# Patient Record
Sex: Female | Born: 1975
Health system: Southern US, Community
[De-identification: ages and names within clinical notes are randomized; demographics above are authoritative.]

## PROBLEM LIST (undated history)

## (undated) DIAGNOSIS — R011 Cardiac murmur, unspecified: Secondary | ICD-10-CM

## (undated) DIAGNOSIS — E785 Hyperlipidemia, unspecified: Secondary | ICD-10-CM

## (undated) DIAGNOSIS — E119 Type 2 diabetes mellitus without complications: Secondary | ICD-10-CM

## (undated) DIAGNOSIS — I509 Heart failure, unspecified: Secondary | ICD-10-CM

## (undated) DIAGNOSIS — E669 Obesity, unspecified: Secondary | ICD-10-CM

## (undated) DIAGNOSIS — D509 Iron deficiency anemia, unspecified: Secondary | ICD-10-CM

## (undated) DIAGNOSIS — I2510 Atherosclerotic heart disease of native coronary artery without angina pectoris: Secondary | ICD-10-CM

## (undated) DIAGNOSIS — I10 Essential (primary) hypertension: Secondary | ICD-10-CM

## (undated) HISTORY — DX: Hyperlipidemia, unspecified: E78.5

## (undated) HISTORY — DX: Type 2 diabetes mellitus without complications: E11.9

## (undated) HISTORY — DX: Essential (primary) hypertension: I10

## (undated) HISTORY — DX: Iron deficiency anemia, unspecified: D50.9

## (undated) HISTORY — DX: Obesity, unspecified: E66.9

## (undated) HISTORY — PX: ORIF FEMUR FRACTURE: SHX2119

---

## 2006-03-04 ENCOUNTER — Ambulatory Visit: Payer: Self-pay | Admitting: Hospitalist

## 2006-03-06 ENCOUNTER — Ambulatory Visit: Payer: Self-pay | Admitting: Internal Medicine

## 2012-08-20 ENCOUNTER — Ambulatory Visit (INDEPENDENT_AMBULATORY_CARE_PROVIDER_SITE_OTHER): Payer: BC Managed Care – PPO | Admitting: Medical

## 2012-08-20 ENCOUNTER — Encounter: Payer: Self-pay | Admitting: Medical

## 2012-08-20 VITALS — BP 118/80 | HR 88 | Temp 98.4°F | Resp 16 | Wt 270.0 lb

## 2012-08-20 DIAGNOSIS — R509 Fever, unspecified: Secondary | ICD-10-CM

## 2012-08-20 DIAGNOSIS — R52 Pain, unspecified: Secondary | ICD-10-CM

## 2012-08-20 DIAGNOSIS — R05 Cough: Secondary | ICD-10-CM

## 2012-08-20 DIAGNOSIS — R062 Wheezing: Secondary | ICD-10-CM

## 2012-08-20 MED ORDER — ALBUTEROL SULFATE HFA 108 (90 BASE) MCG/ACT IN AERS: 2.0000 | INHALATION_SPRAY | Freq: Four times a day (QID) | RESPIRATORY_TRACT | Status: DC | PRN

## 2012-08-20 MED ORDER — LEVOFLOXACIN 500 MG PO TABS: 500.0000 mg | ORAL_TABLET | Freq: Every day | ORAL | Status: DC

## 2012-08-20 MED ORDER — HYDROCODONE-HOMATROPINE 5-1.5 MG/5ML PO SYRP: 5.0000 mL | ORAL_SOLUTION | Freq: Four times a day (QID) | ORAL | Status: DC | PRN

## 2012-08-20 NOTE — Progress Notes (Signed)
Subjective: Here as a new patient for acute illness.  Been sick for 4 days.  She reports feeling hot then cold, head and throat hurts, coughing, lost voice, ears and throat hurt some, body aches, head has stopped hurting, can barely breath out nose, but cough is horrible, hurts back of her throat.  No appetite.  Started out with fever, but not now.   Some vomiting from coughing so hard.  Denies sinus pressure.  Some abdominal pain from coughing.  Denies back pain.  Daughter is sick but she caught it from her.  Used 1 dose of Dayquil, but nothing else.  No other aggravating or relieving factors.  She did not get a flu shot this year.   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  Normally sees Dr. Ocie Bob ,but they couldn't get her in today.  Denies hx/o asthma or lung disease, nonsmoker.  No other c/o.   Objective: Gen: wd, wn, nad, AA obese female, somewhat ill appearing,vitals stable, afebrile Skin: warm, dry Heent: no sinus tenderness, TMs pearly, nares with purulent discharge, pharynx normal Lungs: upper inspiratory wheezes bilat, otherwise decreased breath sounds, no rhonchi or rales Heart: RRR, normal S1, S2, no murmur  Assessment: Encounter Diagnoses  Name Primary?  . Fever Yes  . Wheezing   . Cough   . Body aches     Plan: Symptoms suggest flu like illness, but at this point she may be working towards secondary pulmonary and sinus infection.   Patient Instructions  Begin Mucinex DM or similar generic OTC for mucous and cough.  This shouldn't raise the blood pressure.  Another alternative is Coricidin HBP.  I also prescribed Hycodan cough syrup today for worse cough that you can use at bedtime. Just don't use at the same time as the OTC cough medications.    I prescribed Albuterol inhaler for wheezing, tightness and shortness of breath.  You can use this, 2 puffs every 4- 6 hours if needed.    Rest, drink plenty of fluids such as water, stay out of the cold,  stay away from tobacco smoke.    If you are worse over the weekend, with worse chest congestion, fever, or not improving, then begin antibiotic Levaquin.  If you begin the antibiotic, its one tablet daily for 1 week.    Recheck next week if not improving.

## 2012-08-20 NOTE — Patient Instructions (Signed)
Begin Mucinex DM or similar generic OTC for mucous and cough.  This shouldn't raise the blood pressure.  Another alternative is Coricidin HBP.  I also prescribed Hycodan cough syrup today for worse cough that you can use at bedtime. Just don't use at the same time as the OTC cough medications.    I prescribed Albuterol inhaler for wheezing, tightness and shortness of breath.  You can use this, 2 puffs every 4- 6 hours if needed.    Rest, drink plenty of fluids such as water, stay out of the cold, stay away from tobacco smoke.    If you are worse over the weekend, with worse chest congestion, fever, or not improving, then begin antibiotic Levaquin.  If you begin the antibiotic, its one tablet daily for 1 week.    Recheck next week if not improving.

## 2012-10-08 ENCOUNTER — Other Ambulatory Visit: Payer: Self-pay | Admitting: Medical

## 2012-10-08 NOTE — Telephone Encounter (Signed)
PATIENT NEEDS A OFFICE VISIT TO FOLLOW UP ON BLOOD PRESSURE.

## 2012-10-13 ENCOUNTER — Encounter: Payer: BC Managed Care – PPO | Admitting: Medical

## 2012-10-20 ENCOUNTER — Encounter: Payer: BC Managed Care – PPO | Admitting: Medical

## 2012-11-11 ENCOUNTER — Telehealth: Payer: Self-pay | Admitting: Internal Medicine

## 2012-11-11 NOTE — Telephone Encounter (Signed)
Called pt and left voicemail for pt to call me back also re faxed medical records form

## 2012-11-11 NOTE — Telephone Encounter (Signed)
Noted thanks °

## 2012-11-11 NOTE — Telephone Encounter (Signed)
Message copied by Joslyn Hy on Thu Nov 11, 2012  3:19 PM ------      Message from: KNAPP, EVE      Created: Wed Nov 10, 2012 10:13 PM      Regarding: records       Navarro Nine--I see this pt on my schedule for next week.  She has seen Vincenza Hews once for an acute visit only.  Per his note, she was under are of Dr. Parke Simmers, and only came here because she couldn't get in with her (for a sick visit).  It appears that she called here for refill on BP med in Feb, and was asked to schedule appt.  Looks like she no showed an appt with Vincenza Hews, and now has an appt with me for next week.  Please see if we have received records from Dr. Parke Simmers (as this is essentially an establish care visit with me, not sure why she is on my schedule), and verify that she plans on coming, since she has already no showed a visit at our office.  She is on a cholesterol-lowering medication.  If she hasn't had labs in over 6 months, she might want to come fasting (nothing to eat for 8 hours) vs coming in earlier that morning to have blood drawn while fasting and held until orders placed at her.            Thanks ------

## 2012-11-11 NOTE — Telephone Encounter (Signed)
Dr. Lynelle Doctor medical records were signed back in January and still have not gotten them, i have refaxed them again today. Pt did call me back and she is coming to her appt. She will try to fast until her appt if not she will come in that morning before her appt but she has not had any labs done within the last 6 months anywhere

## 2012-11-17 ENCOUNTER — Ambulatory Visit (INDEPENDENT_AMBULATORY_CARE_PROVIDER_SITE_OTHER): Payer: BC Managed Care – PPO | Admitting: Family Medicine

## 2012-11-17 ENCOUNTER — Encounter: Payer: Self-pay | Admitting: Family Medicine

## 2012-11-17 VITALS — BP 112/72 | HR 76 | Ht 65.0 in | Wt 277.0 lb

## 2012-11-17 DIAGNOSIS — D509 Iron deficiency anemia, unspecified: Secondary | ICD-10-CM

## 2012-11-17 DIAGNOSIS — E7800 Pure hypercholesterolemia, unspecified: Secondary | ICD-10-CM | POA: Insufficient documentation

## 2012-11-17 DIAGNOSIS — Z79899 Other long term (current) drug therapy: Secondary | ICD-10-CM

## 2012-11-17 DIAGNOSIS — I10 Essential (primary) hypertension: Secondary | ICD-10-CM | POA: Insufficient documentation

## 2012-11-17 DIAGNOSIS — Z6841 Body Mass Index (BMI) 40.0 and over, adult: Secondary | ICD-10-CM | POA: Insufficient documentation

## 2012-11-17 LAB — CBC WITH DIFFERENTIAL/PLATELET
Basophils Absolute: 0 10*3/uL (ref 0.0–0.1)
Basophils Relative: 0 % (ref 0–1)
Eosinophils Absolute: 0.1 10*3/uL (ref 0.0–0.7)
Eosinophils Relative: 1 % (ref 0–5)
MCH: 25.8 pg — ABNORMAL LOW (ref 26.0–34.0)
MCHC: 32.4 g/dL (ref 30.0–36.0)
MCV: 79.6 fL (ref 78.0–100.0)
Monocytes Absolute: 0.8 10*3/uL (ref 0.1–1.0)
Monocytes Relative: 17 % — ABNORMAL HIGH (ref 3–12)
Neutro Abs: 1.7 10*3/uL (ref 1.7–7.7)
RBC: 4.57 MIL/uL (ref 3.87–5.11)
RDW: 15.5 % (ref 11.5–15.5)
WBC: 4.7 10*3/uL (ref 4.0–10.5)

## 2012-11-17 MED ORDER — HYDROCHLOROTHIAZIDE 12.5 MG PO CAPS: 12.5000 mg | ORAL_CAPSULE | Freq: Every day | ORAL | Status: DC

## 2012-11-17 MED ORDER — AMLODIPINE BESYLATE 10 MG PO TABS: 10.0000 mg | ORAL_TABLET | Freq: Every day | ORAL | Status: DC

## 2012-11-17 NOTE — Progress Notes (Signed)
Chief Complaint  Patient presents with  . Hypertension    med check, patient is on an 8 hr fast.    Patient presents by med check.  She was here only for an acute visit once in January, and no records were received from her previous physician.  She knows that she hasn't had any labs in over 6 months, and hasn't eaten in the last 8 hrs today.  Hypertension:  She reports being compliant with her Tribenzor, denies side effects.  She recalls having a h/o swelling prior to having diuretic in BP regimen. Doesn't check BP elsewhere.  Can tell if BP is high, like if she forgets to take her meds. Denies headaches or dizziness (some stress-related headaches).  Denies chest pain (had some HA and chest pain when very stressed at work).  Denies any exertional chest pain.  Currently denies headache, chest pain, palpitations, edema.  Going to the [employer] 1-3x/week, walks on treadmill x 40 minutes and sometimes Zumba.  Hyperlipidemia: Admits to forgetting to take the pravastatin about 3x/week (due to forgetting to take evening pills).  She cut back on her french fries to just once/week.  Eating 2 hard boiled eggs 2-3x/week.  Has recently made some improvements to her diet.  Was told she had iron deficiency anemia.  Taking gummy MVI with iron.  She is able to tolerate this, but not a separate iron OTC tablet (due to constipation, and trouble swallowing the tablets).  She is not currently using any contraception. Her husband is in [place] with his family until July.  He has never had children, and they would like to get pregnant.  Past Medical History  Diagnosis Date  . Hypertension age 40  . Obesity   . Hyperlipidemia age 12  . Iron deficiency anemia     Past Surgical History  Procedure Laterality Date  . Orif femur fracture    . Cesarean section  3/96    History   Social History  . Marital Status: Single    Spouse Name: N/A    Number of Children: N/A  . Years of Education: N/A   Occupational  History  . Not on file.   Social History Main Topics  . Smoking status: Never Smoker   . Smokeless tobacco: Never Used  . Alcohol Use: No  . Drug Use: No  . Sexually Active: Not Currently -- Female partner(s)     Comment: husband currently in [place], returning 02/2013   Other Topics Concern  . 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  Lives with 76 year old daughter, 1 cat, husband    Family History  Problem Relation Age of Onset  . Heart disease Mother     CABG <50  . Hypertension Mother   . Diabetes Mother   . Hyperlipidemia Mother   . Heart disease Father   . Hyperlipidemia Father   . Hypertension Father   . Early death Father   . Healthy Daughter   . Diabetes Maternal Aunt   . Cancer Maternal Grandmother     stomach cancer  . Diabetes Maternal Aunt   . Cancer Cousin     colon cancer (dx'd 40)   Current Outpatient Prescriptions on File Prior to Visit  Medication Sig Dispense Refill  . pravastatin (PRAVACHOL) 20 MG tablet Take 20 mg by mouth daily.      Marland Kitchen albuterol (PROVENTIL HFA) 108 (  90 BASE) MCG/ACT inhaler Inhale 2 puffs into the lungs every 6 (six) hours as needed for wheezing.  1 Inhaler  0   No current facility-administered medications on file prior to visit.   TriBenzor 20/5/12.5 mg qd  Allergies  Allergen Reactions  . Penicillins Other (See Comments)    historical   ROS:  Denies headaches, fevers, URI symptoms, cough, shortness of breath, nausea, vomiting, bowel changes, urinary complaints, edema, bleeding/bruising, skin rashes, depression, or other complaints.  No myalgias, joint pains  PHYSICAL EXAM: BP 112/72  Pulse 76  Ht 5\' 5"  (1.651 m)  Wt 277 lb (125.646 kg)  BMI 46.1 kg/m2 Pleasant, obese female in no distress HEENT:  Conjunctiva clear. PERRL, EOMI Neck: no lymphadenopathy, thyromegaly or mass Heart: regular rate and rhythm without murmur Lungs: clear  bilaterally Abdomen: obese, soft, nontender, no mass Extremities: no edema, 2+ pulse Skin: no rash Psych: normal mood, affect, hygiene and grooming Neuro: alert and oriented, normal gait, cranial nerves intact  ASSESSMENT/PLAN: Pure hypercholesterolemia - Plan: Comprehensive metabolic panel, Lipid panel  Encounter for long-term (current) use of other medications  Essential hypertension, benign - Plan: Comprehensive metabolic panel, amLODipine (NORVASC) 10 MG tablet, hydrochlorothiazide (MICROZIDE) 12.5 MG capsule  Anemia, iron deficiency - Plan: CBC with Differential, Ferritin  Morbid obesity with BMI of 45.0-49.9, adult   Due to desired pregnancy when husband returns in July, we discussed need to change her medications.Stop TriBenzor. Change to amlodipine 10mg  once daily and HCTZ 12.5 once daily (due to issues with edema in past).  Check BP's, write down.  Call if having dizziness. Ensure getting adequate folic acid from MVI Discussed contraindication for pregnancy while on ARB and statins  Continue the pravastatin (if labs at goal) for now, until husband returns.  Once he returns, needs to either stop, or switch to [employer].   Need to review prior records to see how high lipids were, and see current results (recognizing that she is missing med 1/2 time).  Consider giving trial OFF medications, on low cholesterol diet, rechecking lipids, and then determining if Welchol needs to be started.  If lipids okay, and missing 3x/week, then can try going off meds once husband returns. Okay to take statin in morning if tolerates  She had questions regarding weight loss meds at the end of her visit.  Declined starting these today.  Briefly reviewed risks of elevated BP's with some appetite suppressants, and contraindications for use in pregnancy (so if used, could only be very short term).  Discussed diet, exercise. Consider referral to nutritionist at her f/u visit.  Will see how she does based on  brief counseling done today. I recommended nutritionist to learn about healthy eating habits to stay with her for her lifetime, rather than a medication, where once stopped, weight may be regained.  She is in agreement about this. Try and increase exercise to at least 30-45 mins daily (she thinks she can exercise more while at her night job).  25-30 minute visit, more than 1/2 spent counseling

## 2012-11-17 NOTE — Patient Instructions (Addendum)
Stop TriBenzor (because you do not want to be on this medication when you get pregnant). Change to amlodipine 10mg  once daily and HCTZ 12.5 once daily--you can take both of these medications together in the morning.  Periodically check BP elsewhere, write down, and bring list to your next visit.    We will let you know if you should continue your pravastatin (and take it in the morning so you don't forget) until your husband returns, or if we need to change it.  You should NOT be on this medication when trying for pregnancy.  Depending on the results, and your old records, we will need to decide if we can just stop the medication, vs change to another medication that is safe during pregnancy.  Continue low cholesterol diet.  Need to have folic acid 0.8 mg ( ) once daily in your vitamin once trying for pregnancy   Fat and Cholesterol Control Diet Cholesterol levels in your body are determined significantly by your diet. Cholesterol levels may also be related to heart disease. The following material helps to explain this relationship and discusses what you can do to help keep your heart healthy. Not all cholesterol is bad. Low-density lipoprotein (LDL) cholesterol is the "bad" cholesterol. It may cause fatty deposits to build up inside your arteries. High-density lipoprotein (HDL) cholesterol is "good." It helps to remove the "bad" LDL cholesterol from your blood. Cholesterol is a very important risk factor for heart disease. Other risk factors are high blood pressure, smoking, stress, heredity, and weight. The heart muscle gets its supply of blood through the coronary arteries. If your LDL cholesterol is high and your HDL cholesterol is low, you are at risk for having fatty deposits build up in your coronary arteries. This leaves less room through which blood can flow. Without sufficient blood and oxygen, the heart muscle cannot function properly and you may feel chest pains (angina pectoris). When a  coronary artery closes up entirely, a part of the heart muscle may die causing a heart attack (myocardial infarction). CHECKING CHOLESTEROL When your caregiver sends your blood to a lab to be examined for cholesterol, a complete lipid (fat) profile may be done. With this test, the total amount of cholesterol and levels of LDL and HDL are determined. Triglycerides are a type of fat that circulates in the blood. They can also be used to determine heart disease risk. The list below describes what the numbers should be: Test: Total Cholesterol.  Less than 200 mg/dl. Test: LDL "bad cholesterol."  Less than 100 mg/dl.  Less than 70 mg/dl if you are at very high risk of a heart attack or sudden cardiac death. Test: HDL "good cholesterol."  Greater than 50 mg/dl for women.  Greater than 40 mg/dl for men. Test: Triglycerides.  Less than 150 mg/dl. CONTROLLING CHOLESTEROL WITH DIET Although exercise and lifestyle factors are important, your diet is key. That is because certain foods are known to raise cholesterol and others to lower it. The goal is to balance foods for their effect on cholesterol and more importantly, to replace saturated and trans fat with other types of fat, such as monounsaturated fat, polyunsaturated fat, and omega-3 fatty acids. On average, a person should consume no more than 15 to 17 g of saturated fat daily. Saturated and trans fats are considered "bad" fats, and they will raise LDL cholesterol. Saturated fats are primarily found in animal products such as meats, butter, and cream. However, that does not mean you need to  give up all your favorite foods. Today, there are good tasting, low-fat, low-cholesterol substitutes for most of the things you like to eat. Choose low-fat or nonfat alternatives. Choose round or loin cuts of red meat. These types of cuts are lowest in fat and cholesterol. Chicken (without the skin), fish, veal, and ground [place] breast are great choices.  Eliminate fatty meats, such as hot dogs and salami. Even shellfish have little or no saturated fat. Have a 3 oz (85 g) portion when you eat lean meat, poultry, or fish. Trans fats are also called "partially hydrogenated oils." They are oils that have been scientifically manipulated so that they are solid at room temperature resulting in a longer shelf life and improved taste and texture of foods in which they are added. Trans fats are found in stick margarine, some tub margarines, cookies, crackers, and baked goods.  When baking and cooking, oils are a great substitute for butter. The monounsaturated oils are especially beneficial since it is believed they lower LDL and raise HDL. The oils you should avoid entirely are saturated tropical oils, such as coconut and palm.  Remember to eat a lot from food groups that are naturally free of saturated and trans fat, including fish, fruit, vegetables, beans, grains (barley, rice, couscous, bulgur wheat), and pasta (without cream sauces).  IDENTIFYING FOODS THAT LOWER CHOLESTEROL  Soluble fiber may lower your cholesterol. This type of fiber is found in fruits such as apples, vegetables such as broccoli, potatoes, and carrots, legumes such as beans, peas, and lentils, and grains such as barley. Foods fortified with plant sterols (phytosterol) may also lower cholesterol. You should eat at least 2 g per day of these foods for a cholesterol lowering effect.  Read package labels to identify low-saturated fats, trans fat free, and low-fat foods at the supermarket. Select cheeses that have only 2 to 3 g saturated fat per ounce. Use a heart-healthy tub margarine that is free of trans fats or partially hydrogenated oil. When buying baked goods (cookies, crackers), avoid partially hydrogenated oils. Breads and muffins should be made from whole grains (whole-wheat or whole oat flour, instead of "flour" or "enriched flour"). Buy non-creamy canned soups with reduced salt and no  added fats.  FOOD PREPARATION TECHNIQUES  Never deep-fry. If you must fry, either stir-fry, which uses very little fat, or use non-stick cooking sprays. When possible, broil, bake, or roast meats, and steam vegetables. Instead of putting butter or margarine on vegetables, use lemon and herbs, applesauce, and cinnamon (for squash and sweet potatoes), nonfat yogurt, salsa, and low-fat dressings for salads.  LOW-SATURATED FAT / LOW-FAT FOOD SUBSTITUTES Meats / Saturated Fat (g)  Avoid: Steak, marbled (3 oz/85 g) / 11 g  Choose: Steak, lean (3 oz/85 g) / 4 g  Avoid: Hamburger (3 oz/85 g) / 7 g  Choose: Hamburger, lean (3 oz/85 g) / 5 g  Avoid: Ham (3 oz/85 g) / 6 g  Choose: Ham, lean cut (3 oz/85 g) / 2.4 g  Avoid: Chicken, with skin, dark meat (3 oz/85 g) / 4 g  Choose: Chicken, skin removed, dark meat (3 oz/85 g) / 2 g  Avoid: Chicken, with skin, light meat (3 oz/85 g) / 2.5 g  Choose: Chicken, skin removed, light meat (3 oz/85 g) / 1 g Dairy / Saturated Fat (g)  Avoid: Whole milk (1 cup) / 5 g  Choose: Low-fat milk, 2% (1 cup) / 3 g  Choose: Low-fat milk, 1% (1 cup) / 1.5  g  Choose: Skim milk (1 cup) / 0.3 g  Avoid: Hard cheese (1 oz/28 g) / 6 g  Choose: Skim milk cheese (1 oz/28 g) / 2 to 3 g  Avoid: Cottage cheese, 4% fat (1 cup) / 6.5 g  Choose: Low-fat cottage cheese, 1% fat (1 cup) / 1.5 g  Avoid: Ice cream (1 cup) / 9 g  Choose: Sherbet (1 cup) / 2.5 g  Choose: Nonfat frozen yogurt (1 cup) / 0.3 g  Choose: Frozen fruit bar / trace  Avoid: Whipped cream (1 tbs) / 3.5 g  Choose: Nondairy whipped topping (1 tbs) / 1 g Condiments / Saturated Fat (g)  Avoid: Mayonnaise (1 tbs) / 2 g  Choose: Low-fat mayonnaise (1 tbs) / 1 g  Avoid: Butter (1 tbs) / 7 g  Choose: Extra light margarine (1 tbs) / 1 g  Avoid: Coconut oil (1 tbs) / 11.8 g  Choose: Olive oil (1 tbs) / 1.8 g  Choose: Corn oil (1 tbs) / 1.7 g  Choose: Safflower oil (1 tbs) / 1.2  g  Choose: Sunflower oil (1 tbs) / 1.4 g  Choose: Soybean oil (1 tbs) / 2.4 g  Choose: Canola oil (1 tbs) / 1 g Document Released: 07/28/2005 Document Revised: 10/20/2011 Document Reviewed: 01/16/2011 [facility] Patient Information 2013 [place], [place].

## 2012-11-18 LAB — LIPID PANEL
LDL Cholesterol: 140 mg/dL — ABNORMAL HIGH (ref 0–99)
Total CHOL/HDL Ratio: 4.8 Ratio
VLDL: 10 mg/dL (ref 0–40)

## 2012-11-18 LAB — COMPREHENSIVE METABOLIC PANEL
ALT: 45 U/L — ABNORMAL HIGH (ref 0–35)
AST: 33 U/L (ref 0–37)
Chloride: 101 mEq/L (ref 96–112)
Creat: 0.65 mg/dL (ref 0.50–1.10)
Total Bilirubin: 0.4 mg/dL (ref 0.3–1.2)

## 2012-11-22 ENCOUNTER — Other Ambulatory Visit: Payer: Self-pay | Admitting: *Deleted

## 2012-11-22 DIAGNOSIS — E7800 Pure hypercholesterolemia, unspecified: Secondary | ICD-10-CM

## 2012-11-22 MED ORDER — PRAVASTATIN SODIUM 20 MG PO TABS: 20.0000 mg | ORAL_TABLET | Freq: Every day | ORAL | Status: DC

## 2012-11-24 ENCOUNTER — Telehealth: Payer: Self-pay | Admitting: *Deleted

## 2012-11-24 NOTE — Telephone Encounter (Signed)
Typed note for patient, she will come and pick up. She will increase her HCTZ to 25mg  and she is now scheduled next Wed 12/01/12 @ 3:45pm for bp follow up.

## 2012-11-24 NOTE — Telephone Encounter (Signed)
Patient called and stated that after our telephone call on Monday she did have her bp checked and it was 160/120. Her job would not let her work Monday or Tuesday night. She stayed home and put her feet up. Her job is requesting a note for these days. She also mentioned to me that she is bringing FMLA paperwork with her to her next visit with you, wasn't sure if you were aware of this.

## 2012-11-24 NOTE — Telephone Encounter (Signed)
Okay for note due to days job wouldn't let her work.  Have her increase her HCTZ to 25 mg daily, and continue amlodipine 10mg  daily.  Recommend she return next week for f/u on BP

## 2012-12-01 ENCOUNTER — Ambulatory Visit: Payer: Self-pay | Admitting: Family Medicine

## 2012-12-13 ENCOUNTER — Other Ambulatory Visit: Payer: Self-pay | Admitting: Family Medicine

## 2012-12-15 ENCOUNTER — Ambulatory Visit: Payer: BC Managed Care – PPO | Admitting: Family Medicine

## 2012-12-16 ENCOUNTER — Ambulatory Visit: Payer: Self-pay | Admitting: Family Medicine

## 2012-12-20 ENCOUNTER — Ambulatory Visit (INDEPENDENT_AMBULATORY_CARE_PROVIDER_SITE_OTHER): Payer: BC Managed Care – PPO | Admitting: Family Medicine

## 2012-12-20 ENCOUNTER — Encounter: Payer: Self-pay | Admitting: Family Medicine

## 2012-12-20 VITALS — BP 120/88 | HR 80 | Ht 65.0 in | Wt 277.0 lb

## 2012-12-20 DIAGNOSIS — I10 Essential (primary) hypertension: Secondary | ICD-10-CM

## 2012-12-20 DIAGNOSIS — E7800 Pure hypercholesterolemia, unspecified: Secondary | ICD-10-CM

## 2012-12-20 DIAGNOSIS — Z6841 Body Mass Index (BMI) 40.0 and over, adult: Secondary | ICD-10-CM

## 2012-12-20 DIAGNOSIS — Z79899 Other long term (current) drug therapy: Secondary | ICD-10-CM

## 2012-12-20 LAB — BASIC METABOLIC PANEL
BUN: 16 mg/dL (ref 6–23)
Creat: 0.66 mg/dL (ref 0.50–1.10)
Glucose, Bld: 123 mg/dL — ABNORMAL HIGH (ref 70–99)
Potassium: 3.7 mEq/L (ref 3.5–5.3)

## 2012-12-20 MED ORDER — HYDROCHLOROTHIAZIDE 25 MG PO TABS: 25.0000 mg | ORAL_TABLET | Freq: Every day | ORAL | Status: DC

## 2012-12-20 NOTE — Patient Instructions (Addendum)
  Your blood pressure is elevated here in the office today.  It was lower when the nurse first checked it, higher when doctor checked it.  This makes me suspect there is a component of anxiety or "white coat" phenomenon.  I am going to continue your same medications for now, but want you to limit the sodium in your diet (look at labels, less then 2500 mg daily of sodium); check your blood pressure at least once a week at the pharmacy, write down, and in 2-3 weeks (before you need a refill on your medication) call or fax [place] the list of your blood pressures.  If the blood pressures are all <140/90, then we will refill the medication.  If they are frequently higher, then a new medication will need to be added.  Try and exercise at least 30 minutes daily, and lose weight.

## 2012-12-20 NOTE — Progress Notes (Signed)
Chief Complaint  Patient presents with  . Hypertension    bp follow up. Has FMLA paperwork with her today. Complains fo muscle cramping, wonders if she should take potassium supplement.   F/u HTN--Pt's meds were changed in anticipation of attempting pregnancy, to get her off [employer] (once husband returns this summer).  After changing meds, she called 1 week later stating that BP's were very high, and that her job didn't let her come to work for 2 days.  She was written a note for those 2 days, her HCTZ dose was increased, and she was due to follow up the following week (4/23).  She has no-showed multiple visits, sometimes calling 45 minutes after visit time because she overslept visit.  She finally returns today for follow-up, arriving 15 minutes late for her appt.  The dose of HCTZ was increased to 25mg   2-3 weeks ago.  She hasn't been checking her BP's elsewhere, hasn't checked BP since dose was increased.  Had a headache last week, and having some cramps in her legs and stomach.  Got a cramp in her stomach after coughing.  Denies chest pain or shortness of breath.  Had some chest pain about a week ago, left from work, and symptoms resolved. She brings in FMLA forms today (to cover for days that she leaves when not feeling well).  She is also worried about the frequency of urination affecting her productivity, as more frequent bathroom breaks are needed.  She is taking her meds in the afternoon, after waking up.  Taking it daily, not forgetting to take the pravastatin as she was before (at time of last labs, she admitted some noncompliance with statin). She denies muscle aches/side effects from the statin, but is complaining about some cramps.  Doesn't eat bananas daily, causes gas.  Past Medical History  Diagnosis Date  . Hypertension age 37  . Obesity   . Hyperlipidemia age 37  . Iron deficiency anemia    Past Surgical History  Procedure Laterality Date  . Orif femur fracture    . Cesarean section   3/96   History   Social History  . Marital Status: Single    Spouse Name: N/A    Number of Children: N/A  . Years of Education: N/A   Occupational History  . Not on file.   Social History Main Topics  . Smoking status: Never Smoker   . Smokeless tobacco: Never Used  . Alcohol Use: No  . Drug Use: No  . Sexually Active: Not Currently -- Female partner(s)     Comment: husband currently in [place], returning 02/2013   Other Topics Concern  . 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  Lives with 72 year old daughter, 1 cat, husband   Current outpatient prescriptions:amLODipine (NORVASC) 10 MG tablet, Take 1 tablet (10 mg total) by mouth daily., Disp: 30 tablet, Rfl: 1;  Multiple Vitamins-Minerals (MULTIVITAMIN GUMMIES ADULT PO), Take 1 each by mouth daily., Disp: , Rfl: ;  pravastatin (PRAVACHOL) 20 MG tablet, Take 1 tablet (20 mg total) by mouth daily., Disp: 30 tablet, Rfl: 2 albuterol (PROVENTIL HFA) 108 (90 BASE) MCG/ACT inhaler, Inhale 2 puffs into the lungs every 6 (six) hours as needed for wheezing., Disp: 1 Inhaler, Rfl: 0;  hydrochlorothiazide (HYDRODIURIL) 25 MG tablet, Take 1 tablet (25 mg total) by mouth daily., Disp: 30 tablet, Rfl: 1  Allergies  Allergen  Reactions  . Penicillins Other (See Comments)    historical    ROS:  Denies fevers, URI symptoms, shortness of breath, cough.  Occasional headache, chest pain as per HPI.  Denies edema.  +muscle cramps.  No nausea, vomiting, diarrhea, bleeding, skin rashes, depression.  PHYSICAL EXAM: BP 120/88  Pulse 80  Ht 5\' 5"  (1.651 m)  Wt 277 lb (125.646 kg)  BMI 46.1 kg/m2  LMP 11/17/2012 150/94 on repeat by MD, RA; LA:154/96 Well developed, obese female in no distress HEENT:  Conjunctiva clear.  OP clear Neck: no lymphadenopathy, thyromegaly or bruit Heart: regular rate and rhythm without murmur Lungs: clear bilaterally Extremities: no edema Psych:  normal mood, affect, hygiene and grooming    Chemistry      Component Value Date/Time   NA 137 11/17/2012 1722   K 3.8 11/17/2012 1722   CL 101 11/17/2012 1722   CO2 28 11/17/2012 1722   BUN 12 11/17/2012 1722   CREATININE 0.65 11/17/2012 1722      Component Value Date/Time   CALCIUM 9.7 11/17/2012 1722   ALKPHOS 74 11/17/2012 1722   AST 33 11/17/2012 1722   ALT 45* 11/17/2012 1722   BILITOT 0.4 11/17/2012 1722     Lab Results  Component Value Date   CHOL 189 11/17/2012   HDL 39* 11/17/2012   LDLCALC 140* 11/17/2012   TRIG 49 11/17/2012   CHOLHDL 4.8 11/17/2012    ASSESSMENT/PLAN:  Essential hypertension, benign - ?control--needs to check regularly at pharmacy/home.  Low sodium diet, daily exercise, weight loss - Plan: Basic metabolic panel, hydrochlorothiazide (HYDRODIURIL) 25 MG tablet  Encounter for long-term (current) use of other medications - having some muscle cramps since HCTZ dose was increased.  check b-met today  - Plan: Basic metabolic panel  Morbid obesity with BMI of 45.0-49.9, adult  HTN--BP improved per nurse check, elevated upon MD recheck.  Hasn't been checking BP elsewhere, so ? Control overall.   Reviewed low sodium diet (she admits to frozen foods), portion control, daily exercise and weight loss.  Monitor BP elsewhere at least 1-2x weekly, and record.  Needs to call/fax BP results prior to refilling meds next month.  Discussed FMLA paperwork--that HTN isn't really a diagnosis requiring FMLA; she shouldn't have any missed work other than for OV's, and she hasn't missed work for these.  I wrote her out for the days that her job wouldn't allow her to come, after the fact, when she had already missed work.  She should NOT need to miss work in future for asymptomatic elevations in BP's, or need it to cover her to go home when she "isn't feeling well" last week, without OV to eval her symptoms. b-met  Info to pt: Your blood pressure is elevated here in the office today.  It was lower  when the nurse first checked it, higher when doctor checked it.  This makes me suspect there is a component of anxiety or "white coat" phenomenon.  I am going to continue your same medications for now, but want you to limit the sodium in your diet (look at labels, less then 2500 mg daily of sodium); check your blood pressure at least once a week at the pharmacy, write down, and in 2-3 weeks (before you need a refill on your medication) call or fax [place] the list of your blood pressures.  If the blood pressures are all <140/90, then we will refill the medication.  If they are frequently higher, then a new medication  will need to be added.  25 minute visit, more than 1/2 spent counseling

## 2012-12-21 ENCOUNTER — Encounter: Payer: Self-pay | Admitting: Family Medicine

## 2013-01-22 ENCOUNTER — Other Ambulatory Visit: Payer: Self-pay | Admitting: Family Medicine

## 2013-02-16 ENCOUNTER — Institutional Professional Consult (permissible substitution): Payer: BC Managed Care – PPO | Admitting: Family Medicine

## 2013-03-02 ENCOUNTER — Institutional Professional Consult (permissible substitution): Payer: BC Managed Care – PPO | Admitting: Family Medicine

## 2013-03-19 ENCOUNTER — Other Ambulatory Visit: Payer: Self-pay | Admitting: Family Medicine

## 2013-04-07 ENCOUNTER — Ambulatory Visit: Payer: BC Managed Care – PPO | Admitting: Family Medicine

## 2013-04-14 ENCOUNTER — Telehealth: Payer: Self-pay | Admitting: *Deleted

## 2013-04-14 ENCOUNTER — Other Ambulatory Visit: Payer: Self-pay | Admitting: Family Medicine

## 2013-04-14 NOTE — Telephone Encounter (Signed)
Deny.  She is long past due for OV.  She canceled one for last week, and has multiple no shows (has she ever been sent warning letter re: no shows? If not, we need to).  She can have quantity refilled ONLY until scheduled appt (ie if appt in 1 week, only give 1 week supply), and if she no shows, she will be discharged.

## 2013-04-14 NOTE — Telephone Encounter (Signed)
Called patient and scheduled her for follow up on 04/21/13, called in #7 of her HCTZ and let her know that if she no showed this appointment Dr.Knapp would no longer be able to provide her medical services. Patient verbalized understanding.

## 2013-04-14 NOTE — Telephone Encounter (Signed)
Is this okay?

## 2013-04-15 ENCOUNTER — Ambulatory Visit (INDEPENDENT_AMBULATORY_CARE_PROVIDER_SITE_OTHER): Payer: BC Managed Care – PPO | Admitting: Family Medicine

## 2013-04-15 VITALS — BP 118/80 | HR 82 | Wt 268.0 lb

## 2013-04-15 DIAGNOSIS — R51 Headache: Secondary | ICD-10-CM

## 2013-04-15 DIAGNOSIS — R079 Chest pain, unspecified: Secondary | ICD-10-CM

## 2013-04-15 NOTE — Progress Notes (Signed)
  Subjective:    Patient ID: Tiffany Le, female    DOB: 1976-02-06, 37 y.o.   MRN: [id number]  HPI Is a two-day history of headache that is sharp in nature and in the mid head area. She also describes blurred vision. She has not tried any medication. This morning she noted the onset of intermittent chest pain in the midepigastric area. She states that she has pain every 30 minutes and lasts 2 minutes. No SOB , diaphoresis or fatigue. He cannot relate this to food or movement. She continues on medications listed in the chart. She has been under a lot of stress in the last month and especially in the last several days with issues surrounding her husband. She also admits to not sleeping well Review of Systems     Objective:   Physical Exam alert and in no distress. Tympanic membranes and canals are normal. Throat is clear. Tonsils are normal. Neck is supple without adenopathy or thyromegaly. Cardiac exam shows a regular sinus rhythm without murmurs or gallops. Lungs are clear to auscultation. No palpable tenderness to her scalp. Abdominal exam shows no masses or tenderness to      Assessment & Plan:  Headache(784.0)  Chest pain  I explained that her headache and chest pain are more likely than not related to stress as she is under. Strongly encouraged her to use the serenity prayer to deal with the issues. She seems to be stressing of her things she has no control over. Recommended NSAID of choice. She did mention early on that she has had some breast tenderness with bleeding but plans to see Dr. Lynelle Doctor for this next week.

## 2013-04-15 NOTE — Patient Instructions (Signed)
Take Advil or Aleve for the headache . Youcan take 4 Advil 3 times per day or 3 Aleve twice a day. Can use Tylenol PM to help with sleep for a couple of days Use the serenity prayer

## 2013-04-21 ENCOUNTER — Encounter: Payer: Self-pay | Admitting: Family Medicine

## 2013-04-21 ENCOUNTER — Ambulatory Visit (INDEPENDENT_AMBULATORY_CARE_PROVIDER_SITE_OTHER): Payer: BC Managed Care – PPO | Admitting: Family Medicine

## 2013-04-21 VITALS — BP 160/94 | HR 80 | Ht 65.0 in | Wt 271.0 lb

## 2013-04-21 DIAGNOSIS — M545 Low back pain: Secondary | ICD-10-CM

## 2013-04-21 DIAGNOSIS — I10 Essential (primary) hypertension: Secondary | ICD-10-CM

## 2013-04-21 DIAGNOSIS — E7800 Pure hypercholesterolemia, unspecified: Secondary | ICD-10-CM

## 2013-04-21 MED ORDER — COLESEVELAM HCL 3.75 G PO PACK: 1.0000 | PACK | Freq: Every day | ORAL | Status: DC

## 2013-04-21 MED ORDER — HYDROCHLOROTHIAZIDE 25 MG PO TABS: ORAL_TABLET | ORAL | Status: DC

## 2013-04-21 NOTE — Patient Instructions (Addendum)
I recommend starting prenatal vitamins (to have on board in case you become pregnant). Start the Welchol--put the powder in 4-6 ounces of orange juice, and take once daily.  This is for your cholesterol, in place of the pravastatin (which you don't want to take if there is the chance of becoming pregnant).   Sit in a supportive chair.  Add a lumbar support (pillow), if needed.  Try regular stretching, strengthening exercises (consider yoga, pilates)  Below are some exercises that will help (I do not think you truly strained your back).  For all exercises--hold 10 seconds, repeat 5-10 times, do twice daily.  Low Back Strain with Rehab A strain is an injury in which a tendon or muscle is torn. The muscles and tendons of the lower back are vulnerable to strains. However, these muscles and tendons are very strong and require a great force to be injured. Strains are classified into three categories. Grade 1 strains cause pain, but the tendon is not lengthened. Grade 2 strains include a lengthened ligament, due to the ligament being stretched or partially ruptured. With grade 2 strains there is still function, although the function may be decreased. Grade 3 strains involve a complete tear of the tendon or muscle, and function is usually impaired. SYMPTOMS   Pain in the lower back.  Pain that affects one side more than the other.  Pain that gets worse with movement and may be felt in the hip, buttocks, or back of the thigh.  Muscle spasms of the muscles in the back.  Swelling along the muscles of the back.  Loss of strength of the back muscles.  Crackling sound (crepitation) when the muscles are touched. CAUSES  Lower back strains occur when a force is placed on the muscles or tendons that is greater than they can handle. Common causes of injury include:  Prolonged overuse of the muscle-tendon units in the lower back, usually from incorrect posture.  A single violent injury or force applied to  the back. RISK INCREASES WITH:  Sports that involve twisting forces on the spine or a lot of bending at the waist (football, rugby, weightlifting, bowling, golf, tennis, speed skating, racquetball, swimming, running, gymnastics, diving).  Poor strength and flexibility.  Failure to warm up properly before activity.  Family history of lower back pain or disk disorders.  Previous back injury or surgery (especially fusion).  Poor posture with lifting, especially heavy objects.  Prolonged sitting, especially with poor posture. PREVENTION   Learn and use proper posture when sitting or lifting (maintain proper posture when sitting, lift using the knees and legs, not at the waist).  Warm up and stretch properly before activity.  Allow for adequate recovery between workouts.  Maintain physical fitness:  Strength, flexibility, and endurance.  Cardiovascular fitness. PROGNOSIS  If treated properly, lower back strains usually heal within 6 weeks. RELATED COMPLICATIONS   Recurring symptoms, resulting in a chronic problem.  Chronic inflammation, scarring, and partial muscle-tendon tear.  Delayed healing or resolution of symptoms.  Prolonged disability. TREATMENT  Treatment first involves the use of ice and medicine, to reduce pain and inflammation. The use of strengthening and stretching exercises may help reduce pain with activity. These exercises may be performed at home or with a therapist. Severe injuries may require referral to a therapist for further evaluation and treatment, such as ultrasound. Your caregiver may advise that you wear a back brace or corset, to help reduce pain and discomfort. Often, prolonged bed rest results in  greater harm then benefit. Corticosteroid injections may be recommended. However, these should be reserved for the most serious cases. It is important to avoid using your back when lifting objects. At night, sleep on your back on a firm mattress with a  pillow placed under your knees. If non-surgical treatment is unsuccessful, surgery may be needed.  MEDICATION   If pain medicine is needed, nonsteroidal anti-inflammatory medicines (aspirin and ibuprofen), or other minor pain relievers (acetaminophen), are often advised.  Do not take pain medicine for 7 days before surgery.  Prescription pain relievers may be given, if your caregiver thinks they are needed. Use only as directed and only as much as you need.  Ointments applied to the skin may be helpful.  Corticosteroid injections may be given by your caregiver. These injections should be reserved for the most serious cases, because they may only be given a certain number of times. HEAT AND COLD  Cold treatment (icing) should be applied for 10 to 15 minutes every 2 to 3 hours for inflammation and pain, and immediately after activity that aggravates your symptoms. Use ice packs or an ice massage.  Heat treatment may be used before performing stretching and strengthening activities prescribed by your caregiver, physical therapist, or athletic trainer. Use a heat pack or a warm water soak. SEEK MEDICAL CARE IF:   Symptoms get worse or do not improve in 2 to 4 weeks, despite treatment.  You develop numbness, weakness, or loss of bowel or bladder function.  New, unexplained symptoms develop. (Drugs used in treatment may produce side effects.) EXERCISES  RANGE OF MOTION (ROM) AND STRETCHING EXERCISES - Low Back Strain Most people with lower back pain will find that their symptoms get worse with excessive bending forward (flexion) or arching at the lower back (extension). The exercises which will help resolve your symptoms will focus on the opposite motion.  Your physician, physical therapist or athletic trainer will help you determine which exercises will be most helpful to resolve your lower back pain. Do not complete any exercises without first consulting with your caregiver. Discontinue any  exercises which make your symptoms worse until you speak to your caregiver.  If you have pain, numbness or tingling which travels down into your buttocks, leg or foot, the goal of the therapy is for these symptoms to move closer to your back and eventually resolve. Sometimes, these leg symptoms will get better, but your lower back pain may worsen. This is typically an indication of progress in your rehabilitation. Be very alert to any changes in your symptoms and the activities in which you participated in the 24 hours prior to the change. Sharing this information with your caregiver will allow him/her to most efficiently treat your condition.  These exercises may help you when beginning to rehabilitate your injury. Your symptoms may resolve with or without further involvement from your physician, physical therapist or athletic trainer. While completing these exercises, remember:  Restoring tissue flexibility helps normal motion to return to the joints. This allows healthier, less painful movement and activity.  An effective stretch should be held for at least 30 seconds.  A stretch should never be painful. You should only feel a gentle lengthening or release in the stretched tissue. FLEXION RANGE OF MOTION AND STRETCHING EXERCISES: STRETCH  Flexion, Single Knee to Chest   Lie on a firm bed or floor with both legs extended in front of you.  Keeping one leg in contact with the floor, bring your opposite knee  to your chest. Hold your leg in place by either grabbing behind your thigh or at your knee.  Pull until you feel a gentle stretch in your lower back. Hold __________ seconds.  Slowly release your grasp and repeat the exercise with the opposite side. Repeat __________ times. Complete this exercise __________ times per day.  STRETCH  Flexion, Double Knee to Chest   Lie on a firm bed or floor with both legs extended in front of you.  Keeping one leg in contact with the floor, bring your  opposite knee to your chest.  Tense your stomach muscles to support your back and then lift your other knee to your chest. Hold your legs in place by either grabbing behind your thighs or at your knees.  Pull both knees toward your chest until you feel a gentle stretch in your lower back. Hold __________ seconds.  Tense your stomach muscles and slowly return one leg at a time to the floor. Repeat __________ times. Complete this exercise __________ times per day.  STRETCH  Low Trunk Rotation  Lie on a firm bed or floor. Keeping your legs in front of you, bend your knees so they are both pointed toward the ceiling and your feet are flat on the floor.  Extend your arms out to the side. This will stabilize your upper body by keeping your shoulders in contact with the floor.  Gently and slowly drop both knees together to one side until you feel a gentle stretch in your lower back. Hold for __________ seconds.  Tense your stomach muscles to support your lower back as you bring your knees back to the starting position. Repeat the exercise to the other side. Repeat __________ times. Complete this exercise __________ times per day  EXTENSION RANGE OF MOTION AND FLEXIBILITY EXERCISES: STRETCH  Extension, Prone on Elbows   Lie on your stomach on the floor, a bed will be too soft. Place your palms about shoulder width apart and at the height of your head.  Place your elbows under your shoulders. If this is too painful, stack pillows under your chest.  Allow your body to relax so that your hips drop lower and make contact more completely with the floor.  Hold this position for __________ seconds.  Slowly return to lying flat on the floor. Repeat __________ times. Complete this exercise __________ times per day.  RANGE OF MOTION  Extension, Prone Press Ups  Lie on your stomach on the floor, a bed will be too soft. Place your palms about shoulder width apart and at the height of your  head.  Keeping your back as relaxed as possible, slowly straighten your elbows while keeping your hips on the floor. You may adjust the placement of your hands to maximize your comfort. As you gain motion, your hands will come more underneath your shoulders.  Hold this position __________ seconds.  Slowly return to lying flat on the floor. Repeat __________ times. Complete this exercise __________ times per day.  RANGE OF MOTION- Quadruped, Neutral Spine   Assume a hands and knees position on a firm surface. Keep your hands under your shoulders and your knees under your hips. You may place padding under your knees for comfort.  Drop your head and point your tail bone toward the ground below you. This will round out your lower back like an angry cat. Hold this position for __________ seconds.  Slowly lift your head and release your tail bone so that your back sags  into a large arch, like an old horse.  Hold this position for __________ seconds.  Repeat this until you feel limber in your lower back.  Now, find your "sweet spot." This will be the most comfortable position somewhere between the two previous positions. This is your neutral spine. Once you have found this position, tense your stomach muscles to support your lower back.  Hold this position for __________ seconds. Repeat __________ times. Complete this exercise __________ times per day.  STRENGTHENING EXERCISES - Low Back Strain These exercises may help you when beginning to rehabilitate your injury. These exercises should be done near your "sweet spot." This is the neutral, low-back arch, somewhere between fully rounded and fully arched, that is your least painful position. When performed in this safe range of motion, these exercises can be used for people who have either a flexion or extension based injury. These exercises may resolve your symptoms with or without further involvement from your physician, physical therapist or  athletic trainer. While completing these exercises, remember:   Muscles can gain both the endurance and the strength needed for everyday activities through controlled exercises.  Complete these exercises as instructed by your physician, physical therapist or athletic trainer. Increase the resistance and repetitions only as guided.  You may experience muscle soreness or fatigue, but the pain or discomfort you are trying to eliminate should never worsen during these exercises. If this pain does worsen, stop and make certain you are following the directions exactly. If the pain is still present after adjustments, discontinue the exercise until you can discuss the trouble with your caregiver. STRENGTHENING Deep Abdominals, Pelvic Tilt  Lie on a firm bed or floor. Keeping your legs in front of you, bend your knees so they are both pointed toward the ceiling and your feet are flat on the floor.  Tense your lower abdominal muscles to press your lower back into the floor. This motion will rotate your pelvis so that your tail bone is scooping upwards rather than pointing at your feet or into the floor.  With a gentle tension and even breathing, hold this position for __________ seconds. Repeat __________ times. Complete this exercise __________ times per day.  STRENGTHENING  Abdominals, Crunches   Lie on a firm bed or floor. Keeping your legs in front of you, bend your knees so they are both pointed toward the ceiling and your feet are flat on the floor. Cross your arms over your chest.  Slightly tip your chin down without bending your neck.  Tense your abdominals and slowly lift your trunk high enough to just clear your shoulder blades. Lifting higher can put excessive stress on the lower back and does not further strengthen your abdominal muscles.  Control your return to the starting position. Repeat __________ times. Complete this exercise __________ times per day.  STRENGTHENING  Quadruped,  Opposite UE/LE Lift   Assume a hands and knees position on a firm surface. Keep your hands under your shoulders and your knees under your hips. You may place padding under your knees for comfort.  Find your neutral spine and gently tense your abdominal muscles so that you can maintain this position. Your shoulders and hips should form a rectangle that is parallel with the floor and is not twisted.  Keeping your trunk steady, lift your right hand no higher than your shoulder and then your left leg no higher than your hip. Make sure you are not holding your breath. Hold this position __________ seconds.  Continuing to keep your abdominal muscles tense and your back steady, slowly return to your starting position. Repeat with the opposite arm and leg. Repeat __________ times. Complete this exercise __________ times per day.  STRENGTHENING  Lower Abdominals, Double Knee Lift  Lie on a firm bed or floor. Keeping your legs in front of you, bend your knees so they are both pointed toward the ceiling and your feet are flat on the floor.  Tense your abdominal muscles to brace your lower back and slowly lift both of your knees until they come over your hips. Be certain not to hold your breath.  Hold __________ seconds. Using your abdominal muscles, return to the starting position in a slow and controlled manner. Repeat __________ times. Complete this exercise __________ times per day.  POSTURE AND BODY MECHANICS CONSIDERATIONS - Low Back Strain Keeping correct posture when sitting, standing or completing your activities will reduce the stress put on different body tissues, allowing injured tissues a chance to heal and limiting painful experiences. The following are general guidelines for improved posture. Your physician or physical therapist will provide you with any instructions specific to your needs. While reading these guidelines, remember:  The exercises prescribed by your provider will help you have  the flexibility and strength to maintain correct postures.  The correct posture provides the best environment for your joints to work. All of your joints have less wear and tear when properly supported by a spine with good posture. This means you will experience a healthier, less painful body.  Correct posture must be practiced with all of your activities, especially prolonged sitting and standing. Correct posture is as important when doing repetitive low-stress activities (typing) as it is when doing a single heavy-load activity (lifting). RESTING POSITIONS Consider which positions are most painful for you when choosing a resting position. If you have pain with flexion-based activities (sitting, bending, stooping, squatting), choose a position that allows you to rest in a less flexed posture. You would want to avoid curling into a fetal position on your side. If your pain worsens with extension-based activities (prolonged standing, working overhead), avoid resting in an extended position such as sleeping on your stomach. Most people will find more comfort when they rest with their spine in a more neutral position, neither too rounded nor too arched. Lying on a non-sagging bed on your side with a pillow between your knees, or on your back with a pillow under your knees will often provide some relief. Keep in mind, being in any one position for a prolonged period of time, no matter how correct your posture, can still lead to stiffness. PROPER SITTING POSTURE In order to minimize stress and discomfort on your spine, you must sit with correct posture. Sitting with good posture should be effortless for a healthy body. Returning to good posture is a gradual process. Many people can work toward this most comfortably by using various supports until they have the flexibility and strength to maintain this posture on their own. When sitting with proper posture, your ears will fall over your shoulders and your  shoulders will fall over your hips. You should use the back of the chair to support your upper back. Your lower back will be in a neutral position, just slightly arched. You may place a small pillow or folded towel at the base of your lower back for support.  When working at a desk, create an environment that supports good, upright posture. Without extra support, muscles tire,  which leads to excessive strain on joints and other tissues. Keep these recommendations in mind: CHAIR:  A chair should be able to slide under your desk when your back makes contact with the back of the chair. This allows you to work closely.  The chair's height should allow your eyes to be level with the upper part of your monitor and your hands to be slightly lower than your elbows. BODY POSITION  Your feet should make contact with the floor. If this is not possible, use a foot rest.  Keep your ears over your shoulders. This will reduce stress on your neck and lower back. INCORRECT SITTING POSTURES  If you are feeling tired and unable to assume a healthy sitting posture, do not slouch or slump. This puts excessive strain on your back tissues, causing more damage and pain. Healthier options include:  Using more support, like a lumbar pillow.  Switching tasks to something that requires you to be upright or walking.  Talking a brief walk.  Lying down to rest in a neutral-spine position. PROLONGED STANDING WHILE SLIGHTLY LEANING FORWARD  When completing a task that requires you to lean forward while standing in one place for a long time, place either foot up on a stationary 2-4 inch high object to help maintain the best posture. When both feet are on the ground, the lower back tends to lose its slight inward curve. If this curve flattens (or becomes too large), then the back and your other joints will experience too much stress, tire more quickly, and can cause pain. CORRECT STANDING POSTURES Proper standing posture  should be assumed with all daily activities, even if they only take a few moments, like when brushing your teeth. As in sitting, your ears should fall over your shoulders and your shoulders should fall over your hips. You should keep a slight tension in your abdominal muscles to brace your spine. Your tailbone should point down to the ground, not behind your body, resulting in an over-extended swayback posture.  INCORRECT STANDING POSTURES  Common incorrect standing postures include a forward head, locked knees and/or an excessive swayback. WALKING Walk with an upright posture. Your ears, shoulders and hips should all line-up. PROLONGED ACTIVITY IN A FLEXED POSITION When completing a task that requires you to bend forward at your waist or lean over a low surface, try to find a way to stabilize 3 out of 4 of your limbs. You can place a hand or elbow on your thigh or rest a knee on the surface you are reaching across. This will provide you more stability so that your muscles do not fatigue as quickly. By keeping your knees relaxed, or slightly bent, you will also reduce stress across your lower back. CORRECT LIFTING TECHNIQUES DO :   Assume a wide stance. This will provide you more stability and the opportunity to get as close as possible to the object which you are lifting.  Tense your abdominals to brace your spine. Bend at the knees and hips. Keeping your back locked in a neutral-spine position, lift using your leg muscles. Lift with your legs, keeping your back straight.  Test the weight of unknown objects before attempting to lift them.  Try to keep your elbows locked down at your sides in order get the best strength from your shoulders when carrying an object.  Always ask for help when lifting heavy or awkward objects. INCORRECT LIFTING TECHNIQUES DO NOT:   Lock your knees when lifting, even if  it is a small object.  Bend and twist. Pivot at your feet or move your feet when needing to  change directions.  Assume that you can safely pick up even a paper clip without proper posture. Document Released: 07/28/2005 Document Revised: 10/20/2011 Document Reviewed: 11/09/2008 [facility] Patient Information 2014 [place], [place].  BLOOD PRESSURE: Check BP regularly, write down on a list that you bring to your visits.  Follow low sodium diet.  If BP's remain elevated, meds will need to be changed.  Given normal BP here last week, will hold off on changing meds, and work harder on low sodium diet, weight loss and exercise.  Try and increase exercise to at least 30-45 minutes daily.  Sodium-Controlled Diet Sodium is a mineral. It is found in many foods. Sodium may be found naturally or added during the making of a food. The most common form of sodium is salt, which is made up of sodium and chloride. Reducing your sodium intake involves changing your eating habits. The following guidelines will help you reduce the sodium in your diet:  Stop using the salt shaker.  Use salt sparingly in cooking and baking.  Substitute with sodium-free seasonings and spices.  Do not use a salt substitute (potassium chloride) without your caregiver's permission.  Include a variety of fresh, unprocessed foods in your diet.  Limit the use of processed and convenience foods that are high in sodium. USE THE FOLLOWING FOODS SPARINGLY: Breads/Starches  Commercial bread stuffing, commercial pancake or waffle mixes, coating mixes. Waffles. Croutons. Prepared (boxed or frozen) potato, rice, or noodle mixes that contain salt or sodium. Salted [place] fries or hash browns. Salted popcorn, breads, crackers, chips, or snack foods. Vegetables  Vegetables canned with salt or prepared in cream, butter, or cheese sauces. Sauerkraut. Tomato or vegetable juices canned with salt.  Fresh vegetables are allowed if rinsed thoroughly. Fruit  Fruit is okay to eat. Meat and Meat Substitutes  Salted or smoked meats, such  as bacon or Canadian bacon, chipped or corned beef, hot dogs, salt pork, luncheon meats, pastrami, ham, or sausage. Canned or smoked fish, poultry, or meat. Processed cheese or cheese spreads, blue or [place] cheese. Battered or frozen fish products. Prepared spaghetti sauce. Baked beans. Reuben sandwiches. Salted nuts. Caviar. Milk  Limit buttermilk to 1 cup per week. Soups and Combination Foods  Bouillon cubes, canned or dried soups, broth, consomm. Convenience (frozen or packaged) dinners with more than 600 mg sodium. Pot pies, pizza, Asian food, fast food cheeseburgers, and specialty sandwiches. Desserts and Sweets  Regular (salted) desserts, pie, commercial fruit snack pies, commercial snack cakes, canned puddings.  Eat desserts and sweets in moderation. Fats and Oils  Gravy mixes or canned gravy. No more than 1 to 2 tbs of salad dressing. Chip dips.  Eat fats and oils in moderation. Beverages  See those listed under the vegetables and milk groups. Condiments  Ketchup, mustard, meat sauces, salsa, regular (salted) and lite soy sauce or mustard. Dill pickles, olives, meat tenderizer. Prepared horseradish or pickle relish. Dutch-processed cocoa. Baking powder or baking soda used medicinally. Worcestershire sauce. "Light" salt. Salt substitute, unless approved by your caregiver. Document Released: 01/17/2002 Document Revised: 10/20/2011 Document Reviewed: 08/20/2009 [facility] Patient Information 2014 [place], [place].

## 2013-04-21 NOTE — Progress Notes (Signed)
Chief Complaint  Patient presents with  . Hypertension    follow up. Also has form to be filled out for extra bathroom time due to her diuretic.    Hypertension follow-up:  Blood pressures haven't been checked recently, but were running 120's/80's.  Denies dizziness, chest pain, edema.  Denies side effects of medications. Reports being compliant with her meds. She admits to having sausage today, hot dogs yesterday.  Higher sodium in diet recently.  She has been getting 15 minutes of exercise each night (when she gets to the hotel).   She was seen last week with headache--she was concerned that her BP might be high, but it was actually normal.  Headache resolved, none since. She had gotten into an argument with her husband the night before, she hadn't slept well.   Hyperlipidemia follow-up:  Patient is reportedly following a low-fat, low cholesterol diet.  She previously took pravastatin, but stopped when she went over to [place] to be with him.  She has been back since July, and she is waiting to find out when he will be able to come here.  She returns to visit in October.  Not using contraception.  Her right breast had been very painful about 2 weeks ago.  She made appointment, but had to cancel due to change in work schedule. It was sore for about 3 days.  One day when she wore a white bra, she noticed some blood in the bra, and pain resolved after that.  She isn't sure if there could have been an abscess or other lesion.  Pain and bleeding have resolved, no current complaints.  She is complaining of back pain.  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  Hurts with sitting, feels better if she sits straight up, or puts something hard at her back (a pillow, or even her first).   Past Medical History  Diagnosis Date  . Hypertension age 70  . Obesity   . Hyperlipidemia age 89  . Iron deficiency anemia    Past Surgical History  Procedure  Laterality Date  . Orif femur fracture    . Cesarean section  3/96   History   Social History  . Marital Status: Single    Spouse Name: N/A    Number of Children: N/A  . Years of Education: N/A   Occupational History  . Not on file.   Social History Main Topics  . Smoking status: Never Smoker   . Smokeless tobacco: Never Used  . Alcohol Use: No  . Drug Use: No  . Sexual Activity: Not Currently    Partners: Male     Comment: husband currently in [place], returning 02/2013   Other Topics Concern  . 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  Lives with 57 year old daughter, 1 cat Husband is living in [place] (citizen there), trying to come to [place] (has been delayed)    Current outpatient prescriptions:amLODipine (NORVASC) 10 MG tablet, TAKE 1 TABLET (10 MG TOTAL) BY MOUTH DAILY., Disp: 30 tablet, Rfl: 1;  hydrochlorothiazide (HYDRODIURIL) 25 MG tablet, TAKE 1 TABLET (25 MG TOTAL) BY MOUTH DAILY., Disp: 90 tablet, Rfl: 0;  Multiple Vitamins-Minerals (MULTIVITAMIN GUMMIES ADULT PO), Take 1 each by mouth daily., Disp: , Rfl:  albuterol (PROVENTIL HFA) 108 (90 BASE) MCG/ACT inhaler, Inhale 2 puffs into the  lungs every 6 (six) hours as needed for wheezing., Disp: 1 Inhaler, Rfl: 0;  Colesevelam HCl 3.75 G PACK, Take 1 packet by mouth daily., Disp: 30 each, Rfl: 2  Allergies  Allergen Reactions  . Penicillins Other (See Comments)    historical   ROS:  Denies fevers, chills, nausea, vomiting, GI complaints, URI symptoms, cough, shortness of breath. +back pain. Breast pain resolved.  Denies numbness, tingling, weakness, bleeding, bruising, rash, depression or other concerns.  See HPI  PHYSICAL EXAM: BP 160/94  Pulse 80  Ht 5\' 5"  (1.651 m)  Wt 271 lb (122.925 kg)  BMI 45.1 kg/m2  LMP 04/11/2013 160/94 on repeat by MD, RA with large cuff Pleasant, obese female in no distress Neck: no lymphadenopathy, thyromegaly,  carotid bruit or mass Heart: regular rate and rhythm without murmur Lungs: clear bilaterally Back: no spinal or CVA tenderness, no muscle spasm Abdomen: soft, nontender, no mass Extremities: no edema, 2+ pulse Neuro: alert and oriented, DTR's  2+ and symmetric in LE's. Negative SLR.  Normal strength, sensation, gait.  Cranial nerves intact. Psych: normal mood,affect, hygiene and grooming  ASSESSMENT/PLAN:  Essential hypertension, benign - elevated today, normal last week.  reveiwed low sodium diet, need for weight loss, increase exercise.  monitor elsewhere - Plan: hydrochlorothiazide (HYDRODIURIL) 25 MG tablet  Pure hypercholesterolemia - avoid statins due to lack of contraception (and planned visits with husband).  start Welchol. reviewed side effects, how to take.  low cholesterol diet - Plan: Colesevelam HCl 3.75 G PACK  Lumbago - reviewed posture, stretches and strengthening exercises.  return if worsening for re-eval   HTN--BP elevated today, was normal last week (when she had a headache), likely related to high sodium intake.  Check BP regularly, write down.  Follow low sodium diet.  If BP's remain elevated, meds will need to be changed.  Given normal BP here last week, will hold off on changing meds, and work harder on low sodium diet, weight loss and exercise.  Try and increase exercise to at least 30-45 minutes daily.  Low back pain--discussed proper posture, lumbar support.  Encouraged regular stretching, strengthening exercises (consider yoga, pilates)  Start prenatal vitamin. Remain off Pravastatin due to potential risk for pregnancy (visiting spouse in [place]; okay with getting pregnant). Start Welchol   F/u 3 months for fasting med check, sooner prn

## 2013-04-22 ENCOUNTER — Encounter: Payer: Self-pay | Admitting: Family Medicine

## 2013-04-28 ENCOUNTER — Other Ambulatory Visit: Payer: Self-pay | Admitting: *Deleted

## 2013-04-28 MED ORDER — AMLODIPINE BESYLATE 10 MG PO TABS: 10.0000 mg | ORAL_TABLET | Freq: Every day | ORAL | Status: DC

## 2013-07-22 ENCOUNTER — Other Ambulatory Visit: Payer: Self-pay | Admitting: Family Medicine

## 2013-09-11 ENCOUNTER — Other Ambulatory Visit: Payer: Self-pay | Admitting: Family Medicine

## 2013-09-22 ENCOUNTER — Encounter: Payer: Self-pay | Admitting: Family Medicine

## 2013-09-22 ENCOUNTER — Ambulatory Visit (INDEPENDENT_AMBULATORY_CARE_PROVIDER_SITE_OTHER): Payer: BC Managed Care – PPO | Admitting: Family Medicine

## 2013-09-22 VITALS — BP 130/84 | HR 80 | Ht 65.0 in | Wt 276.0 lb

## 2013-09-22 DIAGNOSIS — E7800 Pure hypercholesterolemia, unspecified: Secondary | ICD-10-CM

## 2013-09-22 DIAGNOSIS — I10 Essential (primary) hypertension: Secondary | ICD-10-CM

## 2013-09-22 NOTE — Progress Notes (Signed)
Chief Complaint  Patient presents with  . Hypertension    blood pressure follow up. Patient forgot that Welchol was sent to pharmacy, she did not pick up and is not taking anything for her cholesterol.      BP 130/84  Pulse 80  Ht 5\' 5"  (1.651 m)  Wt 276 lb (125.193 kg)  BMI 45.93 kg/m2  LMP 09/18/2013   Patient was NOT seen by physician today. She was in the bathroom when I went to see her, and after returning from bathroom, her taxi was waiting.  She rescheduled appointment

## 2013-09-28 ENCOUNTER — Encounter: Payer: BC Managed Care – PPO | Admitting: Family Medicine

## 2013-10-05 ENCOUNTER — Encounter: Payer: BC Managed Care – PPO | Admitting: Family Medicine

## 2013-10-24 ENCOUNTER — Other Ambulatory Visit: Payer: Self-pay | Admitting: Family Medicine

## 2013-10-25 ENCOUNTER — Encounter: Payer: Self-pay | Admitting: Family Medicine

## 2013-10-28 ENCOUNTER — Other Ambulatory Visit: Payer: Self-pay | Admitting: Family Medicine

## 2013-11-03 ENCOUNTER — Ambulatory Visit (INDEPENDENT_AMBULATORY_CARE_PROVIDER_SITE_OTHER): Payer: BC Managed Care – PPO | Admitting: Family Medicine

## 2013-11-03 ENCOUNTER — Encounter: Payer: Self-pay | Admitting: Family Medicine

## 2013-11-03 ENCOUNTER — Encounter: Payer: BC Managed Care – PPO | Admitting: Family Medicine

## 2013-11-03 VITALS — BP 142/80 | HR 88 | Ht 65.0 in | Wt 278.0 lb

## 2013-11-03 DIAGNOSIS — E1165 Type 2 diabetes mellitus with hyperglycemia: Principal | ICD-10-CM

## 2013-11-03 DIAGNOSIS — R5381 Other malaise: Secondary | ICD-10-CM

## 2013-11-03 DIAGNOSIS — IMO0001 Reserved for inherently not codable concepts without codable children: Secondary | ICD-10-CM

## 2013-11-03 DIAGNOSIS — R635 Abnormal weight gain: Secondary | ICD-10-CM

## 2013-11-03 DIAGNOSIS — R5383 Other fatigue: Secondary | ICD-10-CM

## 2013-11-03 DIAGNOSIS — R7301 Impaired fasting glucose: Secondary | ICD-10-CM

## 2013-11-03 DIAGNOSIS — E7800 Pure hypercholesterolemia, unspecified: Secondary | ICD-10-CM

## 2013-11-03 DIAGNOSIS — I10 Essential (primary) hypertension: Secondary | ICD-10-CM

## 2013-11-03 DIAGNOSIS — D509 Iron deficiency anemia, unspecified: Secondary | ICD-10-CM

## 2013-11-03 LAB — GLUCOSE, POCT (MANUAL RESULT ENTRY): POC Glucose: 382 mg/dl — AB (ref 70–99)

## 2013-11-03 LAB — POCT GLYCOSYLATED HEMOGLOBIN (HGB A1C): HEMOGLOBIN A1C: 8.6

## 2013-11-03 MED ORDER — METFORMIN HCL 500 MG PO TABS: 500.0000 mg | ORAL_TABLET | Freq: Two times a day (BID) | ORAL | Status: DC

## 2013-11-03 MED ORDER — AMLODIPINE BESYLATE 10 MG PO TABS: ORAL_TABLET | ORAL | Status: DC

## 2013-11-03 NOTE — Patient Instructions (Addendum)
Take Metformin 1 daily for 3 days, and if tolerating it, increase to taking it twice daily, before breakfast and dinner.  If you develop loose stools, try taking it with fiber.  Stop drinking lemonade entirely.  Look at sugars in your foods/drinks.  Use artificial sweeteners rather than sugar (ie splenda; drink diet sodas, not regular).  Exercise at least 30 minutes every day.  Schedule a diabetic eye exam.  Do not get new glasses made until your sugars are lower.   Check your blood pressure elsewhere.  Diabetes and Standards of Medical Care  Diabetes is complicated. You may find that your diabetes team includes a dietitian, nurse, diabetes educator, eye doctor, and more. To help everyone know what is going on and to help you get the care you deserve, the following schedule of care was developed to help keep you on track. Below are the tests, exams, vaccines, medicines, education, and plans you will need. HbA1c test This test shows how well you have controlled your glucose over the past 2 3 months. It is used to see if your diabetes management plan needs to be adjusted.   It is performed at least 2 times a year if you are meeting treatment goals.  It is performed 4 times a year if therapy has changed or if you are not meeting treatment goals. Blood pressure test  This test is performed at every routine medical visit. The goal is less than 140/90 mmHg for most people, but 130/80 mmHg in some cases. Ask your health care provider about your goal. Dental exam  Follow up with the dentist regularly. Eye exam  If you are diagnosed with type 1 diabetes as a child, get an exam upon reaching the age of 47 years or older and have had diabetes for 3 5 years. Yearly eye exams are recommended after that initial eye exam.  If you are diagnosed with type 1 diabetes as an adult, get an exam within 5 years of diagnosis and then yearly.  If you are diagnosed with type 2 diabetes, get an exam as soon as  possible after the diagnosis and then yearly. Foot care exam  Visual foot exams are performed at every routine medical visit. The exams check for cuts, injuries, or other problems with the feet.  A comprehensive foot exam should be done yearly. This includes visual inspection as well as assessing foot pulses and testing for loss of sensation.  Check your feet nightly for cuts, injuries, or other problems with your feet. Tell your health care provider if anything is not healing. Kidney function test (urine microalbumin)  This test is performed once a year.  Type 1 diabetes: The first test is performed 5 years after diagnosis.  Type 2 diabetes: The first test is performed at the time of diagnosis.  A serum creatinine and estimated glomerular filtration rate (eGFR) test is done once a year to assess the level of chronic kidney disease (CKD), if present. Lipid profile (cholesterol, HDL, LDL, triglycerides)  Performed every 5 years for most people.  The goal for LDL is less than 100 mg/dL. If you are at high risk, the goal is less than 70 mg/dL.  The goal for HDL is 40 mg/dL 50 mg/dL for men and 50 mg/dL 60 mg/dL for women. An HDL cholesterol of 60 mg/dL or higher gives some protection against heart disease.  The goal for triglycerides is less than 150 mg/dL. Influenza vaccine, pneumococcal vaccine, and hepatitis B vaccine  The influenza  vaccine is recommended yearly.  The pneumococcal vaccine is generally given once in a lifetime. However, there are some instances when another vaccination is recommended. Check with your health care provider.  The hepatitis B vaccine is also recommended for adults with diabetes. Diabetes self-management education  Education is recommended at diagnosis and ongoing as needed. Treatment plan  Your treatment plan is reviewed at every medical visit. Document Released: 05/25/2009 Document Revised: 03/30/2013 Document Reviewed: 12/28/2012 [facility]  Patient Information 2014 [place].  Blood Glucose Monitoring, Adult Monitoring your blood glucose (also know as blood sugar) helps you to manage your diabetes. It also helps you and your health care provider monitor your diabetes and determine how well your treatment plan is working. WHY SHOULD YOU MONITOR YOUR BLOOD GLUCOSE?  It can help you understand how food, exercise, and medicine affect your blood glucose.  It allows you to know what your blood glucose is at any given moment. You can quickly tell if you are having low blood glucose (hypoglycemia) or high blood glucose (hyperglycemia).  It can help you and your health care provider know how to adjust your medicines.  It can help you understand how to manage an illness or adjust medicine for exercise. WHEN SHOULD YOU TEST? Your health care provider will help you decide how often you should check your blood glucose. This may depend on the type of diabetes you have, your diabetes control, or the types of medicines you are taking. Be sure to write down all of your blood glucose readings so that this information can be reviewed with your health care provider. See below for examples of testing times that your health care provider may suggest. Type 1 Diabetes  Test 4 times a day if you are in good control, using an insulin pump, or perform multiple daily injections.  If your diabetes is not well-controlled or if you are sick, you may need to monitor more often.  It is a good idea to also monitor:  Before and after exercise.  Between meals and 2 hours after a meal.  Occasionally between 2:00 to 3:00 am. Type 2 Diabetes  It can vary with each person, but generally, if you are on insulin, test 4 times a day.  If you take medicines by mouth (orally), test 2 times a day.  If you are on a controlled diet, test once a day.  If your diabetes is not well controlled or if you are sick, you may need to monitor more often. HOW TO MONITOR  YOUR BLOOD GLUCOSE Supplies Needed  Blood glucose meter.  Test strips for your meter. Each meter has its own strips. You must use the strips that go with your own meter.  A pricking needle (lancet).  A device that holds the lancet (lancing device).  A journal or log book to write down your results. Procedure  Wash your hands with soap and water. Alcohol is not preferred.  Prick the side of your finger (not the tip) with the lancet.  Gently milk the finger until a small drop of blood appears.  Follow the instructions that come with your meter for inserting the test strip, applying blood to the strip, and using your blood glucose meter. Other Areas to Get Blood for Testing Some meters allow you to use other areas of your body (other than your finger) to test your blood. These areas are called alternative sites. The most common alternative sites are:  The forearm.  The thigh.  The back  area of the lower leg.  The palm of the hand. The blood flow in these areas is slower. Therefore, the blood glucose values you get may be delayed, and the numbers are different from what you would get from your fingers. Do not use alternative sites if you think you are having hypoglycemia. Your reading will not be accurate. Always use a finger if you are having hypoglycemia. Also, if you cannot feel your lows (hypoglycemia unawareness), always use your fingers for your blood glucose checks. ADDITIONAL TIPS FOR GLUCOSE MONITORING  Do not reuse lancets.  Always carry your supplies with you.  All blood glucose meters have a 24-hour "hotline" number to call if you have questions or need help.  Adjust (calibrate) your blood glucose meter with a control solution after finishing a few boxes of strips. BLOOD GLUCOSE RECORD KEEPING It is a good idea to keep a daily record or log of your blood glucose readings. Most glucose meters, if not all, keep your glucose records stored in the meter. Some meters come  with the ability to download your records to your home computer. Keeping a record of your blood glucose readings is especially helpful if you are wanting to look for patterns. Make notes to go along with the blood glucose readings because you might forget what happened at that exact time. Keeping good records helps you and your health care provider to work together to achieve good diabetes management.  Document Released: 07/31/2003 Document Revised: 03/30/2013 Document Reviewed: 12/20/2012 [facility] Patient Information 2014 [place].

## 2013-11-03 NOTE — Progress Notes (Signed)
Chief Complaint  Patient presents with  . Hypertension    nonfasting med check. Has FMLA form to be filled out-slightly different than last form that was filled out.    Hypertension follow-up:  Blood pressures are not checked elsewhere. She has cut back on the salt in her diet.  She is eating more salad instead of fast food for lunch. She is eating a boiled egg (1/day) during the week (5 eggs/week).  She snacks on a honey/nut/almond cereal as snacks at her desk instead of chips. Snacking on unsalted almonds (but large portions).  She is eating more fruits and vegetables. She has sedentary jobs, and exercise is limited.  Denies dizziness, headaches, chest pain.  Denies side effects of medications.  11am-4pm first job, and then 5-11 pm on her 2nd job, both 5 days/week.  Sometimes has to work extra hours on her days off.  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  She brings in FMLA forms (previously needed note re: extra time for frequent bathroom breaks, but HR now states she needs FMLA).  Review of chart shows impaired fasting glucose--sugar was 105 when fasting for labs in 11/2012. nonfasting glucose was 123.  Hasn't had labs since 12/2012.  She was supposed to return in 3 months after starting Welchol in September.  Never started the [facility]. Last lipid panel was 11/2012--she had missed some of her pravastatin doses.  LDL was 140.  She is trying for pregnancy.  Previously took pravastatin, but that was stopped when contraception stopped.    H/o iron deficiency anemia.  Periods are regular, not as heavy as in the past.  Having some more cramping, but not heavy.  Some cramping in feet, and in stomach if she leans forwrard  Some tingling in hands and feet. +excessive thirst, frequent urination (no significant increase in frequency since originally started on diuretic last year).  She drinks lemonade 40 oz every day  Past Medical History  Diagnosis  Date  . Hypertension age 68  . Obesity   . Hyperlipidemia age 2  . Iron deficiency anemia    Past Surgical History  Procedure Laterality Date  . Orif femur fracture    . Cesarean section  3/96   History   Social History  . Marital Status: Single    Spouse Name: N/A    Number of Children: N/A  . Years of Education: N/A   Occupational History  . Not on file.   Social History Main Topics  . Smoking status: Never Smoker   . Smokeless tobacco: Never Used  . Alcohol Use: No  . Drug Use: No  . Sexual Activity: Not Currently    Partners: Male     Comment: husband currently in [place], returning 02/2013   Other Topics Concern  . 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  Lives with 1 year old daughter, 1 cat Husband is living in [place] (citizen there), trying to come to [place] (has been delayed)    Outpatient Encounter Prescriptions as of 11/03/2013  Medication Sig Note  . amLODipine (NORVASC) 10 MG tablet TAKE 1 TABLET (10 MG TOTAL) BY MOUTH DAILY.   . hydrochlorothiazide (HYDRODIURIL) 25 MG tablet TAKE 1 TABLET BY MOUTH EVERY DAY   . Multiple Vitamins-Minerals (MULTIVITAMIN GUMMIES ADULT PO) Take 1 each by mouth daily.   Marland Kitchen albuterol (  PROVENTIL HFA) 108 (90 BASE) MCG/ACT inhaler Inhale 2 puffs into the lungs every 6 (six) hours as needed for wheezing. 11/03/2013: Used it last week with weather change; only uses it very rarely, once every 3 weeks  . Colesevelam HCl 3.75 G PACK Take 1 packet by mouth daily. 11/03/2013: She never picked up the prescription for this in September; never started taking this medication   Allergies  Allergen Reactions  . Penicillins Other (See Comments)    historical   ROS:  Denies fevers ,chills, nausea, vomiting, diarrhea, cough, shortness of breath, chest pain, palpitations, headaches, dizziness, URI symptoms, edema, bleeding, bruising, rashes.  +vision changes--recently went to eye  doctor and is about to order glasses. Low back pain resolved when drinking more water. No other joint pains.  See HPI  PHYSICAL EXAM: BP 142/80  Pulse 88  Ht 5\' 5"  (1.651 m)  Wt 278 lb (126.1 kg)  BMI 46.26 kg/m2  LMP 10/19/2013 Pleasant, morbidly obese female, in good spirits, no distress HEENT:  PERRL, EOMI, conjunctiva clear Neck: no lymphadenopathy, thyromegaly or carotid bruit Heart: regular rate and rhythm without murmur Lungs: clear bilaterally Back: no spine or CVA tenderness Abdomen: obese, nontender, no organomegaly or mass Extremities: no edema, 2+ pulse, normal sensation Skin: no lesions Psych: normal mood, affect, hygiene and grooming  Glucose 382-- 2 hours after eating Lab Results  Component Value Date   HGBA1C 8.6 11/03/2013     ASSESSMENT/PLAN: Type II or unspecified type diabetes mellitus without mention of complication, uncontrolled - new diagnosis; counseled extensively re: risks of DM, need for diet, exercise, weight loss, monitoring - Plan: Amb ref to Medical Nutrition Therapy-MNT, metFORMIN (GLUCOPHAGE) 500 MG tablet, Microalbumin / creatinine urine ratio  Essential hypertension, benign - borderline control.  trying for pregnancy so no ACEI.  low sodium diet, weight loss and exercise reviewed.  cont current meds, check BP elsewhere - Plan: Comprehensive metabolic panel, Amb ref to Medical Nutrition Therapy-MNT, amLODipine (NORVASC) 10 MG tablet  Pure hypercholesterolemia - pt trying for pregnancy, so no statin.  Not sure that Welchol will get her to goal, but will help with chol and sugar.  check baseline labs, then re-rx  - Plan: Lipid panel, Amb ref to Medical Nutrition Therapy-MNT  Anemia, iron deficiency - Plan: CBC with Differential, Ferritin  Weight gain - Plan: TSH  Other malaise and fatigue - Plan: CBC with Differential, Vit D  25 hydroxy (rtn osteoporosis monitoring), TSH, Comprehensive metabolic panel  Impaired fasting glucose - Plan: Glucose  (CBG), HgB A1c  Refer to dietician (send labs when available)  Counseled re: diet (for HTN, hypercholesterolemia, and then for diabetes, once A1c results were seen), exercise (how to incorporate into her busy schedule, need for at least 30 mins daily), weight loss programs (weight watchers, etc) medications for weight loss (she is not interested in medications), and briefly mentioned surgical options.  Return fasting for labs within the week.  Expect LDL to be very high, and to start Welchol (due to trying for pregnancy; will help lower A1c in addition to LDL, but likely won't get her to goal).  DM--Unable to swallow large pills, so giving regular metformin rather than ER.  Counseled re: risks and side effects of meds.  Start at qd x 3 days, then increase to BID if tolerating.  Pt was advised to schedule a diabetic eye exam, and to not get new glasses made until sugars are lower.  Do diabetic foot exam at next visit  62  minute visit, more than 1/2 spent counseling  F/u 6 weeks on diabetes; see nutritionist in interim  Given glucometer and shown how to use meter.  FMLA form filled out

## 2013-11-04 LAB — MICROALBUMIN / CREATININE URINE RATIO
Creatinine, Urine: 58.9 mg/dL
MICROALB UR: 1.71 mg/dL (ref 0.00–1.89)
Microalb Creat Ratio: 29 mg/g (ref 0.0–30.0)

## 2013-11-08 ENCOUNTER — Telehealth: Payer: Self-pay | Admitting: Family Medicine

## 2013-11-09 NOTE — Telephone Encounter (Signed)
Already faxed to this number on 11/07/13.

## 2013-11-24 ENCOUNTER — Other Ambulatory Visit: Payer: Self-pay | Admitting: Family Medicine

## 2013-11-28 ENCOUNTER — Ambulatory Visit: Payer: BC Managed Care – PPO | Admitting: Family Medicine

## 2013-11-28 ENCOUNTER — Telehealth: Payer: Self-pay | Admitting: Family Medicine

## 2013-11-28 NOTE — Telephone Encounter (Signed)
lm

## 2013-12-14 ENCOUNTER — Ambulatory Visit: Payer: BC Managed Care – PPO | Admitting: *Deleted

## 2013-12-15 ENCOUNTER — Ambulatory Visit: Payer: BC Managed Care – PPO | Admitting: Family Medicine

## 2014-01-04 ENCOUNTER — Other Ambulatory Visit: Payer: Self-pay | Admitting: Family Medicine

## 2014-01-12 ENCOUNTER — Ambulatory Visit (INDEPENDENT_AMBULATORY_CARE_PROVIDER_SITE_OTHER): Payer: BC Managed Care – PPO | Admitting: Family Medicine

## 2014-01-12 ENCOUNTER — Encounter: Payer: Self-pay | Admitting: Family Medicine

## 2014-01-12 VITALS — BP 142/86 | HR 72 | Ht 65.0 in | Wt 274.0 lb

## 2014-01-12 DIAGNOSIS — IMO0001 Reserved for inherently not codable concepts without codable children: Secondary | ICD-10-CM

## 2014-01-12 DIAGNOSIS — R5381 Other malaise: Secondary | ICD-10-CM

## 2014-01-12 DIAGNOSIS — Z6841 Body Mass Index (BMI) 40.0 and over, adult: Secondary | ICD-10-CM

## 2014-01-12 DIAGNOSIS — E1165 Type 2 diabetes mellitus with hyperglycemia: Principal | ICD-10-CM

## 2014-01-12 DIAGNOSIS — I10 Essential (primary) hypertension: Secondary | ICD-10-CM

## 2014-01-12 DIAGNOSIS — R635 Abnormal weight gain: Secondary | ICD-10-CM

## 2014-01-12 DIAGNOSIS — R5383 Other fatigue: Secondary | ICD-10-CM

## 2014-01-12 DIAGNOSIS — E7800 Pure hypercholesterolemia, unspecified: Secondary | ICD-10-CM

## 2014-01-12 DIAGNOSIS — D509 Iron deficiency anemia, unspecified: Secondary | ICD-10-CM

## 2014-01-12 LAB — CBC WITH DIFFERENTIAL/PLATELET
Basophils Absolute: 0.1 K/uL (ref 0.0–0.1)
Basophils Relative: 1 % (ref 0–1)
Eosinophils Absolute: 0.1 K/uL (ref 0.0–0.7)
Eosinophils Relative: 2 % (ref 0–5)
HCT: 34.4 % — ABNORMAL LOW (ref 36.0–46.0)
Hemoglobin: 11 g/dL — ABNORMAL LOW (ref 12.0–15.0)
Lymphocytes Relative: 44 % (ref 12–46)
Lymphs Abs: 2.5 K/uL (ref 0.7–4.0)
MCH: 24.7 pg — ABNORMAL LOW (ref 26.0–34.0)
MCHC: 32 g/dL (ref 30.0–36.0)
MCV: 77.1 fL — ABNORMAL LOW (ref 78.0–100.0)
Monocytes Absolute: 0.6 K/uL (ref 0.1–1.0)
Monocytes Relative: 11 % (ref 3–12)
Neutro Abs: 2.4 K/uL (ref 1.7–7.7)
Neutrophils Relative %: 42 % — ABNORMAL LOW (ref 43–77)
Platelets: 327 K/uL (ref 150–400)
RBC: 4.46 MIL/uL (ref 3.87–5.11)
RDW: 17.3 % — ABNORMAL HIGH (ref 11.5–15.5)
WBC: 5.6 K/uL (ref 4.0–10.5)

## 2014-01-12 LAB — LIPID PANEL
CHOL/HDL RATIO: 6.2 ratio
Cholesterol: 199 mg/dL (ref 0–200)
HDL: 32 mg/dL — AB (ref >39)
LDL Cholesterol: 157 mg/dL — ABNORMAL HIGH (ref 0–99)
TRIGLYCERIDES: 52 mg/dL (ref <150)
VLDL: 10 mg/dL (ref 0–40)

## 2014-01-12 LAB — COMPREHENSIVE METABOLIC PANEL
ALK PHOS: 72 U/L (ref 39–117)
ALT: 103 U/L — AB (ref 0–35)
AST: 65 U/L — AB (ref 0–37)
Albumin: 3.8 g/dL (ref 3.5–5.2)
BILIRUBIN TOTAL: 0.3 mg/dL (ref 0.2–1.2)
BUN: 10 mg/dL (ref 6–23)
CO2: 25 meq/L (ref 19–32)
Calcium: 8.9 mg/dL (ref 8.4–10.5)
Chloride: 106 mEq/L (ref 96–112)
Creat: 0.57 mg/dL (ref 0.50–1.10)
Glucose, Bld: 106 mg/dL — ABNORMAL HIGH (ref 70–99)
Potassium: 4 mEq/L (ref 3.5–5.3)
SODIUM: 141 meq/L (ref 135–145)
Total Protein: 7.2 g/dL (ref 6.0–8.3)

## 2014-01-12 MED ORDER — COLESEVELAM HCL 625 MG PO TABS: ORAL_TABLET | ORAL | Status: DC

## 2014-01-12 MED ORDER — METFORMIN HCL 500 MG PO TABS
ORAL_TABLET | ORAL | Status: DC
Start: 2014-01-12 — End: 2018-10-19

## 2014-01-12 NOTE — Patient Instructions (Addendum)
Increase the metformin to 3 pills/day (2 prior to morning meal, and 1 prior to dinner, or vice versa)  Start the welchol tablets, 3 tablets twice daily (vs 6 all at once)  Continue other medications. Try and check BP elsewhere.  Hopefully, with weight loss, low sodium diet, your blood pressure will improve without having to add medications.  Ask your husband about snoring and apnea (stopping breathing while sleeping).

## 2014-01-12 NOTE — Progress Notes (Signed)
Chief Complaint  Patient presents with  . Diabetes    6 week follow up, did not come in for labs that were entered as future orders-would like to do today if possible.    Patient presents for follow-up on diabetes.  Sugars in the mornings have been running 120-146.  1 hour after eating she is seeing 200-230 She continues to have some tingling in hands/feet. Excessive thirst and urination has improved. She cut out sodas, is only having 1 lemonade/week.  She cut out burgers and fries, eating less fast food.  Eating more vegetables, cooking (jamaican food--chicken), trying to eat less bread.  She hasn't seen the dietician yet, but has appt for 7/1.  She had a lot of diarrhea initially after starting the metformin, but that has improved.   Eye appt is scheduled for next week.  HTN--still hasn't been able to check her blood pressure elsewhere.  Denies headaches, dizziness, edema  Hyperlipidemia:  She could not tolerate taking the Welchol powder--she tried it in green tea, OJ--wouldn't dissolve and she couldn't tolerate it.  Willing to try pills.  Today's labs are going to be the baseline lipids (hasn't been taking anything). She is not on contraception (trying for pregnancy), so cannot take statins.  Complaining of fatigue, daytime somnolence.  Gets adequate sleep at night.  Previously was recommended to have sleep study (but nobody opened door when she got there for the study).  She hasn't asked her husband about snoring, apnea.  Hasn't been able to get much exercise, but is trying to walk around more on her lunch break.  Past Medical History  Diagnosis Date  . Hypertension age 71  . Obesity   . Hyperlipidemia age 9  . Iron deficiency anemia   . Diabetes mellitus without complication    Past Surgical History  Procedure Laterality Date  . Orif femur fracture    . Cesarean section  3/96   History   Social History  . Marital Status: Single    Spouse Name: Tiffany Le    Number of Children: Tiffany Le   . Years of Education: Tiffany Le   Occupational History  . Not on file.   Social History Main Topics  . Smoking status: Never Smoker   . Smokeless tobacco: Never Used  . Alcohol Use: No  . Drug Use: No  . Sexual Activity: Not Currently    Partners: Male     Comment: husband currently in [place], returning 02/2013   Other Topics Concern  . 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  Lives with 63 year old daughter, 1 cat Husband is living in [place] (citizen there), trying to come to [place] (has been delayed)    Outpatient Encounter Prescriptions as of 01/12/2014  Medication Sig Note  . amLODipine (NORVASC) 10 MG tablet TAKE 1 TABLET (10 MG TOTAL) BY MOUTH DAILY.   . hydrochlorothiazide (HYDRODIURIL) 25 MG tablet TAKE 1 TABLET BY MOUTH EVERY DAY   . metFORMIN (GLUCOPHAGE) 500 MG tablet Take 1 tablet (500 mg total) by mouth 2 (two) times daily with a meal.   . Multiple Vitamins-Minerals (MULTIVITAMIN GUMMIES ADULT PO) Take 1 each by mouth daily.   Marland Kitchen albuterol (PROVENTIL HFA) 108 (90 BASE) MCG/ACT inhaler Inhale 2 puffs into the lungs every 6 (six) hours as needed for wheezing. 01/12/2014: Not needing now  . Colesevelam HCl 3.75 G PACK Take 1 packet by mouth daily.  01/12/2014: Couldn't tolerate the powder, didn't dissolve   Allergies  Allergen Reactions  . Penicillins Other (See Comments)    historical    ROS:  Denies fevers, chills, URI symptoms, headaches, dizziness, chest pain, shortness of breath. Diarrhea after starting metformin has resolved. +tingling in hands/feet.  Denies urinary complaints. +some intentional weight loss. No depression, anxiety  PHYSICAL EXAM: BP 142/86  Pulse 72  Ht 5\' 5"  (1.651 m)  Wt 274 lb (124.286 kg)  BMI 45.60 kg/m2  LMP 01/07/2014 Well developed, pleasant, obese female in no distress Neck: no lymphadenopathy or thyromegaly Heart: regular rate and rhythm, no murmur Lungs: clear  bilaterally Abdomen: soft, nontender, no mass Extremities: no edema, normal pulses Normal diabetic foot exam Psych: normal mood, affect, hygiene and grooming Neuro: cranial nerves intact. Normal strength, gait, sensation  ASSESSMENT/PLAN:  Type II or unspecified type diabetes mellitus without mention of complication, uncontrolled - Sugars improving some.  increase metformin to 3/day.  f/u with nutritionist as scheduled.  continue healthier diet, low carb/sugar, exercise, weight loss - Plan: metFORMIN (GLUCOPHAGE) 500 MG tablet, HM Diabetes Foot Exam  Other malaise and fatigue - if labs normal (no thyroid dz, anemia, Vit D deficiency, or other cause for fatigue found) then refer for sleep study. She will ask husband re: snoring/apnea - Plan: CBC with Differential, Vit D  25 hydroxy (rtn osteoporosis monitoring), TSH, Comprehensive metabolic panel  Pure hypercholesterolemia - pt trying for pregnancy, so no statin. didn't tolerate welchol powder, change to pills. today's lab is baseline - Plan: Lipid panel, colesevelam (WELCHOL) 625 MG tablet  Anemia, iron deficiency - Plan: CBC with Differential, Ferritin  Weight gain - Plan: TSH  Essential hypertension, benign - borderline/elevated; trying for pregnancy so no ACEI.  Cont low sodium diet, weight loss and exercise. cont current meds, check BP elsewhere - Plan: Comprehensive metabolic panel  Morbid obesity with BMI of 45.0-49.9, adult   Increase metformin to 3 pills/day  No ACEI due to trying for pregnancy.  Hope that BP's will continue to improve if she loses weight--can discuss low sodium diet and weight loss with the dietician  Needs sleep study if no other cause for fatigue found.  Ask husband about apnea   F/u 3 mos, fasting

## 2014-01-13 ENCOUNTER — Encounter: Payer: Self-pay | Admitting: Family Medicine

## 2014-01-13 DIAGNOSIS — E559 Vitamin D deficiency, unspecified: Secondary | ICD-10-CM | POA: Insufficient documentation

## 2014-01-13 DIAGNOSIS — R945 Abnormal results of liver function studies: Secondary | ICD-10-CM

## 2014-01-13 DIAGNOSIS — R7989 Other specified abnormal findings of blood chemistry: Secondary | ICD-10-CM | POA: Insufficient documentation

## 2014-01-13 LAB — TSH: TSH: 3.466 u[IU]/mL (ref 0.350–4.500)

## 2014-01-13 LAB — VITAMIN D 25 HYDROXY (VIT D DEFICIENCY, FRACTURES): VIT D 25 HYDROXY: 25 ng/mL — AB (ref 30–89)

## 2014-01-13 LAB — FERRITIN: FERRITIN: 13 ng/mL (ref 10–291)

## 2014-01-16 ENCOUNTER — Other Ambulatory Visit: Payer: Self-pay | Admitting: *Deleted

## 2014-01-16 MED ORDER — ERGOCALCIFEROL 1.25 MG (50000 UT) PO CAPS: 50000.0000 [IU] | ORAL_CAPSULE | ORAL | Status: DC

## 2014-02-09 ENCOUNTER — Ambulatory Visit: Payer: BC Managed Care – PPO | Admitting: *Deleted

## 2014-05-04 ENCOUNTER — Encounter: Payer: BC Managed Care – PPO | Admitting: Family Medicine

## 2014-05-10 ENCOUNTER — Encounter: Payer: BC Managed Care – PPO | Admitting: Family Medicine

## 2014-05-22 ENCOUNTER — Encounter: Payer: Self-pay | Admitting: Family Medicine

## 2014-05-31 ENCOUNTER — Other Ambulatory Visit: Payer: Self-pay | Admitting: Family Medicine

## 2014-06-12 ENCOUNTER — Encounter: Payer: Self-pay | Admitting: Family Medicine

## 2014-11-14 ENCOUNTER — Telehealth: Payer: Self-pay | Admitting: Internal Medicine

## 2014-11-14 NOTE — Telephone Encounter (Signed)
Faxed over medical records to [facility] @ [phone] on March 4,2016

## 2016-01-13 ENCOUNTER — Emergency Department (HOSPITAL_COMMUNITY)
Admission: EM | Admit: 2016-01-13 | Discharge: 2016-01-13 | Disposition: A | Discharge status: Home / Self Care | Payer: Self-pay | Attending: Emergency Medicine | Admitting: Emergency Medicine

## 2016-01-13 ENCOUNTER — Encounter (HOSPITAL_COMMUNITY): Payer: Self-pay | Admitting: Nurse Practitioner

## 2016-01-13 DIAGNOSIS — I10 Essential (primary) hypertension: Secondary | ICD-10-CM

## 2016-01-13 DIAGNOSIS — Z7984 Long term (current) use of oral hypoglycemic drugs: Secondary | ICD-10-CM | POA: Insufficient documentation

## 2016-01-13 DIAGNOSIS — E119 Type 2 diabetes mellitus without complications: Secondary | ICD-10-CM | POA: Insufficient documentation

## 2016-01-13 DIAGNOSIS — M546 Pain in thoracic spine: Secondary | ICD-10-CM

## 2016-01-13 DIAGNOSIS — A599 Trichomoniasis, unspecified: Secondary | ICD-10-CM

## 2016-01-13 LAB — URINALYSIS, ROUTINE W REFLEX MICROSCOPIC
BILIRUBIN URINE: NEGATIVE
Glucose, UA: >1000 mg/dL — AB
HGB URINE DIPSTICK: NEGATIVE
KETONES UR: NEGATIVE mg/dL
Leukocytes, UA: NEGATIVE
Nitrite: NEGATIVE
PH: 7.5 (ref 5.0–8.0)
Protein, ur: NEGATIVE mg/dL
SPECIFIC GRAVITY, URINE: 1.031 — AB (ref 1.005–1.030)

## 2016-01-13 LAB — URINE MICROSCOPIC-ADD ON

## 2016-01-13 MED ORDER — AMLODIPINE BESYLATE 10 MG PO TABS: ORAL_TABLET | ORAL | Status: DC

## 2016-01-13 MED ORDER — NAPROXEN 250 MG PO TABS: 500.0000 mg | ORAL_TABLET | Freq: Once | ORAL | Status: DC

## 2016-01-13 MED ORDER — NAPROXEN 500 MG PO TABS: 500.0000 mg | ORAL_TABLET | Freq: Two times a day (BID) | ORAL | Status: DC

## 2016-01-13 MED ORDER — KETOROLAC TROMETHAMINE 60 MG/2ML IM SOLN
60.0000 mg | Freq: Once | INTRAMUSCULAR | Status: DC
  Filled 2016-01-13: qty 2

## 2016-01-13 MED ORDER — HYDROCHLOROTHIAZIDE 25 MG PO TABS: 25.0000 mg | ORAL_TABLET | Freq: Every day | ORAL | Status: DC

## 2016-01-13 MED ORDER — DIAZEPAM 5 MG PO TABS: 5.0000 mg | ORAL_TABLET | Freq: Once | ORAL | Status: DC

## 2016-01-13 MED ORDER — TRAMADOL HCL 50 MG PO TABS
100.0000 mg | ORAL_TABLET | Freq: Once | ORAL | Status: AC
  Administered 2016-01-13: 100 mg via ORAL
  Filled 2016-01-13: qty 2

## 2016-01-13 MED ORDER — METRONIDAZOLE 500 MG PO TABS
2000.0000 mg | ORAL_TABLET | Freq: Once | ORAL | Status: AC
  Administered 2016-01-13: 2000 mg via ORAL
  Filled 2016-01-13: qty 4

## 2016-01-13 MED ORDER — CYCLOBENZAPRINE HCL 10 MG PO TABS: 10.0000 mg | ORAL_TABLET | Freq: Two times a day (BID) | ORAL | Status: DC | PRN

## 2016-01-13 NOTE — ED Provider Notes (Signed)
CSN: [id number]     Arrival date & time 01/13/16  1159 History   First MD Initiated Contact with Patient 01/13/16 1340     Chief Complaint  Patient presents with  . Back Pain   HPI   Tiffany Le is a 40 y.o. female PMH significant for HTN, HLD, DM presenting with a 1 week history of back pain. She describes the pain as intermittent, contraction-like, 10/10 pain scale, left-sided middle back in location, nonradiating. She denies fevers, chills, nausea/vomiting, abdominal pain, changes in bowel/bladder habits, vaginal complaints.   Past Medical History  Diagnosis Date  . Hypertension age 54  . Obesity   . Hyperlipidemia age 26  . Iron deficiency anemia   . Diabetes mellitus without complication [facility])    Past Surgical History  Procedure Laterality Date  . Orif femur fracture    . Cesarean section  3/96   Family History  Problem Relation Age of Onset  . Heart disease Mother     CABG <50  . Hypertension Mother   . Diabetes Mother   . Hyperlipidemia Mother   . Heart disease Father   . Hyperlipidemia Father   . Hypertension Father   . Early death Father   . Healthy Daughter   . Diabetes Maternal Aunt   . Cancer Maternal Grandmother     stomach cancer  . Diabetes Maternal Aunt   . Cancer Cousin     colon cancer (dx'd 13)   Social History  Substance Use Topics  . Smoking status: Never Smoker   . Smokeless tobacco: Never Used  . Alcohol Use: No   OB History    Gravida Para Term Preterm AB TAB SAB Ectopic Multiple Living   1 1        1      Review of Systems  Ten systems are reviewed and are negative for acute change except as noted in the HPI  Allergies  Penicillins  Home Medications   Prior to Admission medications   Medication Sig Start Date End Date Taking? Authorizing Provider  albuterol (PROVENTIL HFA) 108 (90 BASE) MCG/ACT inhaler Inhale 2 puffs into the lungs every 6 (six) hours as needed for wheezing. 08/20/12   Camelia Eng Tysinger, PA-C  amLODipine  (NORVASC) 10 MG tablet TAKE 1 TABLET (10 MG TOTAL) BY MOUTH DAILY. 01/13/16   [place] Lions, PA-C  colesevelam [facility]) 625 MG tablet Take 3 tablets twice daily 01/12/14   Rita Ohara, MD  cyclobenzaprine (FLEXERIL) 10 MG tablet Take 1 tablet (10 mg total) by mouth 2 (two) times daily as needed for muscle spasms. 01/13/16   Ava Lions, PA-C  ergocalciferol (VITAMIN D2) 50000 UNITS capsule Take 1 capsule (50,000 Units total) by mouth once a week. 01/16/14   Rita Ohara, MD  hydrochlorothiazide (HYDRODIURIL) 25 MG tablet Take 1 tablet (25 mg total) by mouth daily. 01/13/16   [place] Lions, PA-C  metFORMIN (GLUCOPHAGE) 500 MG tablet 1000mg  prior to breakfast, 500mg  prior to dinner 01/12/14   Rita Ohara, MD  Multiple Vitamins-Minerals (MULTIVITAMIN GUMMIES ADULT PO) Take 1 each by mouth daily.    Historical Provider, MD  naproxen (NAPROSYN) 500 MG tablet Take 1 tablet (500 mg total) by mouth 2 (two) times daily. 01/13/16   [place] Lions, PA-C   BP 183/89 mmHg  Pulse 88  Temp(Src) 98.1 F (36.7 C) (Oral)  Resp 14  Ht 5\' 4"  (1.626 m)  Wt 118.661 kg  BMI 44.88 kg/m2  SpO2 99% Physical Exam  Constitutional: She appears well-developed and well-nourished. No distress.  HENT:  Head: Normocephalic and atraumatic.  Mouth/Throat: Oropharynx is clear and moist. No oropharyngeal exudate.  Eyes: Conjunctivae are normal. Pupils are equal, round, and reactive to light. Right eye exhibits no discharge. Left eye exhibits no discharge. No scleral icterus.  Neck: No tracheal deviation present.  Cardiovascular: Normal rate, regular rhythm, normal heart sounds and intact distal pulses.  Exam reveals no gallop and no friction rub.   No murmur heard. Pulmonary/Chest: Effort normal and breath sounds normal. No respiratory distress. She has no wheezes. She has no rales. She exhibits no tenderness.  Abdominal: Soft. Bowel sounds are normal. She exhibits no distension and no mass. There is no  tenderness. There is no rebound and no guarding.  Musculoskeletal: She exhibits no edema.       Arms: Left paraspinal tenderness. No midline cervical, thoracic, lumbar tenderness. NVI BL.   Lymphadenopathy:    She has no cervical adenopathy.  Neurological: She is alert. Coordination normal.  Skin: Skin is warm and dry. No rash noted. She is not diaphoretic. No erythema.  Psychiatric: She has a normal mood and affect. Her behavior is normal.  Nursing note and vitals reviewed.   ED Course  Procedures  Labs Review Labs Reviewed  URINALYSIS, ROUTINE W REFLEX MICROSCOPIC (NOT AT [facility]) - Abnormal; Notable for the following:    Specific Gravity, Urine 1.031 (*)    Glucose, UA >1000 (*)    All other components within normal limits  URINE MICROSCOPIC-ADD ON - Abnormal; Notable for the following:    Squamous Epithelial / LPF 0-5 (*)    Bacteria, UA RARE (*)    All other components within normal limits  GC/CHLAMYDIA PROBE AMP ([place]) NOT AT [facility]     MDM   Final diagnoses:  Left-sided thoracic back pain  Trichomonas infection   Patient with back pain.  No neurological deficits and normal neuro exam.  Patient can walk but states is painful.  No loss of bowel or bladder control.  No concern for cauda equina.  No fever, night sweats, weight loss, h/o cancer, IVDU.  RICE protocol and symptomatic treatment discussed with patient.  UA with glucosuria and trich. Encouraged PCP followup for DM management. Patient treated with flagyl. Will hold off on further STI treatment today as there were no urinary/vaginal complaints and no WBCs in urine. Patient requesting home med refills. Patient may be safely discharged home. Discussed reasons for return. Patient to follow-up with primary care provider within one week. Patient in understanding and agreement with the plan.    [place] Lions, PA-C 01/23/16 [place], DO 01/24/16 1600

## 2016-01-13 NOTE — ED Notes (Signed)
Declined W/C at D/C and was escorted to lobby by RN. 

## 2016-01-13 NOTE — Discharge Instructions (Signed)
Ms. Tiffany Le,  Nice meeting you! Please follow-up with your primary care provider. Please contact them regarding your diabetes and blood pressure management. Return to the emergency department if you develop headaches, chest pain, shortness of breath, abdominal pain, inability to urinate, new/worsening symptoms. Feel better soon!  S. Wendie Simmer, PA-C

## 2016-01-13 NOTE — ED Notes (Signed)
She c/o 1 week history of lower back pain. She denies any noted activity at onset. She has not tried anything at home for the pain. She denies urinary changes, n/v, fevers. Her BP is elevated, she does not currently have insurance and has been taking her BP medication every 3 days to try to make it last longer. She is alert and breathing easily

## 2016-01-14 LAB — GC/CHLAMYDIA PROBE AMP (~~LOC~~) NOT AT ARMC
CHLAMYDIA, DNA PROBE: NEGATIVE
NEISSERIA GONORRHEA: NEGATIVE

## 2016-04-22 ENCOUNTER — Emergency Department (HOSPITAL_COMMUNITY)
Admission: EM | Admit: 2016-04-22 | Discharge: 2016-04-22 | Disposition: A | Discharge status: Home / Self Care | Payer: Self-pay | Attending: Emergency Medicine | Admitting: Emergency Medicine

## 2016-04-22 ENCOUNTER — Encounter (HOSPITAL_COMMUNITY): Payer: Self-pay | Admitting: Emergency Medicine

## 2016-04-22 DIAGNOSIS — I10 Essential (primary) hypertension: Secondary | ICD-10-CM | POA: Insufficient documentation

## 2016-04-22 DIAGNOSIS — M545 Low back pain, unspecified: Secondary | ICD-10-CM

## 2016-04-22 DIAGNOSIS — Y999 Unspecified external cause status: Secondary | ICD-10-CM | POA: Insufficient documentation

## 2016-04-22 DIAGNOSIS — Y939 Activity, unspecified: Secondary | ICD-10-CM | POA: Insufficient documentation

## 2016-04-22 DIAGNOSIS — E119 Type 2 diabetes mellitus without complications: Secondary | ICD-10-CM | POA: Insufficient documentation

## 2016-04-22 DIAGNOSIS — M62830 Muscle spasm of back: Secondary | ICD-10-CM

## 2016-04-22 DIAGNOSIS — X509XXA Other and unspecified overexertion or strenuous movements or postures, initial encounter: Secondary | ICD-10-CM | POA: Insufficient documentation

## 2016-04-22 DIAGNOSIS — Z7984 Long term (current) use of oral hypoglycemic drugs: Secondary | ICD-10-CM | POA: Insufficient documentation

## 2016-04-22 DIAGNOSIS — Y92810 Car as the place of occurrence of the external cause: Secondary | ICD-10-CM | POA: Insufficient documentation

## 2016-04-22 DIAGNOSIS — Z79899 Other long term (current) drug therapy: Secondary | ICD-10-CM | POA: Insufficient documentation

## 2016-04-22 DIAGNOSIS — S39012A Strain of muscle, fascia and tendon of lower back, initial encounter: Secondary | ICD-10-CM

## 2016-04-22 NOTE — ED Notes (Signed)
C/O central LBP x 3 days. Onset while getting out of car. No radiation or paresthesias.

## 2016-04-22 NOTE — ED Provider Notes (Signed)
[facility] DEPT Provider Note   CSN: [id number] Arrival date & time: 04/22/16  1216  By signing my name below, I, Rayna Sexton, attest that this documentation has been prepared under the direction and in the presence of Kristien Salatino Camprubi-Soms, PA-C. Electronically Signed: Rayna Sexton, ED Scribe. 04/22/16. 1:48 PM.   History   Chief Complaint Chief Complaint  Patient presents with  . Back Pain    HPI HPI Comments: Tiffany Le is a 40 y.o. female with a PMHx of DM2, HTN, HLD, anemia, and obesity, who presents to the Emergency Department complaining of R low back pain x 3 days. Describes it as 9/10 constant aching/stiff, non-radiating, right lower back pain. She states she was exiting her vehicle and while making a twisting motion began experiencing her pain. Her pain worsens with ambulation and long periods of standing. She has taken flexeril w/o significant relief. She also has an rx for naproxen but denies having taken any. Hasn't tried anything else for her symptoms.  Pt denies a h/o IVDA or CA. Pt does not have a current PCP. She denies fevers, chills, CP, SOB, abd pain, n/v/d/c, dysuria, hematuria, bowel/bladder incontinence, saddle anesthesia or cauda equina symptoms, numbness, tingling, weakness, or any other complaints.   The history is provided by the patient and medical records. No language interpreter was used.  Back Pain   This is a new problem. The current episode started more than 2 days ago. The problem occurs constantly. The problem has not changed since onset.The pain is associated with twisting. The pain is present in the lumbar spine. The quality of the pain is described as aching. The pain does not radiate. The pain is at a severity of 9/10. The pain is moderate. The symptoms are aggravated by bending, twisting and certain positions. The pain is the same all the time. Stiffness is present all day. Pertinent negatives include no chest pain, no fever, no numbness, no  abdominal pain, no bowel incontinence, no perianal numbness, no bladder incontinence, no dysuria, no paresthesias, no tingling and no weakness. She has tried muscle relaxants for the symptoms. The treatment provided no relief. Risk factors include obesity.    Past Medical History:  Diagnosis Date  . Diabetes mellitus without complication ([place])   . Hyperlipidemia age 15  . Hypertension age 57  . Iron deficiency anemia   . Obesity     Patient Active Problem List   Diagnosis Date Noted  . Elevated LFTs 01/13/2014  . Unspecified vitamin D deficiency 01/13/2014  . Type II or unspecified type diabetes mellitus without mention of complication, uncontrolled 11/03/2013  . Anemia, iron deficiency 11/17/2012  . Essential hypertension, benign 11/17/2012  . Pure hypercholesterolemia 11/17/2012  . Morbid obesity with BMI of 45.0-49.9, adult ([place]) 11/17/2012    Past Surgical History:  Procedure Laterality Date  . CESAREAN SECTION  3/96  . ORIF FEMUR FRACTURE      OB History    Gravida Para Term Preterm AB Living   1 1       1    SAB TAB Ectopic Multiple Live Births                   Home Medications    Prior to Admission medications   Medication Sig Start Date End Date Taking? Authorizing Provider  albuterol (PROVENTIL HFA) 108 (90 BASE) MCG/ACT inhaler Inhale 2 puffs into the lungs every 6 (six) hours as needed for wheezing. 08/20/12   Camelia Eng Tysinger, PA-C  amLODipine ([place])  10 MG tablet TAKE 1 TABLET (10 MG TOTAL) BY MOUTH DAILY. 01/13/16   [place] Lions, PA-C  colesevelam [facility]) 625 MG tablet Take 3 tablets twice daily 01/12/14   Rita Ohara, MD  cyclobenzaprine (FLEXERIL) 10 MG tablet Take 1 tablet (10 mg total) by mouth 2 (two) times daily as needed for muscle spasms. 01/13/16   [place] Lions, PA-C  ergocalciferol (VITAMIN D2) 50000 UNITS capsule Take 1 capsule (50,000 Units total) by mouth once a week. 01/16/14   Rita Ohara, MD  hydrochlorothiazide (HYDRODIURIL) 25 MG  tablet Take 1 tablet (25 mg total) by mouth daily. 01/13/16   [place] Lions, PA-C  metFORMIN (GLUCOPHAGE) 500 MG tablet 1000mg  prior to breakfast, 500mg  prior to dinner 01/12/14   Rita Ohara, MD  Multiple Vitamins-Minerals (MULTIVITAMIN GUMMIES ADULT PO) Take 1 each by mouth daily.    Historical Provider, MD  naproxen (NAPROSYN) 500 MG tablet Take 1 tablet (500 mg total) by mouth 2 (two) times daily. 01/13/16   [place] Lions, PA-C    Family History Family History  Problem Relation Age of Onset  . Heart disease Mother     CABG <50  . Hypertension Mother   . Diabetes Mother   . Hyperlipidemia Mother   . Heart disease Father   . Hyperlipidemia Father   . Hypertension Father   . Early death Father   . Healthy Daughter   . Diabetes Maternal Aunt   . Cancer Maternal Grandmother     stomach cancer  . Diabetes Maternal Aunt   . Cancer Cousin     colon cancer (dx'd 44)    Social History Social History  Substance Use Topics  . Smoking status: Never Smoker  . Smokeless tobacco: Never Used  . Alcohol use No     Allergies   Penicillins   Review of Systems Review of Systems  Constitutional: Negative for chills and fever.  Respiratory: Negative for shortness of breath.   Cardiovascular: Negative for chest pain.  Gastrointestinal: Negative for abdominal pain, bowel incontinence, constipation, diarrhea, nausea and vomiting.  Genitourinary: Negative for bladder incontinence, difficulty urinating (no incontinence), dysuria and hematuria.  Musculoskeletal: Positive for back pain. Negative for arthralgias and myalgias.  Skin: Negative for color change.  Allergic/Immunologic: Positive for immunocompromised state (diabetic).  Neurological: Negative for tingling, weakness, numbness and paresthesias.  Psychiatric/Behavioral: Negative for confusion.  A complete 10 system review of systems was obtained and all systems are negative except as noted in the HPI and PMH.    Physical  Exam Updated Vital Signs BP 164/99   Pulse 100   Temp 98.6 F (37 C) (Oral)   Resp 18   LMP 04/12/2016   SpO2 100%   Physical Exam  Constitutional: She is oriented to person, place, and time. Vital signs are normal. She appears well-developed and well-nourished.  Non-toxic appearance. No distress.  Afebrile, nontoxic, NAD  HENT:  Head: Normocephalic and atraumatic.  Mouth/Throat: Mucous membranes are normal.  Eyes: Conjunctivae and EOM are normal. Right eye exhibits no discharge. Left eye exhibits no discharge.  Neck: Normal range of motion. Neck supple.  Cardiovascular: Normal rate and intact distal pulses.   Pulmonary/Chest: Effort normal. No respiratory distress.  Abdominal: Normal appearance. She exhibits no distension.  Musculoskeletal: Normal range of motion.       Lumbar back: She exhibits tenderness and spasm. She exhibits normal range of motion, no bony tenderness and no deformity.  Lumbar spine with FROM intact without spinous process TTP,  no bony stepoffs or deformities, with mild right sided paraspinous muscle TTP and muscle spasms. Strength and sensation grossly intact in all extremities, negative SLR bilaterally, gait steady and nonantalgic. No overlying skin changes. Distal pulses intact.   Neurological: She is alert and oriented to person, place, and time. She has normal strength. No sensory deficit.  Skin: Skin is warm, dry and intact. No rash noted.  Psychiatric: She has a normal mood and affect. Her behavior is normal.  Nursing note and vitals reviewed.   ED Treatments / Results  Labs (all labs ordered are listed, but only abnormal results are displayed) Labs Reviewed - No data to display  EKG  EKG Interpretation None       Radiology No results found.  Procedures Procedures  DIAGNOSTIC STUDIES: Oxygen Saturation is 100% on RA, normal by my interpretation.    COORDINATION OF CARE: 1:36 PM Discussed next steps with pt. Pt verbalized understanding  and is agreeable with the plan.    Medications Ordered in ED Medications - No data to display   Initial Impression / Assessment and Plan / ED Course  I have reviewed the triage vital signs and the nursing notes.  Pertinent labs & imaging results that were available during my care of the patient were reviewed by me and considered in my medical decision making (see chart for details).  Clinical Course    40 y.o. female here with low back pain since twisting it to get out of her car 3 days ago. Mild tenderness to R paraspinous muscle region, no midline tenderness. No red flag s/s of low back pain. No s/s of central cord compression or cauda equina. Lower extremities are neurovascularly intact and patient is ambulating without difficulty. No urinary complaints. Doubt need for imaging or labs.   Patient was counseled on back pain precautions and told to do activity as tolerated but do not lift, push, or pull heavy objects more than 10 pounds for the next week. Patient counseled to use ice or heat on back for no longer than 15 minutes every hour.   Pt has rx for muscle relaxer and counseled on proper use of muscle relaxant medication. Urged patient not to drink alcohol, drive, or perform any other activities that requires focus while taking these medications. Has Rx for naprosyn, discussed use of this. Tylenol PRN additional pain relief.   Patient urged to follow-up with [facility] in 1wk to establish care and recheck symptoms, and if pain does not improve with treatment and rest or if pain becomes recurrent. Urged to return with worsening severe pain, loss of bowel or bladder control, trouble walking. The patient verbalizes understanding and agrees with the plan.   I personally performed the services described in this documentation, which was scribed in my presence. The recorded information has been reviewed and is accurate.  Final Clinical Impressions(s) / ED Diagnoses   Final diagnoses:    Right-sided low back pain without sciatica  Low back strain, initial encounter  Muscle spasm of back    New Prescriptions New Prescriptions   No medications on file     Zacarias Pontes, PA-C 04/22/16 1354    Nat Christen, MD 04/23/16 1703

## 2016-04-22 NOTE — Discharge Instructions (Signed)
Back Pain: Your back pain should be treated with medicines such as ibuprofen or aleve and this back pain should get better over the next 2 weeks.  However if you develop severe or worsening pain, low back pain with fever, numbness, weakness or inability to walk or urinate, you should return to the ER immediately.  Please follow up with your doctor this week for a recheck if still having symptoms.  Avoid heavy lifting over 10 pounds over the next two weeks.  Low back pain is discomfort in the lower back that may be due to injuries to muscles and ligaments around the spine.  Occasionally, it may be caused by a a problem to a part of the spine called a disc.  The pain may last several days or a week;  However, most patients get completely well in 4 weeks.  Self - care:  The application of heat can help soothe the pain.  Maintaining your daily activities, including walking, is encourged, as it will help you get better faster than just staying in bed. Perform gentle stretching as discussed. Drink plenty of fluids.  Medications are also useful to help with pain control.  A commonly prescribed medication includes tylenol. Take as directed on the over the counter bottle.  Non steroidal anti inflammatory medications including Ibuprofen and naproxen;  These medications help both pain and swelling and are very useful in treating back pain.  They should be taken with food, as they can cause stomach upset, and more seriously, stomach bleeding.  Consider taking your home naprosyn twice daily with meals for the next week(instead of just as needed, take it on a schedule)  Muscle relaxants (home flexeril):  These medications can help with muscle tightness that is a cause of lower back pain.  Most of these medications can cause drowsiness, and it is not safe to drive or use dangerous machinery while taking them.  SEEK IMMEDIATE MEDICAL ATTENTION IF: New numbness, tingling, weakness, or problem with the use of your arms  or legs.  Severe back pain not relieved with medications.  Difficulty with or loss of control of your bowel or bladder control.  Increasing pain in any areas of the body (such as chest or abdominal pain).  Shortness of breath, dizziness or fainting.  Nausea (feeling sick to your stomach), vomiting, fever, or sweats.  You will need to follow up with  [facility] in 1-2 weeks for reassessment and to establish medical care

## 2016-04-22 NOTE — ED Triage Notes (Signed)
Pt from home with c/o lower back pain starting this past Sat as pt was getting out of her car.  Pain is worse with any kind of movement.  Ambulatory to room.  NAD, A&O.

## 2016-04-22 NOTE — ED Notes (Signed)
Pt given 2 large ice packs.

## 2017-04-16 ENCOUNTER — Emergency Department (HOSPITAL_COMMUNITY): Payer: Self-pay

## 2017-04-16 ENCOUNTER — Encounter (HOSPITAL_COMMUNITY): Payer: Self-pay | Admitting: *Deleted

## 2017-04-16 ENCOUNTER — Emergency Department (HOSPITAL_COMMUNITY)
Admission: EM | Admit: 2017-04-16 | Discharge: 2017-04-16 | Disposition: A | Discharge status: Home / Self Care | Payer: Self-pay | Attending: Emergency Medicine | Admitting: Emergency Medicine

## 2017-04-16 DIAGNOSIS — Z79899 Other long term (current) drug therapy: Secondary | ICD-10-CM | POA: Insufficient documentation

## 2017-04-16 DIAGNOSIS — N201 Calculus of ureter: Secondary | ICD-10-CM | POA: Insufficient documentation

## 2017-04-16 DIAGNOSIS — Z7982 Long term (current) use of aspirin: Secondary | ICD-10-CM | POA: Insufficient documentation

## 2017-04-16 DIAGNOSIS — R1110 Vomiting, unspecified: Secondary | ICD-10-CM | POA: Insufficient documentation

## 2017-04-16 DIAGNOSIS — I10 Essential (primary) hypertension: Secondary | ICD-10-CM | POA: Insufficient documentation

## 2017-04-16 DIAGNOSIS — E119 Type 2 diabetes mellitus without complications: Secondary | ICD-10-CM | POA: Insufficient documentation

## 2017-04-16 DIAGNOSIS — R42 Dizziness and giddiness: Secondary | ICD-10-CM | POA: Insufficient documentation

## 2017-04-16 DIAGNOSIS — N12 Tubulo-interstitial nephritis, not specified as acute or chronic: Secondary | ICD-10-CM | POA: Insufficient documentation

## 2017-04-16 DIAGNOSIS — Z7984 Long term (current) use of oral hypoglycemic drugs: Secondary | ICD-10-CM | POA: Insufficient documentation

## 2017-04-16 LAB — URINALYSIS, ROUTINE W REFLEX MICROSCOPIC
BILIRUBIN URINE: NEGATIVE
Glucose, UA: >=500 mg/dL — AB
Ketones, ur: 20 mg/dL — AB
NITRITE: POSITIVE — AB
PH: 5 (ref 5.0–8.0)
Protein, ur: 100 mg/dL — AB
SPECIFIC GRAVITY, URINE: 1.027 (ref 1.005–1.030)

## 2017-04-16 LAB — CBC
HCT: 41 % (ref 36.0–46.0)
Hemoglobin: 12.9 g/dL (ref 12.0–15.0)
MCH: 26.7 pg (ref 26.0–34.0)
MCHC: 31.5 g/dL (ref 30.0–36.0)
MCV: 84.7 fL (ref 78.0–100.0)
PLATELETS: 294 10*3/uL (ref 150–400)
RBC: 4.84 MIL/uL (ref 3.87–5.11)
RDW: 12.6 % (ref 11.5–15.5)
WBC: 11.8 10*3/uL — AB (ref 4.0–10.5)

## 2017-04-16 LAB — COMPREHENSIVE METABOLIC PANEL
ALT: 107 U/L — AB (ref 14–54)
AST: 52 U/L — AB (ref 15–41)
Albumin: 3.9 g/dL (ref 3.5–5.0)
Alkaline Phosphatase: 91 U/L (ref 38–126)
Anion gap: 13 (ref 5–15)
BILIRUBIN TOTAL: 0.5 mg/dL (ref 0.3–1.2)
BUN: 11 mg/dL (ref 6–20)
CALCIUM: 9.1 mg/dL (ref 8.9–10.3)
CO2: 21 mmol/L — ABNORMAL LOW (ref 22–32)
CREATININE: 0.7 mg/dL (ref 0.44–1.00)
Chloride: 99 mmol/L — ABNORMAL LOW (ref 101–111)
GFR calc Af Amer: >60 mL/min (ref >60)
Glucose, Bld: 314 mg/dL — ABNORMAL HIGH (ref 65–99)
Potassium: 4.7 mmol/L (ref 3.5–5.1)
Sodium: 133 mmol/L — ABNORMAL LOW (ref 135–145)
TOTAL PROTEIN: 8.5 g/dL — AB (ref 6.5–8.1)

## 2017-04-16 LAB — LIPASE, BLOOD: Lipase: 26 U/L (ref 11–51)

## 2017-04-16 LAB — I-STAT BETA HCG BLOOD, ED (MC, WL, AP ONLY): I-stat hCG, quantitative: <5 m[IU]/mL (ref <5)

## 2017-04-16 LAB — CBG MONITORING, ED: Glucose-Capillary: 300 mg/dL — ABNORMAL HIGH (ref 65–99)

## 2017-04-16 MED ORDER — ONDANSETRON HCL 4 MG PO TABS: 4.0000 mg | ORAL_TABLET | Freq: Three times a day (TID) | ORAL | 0 refills | Status: DC | PRN

## 2017-04-16 MED ORDER — IBUPROFEN 600 MG PO TABS: 600.0000 mg | ORAL_TABLET | Freq: Four times a day (QID) | ORAL | 0 refills | Status: DC | PRN

## 2017-04-16 MED ORDER — HYDROCODONE-ACETAMINOPHEN 5-325 MG PO TABS: 1.0000 | ORAL_TABLET | Freq: Four times a day (QID) | ORAL | 0 refills | Status: DC | PRN

## 2017-04-16 MED ORDER — KETOROLAC TROMETHAMINE 30 MG/ML IJ SOLN
30.0000 mg | Freq: Once | INTRAMUSCULAR | Status: AC
  Administered 2017-04-16: 30 mg via INTRAVENOUS
  Filled 2017-04-16: qty 1

## 2017-04-16 MED ORDER — SODIUM CHLORIDE 0.9 % IV BOLUS (SEPSIS)
1000.0000 mL | Freq: Once | INTRAVENOUS | Status: AC
  Administered 2017-04-16: 1000 mL via INTRAVENOUS

## 2017-04-16 MED ORDER — DEXTROSE 5 % IV SOLN
1.0000 g | Freq: Once | INTRAVENOUS | Status: AC
  Administered 2017-04-16: 1 g via INTRAVENOUS
  Filled 2017-04-16: qty 10

## 2017-04-16 MED ORDER — AMLODIPINE BESYLATE 5 MG PO TABS: 10.0000 mg | ORAL_TABLET | Freq: Every day | ORAL | 1 refills | Status: DC

## 2017-04-16 MED ORDER — CIPROFLOXACIN HCL 500 MG PO TABS: 500.0000 mg | ORAL_TABLET | Freq: Two times a day (BID) | ORAL | 0 refills | Status: AC

## 2017-04-16 MED ORDER — MORPHINE SULFATE (PF) 4 MG/ML IV SOLN
4.0000 mg | Freq: Once | INTRAVENOUS | Status: AC
  Administered 2017-04-16: 4 mg via INTRAVENOUS
  Filled 2017-04-16: qty 1

## 2017-04-16 MED ORDER — METFORMIN HCL 1000 MG PO TABS: 1000.0000 mg | ORAL_TABLET | Freq: Two times a day (BID) | ORAL | 1 refills | Status: DC

## 2017-04-16 MED ORDER — HYDROCHLOROTHIAZIDE 25 MG PO TABS: 25.0000 mg | ORAL_TABLET | Freq: Every day | ORAL | 1 refills | Status: DC

## 2017-04-16 MED ORDER — ONDANSETRON HCL 4 MG/2ML IJ SOLN
4.0000 mg | Freq: Once | INTRAMUSCULAR | Status: AC
  Administered 2017-04-16: 4 mg via INTRAVENOUS
  Filled 2017-04-16: qty 2

## 2017-04-16 NOTE — ED Triage Notes (Signed)
Pt states that she has had rt sided abdominal pain N/V starting this morning. Pt states that she has also had dizziness that is worse with standing. Pt ambulatory in triage.

## 2017-04-16 NOTE — ED Notes (Signed)
Patient given discharge instructions and verbalized understanding.  Patient stable to discharge at this time.  Patient is alert and oriented to baseline.  No distressed noted at this time.  All belongings taken with the patient at discharge.   

## 2017-04-16 NOTE — Discharge Instructions (Signed)
Please call Dr. Wandra Mannan office tomorrow for close urology follow-up You have a right kidney infection, and there is possible small 2-3 mm stone on the right side.  Please return without fail for worsening symptoms, including fever, worsening pain, intractable vomiting or any other symptoms concerning to you.

## 2017-04-16 NOTE — ED Notes (Signed)
Pt transported to CT ?

## 2017-04-16 NOTE — ED Provider Notes (Addendum)
[facility] DEPT Provider Note   CSN: [id number] Arrival date & time: 04/16/17  0900     History   Chief Complaint Chief Complaint  Patient presents with  . Emesis  . Dizziness    HPI Tiffany Le is a 41 y.o. female.  The history is provided by the patient.  Emesis   This is a new problem. The current episode started 3 to 5 hours ago. The problem occurs 2 to 4 times per day. The problem has not changed since onset.The emesis has an appearance of stomach contents. There has been no fever. Associated symptoms include abdominal pain and chills. Pertinent negatives include no cough, no fever and no URI. Risk factors include ill contacts.   41 year old female who presents with right-sided flank and abdominal pain with nausea and vomiting starting at 5 AM in the morning. She has a history of diabetes, hypertension and hyperlipidemia with prior cesarean section. States that she has had urinary urgency, frequency, and hesitancy during this time. Also endorses dysuria. No fever but having some chills. Has had nonbloody non-bilious emesis. Denies diarrhea, abnormal vaginal bleeding or discharge. No chest pain or difficulty breathing. His not taking any medications for symptoms. Pain is worse with palpation, no alleviating factors.   Past Medical History:  Diagnosis Date  . Diabetes mellitus without complication ([facility])   . Hyperlipidemia age 59  . Hypertension age 17  . Iron deficiency anemia   . Obesity     Patient Active Problem List   Diagnosis Date Noted  . Elevated LFTs 01/13/2014  . Unspecified vitamin D deficiency 01/13/2014  . Type II or unspecified type diabetes mellitus without mention of complication, uncontrolled 11/03/2013  . Anemia, iron deficiency 11/17/2012  . Essential hypertension, benign 11/17/2012  . Pure hypercholesterolemia 11/17/2012  . Morbid obesity with BMI of 45.0-49.9, adult ([place]) 11/17/2012    Past Surgical History:  Procedure Laterality Date  .  CESAREAN SECTION  3/96  . ORIF FEMUR FRACTURE      OB History    Gravida Para Term Preterm AB Living   1 1       1    SAB TAB Ectopic Multiple Live Births                   Home Medications    Prior to Admission medications   Medication Sig Start Date End Date Taking? Authorizing Provider  albuterol (PROVENTIL HFA) 108 (90 BASE) MCG/ACT inhaler Inhale 2 puffs into the lungs every 6 (six) hours as needed for wheezing. 08/20/12  Yes Tysinger, Camelia Eng, PA-C  amLODipine (NORVASC) 10 MG tablet TAKE 1 TABLET (10 MG TOTAL) BY MOUTH DAILY. 01/13/16  Yes [place] Lions, PA-C  aspirin EC 81 MG tablet Take 81 mg by mouth daily.   Yes [provider]  cyclobenzaprine (FLEXERIL) 10 MG tablet Take 1 tablet (10 mg total) by mouth 2 (two) times daily as needed for muscle spasms. 01/13/16  Yes [place] Lions, PA-C  hydrochlorothiazide (HYDRODIURIL) 25 MG tablet Take 1 tablet (25 mg total) by mouth daily. 01/13/16  Yes [place] Lions, PA-C  ibuprofen (ADVIL,MOTRIN) 200 MG tablet Take 400 mg by mouth every 6 (six) hours as needed for mild pain.   Yes [provider]  metFORMIN (GLUCOPHAGE) 500 MG tablet 1000mg  prior to breakfast, 500mg  prior to dinner Patient taking differently: Take 500 mg by mouth daily. 1000mg  prior to breakfast, 500mg  prior to dinner 01/12/14  Yes Rita Ohara, MD  amLODipine [facility])  5 MG tablet Take 2 tablets (10 mg total) by mouth daily. 04/16/17   Forde Dandy, MD  ciprofloxacin (CIPRO) 500 MG tablet Take 1 tablet (500 mg total) by mouth every 12 (twelve) hours. 04/16/17 04/30/17  Forde Dandy, MD  colesevelam [facility]) 625 MG tablet Take 3 tablets twice daily Patient not taking: Reported on 04/16/2017 01/12/14   Rita Ohara, MD  ergocalciferol (VITAMIN D2) 50000 UNITS capsule Take 1 capsule (50,000 Units total) by mouth once a week. Patient not taking: Reported on 04/16/2017 01/16/14   Rita Ohara, MD  hydrochlorothiazide (HYDRODIURIL) 25 MG tablet Take 1  tablet (25 mg total) by mouth daily. 04/16/17   Forde Dandy, MD  HYDROcodone-acetaminophen (NORCO/VICODIN) 5-325 MG tablet Take 1 tablet by mouth every 6 (six) hours as needed. 04/16/17   Forde Dandy, MD  ibuprofen (ADVIL,MOTRIN) 600 MG tablet Take 1 tablet (600 mg total) by mouth every 6 (six) hours as needed. 04/16/17   Forde Dandy, MD  metFORMIN (GLUCOPHAGE) 1000 MG tablet Take 1 tablet (1,000 mg total) by mouth 2 (two) times daily. 04/16/17   Forde Dandy, MD  naproxen (NAPROSYN) 500 MG tablet Take 1 tablet (500 mg total) by mouth 2 (two) times daily. Patient not taking: Reported on 04/16/2017 01/13/16   [place] Lions, PA-C  ondansetron (ZOFRAN) 4 MG tablet Take 1 tablet (4 mg total) by mouth every 8 (eight) hours as needed for nausea or vomiting. 04/16/17   Forde Dandy, MD    Family History Family History  Problem Relation Age of Onset  . Heart disease Mother        CABG <50  . Hypertension Mother   . Diabetes Mother   . Hyperlipidemia Mother   . Heart disease Father   . Hyperlipidemia Father   . Hypertension Father   . Early death Father   . Healthy Daughter   . Diabetes Maternal Aunt   . Cancer Maternal Grandmother        stomach cancer  . Diabetes Maternal Aunt   . Cancer Cousin        colon cancer (dx'd 75)    Social History Social History  Substance Use Topics  . Smoking status: Never Smoker  . Smokeless tobacco: Never Used  . Alcohol use No     Allergies   Penicillins   Review of Systems Review of Systems  Constitutional: Positive for chills. Negative for fever.  Respiratory: Negative for cough.   Gastrointestinal: Positive for abdominal pain.  All other systems reviewed and are negative.    Physical Exam Updated Vital Signs BP (!) 173/94   Pulse (!) 107   Temp 98.2 F (36.8 C) (Oral)   Resp 16   SpO2 100%   Physical Exam Physical Exam  Nursing note and vitals reviewed. Constitutional: Well developed, well nourished, non-toxic, and in no  acute distress Head: Normocephalic and atraumatic.  Mouth/Throat: Oropharynx is clear and moist.  Neck: Normal range of motion. Neck supple.  Cardiovascular: Normal rate and regular rhythm.   Pulmonary/Chest: Effort normal and breath sounds normal.  Abdominal: Soft. There is right sided tenderness. There is no rebound and no guarding. Mild right CVA tenderness Musculoskeletal: Normal range of motion.  Neurological: Alert, no facial droop, fluent speech, moves all extremities symmetrically Skin: Skin is warm and dry.  Psychiatric: Cooperative   ED Treatments / Results  Labs (all labs ordered are listed, but only abnormal results are displayed) Labs Reviewed  COMPREHENSIVE METABOLIC  PANEL - Abnormal; Notable for the following:       Result Value   Sodium 133 (*)    Chloride 99 (*)    CO2 21 (*)    Glucose, Bld 314 (*)    Total Protein 8.5 (*)    AST 52 (*)    ALT 107 (*)    All other components within normal limits  CBC - Abnormal; Notable for the following:    WBC 11.8 (*)    All other components within normal limits  URINALYSIS, ROUTINE W REFLEX MICROSCOPIC - Abnormal; Notable for the following:    APPearance CLOUDY (*)    Glucose, UA >=500 (*)    Hgb urine dipstick LARGE (*)    Ketones, ur 20 (*)    Protein, ur 100 (*)    Nitrite POSITIVE (*)    Leukocytes, UA LARGE (*)    Bacteria, UA FEW (*)    Squamous Epithelial / LPF 0-5 (*)    All other components within normal limits  CBG MONITORING, ED - Abnormal; Notable for the following:    Glucose-Capillary 300 (*)    All other components within normal limits  URINE CULTURE  LIPASE, BLOOD  I-STAT BETA HCG BLOOD, ED ([facility], [facility], [facility] ONLY)    EKG  EKG Interpretation  Date/Time:  Thursday April 16 2017 09:08:12 EDT Ventricular Rate:  115 PR Interval:  146 QRS Duration: 76 QT Interval:  336 QTC Calculation: 464 R Axis:   -34 Text Interpretation:  Sinus tachycardia Possible Left atrial enlargement Left axis deviation  Left ventricular hypertrophy with repolarization abnormality Abnormal ECG no prior EKG  Confirmed by Brantley Stage [phone]) on 04/16/2017 11:23:31 AM       Radiology Ct Renal Stone Study  Result Date: 04/16/2017 CLINICAL DATA:  Right flank pain and fever for 2 days. EXAM: CT ABDOMEN AND PELVIS WITHOUT CONTRAST TECHNIQUE: Multidetector CT imaging of the abdomen and pelvis was performed following the standard protocol without IV contrast. COMPARISON:  None. FINDINGS: Lower chest: Lung bases are clear.  No pleural effusion. Hepatobiliary: No focal liver abnormality is seen. No gallstones, gallbladder wall thickening, or biliary dilatation. Pancreas: Unremarkable. No pancreatic ductal dilatation or surrounding inflammatory changes. Spleen: Normal in size without focal abnormality. Adrenals/Urinary Tract: The adrenal glands are normal. The patient has mild to moderate right hydronephrosis. The distal right ureter is difficult to trace to the bladder. The patient has multiple calcifications in the pelvis and one of these is likely a distal right ureteral stone. Although it is difficult to determine which calcification is a ureteral stone, it is thought to be a 0.3 cm calcification on image 70 of series 4. No other urinary tract stones are identified. The left kidney appears normal. Stomach/Bowel: The stomach and small and large bowel appear normal. No evidence of appendicitis. Vascular/Lymphatic: No significant vascular findings are present. No enlarged abdominal or pelvic lymph nodes. Reproductive: Uterus and bilateral adnexa are unremarkable. Other:  No fluid collection or hernia. Musculoskeletal: No acute bony abnormality. There is partial visualization of a nail in the right femur. IMPRESSION: Mild to moderate right hydronephrosis. The distal right ureter is difficult to trace to the bladder but there is a 0.3 cm calcification in the pelvis thought to be a distal right ureteral stone. The patient has multiple pelvic  phleboliths. Electronically Signed   By: Inge Rise M.D.   On: 04/16/2017 13:59    Procedures Procedures (including critical care time)  Medications Ordered in ED Medications  sodium chloride 0.9 % bolus 1,000 mL (0 mLs Intravenous Stopped 04/16/17 1333)  cefTRIAXone (ROCEPHIN) 1 g in dextrose 5 % 50 mL IVPB (0 g Intravenous Stopped 04/16/17 1225)  ondansetron (ZOFRAN) injection 4 mg (4 mg Intravenous Given 04/16/17 1155)  morphine 4 MG/ML injection 4 mg (4 mg Intravenous Given 04/16/17 1155)  sodium chloride 0.9 % bolus 1,000 mL (1,000 mLs Intravenous New Bag/Given 04/16/17 1521)  ketorolac (TORADOL) 30 MG/ML injection 30 mg (30 mg Intravenous Given 04/16/17 1539)     Initial Impression / Assessment and Plan / ED Course  I have reviewed the triage vital signs and the nursing notes.  Pertinent labs & imaging results that were available during my care of the patient were reviewed by me and considered in my medical decision making (see chart for details).     41 year old female with history of diabetes who presents with right flank pain. She is afebrile, mildly tachycardic, and hypertensive. States that she has been without her blood pressure and diabetes medications recently and has been taking a friend's.  Her urinalysis is concerning for pyelonephritis with too numerous to count WBCs, RBCs, leukocytes and nitrites. Also concern for possible kidney stone given the amount of blood in the urine. A CT renal stone protocol study was performed and visualized. There is evidence of mild hydronephrosis. Per radiology, there is possible 3 mm ureteral stone although there are multiple pelvic phleboliths. Blood work with mild leukocytosis, and normal renal function. Hyperglycemia noted but without evidence of DKA. Given IVF and dose of ceftriaxone. Tachycardia resolved after IVFs.  Discussed with Dr. Consuella Lose from urology. Clear if this is truly ureteral stone versus pelvic phleboliths. He recommended a  single doses of IV antibiotics with outpatient course of ciprofloxacin and close outpatient urology follow-up. Discussed with patient strict return instructions. She expressed understanding of all discharge instructions and felt comfortable with the plan of care.   Final Clinical Impressions(s) / ED Diagnoses   Final diagnoses:  Pyelonephritis  Ureteral stone    New Prescriptions New Prescriptions   AMLODIPINE (NORVASC) 5 MG TABLET    Take 2 tablets (10 mg total) by mouth daily.   CIPROFLOXACIN (CIPRO) 500 MG TABLET    Take 1 tablet (500 mg total) by mouth every 12 (twelve) hours.   HYDROCHLOROTHIAZIDE (HYDRODIURIL) 25 MG TABLET    Take 1 tablet (25 mg total) by mouth daily.   HYDROCODONE-ACETAMINOPHEN (NORCO/VICODIN) 5-325 MG TABLET    Take 1 tablet by mouth every 6 (six) hours as needed.   IBUPROFEN (ADVIL,MOTRIN) 600 MG TABLET    Take 1 tablet (600 mg total) by mouth every 6 (six) hours as needed.   METFORMIN (GLUCOPHAGE) 1000 MG TABLET    Take 1 tablet (1,000 mg total) by mouth 2 (two) times daily.   ONDANSETRON (ZOFRAN) 4 MG TABLET    Take 1 tablet (4 mg total) by mouth every 8 (eight) hours as needed for nausea or vomiting.     Forde Dandy, MD 04/16/17 1625    Forde Dandy, MD 04/16/17 1625    Forde Dandy, MD 04/16/17 [phone]

## 2017-04-18 LAB — URINE CULTURE: Culture: >=100000 — AB

## 2017-04-19 ENCOUNTER — Telehealth: Payer: Self-pay | Admitting: *Deleted

## 2017-04-19 NOTE — Telephone Encounter (Signed)
Post ED Visit - Positive Culture Follow-up  Culture report reviewed by antimicrobial stewardship pharmacist:  []  Elenor Quinones, Pharm.D. []  Heide Guile, Pharm.D., BCPS AQ-ID []  Parks Neptune, Pharm.D., BCPS []  Alycia Rossetti, Pharm.D., BCPS []  [place], Pharm.D., BCPS, AAHIVP []  Legrand Como, Pharm.D., BCPS, AAHIVP [x]  Salome Arnt, PharmD, BCPS []  Dimitri Ped, PharmD, BCPS []  Vincenza Hews, PharmD, BCPS  Positive urine culture Treated with Ciprofloxacin HCL, organism sensitive to the same and no further patient follow-up is required at this time.  Harlon Flor [facility] 04/19/2017, 11:16 AM

## 2017-05-26 ENCOUNTER — Ambulatory Visit: Payer: Self-pay | Admitting: Family Medicine

## 2017-07-15 ENCOUNTER — Ambulatory Visit: Payer: Self-pay | Admitting: Family Medicine

## 2017-12-26 ENCOUNTER — Emergency Department (HOSPITAL_COMMUNITY): Payer: 59

## 2017-12-26 ENCOUNTER — Emergency Department (HOSPITAL_COMMUNITY)
Admission: EM | Admit: 2017-12-26 | Discharge: 2017-12-26 | Disposition: A | Discharge status: Home / Self Care | Payer: 59 | Attending: Emergency Medicine | Admitting: Emergency Medicine

## 2017-12-26 DIAGNOSIS — Z7982 Long term (current) use of aspirin: Secondary | ICD-10-CM | POA: Insufficient documentation

## 2017-12-26 DIAGNOSIS — E119 Type 2 diabetes mellitus without complications: Secondary | ICD-10-CM | POA: Insufficient documentation

## 2017-12-26 DIAGNOSIS — Z7984 Long term (current) use of oral hypoglycemic drugs: Secondary | ICD-10-CM | POA: Diagnosis not present

## 2017-12-26 DIAGNOSIS — D250 Submucous leiomyoma of uterus: Secondary | ICD-10-CM

## 2017-12-26 DIAGNOSIS — N10 Acute tubulo-interstitial nephritis: Secondary | ICD-10-CM | POA: Insufficient documentation

## 2017-12-26 DIAGNOSIS — I10 Essential (primary) hypertension: Secondary | ICD-10-CM | POA: Diagnosis not present

## 2017-12-26 DIAGNOSIS — Z79899 Other long term (current) drug therapy: Secondary | ICD-10-CM | POA: Insufficient documentation

## 2017-12-26 DIAGNOSIS — R739 Hyperglycemia, unspecified: Secondary | ICD-10-CM

## 2017-12-26 DIAGNOSIS — R109 Unspecified abdominal pain: Secondary | ICD-10-CM | POA: Diagnosis present

## 2017-12-26 LAB — CBC
HCT: 43.2 % (ref 36.0–46.0)
HEMOGLOBIN: 14.5 g/dL (ref 12.0–15.0)
MCH: 28.3 pg (ref 26.0–34.0)
MCHC: 33.6 g/dL (ref 30.0–36.0)
MCV: 84.4 fL (ref 78.0–100.0)
Platelets: 288 10*3/uL (ref 150–400)
RBC: 5.12 MIL/uL — AB (ref 3.87–5.11)
RDW: 12.3 % (ref 11.5–15.5)
WBC: 11.8 10*3/uL — AB (ref 4.0–10.5)

## 2017-12-26 LAB — CBG MONITORING, ED: GLUCOSE-CAPILLARY: 257 mg/dL — AB (ref 65–99)

## 2017-12-26 LAB — URINALYSIS, ROUTINE W REFLEX MICROSCOPIC
BILIRUBIN URINE: NEGATIVE
Glucose, UA: >=500 mg/dL — AB
KETONES UR: 20 mg/dL — AB
NITRITE: NEGATIVE
SPECIFIC GRAVITY, URINE: 1.024 (ref 1.005–1.030)
pH: 5 (ref 5.0–8.0)

## 2017-12-26 LAB — COMPREHENSIVE METABOLIC PANEL
ALT: 97 U/L — ABNORMAL HIGH (ref 14–54)
ANION GAP: 14 (ref 5–15)
AST: 44 U/L — ABNORMAL HIGH (ref 15–41)
Albumin: 3.8 g/dL (ref 3.5–5.0)
Alkaline Phosphatase: 94 U/L (ref 38–126)
BILIRUBIN TOTAL: 0.8 mg/dL (ref 0.3–1.2)
BUN: 10 mg/dL (ref 6–20)
CHLORIDE: 97 mmol/L — AB (ref 101–111)
CO2: 21 mmol/L — ABNORMAL LOW (ref 22–32)
Calcium: 9.1 mg/dL (ref 8.9–10.3)
Creatinine, Ser: 0.74 mg/dL (ref 0.44–1.00)
GFR calc Af Amer: >60 mL/min (ref >60)
Glucose, Bld: 321 mg/dL — ABNORMAL HIGH (ref 65–99)
POTASSIUM: 3.6 mmol/L (ref 3.5–5.1)
Sodium: 132 mmol/L — ABNORMAL LOW (ref 135–145)
TOTAL PROTEIN: 8.9 g/dL — AB (ref 6.5–8.1)

## 2017-12-26 LAB — LIPASE, BLOOD: LIPASE: 22 U/L (ref 11–51)

## 2017-12-26 LAB — I-STAT BETA HCG BLOOD, ED (MC, WL, AP ONLY): I-stat hCG, quantitative: <5 m[IU]/mL (ref <5)

## 2017-12-26 LAB — WET PREP, GENITAL
Sperm: NONE SEEN
Trich, Wet Prep: NONE SEEN

## 2017-12-26 MED ORDER — CEPHALEXIN 500 MG PO CAPS: 500.0000 mg | ORAL_CAPSULE | Freq: Four times a day (QID) | ORAL | 0 refills | Status: DC

## 2017-12-26 MED ORDER — SODIUM CHLORIDE 0.9 % IV SOLN
1.0000 g | Freq: Once | INTRAVENOUS | Status: AC
  Administered 2017-12-26: 1 g via INTRAVENOUS
  Filled 2017-12-26: qty 10

## 2017-12-26 MED ORDER — SODIUM CHLORIDE 0.9 % IV BOLUS
1000.0000 mL | Freq: Once | INTRAVENOUS | Status: AC
  Administered 2017-12-26: 1000 mL via INTRAVENOUS

## 2017-12-26 MED ORDER — MORPHINE SULFATE (PF) 4 MG/ML IV SOLN
4.0000 mg | Freq: Once | INTRAVENOUS | Status: AC
  Administered 2017-12-26: 4 mg via INTRAVENOUS
  Filled 2017-12-26: qty 1

## 2017-12-26 MED ORDER — INSULIN ASPART 100 UNIT/ML ~~LOC~~ SOLN
6.0000 [IU] | Freq: Once | SUBCUTANEOUS | Status: AC
  Administered 2017-12-26: 6 [IU] via SUBCUTANEOUS
  Filled 2017-12-26: qty 1

## 2017-12-26 MED ORDER — SODIUM CHLORIDE 0.9 % IV SOLN
500.0000 mg | Freq: Once | INTRAVENOUS | Status: AC
  Administered 2017-12-26: 500 mg via INTRAVENOUS
  Filled 2017-12-26: qty 500

## 2017-12-26 MED ORDER — METRONIDAZOLE IN NACL 5-0.79 MG/ML-% IV SOLN
500.0000 mg | Freq: Once | INTRAVENOUS | Status: AC
  Administered 2017-12-26: 500 mg via INTRAVENOUS
  Filled 2017-12-26: qty 100

## 2017-12-26 MED ORDER — ONDANSETRON HCL 4 MG/2ML IJ SOLN
4.0000 mg | Freq: Once | INTRAMUSCULAR | Status: AC
  Administered 2017-12-26: 4 mg via INTRAVENOUS
  Filled 2017-12-26: qty 2

## 2017-12-26 NOTE — ED Notes (Signed)
Bed: GO11 Expected date:  Expected time:  Means of arrival:  Comments: 42 yo flank pain

## 2017-12-26 NOTE — ED Provider Notes (Signed)
[facility] DEPT Provider Note   CSN: [id number] Arrival date & time: 12/26/17  1134     History   Chief Complaint Chief Complaint  Patient presents with  . Flank Pain    HPI Tiffany Le is a 42 y.o. female.  HPI Patient presents with acute onset left flank pain that started yesterday evening.  She then developed right flank pain.  She is had several days of urinary frequency and urgency but denies dysuria.  No gross hematuria.  She is had subjective fevers and chills.  Endorses nausea but no vomiting.  Also states she is been constipated.  Denies vaginal symptoms. Past Medical History:  Diagnosis Date  . Diabetes mellitus without complication ([place])   . Hyperlipidemia age 61  . Hypertension age 10  . Iron deficiency anemia   . Obesity     Patient Active Problem List   Diagnosis Date Noted  . Elevated LFTs 01/13/2014  . Unspecified vitamin D deficiency 01/13/2014  . Type II or unspecified type diabetes mellitus without mention of complication, uncontrolled 11/03/2013  . Anemia, iron deficiency 11/17/2012  . Essential hypertension, benign 11/17/2012  . Pure hypercholesterolemia 11/17/2012  . Morbid obesity with BMI of 45.0-49.9, adult ([place]) 11/17/2012    Past Surgical History:  Procedure Laterality Date  . CESAREAN SECTION  3/96  . ORIF FEMUR FRACTURE       OB History    Gravida  1   Para  1   Term      Preterm      AB      Living  1     SAB      TAB      Ectopic      Multiple      Live Births               Home Medications    Prior to Admission medications   Medication Sig Start Date End Date Taking? Authorizing Provider  amLODipine (NORVASC) 5 MG tablet Take 2 tablets (10 mg total) by mouth daily. Patient taking differently: Take 5 mg by mouth daily.  04/16/17  Yes Forde Dandy, MD  aspirin EC 81 MG tablet Take 81 mg by mouth daily.   Yes [provider]  hydrochlorothiazide (HYDRODIURIL) 25  MG tablet Take 1 tablet (25 mg total) by mouth daily. 04/16/17  Yes Forde Dandy, MD  albuterol (PROVENTIL HFA) 108 (90 BASE) MCG/ACT inhaler Inhale 2 puffs into the lungs every 6 (six) hours as needed for wheezing. Patient not taking: Reported on 12/26/2017 08/20/12   Tysinger, Camelia Eng, PA-C  amLODipine (NORVASC) 10 MG tablet TAKE 1 TABLET (10 MG TOTAL) BY MOUTH DAILY. Patient not taking: Reported on 12/26/2017 01/13/16   [place] Lions, PA-C  cephALEXin (KEFLEX) 500 MG capsule Take 1 capsule (500 mg total) by mouth 4 (four) times daily. 12/26/17   Lajean Saver, MD  colesevelam [facility]) 625 MG tablet Take 3 tablets twice daily Patient not taking: Reported on 04/16/2017 01/12/14   Rita Ohara, MD  cyclobenzaprine (FLEXERIL) 10 MG tablet Take 1 tablet (10 mg total) by mouth 2 (two) times daily as needed for muscle spasms. Patient not taking: Reported on 12/26/2017 01/13/16   [place] Lions, PA-C  ergocalciferol (VITAMIN D2) 50000 UNITS capsule Take 1 capsule (50,000 Units total) by mouth once a week. Patient not taking: Reported on 04/16/2017 01/16/14   Rita Ohara, MD  hydrochlorothiazide (HYDRODIURIL) 25 MG tablet Take 1 tablet (25  mg total) by mouth daily. Patient not taking: Reported on 12/26/2017 01/13/16   [place] Lions, PA-C  HYDROcodone-acetaminophen (NORCO/VICODIN) 5-325 MG tablet Take 1 tablet by mouth every 6 (six) hours as needed. Patient not taking: Reported on 12/26/2017 04/16/17   Forde Dandy, MD  ibuprofen (ADVIL,MOTRIN) 600 MG tablet Take 1 tablet (600 mg total) by mouth every 6 (six) hours as needed. Patient not taking: Reported on 12/26/2017 04/16/17   Forde Dandy, MD  metFORMIN (GLUCOPHAGE) 1000 MG tablet Take 1 tablet (1,000 mg total) by mouth 2 (two) times daily. Patient not taking: Reported on 12/26/2017 04/16/17   Forde Dandy, MD  metFORMIN (GLUCOPHAGE) 500 MG tablet 1000mg  prior to breakfast, 500mg  prior to dinner Patient not taking: Reported on 12/26/2017 01/12/14   Rita Ohara, MD  naproxen (NAPROSYN) 500 MG tablet Take 1 tablet (500 mg total) by mouth 2 (two) times daily. Patient not taking: Reported on 04/16/2017 01/13/16   [place] Lions, PA-C  ondansetron (ZOFRAN) 4 MG tablet Take 1 tablet (4 mg total) by mouth every 8 (eight) hours as needed for nausea or vomiting. Patient not taking: Reported on 12/26/2017 04/16/17   Forde Dandy, MD    Family History Family History  Problem Relation Age of Onset  . Heart disease Mother        CABG <50  . Hypertension Mother   . Diabetes Mother   . Hyperlipidemia Mother   . Heart disease Father   . Hyperlipidemia Father   . Hypertension Father   . Early death Father   . Healthy Daughter   . Diabetes Maternal Aunt   . Cancer Maternal Grandmother        stomach cancer  . Diabetes Maternal Aunt   . Cancer Cousin        colon cancer (dx'd 37)    Social History Social History   Tobacco Use  . Smoking status: Never Smoker  . Smokeless tobacco: Never Used  Substance Use Topics  . Alcohol use: No  . Drug use: No     Allergies   Penicillins   Review of Systems Review of Systems  Constitutional: Positive for chills and fever.  Respiratory: Negative for cough and shortness of breath.   Cardiovascular: Negative for chest pain.  Gastrointestinal: Positive for constipation and nausea. Negative for abdominal pain, diarrhea and vomiting.  Genitourinary: Positive for flank pain and frequency. Negative for dysuria, hematuria, pelvic pain, vaginal bleeding and vaginal discharge.  Musculoskeletal: Positive for back pain and myalgias. Negative for neck pain and neck stiffness.  Skin: Negative for rash and wound.  Neurological: Negative for dizziness, weakness, light-headedness, numbness and headaches.  All other systems reviewed and are negative.    Physical Exam Updated Vital Signs BP (!) 165/85   Pulse (!) 114   Temp 98.6 F (37 C)   Resp (!) 22   SpO2 100%   Physical Exam  Constitutional: She  is oriented to person, place, and time. She appears well-developed and well-nourished. No distress.  HENT:  Head: Normocephalic and atraumatic.  Mouth/Throat: Oropharynx is clear and moist. No oropharyngeal exudate.  Eyes: Pupils are equal, round, and reactive to light. EOM are normal.  Neck: Normal range of motion. Neck supple.  Cardiovascular: Normal rate and regular rhythm. Exam reveals no gallop and no friction rub.  No murmur heard. Pulmonary/Chest: Effort normal and breath sounds normal. No stridor. No respiratory distress. She has no wheezes. She has no rales. She exhibits no  tenderness.  Abdominal: Soft. Bowel sounds are normal. There is no tenderness. There is no rebound and no guarding.  Genitourinary:  Genitourinary Comments: Copious amount of purulent vaginal discharge.  Patient very uncomfortable and unable to tolerate bimanual exam.  Musculoskeletal: Normal range of motion. She exhibits tenderness. She exhibits no edema.  Patient with diffuse midline lumbar tenderness to palpation.  Right CVA tenderness.  Lymphadenopathy:    She has no cervical adenopathy.  Neurological: She is alert and oriented to person, place, and time.  Moves all extremities without deficit.  Sensation fully intact.  Skin: Skin is warm and dry. Capillary refill takes less than 2 seconds. No rash noted. She is not diaphoretic. No erythema.  Psychiatric: She has a normal mood and affect. Her behavior is normal.  Nursing note and vitals reviewed.    ED Treatments / Results  Labs (all labs ordered are listed, but only abnormal results are displayed) Labs Reviewed  WET PREP, GENITAL - Abnormal; Notable for the following components:      Result Value   Yeast Wet Prep HPF POC PRESENT (*)    Clue Cells Wet Prep HPF POC PRESENT (*)    WBC, Wet Prep HPF POC MANY (*)    All other components within normal limits  COMPREHENSIVE METABOLIC PANEL - Abnormal; Notable for the following components:   Sodium 132 (*)     Chloride 97 (*)    CO2 21 (*)    Glucose, Bld 321 (*)    Total Protein 8.9 (*)    AST 44 (*)    ALT 97 (*)    All other components within normal limits  CBC - Abnormal; Notable for the following components:   WBC 11.8 (*)    RBC 5.12 (*)    All other components within normal limits  URINALYSIS, ROUTINE W REFLEX MICROSCOPIC - Abnormal; Notable for the following components:   APPearance CLOUDY (*)    Glucose, UA >=500 (*)    Hgb urine dipstick LARGE (*)    Ketones, ur 20 (*)    Protein, ur >=300 (*)    Leukocytes, UA LARGE (*)    RBC / HPF >50 (*)    WBC, UA >50 (*)    Bacteria, UA MANY (*)    All other components within normal limits  CBG MONITORING, ED - Abnormal; Notable for the following components:   Glucose-Capillary 257 (*)    All other components within normal limits  URINE CULTURE  LIPASE, BLOOD  I-STAT BETA HCG BLOOD, ED ([facility], [facility], [facility] ONLY)  GC/CHLAMYDIA PROBE AMP ([place]) NOT AT [facility]    EKG None  Radiology US Pelvis Complete  Result Date: 12/26/2017 CLINICAL DATA:  Right-sided pelvic and flank pain for 2-3 days. EXAM: TRANSABDOMINAL ULTRASOUND OF PELVIS TECHNIQUE: Transabdominal ultrasound examination of the pelvis was performed including evaluation of the uterus, ovaries, adnexal regions, and pelvic cul-de-sac. COMPARISON:  None. FINDINGS: Uterus Measurements: 9.7 x 5.1 x 5.3 cm. Subserosal fibroid arising off the posterior right side of the uterine body measuring 4.2 x 3.4 x 3.3 cm. Endometrium Thickness: 1 cm.  No focal abnormality visualized. Right ovary Unable to visualize.  No adnexal mass noted. Left ovary Measurements: 3.7 x 2.2 x 2.7 cm. Normal appearance/no adnexal mass. Other findings:  Trace fluid in the pelvis. IMPRESSION: 1. Subserosal posterior and right-sided uterine fibroid measuring 4.2 x 3.4 x 3.3 cm. 2. Nonvisualized right ovary however no right-sided adnexal mass is identified. Electronically Signed   By: Shanon Brow  Randel Pigg M.D.   On: 12/26/2017  17:54   Ct Renal Stone Study  Result Date: 12/26/2017 CLINICAL DATA:  42 year old female with a history of right flank pain EXAM: CT ABDOMEN AND PELVIS WITHOUT CONTRAST TECHNIQUE: Multidetector CT imaging of the abdomen and pelvis was performed following the standard protocol without IV contrast. COMPARISON:  04/16/2017 FINDINGS: Lower chest: No acute abnormality. Hepatobiliary: Unremarkable appearance of hepatic parenchyma. Unremarkable gallbladder Pancreas: Unremarkable pancreas Spleen: Unremarkable spleen Adrenals/Urinary Tract: Unremarkable adrenal glands. Unchanged configuration of the right kidney, with mild dilation of the collecting system. No stones along the length of the proximal and mid right ureter. As was discussed on the prior, the distal right ureter is not well visualized. There is a similar configuration of pelvic phleboliths on the right compared to the prior, including unchanged position of the small calcification near the ureterovesical junction (image 73 of series 2). Unchanged appearance of the left kidney with no hydronephrosis or nephrolithiasis. Unremarkable course of the left ureter. Stomach/Bowel: Unremarkable stomach, small bowel, colon. Normal appendix. No evidence of bowel obstruction. 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 Vascular/Lymphatic: No significant atherosclerotic changes. No adenopathy Reproductive: Fluid within the endometrial canal. Soft tissue density mass/nodule on the right aspect of the uterus measures 4.9 cm, unchanged from the comparison CT study. Other: No abdominal hernia. Musculoskeletal: Surgical changes of proximal right femur. No acute displaced fracture. Degenerative changes of the spine. No bony canal narrowing. IMPRESSION: The configuration of the right kidney collecting system and the right ureter are unchanged compared to the prior CT of 04/16/2017. Mild dilation persists, as well as a questionable calcification near the  right ureterovesical junction. Again, this may represent a pelvic phlebolith or a retained distal ureteral stone. The most sensitive test for detecting ureteral stone would be retrograde pyelogram, if needed. Correlation with urinalysis may be useful if there is concern for infection. No evidence of left-sided hydronephrosis or nephrolithiasis. Unchanged soft tissue associated with the right uterus, potentially exophytic fibroid or a right adnexal mass. Follow-up with Ob GYN evaluation is recommended. Electronically Signed   By: Corrie Mckusick D.O.   On: 12/26/2017 13:45    Procedures Procedures (including critical care time)  Medications Ordered in ED Medications  morphine 4 MG/ML injection 4 mg (4 mg Intravenous Given 12/26/17 1221)  ondansetron (ZOFRAN) injection 4 mg (4 mg Intravenous Given 12/26/17 1221)  sodium chloride 0.9 % bolus 1,000 mL (0 mLs Intravenous Stopped 12/26/17 1602)  cefTRIAXone (ROCEPHIN) 1 g in sodium chloride 0.9 % 100 mL IVPB (0 g Intravenous Stopped 12/26/17 1456)  morphine 4 MG/ML injection 4 mg (4 mg Intravenous Given 12/26/17 1447)  sodium chloride 0.9 % bolus 1,000 mL (0 mLs Intravenous Stopped 12/26/17 1602)  metroNIDAZOLE (FLAGYL) IVPB 500 mg (0 mg Intravenous Stopped 12/26/17 1700)  azithromycin (ZITHROMAX) 500 mg in sodium chloride 0.9 % 250 mL IVPB (0 mg Intravenous Stopped 12/26/17 1820)  sodium chloride 0.9 % bolus 1,000 mL (0 mLs Intravenous Stopped 12/26/17 1941)  insulin aspart (novoLOG) injection 6 Units (6 Units Subcutaneous Given 12/26/17 1916)     Initial Impression / Assessment and Plan / ED Course  I have reviewed the triage vital signs and the nursing notes.  Pertinent labs & imaging results that were available during my care of the patient were reviewed by me and considered in my medical decision making (see chart for details).    Discussed CT findings with urology resident on-call who reviewed CT and compared to previous CT and  2018.  Low suspicion  that calcifications in the pelvis represent obstructive renal stone given there unchanged from prior imaging.  Will get ultrasound of the pelvis to better evaluate right uterine/adnexal mass.  Question TOA.  Signed out to oncoming emergency provider pending pelvic ultrasound.  Final Clinical Impressions(s) / ED Diagnoses   Final diagnoses:  Acute pyelonephritis  Submucous leiomyoma of uterus  Hyperglycemia    ED Discharge Orders        Ordered    cephALEXin (KEFLEX) 500 MG capsule  4 times daily     12/26/17 2040       Julianne Rice, MD 12/27/17 [phone]

## 2017-12-26 NOTE — ED Notes (Signed)
No respiratory or acute distress noted alert and oriented x 3 call light in reach no reaction to medication noted. 

## 2017-12-26 NOTE — ED Provider Notes (Addendum)
Signed out to d/c to home if U/S neg for abscess.   U/s shows uterine fibroid.   On recheck pain notes pain resolved, and is asking for po fluids.   abd is soft nt. Po fluids provided. No nv.   Iv abx given.  Patient currently appears stable.   Given recurrent uti/pyelo, ?abn on ct - will have f/u urology.   Glucose high, 321. Iv ns bolus. novolog 6 units sq.   Pt states had been out of metformin/not taking, but just got it refilled, but hasnt taken today.  Pt states recently got insurance, so has made appt with Dr Clayburn Pert, and will f/u with urology.       Lajean Saver, MD 12/26/17 1900

## 2017-12-26 NOTE — Discharge Instructions (Addendum)
It was our pleasure to provide your ER care today - we hope that you feel better.  Take antibiotic as prescribed.    Take motrin or aleve as need for pain.   Your imaging studies were read as showing:  The configuration of the right kidney collecting system and the right ureter are unchanged compared to the prior CT of 04/16/2017. Mild dilation persists, as well as a questionable calcification near the right ureterovesical junction.  Also noted was a right sided uterine fibroid.   For recurrent urine/kidney infection, and above CT results - follow up with urologist - call office Monday to arrange follow up appointment.  For uterine fibroid, follow up with ob/gyn doctor in the next few weeks.   From the lab tests, a couple of your liver tests are mildly elevated (AST 44, ALT 97) - hold/stop your cholesterol medication for now, and discuss with primary care doctor.  Your blood sugar is also high (300s) - continue metformin, drink adequate water/fluids, follow diabetic meal plan, check sugars 4x/day and record values, and follow up with primary care doctor in the next couple days for recheck.   Return to ER right away if worse, new symptoms, high fevers, persistent vomiting, worsening pain, feeling weak/faint, other concern.

## 2017-12-26 NOTE — ED Triage Notes (Signed)
Transported by [facility] from home--right flank pain that started a few days ago (initially started in the left flank) Hx of kidney infection. CBG read 291 mg/dl with EMS, hx of DM. +urinary frequency.

## 2017-12-28 LAB — GC/CHLAMYDIA PROBE AMP (~~LOC~~) NOT AT ARMC
Chlamydia: NEGATIVE
Neisseria Gonorrhea: NEGATIVE

## 2017-12-28 LAB — URINE CULTURE: Culture: >=100000 — AB

## 2017-12-29 ENCOUNTER — Telehealth: Payer: Self-pay | Admitting: Emergency Medicine

## 2017-12-29 NOTE — Telephone Encounter (Signed)
Post ED Visit - Positive Culture Follow-up  Culture report reviewed by antimicrobial stewardship pharmacist:  []  Elenor Quinones, Pharm.D. []  Heide Guile, Pharm.D., BCPS AQ-ID [x]  Parks Neptune, Pharm.D., BCPS []  Alycia Rossetti, Pharm.D., BCPS []  [place], Pharm.D., BCPS, AAHIVP []  Legrand Como, Pharm.D., BCPS, AAHIVP []  Salome Arnt, PharmD, BCPS []  Wynell Balloon, PharmD []  Vincenza Hews, PharmD, BCPS  Positive urine culture Treated with cephalexin, organism sensitive to the same and no further patient follow-up is required at this time.  Hazle Nordmann 12/29/2017, 9:46 AM

## 2018-04-06 ENCOUNTER — Other Ambulatory Visit: Payer: Self-pay | Admitting: Physician Assistant

## 2018-04-06 DIAGNOSIS — Z1231 Encounter for screening mammogram for malignant neoplasm of breast: Secondary | ICD-10-CM

## 2018-04-27 ENCOUNTER — Other Ambulatory Visit: Payer: Self-pay | Admitting: Physician Assistant

## 2018-04-27 DIAGNOSIS — N6452 Nipple discharge: Secondary | ICD-10-CM

## 2018-04-30 ENCOUNTER — Ambulatory Visit
Admission: RE | Admit: 2018-04-30 | Discharge: 2018-04-30 | Disposition: A | Discharge status: Home / Self Care | Payer: 59 | Source: Ambulatory Visit | Attending: Physician Assistant | Admitting: Physician Assistant

## 2018-04-30 ENCOUNTER — Other Ambulatory Visit: Payer: Self-pay | Admitting: Physician Assistant

## 2018-04-30 DIAGNOSIS — N6452 Nipple discharge: Secondary | ICD-10-CM

## 2018-04-30 DIAGNOSIS — N6320 Unspecified lump in the left breast, unspecified quadrant: Secondary | ICD-10-CM

## 2018-04-30 DIAGNOSIS — N6310 Unspecified lump in the right breast, unspecified quadrant: Secondary | ICD-10-CM

## 2018-04-30 DIAGNOSIS — R599 Enlarged lymph nodes, unspecified: Secondary | ICD-10-CM

## 2018-05-05 ENCOUNTER — Other Ambulatory Visit: Payer: 59

## 2018-05-12 ENCOUNTER — Other Ambulatory Visit: Payer: 59

## 2018-05-12 ENCOUNTER — Inpatient Hospital Stay: Admission: RE | Admit: 2018-05-12 | Payer: 59 | Source: Ambulatory Visit

## 2018-10-19 ENCOUNTER — Encounter: Payer: Self-pay | Admitting: Family Medicine

## 2018-10-19 ENCOUNTER — Ambulatory Visit (INDEPENDENT_AMBULATORY_CARE_PROVIDER_SITE_OTHER): Payer: Self-pay | Admitting: Family Medicine

## 2018-10-19 VITALS — BP 176/92 | HR 96 | Temp 98.2°F | Ht 64.0 in | Wt 236.0 lb

## 2018-10-19 DIAGNOSIS — Z131 Encounter for screening for diabetes mellitus: Secondary | ICD-10-CM

## 2018-10-19 DIAGNOSIS — M549 Dorsalgia, unspecified: Secondary | ICD-10-CM

## 2018-10-19 DIAGNOSIS — Z09 Encounter for follow-up examination after completed treatment for conditions other than malignant neoplasm: Secondary | ICD-10-CM

## 2018-10-19 DIAGNOSIS — Z0000 Encounter for general adult medical examination without abnormal findings: Secondary | ICD-10-CM

## 2018-10-19 DIAGNOSIS — R319 Hematuria, unspecified: Secondary | ICD-10-CM

## 2018-10-19 DIAGNOSIS — Z6841 Body Mass Index (BMI) 40.0 and over, adult: Secondary | ICD-10-CM

## 2018-10-19 DIAGNOSIS — N390 Urinary tract infection, site not specified: Secondary | ICD-10-CM

## 2018-10-19 DIAGNOSIS — I10 Essential (primary) hypertension: Secondary | ICD-10-CM

## 2018-10-19 DIAGNOSIS — R0602 Shortness of breath: Secondary | ICD-10-CM

## 2018-10-19 DIAGNOSIS — R8290 Unspecified abnormal findings in urine: Secondary | ICD-10-CM

## 2018-10-19 DIAGNOSIS — E559 Vitamin D deficiency, unspecified: Secondary | ICD-10-CM

## 2018-10-19 DIAGNOSIS — E66813 Obesity, class 3: Secondary | ICD-10-CM

## 2018-10-19 DIAGNOSIS — Z7689 Persons encountering health services in other specified circumstances: Secondary | ICD-10-CM

## 2018-10-19 LAB — POCT URINALYSIS DIP (MANUAL ENTRY)
Bilirubin, UA: NEGATIVE
Glucose, UA: 250 mg/dL — AB
Ketones, POC UA: NEGATIVE mg/dL
Nitrite, UA: NEGATIVE
Protein Ur, POC: 30 mg/dL — AB
Spec Grav, UA: 1.01 (ref 1.010–1.025)
Urobilinogen, UA: 0.2 E.U./dL
pH, UA: 5 (ref 5.0–8.0)

## 2018-10-19 LAB — POCT GLYCOSYLATED HEMOGLOBIN (HGB A1C): Hemoglobin A1C: 11.7 % — AB (ref 4.0–5.6)

## 2018-10-19 MED ORDER — ERGOCALCIFEROL 1.25 MG (50000 UT) PO CAPS: 50000.0000 [IU] | ORAL_CAPSULE | ORAL | 3 refills | Status: DC

## 2018-10-19 MED ORDER — HYDROCHLOROTHIAZIDE 25 MG PO TABS: 25.0000 mg | ORAL_TABLET | Freq: Every day | ORAL | 3 refills | Status: DC

## 2018-10-19 MED ORDER — ALBUTEROL SULFATE HFA 108 (90 BASE) MCG/ACT IN AERS: 2.0000 | INHALATION_SPRAY | Freq: Four times a day (QID) | RESPIRATORY_TRACT | 6 refills | Status: DC | PRN

## 2018-10-19 MED ORDER — AMLODIPINE BESYLATE 10 MG PO TABS: 10.0000 mg | ORAL_TABLET | Freq: Every day | ORAL | 1 refills | Status: DC

## 2018-10-19 MED ORDER — GLIPIZIDE 10 MG PO TABS: 10.0000 mg | ORAL_TABLET | Freq: Two times a day (BID) | ORAL | 3 refills | Status: DC

## 2018-10-19 MED ORDER — SULFAMETHOXAZOLE-TRIMETHOPRIM 800-160 MG PO TABS: 1.0000 | ORAL_TABLET | Freq: Two times a day (BID) | ORAL | 0 refills | Status: DC

## 2018-10-19 MED ORDER — METFORMIN HCL 1000 MG PO TABS: 1000.0000 mg | ORAL_TABLET | Freq: Two times a day (BID) | ORAL | 3 refills | Status: DC

## 2018-10-19 MED ORDER — CYCLOBENZAPRINE HCL 10 MG PO TABS: 10.0000 mg | ORAL_TABLET | Freq: Two times a day (BID) | ORAL | 3 refills | Status: DC | PRN

## 2018-10-19 NOTE — Patient Instructions (Addendum)
Urinary Tract Infection, Adult A urinary tract infection (UTI) is an infection of any part of the urinary tract. The urinary tract includes:  The kidneys.  The ureters.  The bladder.  The urethra. These organs make, store, and get rid of pee (urine) in the body. What are the causes? This is caused by germs (bacteria) in your genital area. These germs grow and cause swelling (inflammation) of your urinary tract. What increases the risk? You are more likely to develop this condition if:  You have a small, thin tube (catheter) to drain pee.  You cannot control when you pee or poop (incontinence).  You are female, and: ? You use these methods to prevent pregnancy: ? A medicine that kills sperm (spermicide). ? A device that blocks sperm (diaphragm). ? You have low levels of a female hormone (estrogen). ? You are pregnant.  You have genes that add to your risk.  You are sexually active.  You take antibiotic medicines.  You have trouble peeing because of: ? A prostate that is bigger than normal, if you are female. ? A blockage in the part of your body that drains pee from the bladder (urethra). ? A kidney stone. ? A nerve condition that affects your bladder (neurogenic bladder). ? Not getting enough to drink. ? Not peeing often enough.  You have other conditions, such as: ? Diabetes. ? A weak disease-fighting system (immune system). ? Sickle cell disease. ? Gout. ? Injury of the spine. What are the signs or symptoms? Symptoms of this condition include:  Needing to pee right away (urgently).  Peeing often.  Peeing small amounts often.  Pain or burning when peeing.  Blood in the pee.  Pee that smells bad or not like normal.  Trouble peeing.  Pee that is cloudy.  Fluid coming from the vagina, if you are female.  Pain in the belly or lower back. Other symptoms include:  Throwing up (vomiting).  No urge to eat.  Feeling mixed up (confused).  Being tired  and grouchy (irritable).  A fever.  Watery poop (diarrhea). How is this treated? This condition may be treated with:  Antibiotic medicine.  Other medicines.  Drinking enough water. Follow these instructions at home:  Medicines  Take over-the-counter and prescription medicines only as told by your doctor.  If you were prescribed an antibiotic medicine, take it as told by your doctor. Do not stop taking it even if you start to feel better. General instructions  Make sure you: ? Pee until your bladder is empty. ? Do not hold pee for a long time. ? Empty your bladder after sex. ? Wipe from front to back after pooping if you are a female. Use each tissue one time when you wipe.  Drink enough fluid to keep your pee pale yellow.  Keep all follow-up visits as told by your doctor. This is important. Contact a doctor if:  You do not get better after 1-2 days.  Your symptoms go away and then come back. Get help right away if:  You have very bad back pain.  You have very bad pain in your lower belly.  You have a fever.  You are sick to your stomach (nauseous).  You are throwing up. Summary  A urinary tract infection (UTI) is an infection of any part of the urinary tract.  This condition is caused by germs in your genital area.  There are many risk factors for a UTI. These include having a small, thin   tube to drain pee and not being able to control when you pee or poop.  Treatment includes antibiotic medicines for germs.  Drink enough fluid to keep your pee pale yellow. This information is not intended to replace advice given to you by your health care provider. Make sure you discuss any questions you have with your health care provider. Document Released: 01/14/2008 Document Revised: 02/04/2018 Document Reviewed: 02/04/2018 Elsevier Interactive Patient Education  2019 [place].    Sulfamethoxazole; Trimethoprim, SMX-TMP oral suspension What is this  medicine? SULFAMETHOXAZOLE; TRIMETHOPRIM or SMX-TMP (suhl fuh meth OK suh zohl; trye METH oh prim) is a combination of a sulfonamide antibiotic and a second antibiotic, trimethoprim. It is used to treat or prevent certain kinds of bacterial infections.It will not work for colds, flu, or other viral infections. This medicine may be used for other purposes; ask your health care provider or pharmacist if you have questions. COMMON BRAND NAME(S): Septra, Sulfatrim, Sulfatrim Pediatric, Sultrex Pediatric What should I tell my health care provider before I take this medicine? They need to know if you have any of these conditions: -anemia -asthma -being treated with anticonvulsants -if you frequently drink alcohol containing drinks -kidney disease -liver disease -low level of folic acid or [id number] dehydrogenase -poor nutrition or malabsorption -porphyria -severe allergies -thyroid disorder -an unusual or allergic reaction to sulfamethoxazole, trimethoprim, sulfa drugs, other medicines, foods, dyes, or preservatives -pregnant or trying to get pregnant -breast-feeding How should I use this medicine? Take this suspension by mouth. Follow the directions on the prescription label. Shake the bottle well before taking. Use a specially marked spoon or container to measure your medicine. Ask your pharmacist if you do not have one. Household spoons are not accurate. Take your doses at regular intervals. Do not take more medicine than directed. Talk to your pediatrician regarding the use of this medicine in children. Special care may be needed. While this drug may be prescribed for children as young as 53 months of age for selected conditions, precautions do apply. Overdosage: If you think you have taken too much of this medicine contact a poison control center or emergency room at once. NOTE: This medicine is only for you. Do not share this medicine with others. What if I miss a dose? If you miss  a dose, take it as soon as you can. If it is almost time for your next dose, take only that dose. Do not take double or extra doses. What may interact with this medicine? Do not take this medicine with any of the following medications -aminobenzoate potassium -dofetilide -metronidazole This medicine may also interact with the following medications -ACE inhibitors like benazepril, enalapril, lisinopril, and ramipril -birth control pills -cyclosporine -digoxin -diuretics -indomethacin -medicines for diabetes -methenamine -methotrexate -phenytoin -potassium supplements -pyrimethamine -sulfinpyrazone -tricyclic antidepressants -warfarin This list may not describe all possible interactions. Give your health care provider a list of all the medicines, herbs, non-prescription drugs, or dietary supplements you use. Also tell them if you smoke, drink alcohol, or use illegal drugs. Some items may interact with your medicine. What should I watch for while using this medicine? Tell your doctor or health care professional if your symptoms do not improve. Drink several glasses of water a day to reduce the risk of kidney problems. Do not treat diarrhea with over the counter products. Contact your doctor if you have diarrhea that lasts more than 2 days or if it is severe and watery. This medicine can make you more sensitive to  the sun. Keep out of the sun. If you cannot avoid being in the sun, wear protective clothing and use a sunscreen. Do not use sun lamps or tanning beds/booths. What side effects may I notice from receiving this medicine? Side effects that you should report to your doctor or health care professional as soon as possible: -allergic reactions like skin rash or hives, swelling of the face, lips, or tongue -breathing problems -fever or chills, sore throat -irregular heartbeat, chest pain -joint or muscle pain -pain or difficulty passing urine -red pinpoint spots on skin -redness,  blistering, peeling or loosening of the skin, including inside the mouth -unusual bleeding or bruising -unusual weakness or tiredness -yellowing of the eyes or skin Side effects that usually do not require medical attention (report to your doctor or health care professional if they continue or are bothersome): -diarrhea -dizziness -headache -loss of appetite -nausea, vomiting -nervousness This list may not describe all possible side effects. Call your doctor for medical advice about side effects. You may report side effects to FDA at 1-800-FDA-1088. Where should I keep my medicine? Keep out of the reach of children. Store at room temperature between 15 and 25 degrees C (59 and 77 degrees F). Protect from light and moisture. Throw away any unused medicine after the expiration date. NOTE: This sheet is a summary. It may not cover all possible information. If you have questions about this medicine, talk to your doctor, pharmacist, or health care provider.  2019 Elsevier/[employer] Standard (2013-03-04 14:37:40)

## 2018-10-19 NOTE — Progress Notes (Signed)
[facility]  New Patient--Hospital Follow Up--Establish Care  Subjective:  Patient ID: Tiffany Le, female    DOB: 03-Jun-1976  Age: 43 y.o. MRN: [id number]  CC:  Chief Complaint  Patient presents with  . Establish Care    HPI Tiffany Le is a 43 year old female who presents for Hospital Follow Up and to Establish Care today.   Past Medical History:  Diagnosis Date  . Diabetes mellitus without complication ([place])   . Hyperlipidemia age 57  . Hypertension age 27  . Iron deficiency anemia   . Obesity    Current Status: Since her last office visit, she is doing well with no complaints. She states that she has not taken medications X over 1 year ago. She has noticed yeast infections lately. She denies fatigue, frequent urination, excessive hunger, excessive thirst, weight gain, weight loss, and poor wound healing.Her energy level is low. She has not taken preprandial blood glucose levels lately. She has been having urinary frequency and increased thirst. She states that her appetite has decreased.  She has noticed visual changes lately. She denies chest pain, cough, shortness of breath, heart palpitations, and falls. She has occasional headaches and dizziness with position changes. Denies severe headaches, confusion, seizures, nausea and vomiting.  She denies fevers, chills, recent infections, weight loss, and night sweats. No reports of GI problems such as diarrhea, and constipation. She has no reports of blood in stools, dysuria and hematuria. No depression or anxiety reported. She denies pain today.   Past Surgical History:  Procedure Laterality Date  . CESAREAN SECTION  3/96  . ORIF FEMUR FRACTURE      Family History  Problem Relation Age of Onset  . Heart disease Mother        CABG <50  . Hypertension Mother   . Diabetes Mother   . Hyperlipidemia Mother   . Heart disease Father   . Hyperlipidemia Father   .  Hypertension Father   . Early death Father   . Healthy Daughter   . Diabetes Maternal Aunt   . Cancer Maternal Grandmother        stomach cancer  . Diabetes Maternal Aunt   . Cancer Cousin        colon cancer (dx'd 46)    Social History   Socioeconomic History  . Marital status: Single    Spouse name: Not on file  . Number of children: Not on file  . Years of education: Not on file  . Highest education level: Not on file  Occupational History  . Not on file  Social Needs  . Financial resource strain: Not on file  . Food insecurity:    Worry: Not on file    Inability: Not on file  . Transportation needs:    Medical: Not on file    Non-medical: Not on file  Tobacco Use  . Smoking status: Never Smoker  . Smokeless tobacco: Never Used  Substance and Sexual Activity  . Alcohol use: No  . Drug use: No  . Sexual activity: Not Currently    Partners: Male    Comment: husband currently in [place], returning 02/2013  Lifestyle  . Physical activity:    Days per week: Not on file    Minutes per session: Not on file  . Stress: Not on file  Relationships  . Social connections:    Talks on phone: Not on file    Gets together: Not on  file    Attends religious service: Not on file    Active member of club or organization: Not on file    Attends meetings of clubs or organizations: Not on file    Relationship status: Not on file  . Intimate partner violence:    Fear of current or ex partner: Not on file    Emotionally abused: Not on file    Physically abused: Not on file    Forced sexual activity: Not on file  Other Topics Concern  . 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  Lives with 84 year old daughter, 1 cat Husband is living in [place] (citizen there), trying to come to [place] (has been delayed)    Outpatient Medications Prior to Visit  Medication Sig Dispense Refill  . aspirin EC 81 MG tablet Take 81 mg  by mouth daily.    Marland Kitchen albuterol (PROVENTIL HFA) 108 (90 BASE) MCG/ACT inhaler Inhale 2 puffs into the lungs every 6 (six) hours as needed for wheezing. 1 Inhaler 0  . amLODipine (NORVASC) 5 MG tablet Take 2 tablets (10 mg total) by mouth daily. (Patient taking differently: Take 5 mg by mouth daily. ) 60 tablet 1  . colesevelam (WELCHOL) 625 MG tablet Take 3 tablets twice daily 180 tablet 5  . cyclobenzaprine (FLEXERIL) 10 MG tablet Take 1 tablet (10 mg total) by mouth 2 (two) times daily as needed for muscle spasms. 20 tablet 0  . ergocalciferol (VITAMIN D2) 50000 UNITS capsule Take 1 capsule (50,000 Units total) by mouth once a week. 12 capsule 0  . hydrochlorothiazide (HYDRODIURIL) 25 MG tablet Take 1 tablet (25 mg total) by mouth daily. 30 tablet 0  . hydrochlorothiazide (HYDRODIURIL) 25 MG tablet Take 1 tablet (25 mg total) by mouth daily. 30 tablet 1  . ibuprofen (ADVIL,MOTRIN) 600 MG tablet Take 1 tablet (600 mg total) by mouth every 6 (six) hours as needed. 30 tablet 0  . metFORMIN (GLUCOPHAGE) 1000 MG tablet Take 1 tablet (1,000 mg total) by mouth 2 (two) times daily. 60 tablet 1  . metFORMIN (GLUCOPHAGE) 500 MG tablet 1000mg  prior to breakfast, 500mg  prior to dinner 90 tablet 2  . amLODipine (NORVASC) 10 MG tablet TAKE 1 TABLET (10 MG TOTAL) BY MOUTH DAILY. (Patient not taking: Reported on 12/26/2017) 30 tablet 0  . cephALEXin (KEFLEX) 500 MG capsule Take 1 capsule (500 mg total) by mouth 4 (four) times daily. 28 capsule 0  . HYDROcodone-acetaminophen (NORCO/VICODIN) 5-325 MG tablet Take 1 tablet by mouth every 6 (six) hours as needed. (Patient not taking: Reported on 12/26/2017) 6 tablet 0  . naproxen (NAPROSYN) 500 MG tablet Take 1 tablet (500 mg total) by mouth 2 (two) times daily. (Patient not taking: Reported on 04/16/2017) 30 tablet 0  . ondansetron (ZOFRAN) 4 MG tablet Take 1 tablet (4 mg total) by mouth every 8 (eight) hours as needed for nausea or vomiting. (Patient not taking: Reported  on 12/26/2017) 12 tablet 0   No facility-administered medications prior to visit.     Allergies  Allergen Reactions  . Penicillins Other (See Comments)    Historical Has patient had a PCN reaction causing immediate rash, facial/tongue/throat swelling, SOB or lightheadedness with hypotension: No Has patient had a PCN reaction causing severe rash involving mucus membranes or skin necrosis: No Has patient had a PCN reaction that required hospitalization: No Has patient had a PCN reaction  occurring within the last 10 years: No If all of the above answers are "NO", then may proceed with Cephalosporin use.    ROS Review of Systems  Constitutional: Negative.   HENT: Negative.   Eyes: Negative.   Respiratory: Negative.   Cardiovascular: Negative.   Gastrointestinal: Positive for abdominal distention (obese).  Endocrine: Negative.   Genitourinary: Negative.   Musculoskeletal: Negative.   Skin: Negative.   Allergic/Immunologic: Negative.   Neurological: Positive for dizziness and headaches.  Hematological: Negative.   Psychiatric/Behavioral: Negative.       Objective:    Physical Exam  Constitutional: She is oriented to person, place, and time. She appears well-developed and well-nourished.  HENT:  Head: Normocephalic and atraumatic.  Eyes: Conjunctivae are normal.  Neck: Normal range of motion. Neck supple.  Cardiovascular: Normal rate, regular rhythm, normal heart sounds and intact distal pulses.  Pulmonary/Chest: Effort normal and breath sounds normal.  Abdominal: Soft. Bowel sounds are normal.  Musculoskeletal: Normal range of motion.  Neurological: She is alert and oriented to person, place, and time. She has normal reflexes.  Skin: Skin is warm and dry.  Psychiatric: She has a normal mood and affect. Her behavior is normal. Judgment and thought content normal.  Nursing note and vitals reviewed.   BP (!) 176/92   Pulse 96   Temp 98.2 F (36.8 C) (Oral)   Ht 5\' 4"   (1.626 m)   Wt 236 lb (107 kg)   LMP 10/14/2018   SpO2 100%   BMI 40.51 kg/m  Wt Readings from Last 3 Encounters:  10/19/18 236 lb (107 kg)  01/13/16 261 lb 9.6 oz (118.7 kg)  01/12/14 274 lb (124.3 kg)     Health Maintenance Due  Topic Date Due  . PNEUMOCOCCAL POLYSACCHARIDE VACCINE AGE 4-64 HIGH RISK  04/25/1978  . OPHTHALMOLOGY EXAM  04/25/1986  . HIV Screening  04/26/1991  . PAP SMEAR-Modifier  04/25/1997  . URINE MICROALBUMIN  11/04/2014  . FOOT EXAM  01/13/2015    There are no preventive care reminders to display for this patient.  Lab Results  Component Value Date   TSH 2.030 10/19/2018   Lab Results  Component Value Date   WBC 9.4 10/19/2018   HGB 12.9 10/19/2018   HCT 39.6 10/19/2018   MCV 83 10/19/2018   PLT 318 10/19/2018   Lab Results  Component Value Date   NA 136 10/19/2018   K 3.7 10/19/2018   CO2 21 10/19/2018   GLUCOSE 321 (H) 10/19/2018   BUN 11 10/19/2018   CREATININE 0.77 10/19/2018   BILITOT 0.2 10/19/2018   ALKPHOS 103 10/19/2018   AST 39 10/19/2018   ALT 63 (H) 10/19/2018   PROT 7.8 10/19/2018   ALBUMIN 4.4 10/19/2018   CALCIUM 9.3 10/19/2018   ANIONGAP 14 12/26/2017   Lab Results  Component Value Date   CHOL 248 (H) 10/19/2018   Lab Results  Component Value Date   HDL 39 (L) 10/19/2018   Lab Results  Component Value Date   LDLCALC 190 (H) 10/19/2018   Lab Results  Component Value Date   TRIG 96 10/19/2018   Lab Results  Component Value Date   CHOLHDL 6.4 (H) 10/19/2018   Lab Results  Component Value Date   HGBA1C 11.7 (A) 10/19/2018   Assessment & Plan:   1. Hospital discharge follow-up  2. Encounter to establish care  3. Essential hypertension, benign We will refill Amlodipine today, and she will take as prescribed. We will re-evaluate at  next office visit.  - amLODipine (NORVASC) 10 MG tablet; Take 1 tablet (10 mg total) by mouth daily.  Dispense: 90 tablet; Refill: 1 - hydrochlorothiazide (HYDRODIURIL)  25 MG tablet; Take 1 tablet (25 mg total) by mouth daily.  Dispense: 30 tablet; Refill: 3  4. Back pain, unspecified back location, unspecified back pain laterality, unspecified chronicity We will initiate Flexeril today. . - cyclobenzaprine (FLEXERIL) 10 MG tablet; Take 1 tablet (10 mg total) by mouth 2 (two) times daily as needed for muscle spasms.  Dispense: 30 tablet; Refill: 3  5. Vitamin D deficiency - ergocalciferol (VITAMIN D2) 1.25 MG (50000 UT) capsule; Take 1 capsule (50,000 Units total) by mouth once a week.  Dispense: 5 capsule; Refill: 3  6. Shortness of breath - albuterol (PROVENTIL HFA) 108 (90 Base) MCG/ACT inhaler; Inhale 2 puffs into the lungs every 6 (six) hours as needed for wheezing.  Dispense: 1 Inhaler; Refill: 6  7. Class 3 severe obesity due to excess calories with serious comorbidity and body mass index (BMI) of 40.0 to 44.9 in adult [facility]) Body mass index is 40.51 kg/m. Goal BMI  is <30. Encouraged efforts to reduce weight include engaging in physical activity as tolerated with goal of 150 minutes per week. Improve dietary choices and eat a meal regimen consistent with a Mediterranean or DASH diet. Reduce simple carbohydrates. Do not skip meals and eat healthy snacks throughout the day to avoid over-eating at dinner. Set a goal weight loss that is achievable for you.  8. Screening for diabetes mellitus Labs are pending. - POCT glycosylated hemoglobin (Hb A1C) - POCT urinalysis dipstick - metFORMIN (GLUCOPHAGE) 1000 MG tablet; Take 1 tablet (1,000 mg total) by mouth 2 (two) times daily.  Dispense: 60 tablet; Refill: 3 - glipiZIDE (GLUCOTROL) 10 MG tablet; Take 1 tablet (10 mg total) by mouth 2 (two) times daily before a meal.  Dispense: 60 tablet; Refill: 3  9. Abnormal urinalysis Results are pending.  - Urine Culture  10. Urinary tract infection with hematuria, site unspecified We will initiate Septra today.  - sulfamethoxazole-trimethoprim (BACTRIM DS,SEPTRA  DS) 800-160 MG tablet; Take 1 tablet by mouth 2 (two) times daily.  Dispense: 14 tablet; Refill: 0  11. Healthcare maintenance Labs results are pending.  - CBC with Differential - Comprehensive metabolic panel - TSH - Lipid Panel - Vitamin D, 25-hydroxy - Vitamin B12  12. Follow up She will follow up in 1 month.   Meds ordered this encounter  Medications  . sulfamethoxazole-trimethoprim (BACTRIM DS,SEPTRA DS) 800-160 MG tablet    Sig: Take 1 tablet by mouth 2 (two) times daily.    Dispense:  14 tablet    Refill:  0  . albuterol (PROVENTIL HFA) 108 (90 Base) MCG/ACT inhaler    Sig: Inhale 2 puffs into the lungs every 6 (six) hours as needed for wheezing.    Dispense:  1 Inhaler    Refill:  6  . amLODipine (NORVASC) 10 MG tablet    Sig: Take 1 tablet (10 mg total) by mouth daily.    Dispense:  90 tablet    Refill:  1  . metFORMIN (GLUCOPHAGE) 1000 MG tablet    Sig: Take 1 tablet (1,000 mg total) by mouth 2 (two) times daily.    Dispense:  60 tablet    Refill:  3  . glipiZIDE (GLUCOTROL) 10 MG tablet    Sig: Take 1 tablet (10 mg total) by mouth 2 (two) times daily before a meal.  Dispense:  60 tablet    Refill:  3  . hydrochlorothiazide (HYDRODIURIL) 25 MG tablet    Sig: Take 1 tablet (25 mg total) by mouth daily.    Dispense:  30 tablet    Refill:  3  . cyclobenzaprine (FLEXERIL) 10 MG tablet    Sig: Take 1 tablet (10 mg total) by mouth 2 (two) times daily as needed for muscle spasms.    Dispense:  30 tablet    Refill:  3  . ergocalciferol (VITAMIN D2) 1.25 MG (50000 UT) capsule    Sig: Take 1 capsule (50,000 Units total) by mouth once a week.    Dispense:  5 capsule    Refill:  3    Orders Placed This Encounter  Procedures  . Urine Culture  . CBC with Differential  . Comprehensive metabolic panel  . TSH  . Lipid Panel  . Vitamin D, 25-hydroxy  . Vitamin B12  . POCT glycosylated hemoglobin (Hb A1C)  . POCT urinalysis dipstick   Referral Orders  No  referral(s) requested today    Kathe Becton,  MSN, FNP-C Patient Tiffany Le, [place] [address] [phone]     Problem List Items Addressed This Visit      Cardiovascular and Mediastinum   Essential hypertension, benign   Relevant Medications   amLODipine (NORVASC) 10 MG tablet   hydrochlorothiazide (HYDRODIURIL) 25 MG tablet    Other Visit Diagnoses    Hospital discharge follow-up    -  Primary   Encounter to establish care       Back pain, unspecified back location, unspecified back pain laterality, unspecified chronicity       Relevant Medications   cyclobenzaprine (FLEXERIL) 10 MG tablet   Vitamin D deficiency       Relevant Medications   ergocalciferol (VITAMIN D2) 1.25 MG (50000 UT) capsule   Shortness of breath       Relevant Medications   albuterol (PROVENTIL HFA) 108 (90 Base) MCG/ACT inhaler   Class 3 severe obesity due to excess calories with serious comorbidity and body mass index (BMI) of 40.0 to 44.9 in adult [facility])       Relevant Medications   metFORMIN (GLUCOPHAGE) 1000 MG tablet   glipiZIDE (GLUCOTROL) 10 MG tablet   Screening for diabetes mellitus       Relevant Medications   metFORMIN (GLUCOPHAGE) 1000 MG tablet   glipiZIDE (GLUCOTROL) 10 MG tablet   Other Relevant Orders   POCT glycosylated hemoglobin (Hb A1C) (Completed)   POCT urinalysis dipstick (Completed)   Abnormal urinalysis       Relevant Orders   Urine Culture (Completed)   Urinary tract infection with hematuria, site unspecified       Relevant Medications   sulfamethoxazole-trimethoprim (BACTRIM DS,SEPTRA DS) 800-160 MG tablet   Healthcare maintenance       Relevant Orders   CBC with Differential (Completed)   Comprehensive metabolic panel (Completed)   TSH (Completed)   Lipid Panel (Completed)   Vitamin D, 25-hydroxy (Completed)   Vitamin B12 (Completed)   Follow up          Meds ordered this encounter  Medications  .  sulfamethoxazole-trimethoprim (BACTRIM DS,SEPTRA DS) 800-160 MG tablet    Sig: Take 1 tablet by mouth 2 (two) times daily.    Dispense:  14 tablet    Refill:  0  . albuterol (PROVENTIL HFA) 108 (90 Base) MCG/ACT inhaler    Sig:  Inhale 2 puffs into the lungs every 6 (six) hours as needed for wheezing.    Dispense:  1 Inhaler    Refill:  6  . amLODipine (NORVASC) 10 MG tablet    Sig: Take 1 tablet (10 mg total) by mouth daily.    Dispense:  90 tablet    Refill:  1  . metFORMIN (GLUCOPHAGE) 1000 MG tablet    Sig: Take 1 tablet (1,000 mg total) by mouth 2 (two) times daily.    Dispense:  60 tablet    Refill:  3  . glipiZIDE (GLUCOTROL) 10 MG tablet    Sig: Take 1 tablet (10 mg total) by mouth 2 (two) times daily before a meal.    Dispense:  60 tablet    Refill:  3  . hydrochlorothiazide (HYDRODIURIL) 25 MG tablet    Sig: Take 1 tablet (25 mg total) by mouth daily.    Dispense:  30 tablet    Refill:  3  . cyclobenzaprine (FLEXERIL) 10 MG tablet    Sig: Take 1 tablet (10 mg total) by mouth 2 (two) times daily as needed for muscle spasms.    Dispense:  30 tablet    Refill:  3  . ergocalciferol (VITAMIN D2) 1.25 MG (50000 UT) capsule    Sig: Take 1 capsule (50,000 Units total) by mouth once a week.    Dispense:  5 capsule    Refill:  3    Follow-up: Return in about 1 month (around 11/19/2018).    Azzie Glatter, FNP

## 2018-10-20 LAB — CBC WITH DIFFERENTIAL/PLATELET
Basophils Absolute: 0 10*3/uL (ref 0.0–0.2)
Basos: 0 %
EOS (ABSOLUTE): 0.1 10*3/uL (ref 0.0–0.4)
Eos: 1 %
Hematocrit: 39.6 % (ref 34.0–46.6)
Hemoglobin: 12.9 g/dL (ref 11.1–15.9)
Immature Grans (Abs): 0 10*3/uL (ref 0.0–0.1)
Immature Granulocytes: 0 %
Lymphocytes Absolute: 3 10*3/uL (ref 0.7–3.1)
Lymphs: 32 %
MCH: 26.9 pg (ref 26.6–33.0)
MCHC: 32.6 g/dL (ref 31.5–35.7)
MCV: 83 fL (ref 79–97)
Monocytes Absolute: 0.7 10*3/uL (ref 0.1–0.9)
Monocytes: 7 %
Neutrophils Absolute: 5.6 10*3/uL (ref 1.4–7.0)
Neutrophils: 60 %
Platelets: 318 10*3/uL (ref 150–450)
RBC: 4.79 x10E6/uL (ref 3.77–5.28)
RDW: 11.8 % (ref 11.7–15.4)
WBC: 9.4 10*3/uL (ref 3.4–10.8)

## 2018-10-20 LAB — COMPREHENSIVE METABOLIC PANEL
ALT: 63 IU/L — ABNORMAL HIGH (ref 0–32)
AST: 39 IU/L (ref 0–40)
Albumin/Globulin Ratio: 1.3 (ref 1.2–2.2)
Albumin: 4.4 g/dL (ref 3.8–4.8)
Alkaline Phosphatase: 103 IU/L (ref 39–117)
BUN/Creatinine Ratio: 14 (ref 9–23)
BUN: 11 mg/dL (ref 6–24)
Bilirubin Total: 0.2 mg/dL (ref 0.0–1.2)
CO2: 21 mmol/L (ref 20–29)
Calcium: 9.3 mg/dL (ref 8.7–10.2)
Chloride: 99 mmol/L (ref 96–106)
Creatinine, Ser: 0.77 mg/dL (ref 0.57–1.00)
GFR calc Af Amer: 110 mL/min/{1.73_m2} (ref >59)
GFR calc non Af Amer: 96 mL/min/{1.73_m2} (ref >59)
Globulin, Total: 3.4 g/dL (ref 1.5–4.5)
Glucose: 321 mg/dL — ABNORMAL HIGH (ref 65–99)
Potassium: 3.7 mmol/L (ref 3.5–5.2)
Sodium: 136 mmol/L (ref 134–144)
Total Protein: 7.8 g/dL (ref 6.0–8.5)

## 2018-10-20 LAB — TSH: TSH: 2.03 u[IU]/mL (ref 0.450–4.500)

## 2018-10-20 LAB — VITAMIN D 25 HYDROXY (VIT D DEFICIENCY, FRACTURES): Vit D, 25-Hydroxy: 5.1 ng/mL — ABNORMAL LOW (ref 30.0–100.0)

## 2018-10-20 LAB — VITAMIN B12: Vitamin B-12: 440 pg/mL (ref 232–1245)

## 2018-10-20 LAB — LIPID PANEL
Chol/HDL Ratio: 6.4 ratio — ABNORMAL HIGH (ref 0.0–4.4)
Cholesterol, Total: 248 mg/dL — ABNORMAL HIGH (ref 100–199)
HDL: 39 mg/dL — ABNORMAL LOW (ref >39)
LDL Calculated: 190 mg/dL — ABNORMAL HIGH (ref 0–99)
Triglycerides: 96 mg/dL (ref 0–149)
VLDL Cholesterol Cal: 19 mg/dL (ref 5–40)

## 2018-10-21 LAB — URINE CULTURE

## 2018-10-22 ENCOUNTER — Telehealth: Payer: Self-pay

## 2018-10-22 NOTE — Telephone Encounter (Signed)
Advised patient septra was sent to pharmacy.

## 2018-10-24 DIAGNOSIS — Z6841 Body Mass Index (BMI) 40.0 and over, adult: Secondary | ICD-10-CM

## 2018-10-24 DIAGNOSIS — M549 Dorsalgia, unspecified: Secondary | ICD-10-CM | POA: Insufficient documentation

## 2018-10-24 DIAGNOSIS — E66813 Obesity, class 3: Secondary | ICD-10-CM | POA: Insufficient documentation

## 2018-10-26 ENCOUNTER — Other Ambulatory Visit: Payer: Self-pay | Admitting: Family Medicine

## 2018-10-26 DIAGNOSIS — E7800 Pure hypercholesterolemia, unspecified: Secondary | ICD-10-CM

## 2018-10-26 MED ORDER — ATORVASTATIN CALCIUM 10 MG PO TABS: 10.0000 mg | ORAL_TABLET | Freq: Every day | ORAL | 3 refills | Status: DC

## 2018-11-01 ENCOUNTER — Telehealth: Payer: Self-pay

## 2018-11-01 NOTE — Telephone Encounter (Signed)
Patient would like to know if you were gonna change the antibiotic to a liquid form for the Septra.

## 2018-11-02 ENCOUNTER — Other Ambulatory Visit: Payer: Self-pay | Admitting: Family Medicine

## 2018-11-02 DIAGNOSIS — N390 Urinary tract infection, site not specified: Secondary | ICD-10-CM

## 2018-11-02 DIAGNOSIS — R319 Hematuria, unspecified: Secondary | ICD-10-CM

## 2018-11-02 MED ORDER — SULFAMETHOXAZOLE-TRIMETHOPRIM 400-80 MG/5ML IV SOLN: 400.0000 mg | Freq: Two times a day (BID) | INTRAVENOUS | 0 refills | Status: AC

## 2018-11-03 NOTE — Telephone Encounter (Signed)
Patient notified

## 2018-11-22 ENCOUNTER — Ambulatory Visit: Payer: Self-pay | Admitting: Family Medicine

## 2018-11-23 ENCOUNTER — Ambulatory Visit (INDEPENDENT_AMBULATORY_CARE_PROVIDER_SITE_OTHER): Payer: Self-pay | Admitting: Family Medicine

## 2018-11-23 ENCOUNTER — Other Ambulatory Visit: Payer: Self-pay

## 2018-11-23 DIAGNOSIS — Z6841 Body Mass Index (BMI) 40.0 and over, adult: Secondary | ICD-10-CM

## 2018-11-23 DIAGNOSIS — I10 Essential (primary) hypertension: Secondary | ICD-10-CM

## 2018-11-23 DIAGNOSIS — E7800 Pure hypercholesterolemia, unspecified: Secondary | ICD-10-CM

## 2018-11-23 DIAGNOSIS — N390 Urinary tract infection, site not specified: Secondary | ICD-10-CM

## 2018-11-23 DIAGNOSIS — E1169 Type 2 diabetes mellitus with other specified complication: Secondary | ICD-10-CM

## 2018-11-23 DIAGNOSIS — E559 Vitamin D deficiency, unspecified: Secondary | ICD-10-CM

## 2018-11-23 DIAGNOSIS — R319 Hematuria, unspecified: Secondary | ICD-10-CM

## 2018-11-23 DIAGNOSIS — E66813 Obesity, class 3: Secondary | ICD-10-CM

## 2018-11-23 MED ORDER — METFORMIN HCL 500 MG PO TABS: 500.0000 mg | ORAL_TABLET | Freq: Two times a day (BID) | ORAL | 3 refills | Status: DC

## 2018-11-23 MED ORDER — SULFAMETHOXAZOLE-TRIMETHOPRIM 200-40 MG/5ML PO SUSP: 10.0000 mL | Freq: Two times a day (BID) | ORAL | 0 refills | Status: AC

## 2018-11-23 NOTE — Progress Notes (Signed)
Virtual Visit via Telephone Note  I connected with Tiffany Le on 11/25/18 at  1:20 PM EDT by telephone and verified that I am speaking with the correct person using two identifiers.   I discussed the limitations, risks, security and privacy concerns of performing an evaluation and management service by telephone and the availability of in person appointments. I also discussed with the patient that there may be a patient responsible charge related to this service. The patient expressed understanding and agreed to proceed.   History of Present Illness:  Past Medical History:  Diagnosis Date  . Diabetes mellitus without complication ([place])   . Hyperlipidemia age 30  . Hypertension age 65  . Iron deficiency anemia   . Obesity     Current Outpatient Medications on File Prior to Visit  Medication Sig Dispense Refill  . albuterol (PROVENTIL HFA) 108 (90 Base) MCG/ACT inhaler Inhale 2 puffs into the lungs every 6 (six) hours as needed for wheezing. 1 Inhaler 6  . amLODipine (NORVASC) 10 MG tablet Take 1 tablet (10 mg total) by mouth daily. 90 tablet 1  . aspirin EC 81 MG tablet Take 81 mg by mouth daily.    Marland Kitchen atorvastatin (LIPITOR) 10 MG tablet Take 1 tablet (10 mg total) by mouth daily. 30 tablet 3  . cyclobenzaprine (FLEXERIL) 10 MG tablet Take 1 tablet (10 mg total) by mouth 2 (two) times daily as needed for muscle spasms. 30 tablet 3  . ergocalciferol (VITAMIN D2) 1.25 MG (50000 UT) capsule Take 1 capsule (50,000 Units total) by mouth once a week. 5 capsule 3  . glipiZIDE (GLUCOTROL) 10 MG tablet Take 1 tablet (10 mg total) by mouth 2 (two) times daily before a meal. 60 tablet 3  . hydrochlorothiazide (HYDRODIURIL) 25 MG tablet Take 1 tablet (25 mg total) by mouth daily. 30 tablet 3   No current facility-administered medications on file prior to visit.     Current Status: Since her last office visit, she is doing well with no complaints. She states that she has been feeling more  fatigue lately. She reports dizziness, shortness of breath,heart palpitations, and nausea. She denies visual changes, chest pain, cough, and falls.  She has occasional headaches and dizziness with position changes. Denies severe headaches, confusion, seizures, double vision, and blurred vision, and vomiting. She has not been checking her preprandial blood glucose levels. She denies frequent urination, blurred vision, excessive hunger, excessive thirst, weight gain, weight loss, and poor wound healing.   She denies fevers, chills, recent infections, weight loss, and night sweats. No reports of GI problems such as diarrhea, and constipation. She has no reports of blood in stools, dysuria and hematuria. No depression or anxiety reported today. She denies pain today.    Observations/Objective:  Telephone Virtual Visit   Assessment and Plan:  1. Essential hypertension, benign She will purchase a home blood pressure monitor soon to record blood pressures. Continue Amlodipine and HCTZ as prescribed. She will continue to decrease high sodium intake, excessive alcohol intake, increase potassium intake, smoking cessation, and increase physical activity of at least 30 minutes of cardio activity daily. She will continue to follow Heart Healthy or DASH diet.  2. Type 2 diabetes mellitus with other specified complication, without long-term current use of insulin (HCC) Hgb A1c elevated at 11.7 on 10/19/2018. Continue Glucotrol and Metformin as prescribed. She will continue to decrease foods/beverages high in sugars and carbs and follow Heart Healthy or DASH diet. Increase physical activity to at least  30 minutes cardio exercise daily.   3. Pure hypercholesterolemia Continue Atorvastatin as prescribed.   4. Class 3 severe obesity due to excess calories with serious comorbidity and body mass index (BMI) of 40.0 to 44.9 in adult (HCC) Goal BMI  is <30. Encouraged efforts to reduce weight include engaging in  physical activity as tolerated with goal of 150 minutes per week. Improve dietary choices and eat a meal regimen consistent with a Mediterranean or DASH diet. Reduce simple carbohydrates. Do not skip meals and eat healthy snacks throughout the day to avoid over-eating at dinner. Set a goal weight loss that is achievable for you.  5. Urinary tract infection with hematuria, site unspecified We will initiate antibiotic for UTI today.  - sulfamethoxazole-trimethoprim (BACTRIM,SEPTRA) 200-40 MG/5ML suspension; Take 10 mLs by mouth 2 (two) times daily for 7 days.  Dispense: 140 mL; Refill: 0  6. Vitamin D deficiency Continue Vitamin D Supplement as prescribed.   Follow Up Instructions:  She will follow up in 3 months for assessment and re-assess of Hgb A1c.     I discussed the assessment and treatment plan with the patient. The patient was provided an opportunity to ask questions and all were answered. The patient agreed with the plan and demonstrated an understanding of the instructions.   The patient was advised to call back or seek an in-person evaluation if the symptoms worsen or if the condition fails to improve as anticipated.  I provided 15-20 minutes of non-face-to-face time during this encounter.   Azzie Glatter, FNP

## 2019-02-22 ENCOUNTER — Ambulatory Visit: Payer: Self-pay | Admitting: Family Medicine

## 2019-03-11 ENCOUNTER — Other Ambulatory Visit: Payer: Self-pay | Admitting: Family Medicine

## 2019-03-11 DIAGNOSIS — E7800 Pure hypercholesterolemia, unspecified: Secondary | ICD-10-CM

## 2019-06-09 ENCOUNTER — Other Ambulatory Visit: Payer: Self-pay | Admitting: Family Medicine

## 2019-06-09 DIAGNOSIS — E7800 Pure hypercholesterolemia, unspecified: Secondary | ICD-10-CM

## 2019-07-15 ENCOUNTER — Other Ambulatory Visit: Payer: Self-pay | Admitting: Family Medicine

## 2019-07-15 DIAGNOSIS — I10 Essential (primary) hypertension: Secondary | ICD-10-CM

## 2019-07-20 ENCOUNTER — Encounter: Payer: Self-pay | Admitting: Family Medicine

## 2019-07-20 ENCOUNTER — Ambulatory Visit (INDEPENDENT_AMBULATORY_CARE_PROVIDER_SITE_OTHER): Payer: Self-pay | Admitting: Family Medicine

## 2019-07-20 ENCOUNTER — Other Ambulatory Visit: Payer: Self-pay

## 2019-07-20 VITALS — BP 151/80 | HR 97 | Temp 97.9°F

## 2019-07-20 DIAGNOSIS — E66813 Obesity, class 3: Secondary | ICD-10-CM

## 2019-07-20 DIAGNOSIS — R05 Cough: Secondary | ICD-10-CM

## 2019-07-20 DIAGNOSIS — R059 Cough, unspecified: Secondary | ICD-10-CM

## 2019-07-20 DIAGNOSIS — R8290 Unspecified abnormal findings in urine: Secondary | ICD-10-CM

## 2019-07-20 DIAGNOSIS — R0989 Other specified symptoms and signs involving the circulatory and respiratory systems: Secondary | ICD-10-CM

## 2019-07-20 DIAGNOSIS — Z0000 Encounter for general adult medical examination without abnormal findings: Secondary | ICD-10-CM

## 2019-07-20 DIAGNOSIS — E1169 Type 2 diabetes mellitus with other specified complication: Secondary | ICD-10-CM

## 2019-07-20 DIAGNOSIS — M549 Dorsalgia, unspecified: Secondary | ICD-10-CM

## 2019-07-20 DIAGNOSIS — I10 Essential (primary) hypertension: Secondary | ICD-10-CM

## 2019-07-20 DIAGNOSIS — Z6841 Body Mass Index (BMI) 40.0 and over, adult: Secondary | ICD-10-CM

## 2019-07-20 DIAGNOSIS — E559 Vitamin D deficiency, unspecified: Secondary | ICD-10-CM

## 2019-07-20 DIAGNOSIS — R7309 Other abnormal glucose: Secondary | ICD-10-CM

## 2019-07-20 DIAGNOSIS — Z09 Encounter for follow-up examination after completed treatment for conditions other than malignant neoplasm: Secondary | ICD-10-CM

## 2019-07-20 LAB — POCT URINALYSIS DIPSTICK
Bilirubin, UA: NEGATIVE
Glucose, UA: POSITIVE — AB
Ketones, UA: NEGATIVE
Nitrite, UA: POSITIVE
Protein, UA: POSITIVE — AB
Spec Grav, UA: >=1.03 — AB (ref 1.010–1.025)
Urobilinogen, UA: 1 E.U./dL
pH, UA: 5.5 (ref 5.0–8.0)

## 2019-07-20 LAB — POCT GLYCOSYLATED HEMOGLOBIN (HGB A1C): Hemoglobin A1C: 10.4 % — AB (ref 4.0–5.6)

## 2019-07-20 LAB — GLUCOSE, POCT (MANUAL RESULT ENTRY): POC Glucose: 242 mg/dl — AB (ref 70–99)

## 2019-07-20 MED ORDER — HYDROCHLOROTHIAZIDE 25 MG PO TABS: 25.0000 mg | ORAL_TABLET | Freq: Every day | ORAL | 0 refills | Status: DC

## 2019-07-20 MED ORDER — AMLODIPINE BESYLATE 5 MG PO TABS: ORAL_TABLET | ORAL | 3 refills | Status: DC

## 2019-07-20 MED ORDER — HYDROCODONE-HOMATROPINE 5-1.5 MG/5ML PO SYRP: 5.0000 mL | ORAL_SOLUTION | Freq: Three times a day (TID) | ORAL | 0 refills | Status: DC | PRN

## 2019-07-20 MED ORDER — DOXYCYCLINE HYCLATE 100 MG PO TABS: 100.0000 mg | ORAL_TABLET | Freq: Two times a day (BID) | ORAL | 0 refills | Status: DC

## 2019-07-20 MED ORDER — ERGOCALCIFEROL 1.25 MG (50000 UT) PO CAPS: 50000.0000 [IU] | ORAL_CAPSULE | ORAL | 3 refills | Status: DC

## 2019-07-20 MED ORDER — AMLODIPINE BESYLATE 5 MG PO TABS: 5.0000 mg | ORAL_TABLET | Freq: Every day | ORAL | 1 refills | Status: DC

## 2019-07-20 MED ORDER — CYCLOBENZAPRINE HCL 10 MG PO TABS: 10.0000 mg | ORAL_TABLET | Freq: Two times a day (BID) | ORAL | 3 refills | Status: DC | PRN

## 2019-07-20 MED ORDER — METFORMIN HCL 500 MG PO TABS: 500.0000 mg | ORAL_TABLET | Freq: Two times a day (BID) | ORAL | 3 refills | Status: DC

## 2019-07-20 NOTE — Patient Instructions (Signed)
Cough, Adult A cough helps to clear your throat and lungs. A cough may be a sign of an illness or another medical condition. An acute cough may only last 2-3 weeks, while a chronic cough may last 8 or more weeks. Many things can cause a cough. They include:  Germs (viruses or bacteria) that attack the airway.  Breathing in things that bother (irritate) your lungs.  Allergies.  Asthma.  Mucus that runs down the back of your throat (postnasal drip).  Smoking.  Acid backing up from the stomach into the tube that moves food from the mouth to the stomach (gastroesophageal reflux).  Some medicines.  Lung problems.  Other medical conditions, such as heart failure or a blood clot in the lung (pulmonary embolism). Follow these instructions at home: Medicines  Take over-the-counter and prescription medicines only as told by your doctor.  Talk with your doctor before you take medicines that stop a cough (coughsuppressants). Lifestyle   Do not smoke, and try not to be around smoke. Do not use any products that contain nicotine or tobacco, such as cigarettes, e-cigarettes, and chewing tobacco. If you need help quitting, ask your doctor.  Drink enough fluid to keep your pee (urine) pale yellow.  Avoid caffeine.  Do not drink alcohol if your doctor tells you not to drink. General instructions   Watch for any changes in your cough. Tell your doctor about them.  Always cover your mouth when you cough.  Stay away from things that make you cough, such as perfume, candles, campfire smoke, or cleaning products.  If the air is dry, use a cool mist vaporizer or humidifier in your home.  If your cough is worse at night, try using extra pillows to raise your head up higher while you sleep.  Rest as needed.  Keep all follow-up visits as told by your doctor. This is important. Contact a doctor if:  You have new symptoms.  You cough up pus.  Your cough does not get better after 2-3  weeks, or your cough gets worse.  Cough medicine does not help your cough and you are not sleeping well.  You have pain that gets worse or pain that is not helped with medicine.  You have a fever.  You are losing weight and you do not know why.  You have night sweats. Get help right away if:  You cough up blood.  You have trouble breathing.  Your heartbeat is very fast. These symptoms may be an emergency. Do not wait to see if the symptoms will go away. Get medical help right away. Call your local emergency services (911 in the [place] Do not drive yourself to the hospital. Summary  A cough helps to clear your throat and lungs. Many things can cause a cough.  Take over-the-counter and prescription medicines only as told by your doctor.  Always cover your mouth when you cough.  Contact a doctor if you have new symptoms or you have a cough that does not get better or gets worse. This information is not intended to replace advice given to you by your health care provider. Make sure you discuss any questions you have with your health care provider. Document Released: 04/10/2011 Document Revised: 08/16/2018 Document Reviewed: 08/16/2018 Elsevier Patient Education  [place]. Doxycycline oral tablets (Periodontitis) What is this medicine? DOXYCYCLINE (dox i SYE kleen) is a tetracycline antibiotic. It kills certain bacteria or stops their growth. This medicine is used to treat a dental infection  called periodontitis. This medicine may be used for other purposes; ask your health care provider or pharmacist if you have questions. COMMON BRAND NAME(S): Oraxyl, Periostat What should I tell my health care provider before I take this medicine? They need to know if you have any of these conditions:  liver disease  long exposure to sunlight like working outdoors  stomach problems like colitis  an unusual or allergic reaction to doxycycline, tetracycline antibiotics, other  medicines, foods, dyes, or preservatives  pregnant or trying to get pregnant  breast-feeding How should I use this medicine? Take this medicine by mouth with a full glass of water. Follow the directions on the prescription label. Take this medicine at least 1 hour before or 2 hours after food. Take your medicine at regular intervals. Do not take your medicine more often than directed. Take all of your medicine as directed even if you think you are better. Do not skip doses or stop your medicine early. Talk to your pediatrician regarding the use of this medicine in children. Special care may be needed. Overdosage: If you think you have taken too much of this medicine contact a poison control center or emergency room at once. NOTE: This medicine is only for you. Do not share this medicine with others. What if I miss a dose? If you miss a dose, take it as soon as you can. If it is almost time for your next dose, take only that dose. Do not take double or extra doses. What may interact with this medicine?  antacids  barbiturates  birth control pills  bismuth subsalicylate  carbamazepine  methoxyflurane  other antibiotics  phenytoin  vitamins that contain iron  warfarin This list may not describe all possible interactions. Give your health care provider a list of all the medicines, herbs, non-prescription drugs, or dietary supplements you use. Also tell them if you smoke, drink alcohol, or use illegal drugs. Some items may interact with your medicine. What should I watch for while using this medicine? Tell your doctor or health care professional if your symptoms do not improve. Do not treat diarrhea with over the counter products. Contact your doctor if you have diarrhea that lasts more than 2 days or if it is severe and watery. Do not take this medicine just before going to bed. It may not dissolve properly when you lay down and can cause pain in your throat. Drink plenty of fluids  while taking this medicine to also help reduce irritation in your throat. This medicine can make you more sensitive to the sun. Keep out of the sun. If you cannot avoid being in the sun, wear protective clothing and use sunscreen. Do not use sun lamps or tanning beds/booths. Birth control pills may not work properly while you are taking this medicine. Talk to your doctor about using an extra method of birth control. If you are being treated for a sexually transmitted infection, avoid sexual contact until you have finished your treatment. Your sexual partner may also need treatment. Avoid antacids, aluminum, calcium, magnesium, and iron products for 4 hours before and 2 hours after taking a dose of this medicine. What side effects may I notice from receiving this medicine? Side effects that you should report to your doctor or health care professional as soon as possible:  allergic reactions like skin rash, itching or hives, swelling of the face, lips, or tongue  difficulty breathing  fever  itching in the rectal or genital area  pain on  swallowing  rash, fever, and swollen lymph nodes  redness, blistering, peeling or loosening of the skin, including inside the mouth  severe stomach pain or cramps  unusual bleeding or bruising  unusually weak or tired  yellowing of the eyes or skin Side effects that usually do not require medical attention (report to your doctor or health care professional if they continue or are bothersome):  diarrhea  loss of appetite  nausea, vomiting This list may not describe all possible side effects. Call your doctor for medical advice about side effects. You may report side effects to FDA at 1-800-FDA-1088. Where should I keep my medicine? Keep out of the reach of children. Store at room temperature between 15 and 30 degrees C (59 and 86 degrees F). Protect from light. Keep container tightly closed. Throw away any unused medicine after the expiration  date. Taking this medicine after the expiration date can make you seriously ill. NOTE: This sheet is a summary. It may not cover all possible information. If you have questions about this medicine, talk to your doctor, pharmacist, or health care provider.  2020 Elsevier/Gold Standard (2018-10-28 13:38:11) Homatropine; Hydrocodone oral syrup What is this medicine? HYDROCODONE (hye droe KOE done) is used to help relieve cough. This medicine may be used for other purposes; ask your health care provider or pharmacist if you have questions. COMMON BRAND NAME(S): Hycodan, Hydromet, Hydropane, Mycodone What should I tell my health care provider before I take this medicine? They need to know if you have any of these conditions:  Addison's disease  brain tumor  gallbladder disease  glaucoma  head injury  heart disease  history of a drug or alcohol abuse problem  history of irregular heartbeat  if you often drink alcohol  kidney disease  liver disease  low blood pressure  lung or breathing disease, like asthma  mental illness  pancreatic disease  seizures  stomach or intestine problems  thyroid disease  trouble passing urine  an unusual or allergic reaction to hydrocodone, other medicines, foods, dyes, or preservatives  pregnant or trying to get pregnant  breast-feeding How should I use this medicine? Take this medicine by mouth. Follow the directions on the prescription label. You can take it with or without food. If it upsets your stomach, take it with food. Use a specially marked spoon or container to measure each dose. Ask your pharmacist if you do not have one. Household spoons are not accurate. Do not to overfill. Rinse the measuring device with water after each use. Take your medicine at regular intervals. Do not take it more often than directed. A special MedGuide will be given to you by the pharmacist with each prescription and refill. Be sure to read this  information carefully each time. Talk to your pediatrician regarding the use of this medicine in children. This medicine is not approved for use in children. Overdosage: If you think you have taken too much of this medicine contact a poison control center or emergency room at once. NOTE: This medicine is only for you. Do not share this medicine with others. What if I miss a dose? If you miss a dose, take it as soon as you can. If it is almost time for your next dose, take only that dose. Do not take double or extra doses. What may interact with this medicine? Do not take this medicine with any of the following medications:  alcohol  antihistamines for allergy, cough and cold  certain medicines for anxiety  or sleep  certain medicines for depression like amitriptyline, fluoxetine, sertraline  certain medicines for seizures like carbamazepine, phenobarbital, phenytoin, primidone  general anesthetics like halothane, isoflurane, methoxyflurane, propofol  local anesthetics like lidocaine, pramoxine, tetracaine  MAOIs like Carbex, Eldepryl, Marplan, Nardil, and Parnate  other narcotic medicines (opiates) for pain or cough  phenothiazines like chlorpromazine, mesoridazine, prochlorperazine, thioridazine This medicine may also interact with the following medications:  antiviral medicines for HIV and AIDS  atropine  certain antibiotics like clarithromycin, erythromycin  certain medicines for bladder problems like oxybutynin, tolterodine  certain medicines for fungal infections like ketoconazole and itraconazole  certain medicines for Parkinson's disease like benztropine, trihexyphenidyl  certain medicines for stomach problems like dicyclomine, hyoscyamine  certain medicines for travel sickness like scopolamine  ipratropium  rifampin This list may not describe all possible interactions. Give your health care provider a list of all the medicines, herbs, non-prescription drugs, or  dietary supplements you use. Also tell them if you smoke, drink alcohol, or use illegal drugs. Some items may interact with your medicine. What should I watch for while using this medicine? Use exactly as directed by your doctor or health care professional. Do not take more than the recommended dose. You may develop tolerance to this medicine if you take it for a long time. Tolerance means that you will get less cough relief with time. Tell your doctor or health care professional if your symptoms do not improve or if they get worse. If you have been taking this medicine for a long time, do not suddenly stop taking it because you may develop a severe reaction. Your body becomes used to the medicine. This does NOT mean you are addicted. Addiction is a behavior related to getting and using a drug for a nonmedical reason. If your doctor wants you to stop the medicine, the dose will be slowly lowered over time to avoid any side effects. There are different types of narcotic medicines (opiates). If you take more than one type at the same time or if you are taking another medicine that also causes drowsiness, you may have more side effects. Give your health care provider a list of all medicines you use. Your doctor will tell you how much medicine to take. Do not take more medicine than directed. Call emergency for help if you have problems breathing or unusual sleepiness. You may get drowsy or dizzy. Do not drive, use machinery, or do anything that needs mental alertness until you know how this medicine affects you. Do not stand or sit up quickly, especially if you are an older patient. This reduces the risk of dizzy or fainting spells. Alcohol may interfere with the effect of this medicine. Avoid alcoholic drinks. This medicine will cause constipation. Try to have a bowel movement at least every 2 to 3 days. If you do not have a bowel movement for 3 days, call your doctor or health care professional. Your mouth may  get dry. Chewing sugarless gum or sucking hard candy, and drinking plenty of water may help. Contact your doctor if the problem does not go away or is severe. What side effects may I notice from receiving this medicine? Side effects that you should report to your doctor or health care professional as soon as possible:  allergic reactions like skin rash, itching or hives, swelling of the face, lips, or tongue  breathing problems  confusion  signs and symptoms of low blood pressure like dizziness; feeling faint or lightheaded, falls; unusually weak  or tired  trouble passing urine or change in the amount of urine Side effects that usually do not require medical attention (report to your doctor or health care professional if they continue or are bothersome):  constipation  dry mouth  nausea, vomiting  tiredness This list may not describe all possible side effects. Call your doctor for medical advice about side effects. You may report side effects to FDA at 1-800-FDA-1088. Where should I keep my medicine? Keep out of the reach of children. This medicine can be abused. Keep your medicine in a safe place to protect it from theft. Do not share this medicine with anyone. Selling or giving away this medicine is dangerous and against the law. This medicine may cause accidental overdose and death if taken by other adults, children, or pets. Mix any unused medicine with a substance like cat littler or coffee grounds. Then throw the medicine away in a sealed container like a sealed bag or a coffee can with a lid. Do not use the medicine after the expiration date. Store at room temperature between 15 and 30 degrees C (59 and 86 degrees F). Protect from light. NOTE: This sheet is a summary. It may not cover all possible information. If you have questions about this medicine, talk to your doctor, pharmacist, or health care provider.  2020 Elsevier/Gold Standard (2017-02-19 16:00:40)

## 2019-07-20 NOTE — Progress Notes (Signed)
Patient [facility]   Established Patient Office Visit  Subjective:  Patient ID: Tiffany Le, female    DOB: 1975/10/01  Age: 43 y.o. MRN: [id number]  CC:  Chief Complaint  Patient presents with  . Follow-up    HTN  . Cough    runny nose, chest congestion (COVID test negative)    HPI Tiffany Le is a 44 year old female who presents for Follow Up today.   Past Medical History:  Diagnosis Date  . Diabetes mellitus without complication ([place])   . Hyperlipidemia age 2  . Hypertension age 16  . Iron deficiency anemia   . Obesity    Current Status: Since her last office visit, she is doing well with no complaints. She has c/o cold symptoms, like wheezing, cough, chest congestion, and breathing difficulties X 7 days now. She states that she has been tested for COVID19 weekly. She currently works in [employer]. She has not taken any medication form relief of symptoms. She has not been monitoring her blood glucose levels regularly. She denies fatigue, frequent urination, blurred vision, excessive hunger, excessive thirst, weight gain, weight loss, and poor wound healing. She continues to check her feet regularly. She denies visual changes, chest pain, cough, shortness of breath, heart palpitations, and falls. She has occasional headaches and dizziness with position changes. Denies severe headaches, confusion, seizures, double vision, and blurred vision, nausea and vomiting. She  denies fevers, chills, recent infections, weight loss, and night sweats. No reports of GI problems such as nausea, vomiting, diarrhea, and constipation. She has no reports of blood in stools, dysuria and hematuria. No depression or anxiety reported today. She denies suicidal ideations, homicidal ideations, or auditory hallucinations. She reports mild back pain today.   Past Surgical History:  Procedure Laterality Date  . CESAREAN SECTION  3/96  . ORIF FEMUR  FRACTURE      Family History  Problem Relation Age of Onset  . Heart disease Mother        CABG <50  . Hypertension Mother   . Diabetes Mother   . Hyperlipidemia Mother   . Heart disease Father   . Hyperlipidemia Father   . Hypertension Father   . Early death Father   . Healthy Daughter   . Diabetes Maternal Aunt   . Cancer Maternal Grandmother        stomach cancer  . Diabetes Maternal Aunt   . Cancer Cousin        colon cancer (dx'd 70)    Social History   Socioeconomic History  . Marital status: Single    Spouse name: Not on file  . Number of children: Not on file  . Years of education: Not on file  . Highest education level: Not on file  Occupational History  . Not on file  Social Needs  . Financial resource strain: Not on file  . Food insecurity    Worry: Not on file    Inability: Not on file  . Transportation needs    Medical: Not on file    Non-medical: Not on file  Tobacco Use  . Smoking status: Never Smoker  . Smokeless tobacco: Never Used  Substance and Sexual Activity  . Alcohol use: No  . Drug use: No  . Sexual activity: Not Currently    Partners: Male    Comment: husband currently in [place], returning 02/2013  Lifestyle  . Physical activity    Days per  week: Not on file    Minutes per session: Not on file  . Stress: Not on file  Relationships  . Social Herbalist on phone: Not on file    Gets together: Not on file    Attends religious service: Not on file    Active member of club or organization: Not on file    Attends meetings of clubs or organizations: Not on file    Relationship status: Not on file  . Intimate partner violence    Fear of current or ex partner: Not on file    Emotionally abused: Not on file    Physically abused: Not on file    Forced sexual activity: Not on file  Other Topics Concern  . 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  Lives with  80 year old daughter, 1 cat Husband is living in [place] (citizen there), trying to come to [place] (has been delayed)    Outpatient Medications Prior to Visit  Medication Sig Dispense Refill  . albuterol (PROVENTIL HFA) 108 (90 Base) MCG/ACT inhaler Inhale 2 puffs into the lungs every 6 (six) hours as needed for wheezing. 1 Inhaler 6  . aspirin EC 81 MG tablet Take 81 mg by mouth daily.    Marland Kitchen atorvastatin (LIPITOR) 10 MG tablet TAKE 1 TABLET BY MOUTH EVERY DAY 90 tablet 1  . amLODipine (NORVASC) 10 MG tablet Take 1 tablet (10 mg total) by mouth daily. 90 tablet 1  . glipiZIDE (GLUCOTROL) 10 MG tablet Take 1 tablet (10 mg total) by mouth 2 (two) times daily before a meal. 60 tablet 3  . cyclobenzaprine (FLEXERIL) 10 MG tablet Take 1 tablet (10 mg total) by mouth 2 (two) times daily as needed for muscle spasms. 30 tablet 3  . ergocalciferol (VITAMIN D2) 1.25 MG (50000 UT) capsule Take 1 capsule (50,000 Units total) by mouth once a week. 5 capsule 3  . hydrochlorothiazide (HYDRODIURIL) 25 MG tablet Take 1 tablet by mouth once daily 30 tablet 0  . metFORMIN (GLUCOPHAGE) 500 MG tablet Take 1 tablet (500 mg total) by mouth 2 (two) times daily with a meal. 180 tablet 3   No facility-administered medications prior to visit.     Allergies  Allergen Reactions  . Penicillins Other (See Comments)    Historical Has patient had a PCN reaction causing immediate rash, facial/tongue/throat swelling, SOB or lightheadedness with hypotension: No Has patient had a PCN reaction causing severe rash involving mucus membranes or skin necrosis: No Has patient had a PCN reaction that required hospitalization: No Has patient had a PCN reaction occurring within the last 10 years: No If all of the above answers are "NO", then may proceed with Cephalosporin use.    ROS Review of Systems  Constitutional: Negative.   HENT: Negative.   Eyes: Negative.   Respiratory: Positive for cough.        Chest congestion   Cardiovascular: Negative.   Gastrointestinal: Positive for abdominal distention.  Endocrine: Negative.   Genitourinary: Negative.   Musculoskeletal: Positive for arthralgias (generalized).  Skin: Negative.   Allergic/Immunologic: Negative.   Neurological: Positive for dizziness (occasional ) and headaches (occasional ).  Hematological: Negative.   Psychiatric/Behavioral: Negative.       Objective:    Physical Exam  Constitutional: She is oriented to person, place, and time. She appears well-developed and well-nourished.  HENT:  Head: Normocephalic  and atraumatic.  Eyes: Conjunctivae are normal.  Neck: Normal range of motion. Neck supple.  Cardiovascular: Normal rate, regular rhythm, normal heart sounds and intact distal pulses.  Pulmonary/Chest: Effort normal and breath sounds normal.  Abdominal: Soft. Bowel sounds are normal.  Musculoskeletal: Normal range of motion.  Neurological: She is alert and oriented to person, place, and time.  Skin: Skin is warm and dry.  Psychiatric: She has a normal mood and affect. Her behavior is normal. Judgment and thought content normal.  Nursing note and vitals reviewed.   BP (!) 151/80   Pulse 97   Temp 97.9 F (36.6 C)  Wt Readings from Last 3 Encounters:  10/19/18 236 lb (107 kg)  01/13/16 261 lb 9.6 oz (118.7 kg)  01/12/14 274 lb (124.3 kg)     Health Maintenance Due  Topic Date Due  . PNEUMOCOCCAL POLYSACCHARIDE VACCINE AGE 78-64 HIGH RISK  04/25/1978  . OPHTHALMOLOGY EXAM  04/25/1986  . HIV Screening  04/26/1991  . PAP SMEAR-Modifier  04/25/1997  . URINE MICROALBUMIN  11/04/2014  . FOOT EXAM  01/13/2015  . INFLUENZA VACCINE  03/12/2019  . HEMOGLOBIN A1C  04/21/2019    There are no preventive care reminders to display for this patient.  Lab Results  Component Value Date   TSH 2.030 10/19/2018   Lab Results  Component Value Date   WBC 9.4 10/19/2018   HGB 12.9 10/19/2018   HCT 39.6 10/19/2018   MCV 83 10/19/2018    PLT 318 10/19/2018   Lab Results  Component Value Date   NA 136 10/19/2018   K 3.7 10/19/2018   CO2 21 10/19/2018   GLUCOSE 321 (H) 10/19/2018   BUN 11 10/19/2018   CREATININE 0.77 10/19/2018   BILITOT 0.2 10/19/2018   ALKPHOS 103 10/19/2018   AST 39 10/19/2018   ALT 63 (H) 10/19/2018   PROT 7.8 10/19/2018   ALBUMIN 4.4 10/19/2018   CALCIUM 9.3 10/19/2018   ANIONGAP 14 12/26/2017   Lab Results  Component Value Date   CHOL 248 (H) 10/19/2018   Lab Results  Component Value Date   HDL 39 (L) 10/19/2018   Lab Results  Component Value Date   LDLCALC 190 (H) 10/19/2018   Lab Results  Component Value Date   TRIG 96 10/19/2018   Lab Results  Component Value Date   CHOLHDL 6.4 (H) 10/19/2018   Lab Results  Component Value Date   HGBA1C 10.4 (A) 07/20/2019      Assessment & Plan:   1. Type 2 diabetes mellitus with other specified complication, without long-term current use of insulin (HCC) Blood glucose level is at 242 today. She will continue medication as prescribed, to decrease foods/beverages high in sugars and carbs and follow Heart Healthy or DASH diet. Increase physical activity to at least 30 minutes cardio exercise daily.  - POCT HgB A1C - Urinalysis Dipstick - Glucose (CBG) - metFORMIN (GLUCOPHAGE) 500 MG tablet; Take 1 tablet (500 mg total) by mouth 2 (two) times daily with a meal.  Dispense: 180 tablet; Refill: 3  2. Chest congestion - HYDROcodone-homatropine (HYCODAN) 5-1.5 MG/5ML syrup; Take 5 mLs by mouth every 8 (eight) hours as needed for cough.  Dispense: 120 mL; Refill: 0 - doxycycline (VIBRA-TABS) 100 MG tablet; Take 1 tablet (100 mg total) by mouth 2 (two) times daily for 10 days.  Dispense: 20 tablet; Refill: 0  3. Hemoglobin A1C greater than 9%, indicating poor diabetic control Moderately improved. Hgb A1c at 10.4 today from  11.7 on 10/19/2018.  - metFORMIN (GLUCOPHAGE) 500 MG tablet; Take 1 tablet (500 mg total) by mouth 2 (two) times daily  with a meal.  Dispense: 180 tablet; Refill: 3  4. Essential hypertension, benign She will continue to take medications as prescribed, to decrease high sodium intake, excessive alcohol intake, increase potassium intake, smoking cessation, and increase physical activity of at least 30 minutes of cardio activity daily. She will continue to follow Heart Healthy or DASH diet. - hydrochlorothiazide (HYDRODIURIL) 25 MG tablet; Take 1 tablet (25 mg total) by mouth daily.  Dispense: 30 tablet; Refill: 0  5. Class 3 severe obesity due to excess calories with serious comorbidity and body mass index (BMI) of 40.0 to 44.9 in adult [facility]) There is no height or weight on file to calculate BMI. Goal BMI  is <30. Encouraged efforts to reduce weight include engaging in physical activity as tolerated with goal of 150 minutes per week. Improve dietary choices and eat a meal regimen consistent with a Mediterranean or DASH diet. Reduce simple carbohydrates. Do not skip meals and eat healthy snacks throughout the day to avoid over-eating at dinner. Set a goal weight loss that is achievable for you.  6. Back pain, unspecified back location, unspecified back pain laterality, unspecified chronicity - cyclobenzaprine (FLEXERIL) 10 MG tablet; Take 1 tablet (10 mg total) by mouth 2 (two) times daily as needed for muscle spasms.  Dispense: 30 tablet; Refill: 3  7. Vitamin D deficiency - ergocalciferol (VITAMIN D2) 1.25 MG (50000 UT) capsule; Take 1 capsule (50,000 Units total) by mouth once a week.  Dispense: 5 capsule; Refill: 3  8. Abnormal urinalysis Results are pending.  - Urine Culture  9. Cough - HYDROcodone-homatropine (HYCODAN) 5-1.5 MG/5ML syrup; Take 5 mLs by mouth every 8 (eight) hours as needed for cough.  Dispense: 120 mL; Refill: 0  10. Healthcare maintenance  11. Follow up She will follow up in 1 month.   Meds ordered this encounter  Medications  . HYDROcodone-homatropine (HYCODAN) 5-1.5 MG/5ML syrup     Sig: Take 5 mLs by mouth every 8 (eight) hours as needed for cough.    Dispense:  120 mL    Refill:  0  . doxycycline (VIBRA-TABS) 100 MG tablet    Sig: Take 1 tablet (100 mg total) by mouth 2 (two) times daily for 10 days.    Dispense:  20 tablet    Refill:  0  . cyclobenzaprine (FLEXERIL) 10 MG tablet    Sig: Take 1 tablet (10 mg total) by mouth 2 (two) times daily as needed for muscle spasms.    Dispense:  30 tablet    Refill:  3  . ergocalciferol (VITAMIN D2) 1.25 MG (50000 UT) capsule    Sig: Take 1 capsule (50,000 Units total) by mouth once a week.    Dispense:  5 capsule    Refill:  3  . hydrochlorothiazide (HYDRODIURIL) 25 MG tablet    Sig: Take 1 tablet (25 mg total) by mouth daily.    Dispense:  30 tablet    Refill:  0  . metFORMIN (GLUCOPHAGE) 500 MG tablet    Sig: Take 1 tablet (500 mg total) by mouth 2 (two) times daily with a meal.    Dispense:  180 tablet    Refill:  3  . DISCONTD: amLODipine (NORVASC) 5 MG tablet    Sig: Take 1 tablet (5 mg total) by mouth daily.    Dispense:  90 tablet  Refill:  1  . amLODipine (NORVASC) 5 MG tablet    Sig: Take 2 tablets (Total= 10 mg), by mouth daily.    Dispense:  60 tablet    Refill:  3    Orders Placed This Encounter  Procedures  . Urine Culture  . POCT HgB A1C  . Urinalysis Dipstick  . Glucose (CBG)   Referral Orders  No referral(s) requested today    Kathe Becton,  MSN, FNP-BC [facility] [address] [place], [place] [address] [phone] fax    Problem List Items Addressed This Visit      Cardiovascular and Mediastinum   Essential hypertension, benign   Relevant Medications   hydrochlorothiazide (HYDRODIURIL) 25 MG tablet   amLODipine (NORVASC) 5 MG tablet     Other   Back pain   Relevant Medications   cyclobenzaprine (FLEXERIL) 10 MG tablet   Class 3 severe obesity due to excess calories with serious  comorbidity and body mass index (BMI) of 40.0 to 44.9 in adult [facility])   Relevant Medications   metFORMIN (GLUCOPHAGE) 500 MG tablet    Other Visit Diagnoses    Type 2 diabetes mellitus with other specified complication, without long-term current use of insulin (HCC)    -  Primary   Relevant Medications   metFORMIN (GLUCOPHAGE) 500 MG tablet   Other Relevant Orders   POCT HgB A1C (Completed)   Urinalysis Dipstick (Completed)   Glucose (CBG) (Completed)   Chest congestion       Relevant Medications   HYDROcodone-homatropine (HYCODAN) 5-1.5 MG/5ML syrup   doxycycline (VIBRA-TABS) 100 MG tablet   Hemoglobin A1C greater than 9%, indicating poor diabetic control       Relevant Medications   metFORMIN (GLUCOPHAGE) 500 MG tablet   Vitamin D deficiency       Relevant Medications   ergocalciferol (VITAMIN D2) 1.25 MG (50000 UT) capsule   Abnormal urinalysis       Relevant Orders   Urine Culture   Cough       Healthcare maintenance       Follow up          Meds ordered this encounter  Medications  . HYDROcodone-homatropine (HYCODAN) 5-1.5 MG/5ML syrup    Sig: Take 5 mLs by mouth every 8 (eight) hours as needed for cough.    Dispense:  120 mL    Refill:  0  . doxycycline (VIBRA-TABS) 100 MG tablet    Sig: Take 1 tablet (100 mg total) by mouth 2 (two) times daily for 10 days.    Dispense:  20 tablet    Refill:  0  . cyclobenzaprine (FLEXERIL) 10 MG tablet    Sig: Take 1 tablet (10 mg total) by mouth 2 (two) times daily as needed for muscle spasms.    Dispense:  30 tablet    Refill:  3  . ergocalciferol (VITAMIN D2) 1.25 MG (50000 UT) capsule    Sig: Take 1 capsule (50,000 Units total) by mouth once a week.    Dispense:  5 capsule    Refill:  3  . hydrochlorothiazide (HYDRODIURIL) 25 MG tablet    Sig: Take 1 tablet (25 mg total) by mouth daily.    Dispense:  30 tablet    Refill:  0  . metFORMIN (GLUCOPHAGE) 500 MG tablet    Sig: Take 1 tablet (500 mg total) by mouth 2 (two)  times daily with a meal.  Dispense:  180 tablet    Refill:  3  . DISCONTD: amLODipine (NORVASC) 5 MG tablet    Sig: Take 1 tablet (5 mg total) by mouth daily.    Dispense:  90 tablet    Refill:  1  . amLODipine (NORVASC) 5 MG tablet    Sig: Take 2 tablets (Total= 10 mg), by mouth daily.    Dispense:  60 tablet    Refill:  3    Follow-up: Return in about 1 month (around 08/20/2019).    Azzie Glatter, FNP

## 2019-07-22 LAB — URINE CULTURE

## 2019-08-24 ENCOUNTER — Ambulatory Visit: Payer: Self-pay | Admitting: Family Medicine

## 2019-08-28 ENCOUNTER — Other Ambulatory Visit: Payer: Self-pay | Admitting: Family Medicine

## 2019-08-28 DIAGNOSIS — Z131 Encounter for screening for diabetes mellitus: Secondary | ICD-10-CM

## 2019-08-29 ENCOUNTER — Other Ambulatory Visit: Payer: Self-pay | Admitting: Family Medicine

## 2019-08-29 DIAGNOSIS — Z131 Encounter for screening for diabetes mellitus: Secondary | ICD-10-CM

## 2019-09-03 ENCOUNTER — Other Ambulatory Visit: Payer: Self-pay | Admitting: Family Medicine

## 2019-09-03 DIAGNOSIS — I10 Essential (primary) hypertension: Secondary | ICD-10-CM

## 2019-09-07 ENCOUNTER — Encounter: Payer: Self-pay | Admitting: Family Medicine

## 2019-09-07 ENCOUNTER — Ambulatory Visit (INDEPENDENT_AMBULATORY_CARE_PROVIDER_SITE_OTHER): Payer: Self-pay | Admitting: Family Medicine

## 2019-09-07 ENCOUNTER — Other Ambulatory Visit: Payer: Self-pay

## 2019-09-07 VITALS — BP 139/68 | HR 90 | Temp 98.3°F | Ht 64.0 in | Wt 247.2 lb

## 2019-09-07 DIAGNOSIS — I10 Essential (primary) hypertension: Secondary | ICD-10-CM

## 2019-09-07 DIAGNOSIS — E559 Vitamin D deficiency, unspecified: Secondary | ICD-10-CM

## 2019-09-07 DIAGNOSIS — E66813 Obesity, class 3: Secondary | ICD-10-CM

## 2019-09-07 DIAGNOSIS — R7309 Other abnormal glucose: Secondary | ICD-10-CM

## 2019-09-07 DIAGNOSIS — Z131 Encounter for screening for diabetes mellitus: Secondary | ICD-10-CM

## 2019-09-07 DIAGNOSIS — Z6841 Body Mass Index (BMI) 40.0 and over, adult: Secondary | ICD-10-CM

## 2019-09-07 DIAGNOSIS — Z09 Encounter for follow-up examination after completed treatment for conditions other than malignant neoplasm: Secondary | ICD-10-CM

## 2019-09-07 DIAGNOSIS — E1169 Type 2 diabetes mellitus with other specified complication: Secondary | ICD-10-CM

## 2019-09-07 LAB — POCT URINALYSIS DIPSTICK
Bilirubin, UA: NEGATIVE
Glucose, UA: POSITIVE — AB
Ketones, UA: NEGATIVE
Nitrite, UA: NEGATIVE
Protein, UA: POSITIVE — AB
Spec Grav, UA: >=1.03 — AB (ref 1.010–1.025)
Urobilinogen, UA: 1 E.U./dL
pH, UA: 6 (ref 5.0–8.0)

## 2019-09-07 LAB — GLUCOSE, POCT (MANUAL RESULT ENTRY): POC Glucose: 201 mg/dl — AB (ref 70–99)

## 2019-09-07 MED ORDER — HYDROCHLOROTHIAZIDE 25 MG PO TABS: 25.0000 mg | ORAL_TABLET | Freq: Every day | ORAL | 1 refills | Status: DC

## 2019-09-07 MED ORDER — GLIPIZIDE 10 MG PO TABS: ORAL_TABLET | ORAL | 1 refills | Status: DC

## 2019-09-07 NOTE — Progress Notes (Signed)
Patient [facility]    Established Patient Office Visit  Subjective:  Patient ID: Tiffany Le, female    DOB: March 21, 1976  Age: 44 y.o. MRN: [id number]  CC:  Chief Complaint  Patient presents with  . Follow-up    2 MTH Follow up DM    HPI Tiffany Le is a 44 year old female who presents for Follow Up today.   Past Medical History:  Diagnosis Date  . Diabetes mellitus without complication ([place])   . Hyperlipidemia age 52  . Hypertension age 64  . Iron deficiency anemia   . Obesity    Current Status: Since her last office visit, she is doing well with no complaints. She denies visual changes, chest pain, cough, shortness of breath, heart palpitations, and falls. She has occasional headaches and dizziness with position changes. Denies severe headaches, confusion, seizures, double vision, and blurred vision, nausea and vomiting. she has not been monitoring her blood glucose levels regularly. She denies fatigue, frequent urination, blurred vision, excessive hunger, excessive thirst, weight gain, weight loss, and poor wound healing. She continues to check her feet regularly. She denies fevers, chills, recent infections, weight loss, and night sweats. No reports of GI problems such as diarrhea, and constipation. She has no reports of blood in stools, dysuria and hematuria. She recently had an unexpected death in the family. No depression or anxiety reported today. She denies suicidal ideations, homicidal ideations, or auditory hallucinations. She denies pain today.   Past Surgical History:  Procedure Laterality Date  . CESAREAN SECTION  3/96  . ORIF FEMUR FRACTURE      Family History  Problem Relation Age of Onset  . Heart disease Mother        CABG <50  . Hypertension Mother   . Diabetes Mother   . Hyperlipidemia Mother   . Heart disease Father   . Hyperlipidemia Father   . Hypertension Father   . Early death Father   . Healthy  Daughter   . Diabetes Maternal Aunt   . Cancer Maternal Grandmother        stomach cancer  . Diabetes Maternal Aunt   . Cancer Cousin        colon cancer (dx'd 84)    Social History   Socioeconomic History  . Marital status: Single    Spouse name: Not on file  . Number of children: Not on file  . Years of education: Not on file  . Highest education level: Not on file  Occupational History  . Not on file  Tobacco Use  . Smoking status: Never Smoker  . Smokeless tobacco: Never Used  Substance and Sexual Activity  . Alcohol use: No  . Drug use: No  . Sexual activity: Not Currently    Partners: Male    Comment: husband currently in [place], returning 02/2013  Other Topics Concern  . 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  Lives with 56 year old daughter, 1 cat Husband is living in [place] (citizen there), trying to come to [place] (has been delayed)   Social Determinants of Radio broadcast assistant Strain:   . Difficulty of Paying Living Expenses: Not on file  Food Insecurity:   . Worried About Charity fundraiser in the Last Year: Not on file  . Ran Out of Food in the Last Year: Not on file  Transportation Needs:   . Film/video editor (Medical): Not on file  . Lack of Transportation (Non-Medical): Not on file  Physical Activity:   . Days of Exercise per Week: Not on file  . Minutes of Exercise per Session: Not on file  Stress:   . Feeling of Stress : Not on file  Social Connections:   . Frequency of Communication with Friends and Family: Not on file  . Frequency of Social Gatherings with Friends and Family: Not on file  . Attends Religious Services: Not on file  . Active Member of Clubs or Organizations: Not on file  . Attends Archivist Meetings: Not on file  . Marital Status: Not on file  Intimate Partner Violence:   . Fear of Current or Ex-Partner: Not on file  . Emotionally  Abused: Not on file  . Physically Abused: Not on file  . Sexually Abused: Not on file    Outpatient Medications Prior to Visit  Medication Sig Dispense Refill  . albuterol (PROVENTIL HFA) 108 (90 Base) MCG/ACT inhaler Inhale 2 puffs into the lungs every 6 (six) hours as needed for wheezing. 1 Inhaler 6  . amLODipine (NORVASC) 5 MG tablet Take 2 tablets (Total= 10 mg), by mouth daily. 60 tablet 3  . aspirin EC 81 MG tablet Take 81 mg by mouth daily.    Marland Kitchen atorvastatin (LIPITOR) 10 MG tablet TAKE 1 TABLET BY MOUTH EVERY DAY 90 tablet 1  . cyclobenzaprine (FLEXERIL) 10 MG tablet Take 1 tablet (10 mg total) by mouth 2 (two) times daily as needed for muscle spasms. 30 tablet 3  . ergocalciferol (VITAMIN D2) 1.25 MG (50000 UT) capsule Take 1 capsule (50,000 Units total) by mouth once a week. 5 capsule 3  . metFORMIN (GLUCOPHAGE) 500 MG tablet Take 1 tablet (500 mg total) by mouth 2 (two) times daily with a meal. 180 tablet 3  . glipiZIDE (GLUCOTROL) 10 MG tablet TAKE 1 TABLET BY MOUTH TWICE DAILY BEFORE A MEAL 60 tablet 0  . hydrochlorothiazide (HYDRODIURIL) 25 MG tablet Take 1 tablet by mouth once daily 30 tablet 0  . HYDROcodone-homatropine (HYCODAN) 5-1.5 MG/5ML syrup Take 5 mLs by mouth every 8 (eight) hours as needed for cough. (Patient not taking: Reported on 09/07/2019) 120 mL 0   No facility-administered medications prior to visit.    Allergies  Allergen Reactions  . Penicillins Other (See Comments)    Historical Has patient had a PCN reaction causing immediate rash, facial/tongue/throat swelling, SOB or lightheadedness with hypotension: No Has patient had a PCN reaction causing severe rash involving mucus membranes or skin necrosis: No Has patient had a PCN reaction that required hospitalization: No Has patient had a PCN reaction occurring within the last 10 years: No If all of the above answers are "NO", then may proceed with Cephalosporin use.    ROS Review of Systems   Constitutional: Negative.   HENT: Negative.   Eyes: Negative.   Respiratory: Negative.   Cardiovascular: Negative.   Gastrointestinal: Positive for abdominal distention.  Endocrine: Negative.   Genitourinary: Negative.   Musculoskeletal: Negative.   Skin: Negative.   Allergic/Immunologic: Negative.   Neurological: Positive for dizziness (occasional ) and headaches (occasional ).  Hematological: Negative.   Psychiatric/Behavioral: Negative.       Objective:    Physical Exam  Constitutional: She is oriented to person, place, and time. She appears well-developed and well-nourished.  HENT:  Head: Normocephalic and atraumatic.  Eyes: Conjunctivae are normal.  Cardiovascular:  Normal rate, regular rhythm, normal heart sounds and intact distal pulses.  Pulmonary/Chest: Effort normal and breath sounds normal.  Abdominal: Soft. Bowel sounds are normal. She exhibits distension (obese).  Musculoskeletal:        General: Normal range of motion.     Cervical back: Normal range of motion and neck supple.  Neurological: She is alert and oriented to person, place, and time. She has normal reflexes.  Skin: Skin is warm and dry.  Psychiatric: She has a normal mood and affect. Her behavior is normal. Judgment and thought content normal.  Nursing note and vitals reviewed.   BP 139/68   Pulse 90   Temp 98.3 F (36.8 C) (Oral)   Ht 5\' 4"  (1.626 m)   Wt 247 lb 3.2 oz (112.1 kg)   LMP 08/28/2019   SpO2 100%   BMI 42.43 kg/m  Wt Readings from Last 3 Encounters:  09/07/19 247 lb 3.2 oz (112.1 kg)  10/19/18 236 lb (107 kg)  01/13/16 261 lb 9.6 oz (118.7 kg)     Health Maintenance Due  Topic Date Due  . PNEUMOCOCCAL POLYSACCHARIDE VACCINE AGE 28-64 HIGH RISK  04/25/1978  . OPHTHALMOLOGY EXAM  04/25/1986  . HIV Screening  04/26/1991  . PAP SMEAR-Modifier  04/25/1997  . URINE MICROALBUMIN  11/04/2014  . FOOT EXAM  01/13/2015  . INFLUENZA VACCINE  03/12/2019    There are no  preventive care reminders to display for this patient.  Lab Results  Component Value Date   TSH 2.030 10/19/2018   Lab Results  Component Value Date   WBC 9.4 10/19/2018   HGB 12.9 10/19/2018   HCT 39.6 10/19/2018   MCV 83 10/19/2018   PLT 318 10/19/2018   Lab Results  Component Value Date   NA 136 10/19/2018   K 3.7 10/19/2018   CO2 21 10/19/2018   GLUCOSE 321 (H) 10/19/2018   BUN 11 10/19/2018   CREATININE 0.77 10/19/2018   BILITOT 0.2 10/19/2018   ALKPHOS 103 10/19/2018   AST 39 10/19/2018   ALT 63 (H) 10/19/2018   PROT 7.8 10/19/2018   ALBUMIN 4.4 10/19/2018   CALCIUM 9.3 10/19/2018   ANIONGAP 14 12/26/2017   Lab Results  Component Value Date   CHOL 248 (H) 10/19/2018   Lab Results  Component Value Date   HDL 39 (L) 10/19/2018   Lab Results  Component Value Date   LDLCALC 190 (H) 10/19/2018   Lab Results  Component Value Date   TRIG 96 10/19/2018   Lab Results  Component Value Date   CHOLHDL 6.4 (H) 10/19/2018   Lab Results  Component Value Date   HGBA1C 10.4 (A) 07/20/2019      Assessment & Plan:   1. Type 2 diabetes mellitus with other specified complication, without long-term current use of insulin (HCC) Blood glucose increased at 242 today. He will continue medication as prescribed, to decrease foods/beverages high in sugars and carbs and follow Heart Healthy or DASH diet. Increase physical activity to at least 30 minutes cardio exercise daily.  - POCT urinalysis dipstick - POCT glucose (manual entry)  2. Hemoglobin A1C greater than 9%, indicating poor diabetic control Improved. Hgb A1c decreased at 10.4 today, from 11.7 on 10/19/2018. Monitor.   3. Essential hypertension, benign The current medical regimen is effective; blood pressure is stable at 139/68 today; continue present plan and medications as prescribed. She will continue to take medications as prescribed, to decrease high sodium intake, excessive alcohol intake, increase potassium  intake, smoking cessation, and increase physical activity of at least 30 minutes of cardio activity daily. She will continue to follow Heart Healthy or DASH diet. - hydrochlorothiazide (HYDRODIURIL) 25 MG tablet; Take 1 tablet (25 mg total) by mouth daily.  Dispense: 90 tablet; Refill: 1  4. Class 3 severe obesity due to excess calories with serious comorbidity and body mass index (BMI) of 40.0 to 44.9 in adult [facility]) Body mass index is 42.43 kg/m. Goal BMI  is <30. Encouraged efforts to reduce weight include engaging in physical activity as tolerated with goal of 150 minutes per week. Improve dietary choices and eat a meal regimen consistent with a Mediterranean or DASH diet. Reduce simple carbohydrates. Do not skip meals and eat healthy snacks throughout the day to avoid over-eating at dinner. Set a goal weight loss that is achievable for you.  5. Vitamin D deficiency  6. Screening for diabetes mellitus - glipiZIDE (GLUCOTROL) 10 MG tablet; TAKE 1 TABLET BY MOUTH TWICE DAILY BEFORE A MEAL  Dispense: 180 tablet; Refill: 1  7. Follow up She will follow up in 2 months.    Meds ordered this encounter  Medications  . hydrochlorothiazide (HYDRODIURIL) 25 MG tablet    Sig: Take 1 tablet (25 mg total) by mouth daily.    Dispense:  90 tablet    Refill:  1  . glipiZIDE (GLUCOTROL) 10 MG tablet    Sig: TAKE 1 TABLET BY MOUTH TWICE DAILY BEFORE A MEAL    Dispense:  180 tablet    Refill:  1    Orders Placed This Encounter  Procedures  . POCT urinalysis dipstick  . POCT glucose (manual entry)    Referral Orders  No referral(s) requested today    Kathe Becton,  MSN, FNP-BC [address], [place] [address] [phone] fax   Problem List Items Addressed This Visit      Cardiovascular and Mediastinum   Essential hypertension, benign   Relevant Medications   hydrochlorothiazide  (HYDRODIURIL) 25 MG tablet     Other   Class 3 severe obesity due to excess calories with serious comorbidity and body mass index (BMI) of 40.0 to 44.9 in adult [facility])   Relevant Medications   glipiZIDE (GLUCOTROL) 10 MG tablet    Other Visit Diagnoses    Type 2 diabetes mellitus with other specified complication, without long-term current use of insulin (HCC)    -  Primary   Relevant Medications   glipiZIDE (GLUCOTROL) 10 MG tablet   Other Relevant Orders   POCT urinalysis dipstick (Completed)   POCT glucose (manual entry) (Completed)   Hemoglobin A1C greater than 9%, indicating poor diabetic control       Relevant Medications   glipiZIDE (GLUCOTROL) 10 MG tablet   Vitamin D deficiency       Screening for diabetes mellitus       Relevant Medications   glipiZIDE (GLUCOTROL) 10 MG tablet   Follow up          Meds ordered this encounter  Medications  . hydrochlorothiazide (HYDRODIURIL) 25 MG tablet    Sig: Take 1 tablet (25 mg total) by mouth daily.    Dispense:  90 tablet    Refill:  1  . glipiZIDE (GLUCOTROL) 10 MG tablet    Sig: TAKE 1 TABLET BY MOUTH TWICE DAILY BEFORE A MEAL    Dispense:  180 tablet    Refill:  1    Follow-up: Return in about 2 months (around 11/05/2019).    Azzie Glatter, FNP

## 2019-11-04 ENCOUNTER — Ambulatory Visit: Payer: Self-pay | Admitting: Family Medicine

## 2019-11-10 DIAGNOSIS — E876 Hypokalemia: Secondary | ICD-10-CM

## 2019-11-10 HISTORY — DX: Hypokalemia: E87.6

## 2019-11-10 HISTORY — DX: Hypocalcemia: E83.51

## 2019-12-01 ENCOUNTER — Encounter (HOSPITAL_COMMUNITY): Payer: Self-pay | Admitting: *Deleted

## 2019-12-01 ENCOUNTER — Other Ambulatory Visit: Payer: Self-pay

## 2019-12-01 ENCOUNTER — Inpatient Hospital Stay (HOSPITAL_COMMUNITY)
Admission: EM | Admit: 2019-12-01 | Discharge: 2019-12-04 | DRG: 872 | Disposition: A | Discharge status: Home / Self Care | Payer: Self-pay | Attending: Internal Medicine | Admitting: Internal Medicine

## 2019-12-01 DIAGNOSIS — Z79899 Other long term (current) drug therapy: Secondary | ICD-10-CM

## 2019-12-01 DIAGNOSIS — R7989 Other specified abnormal findings of blood chemistry: Secondary | ICD-10-CM | POA: Diagnosis present

## 2019-12-01 DIAGNOSIS — N390 Urinary tract infection, site not specified: Secondary | ICD-10-CM | POA: Diagnosis present

## 2019-12-01 DIAGNOSIS — Z8249 Family history of ischemic heart disease and other diseases of the circulatory system: Secondary | ICD-10-CM

## 2019-12-01 DIAGNOSIS — A419 Sepsis, unspecified organism: Principal | ICD-10-CM | POA: Diagnosis present

## 2019-12-01 DIAGNOSIS — E785 Hyperlipidemia, unspecified: Secondary | ICD-10-CM | POA: Diagnosis present

## 2019-12-01 DIAGNOSIS — N12 Tubulo-interstitial nephritis, not specified as acute or chronic: Secondary | ICD-10-CM

## 2019-12-01 DIAGNOSIS — Z20822 Contact with and (suspected) exposure to covid-19: Secondary | ICD-10-CM | POA: Diagnosis present

## 2019-12-01 DIAGNOSIS — I10 Essential (primary) hypertension: Secondary | ICD-10-CM | POA: Diagnosis present

## 2019-12-01 DIAGNOSIS — Z880 Allergy status to penicillin: Secondary | ICD-10-CM

## 2019-12-01 DIAGNOSIS — N136 Pyonephrosis: Secondary | ICD-10-CM | POA: Diagnosis present

## 2019-12-01 DIAGNOSIS — E875 Hyperkalemia: Secondary | ICD-10-CM | POA: Diagnosis present

## 2019-12-01 DIAGNOSIS — E7800 Pure hypercholesterolemia, unspecified: Secondary | ICD-10-CM | POA: Diagnosis present

## 2019-12-01 DIAGNOSIS — Z83438 Family history of other disorder of lipoprotein metabolism and other lipidemia: Secondary | ICD-10-CM

## 2019-12-01 DIAGNOSIS — Z7982 Long term (current) use of aspirin: Secondary | ICD-10-CM

## 2019-12-01 DIAGNOSIS — Z6841 Body Mass Index (BMI) 40.0 and over, adult: Secondary | ICD-10-CM

## 2019-12-01 DIAGNOSIS — E1165 Type 2 diabetes mellitus with hyperglycemia: Secondary | ICD-10-CM | POA: Diagnosis present

## 2019-12-01 DIAGNOSIS — Z131 Encounter for screening for diabetes mellitus: Secondary | ICD-10-CM

## 2019-12-01 DIAGNOSIS — Z833 Family history of diabetes mellitus: Secondary | ICD-10-CM

## 2019-12-01 DIAGNOSIS — Z7984 Long term (current) use of oral hypoglycemic drugs: Secondary | ICD-10-CM

## 2019-12-01 LAB — URINALYSIS, ROUTINE W REFLEX MICROSCOPIC
Bacteria, UA: NONE SEEN
Bilirubin Urine: NEGATIVE
Glucose, UA: >=500 mg/dL — AB
Ketones, ur: 5 mg/dL — AB
Nitrite: NEGATIVE
Protein, ur: 100 mg/dL — AB
RBC / HPF: >50 RBC/hpf — ABNORMAL HIGH (ref 0–5)
Specific Gravity, Urine: 1.018 (ref 1.005–1.030)
pH: 5 (ref 5.0–8.0)

## 2019-12-01 LAB — CBG MONITORING, ED: Glucose-Capillary: 234 mg/dL — ABNORMAL HIGH (ref 70–99)

## 2019-12-01 NOTE — ED Triage Notes (Signed)
Right sided flank/back pain with urinary urgency and frequency. Denies fevers

## 2019-12-02 ENCOUNTER — Encounter (HOSPITAL_COMMUNITY): Payer: Self-pay | Admitting: Internal Medicine

## 2019-12-02 ENCOUNTER — Emergency Department (HOSPITAL_COMMUNITY): Payer: Self-pay

## 2019-12-02 ENCOUNTER — Observation Stay (HOSPITAL_COMMUNITY): Payer: Self-pay

## 2019-12-02 DIAGNOSIS — A419 Sepsis, unspecified organism: Secondary | ICD-10-CM | POA: Diagnosis present

## 2019-12-02 LAB — LACTIC ACID, PLASMA
Lactic Acid, Venous: 2.1 mmol/L (ref 0.5–1.9)
Lactic Acid, Venous: 1.5 mmol/L (ref 0.5–1.9)

## 2019-12-02 LAB — POC SARS CORONAVIRUS 2 AG -  ED: SARS Coronavirus 2 Ag: NEGATIVE

## 2019-12-02 LAB — CBC
HCT: 40.6 % (ref 36.0–46.0)
Hemoglobin: 13 g/dL (ref 12.0–15.0)
MCH: 27.1 pg (ref 26.0–34.0)
MCHC: 32 g/dL (ref 30.0–36.0)
MCV: 84.8 fL (ref 80.0–100.0)
Platelets: 326 10*3/uL (ref 150–400)
RBC: 4.79 MIL/uL (ref 3.87–5.11)
RDW: 12.6 % (ref 11.5–15.5)
WBC: 14 10*3/uL — ABNORMAL HIGH (ref 4.0–10.5)
nRBC: 0 % (ref 0.0–0.2)

## 2019-12-02 LAB — CBG MONITORING, ED
Glucose-Capillary: 248 mg/dL — ABNORMAL HIGH (ref 70–99)
Glucose-Capillary: 259 mg/dL — ABNORMAL HIGH (ref 70–99)

## 2019-12-02 LAB — COMPREHENSIVE METABOLIC PANEL WITH GFR
ALT: 50 U/L — ABNORMAL HIGH (ref 0–44)
AST: 28 U/L (ref 15–41)
Albumin: 3.5 g/dL (ref 3.5–5.0)
Alkaline Phosphatase: 79 U/L (ref 38–126)
Anion gap: 15 (ref 5–15)
BUN: 17 mg/dL (ref 6–20)
CO2: 22 mmol/L (ref 22–32)
Calcium: 9.1 mg/dL (ref 8.9–10.3)
Chloride: 95 mmol/L — ABNORMAL LOW (ref 98–111)
Creatinine, Ser: 0.81 mg/dL (ref 0.44–1.00)
GFR calc Af Amer: >60 mL/min
GFR calc non Af Amer: >60 mL/min
Glucose, Bld: 299 mg/dL — ABNORMAL HIGH (ref 70–99)
Potassium: 3.8 mmol/L (ref 3.5–5.1)
Sodium: 132 mmol/L — ABNORMAL LOW (ref 135–145)
Total Bilirubin: 0.6 mg/dL (ref 0.3–1.2)
Total Protein: 8.4 g/dL — ABNORMAL HIGH (ref 6.5–8.1)

## 2019-12-02 LAB — PROCALCITONIN: Procalcitonin: <0.1 ng/mL

## 2019-12-02 LAB — HEMOGLOBIN A1C
Hgb A1c MFr Bld: 11.8 % — ABNORMAL HIGH (ref 4.8–5.6)
Mean Plasma Glucose: 291.96 mg/dL

## 2019-12-02 LAB — PROTIME-INR
INR: 1 (ref 0.8–1.2)
Prothrombin Time: 13.3 s (ref 11.4–15.2)

## 2019-12-02 LAB — I-STAT BETA HCG BLOOD, ED (MC, WL, AP ONLY): I-stat hCG, quantitative: <5 m[IU]/mL (ref <5)

## 2019-12-02 LAB — APTT: aPTT: 28 s (ref 24–36)

## 2019-12-02 LAB — GLUCOSE, CAPILLARY: Glucose-Capillary: 234 mg/dL — ABNORMAL HIGH (ref 70–99)

## 2019-12-02 LAB — SARS CORONAVIRUS 2 (TAT 6-24 HRS): SARS Coronavirus 2: NEGATIVE

## 2019-12-02 LAB — HIV ANTIBODY (ROUTINE TESTING W REFLEX): HIV Screen 4th Generation wRfx: NONREACTIVE

## 2019-12-02 MED ORDER — ASPIRIN EC 81 MG PO TBEC
81.0000 mg | DELAYED_RELEASE_TABLET | Freq: Every day | ORAL | Status: DC
  Administered 2019-12-02 – 2019-12-04 (×3): 81 mg via ORAL
  Filled 2019-12-02 (×3): qty 1

## 2019-12-02 MED ORDER — ACETAMINOPHEN 650 MG RE SUPP: 650.0000 mg | Freq: Four times a day (QID) | RECTAL | Status: DC | PRN

## 2019-12-02 MED ORDER — ZOLPIDEM TARTRATE 5 MG PO TABS: 5.0000 mg | ORAL_TABLET | Freq: Every evening | ORAL | Status: DC | PRN

## 2019-12-02 MED ORDER — MORPHINE SULFATE (PF) 2 MG/ML IV SOLN: 2.0000 mg | INTRAVENOUS | Status: DC | PRN

## 2019-12-02 MED ORDER — CYCLOBENZAPRINE HCL 10 MG PO TABS: 10.0000 mg | ORAL_TABLET | Freq: Two times a day (BID) | ORAL | Status: DC | PRN

## 2019-12-02 MED ORDER — ATORVASTATIN CALCIUM 10 MG PO TABS
10.0000 mg | ORAL_TABLET | Freq: Every day | ORAL | Status: DC
  Administered 2019-12-02 – 2019-12-04 (×3): 10 mg via ORAL
  Filled 2019-12-02 (×3): qty 1

## 2019-12-02 MED ORDER — BISACODYL 5 MG PO TBEC: 5.0000 mg | DELAYED_RELEASE_TABLET | Freq: Every day | ORAL | Status: DC | PRN

## 2019-12-02 MED ORDER — HYDROCODONE-ACETAMINOPHEN 5-325 MG PO TABS: 1.0000 | ORAL_TABLET | ORAL | Status: DC | PRN

## 2019-12-02 MED ORDER — SODIUM CHLORIDE 0.9 % IV SOLN
1000.0000 mL | INTRAVENOUS | Status: DC
  Administered 2019-12-02: 1000 mL via INTRAVENOUS

## 2019-12-02 MED ORDER — IOHEXOL 300 MG/ML  SOLN
100.0000 mL | Freq: Once | INTRAMUSCULAR | Status: AC | PRN
  Administered 2019-12-02: 100 mL via INTRAVENOUS

## 2019-12-02 MED ORDER — ACETAMINOPHEN 325 MG PO TABS
650.0000 mg | ORAL_TABLET | Freq: Four times a day (QID) | ORAL | Status: DC | PRN
  Administered 2019-12-02: 650 mg via ORAL
  Filled 2019-12-02: qty 2

## 2019-12-02 MED ORDER — ALBUTEROL SULFATE (2.5 MG/3ML) 0.083% IN NEBU: 3.0000 mL | INHALATION_SOLUTION | Freq: Four times a day (QID) | RESPIRATORY_TRACT | Status: DC | PRN

## 2019-12-02 MED ORDER — HYDROMORPHONE HCL 1 MG/ML IJ SOLN
1.0000 mg | Freq: Once | INTRAMUSCULAR | Status: AC
  Administered 2019-12-02: 1 mg via INTRAVENOUS
  Filled 2019-12-02: qty 1

## 2019-12-02 MED ORDER — SODIUM CHLORIDE 0.9% FLUSH
3.0000 mL | Freq: Two times a day (BID) | INTRAVENOUS | Status: DC
  Administered 2019-12-02 – 2019-12-04 (×5): 3 mL via INTRAVENOUS

## 2019-12-02 MED ORDER — DOCUSATE SODIUM 100 MG PO CAPS
100.0000 mg | ORAL_CAPSULE | Freq: Two times a day (BID) | ORAL | Status: DC
  Administered 2019-12-02 – 2019-12-04 (×4): 100 mg via ORAL
  Filled 2019-12-02 (×4): qty 1

## 2019-12-02 MED ORDER — INSULIN ASPART 100 UNIT/ML ~~LOC~~ SOLN
0.0000 [IU] | Freq: Three times a day (TID) | SUBCUTANEOUS | Status: DC
  Administered 2019-12-02: 8 [IU] via SUBCUTANEOUS
  Administered 2019-12-02 – 2019-12-03 (×2): 5 [IU] via SUBCUTANEOUS
  Administered 2019-12-03: 8 [IU] via SUBCUTANEOUS
  Administered 2019-12-03: 5 [IU] via SUBCUTANEOUS
  Administered 2019-12-04: 3 [IU] via SUBCUTANEOUS
  Administered 2019-12-04: 8 [IU] via SUBCUTANEOUS

## 2019-12-02 MED ORDER — HYDRALAZINE HCL 20 MG/ML IJ SOLN: 5.0000 mg | INTRAMUSCULAR | Status: DC | PRN

## 2019-12-02 MED ORDER — ONDANSETRON HCL 4 MG/2ML IJ SOLN: 4.0000 mg | Freq: Four times a day (QID) | INTRAMUSCULAR | Status: DC | PRN

## 2019-12-02 MED ORDER — SODIUM CHLORIDE 0.9 % IV SOLN
1.0000 g | INTRAVENOUS | Status: DC
  Administered 2019-12-02 – 2019-12-04 (×3): 1 g via INTRAVENOUS
  Filled 2019-12-02: qty 1
  Filled 2019-12-02: qty 10
  Filled 2019-12-02: qty 1
  Filled 2019-12-02: qty 10

## 2019-12-02 MED ORDER — LACTATED RINGERS IV SOLN: INTRAVENOUS | Status: DC

## 2019-12-02 MED ORDER — POLYETHYLENE GLYCOL 3350 17 G PO PACK: 17.0000 g | PACK | Freq: Every day | ORAL | Status: DC | PRN

## 2019-12-02 MED ORDER — AMLODIPINE BESYLATE 10 MG PO TABS
10.0000 mg | ORAL_TABLET | Freq: Every day | ORAL | Status: DC
  Administered 2019-12-02 – 2019-12-04 (×3): 10 mg via ORAL
  Filled 2019-12-02: qty 1
  Filled 2019-12-02 (×2): qty 2

## 2019-12-02 MED ORDER — HYDROCHLOROTHIAZIDE 25 MG PO TABS
25.0000 mg | ORAL_TABLET | Freq: Every day | ORAL | Status: DC
  Administered 2019-12-02 – 2019-12-04 (×3): 25 mg via ORAL
  Filled 2019-12-02 (×3): qty 1

## 2019-12-02 MED ORDER — ACETAMINOPHEN 325 MG PO TABS: 650.0000 mg | ORAL_TABLET | Freq: Four times a day (QID) | ORAL | Status: DC | PRN

## 2019-12-02 MED ORDER — METOCLOPRAMIDE HCL 5 MG/ML IJ SOLN
10.0000 mg | Freq: Once | INTRAMUSCULAR | Status: AC
  Administered 2019-12-02: 10 mg via INTRAVENOUS
  Filled 2019-12-02: qty 2

## 2019-12-02 MED ORDER — INSULIN ASPART 100 UNIT/ML ~~LOC~~ SOLN
0.0000 [IU] | Freq: Every day | SUBCUTANEOUS | Status: DC
  Administered 2019-12-02: 2 [IU] via SUBCUTANEOUS
  Administered 2019-12-03: 3 [IU] via SUBCUTANEOUS

## 2019-12-02 MED ORDER — ENOXAPARIN SODIUM 40 MG/0.4ML ~~LOC~~ SOLN
40.0000 mg | SUBCUTANEOUS | Status: DC
  Filled 2019-12-02 (×2): qty 0.4

## 2019-12-02 MED ORDER — ONDANSETRON HCL 4 MG PO TABS: 4.0000 mg | ORAL_TABLET | Freq: Four times a day (QID) | ORAL | Status: DC | PRN

## 2019-12-02 MED ORDER — LIVING WELL WITH DIABETES BOOK
Freq: Once | Status: AC
  Filled 2019-12-02: qty 1

## 2019-12-02 NOTE — Progress Notes (Signed)
To CT for scan.

## 2019-12-02 NOTE — ED Provider Notes (Signed)
[facility] EMERGENCY DEPARTMENT Provider Note   CSN: [id number] Arrival date & time: 12/01/19  2057     History Chief Complaint  Patient presents with  . Flank Pain    Tiffany Le is a 44 y.o. female who presents emergency department with a chief complaint of flank pain.  She has a past medical history of diabetes, hyperlipidemia, hypertension, obesity and iron deficiency anemia.  Patient states that 3 days ago she slipped while she was in her kitchen.  She was able to catch herself before she fell but began having right-sided flank pain and spasms.  She also noticed that she was having increased urinary frequency.  Notably, she has been off of her Metformin for the past month because she had a normal hemoglobin A1c at her last visit.  The patient states that yesterday around midday she began to have a persistent achiness in her right flank.  Combined with the urinary symptoms she thought she might have a urinary tract infection.  She did notice a darkening color but denies any foul odor, dysuria.  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  She has not been vaccinated for Covid.  She denies cough.  She has 10 out of 10 right flank pain, fatigue, body aches.  She had multiple episodes of retching without vomit.  She has not been able to eat in the last 2 days.  She denies known Covid exposure.  HPI     Past Medical History:  Diagnosis Date  . Diabetes mellitus without complication ([place])   . Hyperlipidemia age 4  . Hypertension age 7  . Iron deficiency anemia   . Obesity     Patient Active Problem List   Diagnosis Date Noted  . Sepsis secondary to UTI ([place]) 12/02/2019  . Class 3 severe obesity due to excess calories with serious comorbidity and body mass index (BMI) of 40.0 to 44.9 in adult ([place]) 10/24/2018  . Back pain 10/24/2018  . Elevated LFTs 01/13/2014  . Unspecified vitamin D deficiency 01/13/2014  . Uncontrolled  type 2 diabetes mellitus with hyperglycemia, without long-term current use of insulin ([place]) 11/03/2013  . Anemia, iron deficiency 11/17/2012  . Essential hypertension, benign 11/17/2012  . Pure hypercholesterolemia 11/17/2012    Past Surgical History:  Procedure Laterality Date  . CESAREAN SECTION  3/96  . ORIF FEMUR FRACTURE       OB History    Gravida  1   Para  1   Term      Preterm      AB      Living  1     SAB      TAB      Ectopic      Multiple      Live Births              Family History  Problem Relation Age of Onset  . Heart disease Mother        CABG <50  . Hypertension Mother   . Diabetes Mother   . Hyperlipidemia Mother   . Heart disease Father   . Hyperlipidemia Father   . Hypertension Father   . Early death Father   . Healthy Daughter   . Diabetes Maternal Aunt   . Cancer Maternal Grandmother        stomach cancer  . Diabetes Maternal Aunt   . Cancer Cousin  colon cancer (dx'd 72)    Social History   Tobacco Use  . Smoking status: Never Smoker  . Smokeless tobacco: Never Used  Substance Use Topics  . Alcohol use: No  . Drug use: No    Home Medications Prior to Admission medications   Medication Sig Start Date End Date Taking? Authorizing Provider  amLODipine (NORVASC) 5 MG tablet Take 2 tablets (Total= 10 mg), by mouth daily. 07/20/19  Yes Azzie Glatter, FNP  aspirin EC 81 MG tablet Take 81 mg by mouth daily.   Yes [provider]  atorvastatin (LIPITOR) 10 MG tablet TAKE 1 TABLET BY MOUTH EVERY DAY 06/09/19  Yes Azzie Glatter, FNP  cyclobenzaprine (FLEXERIL) 10 MG tablet Take 1 tablet (10 mg total) by mouth 2 (two) times daily as needed for muscle spasms. 07/20/19  Yes Azzie Glatter, FNP  glipiZIDE (GLUCOTROL) 10 MG tablet TAKE 1 TABLET BY MOUTH TWICE DAILY BEFORE A MEAL 09/07/19  Yes Azzie Glatter, FNP  hydrochlorothiazide (HYDRODIURIL) 25 MG tablet Take 1 tablet (25 mg total) by mouth daily.  09/07/19  Yes Azzie Glatter, FNP  albuterol (PROVENTIL HFA) 108 (90 Base) MCG/ACT inhaler Inhale 2 puffs into the lungs every 6 (six) hours as needed for wheezing. 10/19/18   Azzie Glatter, FNP    Allergies    Penicillins  Review of Systems   Review of Systems Ten systems reviewed and are negative for acute change, except as noted in the HPI.   Physical Exam Updated Vital Signs BP (!) 156/83 (BP Location: Left Arm)   Pulse (!) 120   Temp 99.7 F (37.6 C)   Resp 20   Ht 5\' 4"  (1.626 m)   Wt 111.4 kg   LMP 11/10/2019   SpO2 100%   BMI 42.16 kg/m   Physical Exam Physical Exam  Nursing note and vitals reviewed. Constitutional: She is oriented to person, place, and time. She appears well-developed and well-nourished. No distress.  HENT:  Head: Normocephalic and atraumatic.  Eyes: Conjunctivae normal and EOM are normal. Pupils are equal, round, and reactive to light. No scleral icterus.  Neck: Normal range of motion.  Cardiovascular: Tachycardic regular rhythm and normal heart sounds.  Exam reveals no gallop and no friction rub.   No murmur heard. Pulmonary/Chest: Effort normal and breath sounds normal. No respiratory distress.  Abdominal: Soft. Bowel sounds are normal. She exhibits no distension and no mass.  Mild generalized abdominal pain to palpation..  There is no guarding.  No CVA tenderness bilaterally. No CVA tenderness. Neurological: She is alert and oriented to person, place, and time.  Skin: Hot to touch.   ED Results / Procedures / Treatments   Labs (all labs ordered are listed, but only abnormal results are displayed) Labs Reviewed  URINALYSIS, ROUTINE W REFLEX MICROSCOPIC - Abnormal; Notable for the following components:      Result Value   Color, Urine AMBER (*)    APPearance TURBID (*)    Glucose, UA >=500 (*)    Hgb urine dipstick LARGE (*)    Ketones, ur 5 (*)    Protein, ur 100 (*)    Leukocytes,Ua MODERATE (*)    RBC / HPF >50 (*)    All  other components within normal limits  CBC - Abnormal; Notable for the following components:   WBC 14.0 (*)    All other components within normal limits  COMPREHENSIVE METABOLIC PANEL - Abnormal; Notable for the following components:  Sodium 132 (*)    Chloride 95 (*)    Glucose, Bld 299 (*)    Total Protein 8.4 (*)    ALT 50 (*)    All other components within normal limits  LACTIC ACID, PLASMA - Abnormal; Notable for the following components:   Lactic Acid, Venous 2.1 (*)    All other components within normal limits  HEMOGLOBIN A1C - Abnormal; Notable for the following components:   Hgb A1c MFr Bld 11.8 (*)    All other components within normal limits  CBG MONITORING, ED - Abnormal; Notable for the following components:   Glucose-Capillary 234 (*)    All other components within normal limits  CBG MONITORING, ED - Abnormal; Notable for the following components:   Glucose-Capillary 248 (*)    All other components within normal limits  CBG MONITORING, ED - Abnormal; Notable for the following components:   Glucose-Capillary 259 (*)    All other components within normal limits  CULTURE, BLOOD (ROUTINE X 2)  CULTURE, BLOOD (ROUTINE X 2)  SARS CORONAVIRUS 2 (TAT 6-24 HRS)  URINE CULTURE  APTT  PROTIME-INR  LACTIC ACID, PLASMA  HIV ANTIBODY (ROUTINE TESTING W REFLEX)  PROCALCITONIN  BASIC METABOLIC PANEL  CBC  POC URINE PREG, ED  I-STAT BETA HCG BLOOD, ED ([facility], [facility], [facility] ONLY)  POC SARS CORONAVIRUS 2 AG -  ED    EKG EKG Interpretation  Date/Time:  Friday December 02 2019 06:54:54 EDT Ventricular Rate:  117 PR Interval:    QRS Duration: 78 QT Interval:  342 QTC Calculation: 478 R Axis:   30 Text Interpretation: Sinus tachycardia RSR' in V1 or V2, probably normal variant Borderline repolarization abnormality Confirmed by Addison Lank [id number]) on 12/02/2019 6:59:47 AM   Radiology DG Chest Port 1 View  Result Date: 12/02/2019 CLINICAL DATA:  44 year old female with fever,  flank and back pain. EXAM: PORTABLE CHEST 1 VIEW COMPARISON:  CT Abdomen and Pelvis 12/26/2017. FINDINGS: Portable AP semi upright view at 0728 hours. Lung volumes and mediastinal contours are within normal limits. Allowing for portable technique the lungs are clear. Visualized tracheal air column is within normal limits. No pneumothorax or pleural effusion. Paucity of bowel gas in the upper abdomen. No acute osseous abnormality identified. IMPRESSION: Negative portable chest. Electronically Signed   By: Genevie Ann M.D.   On: 12/02/2019 07:41   CT Renal Stone Study  Result Date: 12/02/2019 CLINICAL DATA:  44 year old female with right flank and lower quadrant pain for 3 days. EXAM: CT ABDOMEN AND PELVIS WITHOUT CONTRAST TECHNIQUE: Multidetector CT imaging of the abdomen and pelvis was performed following the standard protocol without IV contrast. COMPARISON:  CT Abdomen and Pelvis 12/26/2017. FINDINGS: Lower chest: Negative. Hepatobiliary: Negative noncontrast liver and gallbladder. Pancreas: Negative. Spleen: Negative. Adrenals/Urinary Tract: Normal adrenal glands. Negative noncontrast left kidney and ureter. On the right side there is mild hydronephrosis, and evidence of right periureteral stranding. And there are numerous chronic pelvic phleboliths bilaterally. The distal ureter is sometimes difficult to delineate, with no ureteral calculus identified. No calculus identified within the bladder. Unremarkable urinary bladder. No superimposed right nephrolithiasis. Stomach/Bowel: Negative rectum. Redundant but otherwise negative sigmoid colon. Mild retained stool upstream of the sigmoid. Redundant hepatic flexure. Normal appendix visible on coronal image 47. No large bowel inflammation. Negative terminal ileum. No dilated small bowel. Decompressed stomach and duodenum. No free air, free fluid. Vascular/Lymphatic: Vascular patency is not evaluated in the absence of IV contrast. No atherosclerosis identified.  Reproductive: Chronic subserosal  fibroid at the uterine fundus, not significantly changed from 2019, 47 mm diameter. Otherwise negative noncontrast appearance. Other: No pelvic free fluid. Musculoskeletal: No acute osseous abnormality identified. Partially visible chronic right femur ORIF. IMPRESSION: 1. Mild right hydronephrosis and periureteral stranding, but no ureteral calculus or obstructing etiology can be identified. A similar CT appearance was noted in 2019 and 2018, and there are numerous pelvic phleboliths. Main differential considerations are recently passed stone, occult obstructing stone, ureteral stricture, and ascending UTI. If pyelonephritis is suspected CT Abdomen and Pelvis with IV contrast would be valuable. Otherwise a retrograde pyelogram may be valuable. 2. Normal appendix. No other acute or inflammatory process identified in noncontrast abdomen and pelvis. 3. Chronic subserosal uterine fundal fibroid. Electronically Signed   By: Genevie Ann M.D.   On: 12/02/2019 09:55    Procedures .Critical Care Performed by: Margarita Mail, PA-C Authorized by: Margarita Mail, PA-C   Critical care provider statement:    Critical care time (minutes):  65   Critical care time was exclusive of:  Separately billable procedures and treating other patients   Critical care was necessary to treat or prevent imminent or life-threatening deterioration of the following conditions:  Sepsis   Critical care was time spent personally by me on the following activities:  Discussions with consultants, evaluation of patient's response to treatment, examination of patient, ordering and performing treatments and interventions, ordering and review of laboratory studies, ordering and review of radiographic studies, pulse oximetry, re-evaluation of patient's condition, obtaining history from patient or surrogate and review of old charts   (including critical care time)  Medications Ordered in ED Medications   cefTRIAXone (ROCEPHIN) 1 g in sodium chloride 0.9 % 100 mL IVPB (0 g Intravenous Stopped 12/02/19 0830)  0.9 %  sodium chloride infusion (1,000 mLs Intravenous New Bag/Given 12/02/19 1245)  aspirin EC tablet 81 mg (81 mg Oral Given 12/02/19 1245)  amLODipine (NORVASC) tablet 10 mg (10 mg Oral Given 12/02/19 1245)  atorvastatin (LIPITOR) tablet 10 mg (10 mg Oral Given 12/02/19 1246)  hydrochlorothiazide (HYDRODIURIL) tablet 25 mg (25 mg Oral Given 12/02/19 1245)  cyclobenzaprine (FLEXERIL) tablet 10 mg (has no administration in time range)  albuterol (PROVENTIL) (2.5 MG/3ML) 0.083% nebulizer solution 3 mL (has no administration in time range)  acetaminophen (TYLENOL) tablet 650 mg (has no administration in time range)    Or  acetaminophen (TYLENOL) suppository 650 mg (has no administration in time range)  HYDROcodone-acetaminophen (NORCO/VICODIN) 5-325 MG per tablet 1-2 tablet (has no administration in time range)  morphine 2 MG/ML injection 2 mg (has no administration in time range)  zolpidem (AMBIEN) tablet 5 mg (has no administration in time range)  docusate sodium (COLACE) capsule 100 mg (100 mg Oral Given 12/02/19 1245)  polyethylene glycol (MIRALAX / GLYCOLAX) packet 17 g (has no administration in time range)  bisacodyl (DULCOLAX) EC tablet 5 mg (has no administration in time range)  ondansetron (ZOFRAN) tablet 4 mg (has no administration in time range)    Or  ondansetron (ZOFRAN) injection 4 mg (has no administration in time range)  hydrALAZINE (APRESOLINE) injection 5 mg (has no administration in time range)  insulin aspart (novoLOG) injection 0-15 Units (8 Units Subcutaneous Given 12/02/19 1737)  enoxaparin (LOVENOX) injection 40 mg (40 mg Subcutaneous Refused 12/02/19 1246)  sodium chloride flush (NS) 0.9 % injection 3 mL (3 mLs Intravenous Given 12/02/19 1244)  insulin aspart (novoLOG) injection 0-5 Units (has no administration in time range)  metoCLOPramide (REGLAN) injection 10 mg (10  mg  Intravenous Given 12/02/19 0749)  HYDROmorphone (DILAUDID) injection 1 mg (1 mg Intravenous Given 12/02/19 [id number])  living well with diabetes book MISC ( Does not apply Given 12/02/19 1730)    ED Course  I have reviewed the triage vital signs and the nursing notes.  Pertinent labs & imaging results that were available during my care of the patient were reviewed by me and considered in my medical decision making (see chart for details).  Clinical Course as of Dec 01 1852  Fri Dec 02, 2019  0703 Appearance(!): TURBID [AH]  0719 Patient notable tachycardic, febrile.  No CVA tenderness, UA show HGB and Pyuria, without bacteria. She also has no CVA tenderness.  CODE SEPSIS initiated. ? Infected stone, although patient's clinical appearance does not suggest this. DDx includes Covid     [AH]  0830 WBC(!): 14.0 [AH]    Clinical Course User Index [AH] Margarita Mail, PA-C   MDM Rules/Calculators/A&P                      [id number] pain VS: BP (!) 156/83 (BP Location: Left Arm)   Pulse (!) 120   Temp 99.7 F (37.6 C)   Resp 20   Ht 5\' 4"  (1.626 m)   Wt 111.4 kg   LMP 11/10/2019   SpO2 100%   BMI 42.16 kg/m   [id number] is gathered by patient  and mother at bedside. Previous records obtained and reviewed. DDX:The patient's complaint of Flank pain  involves an extensive number of diagnostic and treatment options, and is a complaint that carries with it a high risk of complications, morbidity, and potential mortality. Given the large differential diagnosis, medical decision making is of high complexity. The differential diagnosis of emergent flank pain includes, but is not limited to :Abdominal aortic aneurysm,, Renal artery embolism,Renal vein thrombosis, Aortic dissection, Mesenteric ischemia, Pyelonephritis, Renal infarction, Renal hemorrhage, Nephrolithiasis/ Renal Colic, Bladder tumor,Cystitis, Biliary colic, Pancreatitis Perforated peptic ulcer Appendicitis ,Inguinal Hernia,  Diverticulitis, Bowel obstruction Ectopic Pregnancy,PID/TOA,Ovarian cyst, Ovarian torsion, Shingles Lower lobe pneumonia, Retroperitoneal hematoma/abscess/tumor, Epidural abscess, Epidural hematoma Labs: I ordered reviewed and interpreted labs which include lactic acid which is initially elevated but decreased with fluid resuscitation, CMP with elevated glucose and associated hyponatremia, mildly elevated ALT of insignificant value at this time.  Point-of-care Covid test is negative.  CBC showed does show an elevated white blood cell count.  PT/INR within normal limits, negative pregnancy test, urine culture and blood cultures are pending.  UA shows high amount of red and white blood cells with elevated blood glucose, no bacteria seen, there is no commentary on any skin cells.   Imaging: I ordered and reviewed images which included . I independently visualized and interpreted all imaging.  CT renal stone study.  Significant findings include right-sided hydrohydronephrosis without evidence of obstructive pathology similar to previous CT scan.  Also shows a large fibroid..  EKG: Sinus tachycardia at a rate of 117 Consults: Karmen Bongo MDM: Patient here with flank pain, she had hematuria and pyuria without evidence of bacteriuria.  Have question for recently passed stone which is why I obtain CT renal stone study.  There is no evidence of an obstruction.  Patient clinically did not appear to be having renal colic.  In a given the combination of this flank pain, abnormal urine, fever and feeding sepsis criteria upon presentation she very likely has a pyelonephritis.  Noncontrast CT study was not helpful in making this diagnosis.  Patient has  received IV Rocephin.  She has a Covid test pending.  She will be admitted to the hospital for further treatment. Patient disposition: Admit The patient appears reasonably stabilized for admission considering the current resources, flow, and capabilities available in the  ED at this time, and I doubt any other [facility] requiring further screening and/or treatment in the ED prior to admission.         Tiffany Le was evaluated in Emergency Department on 12/02/2019 for the symptoms described in the history of present illness. She was evaluated in the context of the global COVID-19 pandemic, which necessitated consideration that the patient might be at risk for infection with the SARS-CoV-2 virus that causes COVID-19. Institutional protocols and algorithms that pertain to the evaluation of patients at risk for COVID-19 are in a state of rapid change based on information released by regulatory bodies including the CDC and federal and state organizations. These policies and algorithms were followed during the patient's care in the ED.    Final Clinical Impression(s) / ED Diagnoses Final diagnoses:  Pyelonephritis     .ahmdmd  Rx / DC Orders ED Discharge Orders    None       Margarita Mail, PA-C 12/02/19 1856    Tegeler, Gwenyth Allegra, MD 12/05/19 [phone]

## 2019-12-02 NOTE — H&P (Signed)
History and Physical    Tiffany Le [id number] DOB: [birth year] DOA: 12/01/2019  PCP: Azzie Glatter, FNP Consultants:  None Patient coming from:  Home - lives alone; [name]: Mother, [phone]  Chief Complaint: urinary symptoms  HPI: Tiffany Le is a 44 y.o. female with medical history significant of HTN; HLD; DM; and morbid obesity (BMI 42) presenting with urinary symptoms.  She reports side pain.  She has had this pain before with a kidney infection.  The pain was on her right side and started 2 days ago.  +polyuria, color was off and cloudy.  No fevers at home.  She had some nausea en route, last ate 36 hours ago with dry heaves.  No dysuria.  With her last UTI, she had dysuria - but not this time.     She was taking previously Metformin and Glipizide for her diabetes but stopped taking the Metformin about 4-5 months ago.  She was told her numbers "were really good" a month ago and to keep doing what she is doing.  She thought the pill was too big and it made her miserable.   ED Course:  Diabetic with flank pain.  R hydronephrosis.  Urine culture pending, has sepsis criteria.     Review of Systems: As per HPI; otherwise review of systems reviewed and negative.   Ambulatory Status:  Ambulates without assistance  COVID Vaccine Status:  None  Past Medical History:  Diagnosis Date  . Diabetes mellitus without complication ([place])   . Hyperlipidemia age 15  . Hypertension age 51  . Iron deficiency anemia   . Obesity     Past Surgical History:  Procedure Laterality Date  . CESAREAN SECTION  3/96  . ORIF FEMUR FRACTURE      Social History   Socioeconomic History  . Marital status: Single    Spouse name: Not on file  . Number of children: Not on file  . Years of education: Not on file  . Highest education level: Not on file  Occupational History  . Occupation: Event organiser  Tobacco Use  . Smoking status: Never Smoker  . Smokeless tobacco: Never Used   Substance and Sexual Activity  . Alcohol use: No  . Drug use: No  . Sexual activity: Not Currently    Partners: Male    Comment: husband currently in [place], returning 02/2013  Other Topics Concern  . 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  Lives with 80 year old daughter, 1 cat Husband is living in [place] (citizen there), trying to come to [place] (has been delayed)   Social Determinants of Radio broadcast assistant Strain:   . Difficulty of Paying Living Expenses:   Food Insecurity:   . Worried About Charity fundraiser in the Last Year:   . Arboriculturist in the Last Year:   Transportation Needs:   . Film/video editor (Medical):   Marland Kitchen Lack of Transportation (Non-Medical):   Physical Activity:   . Days of Exercise per Week:   . Minutes of Exercise per Session:   Stress:   . Feeling of Stress :   Social Connections:   . Frequency of Communication with Friends and Family:   . Frequency of Social Gatherings with Friends and Family:   . Attends Religious Services:   . Active Member of Clubs or Organizations:   . Attends Club or  Organization Meetings:   Marland Kitchen Marital Status:   Intimate Partner Violence:   . Fear of Current or Ex-Partner:   . Emotionally Abused:   Marland Kitchen Physically Abused:   . Sexually Abused:     Allergies  Allergen Reactions  . Penicillins Other (See Comments)    Historical Has patient had a PCN reaction causing immediate rash, facial/tongue/throat swelling, SOB or lightheadedness with hypotension: No Has patient had a PCN reaction causing severe rash involving mucus membranes or skin necrosis: No Has patient had a PCN reaction that required hospitalization: No Has patient had a PCN reaction occurring within the last 10 years: No If all of the above answers are "NO", then may proceed with Cephalosporin use.    Family History  Problem Relation Age of Onset  . Heart disease Mother         CABG <50  . Hypertension Mother   . Diabetes Mother   . Hyperlipidemia Mother   . Heart disease Father   . Hyperlipidemia Father   . Hypertension Father   . Early death Father   . Healthy Daughter   . Diabetes Maternal Aunt   . Cancer Maternal Grandmother        stomach cancer  . Diabetes Maternal Aunt   . Cancer Cousin        colon cancer (dx'd 79)    Prior to Admission medications   Medication Sig Start Date End Date Taking? Authorizing Provider  amLODipine (NORVASC) 5 MG tablet Take 2 tablets (Total= 10 mg), by mouth daily. 07/20/19  Yes Azzie Glatter, FNP  aspirin EC 81 MG tablet Take 81 mg by mouth daily.   Yes [provider]  atorvastatin (LIPITOR) 10 MG tablet TAKE 1 TABLET BY MOUTH EVERY DAY 06/09/19  Yes Azzie Glatter, FNP  cyclobenzaprine (FLEXERIL) 10 MG tablet Take 1 tablet (10 mg total) by mouth 2 (two) times daily as needed for muscle spasms. 07/20/19  Yes Azzie Glatter, FNP  glipiZIDE (GLUCOTROL) 10 MG tablet TAKE 1 TABLET BY MOUTH TWICE DAILY BEFORE A MEAL 09/07/19  Yes Azzie Glatter, FNP  hydrochlorothiazide (HYDRODIURIL) 25 MG tablet Take 1 tablet (25 mg total) by mouth daily. 09/07/19  Yes Azzie Glatter, FNP  albuterol (PROVENTIL HFA) 108 (90 Base) MCG/ACT inhaler Inhale 2 puffs into the lungs every 6 (six) hours as needed for wheezing. 10/19/18   Azzie Glatter, FNP  ergocalciferol (VITAMIN D2) 1.25 MG (50000 UT) capsule Take 1 capsule (50,000 Units total) by mouth once a week. Patient not taking: Reported on 12/02/2019 07/20/19   Azzie Glatter, FNP  metFORMIN (GLUCOPHAGE) 500 MG tablet Take 1 tablet (500 mg total) by mouth 2 (two) times daily with a meal. Patient not taking: Reported on 12/02/2019 07/20/19   Azzie Glatter, FNP    Physical Exam: Vitals:   12/02/19 1015 12/02/19 1100 12/02/19 1248 12/02/19 1730  BP: (!) 164/83 (!) 145/79 (!) 162/92 (!) 152/84  Pulse: (!) 104 (!) 105  (!) 106  Resp: 20 20 16 16   Temp: 98.9 F  (37.2 C)     TempSrc: Oral     SpO2: 97% 97%  99%  Weight:      Height:         . General:  Appears calm and comfortable and is NAD . Eyes:  PERRL, EOMI, normal lids, iris . ENT:  grossly normal hearing, lips & tongue, mmm . Neck:  no LAD, masses or thyromegaly . Cardiovascular:  RRR, no m/r/g. No LE edema.  Marland Kitchen Respiratory:   CTA bilaterally with no wheezes/rales/rhonchi.  Normal respiratory effort. . Abdomen:  soft, NT, ND, NABS . Back:   normal alignment, no CVAT but mild right-sided apparently MSK discomfort . Skin:  no rash or induration seen on limited exam . Musculoskeletal:  grossly normal tone BUE/BLE, good ROM, no bony abnormality . Psychiatric:  grossly normal mood and affect, speech fluent and appropriate, AOx3 . Neurologic:  CN 2-12 grossly intact, moves all extremities in coordinated fashion    Radiological Exams on Admission: DG Chest Port 1 View  Result Date: 12/02/2019 CLINICAL DATA:  44 year old female with fever, flank and back pain. EXAM: PORTABLE CHEST 1 VIEW COMPARISON:  CT Abdomen and Pelvis 12/26/2017. FINDINGS: Portable AP semi upright view at 0728 hours. Lung volumes and mediastinal contours are within normal limits. Allowing for portable technique the lungs are clear. Visualized tracheal air column is within normal limits. No pneumothorax or pleural effusion. Paucity of bowel gas in the upper abdomen. No acute osseous abnormality identified. IMPRESSION: Negative portable chest. Electronically Signed   By: Genevie Ann M.D.   On: 12/02/2019 07:41   CT Renal Stone Study  Result Date: 12/02/2019 CLINICAL DATA:  44 year old female with right flank and lower quadrant pain for 3 days. EXAM: CT ABDOMEN AND PELVIS WITHOUT CONTRAST TECHNIQUE: Multidetector CT imaging of the abdomen and pelvis was performed following the standard protocol without IV contrast. COMPARISON:  CT Abdomen and Pelvis 12/26/2017. FINDINGS: Lower chest: Negative. Hepatobiliary: Negative noncontrast  liver and gallbladder. Pancreas: Negative. Spleen: Negative. Adrenals/Urinary Tract: Normal adrenal glands. Negative noncontrast left kidney and ureter. On the right side there is mild hydronephrosis, and evidence of right periureteral stranding. And there are numerous chronic pelvic phleboliths bilaterally. The distal ureter is sometimes difficult to delineate, with no ureteral calculus identified. No calculus identified within the bladder. Unremarkable urinary bladder. No superimposed right nephrolithiasis. Stomach/Bowel: Negative rectum. Redundant but otherwise negative sigmoid colon. Mild retained stool upstream of the sigmoid. Redundant hepatic flexure. Normal appendix visible on coronal image 47. No large bowel inflammation. Negative terminal ileum. No dilated small bowel. Decompressed stomach and duodenum. No free air, free fluid. Vascular/Lymphatic: Vascular patency is not evaluated in the absence of IV contrast. No atherosclerosis identified. Reproductive: Chronic subserosal fibroid at the uterine fundus, not significantly changed from 2019, 47 mm diameter. Otherwise negative noncontrast appearance. Other: No pelvic free fluid. Musculoskeletal: No acute osseous abnormality identified. Partially visible chronic right femur ORIF. IMPRESSION: 1. Mild right hydronephrosis and periureteral stranding, but no ureteral calculus or obstructing etiology can be identified. A similar CT appearance was noted in 2019 and 2018, and there are numerous pelvic phleboliths. Main differential considerations are recently passed stone, occult obstructing stone, ureteral stricture, and ascending UTI. If pyelonephritis is suspected CT Abdomen and Pelvis with IV contrast would be valuable. Otherwise a retrograde pyelogram may be valuable. 2. Normal appendix. No other acute or inflammatory process identified in noncontrast abdomen and pelvis. 3. Chronic subserosal uterine fundal fibroid. Electronically Signed   By: Genevie Ann M.D.    On: 12/02/2019 09:55    EKG: Independently reviewed.  Sinus tachycardia with rate 117; nonspecific ST changes with no evidence of acute ischemia   Labs on Admission: I have personally reviewed the available labs and imaging studies at the time of the admission.  Pertinent labs:   Na++ 132 Glucose 299 AST 28/ALT 50 Lactate 2.1, 1.5 INR 1.0 HCG negative A1c 11.8 Procalcitonin <0.10 COVID  negative  Assessment/Plan Principal Problem:   Sepsis secondary to UTI [facility]) Active Problems:   Essential hypertension, benign   Pure hypercholesterolemia   Uncontrolled type 2 diabetes mellitus with hyperglycemia, without long-term current use of insulin (HCC)   Elevated LFTs   Class 3 severe obesity due to excess calories with serious comorbidity and body mass index (BMI) of 40.0 to 44.9 in adult (HCC)   Sepsis -SIRS criteria in this patient includes: Leukocytosis, fever, tachycardia, tachypnea  -Patient has evidence of acute organ failure with elevated lactate >2 that is not easily explained by another condition. -While awaiting blood cultures, this appears to be a preseptic condition. -Sepsis protocol initiated -Suspected source is UTI - she has urinary symptoms and mildly abnormal CT findings on CT renal study -Will order CT A/P with contrast -Blood and urine cultures pending -Will place in observation status with telemetry and continue to monitor -Treat with IV Rocephin for presumed urinary source-Will add HIV -Will trend lactate to ensure improvement -Will order lower respiratory tract procalcitonin level.  Antibiotics would not be indicated for PCT <0.1 and probably should not be used for < 0.25.  >0.5 indicates infection and >>0.5 indicates more serious disease.  As the procalcitonin level normalizes, it will be reasonable to consider de-escalation of antibiotic coverage.  Uncontrolled DM -All prior A1cs indicate poor control, as does today's -hold Glucotrol - but this is clearly  insufficient therapy -Cover with moderate-scale SSI -Diabetes coordinator consult requested  HTN -Continue Norvasc, HCTZ  HLD -Continue Lipitor  Elevated LFTs -Present since at least 2015 -It is actually improved from prior -Suspect fatty liver component  Morbid obesity Body mass index is 42.16 kg/m.  Note: This patient has been tested and is negative for the novel coronavirus COVID-19.   DVT prophylaxis:  Lovenox Code Status:  Full - confirmed with patient/family Family Communication: None present; she did not request that I contact her family at the time of admission  Disposition Plan:  The patient is from: home  Anticipated d/c is to: home without [facility] services   Anticipated d/c date will depend on clinical response to treatment, but possibly as early as tomorrow if she has excellent response to treatment  Patient is currently: acutely ill Consults called: None  Admission status: It is my clinical opinion that referral for OBSERVATION is reasonable and necessary in this patient based on the above information provided. The aforementioned taken together are felt to place the patient at high risk for further clinical deterioration. However it is anticipated that the patient may be medically stable for discharge from the hospital within 24 to 48 hours.     Karmen Bongo MD [facility]   How to contact the [facility] Attending or Consulting provider [place] or covering provider during after hours [place], for this patient?  1. Check the care team in [facility] and look for a) attending/consulting TRH provider listed and b) the [facility] team listed 2. Log into [email or web address] and use [place] universal password to access. If you do not have the password, please contact the hospital operator. 3. Locate the [facility] provider you are looking for under [facility] and page to a number that you can be directly reached. 4. If you still have difficulty reaching the provider, please page the  [facility] (Director on Call) for the Hospitalists listed on amion for assistance.   12/02/2019, 5:49 PM

## 2019-12-02 NOTE — ED Notes (Signed)
Tele Dinner ordered 

## 2019-12-02 NOTE — Progress Notes (Signed)
Inpatient Diabetes Program Recommendations  AACE/ADA: New Consensus Statement on Inpatient Glycemic Control   Target Ranges:  Prepandial:   less than 140 mg/dL      Peak postprandial:   less than 180 mg/dL (1-2 hours)      Critically ill patients:  140 - 180 mg/dL   Results for Tiffany Le, Tiffany Le (MRN [id number]) as of 12/02/2019 15:15  Ref. Range 12/01/2019 21:28 12/02/2019 12:15  Glucose-Capillary Latest Ref Range: 70 - 99 mg/dL 234 (H) 248 (H)  Results for Tiffany, Le (MRN [id number]) as of 12/02/2019 15:15  Ref. Range 07/20/2019 10:14 12/02/2019 12:30  Hemoglobin A1C Latest Ref Range: 4.8 - 5.6 % 10.4 (A) 11.8 (H)   Review of Glycemic Control  Diabetes history: DM2 Outpatient Diabetes medications: Glipizide 10 mg BID  Current orders for Inpatient glycemic control: Novolog 0-15 units TID with meals, Novolog 0-5 units QHS  Inpatient Diabetes Program Recommendations:    A1C: A1C 11.8% on 12/02/19 indicating an average glucose of 292 mg/dl over the past 2-3 months.  NOTE: Noted consult for Diabetes Coordinator. Diabetes Coordinator working remotely. Patient is current in Emergency Room. Noted in H&P, patient reports that she was only taking Glipizide for DM control and that she stopped Metformin 4-5 months ago because she was told that her numbers were really good a month ago and she also noted it made her miserable.  Spoke with patient over the phone about diabetes and home regimen for diabetes control. Patient reports being followed by PCP for diabetes management. Patient reports that she seen PCP in January and she states that she was only taking Glipizide 10 mg BID for DM control. Patient notes that she stopped Metforin 4-5 months ago and she could not tolerate GI effects. She reports that she actually asked PCP about potentially using a once a week injectable DM medication because she had a friend on it and it helped improve her DM control. Patient states that her PCP said she was not going to  start her on anything at that time because her numbers were good.  Patient reports that she has not been checking glucose lately because she lost her glucometer. Patient is currently uninsured and paying out of pocket for medications and supplies. Discussed Reli-On Prime glucometer at [facility] and testing supplies which are affordable ($9 for glucometer and $9 for box of 50 test strips).  Will ask onsite diabetes coordinator to take patient a new Reli-On glucometer and testing supplies.  Discussed A1C results (11.8% on 12/02/19 ) and explained that current A1C indicates an average glucose of 292 mg/dl over the past 2-3 months. Discussed glucose and A1C goals. Discussed importance of checking CBGs and maintaining good CBG control to prevent long-term and short-term complications. Explained how hyperglycemia leads to damage within blood vessels which lead to the common complications seen with uncontrolled diabetes. Stressed to the patient the importance of improving glycemic control to prevent further complications from uncontrolled diabetes. Informed patient that [facility] consult would be ordered to assist with medications if needed. Ordered Living Well with DM book. Encouraged patient to start checking glucose at home and explained that she will likely need additional DM medications as an outpatient to get DM under control. Patient states she has never taken insulin in the past as an outpatient. Will need affordable DM medication since patient is paying out of pocket for medications.  Patient states she has a follow up appointment with PCP on this coming Wednesday and she plans to talk with her about  her DM management.  Patient verbalized understanding of information discussed and reports no further questions at this time related to diabetes. If MD plans to discharge patient new to insulin, she will need an affordable insulin (such as NOVOLIN NPH, NOVOLIN Regular, and/or NOVOLIN 70/30) which is $25 per vial or $43 per box  of 5 insulin pens at [facility]. Patient will also need to be educated on insulin by bedside RNs.    Thanks, Barnie Alderman, RN, MSN, CDE Diabetes Coordinator Inpatient Diabetes Program [phone] (Team Pager from 8am to 5pm)

## 2019-12-03 DIAGNOSIS — A419 Sepsis, unspecified organism: Principal | ICD-10-CM

## 2019-12-03 DIAGNOSIS — N390 Urinary tract infection, site not specified: Secondary | ICD-10-CM | POA: Diagnosis present

## 2019-12-03 DIAGNOSIS — N12 Tubulo-interstitial nephritis, not specified as acute or chronic: Secondary | ICD-10-CM

## 2019-12-03 DIAGNOSIS — I10 Essential (primary) hypertension: Secondary | ICD-10-CM

## 2019-12-03 LAB — GLUCOSE, CAPILLARY
Glucose-Capillary: 254 mg/dL — ABNORMAL HIGH (ref 70–99)
Glucose-Capillary: 210 mg/dL — ABNORMAL HIGH (ref 70–99)
Glucose-Capillary: 208 mg/dL — ABNORMAL HIGH (ref 70–99)
Glucose-Capillary: 291 mg/dL — ABNORMAL HIGH (ref 70–99)

## 2019-12-03 LAB — CBC
HCT: 35.2 % — ABNORMAL LOW (ref 36.0–46.0)
Hemoglobin: 11.1 g/dL — ABNORMAL LOW (ref 12.0–15.0)
MCH: 26.7 pg (ref 26.0–34.0)
MCHC: 31.5 g/dL (ref 30.0–36.0)
MCV: 84.6 fL (ref 80.0–100.0)
Platelets: 261 10*3/uL (ref 150–400)
RBC: 4.16 MIL/uL (ref 3.87–5.11)
RDW: 12.8 % (ref 11.5–15.5)
WBC: 10.8 10*3/uL — ABNORMAL HIGH (ref 4.0–10.5)
nRBC: 0 % (ref 0.0–0.2)

## 2019-12-03 LAB — BASIC METABOLIC PANEL
Anion gap: 9 (ref 5–15)
BUN: 13 mg/dL (ref 6–20)
CO2: 25 mmol/L (ref 22–32)
Calcium: 8.3 mg/dL — ABNORMAL LOW (ref 8.9–10.3)
Chloride: 102 mmol/L (ref 98–111)
Creatinine, Ser: 0.64 mg/dL (ref 0.44–1.00)
GFR calc Af Amer: >60 mL/min (ref >60)
GFR calc non Af Amer: >60 mL/min (ref >60)
Glucose, Bld: 196 mg/dL — ABNORMAL HIGH (ref 70–99)
Potassium: 3.1 mmol/L — ABNORMAL LOW (ref 3.5–5.1)
Sodium: 136 mmol/L (ref 135–145)

## 2019-12-03 MED ORDER — INSULIN STARTER KIT- SYRINGES (ENGLISH)
1.0000 | Freq: Once | Status: DC
  Filled 2019-12-03: qty 1

## 2019-12-03 MED ORDER — SODIUM CHLORIDE 0.9 % IV SOLN
1000.0000 mL | INTRAVENOUS | Status: DC
  Administered 2019-12-03 (×2): 1000 mL via INTRAVENOUS

## 2019-12-03 MED ORDER — MORPHINE SULFATE (PF) 2 MG/ML IV SOLN: 1.0000 mg | INTRAVENOUS | Status: DC | PRN

## 2019-12-03 MED ORDER — INSULIN DETEMIR 100 UNIT/ML ~~LOC~~ SOLN
6.0000 [IU] | Freq: Every day | SUBCUTANEOUS | Status: DC
  Administered 2019-12-03 – 2019-12-04 (×2): 6 [IU] via SUBCUTANEOUS
  Filled 2019-12-03 (×2): qty 0.06

## 2019-12-03 MED ORDER — GLIPIZIDE 5 MG PO TABS
10.0000 mg | ORAL_TABLET | Freq: Two times a day (BID) | ORAL | Status: DC
  Administered 2019-12-03 – 2019-12-04 (×3): 10 mg via ORAL
  Filled 2019-12-03 (×3): qty 2

## 2019-12-03 NOTE — Progress Notes (Signed)
PROGRESS NOTE                                                                                                                                                                                                             Patient Demographics:    Tiffany Le, is a 44 y.o. female, DOB - [birth year], [id number]  Admit date - 12/01/2019   Admitting Physician Karmen Bongo, MD  Outpatient Primary MD for the patient is Azzie Glatter, FNP  LOS - 0   Chief Complaint  Patient presents with  . Flank Pain       Brief Narrative     Tiffany Le is a 44 y.o. female with medical history significant of HTN; HLD; DM; and morbid obesity (BMI 42) presenting with urinary symptoms.  right flank pain, as well she does report history of pyelonephritis 2-1/2 years ago , her work-up in ED significant for right pyelonephritis . -As well her work-up significant for hyperglycemia, she has stopped her Metformin recently, she remains on glipizide    Subjective:    [name] today reports right flank pain has improved, T-max 101 over last 24 hours.    Assessment  & Plan :    Principal Problem:   Sepsis secondary to UTI [facility]) Active Problems:   Essential hypertension, benign   Pure hypercholesterolemia   Uncontrolled type 2 diabetes mellitus with hyperglycemia, without long-term current use of insulin (HCC)   Elevated LFTs   Class 3 severe obesity due to excess calories with serious comorbidity and body mass index (BMI) of 40.0 to 44.9 in adult [facility])  Sepsis due  -SIRS criteria in this patient includes: Leukocytosis, fever, tachycardia, tachypnea  -Patient has evidence of acute organ failure with elevated lactate >2 that is not easily explained by another condition. -Follow on blood cultures, and urine cultures. -CT abdomen pelvis significant for right side pyelonephritis. -Remains with IV antibiotics, will continue to follow cultures and adjust  antibiotic as needed, for now we will continue with IV regimen given his acute pyonephritis, and hyperglycemia. - continue with fluids, but will lower it to 75 cc.  Uncontrolled DM -Poorly controlled, A1c is 11.8 -CBG remains elevated, will continue with sliding scale, will resume back on home dose glipizide as well will add low-dose Levemir and adjust dose as needed. -For now we will hold  on resuming Metformin given she received IV contrast. -Diabetes coordinator consult requested  HTN -Continue Norvasc, HCTZ  HLD -Continue Lipitor  Elevated LFTs -Present since at least 2015 -It is actually improved from prior -Suspect fatty liver component  Morbid obesity Body mass index is 42.16 kg/m.  Hyperkalemia -Potassium at 3.1, repleted, recheck in a.m.    COVID-19 Labs  No results for input(s): DDIMER, FERRITIN, LDH, CRP in the last 72 hours.  Lab Results  Component Value Date   SARSCOV2NAA NEGATIVE 12/02/2019   Recent Labs  Lab 12/02/19 0647 12/03/19 0706  WBC 14.0* 10.8*  HGB 13.0 11.1*  HCT 40.6 35.2*  PLT 326 261  MCV 84.8 84.6  MCH 27.1 26.7  MCHC 32.0 31.5  RDW 12.6 12.8    Recent Labs  Lab 12/02/19 0702 12/02/19 1230 12/03/19 0706  NA 132*  --  136  K 3.8  --  3.1*  CL 95*  --  102  CO2 22  --  25  GLUCOSE 299*  --  196*  BUN 17  --  13  CREATININE 0.81  --  0.64  CALCIUM 9.1  --  8.3*  AST 28  --   --   ALT 50*  --   --   ALKPHOS 79  --   --   BILITOT 0.6  --   --   ALBUMIN 3.5  --   --   PROCALCITON  --  <0.10  --   INR 1.0  --   --   HGBA1C  --  11.8*  --     Recent Labs  Lab 12/02/19 1230 12/02/19 1332  PROCALCITON <0.10  --   SARSCOV2NAA  --  NEGATIVE        Code Status : Full  Family Communication  : none at bedside.  Disposition Plan  :  Status is: inpatient  The patient will require care spanning > 2 midnights and should be moved to inpatient because: IV treatments appropriate due to intensity of illness or  inability to take PO  Dispo: The patient is from: Home              Anticipated d/c is to: Home              Anticipated d/c date is: 1 day              Patient currently is not medically stable to d/c.        Barriers For Discharge : Acute pyelonephritis, risk for worsening infection in the setting of poorly controlled diabetes mellitus with hyperglycemia with A1c of 11.8, will need further specification of urine pathogen and further IV antibiotic treatment.  Consults  : None  Procedures  : None  DVT Prophylaxis  :  Frannie lovenox  Lab Results  Component Value Date   PLT 261 12/03/2019    Antibiotics  :    Anti-infectives (From admission, onward)   Start     Dose/Rate Route Frequency Ordered Stop   12/02/19 0800  cefTRIAXone (ROCEPHIN) 1 g in sodium chloride 0.9 % 100 mL IVPB     1 g 200 mL/hr over 30 Minutes Intravenous Every 24 hours 12/02/19 0702          Objective:   Vitals:   12/02/19 1730 12/02/19 1803 12/02/19 1806 12/03/19 0928  BP: (!) 152/84  (!) 156/83 138/78  Pulse: (!) 106  (!) 120 99  Resp: 16 20 20 18   Temp:  99.7 F (  37.6 C) 99.7 F (37.6 C) 98.9 F (37.2 C)  TempSrc:    Oral  SpO2: 99%  100% 100%  Weight:      Height:        Wt Readings from Last 3 Encounters:  12/02/19 111.4 kg  09/07/19 112.1 kg  10/19/18 107 kg     Intake/Output Summary (Last 24 hours) at 12/03/2019 1138 Last data filed at 12/03/2019 0400 Gross per 24 hour  Intake 1845.5 ml  Output --  Net 1845.5 ml     Physical Exam  Awake Alert, Oriented X 3, No new F.N deficits, Normal affect  Symmetrical Chest wall movement, Good air movement bilaterally, CTAB RRR,No Gallops,Rubs or new Murmurs, No Parasternal Heave +ve B.Sounds, Abd Soft, No tenderness,  No rebound - guarding or rigidity. No Cyanosis, Clubbing or edema, No new Rash or bruise      Data Review:    CBC Recent Labs  Lab 12/02/19 0647 12/03/19 0706  WBC 14.0* 10.8*  HGB 13.0 11.1*  HCT 40.6 35.2*   PLT 326 261  MCV 84.8 84.6  MCH 27.1 26.7  MCHC 32.0 31.5  RDW 12.6 12.8    Chemistries  Recent Labs  Lab 12/02/19 0702 12/03/19 0706  NA 132* 136  K 3.8 3.1*  CL 95* 102  CO2 22 25  GLUCOSE 299* 196*  BUN 17 13  CREATININE 0.81 0.64  CALCIUM 9.1 8.3*  AST 28  --   ALT 50*  --   ALKPHOS 79  --   BILITOT 0.6  --    ------------------------------------------------------------------------------------------------------------------ No results for input(s): CHOL, HDL, LDLCALC, TRIG, CHOLHDL, LDLDIRECT in the last 72 hours.  Lab Results  Component Value Date   HGBA1C 11.8 (H) 12/02/2019   ------------------------------------------------------------------------------------------------------------------ No results for input(s): TSH, T4TOTAL, T3FREE, THYROIDAB in the last 72 hours.  Invalid input(s): FREET3 ------------------------------------------------------------------------------------------------------------------ No results for input(s): VITAMINB12, FOLATE, FERRITIN, TIBC, IRON, RETICCTPCT in the last 72 hours.  Coagulation profile Recent Labs  Lab 12/02/19 0702  INR 1.0    No results for input(s): DDIMER in the last 72 hours.  Cardiac Enzymes No results for input(s): CKMB, TROPONINI, MYOGLOBIN in the last 168 hours.  Invalid input(s): CK ------------------------------------------------------------------------------------------------------------------ No results found for: BNP  Inpatient Medications  Scheduled Meds: . amLODipine  10 mg Oral Daily  . aspirin EC  81 mg Oral Daily  . atorvastatin  10 mg Oral Daily  . docusate sodium  100 mg Oral BID  . enoxaparin (LOVENOX) injection  40 mg Subcutaneous Q24H  . glipiZIDE  10 mg Oral BID AC  . hydrochlorothiazide  25 mg Oral Daily  . insulin aspart  0-15 Units Subcutaneous TID WC  . insulin aspart  0-5 Units Subcutaneous QHS  . insulin detemir  6 Units Subcutaneous Daily  . sodium chloride flush  3 mL  Intravenous Q12H   Continuous Infusions: . sodium chloride 1,000 mL (12/03/19 0917)  . cefTRIAXone (ROCEPHIN)  IV 1 g (12/03/19 0925)   PRN Meds:.acetaminophen **OR** acetaminophen, albuterol, bisacodyl, cyclobenzaprine, hydrALAZINE, HYDROcodone-acetaminophen, morphine injection, ondansetron **OR** ondansetron (ZOFRAN) IV, polyethylene glycol, zolpidem  Micro Results Recent Results (from the past 240 hour(s))  Urine culture     Status: Abnormal (Preliminary result)   Collection Time: 12/02/19  7:06 AM   Specimen: In/Out Cath Urine  Result Value Ref Range Status   Specimen Description IN/OUT CATH URINE  Final   Special Requests NONE  Final   Culture (A)  Final    >=100,000 COLONIES/mL  ESCHERICHIA COLI CULTURE REINCUBATED FOR BETTER GROWTH SUSCEPTIBILITIES TO FOLLOW Performed at [facility], [place] [address], [place], [place] [address]    Report Status PENDING  Incomplete  Blood Culture (routine x 2)     Status: None (Preliminary result)   Collection Time: 12/02/19  7:23 AM   Specimen: BLOOD  Result Value Ref Range Status   Specimen Description BLOOD SITE NOT SPECIFIED  Final   Special Requests   Final    BOTTLES DRAWN AEROBIC AND ANAEROBIC Blood Culture results may not be optimal due to an inadequate volume of blood received in culture bottles   Culture   Final    NO GROWTH < 12 HOURS Performed at [facility], [place] [address], [place], [place] [address]    Report Status PENDING  Incomplete  Blood Culture (routine x 2)     Status: None (Preliminary result)   Collection Time: 12/02/19  7:53 AM   Specimen: BLOOD  Result Value Ref Range Status   Specimen Description BLOOD LEFT ANTECUBITAL  Final   Special Requests   Final    BOTTLES DRAWN AEROBIC AND ANAEROBIC Blood Culture results may not be optimal due to an inadequate volume of blood received in culture bottles   Culture   Final    NO GROWTH < 12 HOURS Performed at [facility], [place] [address],  [place], [place] [address]    Report Status PENDING  Incomplete  SARS CORONAVIRUS 2 (TAT 6-24 HRS) Nasopharyngeal Nasopharyngeal Swab     Status: None   Collection Time: 12/02/19  1:32 PM   Specimen: Nasopharyngeal Swab  Result Value Ref Range Status   SARS Coronavirus 2 NEGATIVE NEGATIVE Final    Comment: (NOTE) SARS-CoV-2 target nucleic acids are NOT DETECTED. The SARS-CoV-2 RNA is generally detectable in upper and lower respiratory specimens during the acute phase of infection. Negative results do not preclude SARS-CoV-2 infection, do not rule out co-infections with other pathogens, and should not be used as the sole basis for treatment or other patient management decisions. Negative results must be combined with clinical observations, patient history, and epidemiological information. The expected result is Negative. Fact Sheet for Patients: SugarRoll.be Fact Sheet for Healthcare Providers: https://www.woods-mathews.com/ This test is not yet approved or cleared by the [place] FDA and  has been authorized for detection and/or diagnosis of SARS-CoV-2 by FDA under an Emergency Use Authorization (EUA). This EUA will remain  in effect (meaning this test can be used) for the duration of the COVID-19 declaration under Section 56 4(b)(1) of the Act, 21 U.S.C. section 360bbb-3(b)(1), unless the authorization is terminated or revoked sooner. Performed at [facility], [place] [address] [place], [place] [address]     Radiology Reports CT ABDOMEN PELVIS W CONTRAST  Result Date: 12/02/2019 CLINICAL DATA:  Elevated LFTs, abdominal abscess or infection suspected EXAM: CT ABDOMEN AND PELVIS WITH CONTRAST TECHNIQUE: Multidetector CT imaging of the abdomen and pelvis was performed using the standard protocol following bolus administration of intravenous contrast. CONTRAST:  157mL OMNIPAQUE IOHEXOL 300 MG/ML  SOLN COMPARISON:  December 02, 2019 FINDINGS:  Lower chest: The visualized heart size within normal limits. No pericardial fluid/thickening. No hiatal hernia. The visualized portions of the lungs are clear. Hepatobiliary: The liver is normal in density without focal abnormality.The main portal vein is patent. No evidence of calcified gallstones, gallbladder wall thickening or biliary dilatation. Pancreas: Unremarkable. No pancreatic ductal dilatation or surrounding inflammatory changes. Spleen: Normal in size  without focal abnormality. Adrenals/Urinary Tract: Both adrenal glands appear normal. There is mild right-sided pelvicaliectasis and ureterectasis with hyperenhancement of the renal pelvis and proximal ureter. No renal or collecting system calculi are seen. There is mild perinephric stranding seen. No left-sided renal or collecting system calculi are noted. The bladder is partially decompressed. Stomach/Bowel: The stomach, small bowel, and colon are normal in appearance. No inflammatory changes, wall thickening, or obstructive findings. Vascular/Lymphatic: There are no enlarged mesenteric, retroperitoneal, or pelvic lymph nodes. No significant vascular findings are present. Reproductive: Again noted is a partially exophytic posterior uterine fibroid with internal calcifications measuring 4.5 cm, not significantly changed since 2019. A small amount of fluid is seen within the endometrial canal. Other: No evidence of abdominal wall mass or hernia. Musculoskeletal: No acute or significant osseous findings. The patient is status post ORIF of the right femur. IMPRESSION: Findings which could be suggestive of acute mild right pyelonephritis. No renal calculi. Electronically Signed   By: Prudencio Pair M.D.   On: 12/02/2019 21:34   DG Chest Port 1 View  Result Date: 12/02/2019 CLINICAL DATA:  44 year old female with fever, flank and back pain. EXAM: PORTABLE CHEST 1 VIEW COMPARISON:  CT Abdomen and Pelvis 12/26/2017. FINDINGS: Portable AP semi upright view at  0728 hours. Lung volumes and mediastinal contours are within normal limits. Allowing for portable technique the lungs are clear. Visualized tracheal air column is within normal limits. No pneumothorax or pleural effusion. Paucity of bowel gas in the upper abdomen. No acute osseous abnormality identified. IMPRESSION: Negative portable chest. Electronically Signed   By: Genevie Ann M.D.   On: 12/02/2019 07:41   CT Renal Stone Study  Result Date: 12/02/2019 CLINICAL DATA:  44 year old female with right flank and lower quadrant pain for 3 days. EXAM: CT ABDOMEN AND PELVIS WITHOUT CONTRAST TECHNIQUE: Multidetector CT imaging of the abdomen and pelvis was performed following the standard protocol without IV contrast. COMPARISON:  CT Abdomen and Pelvis 12/26/2017. FINDINGS: Lower chest: Negative. Hepatobiliary: Negative noncontrast liver and gallbladder. Pancreas: Negative. Spleen: Negative. Adrenals/Urinary Tract: Normal adrenal glands. Negative noncontrast left kidney and ureter. On the right side there is mild hydronephrosis, and evidence of right periureteral stranding. And there are numerous chronic pelvic phleboliths bilaterally. The distal ureter is sometimes difficult to delineate, with no ureteral calculus identified. No calculus identified within the bladder. Unremarkable urinary bladder. No superimposed right nephrolithiasis. Stomach/Bowel: Negative rectum. Redundant but otherwise negative sigmoid colon. Mild retained stool upstream of the sigmoid. Redundant hepatic flexure. Normal appendix visible on coronal image 47. No large bowel inflammation. Negative terminal ileum. No dilated small bowel. Decompressed stomach and duodenum. No free air, free fluid. Vascular/Lymphatic: Vascular patency is not evaluated in the absence of IV contrast. No atherosclerosis identified. Reproductive: Chronic subserosal fibroid at the uterine fundus, not significantly changed from 2019, 47 mm diameter. Otherwise negative  noncontrast appearance. Other: No pelvic free fluid. Musculoskeletal: No acute osseous abnormality identified. Partially visible chronic right femur ORIF. IMPRESSION: 1. Mild right hydronephrosis and periureteral stranding, but no ureteral calculus or obstructing etiology can be identified. A similar CT appearance was noted in 2019 and 2018, and there are numerous pelvic phleboliths. Main differential considerations are recently passed stone, occult obstructing stone, ureteral stricture, and ascending UTI. If pyelonephritis is suspected CT Abdomen and Pelvis with IV contrast would be valuable. Otherwise a retrograde pyelogram may be valuable. 2. Normal appendix. No other acute or inflammatory process identified in noncontrast abdomen and pelvis. 3. Chronic subserosal uterine fundal  fibroid. Electronically Signed   By: Genevie Ann M.D.   On: 12/02/2019 09:55     Phillips Climes M.D on 12/03/2019 at 11:38 AM  Between 7am to 7pm - Pager - [phone]  After 7pm go to [email or web address] - password [facility]  [facility] -  Office  [phone]

## 2019-12-03 NOTE — Progress Notes (Addendum)
Inpatient Diabetes Program Recommendations  AACE/ADA: New Consensus Statement on Inpatient Glycemic Control (2015)  Target Ranges:  Prepandial:   less than 140 mg/dL      Peak postprandial:   less than 180 mg/dL (1-2 hours)      Critically ill patients:  140 - 180 mg/dL   Results for ERSA, SENSENEY (MRN [id number]) as of 12/03/2019 14:04  Ref. Range 12/02/2019 12:15 12/02/2019 17:28 12/02/2019 21:47  Glucose-Capillary Latest Ref Range: 70 - 99 mg/dL 248 (H)  5 units NOVOLOG  259 (H)  8 units NOVOLOG  234 (H)  2 units NOVOLOG    Results for ELLANORA, TANIS (MRN [id number]) as of 12/03/2019 14:04  Ref. Range 12/03/2019 09:32 12/03/2019 12:29  Glucose-Capillary Latest Ref Range: 70 - 99 mg/dL 254 (H)  8 units NOVOLOG +  6 units LEVEMIR  210 (H)  5 units NOVOLOG    Results for LAVERN, JEFFRES (MRN [id number]) as of 12/03/2019 14:04  Ref. Range 07/20/2019 10:14 12/02/2019 12:30  Hemoglobin A1C Latest Ref Range: 4.8 - 5.6 % 10.4 (A) 11.8 (H)  (291 mg/dl)     Home DM Meds: Glipizide 10 mg BID   Current Orders: Levemir 6 units Daily      Novolog Moderate Correction Scale/ SSI (0-15 units) TID AC + HS      Glipizide 10 mg BID     MD- Note Levemir and Glipizide started this AM.  Per chart review, pt currently uninsured and paying out of pocket for all healthcare costs.  TOC consult has been placed to see if pt can get assistance with meds.  Looks like pt is going to the [facility] and seeing Kathe Becton, NP--last seen 09/07/2019.  If she is current there, she can likely use the [facility] for affordable meds at time of d/c, however, the [facility] pharmacy is NOT open on the weekends.  If patient discharged home prior to Monday, may be able to get [facility] letter to help with meds if she hasn't used the [facility] program in 1 year.  If she cannot use the [facility] program, we need to consider having pt gets meds from [facility].  NPH Insulin can be  purchased for $25 per vial from [facility].     Diabetes RN spoke with pt yesterday.  Per notes, pt was only taking Glipizide at home and stopped Metformin 4-5 months ago because she was told that her numbers were really good a month ago and she also noted it made her miserable  "She reports that she actually asked PCP about potentially using a once a week injectable DM medication because she had a friend on it and it helped improve her DM control. Patient states that her PCP said she was not going to start her on anything at that time because her numbers were good.  Patient reports that she has not been checking glucose lately because she lost her glucometer. Patient is currently uninsured and paying out of pocket for medications and supplies."     --Will follow patient during hospitalization--  Wyn Quaker RN, MSN, CDE Diabetes Coordinator Inpatient Glycemic Control Team Team Pager: [phone] (8a-5p)

## 2019-12-04 DIAGNOSIS — E1165 Type 2 diabetes mellitus with hyperglycemia: Secondary | ICD-10-CM

## 2019-12-04 LAB — BASIC METABOLIC PANEL
Anion gap: 10 (ref 5–15)
BUN: 14 mg/dL (ref 6–20)
CO2: 23 mmol/L (ref 22–32)
Calcium: 8.3 mg/dL — ABNORMAL LOW (ref 8.9–10.3)
Chloride: 103 mmol/L (ref 98–111)
Creatinine, Ser: 0.54 mg/dL (ref 0.44–1.00)
GFR calc Af Amer: >60 mL/min (ref >60)
GFR calc non Af Amer: >60 mL/min (ref >60)
Glucose, Bld: 170 mg/dL — ABNORMAL HIGH (ref 70–99)
Potassium: 4.1 mmol/L (ref 3.5–5.1)
Sodium: 136 mmol/L (ref 135–145)

## 2019-12-04 LAB — URINE CULTURE: Culture: >=100000 — AB

## 2019-12-04 LAB — GLUCOSE, CAPILLARY
Glucose-Capillary: 161 mg/dL — ABNORMAL HIGH (ref 70–99)
Glucose-Capillary: 277 mg/dL — ABNORMAL HIGH (ref 70–99)

## 2019-12-04 MED ORDER — INSULIN STARTER KIT- PEN NEEDLES (ENGLISH)
1.0000 | Freq: Once | Status: DC
  Filled 2019-12-04: qty 1

## 2019-12-04 MED ORDER — LISINOPRIL 10 MG PO TABS
10.0000 mg | ORAL_TABLET | Freq: Every day | ORAL | Status: DC
  Administered 2019-12-04: 10 mg via ORAL
  Filled 2019-12-04: qty 1

## 2019-12-04 MED ORDER — HYDROCHLOROTHIAZIDE 25 MG PO TABS: 25.0000 mg | ORAL_TABLET | Freq: Every day | ORAL | 0 refills | Status: DC

## 2019-12-04 MED ORDER — CEPHALEXIN 500 MG PO CAPS: 500.0000 mg | ORAL_CAPSULE | Freq: Four times a day (QID) | ORAL | 0 refills | Status: AC

## 2019-12-04 MED ORDER — ATORVASTATIN CALCIUM 10 MG PO TABS: 10.0000 mg | ORAL_TABLET | Freq: Every day | ORAL | 0 refills | Status: DC

## 2019-12-04 MED ORDER — INSULIN PEN NEEDLE 29G X 5MM MISC: 0 refills | Status: DC

## 2019-12-04 MED ORDER — LISINOPRIL 10 MG PO TABS
10.0000 mg | ORAL_TABLET | Freq: Every day | ORAL | 0 refills | Status: DC
Start: 2019-12-04 — End: 2019-12-23

## 2019-12-04 MED ORDER — GLIPIZIDE 10 MG PO TABS: ORAL_TABLET | ORAL | 0 refills | Status: DC

## 2019-12-04 MED ORDER — INSULIN GLARGINE 100 UNIT/ML SOLOSTAR PEN
8.0000 [IU] | PEN_INJECTOR | Freq: Every day | SUBCUTANEOUS | 0 refills | Status: DC
Start: 2019-12-04 — End: 2019-12-09

## 2019-12-04 MED ORDER — AMLODIPINE BESYLATE 10 MG PO TABS: 10.0000 mg | ORAL_TABLET | Freq: Every day | ORAL | 0 refills | Status: DC

## 2019-12-04 NOTE — Discharge Summary (Signed)
Tiffany Le, is a 44 y.o. female  DOB March 23, 1976  MRN [id number].  Admission date:  12/01/2019  Admitting Physician  Karmen Bongo, MD  Discharge Date:  12/04/2019   Primary MD  Azzie Glatter, FNP  Recommendations for primary care physician for things to follow:  -Please check CBC, CMP during next visit. -Adjust  antihypertensive regimen as needed, added low-dose lisinopril given she is diabetic. -Started on low-dose Lantus, adjust as needed, resume Metformin once appropriate, as she did receive IV contrast during hospital stay   Admission Diagnosis  Pyelonephritis [N12] Sepsis secondary to UTI ([place]) [A41.9, N39.0] Sepsis due to urinary tract infection ([place]) [A41.9, N39.0]   Discharge Diagnosis  Pyelonephritis [N12] Sepsis secondary to UTI ([place]) [A41.9, N39.0] Sepsis due to urinary tract infection ([place]) [A41.9, N39.0]    Principal Problem:   Sepsis secondary to UTI [facility]) Active Problems:   Essential hypertension, benign   Pure hypercholesterolemia   Uncontrolled type 2 diabetes mellitus with hyperglycemia, without long-term current use of insulin (HCC)   Elevated LFTs   Class 3 severe obesity due to excess calories with serious comorbidity and body mass index (BMI) of 40.0 to 44.9 in adult [facility])   Sepsis due to urinary tract infection ([place])      Past Medical History:  Diagnosis Date  . Diabetes mellitus without complication ([place])   . Hyperlipidemia age 64  . Hypertension age 61  . Iron deficiency anemia   . Obesity     Past Surgical History:  Procedure Laterality Date  . CESAREAN SECTION  3/96  . ORIF FEMUR FRACTURE         History of present illness and  Hospital Course:     Kindly see H&P for history of present illness and admission details, please review complete Labs, Consult reports and Test reports for all details in brief  HPI  from the history and physical done  on the day of admission 12/01/2019  HPI: Tiffany Le is a 44 y.o. female with medical history significant of HTN; HLD; DM; and morbid obesity (BMI 42) presenting with urinary symptoms.  She reports side pain.  She has had this pain before with a kidney infection.  The pain was on her right side and started 2 days ago.  +polyuria, color was off and cloudy.  No fevers at home.  She had some nausea en route, last ate 36 hours ago with dry heaves.  No dysuria.  With her last UTI, she had dysuria - but not this time.     She was taking previously Metformin and Glipizide for her diabetes but stopped taking the Metformin about 4-5 months ago.  She was told her numbers "were really good" a month ago and to keep doing what she is doing.  She thought the pill was too big and it made her miserable.  Hospital Course   Sepsis due  -SIRS criteria in this patient includes: Leukocytosis, fever, tachycardia, tachypnea -Patient has evidence of acute organ failure with elevated lactate >2 that  is not easily explained by another condition. -Blood cultures with no growth to date, urine culture growing E. coli and group B strep, group B strep was only 20,000. -CT abdomen pelvis significant for right side pyelonephritis. -She was treated with IV Rocephin during hospital stay, her E. coli pansensitive, she will be discharged on Keflex for another 8 days for total of 10 days treatment for her pyelonephritis.  Uncontrolled DM -Poorly controlled, A1c is 11.8, kept on her home dose glipizide during hospital stay, Metformin has been held given she received IV contrast, she was started on low-dose Lantus, she will be discharged on insulin, and she was recommended to hold Metformin given she received IV contrast, it can be resumed as an outpatient once appropriate.  HTN -Continue Norvasc, HCTZ, as well added low-dose lisinopril given she is diabetic  HLD -Continue Lipitor  Elevated LFTs -Present since at least  2015 -It is actually improved from prior -Suspect fatty liver component  Morbid obesity Body mass index is 42.16 kg/m.  Hyperkalemia -Repleted    Discharge Condition:  Stable   Follow UP  Follow-up Information    Azzie Glatter, FNP .   Specialty: Family Medicine Contact information: [place] [address] [phone]             Discharge Instructions  and  Discharge Medications    Discharge Instructions    Discharge instructions   Complete by: As directed    Follow with Primary MD Azzie Glatter, FNP in 7 days   Get CBC, CMP,  checked  by Primary MD next visit.    Activity: As tolerated with Full fall precautions use walker/cane & assistance as needed   Disposition Home    Diet: Heart Healthy /Carb modified , with feeding assistance and aspiration precautions.  For Heart failure patients - Check your Weight same time everyday, if you gain over 2 pounds, or you develop in leg swelling, experience more shortness of breath or chest pain, call your Primary MD immediately. Follow Cardiac Low Salt Diet and 1.5 lit/day fluid restriction.   On your next visit with your primary care physician please Get Medicines reviewed and adjusted.   Please request your Prim.MD to go over all Hospital Tests and Procedure/Radiological results at the follow up, please get all Hospital records sent to your Prim MD by signing hospital release before you go home.   If you experience worsening of your admission symptoms, develop shortness of breath, life threatening emergency, suicidal or homicidal thoughts you must seek medical attention immediately by calling 911 or calling your MD immediately  if symptoms less severe.  You Must read complete instructions/literature along with all the possible adverse reactions/side effects for all the Medicines you take and that have been prescribed to you. Take any new Medicines after you have completely understood  and accpet all the possible adverse reactions/side effects.   Do not drive, operating heavy machinery, perform activities at heights, swimming or participation in water activities or provide baby sitting services if your were admitted for syncope or siezures until you have seen by Primary MD or a Neurologist and advised to do so again.  Do not drive when taking Pain medications.    Do not take more than prescribed Pain, Sleep and Anxiety Medications  Special Instructions: If you have smoked or chewed Tobacco  in the last 2 yrs please stop smoking, stop any regular Alcohol  and or any Recreational drug use.  Wear Seat belts  while driving.   Please note  You were cared for by a hospitalist during your hospital stay. If you have any questions about your discharge medications or the care you received while you were in the hospital after you are discharged, you can call the unit and asked to speak with the hospitalist on call if the hospitalist that took care of you is not available. Once you are discharged, your primary care physician will handle any further medical issues. Please note that NO REFILLS for any discharge medications will be authorized once you are discharged, as it is imperative that you return to your primary care physician (or establish a relationship with a primary care physician if you do not have one) for your aftercare needs so that they can reassess your need for medications and monitor your lab values.   Increase activity slowly   Complete by: As directed      Allergies as of 12/04/2019      Reactions   Penicillins Other (See Comments)   Historical Has patient had a PCN reaction causing immediate rash, facial/tongue/throat swelling, SOB or lightheadedness with hypotension: No Has patient had a PCN reaction causing severe rash involving mucus membranes or skin necrosis: No Has patient had a PCN reaction that required hospitalization: No Has patient had a PCN reaction  occurring within the last 10 years: No If all of the above answers are "NO", then may proceed with Cephalosporin use.      Medication List    TAKE these medications   albuterol 108 (90 Base) MCG/ACT inhaler Commonly known as: Proventil HFA Inhale 2 puffs into the lungs every 6 (six) hours as needed for wheezing.   amLODipine 10 MG tablet Commonly known as: NORVASC Take 1 tablet (10 mg total) by mouth daily. Start taking on: December 05, 2019 What changed:   medication strength  how much to take  how to take this  when to take this  additional instructions   aspirin EC 81 MG tablet Take 81 mg by mouth daily.   atorvastatin 10 MG tablet Commonly known as: LIPITOR Take 1 tablet (10 mg total) by mouth daily.   cephALEXin 500 MG capsule Commonly known as: KEFLEX Take 1 capsule (500 mg total) by mouth 4 (four) times daily for 8 days.   cyclobenzaprine 10 MG tablet Commonly known as: FLEXERIL Take 1 tablet (10 mg total) by mouth 2 (two) times daily as needed for muscle spasms.   glipiZIDE 10 MG tablet Commonly known as: GLUCOTROL TAKE 1 TABLET BY MOUTH TWICE DAILY BEFORE A MEAL   hydrochlorothiazide 25 MG tablet Commonly known as: HYDRODIURIL Take 1 tablet (25 mg total) by mouth daily.   insulin glargine 100 UNIT/ML Solostar Pen Commonly known as: LANTUS Inject 8 Units into the skin daily.   Insulin Pen Needle 29G X 5MM Misc Use with insulin   lisinopril 10 MG tablet Commonly known as: ZESTRIL Take 1 tablet (10 mg total) by mouth daily.         Diet and Activity recommendation: See Discharge Instructions above   Consults obtained -  None   Major procedures and Radiology Reports - PLEASE review detailed and final reports for all details, in brief -    CT ABDOMEN PELVIS W CONTRAST  Result Date: 12/02/2019 CLINICAL DATA:  Elevated LFTs, abdominal abscess or infection suspected EXAM: CT ABDOMEN AND PELVIS WITH CONTRAST TECHNIQUE: Multidetector CT  imaging of the abdomen and pelvis was performed using the standard protocol following bolus  administration of intravenous contrast. CONTRAST:  181mL OMNIPAQUE IOHEXOL 300 MG/ML  SOLN COMPARISON:  December 02, 2019 FINDINGS: Lower chest: The visualized heart size within normal limits. No pericardial fluid/thickening. No hiatal hernia. The visualized portions of the lungs are clear. Hepatobiliary: The liver is normal in density without focal abnormality.The main portal vein is patent. No evidence of calcified gallstones, gallbladder wall thickening or biliary dilatation. Pancreas: Unremarkable. No pancreatic ductal dilatation or surrounding inflammatory changes. Spleen: Normal in size without focal abnormality. Adrenals/Urinary Tract: Both adrenal glands appear normal. There is mild right-sided pelvicaliectasis and ureterectasis with hyperenhancement of the renal pelvis and proximal ureter. No renal or collecting system calculi are seen. There is mild perinephric stranding seen. No left-sided renal or collecting system calculi are noted. The bladder is partially decompressed. Stomach/Bowel: The stomach, small bowel, and colon are normal in appearance. No inflammatory changes, wall thickening, or obstructive findings. Vascular/Lymphatic: There are no enlarged mesenteric, retroperitoneal, or pelvic lymph nodes. No significant vascular findings are present. Reproductive: Again noted is a partially exophytic posterior uterine fibroid with internal calcifications measuring 4.5 cm, not significantly changed since 2019. A small amount of fluid is seen within the endometrial canal. Other: No evidence of abdominal wall mass or hernia. Musculoskeletal: No acute or significant osseous findings. The patient is status post ORIF of the right femur. IMPRESSION: Findings which could be suggestive of acute mild right pyelonephritis. No renal calculi. Electronically Signed   By: Prudencio Pair M.D.   On: 12/02/2019 21:34   DG Chest Port 1  View  Result Date: 12/02/2019 CLINICAL DATA:  44 year old female with fever, flank and back pain. EXAM: PORTABLE CHEST 1 VIEW COMPARISON:  CT Abdomen and Pelvis 12/26/2017. FINDINGS: Portable AP semi upright view at 0728 hours. Lung volumes and mediastinal contours are within normal limits. Allowing for portable technique the lungs are clear. Visualized tracheal air column is within normal limits. No pneumothorax or pleural effusion. Paucity of bowel gas in the upper abdomen. No acute osseous abnormality identified. IMPRESSION: Negative portable chest. Electronically Signed   By: Genevie Ann M.D.   On: 12/02/2019 07:41   CT Renal Stone Study  Result Date: 12/02/2019 CLINICAL DATA:  44 year old female with right flank and lower quadrant pain for 3 days. EXAM: CT ABDOMEN AND PELVIS WITHOUT CONTRAST TECHNIQUE: Multidetector CT imaging of the abdomen and pelvis was performed following the standard protocol without IV contrast. COMPARISON:  CT Abdomen and Pelvis 12/26/2017. FINDINGS: Lower chest: Negative. Hepatobiliary: Negative noncontrast liver and gallbladder. Pancreas: Negative. Spleen: Negative. Adrenals/Urinary Tract: Normal adrenal glands. Negative noncontrast left kidney and ureter. On the right side there is mild hydronephrosis, and evidence of right periureteral stranding. And there are numerous chronic pelvic phleboliths bilaterally. The distal ureter is sometimes difficult to delineate, with no ureteral calculus identified. No calculus identified within the bladder. Unremarkable urinary bladder. No superimposed right nephrolithiasis. Stomach/Bowel: Negative rectum. Redundant but otherwise negative sigmoid colon. Mild retained stool upstream of the sigmoid. Redundant hepatic flexure. Normal appendix visible on coronal image 47. No large bowel inflammation. Negative terminal ileum. No dilated small bowel. Decompressed stomach and duodenum. No free air, free fluid. Vascular/Lymphatic: Vascular patency is not  evaluated in the absence of IV contrast. No atherosclerosis identified. Reproductive: Chronic subserosal fibroid at the uterine fundus, not significantly changed from 2019, 47 mm diameter. Otherwise negative noncontrast appearance. Other: No pelvic free fluid. Musculoskeletal: No acute osseous abnormality identified. Partially visible chronic right femur ORIF. IMPRESSION: 1. Mild right hydronephrosis and periureteral stranding,  but no ureteral calculus or obstructing etiology can be identified. A similar CT appearance was noted in 2019 and 2018, and there are numerous pelvic phleboliths. Main differential considerations are recently passed stone, occult obstructing stone, ureteral stricture, and ascending UTI. If pyelonephritis is suspected CT Abdomen and Pelvis with IV contrast would be valuable. Otherwise a retrograde pyelogram may be valuable. 2. Normal appendix. No other acute or inflammatory process identified in noncontrast abdomen and pelvis. 3. Chronic subserosal uterine fundal fibroid. Electronically Signed   By: Genevie Ann M.D.   On: 12/02/2019 09:55    Micro Results    Recent Results (from the past 240 hour(s))  Urine culture     Status: Abnormal   Collection Time: 12/02/19  7:06 AM   Specimen: In/Out Cath Urine  Result Value Ref Range Status   Specimen Description IN/OUT CATH URINE  Final   Special Requests NONE  Final   Culture (A)  Final    >=100,000 COLONIES/mL ESCHERICHIA COLI 20,000 COLONIES/mL GROUP B STREP(S.AGALACTIAE)ISOLATED TESTING AGAINST S. AGALACTIAE NOT ROUTINELY PERFORMED DUE TO PREDICTABILITY OF AMP/PEN/VAN SUSCEPTIBILITY. Performed at [facility], [place] [address], [place], [place] [address]    Report Status 12/04/2019 FINAL  Final   Organism ID, Bacteria ESCHERICHIA COLI (A)  Final      Susceptibility   Escherichia coli - MIC*    AMPICILLIN <=2 SENSITIVE Sensitive     CEFAZOLIN <=4 SENSITIVE Sensitive     CEFTRIAXONE <=1 SENSITIVE Sensitive     CIPROFLOXACIN  <=0.25 SENSITIVE Sensitive     GENTAMICIN <=1 SENSITIVE Sensitive     IMIPENEM <=0.25 SENSITIVE Sensitive     NITROFURANTOIN <=16 SENSITIVE Sensitive     TRIMETH/SULFA <=20 SENSITIVE Sensitive     AMPICILLIN/SULBACTAM <=2 SENSITIVE Sensitive     PIP/TAZO <=4 SENSITIVE Sensitive     * >=100,000 COLONIES/mL ESCHERICHIA COLI  Blood Culture (routine x 2)     Status: None (Preliminary result)   Collection Time: 12/02/19  7:23 AM   Specimen: BLOOD  Result Value Ref Range Status   Specimen Description BLOOD SITE NOT SPECIFIED  Final   Special Requests   Final    BOTTLES DRAWN AEROBIC AND ANAEROBIC Blood Culture results may not be optimal due to an inadequate volume of blood received in culture bottles   Culture   Final    NO GROWTH 1 DAY Performed at [facility], [address], [place], [place] [address]    Report Status PENDING  Incomplete  Blood Culture (routine x 2)     Status: None (Preliminary result)   Collection Time: 12/02/19  7:53 AM   Specimen: BLOOD  Result Value Ref Range Status   Specimen Description BLOOD LEFT ANTECUBITAL  Final   Special Requests   Final    BOTTLES DRAWN AEROBIC AND ANAEROBIC Blood Culture results may not be optimal due to an inadequate volume of blood received in culture bottles   Culture   Final    NO GROWTH 1 DAY Performed at [facility], [place] [address] [place],  [address]    Report Status PENDING  Incomplete  SARS CORONAVIRUS 2 (TAT 6-24 HRS) Nasopharyngeal Nasopharyngeal Swab     Status: None   Collection Time: 12/02/19  1:32 PM   Specimen: Nasopharyngeal Swab  Result Value Ref Range Status   SARS Coronavirus 2 NEGATIVE NEGATIVE Final    Comment: (NOTE) SARS-CoV-2 target nucleic acids are NOT DETECTED. The SARS-CoV-2 RNA is generally detectable in  upper and lower respiratory specimens during the acute phase of infection. Negative results do not preclude SARS-CoV-2 infection, do not rule out co-infections with other  pathogens, and should not be used as the sole basis for treatment or other patient management decisions. Negative results must be combined with clinical observations, patient history, and epidemiological information. The expected result is Negative. Fact Sheet for Patients: SugarRoll.be Fact Sheet for Healthcare Providers: https://www.woods-mathews.com/ This test is not yet approved or cleared by the [place] FDA and  has been authorized for detection and/or diagnosis of SARS-CoV-2 by FDA under an Emergency Use Authorization (EUA). This EUA will remain  in effect (meaning this test can be used) for the duration of the COVID-19 declaration under Section 56 4(b)(1) of the Act, 21 U.S.C. section 360bbb-3(b)(1), unless the authorization is terminated or revoked sooner. Performed at [facility], [place] [address], [place], [place] [address]        Today   Subjective:   Mariauna Prowse today has no headache,no chest abdominal pain,no new weakness tingling or numbness, feels much better wants to go home today.   Objective:   Blood pressure (!) 174/88, pulse 92, temperature 98.8 F (37.1 C), temperature source Oral, resp. rate 18, height 5\' 4"  (1.626 m), weight 111.4 kg, last menstrual period 11/10/2019, SpO2 97 %.   Intake/Output Summary (Last 24 hours) at 12/04/2019 1309 Last data filed at 12/04/2019 0330 Gross per 24 hour  Intake 1238.31 ml  Output --  Net 1238.31 ml    Exam Awake Alert, Oriented x 3, No new F.N deficits, Normal affect Symmetrical Chest wall movement, Good air movement bilaterally, CTAB RRR,No Gallops,Rubs or new Murmurs, No Parasternal Heave +ve B.Sounds, Abd Soft, Non tender, no CVA tenderness. No Cyanosis, Clubbing or edema, No new Rash or bruise  Data Review   CBC w Diff:  Lab Results  Component Value Date   WBC 10.8 (H) 12/03/2019   HGB 11.1 (L) 12/03/2019   HGB 12.9 10/19/2018   HCT 35.2 (L)  12/03/2019   HCT 39.6 10/19/2018   PLT 261 12/03/2019   PLT 318 10/19/2018   LYMPHOPCT 44 01/12/2014   MONOPCT 11 01/12/2014   EOSPCT 2 01/12/2014   BASOPCT 1 01/12/2014    CMP:  Lab Results  Component Value Date   NA 136 12/04/2019   NA 136 10/19/2018   K 4.1 12/04/2019   CL 103 12/04/2019   CO2 23 12/04/2019   BUN 14 12/04/2019   BUN 11 10/19/2018   CREATININE 0.54 12/04/2019   CREATININE 0.57 01/12/2014   PROT 8.4 (H) 12/02/2019   PROT 7.8 10/19/2018   ALBUMIN 3.5 12/02/2019   ALBUMIN 4.4 10/19/2018   BILITOT 0.6 12/02/2019   BILITOT 0.2 10/19/2018   ALKPHOS 79 12/02/2019   AST 28 12/02/2019   ALT 50 (H) 12/02/2019  .   Total Time in preparing paper work, data evaluation and todays exam - 10 minutes  Phillips Climes M.D on 12/04/2019 at 1:09 PM  [facility]   Office  [phone]

## 2019-12-04 NOTE — Discharge Instructions (Signed)
Pyelonephritis, Adult ° °Pyelonephritis is an infection that occurs in the kidney. The kidneys are organs that help clean the blood by moving waste out of the blood and into the pee (urine). This infection can happen quickly, or it can last for a long time. In most cases, it clears up with treatment and does not cause other problems. °What are the causes? °This condition may be caused by: °· Germs (bacteria) going from the bladder up to the kidney. This may happen after having a bladder infection. °· Germs going from the blood to the kidney. °What increases the risk? °This condition is more likely to develop in: °· Pregnant women. °· Older people. °· People who have any of these conditions: °? Diabetes. °? Inflammation of the prostate gland (prostatitis), in males. °? Kidney stones or bladder stones. °? Other problems with the kidney or the parts of your body that carry pee from the kidneys to the bladder (ureters). °? Cancer. °· People who have a small, thin tube (catheter) placed in the bladder. °· People who are sexually active. °· Women who use a medicine that kills sperm (spermicide) to prevent pregnancy. °· People who have had a prior urinary tract infection (UTI). °What are the signs or symptoms? °Symptoms of this condition include: °· Peeing often. °· A strong urge to pee right away. °· Burning or stinging when peeing. °· Belly pain. °· Back pain. °· Pain in the side (flank area). °· Fever or chills. °· Blood in the pee, or dark pee. °· Feeling sick to your stomach (nauseous) or throwing up (vomiting). °How is this treated? °This condition may be treated by: °· Taking antibiotic medicines by mouth (orally). °· Drinking enough fluids. °If the infection is bad, you may need to stay in the hospital. You may be given antibiotics and fluids that are put directly into a vein through an IV tube. °In some cases, other treatments may be needed. °Follow these instructions at home: °Medicines °· Take your antibiotic  medicine as told by your doctor. Do not stop taking the antibiotic even if you start to feel better. °· Take over-the-counter and prescription medicines only as told by your doctor. °General instructions ° °· Drink enough fluid to keep your pee pale yellow. °· Avoid caffeine, tea, and carbonated drinks. °· Pee (urinate) often. Avoid holding in pee for long periods of time. °· Pee before and after sex. °· After pooping (having a bowel movement), women should wipe from front to back. Use each tissue only once. °· Keep all follow-up visits as told by your doctor. This is important. °Contact a doctor if: °· You do not feel better after 2 days. °· Your symptoms get worse. °· You have a fever. °Get help right away if: °· You cannot take your medicine or drink fluids as told. °· You have chills and shaking. °· You throw up. °· You have very bad pain in your side or back. °· You feel very weak or you pass out (faint). °Summary °· Pyelonephritis is an infection that occurs in the kidney. °· In most cases, this infection clears up with treatment and does not cause other problems. °· Take your antibiotic medicine as told by your doctor. Do not stop taking the antibiotic even if you start to feel better. °· Drink enough fluid to keep your pee pale yellow. °This information is not intended to replace advice given to you by your health care provider. Make sure you discuss any questions you have with   your health care provider. Document Revised: 06/01/2018 Document Reviewed: 06/01/2018 Elsevier Patient Education  2020 [employer]  

## 2019-12-04 NOTE — Progress Notes (Signed)
Received referral to assist pt with her prescriptions and for an appointment with the [facility]. Met with pt. Pt plans to return home with the support of her mom, daughter, and boyfriend. Provided pt with a MATCH letter and a [employer]. Discussed with pt the importance of following with a physician after being D/C from the hospital. Encouraged pt to contact the [facility] in the morning to arrange a f/u appt. She reports that she has a PCP, but she can't remember her name. She goes to a free clinic in [place] but is not the [facility]. She reports that she fills her prescriptions at the [facility] . Provided pt with a list of pharmacies that accept the [facility] program.

## 2019-12-07 ENCOUNTER — Ambulatory Visit: Payer: Self-pay | Admitting: Family Medicine

## 2019-12-07 LAB — CULTURE, BLOOD (ROUTINE X 2)
Culture: NO GROWTH
Culture: NO GROWTH

## 2019-12-09 ENCOUNTER — Ambulatory Visit (INDEPENDENT_AMBULATORY_CARE_PROVIDER_SITE_OTHER): Payer: Self-pay | Admitting: Family Medicine

## 2019-12-09 ENCOUNTER — Other Ambulatory Visit: Payer: Self-pay

## 2019-12-09 ENCOUNTER — Encounter: Payer: Self-pay | Admitting: Family Medicine

## 2019-12-09 VITALS — BP 137/76 | HR 85 | Temp 98.7°F | Ht 64.0 in | Wt 243.4 lb

## 2019-12-09 DIAGNOSIS — N3000 Acute cystitis without hematuria: Secondary | ICD-10-CM

## 2019-12-09 DIAGNOSIS — R7309 Other abnormal glucose: Secondary | ICD-10-CM

## 2019-12-09 DIAGNOSIS — E1169 Type 2 diabetes mellitus with other specified complication: Secondary | ICD-10-CM

## 2019-12-09 DIAGNOSIS — Z09 Encounter for follow-up examination after completed treatment for conditions other than malignant neoplasm: Secondary | ICD-10-CM

## 2019-12-09 DIAGNOSIS — R809 Proteinuria, unspecified: Secondary | ICD-10-CM

## 2019-12-09 DIAGNOSIS — E162 Hypoglycemia, unspecified: Secondary | ICD-10-CM

## 2019-12-09 DIAGNOSIS — I10 Essential (primary) hypertension: Secondary | ICD-10-CM

## 2019-12-09 DIAGNOSIS — Z6841 Body Mass Index (BMI) 40.0 and over, adult: Secondary | ICD-10-CM

## 2019-12-09 DIAGNOSIS — E66813 Obesity, class 3: Secondary | ICD-10-CM

## 2019-12-09 DIAGNOSIS — R8290 Unspecified abnormal findings in urine: Secondary | ICD-10-CM

## 2019-12-09 HISTORY — DX: Proteinuria, unspecified: R80.9

## 2019-12-09 LAB — POCT URINALYSIS DIPSTICK
Bilirubin, UA: NEGATIVE
Glucose, UA: NEGATIVE
Ketones, UA: NEGATIVE
Nitrite, UA: NEGATIVE
Protein, UA: POSITIVE — AB
Spec Grav, UA: 1.025 (ref 1.010–1.025)
Urobilinogen, UA: 0.2 E.U./dL
pH, UA: 5.5 (ref 5.0–8.0)

## 2019-12-09 LAB — GLUCOSE, POCT (MANUAL RESULT ENTRY): POC Glucose: 143 mg/dl — AB (ref 70–99)

## 2019-12-09 MED ORDER — LANTUS SOLOSTAR 100 UNIT/ML ~~LOC~~ SOPN: 35.0000 [IU] | PEN_INJECTOR | Freq: Every day | SUBCUTANEOUS | 99 refills | Status: DC

## 2019-12-09 MED ORDER — DOXYCYCLINE HYCLATE 100 MG PO TABS: 100.0000 mg | ORAL_TABLET | Freq: Two times a day (BID) | ORAL | 0 refills | Status: AC

## 2019-12-09 NOTE — Progress Notes (Signed)
Patient [facility] Follow Up  Subjective:  Patient ID: Tiffany Le, female    DOB: Sep 04, 1975  Age: 44 y.o. MRN: [id number]  CC:  Chief Complaint  Patient presents with  . Follow-up    DM  . Hospitalization Follow-up    12/01/2019---Pyelonephritis     HPI Tiffany Le is a 44 year old female who presents for Hospital Follow Up today.   Past Medical History:  Diagnosis Date  . Diabetes mellitus without complication ([place])   . Hyperlipidemia age 49  . Hypertension age 32  . Iron deficiency anemia   . Obesity    Current Status: Since her last office visit, she has had Hospital Admission for Pyelonephritis on 12/01/2019-12/04/2019. Today, She is doing well with no complaints.  She denies visual changes, chest pain, cough, shortness of breath, heart palpitations, and falls. She has occasional headaches and dizziness with position changes. Denies severe headaches, confusion, seizures, double vision, and blurred vision, nausea and vomiting. She has not been monitoring her pre and post prantal blood glucose levels. She denies fatigue, frequent urination, blurred vision, excessive hunger, excessive thirst, weight gain, weight loss, and poor wound healing. She continues to check her feet regularly. She denies fevers, chills, recent infections, weight loss, and night sweats. Denies GI problems such as diarrhea, and constipation. He has no reports of blood in stools, dysuria and hematuria. No depression or anxiety reported today She denies suicidal ideations, homicidal ideations, or auditory hallucinations. He is taking all medications as prescribed. He denies pain today.   Past Surgical History:  Procedure Laterality Date  . CESAREAN SECTION  3/96  . ORIF FEMUR FRACTURE      Family History  Problem Relation Age of Onset  . Heart disease Mother        CABG <50  . Hypertension Mother   . Diabetes Mother   . Hyperlipidemia Mother   .  Heart disease Father   . Hyperlipidemia Father   . Hypertension Father   . Early death Father   . Healthy Daughter   . Diabetes Maternal Aunt   . Cancer Maternal Grandmother        stomach cancer  . Diabetes Maternal Aunt   . Cancer Cousin        colon cancer (dx'd 55)    Social History   Socioeconomic History  . Marital status: Single    Spouse name: Not on file  . Number of children: Not on file  . Years of education: Not on file  . Highest education level: Not on file  Occupational History  . Occupation: Event organiser  Tobacco Use  . Smoking status: Never Smoker  . Smokeless tobacco: Never Used  Substance and Sexual Activity  . Alcohol use: No  . Drug use: No  . Sexual activity: Not Currently    Partners: Male    Comment: husband currently in [place], returning 02/2013  Other Topics Concern  . 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  Lives with 57 year old daughter, 1 cat Husband is living in [place] (citizen there), trying to come to [place] (has been delayed)   Social Determinants of Radio broadcast assistant Strain:   . Difficulty of Paying Living Expenses:   Food Insecurity:   . Worried About Charity fundraiser in the Last Year:   . [employer]  of Food in the Last Year:   Transportation Needs:   . Film/video editor (Medical):   Marland Kitchen Lack of Transportation (Non-Medical):   Physical Activity:   . Days of Exercise per Week:   . Minutes of Exercise per Session:   Stress:   . Feeling of Stress :   Social Connections:   . Frequency of Communication with Friends and Family:   . Frequency of Social Gatherings with Friends and Family:   . Attends Religious Services:   . Active Member of Clubs or Organizations:   . Attends Archivist Meetings:   Marland Kitchen Marital Status:   Intimate Partner Violence:   . Fear of Current or Ex-Partner:   . Emotionally Abused:   Marland Kitchen Physically Abused:   . Sexually  Abused:     Outpatient Medications Prior to Visit  Medication Sig Dispense Refill  . albuterol (PROVENTIL HFA) 108 (90 Base) MCG/ACT inhaler Inhale 2 puffs into the lungs every 6 (six) hours as needed for wheezing. 1 Inhaler 6  . amLODipine (NORVASC) 10 MG tablet Take 1 tablet (10 mg total) by mouth daily. 30 tablet 0  . aspirin EC 81 MG tablet Take 81 mg by mouth daily.    Marland Kitchen atorvastatin (LIPITOR) 10 MG tablet Take 1 tablet (10 mg total) by mouth daily. 30 tablet 0  . cephALEXin (KEFLEX) 500 MG capsule Take 1 capsule (500 mg total) by mouth 4 (four) times daily for 8 days. 32 capsule 0  . cyclobenzaprine (FLEXERIL) 10 MG tablet Take 1 tablet (10 mg total) by mouth 2 (two) times daily as needed for muscle spasms. 30 tablet 3  . glipiZIDE (GLUCOTROL) 10 MG tablet TAKE 1 TABLET BY MOUTH TWICE DAILY BEFORE A MEAL 60 tablet 0  . hydrochlorothiazide (HYDRODIURIL) 25 MG tablet Take 1 tablet (25 mg total) by mouth daily. 30 tablet 0  . insulin glargine (LANTUS) 100 UNIT/ML Solostar Pen Inject 8 Units into the skin daily. 3 mL 0  . Insulin Pen Needle 29G X 5MM MISC Use with insulin 100 each 0  . lisinopril (ZESTRIL) 10 MG tablet Take 1 tablet (10 mg total) by mouth daily. 30 tablet 0   No facility-administered medications prior to visit.    Allergies  Allergen Reactions  . Penicillins Other (See Comments)    Historical Has patient had a PCN reaction causing immediate rash, facial/tongue/throat swelling, SOB or lightheadedness with hypotension: No Has patient had a PCN reaction causing severe rash involving mucus membranes or skin necrosis: No Has patient had a PCN reaction that required hospitalization: No Has patient had a PCN reaction occurring within the last 10 years: No If all of the above answers are "NO", then may proceed with Cephalosporin use.    ROS Review of Systems  Constitutional: Negative.   HENT: Negative.   Eyes: Positive for visual disturbance (blurry ).  Respiratory:  Negative.   Cardiovascular: Negative.   Gastrointestinal: Positive for abdominal distention.  Endocrine: Negative.   Genitourinary: Negative.   Musculoskeletal: Positive for arthralgias (generalized).  Skin: Negative.   Allergic/Immunologic: Negative.   Neurological: Negative.   Hematological: Negative.   Psychiatric/Behavioral: Negative.       Objective:    Physical Exam  Constitutional: She is oriented to person, place, and time. She appears well-developed and well-nourished.  HENT:  Head: Normocephalic and atraumatic.  Eyes: Conjunctivae are normal.  Cardiovascular: Normal rate, regular rhythm, normal heart sounds and intact distal pulses.  Pulmonary/Chest: Effort normal and breath sounds normal.  Abdominal: Soft. Bowel sounds are normal. She exhibits distension.  Musculoskeletal:        General: Normal range of motion.     Cervical back: Normal range of motion and neck supple.  Neurological: She is alert and oriented to person, place, and time. She has normal reflexes.  Skin: Skin is warm and dry.  Psychiatric: She has a normal mood and affect. Her behavior is normal. Judgment and thought content normal.  Nursing note and vitals reviewed.   BP 137/76   Pulse 85   Temp 98.7 F (37.1 C)   Ht 5\' 4"  (1.626 m)   Wt 243 lb 6.4 oz (110.4 kg)   LMP 12/03/2019   SpO2 100%   BMI 41.78 kg/m  Wt Readings from Last 3 Encounters:  12/09/19 243 lb 6.4 oz (110.4 kg)  12/02/19 245 lb 9.5 oz (111.4 kg)  09/07/19 247 lb 3.2 oz (112.1 kg)     Health Maintenance Due  Topic Date Due  . PNEUMOCOCCAL POLYSACCHARIDE VACCINE AGE 60-64 HIGH RISK  Never done  . OPHTHALMOLOGY EXAM  Never done  . COVID-19 Vaccine (1) Never done  . TETANUS/TDAP  Never done  . PAP SMEAR-Modifier  Never done  . FOOT EXAM  01/13/2015    There are no preventive care reminders to display for this patient.  Lab Results  Component Value Date   TSH 2.030 10/19/2018   Lab Results  Component Value Date    WBC 10.8 (H) 12/03/2019   HGB 11.1 (L) 12/03/2019   HCT 35.2 (L) 12/03/2019   MCV 84.6 12/03/2019   PLT 261 12/03/2019   Lab Results  Component Value Date   NA 136 12/04/2019   K 4.1 12/04/2019   CO2 23 12/04/2019   GLUCOSE 170 (H) 12/04/2019   BUN 14 12/04/2019   CREATININE 0.54 12/04/2019   BILITOT 0.6 12/02/2019   ALKPHOS 79 12/02/2019   AST 28 12/02/2019   ALT 50 (H) 12/02/2019   PROT 8.4 (H) 12/02/2019   ALBUMIN 3.5 12/02/2019   CALCIUM 8.3 (L) 12/04/2019   ANIONGAP 10 12/04/2019   Lab Results  Component Value Date   CHOL 248 (H) 10/19/2018   Lab Results  Component Value Date   HDL 39 (L) 10/19/2018   Lab Results  Component Value Date   LDLCALC 190 (H) 10/19/2018   Lab Results  Component Value Date   TRIG 96 10/19/2018   Lab Results  Component Value Date   CHOLHDL 6.4 (H) 10/19/2018   Lab Results  Component Value Date   HGBA1C 11.8 (H) 12/02/2019    Assessment & Plan:   1. Hospital discharge follow-up  2. Type 2 diabetes mellitus with other specified complication, without long-term current use of insulin (HCC) ** will continue medication as prescribed, to decrease foods/beverages high in sugars and carbs and follow Heart Healthy or DASH diet. Increase physical activity to at least 30 minutes cardio exercise daily. Monitor. - POCT urinalysis dipstick - POCT glucose (manual entry) - insulin glargine (LANTUS SOLOSTAR) 100 UNIT/ML Solostar Pen; Inject 35 Units into the skin daily.  Dispense: 5 pen; Refill: PRN - CBC with Differential - Comprehensive metabolic panel - Lipid Panel - TSH - Vitamin B12 - Vitamin D, 25-hydroxy  3. Hemoglobin A1C greater than 9%, indicating poor diabetic control We will increased Lantus to 35 units day.  - insulin glargine (LANTUS SOLOSTAR) 100 UNIT/ML Solostar Pen; Inject 35 Units into the skin daily.  Dispense: 5 pen; Refill: PRN  4. Hypoglycemia  5. Abnormal urinalysis Results are pending.  - Urine  Culture  6. Proteinuria, unspecified type - Microalbumin/Creatinine Ratio, Urine  7. Acute cystitis without hematuria We will initiate Doxycycline today.  - doxycycline (VIBRA-TABS) 100 MG tablet; Take 1 tablet (100 mg total) by mouth 2 (two) times daily for 10 days.  Dispense: 20 tablet; Refill: 0  8. Class 3 severe obesity due to excess calories with serious comorbidity and body mass index (BMI) of 40.0 to 44.9 in adult [facility]) Body mass index is 41.78 kg/m. Goal BMI  is <30. Encouraged efforts to reduce weight include engaging in physical activity as tolerated with goal of 150 minutes per week. Improve dietary choices and eat a meal regimen consistent with a Mediterranean or DASH diet. Reduce simple carbohydrates. Do not skip meals and eat healthy snacks throughout the day to avoid over-eating at dinner. Set a goal weight loss that is achievable for you.  9. Essential hypertension, benign The current medical regimen is effective; she is stable at 113/76 today; continue present plan and medications as prescribed. She will continue to take medications as prescribed, to decrease high sodium intake, excessive alcohol intake, increase potassium intake, smoking cessation, and increase physical activity of at least 30 minutes of cardio activity daily. She will continue to follow Heart Healthy or DASH diet.  10. Follow up She will follow up in 2 weeks.  Meds ordered this encounter  Medications  . doxycycline (VIBRA-TABS) 100 MG tablet    Sig: Take 1 tablet (100 mg total) by mouth 2 (two) times daily for 10 days.    Dispense:  20 tablet    Refill:  0  . insulin glargine (LANTUS SOLOSTAR) 100 UNIT/ML Solostar Pen    Sig: Inject 35 Units into the skin daily.    Dispense:  5 pen    Refill:  PRN    Orders Placed This Encounter  Procedures  . Urine Culture  . Microalbumin/Creatinine Ratio, Urine  . CBC with Differential  . Comprehensive metabolic panel  . Lipid Panel  . TSH  . Vitamin B12   . Vitamin D, 25-hydroxy  . POCT urinalysis dipstick  . POCT glucose (manual entry)    Referral Orders  No referral(s) requested today   Kathe Becton,  MSN, FNP-BC [address] [place], [place] [address] [phone]- fax  Problem List Items Addressed This Visit    None    Visit Diagnoses    Type 2 diabetes mellitus with other specified complication, without long-term current use of insulin ([place])    -  Primary   Relevant Orders   POCT urinalysis dipstick (Completed)   POCT glucose (manual entry) (Completed)      No orders of the defined types were placed in this encounter.   Follow-up: No follow-ups on file.    Azzie Glatter, FNP

## 2019-12-10 LAB — CBC WITH DIFFERENTIAL/PLATELET
Basophils Absolute: 0 10*3/uL (ref 0.0–0.2)
Basos: 1 %
EOS (ABSOLUTE): 0.1 10*3/uL (ref 0.0–0.4)
Eos: 1 %
Hematocrit: 36.6 % (ref 34.0–46.6)
Hemoglobin: 12.2 g/dL (ref 11.1–15.9)
Immature Grans (Abs): 0 10*3/uL (ref 0.0–0.1)
Immature Granulocytes: 1 %
Lymphocytes Absolute: 2.6 10*3/uL (ref 0.7–3.1)
Lymphs: 40 %
MCH: 27.7 pg (ref 26.6–33.0)
MCHC: 33.3 g/dL (ref 31.5–35.7)
MCV: 83 fL (ref 79–97)
Monocytes Absolute: 0.7 10*3/uL (ref 0.1–0.9)
Monocytes: 11 %
Neutrophils Absolute: 3 10*3/uL (ref 1.4–7.0)
Neutrophils: 46 %
Platelets: 412 10*3/uL (ref 150–450)
RBC: 4.4 x10E6/uL (ref 3.77–5.28)
RDW: 12.5 % (ref 11.7–15.4)
WBC: 6.4 10*3/uL (ref 3.4–10.8)

## 2019-12-10 LAB — COMPREHENSIVE METABOLIC PANEL
ALT: 41 IU/L — ABNORMAL HIGH (ref 0–32)
AST: 26 IU/L (ref 0–40)
Albumin/Globulin Ratio: 1.1 — ABNORMAL LOW (ref 1.2–2.2)
Albumin: 4 g/dL (ref 3.8–4.8)
Alkaline Phosphatase: 80 IU/L (ref 39–117)
BUN/Creatinine Ratio: 20 (ref 9–23)
BUN: 12 mg/dL (ref 6–24)
Bilirubin Total: 0.2 mg/dL (ref 0.0–1.2)
CO2: 24 mmol/L (ref 20–29)
Calcium: 9.5 mg/dL (ref 8.7–10.2)
Chloride: 99 mmol/L (ref 96–106)
Creatinine, Ser: 0.59 mg/dL (ref 0.57–1.00)
GFR calc Af Amer: 130 mL/min/{1.73_m2} (ref >59)
GFR calc non Af Amer: 113 mL/min/{1.73_m2} (ref >59)
Globulin, Total: 3.6 g/dL (ref 1.5–4.5)
Glucose: 121 mg/dL — ABNORMAL HIGH (ref 65–99)
Potassium: 3.7 mmol/L (ref 3.5–5.2)
Sodium: 138 mmol/L (ref 134–144)
Total Protein: 7.6 g/dL (ref 6.0–8.5)

## 2019-12-10 LAB — MICROALBUMIN / CREATININE URINE RATIO
Creatinine, Urine: 139.8 mg/dL
Microalb/Creat Ratio: 99 mg/g creat — ABNORMAL HIGH (ref 0–29)
Microalbumin, Urine: 138.7 ug/mL

## 2019-12-10 LAB — LIPID PANEL
Chol/HDL Ratio: 5.3 ratio — ABNORMAL HIGH (ref 0.0–4.4)
Cholesterol, Total: 165 mg/dL (ref 100–199)
HDL: 31 mg/dL — ABNORMAL LOW (ref >39)
LDL Chol Calc (NIH): 123 mg/dL — ABNORMAL HIGH (ref 0–99)
Triglycerides: 56 mg/dL (ref 0–149)
VLDL Cholesterol Cal: 11 mg/dL (ref 5–40)

## 2019-12-10 LAB — VITAMIN D 25 HYDROXY (VIT D DEFICIENCY, FRACTURES): Vit D, 25-Hydroxy: 6.9 ng/mL — ABNORMAL LOW (ref 30.0–100.0)

## 2019-12-10 LAB — TSH: TSH: 1.61 u[IU]/mL (ref 0.450–4.500)

## 2019-12-10 LAB — VITAMIN B12: Vitamin B-12: 492 pg/mL (ref 232–1245)

## 2019-12-13 LAB — URINE CULTURE

## 2019-12-15 MED FILL — !LANTUS SOLOSTAR 100UNITS/M: 100 | 25 days supply | Qty: 9 | Fill #0

## 2019-12-19 ENCOUNTER — Telehealth: Payer: Self-pay | Admitting: Family Medicine

## 2019-12-19 NOTE — Telephone Encounter (Signed)
Pt experiencing foot swelling after being on new medicine. Pt wants to know what to do and if they need to make an appointment

## 2019-12-20 NOTE — Telephone Encounter (Signed)
Tiffany Le c/o both of her feet  swollen after taking Lisinopril. The right was so swollen yesterday, she could hardly move her toes. Please advise.   (Lisinopril last taken this morning).  Call back ph# [phone].

## 2019-12-23 ENCOUNTER — Ambulatory Visit (INDEPENDENT_AMBULATORY_CARE_PROVIDER_SITE_OTHER): Payer: Self-pay | Admitting: Family Medicine

## 2019-12-23 ENCOUNTER — Encounter: Payer: Self-pay | Admitting: Family Medicine

## 2019-12-23 ENCOUNTER — Other Ambulatory Visit: Payer: Self-pay

## 2019-12-23 VITALS — BP 131/63 | HR 98 | Temp 99.1°F | Ht 64.0 in | Wt 248.2 lb

## 2019-12-23 DIAGNOSIS — E7800 Pure hypercholesterolemia, unspecified: Secondary | ICD-10-CM

## 2019-12-23 DIAGNOSIS — R0602 Shortness of breath: Secondary | ICD-10-CM

## 2019-12-23 DIAGNOSIS — Z09 Encounter for follow-up examination after completed treatment for conditions other than malignant neoplasm: Secondary | ICD-10-CM

## 2019-12-23 DIAGNOSIS — Z131 Encounter for screening for diabetes mellitus: Secondary | ICD-10-CM

## 2019-12-23 DIAGNOSIS — E1169 Type 2 diabetes mellitus with other specified complication: Secondary | ICD-10-CM

## 2019-12-23 DIAGNOSIS — R739 Hyperglycemia, unspecified: Secondary | ICD-10-CM

## 2019-12-23 DIAGNOSIS — E66813 Obesity, class 3: Secondary | ICD-10-CM

## 2019-12-23 DIAGNOSIS — I10 Essential (primary) hypertension: Secondary | ICD-10-CM

## 2019-12-23 DIAGNOSIS — E559 Vitamin D deficiency, unspecified: Secondary | ICD-10-CM

## 2019-12-23 DIAGNOSIS — R7309 Other abnormal glucose: Secondary | ICD-10-CM

## 2019-12-23 DIAGNOSIS — Z6841 Body Mass Index (BMI) 40.0 and over, adult: Secondary | ICD-10-CM

## 2019-12-23 DIAGNOSIS — M549 Dorsalgia, unspecified: Secondary | ICD-10-CM

## 2019-12-23 LAB — POCT URINALYSIS DIPSTICK
Bilirubin, UA: NEGATIVE
Glucose, UA: POSITIVE — AB
Ketones, UA: NEGATIVE
Nitrite, UA: NEGATIVE
Protein, UA: POSITIVE — AB
Spec Grav, UA: 1.02 (ref 1.010–1.025)
Urobilinogen, UA: 0.2 E.U./dL
pH, UA: 5 (ref 5.0–8.0)

## 2019-12-23 LAB — GLUCOSE, POCT (MANUAL RESULT ENTRY): POC Glucose: 309 mg/dl — AB (ref 70–99)

## 2019-12-23 MED ORDER — ALBUTEROL SULFATE HFA 108 (90 BASE) MCG/ACT IN AERS: 2.0000 | INHALATION_SPRAY | Freq: Four times a day (QID) | RESPIRATORY_TRACT | 12 refills | Status: DC | PRN

## 2019-12-23 MED ORDER — VITAMIN D (ERGOCALCIFEROL) 1.25 MG (50000 UNIT) PO CAPS: 50000.0000 [IU] | ORAL_CAPSULE | ORAL | 6 refills | Status: DC

## 2019-12-23 NOTE — Progress Notes (Signed)
Patient [facility]   Established Patient Office Visit  Subjective:  Patient ID: Tiffany Le, female    DOB: April 22, 1976  Age: 44 y.o. MRN: [id number]  CC:  Chief Complaint  Patient presents with  . Foot Swelling  . Follow-up    DM    HPI Tiffany Le is a 44 year old female who presents for Follow Up today.   Past Medical History:  Diagnosis Date  . Diabetes mellitus without complication ([place])   . Hyperlipidemia age 18  . Hypertension age 52  . Hypocalcemia 11/2019  . Hypokalemia 11/2019  . Iron deficiency anemia   . Obesity   . Proteinuria 12/09/2019   Current Status: Since her last office visit, she is doing well with no complaints. She has been taking her blood glucose levels regularly lately. She has seen low range of 102 and high of 300 since his last office visit. She denies fatigue, frequent urination, blurred vision, excessive hunger, excessive thirst, weight gain, weight loss, and poor wound healing. She continues to check her feet regularly. She denies visual changes, chest pain, cough, shortness of breath, heart palpitations, and falls. She has occasional headaches and dizziness with position changes. Denies severe headaches, confusion, seizures, double vision, and blurred vision, nausea and vomiting. She denies fevers, chills, recent infections, weight loss, and night sweats. Denies GI problems such as vomiting and diarrhea. She has no reports of blood in stools, dysuria and hematuria. No depression or anxiety reported today. She denies suicidal ideations, homicidal ideations, or auditory hallucinations. She is takieng all medications as prescribed. She denies pain today.   Past Surgical History:  Procedure Laterality Date  . CESAREAN SECTION  3/96  . ORIF FEMUR FRACTURE      Family History  Problem Relation Age of Onset  . Heart disease Mother        CABG <50  . Hypertension Mother   . Diabetes Mother   .  Hyperlipidemia Mother   . Heart disease Father   . Hyperlipidemia Father   . Hypertension Father   . Early death Father   . Healthy Daughter   . Diabetes Maternal Aunt   . Cancer Maternal Grandmother        stomach cancer  . Diabetes Maternal Aunt   . Cancer Cousin        colon cancer (dx'd 5)    Social History   Socioeconomic History  . Marital status: Single    Spouse name: Not on file  . Number of children: Not on file  . Years of education: Not on file  . Highest education level: Not on file  Occupational History  . Occupation: Event organiser  Tobacco Use  . Smoking status: Never Smoker  . Smokeless tobacco: Never Used  Substance and Sexual Activity  . Alcohol use: No  . Drug use: No  . Sexual activity: Not Currently    Partners: Male    Comment: husband currently in [place], returning 02/2013  Other Topics Concern  . 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  Lives with 5 year old daughter, 1 cat Husband is living in [place] (citizen there), trying to come to [place] (has been delayed)   Social Determinants of Radio broadcast assistant Strain:   . Difficulty of Paying Living Expenses:   Food Insecurity:   . Worried About Estate manager/land agent  of Food in the Last Year:   . [place] in the Last Year:   Transportation Needs:   . Lack of Transportation (Medical):   Marland Kitchen Lack of Transportation (Non-Medical):   Physical Activity:   . Days of Exercise per Week:   . Minutes of Exercise per Session:   Stress:   . Feeling of Stress :   Social Connections:   . Frequency of Communication with Friends and Family:   . Frequency of Social Gatherings with Friends and Family:   . Attends Religious Services:   . Active Member of Clubs or Organizations:   . Attends Archivist Meetings:   Marland Kitchen Marital Status:   Intimate Partner Violence:   . Fear of Current or Ex-Partner:   . Emotionally Abused:   Marland Kitchen  Physically Abused:   . Sexually Abused:     Outpatient Medications Prior to Visit  Medication Sig Dispense Refill  . amLODipine (NORVASC) 10 MG tablet Take 1 tablet (10 mg total) by mouth daily. 30 tablet 0  . aspirin EC 81 MG tablet Take 81 mg by mouth daily.    Marland Kitchen atorvastatin (LIPITOR) 10 MG tablet Take 1 tablet (10 mg total) by mouth daily. 30 tablet 0  . cyclobenzaprine (FLEXERIL) 10 MG tablet Take 1 tablet (10 mg total) by mouth 2 (two) times daily as needed for muscle spasms. 30 tablet 3  . glipiZIDE (GLUCOTROL) 10 MG tablet TAKE 1 TABLET BY MOUTH TWICE DAILY BEFORE A MEAL 60 tablet 0  . hydrochlorothiazide (HYDRODIURIL) 25 MG tablet Take 1 tablet (25 mg total) by mouth daily. 30 tablet 0  . insulin glargine (LANTUS SOLOSTAR) 100 UNIT/ML Solostar Pen Inject 35 Units into the skin daily. 5 pen PRN  . Insulin Pen Needle 29G X 5MM MISC Use with insulin 100 each 0  . lisinopril (ZESTRIL) 10 MG tablet Take 1 tablet (10 mg total) by mouth daily. 30 tablet 0  . albuterol (PROVENTIL HFA) 108 (90 Base) MCG/ACT inhaler Inhale 2 puffs into the lungs every 6 (six) hours as needed for wheezing. 1 Inhaler 6   No facility-administered medications prior to visit.    Allergies  Allergen Reactions  . Penicillins Other (See Comments)    Historical Has patient had a PCN reaction causing immediate rash, facial/tongue/throat swelling, SOB or lightheadedness with hypotension: No Has patient had a PCN reaction causing severe rash involving mucus membranes or skin necrosis: No Has patient had a PCN reaction that required hospitalization: No Has patient had a PCN reaction occurring within the last 10 years: No If all of the above answers are "NO", then may proceed with Cephalosporin use.    ROS Review of Systems  Constitutional: Negative.   HENT: Negative.   Eyes: Negative.   Respiratory: Negative.   Cardiovascular: Negative.   Gastrointestinal: Positive for abdominal distention.  Endocrine:  Negative.   Genitourinary: Negative.   Musculoskeletal: Negative.   Skin: Negative.   Allergic/Immunologic: Negative.   Neurological: Positive for dizziness (occasional ) and headaches (occasional ).  Hematological: Negative.   Psychiatric/Behavioral: Negative.       Objective:    Physical Exam  Constitutional: She is oriented to person, place, and time. She appears well-developed and well-nourished.  HENT:  Head: Normocephalic and atraumatic.  Eyes: Conjunctivae are normal.  Cardiovascular: Normal rate, regular rhythm, normal heart sounds and intact distal pulses.  Pulmonary/Chest: Effort normal and breath sounds normal.  Abdominal: Soft. Bowel sounds are normal. She exhibits distension (obese).  Musculoskeletal:        General: Edema (bilateral lower extremity edema) present.     Cervical back: Normal range of motion and neck supple.  Neurological: She is alert and oriented to person, place, and time. She has normal reflexes.  Skin: Skin is warm and dry.  Psychiatric: She has a normal mood and affect. Her behavior is normal. Judgment and thought content normal.  Nursing note and vitals reviewed.   BP 131/63   Pulse 98   Temp 99.1 F (37.3 C)   Ht 5\' 4"  (1.626 m)   Wt 248 lb 3.2 oz (112.6 kg)   LMP 12/03/2019   BMI 42.60 kg/m  Wt Readings from Last 3 Encounters:  12/23/19 248 lb 3.2 oz (112.6 kg)  12/09/19 243 lb 6.4 oz (110.4 kg)  12/02/19 245 lb 9.5 oz (111.4 kg)     Health Maintenance Due  Topic Date Due  . PNEUMOCOCCAL POLYSACCHARIDE VACCINE AGE 85-64 HIGH RISK  Never done  . OPHTHALMOLOGY EXAM  Never done  . COVID-19 Vaccine (1) Never done  . TETANUS/TDAP  Never done  . PAP SMEAR-Modifier  Never done  . FOOT EXAM  01/13/2015    There are no preventive care reminders to display for this patient.  Lab Results  Component Value Date   TSH 1.610 12/09/2019   Lab Results  Component Value Date   WBC 6.4 12/09/2019   HGB 12.2 12/09/2019   HCT 36.6  12/09/2019   MCV 83 12/09/2019   PLT 412 12/09/2019   Lab Results  Component Value Date   NA 138 12/09/2019   K 3.7 12/09/2019   CO2 24 12/09/2019   GLUCOSE 121 (H) 12/09/2019   BUN 12 12/09/2019   CREATININE 0.59 12/09/2019   BILITOT 0.2 12/09/2019   ALKPHOS 80 12/09/2019   AST 26 12/09/2019   ALT 41 (H) 12/09/2019   PROT 7.6 12/09/2019   ALBUMIN 4.0 12/09/2019   CALCIUM 9.5 12/09/2019   ANIONGAP 10 12/04/2019   Lab Results  Component Value Date   CHOL 165 12/09/2019   Lab Results  Component Value Date   HDL 31 (L) 12/09/2019   Lab Results  Component Value Date   LDLCALC 123 (H) 12/09/2019   Lab Results  Component Value Date   TRIG 56 12/09/2019   Lab Results  Component Value Date   CHOLHDL 5.3 (H) 12/09/2019   Lab Results  Component Value Date   HGBA1C 11.8 (H) 12/02/2019      Assessment & Plan:   1. Type 2 diabetes mellitus with other specified complication, without long-term current use of insulin (HCC) She will continue medication as prescribed, to decrease foods/beverages high in sugars and carbs and follow Heart Healthy or DASH diet. Increase physical activity to at least 30 minutes cardio exercise daily.  - POCT urinalysis dipstick - POCT glucose (manual entry)  2. Hemoglobin A1C greater than 9%, indicating poor diabetic control Hgb A1c increased at 11.8 on 12/02/2019. Monitor.   3. Hyperglycemia  4. Class 3 severe obesity due to excess calories with serious comorbidity and body mass index (BMI) of 40.0 to 44.9 in adult [facility]) Body mass index is 42.6 kg/m.  Goal BMI  is <30. Encouraged efforts to reduce weight include engaging in physical activity as tolerated with goal of 150 minutes per week. Improve dietary choices and eat a meal regimen consistent with a Mediterranean or DASH diet. Reduce simple carbohydrates. Do not skip meals and eat healthy snacks throughout the day  to avoid Le-eating at dinner. Set a goal weight loss that is achievable for  you.  5. Shortness of breath No signs or symptoms of respiratory distress noted or reported today.  - albuterol (PROVENTIL HFA) 108 (90 Base) MCG/ACT inhaler; Inhale 2 puffs into the lungs every 6 (six) hours as needed for wheezing.  Dispense: 18 g; Refill: 12  6. Vitamin D deficiency - Vitamin D, Ergocalciferol, (DRISDOL) 1.25 MG (50000 UNIT) CAPS capsule; Take 1 capsule (50,000 Units total) by mouth every 7 (seven) days.  Dispense: 5 capsule; Refill: 6  7. Follow up She will follow up in 02/2020.  Meds ordered this encounter  Medications  . albuterol (PROVENTIL HFA) 108 (90 Base) MCG/ACT inhaler    Sig: Inhale 2 puffs into the lungs every 6 (six) hours as needed for wheezing.    Dispense:  18 g    Refill:  12  . Vitamin D, Ergocalciferol, (DRISDOL) 1.25 MG (50000 UNIT) CAPS capsule    Sig: Take 1 capsule (50,000 Units total) by mouth every 7 (seven) days.    Dispense:  5 capsule    Refill:  6    Orders Placed This Encounter  Procedures  . POCT urinalysis dipstick  . POCT glucose (manual entry)    Referral Orders  No referral(s) requested today    Kathe Becton,  MSN, FNP-BC [address] [place], [place] [address] [phone] fax  Problem List Items Addressed This Visit      Other   Class 3 severe obesity due to excess calories with serious comorbidity and body mass index (BMI) of 40.0 to 44.9 in adult [facility])    Other Visit Diagnoses    Type 2 diabetes mellitus with other specified complication, without long-term current use of insulin ([place])    -  Primary   Relevant Orders   POCT urinalysis dipstick (Completed)   POCT glucose (manual entry) (Completed)   Hemoglobin A1C greater than 9%, indicating poor diabetic control       Hyperglycemia       Shortness of breath       Relevant Medications   albuterol (PROVENTIL HFA) 108 (90 Base) MCG/ACT inhaler   Vitamin D  deficiency       Relevant Medications   Vitamin D, Ergocalciferol, (DRISDOL) 1.25 MG (50000 UNIT) CAPS capsule   Follow up          Meds ordered this encounter  Medications  . albuterol (PROVENTIL HFA) 108 (90 Base) MCG/ACT inhaler    Sig: Inhale 2 puffs into the lungs every 6 (six) hours as needed for wheezing.    Dispense:  18 g    Refill:  12  . Vitamin D, Ergocalciferol, (DRISDOL) 1.25 MG (50000 UNIT) CAPS capsule    Sig: Take 1 capsule (50,000 Units total) by mouth every 7 (seven) days.    Dispense:  5 capsule    Refill:  6    Follow-up: No follow-ups on file.    Azzie Glatter, FNP

## 2019-12-25 DIAGNOSIS — R0602 Shortness of breath: Secondary | ICD-10-CM | POA: Insufficient documentation

## 2019-12-25 DIAGNOSIS — R7309 Other abnormal glucose: Secondary | ICD-10-CM | POA: Insufficient documentation

## 2019-12-25 MED ORDER — GLIPIZIDE 10 MG PO TABS: ORAL_TABLET | ORAL | 11 refills | Status: DC

## 2019-12-25 MED ORDER — AMLODIPINE BESYLATE 10 MG PO TABS: 10.0000 mg | ORAL_TABLET | Freq: Every day | ORAL | 11 refills | Status: DC

## 2019-12-25 MED ORDER — HYDROCHLOROTHIAZIDE 25 MG PO TABS: 25.0000 mg | ORAL_TABLET | Freq: Every day | ORAL | 11 refills | Status: DC

## 2019-12-25 MED ORDER — LISINOPRIL 10 MG PO TABS: 10.0000 mg | ORAL_TABLET | Freq: Every day | ORAL | 11 refills | Status: DC

## 2019-12-25 MED ORDER — ATORVASTATIN CALCIUM 10 MG PO TABS: 10.0000 mg | ORAL_TABLET | Freq: Every day | ORAL | 11 refills | Status: DC

## 2019-12-25 MED ORDER — LANTUS SOLOSTAR 100 UNIT/ML ~~LOC~~ SOPN: 35.0000 [IU] | PEN_INJECTOR | Freq: Every day | SUBCUTANEOUS | 99 refills | Status: DC

## 2019-12-25 MED ORDER — CYCLOBENZAPRINE HCL 10 MG PO TABS: 10.0000 mg | ORAL_TABLET | Freq: Two times a day (BID) | ORAL | 6 refills | Status: DC | PRN

## 2020-01-05 MED FILL — ?BASAGLAR 100 UNITS/ML KWPE: 100 | 25 days supply | Qty: 9 | Fill #1

## 2020-01-25 ENCOUNTER — Other Ambulatory Visit: Payer: Self-pay

## 2020-01-25 ENCOUNTER — Ambulatory Visit: Payer: Self-pay | Attending: Family Medicine

## 2020-01-31 ENCOUNTER — Ambulatory Visit: Payer: Self-pay

## 2020-02-09 DIAGNOSIS — E1140 Type 2 diabetes mellitus with diabetic neuropathy, unspecified: Secondary | ICD-10-CM

## 2020-02-09 HISTORY — DX: Type 2 diabetes mellitus with diabetic neuropathy, unspecified: E11.40

## 2020-02-21 ENCOUNTER — Ambulatory Visit: Payer: Self-pay | Admitting: Family Medicine

## 2020-02-22 ENCOUNTER — Ambulatory Visit: Payer: Self-pay | Admitting: Family Medicine

## 2020-02-28 ENCOUNTER — Encounter: Payer: Self-pay | Admitting: Family Medicine

## 2020-02-28 ENCOUNTER — Ambulatory Visit (INDEPENDENT_AMBULATORY_CARE_PROVIDER_SITE_OTHER): Payer: Self-pay | Admitting: Family Medicine

## 2020-02-28 ENCOUNTER — Other Ambulatory Visit: Payer: Self-pay

## 2020-02-28 VITALS — BP 147/75 | HR 94 | Temp 98.7°F | Resp 18 | Ht 64.0 in | Wt 248.8 lb

## 2020-02-28 DIAGNOSIS — R7309 Other abnormal glucose: Secondary | ICD-10-CM

## 2020-02-28 DIAGNOSIS — Z6841 Body Mass Index (BMI) 40.0 and over, adult: Secondary | ICD-10-CM

## 2020-02-28 DIAGNOSIS — E66813 Obesity, class 3: Secondary | ICD-10-CM

## 2020-02-28 DIAGNOSIS — Z09 Encounter for follow-up examination after completed treatment for conditions other than malignant neoplasm: Secondary | ICD-10-CM

## 2020-02-28 DIAGNOSIS — E1169 Type 2 diabetes mellitus with other specified complication: Secondary | ICD-10-CM

## 2020-02-28 DIAGNOSIS — E1140 Type 2 diabetes mellitus with diabetic neuropathy, unspecified: Secondary | ICD-10-CM

## 2020-02-28 DIAGNOSIS — R739 Hyperglycemia, unspecified: Secondary | ICD-10-CM

## 2020-02-28 LAB — POCT URINALYSIS DIPSTICK
Bilirubin, UA: NEGATIVE
Glucose, UA: POSITIVE — AB
Ketones, UA: NEGATIVE
Nitrite, UA: NEGATIVE
Protein, UA: POSITIVE — AB
Spec Grav, UA: 1.02 (ref 1.010–1.025)
Urobilinogen, UA: 1 E.U./dL
pH, UA: 7 (ref 5.0–8.0)

## 2020-02-28 LAB — POCT GLYCOSYLATED HEMOGLOBIN (HGB A1C)
HbA1c POC (<> result, manual entry): 8.3 % (ref 4.0–5.6)
HbA1c, POC (controlled diabetic range): 8.3 % — AB (ref 0.0–7.0)
HbA1c, POC (prediabetic range): 8.3 % — AB (ref 5.7–6.4)
Hemoglobin A1C: 8.3 % — AB (ref 4.0–5.6)

## 2020-02-28 LAB — GLUCOSE, POCT (MANUAL RESULT ENTRY): POC Glucose: 333 mg/dl — AB (ref 70–99)

## 2020-02-28 MED ORDER — GABAPENTIN 100 MG PO CAPS: 100.0000 mg | ORAL_CAPSULE | Freq: Three times a day (TID) | ORAL | 3 refills | Status: DC

## 2020-02-28 NOTE — Progress Notes (Signed)
Patient [facility]   Established Patient Office Visit  Subjective:  Patient ID: Tiffany Le, female    DOB: 07/21/1976  Age: 44 y.o. MRN: [id number]  CC:  Chief Complaint  Patient presents with  . Follow-up    Pt states she is here for her 3 months f/u. Pt states states same thing is going on. Feet hurt and she is tired.    HPI Tiffany Le is a 44 year old female who presents for Follow Up today.    Patient Active Problem List   Diagnosis Date Noted  . Shortness of breath 12/25/2019  . Hemoglobin A1C greater than 9%, indicating poor diabetic control 12/25/2019  . Sepsis due to urinary tract infection ([place]) 12/03/2019  . Sepsis secondary to UTI ([place]) 12/02/2019  . Class 3 severe obesity due to excess calories with serious comorbidity and body mass index (BMI) of 40.0 to 44.9 in adult ([place]) 10/24/2018  . Back pain 10/24/2018  . Elevated LFTs 01/13/2014  . Unspecified vitamin D deficiency 01/13/2014  . Uncontrolled type 2 diabetes mellitus with hyperglycemia, without long-term current use of insulin ([place]) 11/03/2013  . Anemia, iron deficiency 11/17/2012  . Essential hypertension, benign 11/17/2012  . Pure hypercholesterolemia 11/17/2012    Past Medical History:  Diagnosis Date  . Diabetes mellitus without complication ([place])   . Diabetic neuropathy ([place]) 02/2020  . Hyperlipidemia age 27  . Hypertension age 41  . Hypocalcemia 11/2019  . Hypokalemia 11/2019  . Iron deficiency anemia   . Obesity   . Proteinuria 12/09/2019    Past Surgical History:  Procedure Laterality Date  . CESAREAN SECTION  3/96  . ORIF FEMUR FRACTURE     Current Status: Since her last office visit, she is doing well with no complaints. Her has seen low range  ahigh of 290 since his last office visit. She denies fatigue, frequent urination, blurred vision, excessive hunger, excessive thirst, weight gain, weight loss, and poor wound healing. She  continues to check her feet regularly. She denies visual changes, chest pain, cough, shortness of breath, heart palpitations, and falls. She has occasional headaches and dizziness with position changes. Denies severe headaches, confusion, seizures, double vision, and blurred vision, nausea and vomiting. She denies fevers, chills, recent infections, weight loss, and night sweats.  Denies GI problems such as diarrhea, and constipation. She has no reports of blood in stools, dysuria and hematuria. No depression or anxiety reported today. She is taking all medications as prescribed. She denies pain today.   Family History  Problem Relation Age of Onset  . Heart disease Mother        CABG <50  . Hypertension Mother   . Diabetes Mother   . Hyperlipidemia Mother   . Heart disease Father   . Hyperlipidemia Father   . Hypertension Father   . Early death Father   . Healthy Daughter   . Diabetes Maternal Aunt   . Cancer Maternal Grandmother        stomach cancer  . Diabetes Maternal Aunt   . Cancer Cousin        colon cancer (dx'd 79)    Social History   Socioeconomic History  . Marital status: Single    Spouse name: Not on file  . Number of children: Not on file  . Years of education: Not on file  . Highest education level: Not on file  Occupational History  . Occupation: Event organiser  Tobacco Use  . Smoking status: Never Smoker  . Smokeless tobacco: Never Used  Vaping Use  . Vaping Use: Never used  Substance and Sexual Activity  . Alcohol use: No  . Drug use: No  . Sexual activity: Not Currently    Partners: Male    Comment: husband currently in [place], returning 02/2013  Other Topics Concern  . 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  Lives with 45 year old daughter, 1 cat Husband is living in [place] (citizen there), trying to come to [place] (has been delayed)   Social Determinants of Systems developer Strain:   . Difficulty of Paying Living Expenses:   Food Insecurity:   . Worried About Charity fundraiser in the Last Year:   . Arboriculturist in the Last Year:   Transportation Needs:   . Film/video editor (Medical):   Marland Kitchen Lack of Transportation (Non-Medical):   Physical Activity:   . Days of Exercise per Week:   . Minutes of Exercise per Session:   Stress:   . Feeling of Stress :   Social Connections:   . Frequency of Communication with Friends and Family:   . Frequency of Social Gatherings with Friends and Family:   . Attends Religious Services:   . Active Member of Clubs or Organizations:   . Attends Archivist Meetings:   Marland Kitchen Marital Status:   Intimate Partner Violence:   . Fear of Current or Ex-Partner:   . Emotionally Abused:   Marland Kitchen Physically Abused:   . Sexually Abused:     Outpatient Medications Prior to Visit  Medication Sig Dispense Refill  . amLODipine (NORVASC) 10 MG tablet Take 1 tablet (10 mg total) by mouth daily. 30 tablet 11  . aspirin EC 81 MG tablet Take 81 mg by mouth daily.    Marland Kitchen atorvastatin (LIPITOR) 10 MG tablet Take 1 tablet (10 mg total) by mouth daily. 30 tablet 11  . cyclobenzaprine (FLEXERIL) 10 MG tablet Take 1 tablet (10 mg total) by mouth 2 (two) times daily as needed for muscle spasms. 30 tablet 6  . glipiZIDE (GLUCOTROL) 10 MG tablet TAKE 1 TABLET BY MOUTH TWICE DAILY BEFORE A MEAL 60 tablet 11  . hydrochlorothiazide (HYDRODIURIL) 25 MG tablet Take 1 tablet (25 mg total) by mouth daily. 30 tablet 11  . insulin glargine (LANTUS SOLOSTAR) 100 UNIT/ML Solostar Pen Inject 35 Units into the skin daily. 5 pen PRN  . Insulin Pen Needle 29G X 5MM MISC Use with insulin 100 each 0  . lisinopril (ZESTRIL) 10 MG tablet Take 1 tablet (10 mg total) by mouth daily. 30 tablet 11  . albuterol (PROVENTIL HFA) 108 (90 Base) MCG/ACT inhaler Inhale 2 puffs into the lungs every 6 (six) hours as needed for wheezing. (Patient not taking: Reported on  02/28/2020) 18 g 12  . Vitamin D, Ergocalciferol, (DRISDOL) 1.25 MG (50000 UNIT) CAPS capsule Take 1 capsule (50,000 Units total) by mouth every 7 (seven) days. (Patient not taking: Reported on 02/28/2020) 5 capsule 6   No facility-administered medications prior to visit.    Allergies  Allergen Reactions  . Penicillins Other (See Comments)    Historical Has patient had a PCN reaction causing immediate rash, facial/tongue/throat swelling, SOB or lightheadedness with hypotension: No Has patient had a PCN reaction causing severe rash involving mucus membranes or skin necrosis:  No Has patient had a PCN reaction that required hospitalization: No Has patient had a PCN reaction occurring within the last 10 years: No If all of the above answers are "NO", then may proceed with Cephalosporin use.    ROS Review of Systems  Constitutional: Negative.   HENT: Negative.   Eyes: Negative.   Respiratory: Negative.   Cardiovascular: Negative.   Gastrointestinal: Positive for abdominal distention.  Endocrine: Negative.   Genitourinary: Negative.   Musculoskeletal: Positive for arthralgias (generalized joint pain).  Skin: Negative.   Allergic/Immunologic: Negative.   Neurological: Positive for dizziness (occasional ) and headaches (occasional ).  Hematological: Negative.   Psychiatric/Behavioral: Negative.       Objective:    Physical Exam Vitals and nursing note reviewed.  Constitutional:      Appearance: Normal appearance.  HENT:     Head: Normocephalic and atraumatic.     Nose: Nose normal.     Mouth/Throat:     Mouth: Mucous membranes are moist.     Pharynx: Oropharynx is clear.  Cardiovascular:     Rate and Rhythm: Normal rate and regular rhythm.     Pulses: Normal pulses.     Heart sounds: Normal heart sounds.  Pulmonary:     Effort: Pulmonary effort is normal.     Breath sounds: Normal breath sounds.  Abdominal:     General: Bowel sounds are normal.     Palpations: Abdomen  is soft.  Musculoskeletal:        General: Normal range of motion.     Cervical back: Normal range of motion and neck supple.  Skin:    General: Skin is warm and dry.  Neurological:     General: No focal deficit present.     Mental Status: She is alert and oriented to person, place, and time.  Psychiatric:        Mood and Affect: Mood normal.        Behavior: Behavior normal.        Thought Content: Thought content normal.        Judgment: Judgment normal.    BP (!) 147/75 (BP Location: Left Arm, Patient Position: Sitting, Cuff Size: Normal)   Pulse 94   Temp 98.7 F (37.1 C)   Resp 18   Ht 5\' 4"  (1.626 m)   Wt 248 lb 12.8 oz (112.9 kg)   LMP 02/22/2020   SpO2 98%   BMI 42.71 kg/m  Wt Readings from Last 3 Encounters:  02/28/20 248 lb 12.8 oz (112.9 kg)  12/23/19 248 lb 3.2 oz (112.6 kg)  12/09/19 243 lb 6.4 oz (110.4 kg)    Health Maintenance Due  Topic Date Due  . Hepatitis C Screening  Never done  . PNEUMOCOCCAL POLYSACCHARIDE VACCINE AGE 3-64 HIGH RISK  Never done  . OPHTHALMOLOGY EXAM  Never done  . COVID-19 Vaccine (1) Never done  . TETANUS/TDAP  Never done  . PAP SMEAR-Modifier  Never done  . FOOT EXAM  01/13/2015    There are no preventive care reminders to display for this patient.  Lab Results  Component Value Date   TSH 1.610 12/09/2019   Lab Results  Component Value Date   WBC 6.4 12/09/2019   HGB 12.2 12/09/2019   HCT 36.6 12/09/2019   MCV 83 12/09/2019   PLT 412 12/09/2019   Lab Results  Component Value Date   NA 138 12/09/2019   K 3.7 12/09/2019   CO2 24 12/09/2019   GLUCOSE 121 (  H) 12/09/2019   BUN 12 12/09/2019   CREATININE 0.59 12/09/2019   BILITOT 0.2 12/09/2019   ALKPHOS 80 12/09/2019   AST 26 12/09/2019   ALT 41 (H) 12/09/2019   PROT 7.6 12/09/2019   ALBUMIN 4.0 12/09/2019   CALCIUM 9.5 12/09/2019   ANIONGAP 10 12/04/2019   Lab Results  Component Value Date   CHOL 165 12/09/2019   Lab Results  Component Value Date    HDL 31 (L) 12/09/2019   Lab Results  Component Value Date   LDLCALC 123 (H) 12/09/2019   Lab Results  Component Value Date   TRIG 56 12/09/2019   Lab Results  Component Value Date   CHOLHDL 5.3 (H) 12/09/2019   Lab Results  Component Value Date   HGBA1C 11.8 (H) 12/02/2019    Assessment & Plan:   1. Type 2 diabetes mellitus with other specified complication, without long-term current use of insulin (HCC) She will continue medication as prescribed, to decrease foods/beverages high in sugars and carbs and follow Heart Healthy or DASH diet. Increase physical activity to at least 30 minutes cardio exercise daily.   2. Hemoglobin A1C greater than 9%, indicating poor diabetic control Improved. Hgb A1c is stable at 8.3 today, from 11.8 on 12/02/2019. Monitor.   3. Hyperglycemia  4. Diabetic neuropathy, painful (HCC) Stable. Not worsening.  - gabapentin (NEURONTIN) 100 MG capsule; Take 1 capsule (100 mg total) by mouth 3 (three) times daily.  Dispense: 90 capsule; Refill: 3  5. Class 3 severe obesity due to excess calories with serious comorbidity and body mass index (BMI) of 40.0 to 44.9 in adult [facility]) Body mass index is 42.71 kg/m. Goal BMI  is <30. Encouraged efforts to reduce weight include engaging in physical activity as tolerated with goal of 150 minutes per week. Improve dietary choices and eat a meal regimen consistent with a Mediterranean or DASH diet. Reduce simple carbohydrates. Do not skip meals and eat healthy snacks throughout the day to avoid over-eating at dinner. Set a goal weight loss that is achievable for you.  6. Follow up She will follow up in 3 months.   Meds ordered this encounter  Medications  . gabapentin (NEURONTIN) 100 MG capsule    Sig: Take 1 capsule (100 mg total) by mouth 3 (three) times daily.    Dispense:  90 capsule    Refill:  3    Orders Placed This Encounter  Procedures  . POC Glucose (CBG)  . POC HgB A1c  . Urinalysis Dipstick     Referral Orders  No referral(s) requested today    Kathe Becton,  MSN, FNP-BC [place] [address] [place], [place] [address] [phone]  Problem List Items Addressed This Visit      Endocrine   Hemoglobin A1C greater than 9%, indicating poor diabetic control     Other   Class 3 severe obesity due to excess calories with serious comorbidity and body mass index (BMI) of 40.0 to 44.9 in adult [facility])    Other Visit Diagnoses    Type 2 diabetes mellitus with other specified complication, without long-term current use of insulin (HCC)    -  Primary   Relevant Orders   POC Glucose (CBG)   POC HgB A1c   Urinalysis Dipstick   Hyperglycemia       Diabetic neuropathy, painful (HCC)       Relevant Medications   gabapentin (  NEURONTIN) 100 MG capsule   Follow up          Meds ordered this encounter  Medications  . gabapentin (NEURONTIN) 100 MG capsule    Sig: Take 1 capsule (100 mg total) by mouth 3 (three) times daily.    Dispense:  90 capsule    Refill:  3    Follow-up: Return in about 3 months (around 05/30/2020).    Azzie Glatter, FNP

## 2020-02-28 NOTE — Patient Instructions (Signed)
Gabapentin capsules or tablets What is this medicine? GABAPENTIN (GA ba pen tin) is used to control seizures in certain types of epilepsy. It is also used to treat certain types of nerve pain. This medicine may be used for other purposes; ask your health care provider or pharmacist if you have questions. COMMON BRAND NAME(S): Active-PAC with Gabapentin, Gabarone, Neurontin What should I tell my health care provider before I take this medicine? They need to know if you have any of these conditions:  history of drug abuse or alcohol abuse problem  kidney disease  lung or breathing disease  suicidal thoughts, plans, or attempt; a previous suicide attempt by you or a family member  an unusual or allergic reaction to gabapentin, other medicines, foods, dyes, or preservatives  pregnant or trying to get pregnant  breast-feeding How should I use this medicine? Take this medicine by mouth with a glass of water. Follow the directions on the prescription label. You can take it with or without food. If it upsets your stomach, take it with food. Take your medicine at regular intervals. Do not take it more often than directed. Do not stop taking except on your doctor's advice. If you are directed to break the 600 or 800 mg tablets in half as part of your dose, the extra half tablet should be used for the next dose. If you have not used the extra half tablet within 28 days, it should be thrown away. A special MedGuide will be given to you by the pharmacist with each prescription and refill. Be sure to read this information carefully each time. Talk to your pediatrician regarding the use of this medicine in children. While this drug may be prescribed for children as young as 3 years for selected conditions, precautions do apply. Overdosage: If you think you have taken too much of this medicine contact a poison control center or emergency room at once. NOTE: This medicine is only for you. Do not share  this medicine with others. What if I miss a dose? If you miss a dose, take it as soon as you can. If it is almost time for your next dose, take only that dose. Do not take double or extra doses. What may interact with this medicine? This medicine may interact with the following medications:  alcohol  antihistamines for allergy, cough, and cold  certain medicines for anxiety or sleep  certain medicines for depression like amitriptyline, fluoxetine, sertraline  certain medicines for seizures like phenobarbital, primidone  certain medicines for stomach problems  general anesthetics like halothane, isoflurane, methoxyflurane, propofol  local anesthetics like lidocaine, pramoxine, tetracaine  medicines that relax muscles for surgery  narcotic medicines for pain  phenothiazines like chlorpromazine, mesoridazine, prochlorperazine, thioridazine This list may not describe all possible interactions. Give your health care provider a list of all the medicines, herbs, non-prescription drugs, or dietary supplements you use. Also tell them if you smoke, drink alcohol, or use illegal drugs. Some items may interact with your medicine. What should I watch for while using this medicine? Visit your doctor or health care provider for regular checks on your progress. You may want to keep a record at home of how you feel your condition is responding to treatment. You may want to share this information with your doctor or health care provider at each visit. You should contact your doctor or health care provider if your seizures get worse or if you have any new types of seizures. Do not stop taking  this medicine or any of your seizure medicines unless instructed by your doctor or health care provider. Stopping your medicine suddenly can increase your seizures or their severity. This medicine may cause serious skin reactions. They can happen weeks to months after starting the medicine. Contact your health care  provider right away if you notice fevers or flu-like symptoms with a rash. The rash may be red or purple and then turn into blisters or peeling of the skin. Or, you might notice a red rash with swelling of the face, lips or lymph nodes in your neck or under your arms. Wear a medical identification bracelet or chain if you are taking this medicine for seizures, and carry a card that lists all your medications. You may get drowsy, dizzy, or have blurred vision. Do not drive, use machinery, or do anything that needs mental alertness until you know how this medicine affects you. To reduce dizzy or fainting spells, do not sit or stand up quickly, especially if you are an older patient. Alcohol can increase drowsiness and dizziness. Avoid alcoholic drinks. Your mouth may get dry. Chewing sugarless gum or sucking hard candy, and drinking plenty of water will help. The use of this medicine may increase the chance of suicidal thoughts or actions. Pay special attention to how you are responding while on this medicine. Any worsening of mood, or thoughts of suicide or dying should be reported to your health care provider right away. Women who become pregnant while using this medicine may enroll in the [place] Pregnancy Registry by calling [phone]. This registry collects information about the safety of antiepileptic drug use during pregnancy. What side effects may I notice from receiving this medicine? Side effects that you should report to your doctor or health care professional as soon as possible:  allergic reactions like skin rash, itching or hives, swelling of the face, lips, or tongue  breathing problems  rash, fever, and swollen lymph nodes  redness, blistering, peeling or loosening of the skin, including inside the mouth  suicidal thoughts, mood changes Side effects that usually do not require medical attention (report to your doctor or health care professional if they  continue or are bothersome):  dizziness  drowsiness  headache  nausea, vomiting  swelling of ankles, feet, hands  tiredness This list may not describe all possible side effects. Call your doctor for medical advice about side effects. You may report side effects to FDA at 1-800-FDA-1088. Where should I keep my medicine? Keep out of reach of children. This medicine may cause accidental overdose and death if it taken by other adults, children, or pets. Mix any unused medicine with a substance like cat litter or coffee grounds. Then throw the medicine away in a sealed container like a sealed bag or a coffee can with a lid. Do not use the medicine after the expiration date. Store at room temperature between 15 and 30 degrees C (59 and 86 degrees F). NOTE: This sheet is a summary. It may not cover all possible information. If you have questions about this medicine, talk to your doctor, pharmacist, or health care provider.  2020 Elsevier/Gold Standard (2018-10-29 14:16:43) Diabetic Neuropathy Diabetic neuropathy refers to nerve damage that is caused by diabetes (diabetes mellitus). Over time, people with diabetes can develop nerve damage throughout the body. There are several types of diabetic neuropathy:  Peripheral neuropathy. This is the most common type of diabetic neuropathy. It causes damage to nerves that carry signals between the  spinal cord and other parts of the body (peripheral nerves). This usually affects nerves in the feet and legs first, and may eventually affect the hands and arms. The damage affects the ability to sense touch or temperature.  Autonomic neuropathy. This type causes damage to nerves that control involuntary functions (autonomic nerves). These nerves carry signals that control: ? Heartbeat. ? Body temperature. ? Blood pressure. ? Urination. ? Digestion. ? Sweating. ? Sexual function. ? Response to changing blood sugar (glucose) levels.  Focal neuropathy.  This type of nerve damage affects one area of the body, such as an arm, a leg, or the face. The injury may involve one nerve or a small group of nerves. Focal neuropathy can be painful and unpredictable, and occurs most often in older adults with diabetes. This often develops suddenly, but usually improves over time and does not cause long-term problems.  Proximal neuropathy. This type of nerve damage affects the nerves of the thighs, hips, buttocks, or legs. It causes severe pain, weakness, and muscle death (atrophy), usually in the thigh muscles. It is more common among older men and people who have type 2 diabetes. The length of recovery time may vary. What are the causes? Peripheral, autonomic, and focal neuropathies are caused by diabetes that is not well controlled with treatment. The cause of proximal neuropathy is not known, but it may be caused by inflammation related to uncontrolled blood glucose levels. What are the signs or symptoms? Peripheral neuropathy Peripheral neuropathy develops slowly over time. When the nerves of the feet and legs no longer work, you may experience:  Burning, stabbing, or aching pain in the legs or feet.  Pain or cramping in the legs or feet.  Loss of feeling (numbness) and inability to feel pressure or pain in the feet. This can lead to: ? Thick calluses or sores on areas of constant pressure. ? Ulcers. ? Reduced ability to feel temperature changes.  Foot deformities.  Muscle weakness.  Loss of balance or coordination. Autonomic neuropathy The symptoms of autonomic neuropathy vary depending on which nerves are affected. Symptoms may include:  Problems with digestion, such as: ? Nausea or vomiting. ? Poor appetite. ? Bloating. ? Diarrhea or constipation. ? Trouble swallowing. ? Losing weight without trying to.  Problems with the heart, blood and lungs, such as: ? Dizziness, especially when standing up. ? Fainting. ? Shortness of  breath. ? Irregular heartbeat.  Bladder problems, such as: ? Trouble starting or stopping urination. ? Leaking urine. ? Trouble emptying the bladder. ? Urinary tract infections (UTIs).  Problems with other body functions, such as: ? Sweat. You may sweat too much or too little. ? Temperature. You might get hot easily. Or, you might feel cold more than usual. ? Sexual function. Men may not be able to get or maintain an erection. Women may have vaginal dryness and difficulty with arousal. Focal neuropathy Symptoms affect only one area of the body. Common symptoms include:  Numbness.  Tingling.  Burning pain.  Prickling feeling.  Very sensitive skin.  Weakness.  Inability to move (paralysis).  Muscle twitching.  Muscles getting smaller (wasting).  Poor coordination.  Double or blurred vision. Proximal neuropathy  Sudden, severe pain in the hip, thigh, or buttocks. Pain may spread from the back into the legs (sciatica).  Pain and numbness in the arms and legs.  Tingling.  Loss of bladder control or bowel control.  Weakness and wasting of thigh muscles.  Difficulty getting up from a seated position.  Abdominal swelling.  Unexplained weight loss. How is this diagnosed? Diagnosis usually involves reviewing your medical history and any symptoms you have. Diagnosis varies depending on the type of neuropathy your health care provider suspects. Peripheral neuropathy Your health care provider will check areas that are affected by your nervous system (neurologic exam), such as your reflexes, how you move, and what you can feel. You may have other tests, such as:  Blood tests.  Removal and examination of fluid that surrounds the spinal cord (lumbar puncture).  CT scan.  MRI.  A test to check the nerves that control muscles (electromyogram, EMG).  Tests of how quickly messages pass through your nerves (nerve conduction velocity tests).  Removal of a small piece  of nerve to be examined under a microscope (biopsy). Autonomic neuropathy You may have tests, such as:  Tests to measure your blood pressure and heart rate. This may include monitoring you while you are safely secured to an exam table that moves you from a lying position to an upright position (table tilt test).  Breathing tests to check your lungs.  Tests to check how food moves through the digestive system (gastric emptying tests).  Blood, sweat, or urine tests.  Ultrasound of your bladder.  Spinal fluid tests. Focal neuropathy This condition may be diagnosed with:  A neurologic exam.  CT scan.  MRI.  EMG.  Nerve conduction velocity tests. Proximal neuropathy There is no test to diagnose this type of neuropathy. You may have tests to rule out other possible causes of this type of neuropathy. Tests may include:  X-rays of your spine and lumbar region.  Lumbar puncture.  MRI. How is this treated? The goal of treatment is to keep nerve damage from getting worse. The most important part of treatment is keeping your blood glucose level and your A1C level within your target range by following your diabetes management plan. Over time, maintaining lower blood glucose levels helps lessen symptoms. In some cases, you may need prescription pain medicine. Follow these instructions at home:  Lifestyle   Do not use any products that contain nicotine or tobacco, such as cigarettes and e-cigarettes. If you need help quitting, ask your health care provider.  Be physically active every day. Include strength training and balance exercises.  Follow a healthy meal plan.  Work with your health care provider to manage your blood pressure. General instructions  Follow your diabetes management plan as directed. ? Check your blood glucose levels as directed by your health care provider. ? Keep your blood glucose in your target range as directed by your health care provider. ? Have your  A1C level checked at least two times a year, or as often as told by your health care provider.  Take over the counter and prescription medicines only as told by your health care provider. This includes insulin and diabetes medicine.  Do not drive or use heavy machinery while taking prescription pain medicines.  Check your skin and feet every day for cuts, bruises, redness, blisters, or sores.  Keep all follow up visits as told by your health care provider. This is important. Contact a health care provider if:  You have burning, stabbing, or aching pain in your legs or feet.  You are unable to feel pressure or pain in your feet.  You develop problems with digestion, such as: ? Nausea. ? Vomiting. ? Bloating. ? Constipation. ? Diarrhea. ? Abdominal pain.  You have difficulty with urination, such as inability: ? To  control when you urinate (incontinence). ? To completely empty the bladder (retention).  You have palpitations.  You feel dizzy, weak, or faint when you stand up. Get help right away if:  You cannot urinate.  You have sudden weakness or loss of coordination.  You have trouble speaking.  You have pain or pressure in your chest.  You have an irregular heart beat.  You have sudden inability to move a part of your body. Summary  Diabetic neuropathy refers to nerve damage that is caused by diabetes. It can affect nerves throughout the entire body, causing numbness and pain in the arms, legs, digestive tract, heart, and other body systems.  Keep your blood glucose level and your blood pressure in your target range, as directed by your health care provider. This can help prevent neuropathy from getting worse.  Check your skin and feet every day for cuts, bruises, redness, blisters, or sores.  Do not use any products that contain nicotine or tobacco, such as cigarettes and e-cigarettes. If you need help quitting, ask your health care provider. This information is  not intended to replace advice given to you by your health care provider. Make sure you discuss any questions you have with your health care provider. Document Revised: 09/09/2017 Document Reviewed: 09/01/2016 Elsevier Patient Education  2020 [employer].

## 2020-03-27 ENCOUNTER — Other Ambulatory Visit: Payer: Self-pay

## 2020-03-27 ENCOUNTER — Encounter: Payer: Self-pay | Admitting: Family Medicine

## 2020-03-27 DIAGNOSIS — Z20822 Contact with and (suspected) exposure to covid-19: Secondary | ICD-10-CM

## 2020-03-28 LAB — NOVEL CORONAVIRUS, NAA: SARS-CoV-2, NAA: NOT DETECTED

## 2020-03-28 LAB — SARS-COV-2, NAA 2 DAY TAT

## 2020-04-17 MED FILL — ?BASAGLAR 100 UNITS/ML KWPE: 100 | 25 days supply | Qty: 9 | Fill #2

## 2020-04-23 ENCOUNTER — Ambulatory Visit: Payer: Self-pay

## 2020-04-25 ENCOUNTER — Ambulatory Visit: Payer: Self-pay

## 2020-05-08 ENCOUNTER — Other Ambulatory Visit: Payer: Self-pay

## 2020-05-08 DIAGNOSIS — Z20822 Contact with and (suspected) exposure to covid-19: Secondary | ICD-10-CM

## 2020-05-10 LAB — SARS-COV-2, NAA 2 DAY TAT

## 2020-05-10 LAB — NOVEL CORONAVIRUS, NAA: SARS-CoV-2, NAA: NOT DETECTED

## 2020-05-30 ENCOUNTER — Ambulatory Visit: Payer: Self-pay | Admitting: Family Medicine

## 2020-06-20 ENCOUNTER — Other Ambulatory Visit: Payer: Self-pay | Admitting: Family Medicine

## 2020-06-20 DIAGNOSIS — I10 Essential (primary) hypertension: Secondary | ICD-10-CM

## 2020-06-26 ENCOUNTER — Ambulatory Visit: Payer: Self-pay | Admitting: Family Medicine

## 2020-06-29 MED FILL — $LANTUS SOLOSTAR 100 UNITS/: 100 | 85 days supply | Qty: 30 | Fill #3

## 2020-08-20 ENCOUNTER — Other Ambulatory Visit: Payer: Self-pay

## 2020-08-20 DIAGNOSIS — Z20822 Contact with and (suspected) exposure to covid-19: Secondary | ICD-10-CM

## 2020-08-21 ENCOUNTER — Other Ambulatory Visit: Payer: Self-pay | Admitting: Family Medicine

## 2020-08-21 ENCOUNTER — Telehealth (INDEPENDENT_AMBULATORY_CARE_PROVIDER_SITE_OTHER): Payer: Self-pay | Admitting: Family Medicine

## 2020-08-21 ENCOUNTER — Encounter: Payer: Self-pay | Admitting: Family Medicine

## 2020-08-21 DIAGNOSIS — E119 Type 2 diabetes mellitus without complications: Secondary | ICD-10-CM

## 2020-08-21 DIAGNOSIS — E66813 Obesity, class 3: Secondary | ICD-10-CM

## 2020-08-21 DIAGNOSIS — Z09 Encounter for follow-up examination after completed treatment for conditions other than malignant neoplasm: Secondary | ICD-10-CM

## 2020-08-21 DIAGNOSIS — E1169 Type 2 diabetes mellitus with other specified complication: Secondary | ICD-10-CM

## 2020-08-21 DIAGNOSIS — Z6841 Body Mass Index (BMI) 40.0 and over, adult: Secondary | ICD-10-CM

## 2020-08-21 DIAGNOSIS — R739 Hyperglycemia, unspecified: Secondary | ICD-10-CM

## 2020-08-21 DIAGNOSIS — E1140 Type 2 diabetes mellitus with diabetic neuropathy, unspecified: Secondary | ICD-10-CM

## 2020-08-21 DIAGNOSIS — R7303 Prediabetes: Secondary | ICD-10-CM

## 2020-08-21 MED ORDER — INSULIN PEN NEEDLE 29G X 5MM MISC: 3 refills | Status: DC

## 2020-08-21 MED ORDER — GABAPENTIN 100 MG PO CAPS: 100.0000 mg | ORAL_CAPSULE | Freq: Three times a day (TID) | ORAL | 3 refills | Status: DC

## 2020-08-21 MED FILL — GABAPENTIN 100 MG CAPSULE: 100 | 30 days supply | Qty: 90 | Fill #0

## 2020-08-21 NOTE — Progress Notes (Signed)
Virtual Visit via Telephone Note  I connected with Tiffany Le on 08/21/20 at  8:40 AM EST by telephone and verified that I am speaking with the correct person using two identifiers.  Location: Patient: Home Provider: Office   I discussed the limitations, risks, security and privacy concerns of performing an evaluation and management service by telephone and the availability of in person appointments. I also discussed with the patient that there may be a patient responsible charge related to this service. The patient expressed understanding and agreed to proceed.   History of Present Illness:  Patient Active Problem List   Diagnosis Date Noted  . Shortness of breath 12/25/2019  . Hemoglobin A1C greater than 9%, indicating poor diabetic control 12/25/2019  . Sepsis due to urinary tract infection ([place]) 12/03/2019  . Sepsis secondary to UTI ([place]) 12/02/2019  . Class 3 severe obesity due to excess calories with serious comorbidity and body mass index (BMI) of 40.0 to 44.9 in adult ([place]) 10/24/2018  . Back pain 10/24/2018  . Elevated LFTs 01/13/2014  . Unspecified vitamin D deficiency 01/13/2014  . Uncontrolled type 2 diabetes mellitus with hyperglycemia, without long-term current use of insulin ([place]) 11/03/2013  . Anemia, iron deficiency 11/17/2012  . Essential hypertension, benign 11/17/2012  . Pure hypercholesterolemia 11/17/2012    Current Status: Since her last office visit, she is doing well with no complaints. Her most recent normal range of preprandial blood glucose levels have been between 130-160. She has seen low range of 90 and high of 230 since his last office visit. She has had frequent blurry vision, last eye exam was over a year ago. She denies fatigue, frequent urination, excessive hunger, excessive thirst, weight gain, weight loss, and poor wound healing. She continues to check her feet regularly. She denies visual changes, chest pain, cough, shortness of breath, heart  palpitations, and falls. she has occasional headaches and dizziness with position changes. Denies severe headaches, confusion, seizures, double vision, and blurred vision, nausea and vomiting. She denies fevers, chills, fatigue, recent infections, weight loss, and night sweats.  Denies GI problems such as nausea, vomiting, diarrhea, and constipation. She has no reports of blood in stools, dysuria and hematuria. No depression or anxiety, and denies suicidal ideations, homicidal ideations, or auditory hallucinations. She is taking all medications as prescribed. She denies pain today.    Observations/Objective:  Telephone Virtual Visit  Assessment and Plan:  1. Type 2 diabetes mellitus with other specified complication, without long-term current use of insulin (HCC) We will continue medication as prescribed, to decrease foods/beverages high in sugars and carbs and follow Heart Healthy or DASH diet. Increase physical activity to at least 30 minutes cardio exercise daily.  - Insulin Pen Needle 29G X 5MM MISC; Use with insulin  Dispense: 300 each; Refill: 3  2. Hemoglobin A1C between 7% and 9%, indicating borderline diabetic control Most recent Hgb A1c at 8.3 on 02/28/2020. Monitor.  - Insulin Pen Needle 29G X 5MM MISC; Use with insulin  Dispense: 300 each; Refill: 3  3. Hyperglycemi - Insaulin Pen Needle 29G X 5MM MISC; Use with insulin  Dispense: 300 each; Refill: 3  4. Diabetic neuropathy, painful (HCC) - gabapentin (NEURONTIN) 100 MG capsule; Take 1 capsule (100 mg total) by mouth 3 (three) times daily.  Dispense: 270 capsule; Refill: 3  5. Class 3 severe obesity due to excess calories with serious comorbidity and body mass index (BMI) of 40.0 to 44.9 in adult (HCC) Goal BMI  is <30.  Encouraged efforts to reduce weight include engaging in physical activity as tolerated with goal of 150 minutes per week. Improve dietary choices and eat a meal regimen consistent with a Mediterranean or DASH diet.  Reduce simple carbohydrates. Do not skip meals and eat healthy snacks throughout the day to avoid over-eating at dinner. Set a goal weight loss that is achievable for you.  6. Follow up She will follow up 11/2020 for Annual Physical and Lab work.   Meds ordered this encounter  Medications  . Insulin Pen Needle 29G X 5MM MISC    Sig: Use with insulin    Dispense:  300 each    Refill:  3  . gabapentin (NEURONTIN) 100 MG capsule    Sig: Take 1 capsule (100 mg total) by mouth 3 (three) times daily.    Dispense:  270 capsule    Refill:  3    No orders of the defined types were placed in this encounter.   Referral Orders  No referral(s) requested today    Kathe Becton, MSN, ANE, FNP-BC [facility] [place] [address] [place],  [address] [phone]- fax     I discussed the assessment and treatment plan with the patient. The patient was provided an opportunity to ask questions and all were answered. The patient agreed with the plan and demonstrated an understanding of the instructions.   The patient was advised to call back or seek an in-person evaluation if the symptoms worsen or if the condition fails to improve as anticipated.  I provided 20 minutes of non-face-to-face time during this encounter.   Azzie Glatter, FNP

## 2020-08-24 LAB — NOVEL CORONAVIRUS, NAA: SARS-CoV-2, NAA: NOT DETECTED

## 2020-09-11 DIAGNOSIS — I2699 Other pulmonary embolism without acute cor pulmonale: Secondary | ICD-10-CM

## 2020-09-11 DIAGNOSIS — Z951 Presence of aortocoronary bypass graft: Secondary | ICD-10-CM

## 2020-09-11 DIAGNOSIS — I214 Non-ST elevation (NSTEMI) myocardial infarction: Secondary | ICD-10-CM

## 2020-09-11 DIAGNOSIS — I639 Cerebral infarction, unspecified: Secondary | ICD-10-CM

## 2020-09-11 HISTORY — DX: Cerebral infarction, unspecified: I63.9

## 2020-09-11 HISTORY — DX: Non-ST elevation (NSTEMI) myocardial infarction: I21.4

## 2020-09-11 HISTORY — DX: Other pulmonary embolism without acute cor pulmonale: I26.99

## 2020-09-11 HISTORY — DX: Presence of aortocoronary bypass graft: Z95.1

## 2020-09-12 ENCOUNTER — Other Ambulatory Visit: Payer: Self-pay | Admitting: Family Medicine

## 2020-09-12 ENCOUNTER — Telehealth: Payer: Self-pay | Admitting: Family Medicine

## 2020-09-12 DIAGNOSIS — R0602 Shortness of breath: Secondary | ICD-10-CM

## 2020-09-12 MED ORDER — ALBUTEROL SULFATE HFA 108 (90 BASE) MCG/ACT IN AERS: 2.0000 | INHALATION_SPRAY | Freq: Four times a day (QID) | RESPIRATORY_TRACT | 12 refills | Status: DC | PRN

## 2020-09-12 MED FILL — ALBUTEROL SULFATE HFA 108 (: 108 (90 BAS | 25 days supply | Qty: 18 | Fill #0

## 2020-09-12 NOTE — Telephone Encounter (Signed)
Done

## 2020-09-19 ENCOUNTER — Ambulatory Visit: Payer: Self-pay

## 2020-09-24 ENCOUNTER — Emergency Department (HOSPITAL_COMMUNITY): Payer: Medicaid Other

## 2020-09-24 ENCOUNTER — Inpatient Hospital Stay (HOSPITAL_COMMUNITY)
Admission: EM | Admit: 2020-09-24 | Discharge: 2020-09-27 | DRG: 280 | Disposition: A | Discharge status: Home / Self Care | Payer: Medicaid Other | Attending: Cardiovascular Disease | Admitting: Cardiovascular Disease

## 2020-09-24 ENCOUNTER — Other Ambulatory Visit: Payer: Self-pay

## 2020-09-24 ENCOUNTER — Encounter (HOSPITAL_COMMUNITY): Payer: Self-pay | Admitting: Emergency Medicine

## 2020-09-24 DIAGNOSIS — Z7984 Long term (current) use of oral hypoglycemic drugs: Secondary | ICD-10-CM

## 2020-09-24 DIAGNOSIS — Z880 Allergy status to penicillin: Secondary | ICD-10-CM

## 2020-09-24 DIAGNOSIS — R590 Localized enlarged lymph nodes: Secondary | ICD-10-CM | POA: Diagnosis present

## 2020-09-24 DIAGNOSIS — I214 Non-ST elevation (NSTEMI) myocardial infarction: Principal | ICD-10-CM | POA: Diagnosis present

## 2020-09-24 DIAGNOSIS — Z20822 Contact with and (suspected) exposure to covid-19: Secondary | ICD-10-CM | POA: Diagnosis present

## 2020-09-24 DIAGNOSIS — Z79899 Other long term (current) drug therapy: Secondary | ICD-10-CM

## 2020-09-24 DIAGNOSIS — Z7982 Long term (current) use of aspirin: Secondary | ICD-10-CM

## 2020-09-24 DIAGNOSIS — Z8249 Family history of ischemic heart disease and other diseases of the circulatory system: Secondary | ICD-10-CM

## 2020-09-24 DIAGNOSIS — M7989 Other specified soft tissue disorders: Secondary | ICD-10-CM | POA: Diagnosis present

## 2020-09-24 DIAGNOSIS — E1165 Type 2 diabetes mellitus with hyperglycemia: Secondary | ICD-10-CM | POA: Diagnosis present

## 2020-09-24 DIAGNOSIS — I110 Hypertensive heart disease with heart failure: Secondary | ICD-10-CM | POA: Diagnosis present

## 2020-09-24 DIAGNOSIS — I10 Essential (primary) hypertension: Secondary | ICD-10-CM | POA: Diagnosis present

## 2020-09-24 DIAGNOSIS — IMO0002 Reserved for concepts with insufficient information to code with codable children: Secondary | ICD-10-CM

## 2020-09-24 DIAGNOSIS — E785 Hyperlipidemia, unspecified: Secondary | ICD-10-CM | POA: Diagnosis present

## 2020-09-24 DIAGNOSIS — I5041 Acute combined systolic (congestive) and diastolic (congestive) heart failure: Secondary | ICD-10-CM | POA: Diagnosis present

## 2020-09-24 DIAGNOSIS — Z794 Long term (current) use of insulin: Secondary | ICD-10-CM

## 2020-09-24 DIAGNOSIS — E1140 Type 2 diabetes mellitus with diabetic neuropathy, unspecified: Secondary | ICD-10-CM

## 2020-09-24 DIAGNOSIS — Z833 Family history of diabetes mellitus: Secondary | ICD-10-CM

## 2020-09-24 DIAGNOSIS — I25110 Atherosclerotic heart disease of native coronary artery with unstable angina pectoris: Secondary | ICD-10-CM | POA: Diagnosis present

## 2020-09-24 HISTORY — DX: Atherosclerotic heart disease of native coronary artery without angina pectoris: I25.10

## 2020-09-24 LAB — BASIC METABOLIC PANEL
Anion gap: 10 (ref 5–15)
BUN: 14 mg/dL (ref 6–20)
CO2: 27 mmol/L (ref 22–32)
Calcium: 9.2 mg/dL (ref 8.9–10.3)
Chloride: 101 mmol/L (ref 98–111)
Creatinine, Ser: 0.62 mg/dL (ref 0.44–1.00)
GFR, Estimated: >60 mL/min (ref >60)
Glucose, Bld: 89 mg/dL (ref 70–99)
Potassium: 3.6 mmol/L (ref 3.5–5.1)
Sodium: 138 mmol/L (ref 135–145)

## 2020-09-24 LAB — CBC
HCT: 35.4 % — ABNORMAL LOW (ref 36.0–46.0)
Hemoglobin: 11.5 g/dL — ABNORMAL LOW (ref 12.0–15.0)
MCH: 27.8 pg (ref 26.0–34.0)
MCHC: 32.5 g/dL (ref 30.0–36.0)
MCV: 85.5 fL (ref 80.0–100.0)
Platelets: 307 10*3/uL (ref 150–400)
RBC: 4.14 MIL/uL (ref 3.87–5.11)
RDW: 13.4 % (ref 11.5–15.5)
WBC: 7.1 10*3/uL (ref 4.0–10.5)
nRBC: 0 % (ref 0.0–0.2)

## 2020-09-24 NOTE — ED Triage Notes (Signed)
Patient from home. Complaint of shortness of breath x2 weeks. Patient states it has been getting progressively worse. Patient states was tested for covid and negative.

## 2020-09-25 ENCOUNTER — Other Ambulatory Visit (HOSPITAL_COMMUNITY): Payer: Self-pay

## 2020-09-25 ENCOUNTER — Emergency Department (HOSPITAL_COMMUNITY): Payer: Medicaid Other

## 2020-09-25 ENCOUNTER — Encounter (HOSPITAL_COMMUNITY): Payer: Self-pay | Admitting: Emergency Medicine

## 2020-09-25 ENCOUNTER — Encounter (HOSPITAL_COMMUNITY)
Admission: EM | Disposition: A | Discharge status: Home / Self Care | Payer: Self-pay | Source: Home / Self Care | Attending: Internal Medicine

## 2020-09-25 DIAGNOSIS — I214 Non-ST elevation (NSTEMI) myocardial infarction: Principal | ICD-10-CM

## 2020-09-25 DIAGNOSIS — M7989 Other specified soft tissue disorders: Secondary | ICD-10-CM | POA: Diagnosis present

## 2020-09-25 DIAGNOSIS — Z7982 Long term (current) use of aspirin: Secondary | ICD-10-CM | POA: Diagnosis not present

## 2020-09-25 DIAGNOSIS — Z20822 Contact with and (suspected) exposure to covid-19: Secondary | ICD-10-CM | POA: Diagnosis present

## 2020-09-25 DIAGNOSIS — Z794 Long term (current) use of insulin: Secondary | ICD-10-CM

## 2020-09-25 DIAGNOSIS — I5041 Acute combined systolic (congestive) and diastolic (congestive) heart failure: Secondary | ICD-10-CM | POA: Diagnosis present

## 2020-09-25 DIAGNOSIS — R0602 Shortness of breath: Secondary | ICD-10-CM | POA: Diagnosis present

## 2020-09-25 DIAGNOSIS — E1165 Type 2 diabetes mellitus with hyperglycemia: Secondary | ICD-10-CM

## 2020-09-25 DIAGNOSIS — E1140 Type 2 diabetes mellitus with diabetic neuropathy, unspecified: Secondary | ICD-10-CM | POA: Diagnosis present

## 2020-09-25 DIAGNOSIS — I10 Essential (primary) hypertension: Secondary | ICD-10-CM

## 2020-09-25 DIAGNOSIS — I509 Heart failure, unspecified: Secondary | ICD-10-CM | POA: Diagnosis not present

## 2020-09-25 DIAGNOSIS — I2510 Atherosclerotic heart disease of native coronary artery without angina pectoris: Secondary | ICD-10-CM

## 2020-09-25 DIAGNOSIS — IMO0002 Reserved for concepts with insufficient information to code with codable children: Secondary | ICD-10-CM

## 2020-09-25 DIAGNOSIS — Z7984 Long term (current) use of oral hypoglycemic drugs: Secondary | ICD-10-CM | POA: Diagnosis not present

## 2020-09-25 DIAGNOSIS — Z79899 Other long term (current) drug therapy: Secondary | ICD-10-CM | POA: Diagnosis not present

## 2020-09-25 DIAGNOSIS — R590 Localized enlarged lymph nodes: Secondary | ICD-10-CM | POA: Diagnosis present

## 2020-09-25 DIAGNOSIS — E119 Type 2 diabetes mellitus without complications: Secondary | ICD-10-CM

## 2020-09-25 DIAGNOSIS — I25110 Atherosclerotic heart disease of native coronary artery with unstable angina pectoris: Secondary | ICD-10-CM | POA: Diagnosis present

## 2020-09-25 DIAGNOSIS — I110 Hypertensive heart disease with heart failure: Secondary | ICD-10-CM | POA: Diagnosis present

## 2020-09-25 DIAGNOSIS — Z8249 Family history of ischemic heart disease and other diseases of the circulatory system: Secondary | ICD-10-CM | POA: Diagnosis not present

## 2020-09-25 DIAGNOSIS — E785 Hyperlipidemia, unspecified: Secondary | ICD-10-CM | POA: Diagnosis present

## 2020-09-25 DIAGNOSIS — Z833 Family history of diabetes mellitus: Secondary | ICD-10-CM | POA: Diagnosis not present

## 2020-09-25 DIAGNOSIS — I255 Ischemic cardiomyopathy: Secondary | ICD-10-CM | POA: Diagnosis not present

## 2020-09-25 DIAGNOSIS — Z880 Allergy status to penicillin: Secondary | ICD-10-CM | POA: Diagnosis not present

## 2020-09-25 HISTORY — PX: CARDIAC CATHETERIZATION: SHX172

## 2020-09-25 HISTORY — PX: LEFT HEART CATH AND CORONARY ANGIOGRAPHY: CATH118249

## 2020-09-25 LAB — TROPONIN I (HIGH SENSITIVITY)
Troponin I (High Sensitivity): 1390 ng/L (ref <18)
Troponin I (High Sensitivity): 926 ng/L (ref <18)

## 2020-09-25 LAB — LIPID PANEL
Cholesterol: 131 mg/dL (ref 0–200)
HDL: 30 mg/dL — ABNORMAL LOW (ref >40)
LDL Cholesterol: 91 mg/dL (ref 0–99)
Total CHOL/HDL Ratio: 4.4 RATIO
Triglycerides: 49 mg/dL (ref <150)
VLDL: 10 mg/dL (ref 0–40)

## 2020-09-25 LAB — RESP PANEL BY RT-PCR (FLU A&B, COVID) ARPGX2
Influenza A by PCR: NEGATIVE
Influenza B by PCR: NEGATIVE
SARS Coronavirus 2 by RT PCR: NEGATIVE

## 2020-09-25 LAB — I-STAT BETA HCG BLOOD, ED (MC, WL, AP ONLY): I-stat hCG, quantitative: <5 m[IU]/mL (ref <5)

## 2020-09-25 LAB — GLUCOSE, CAPILLARY
Glucose-Capillary: 98 mg/dL (ref 70–99)
Glucose-Capillary: 173 mg/dL — ABNORMAL HIGH (ref 70–99)

## 2020-09-25 LAB — HEPARIN LEVEL (UNFRACTIONATED): Heparin Unfractionated: 0.4 IU/mL (ref 0.30–0.70)

## 2020-09-25 SURGERY — LEFT HEART CATH AND CORONARY ANGIOGRAPHY
Anesthesia: LOCAL

## 2020-09-25 MED ORDER — ASPIRIN 81 MG PO CHEW
324.0000 mg | CHEWABLE_TABLET | Freq: Once | ORAL | Status: AC
  Administered 2020-09-25: 324 mg via ORAL
  Filled 2020-09-25: qty 4

## 2020-09-25 MED ORDER — VERAPAMIL HCL 2.5 MG/ML IV SOLN
INTRAVENOUS | Status: AC
  Filled 2020-09-25: qty 2

## 2020-09-25 MED ORDER — HEPARIN SODIUM (PORCINE) 5000 UNIT/ML IJ SOLN: 60.0000 [IU]/kg | Freq: Once | INTRAMUSCULAR | Status: DC

## 2020-09-25 MED ORDER — LABETALOL HCL 5 MG/ML IV SOLN: 10.0000 mg | INTRAVENOUS | Status: AC | PRN

## 2020-09-25 MED ORDER — FENTANYL CITRATE (PF) 100 MCG/2ML IJ SOLN
INTRAMUSCULAR | Status: AC
  Filled 2020-09-25: qty 2

## 2020-09-25 MED ORDER — ONDANSETRON HCL 4 MG/2ML IJ SOLN: 4.0000 mg | Freq: Four times a day (QID) | INTRAMUSCULAR | Status: DC | PRN

## 2020-09-25 MED ORDER — LIDOCAINE HCL (PF) 1 % IJ SOLN
INTRAMUSCULAR | Status: DC | PRN
  Administered 2020-09-25: 5 mL

## 2020-09-25 MED ORDER — MIDAZOLAM HCL 2 MG/2ML IJ SOLN
INTRAMUSCULAR | Status: AC
  Filled 2020-09-25: qty 2

## 2020-09-25 MED ORDER — SODIUM CHLORIDE 0.9 % WEIGHT BASED INFUSION
1.0000 mL/kg/h | INTRAVENOUS | Status: DC
  Administered 2020-09-25: 1 mL/kg/h via INTRAVENOUS

## 2020-09-25 MED ORDER — GABAPENTIN 100 MG PO CAPS
100.0000 mg | ORAL_CAPSULE | Freq: Three times a day (TID) | ORAL | Status: DC
  Administered 2020-09-25 – 2020-09-26 (×2): 100 mg via ORAL
  Filled 2020-09-25 (×6): qty 1

## 2020-09-25 MED ORDER — METOPROLOL TARTRATE 12.5 MG HALF TABLET: 12.5000 mg | ORAL_TABLET | Freq: Two times a day (BID) | ORAL | Status: DC

## 2020-09-25 MED ORDER — SODIUM CHLORIDE 0.9 % WEIGHT BASED INFUSION: 3.0000 mL/kg/h | INTRAVENOUS | Status: DC

## 2020-09-25 MED ORDER — ASPIRIN EC 81 MG PO TBEC
81.0000 mg | DELAYED_RELEASE_TABLET | Freq: Every day | ORAL | Status: DC
Start: 2020-09-25 — End: 2020-09-27
  Administered 2020-09-26 – 2020-09-27 (×2): 81 mg via ORAL
  Filled 2020-09-25 (×2): qty 1

## 2020-09-25 MED ORDER — FUROSEMIDE 10 MG/ML IJ SOLN
40.0000 mg | Freq: Once | INTRAMUSCULAR | Status: AC
  Administered 2020-09-25: 40 mg via INTRAVENOUS
  Filled 2020-09-25: qty 4

## 2020-09-25 MED ORDER — SODIUM CHLORIDE 0.9% FLUSH
3.0000 mL | Freq: Two times a day (BID) | INTRAVENOUS | Status: DC
  Administered 2020-09-26 (×2): 3 mL via INTRAVENOUS

## 2020-09-25 MED ORDER — IOHEXOL 350 MG/ML SOLN
INTRAVENOUS | Status: DC | PRN
  Administered 2020-09-25: 70 mL

## 2020-09-25 MED ORDER — HYDRALAZINE HCL 20 MG/ML IJ SOLN: 10.0000 mg | INTRAMUSCULAR | Status: AC | PRN

## 2020-09-25 MED ORDER — INSULIN GLARGINE 100 UNIT/ML ~~LOC~~ SOLN
35.0000 [IU] | Freq: Every day | SUBCUTANEOUS | Status: DC
  Administered 2020-09-26 – 2020-09-27 (×2): 35 [IU] via SUBCUTANEOUS
  Filled 2020-09-25 (×3): qty 0.35

## 2020-09-25 MED ORDER — INSULIN GLARGINE 100 UNIT/ML SOLOSTAR PEN: 35.0000 [IU] | PEN_INJECTOR | Freq: Every day | SUBCUTANEOUS | Status: DC

## 2020-09-25 MED ORDER — FENTANYL CITRATE (PF) 100 MCG/2ML IJ SOLN
INTRAMUSCULAR | Status: DC | PRN
  Administered 2020-09-25: 25 ug via INTRAVENOUS

## 2020-09-25 MED ORDER — ACETAMINOPHEN 325 MG PO TABS: 650.0000 mg | ORAL_TABLET | ORAL | Status: DC | PRN

## 2020-09-25 MED ORDER — SODIUM CHLORIDE 0.9 % IV SOLN: INTRAVENOUS | Status: AC

## 2020-09-25 MED ORDER — VERAPAMIL HCL 2.5 MG/ML IV SOLN
INTRAVENOUS | Status: DC | PRN
  Administered 2020-09-25: 10 mL via INTRA_ARTERIAL

## 2020-09-25 MED ORDER — ASPIRIN 81 MG PO CHEW
81.0000 mg | CHEWABLE_TABLET | ORAL | Status: AC
  Administered 2020-09-25: 81 mg via ORAL
  Filled 2020-09-25: qty 1

## 2020-09-25 MED ORDER — LIDOCAINE HCL (PF) 1 % IJ SOLN
INTRAMUSCULAR | Status: AC
  Filled 2020-09-25: qty 30

## 2020-09-25 MED ORDER — NITROGLYCERIN 0.4 MG SL SUBL: 0.4000 mg | SUBLINGUAL_TABLET | SUBLINGUAL | Status: DC | PRN

## 2020-09-25 MED ORDER — SODIUM CHLORIDE 0.9% FLUSH: 3.0000 mL | INTRAVENOUS | Status: DC | PRN

## 2020-09-25 MED ORDER — ATORVASTATIN CALCIUM 80 MG PO TABS: 80.0000 mg | ORAL_TABLET | Freq: Every day | ORAL | Status: DC

## 2020-09-25 MED ORDER — LISINOPRIL 10 MG PO TABS
10.0000 mg | ORAL_TABLET | Freq: Every day | ORAL | Status: DC
  Administered 2020-09-25 – 2020-09-27 (×3): 10 mg via ORAL
  Filled 2020-09-25 (×3): qty 1

## 2020-09-25 MED ORDER — ATORVASTATIN CALCIUM 80 MG PO TABS
80.0000 mg | ORAL_TABLET | Freq: Every day | ORAL | Status: DC
  Administered 2020-09-25 – 2020-09-26 (×2): 80 mg via ORAL
  Filled 2020-09-25 (×2): qty 1

## 2020-09-25 MED ORDER — METOPROLOL TARTRATE 12.5 MG HALF TABLET
12.5000 mg | ORAL_TABLET | Freq: Two times a day (BID) | ORAL | Status: DC
  Administered 2020-09-25 – 2020-09-26 (×2): 12.5 mg via ORAL
  Filled 2020-09-25 (×2): qty 1

## 2020-09-25 MED ORDER — HEPARIN (PORCINE) 25000 UT/250ML-% IV SOLN
1300.0000 [IU]/h | INTRAVENOUS | Status: DC
  Administered 2020-09-25: 1300 [IU]/h via INTRAVENOUS
  Filled 2020-09-25: qty 250

## 2020-09-25 MED ORDER — HEPARIN SODIUM (PORCINE) 1000 UNIT/ML IJ SOLN
INTRAMUSCULAR | Status: AC
  Filled 2020-09-25: qty 1

## 2020-09-25 MED ORDER — HEPARIN (PORCINE) 25000 UT/250ML-% IV SOLN
1400.0000 [IU]/h | INTRAVENOUS | Status: DC
  Administered 2020-09-25: 23:00:00 1300 [IU]/h via INTRAVENOUS
  Administered 2020-09-26 – 2020-09-27 (×2): 1400 [IU]/h via INTRAVENOUS
  Filled 2020-09-25 (×4): qty 250

## 2020-09-25 MED ORDER — HEPARIN BOLUS VIA INFUSION
4000.0000 [IU] | Freq: Once | INTRAVENOUS | Status: AC
  Administered 2020-09-25: 4000 [IU] via INTRAVENOUS
  Filled 2020-09-25: qty 4000

## 2020-09-25 MED ORDER — HEPARIN (PORCINE) IN NACL 1000-0.9 UT/500ML-% IV SOLN
INTRAVENOUS | Status: AC
  Filled 2020-09-25: qty 1000

## 2020-09-25 MED ORDER — HEPARIN (PORCINE) IN NACL 1000-0.9 UT/500ML-% IV SOLN
INTRAVENOUS | Status: DC | PRN
  Administered 2020-09-25 (×2): 500 mL

## 2020-09-25 MED ORDER — SODIUM CHLORIDE 0.9 % IV SOLN: 250.0000 mL | INTRAVENOUS | Status: DC | PRN

## 2020-09-25 MED ORDER — HEPARIN SODIUM (PORCINE) 1000 UNIT/ML IJ SOLN
INTRAMUSCULAR | Status: DC | PRN
  Administered 2020-09-25: 6000 [IU] via INTRAVENOUS

## 2020-09-25 MED ORDER — IOHEXOL 350 MG/ML SOLN
80.0000 mL | Freq: Once | INTRAVENOUS | Status: AC | PRN
  Administered 2020-09-25: 80 mL via INTRAVENOUS

## 2020-09-25 MED ORDER — MIDAZOLAM HCL 2 MG/2ML IJ SOLN
INTRAMUSCULAR | Status: DC | PRN
  Administered 2020-09-25: 2 mg via INTRAVENOUS

## 2020-09-25 SURGICAL SUPPLY — 11 items
CATH INFINITI 5FR ANG PIGTAIL (CATHETERS) ×1 IMPLANT
CATH OPTITORQUE TIG 4.0 5F (CATHETERS) ×1 IMPLANT
DEVICE RAD COMP TR BAND LRG (VASCULAR PRODUCTS) ×1 IMPLANT
GLIDESHEATH SLEND SS 6F .021 (SHEATH) ×1 IMPLANT
GUIDEWIRE INQWIRE 1.5J.035X260 (WIRE) IMPLANT
INQWIRE 1.5J .035X260CM (WIRE) ×4
KIT HEART LEFT (KITS) ×2 IMPLANT
PACK CARDIAC CATHETERIZATION (CUSTOM PROCEDURE TRAY) ×2 IMPLANT
SHEATH PROBE COVER 6X72 (BAG) ×1 IMPLANT
TRANSDUCER W/STOPCOCK (MISCELLANEOUS) ×2 IMPLANT
TUBING CIL FLEX 10 FLL-RA (TUBING) ×2 IMPLANT

## 2020-09-25 NOTE — Plan of Care (Signed)
  Problem: Education: Goal: Knowledge of General Education information will improve Description Including pain rating scale, medication(s)/side effects and non-pharmacologic comfort measures Outcome: Progressing   

## 2020-09-25 NOTE — Progress Notes (Signed)
Upon arrival to [facility] patient had a slight ooze to Rt Radial site. 2cc of air placed back in the TR band for a total of 9cc in the band at this time. Gina,RN at bedside and aware. Rt Radial site remains soft.

## 2020-09-25 NOTE — Progress Notes (Signed)
ANTICOAGULATION CONSULT NOTE - Initial Consult  Pharmacy Consult for Heparin Indication: chest pain/ACS  Allergies  Allergen Reactions  . Penicillins Other (See Comments)    Historical Has patient had a PCN reaction causing immediate rash, facial/tongue/throat swelling, SOB or lightheadedness with hypotension: No Has patient had a PCN reaction causing severe rash involving mucus membranes or skin necrosis: No Has patient had a PCN reaction that required hospitalization: No Has patient had a PCN reaction occurring within the last 10 years: No If all of the above answers are "NO", then may proceed with Cephalosporin use.    Patient Measurements:   Heparin Dosing Weight: 80 kg  Vital Signs: Temp: 98.1 F (36.7 C) (02/14 2302) Temp Source: Oral (02/14 2302) BP: 122/77 (02/15 0300) Pulse Rate: 100 (02/15 0145)  Labs: Recent Labs    09/24/20 1720 09/25/20 0126  HGB 11.5*  --   HCT 35.4*  --   PLT 307  --   CREATININE 0.62  --   TROPONINIHS  --  1,390*    CrCl cannot be calculated (Unknown ideal weight.).   Medical History: Past Medical History:  Diagnosis Date  . Diabetes mellitus without complication ([place])   . Diabetic neuropathy ([place]) 02/2020  . Hyperlipidemia age 87  . Hypertension age 31  . Hypocalcemia 11/2019  . Hypokalemia 11/2019  . Iron deficiency anemia   . Obesity   . Proteinuria 12/09/2019    Medications:  No current facility-administered medications on file prior to encounter.   Current Outpatient Medications on File Prior to Encounter  Medication Sig Dispense Refill  . albuterol (PROVENTIL HFA) 108 (90 Base) MCG/ACT inhaler Inhale 2 puffs into the lungs every 6 (six) hours as needed for wheezing. 18 g 12  . amLODipine (NORVASC) 10 MG tablet Take 1 tablet (10 mg total) by mouth daily. 30 tablet 11  . aspirin EC 81 MG tablet Take 81 mg by mouth daily.    Marland Kitchen atorvastatin (LIPITOR) 10 MG tablet Take 1 tablet (10 mg total) by mouth daily. 30 tablet 11   . cyclobenzaprine (FLEXERIL) 10 MG tablet Take 1 tablet (10 mg total) by mouth 2 (two) times daily as needed for muscle spasms. (Patient not taking: Reported on 08/21/2020) 30 tablet 6  . gabapentin (NEURONTIN) 100 MG capsule Take 1 capsule (100 mg total) by mouth 3 (three) times daily. 270 capsule 3  . glipiZIDE (GLUCOTROL) 10 MG tablet TAKE 1 TABLET BY MOUTH TWICE DAILY BEFORE A MEAL 60 tablet 11  . hydrochlorothiazide (HYDRODIURIL) 25 MG tablet Take 1 tablet (25 mg total) by mouth daily. 30 tablet 11  . insulin glargine (LANTUS SOLOSTAR) 100 UNIT/ML Solostar Pen Inject 35 Units into the skin daily. 5 pen PRN  . Insulin Pen Needle 29G X 5MM MISC Use with insulin 300 each 3  . lisinopril (ZESTRIL) 10 MG tablet Take 1 tablet (10 mg total) by mouth daily. 30 tablet 11  . Vitamin D, Ergocalciferol, (DRISDOL) 1.25 MG (50000 UNIT) CAPS capsule Take 1 capsule (50,000 Units total) by mouth every 7 (seven) days. (Patient not taking: No sig reported) 5 capsule 6     Assessment: 45 y.o. female with chest pain for heparin Goal of Therapy:  Heparin level 0.3-0.7 units/ml Monitor platelets by anticoagulation protocol: Yes   Plan:  Heparin 4000 units IV bolus, then start heparin 1300 units/hr Check heparin level in 6 hours.   Caryl Pina 09/25/2020,3:21 AM

## 2020-09-25 NOTE — Progress Notes (Signed)
Report and care called to Tiffany Le on 3E. Patient continues to rest comfortably in bed. TR band to Rt Radial with 7cc of air at this time. Rt Radial site remains soft with no active bleeding or hematoma noted. VSS. Patient states no pain. Transporting patient at this time.

## 2020-09-25 NOTE — H&P (View-Only) (Signed)
Progress Note  Patient Name: Tiffany Le Date of Encounter: 09/25/2020  [facility] Cardiologist: No primary care provider on file.   Subjective   No chest pain this morning, sitting comfortably in bed  Inpatient Medications    Scheduled Meds: . furosemide  40 mg Intravenous Once   Continuous Infusions: . heparin 1,300 Units/hr (09/25/20 0430)   PRN Meds:    Vital Signs    Vitals:   09/25/20 0354 09/25/20 0421 09/25/20 0445 09/25/20 0745  BP: 122/80  117/83 114/73  Pulse: (!) 116  (!) 41 97  Resp: 20  (!) 23 15  Temp:    98.3 F (36.8 C)  TempSrc:    Oral  SpO2: 100%  91% 95%  Weight:  122.5 kg    Height:  5\' 4"  (1.626 m)     No intake or output data in the 24 hours ending 09/25/20 0831 Last 3 Weights 09/25/2020 02/28/2020 12/23/2019  Weight (lbs) 270 lb 248 lb 12.8 oz 248 lb 3.2 oz  Weight (kg) 122.471 kg 112.855 kg 112.583 kg      Telemetry    Sinus rhythm - Personally Reviewed  ECG    Sinus tachycardia, 111 bpm, nonspecific ST changes in lateral leads- Personally Reviewed  Physical Exam   GEN: No acute distress.   Neck: No JVD Cardiac: RRR, no murmurs, rubs, or gallops.  Respiratory: Clear to auscultation bilaterally. GI: Soft, nontender, non-distended  MS: No edema; No deformity. Neuro:  Nonfocal  Psych: Normal affect   Labs    High Sensitivity Troponin:   Recent Labs  Lab 09/25/20 0126 09/25/20 0418  TROPONINIHS 1,390* 926*      Chemistry Recent Labs  Lab 09/24/20 1720  NA 138  K 3.6  CL 101  CO2 27  GLUCOSE 89  BUN 14  CREATININE 0.62  CALCIUM 9.2  GFRNONAA >60  ANIONGAP 10     Hematology Recent Labs  Lab 09/24/20 1720  WBC 7.1  RBC 4.14  HGB 11.5*  HCT 35.4*  MCV 85.5  MCH 27.8  MCHC 32.5  RDW 13.4  PLT 307    BNPNo results for input(s): BNP, PROBNP in the last 168 hours.   DDimer No results for input(s): DDIMER in the last 168 hours.   Radiology    DG Chest 2 View  Result Date:  09/24/2020 CLINICAL DATA:  Shortness of breath for 2 weeks, progressive worsening EXAM: CHEST - 2 VIEW COMPARISON:  12/02/2019 FINDINGS: Frontal and lateral views of the chest demonstrate an unremarkable cardiac silhouette. Blunting of the left lateral costophrenic angle may reflect pleural thickening or trace effusion. Patchy right basilar airspace disease. No pneumothorax. No acute bony abnormalities. IMPRESSION: 1. Trace left pleural effusion versus pleural thickening. 2. Patchy right basilar airspace disease. Electronically Signed   By: Randa Ngo M.D.   On: 09/24/2020 17:57   CT Angio Chest PE W and/or Wo Contrast  Result Date: 09/25/2020 CLINICAL DATA:  Chest pain, shortness of breath, pleurisy EXAM: CT ANGIOGRAPHY CHEST WITH CONTRAST TECHNIQUE: Multidetector CT imaging of the chest was performed using the standard protocol during bolus administration of intravenous contrast. Multiplanar CT image reconstructions and MIPs were obtained to evaluate the vascular anatomy. CONTRAST:  7mL OMNIPAQUE IOHEXOL 350 MG/ML SOLN COMPARISON:  Chest x-ray 12/01/2008, chest x-ray 09/24/2020 FINDINGS: Cardiovascular: Satisfactory opacification of the pulmonary arteries to the segmental level. No evidence of pulmonary embolism. Normal heart size. No significant pericardial effusion. The thoracic aorta is normal in caliber. No  atherosclerotic plaque of the thoracic aorta. No coronary artery calcifications. Mediastinum/Nodes: Bilateral hilar lymphadenopathy: 1.5 on the right (5:59) and 1.1 on the left (5: 52). Mediastinal lymphadenopathy: As an example a prevascular 1.1 cm lymph node (5:41) and a 1.1 cm precarinal lymph node (5:50). Axillary lymphadenopathy: A 1.4 cm right axillary lymph node (5:57) and a 1 cm left axial lymph node (5:44). No enlarged axillary lymph nodes. Thyroid gland, trachea, and esophagus demonstrate no significant findings. Lungs/Pleura: Patchy ground-glass and consolidative airspace opacities  within the lungs that are most prominent within the lower lobes. No pleural effusion. No pneumothorax. Upper Abdomen: No acute abnormality. Musculoskeletal: No chest wall abnormality. No acute or significant osseous findings. Review of the MIP images confirms the above findings. IMPRESSION: 1. Mediastinal, bilateral hilar, bilateral axillary lymphadenopathy. 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 2. Multifocal ground-glass airspace opacities that could represent a multifocal pneumonia (such as COVID 19 infection) versus inflammation. 3. No pulmonary embolus. Electronically Signed   By: Iven Finn M.D.   On: 09/25/2020 02:34    Cardiac Studies   Echo: pending  Patient Profile     45 y.o. female with HTN, HLD, DM who presents with shortness of breath found to have an NSTEMI  Assessment & Plan    1. NSTEMI: hsTn 1390, now down trending at 925. She remains pain free this morning. EKG with nonspecific changes.  -- remains on IV heparin -- plans for cardiac cath today The patient understands that risks included but are not limited to stroke (1 in 1000), death (1 in 1000), kidney failure [usually temporary] (1 in 500), bleeding (1 in 200), allergic reaction [possibly serious] (1 in 200).   2. HTN: blood pressures stable in the ED -- continue on metoprolol -- will hold lisinopril/HCTZ and amlodipine as blood pressures are soft to allow for metoprolol  3. DM: Hgb A1c 8.3 (7/21) -- holding home oral meds -- SSI while in patient  4. HLD: check lipids -- increase atorvastatin 80mg  daily  5. Lymphadenopathy: mediastinal, bilateral hilar, bilateral axillary lymphadenopathy. Concerning for lymphoproliferative disorder/  -- likely outpatient evaluation     For questions or updates, please contact [facility] Please consult [email or web address] for contact info under   Signed, Reino Bellis, NP  09/25/2020, 8:31 AM    Agree with note by Reino Bellis NP-C  Tiffany Le was  admitted with non-STEMI.  Her troponin is increased to about 1000.  Her EKG shows nonspecific changes.  She does have positive risk factors including family history, diabetes, hypertension hyperlipidemia.  She has had progressive dyspnea over the last several weeks with orthopnea that became worse yesterday.  She is currently asymptomatic.  She denies chest pain.  She is on IV heparin as well as aspirin, beta-blocker and a statin drug.  Her exam is benign.  She is scheduled for right left heart cath this morning.  I suspect she will have multivessel disease.  I have reviewed the risks, indications, and alternatives to cardiac catheterization, possible angioplasty, and stenting with the patient. Risks include but are not limited to bleeding, infection, vascular injury, stroke, myocardial infection, arrhythmia, kidney injury, radiation-related injury in the case of prolonged fluoroscopy use, emergency cardiac surgery, and death. The patient understands the risks of serious complication is 1-2 in 5009 with diagnostic cardiac cath and 1-2% or less with angioplasty/stenting.   Lorretta Harp, M.D., [place], [facility], [place] [address] [place], [place]  [address]  [phone] 09/25/2020  9:37 AM
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
## 2020-09-25 NOTE — ED Provider Notes (Signed)
[facility] EMERGENCY DEPARTMENT Provider Note   CSN: [id number] Arrival date & time: 09/24/20  1711     History Chief Complaint  Patient presents with  . Shortness of Breath    Tiffany Le is a 45 y.o. female.  The history is provided by the patient.  Shortness of Breath Severity:  Moderate Onset quality:  Gradual Duration:  2 weeks Timing:  Constant Progression:  Worsening Chronicity:  New Context: activity   Relieved by:  Nothing Worsened by:  Nothing Ineffective treatments:  None tried Associated symptoms: no abdominal pain, no diaphoresis, no fever, no hemoptysis, no neck pain, no rash and no syncope   Risk factors: no recent alcohol use   Patient with poorly controlled type 2 Diabetes presents with 2-3 weeks of shortness of breath.  No f/c/r.  Had a negative covid test at the onset of symptoms 3 weeks ago.  No f/c/r.  Some peripheral edema.  No n/v/d.       Past Medical History:  Diagnosis Date  . Diabetes mellitus without complication ([place])   . Diabetic neuropathy ([place]) 02/2020  . Hyperlipidemia age 52  . Hypertension age 26  . Hypocalcemia 11/2019  . Hypokalemia 11/2019  . Iron deficiency anemia   . Obesity   . Proteinuria 12/09/2019    Patient Active Problem List   Diagnosis Date Noted  . Shortness of breath 12/25/2019  . Hemoglobin A1C greater than 9%, indicating poor diabetic control 12/25/2019  . Sepsis due to urinary tract infection ([place]) 12/03/2019  . Sepsis secondary to UTI ([place]) 12/02/2019  . Class 3 severe obesity due to excess calories with serious comorbidity and body mass index (BMI) of 40.0 to 44.9 in adult ([place]) 10/24/2018  . Back pain 10/24/2018  . Elevated LFTs 01/13/2014  . Unspecified vitamin D deficiency 01/13/2014  . Uncontrolled type 2 diabetes mellitus with hyperglycemia, without long-term current use of insulin ([place]) 11/03/2013  . Anemia, iron deficiency 11/17/2012  . Essential hypertension, benign  11/17/2012  . Pure hypercholesterolemia 11/17/2012    Past Surgical History:  Procedure Laterality Date  . CESAREAN SECTION  3/96  . ORIF FEMUR FRACTURE       OB History    Gravida  1   Para  1   Term      Preterm      AB      Living  1     SAB      IAB      Ectopic      Multiple      Live Births              Family History  Problem Relation Age of Onset  . Heart disease Mother        CABG <50  . Hypertension Mother   . Diabetes Mother   . Hyperlipidemia Mother   . Heart disease Father   . Hyperlipidemia Father   . Hypertension Father   . Early death Father   . Healthy Daughter   . Diabetes Maternal Aunt   . Cancer Maternal Grandmother        stomach cancer  . Diabetes Maternal Aunt   . Cancer Cousin        colon cancer (dx'd 56)    Social History   Tobacco Use  . Smoking status: Never Smoker  . Smokeless tobacco: Never Used  Vaping Use  . Vaping Use: Never used  Substance Use Topics  . Alcohol use: No  .  Drug use: No    Home Medications Prior to Admission medications   Medication Sig Start Date End Date Taking? Authorizing Provider  albuterol (PROVENTIL HFA) 108 (90 Base) MCG/ACT inhaler Inhale 2 puffs into the lungs every 6 (six) hours as needed for wheezing. 09/12/20   Azzie Glatter, FNP  amLODipine (NORVASC) 10 MG tablet Take 1 tablet (10 mg total) by mouth daily. 12/25/19   Azzie Glatter, FNP  aspirin EC 81 MG tablet Take 81 mg by mouth daily.    [provider]  atorvastatin (LIPITOR) 10 MG tablet Take 1 tablet (10 mg total) by mouth daily. 12/25/19   Azzie Glatter, FNP  cyclobenzaprine (FLEXERIL) 10 MG tablet Take 1 tablet (10 mg total) by mouth 2 (two) times daily as needed for muscle spasms. Patient not taking: Reported on 08/21/2020 12/25/19   Azzie Glatter, FNP  gabapentin (NEURONTIN) 100 MG capsule Take 1 capsule (100 mg total) by mouth 3 (three) times daily. 08/21/20   Azzie Glatter, FNP  glipiZIDE  (GLUCOTROL) 10 MG tablet TAKE 1 TABLET BY MOUTH TWICE DAILY BEFORE A MEAL 12/25/19   Azzie Glatter, FNP  hydrochlorothiazide (HYDRODIURIL) 25 MG tablet Take 1 tablet (25 mg total) by mouth daily. 12/25/19   Azzie Glatter, FNP  insulin glargine (LANTUS SOLOSTAR) 100 UNIT/ML Solostar Pen Inject 35 Units into the skin daily. 12/25/19   Azzie Glatter, FNP  Insulin Pen Needle 29G X 5MM MISC Use with insulin 08/21/20   Azzie Glatter, FNP  lisinopril (ZESTRIL) 10 MG tablet Take 1 tablet (10 mg total) by mouth daily. 12/25/19 12/24/20  Azzie Glatter, FNP  Vitamin D, Ergocalciferol, (DRISDOL) 1.25 MG (50000 UNIT) CAPS capsule Take 1 capsule (50,000 Units total) by mouth every 7 (seven) days. Patient not taking: No sig reported 12/23/19   Azzie Glatter, FNP    Allergies    Penicillins  Review of Systems   Review of Systems  Constitutional: Negative for diaphoresis and fever.  HENT: Negative for facial swelling.   Eyes: Negative for visual disturbance.  Respiratory: Positive for shortness of breath. Negative for hemoptysis.   Cardiovascular: Positive for leg swelling. Negative for palpitations and syncope.  Gastrointestinal: Negative for abdominal pain.  Genitourinary: Negative for difficulty urinating.  Musculoskeletal: Negative for neck pain.  Skin: Negative for rash.  Neurological: Negative for syncope.  Psychiatric/Behavioral: Negative for agitation.  All other systems reviewed and are negative.   Physical Exam Updated Vital Signs BP 132/81 (BP Location: Right Arm)   Pulse 100   Temp 98.1 F (36.7 C) (Oral)   Resp 18   SpO2 99%   Physical Exam  ED Results / Procedures / Treatments   Labs (all labs ordered are listed, but only abnormal results are displayed) Results for orders placed or performed during the hospital encounter of 09/24/20  CBC  Result Value Ref Range   WBC 7.1 4.0 - 10.5 K/uL   RBC 4.14 3.87 - 5.11 MIL/uL   Hemoglobin 11.5 (L) 12.0 - 15.0 g/dL    HCT 35.4 (L) 36.0 - 46.0 %   MCV 85.5 80.0 - 100.0 fL   MCH 27.8 26.0 - 34.0 pg   MCHC 32.5 30.0 - 36.0 g/dL   RDW 13.4 11.5 - 15.5 %   Platelets 307 150 - 400 K/uL   nRBC 0.0 0.0 - 0.2 %  Basic metabolic panel  Result Value Ref Range   Sodium 138 135 - 145 mmol/L  Potassium 3.6 3.5 - 5.1 mmol/L   Chloride 101 98 - 111 mmol/L   CO2 27 22 - 32 mmol/L   Glucose, Bld 89 70 - 99 mg/dL   BUN 14 6 - 20 mg/dL   Creatinine, Ser 0.62 0.44 - 1.00 mg/dL   Calcium 9.2 8.9 - 10.3 mg/dL   GFR, Estimated >60 >60 mL/min   Anion gap 10 5 - 15  I-Stat Beta hCG blood, ED ([facility], [facility], [facility] only)  Result Value Ref Range   I-stat hCG, quantitative <5.0 <5 mIU/mL   Comment 3          Troponin I (High Sensitivity)  Result Value Ref Range   Troponin I (High Sensitivity) 1,390 (HH) <18 ng/L   DG Chest 2 View  Result Date: 09/24/2020 CLINICAL DATA:  Shortness of breath for 2 weeks, progressive worsening EXAM: CHEST - 2 VIEW COMPARISON:  12/02/2019 FINDINGS: Frontal and lateral views of the chest demonstrate an unremarkable cardiac silhouette. Blunting of the left lateral costophrenic angle may reflect pleural thickening or trace effusion. Patchy right basilar airspace disease. No pneumothorax. No acute bony abnormalities. IMPRESSION: 1. Trace left pleural effusion versus pleural thickening. 2. Patchy right basilar airspace disease. Electronically Signed   By: Randa Ngo M.D.   On: 09/24/2020 17:57   CT Angio Chest PE W and/or Wo Contrast  Result Date: 09/25/2020 CLINICAL DATA:  Chest pain, shortness of breath, pleurisy EXAM: CT ANGIOGRAPHY CHEST WITH CONTRAST TECHNIQUE: Multidetector CT imaging of the chest was performed using the standard protocol during bolus administration of intravenous contrast. Multiplanar CT image reconstructions and MIPs were obtained to evaluate the vascular anatomy. CONTRAST:  55mL OMNIPAQUE IOHEXOL 350 MG/ML SOLN COMPARISON:  Chest x-ray 12/01/2008, chest x-ray 09/24/2020  FINDINGS: Cardiovascular: Satisfactory opacification of the pulmonary arteries to the segmental level. No evidence of pulmonary embolism. Normal heart size. No significant pericardial effusion. The thoracic aorta is normal in caliber. No atherosclerotic plaque of the thoracic aorta. No coronary artery calcifications. Mediastinum/Nodes: Bilateral hilar lymphadenopathy: 1.5 on the right (5:59) and 1.1 on the left (5: 52). Mediastinal lymphadenopathy: As an example a prevascular 1.1 cm lymph node (5:41) and a 1.1 cm precarinal lymph node (5:50). Axillary lymphadenopathy: A 1.4 cm right axillary lymph node (5:57) and a 1 cm left axial lymph node (5:44). No enlarged axillary lymph nodes. Thyroid gland, trachea, and esophagus demonstrate no significant findings. Lungs/Pleura: Patchy ground-glass and consolidative airspace opacities within the lungs that are most prominent within the lower lobes. No pleural effusion. No pneumothorax. Upper Abdomen: No acute abnormality. Musculoskeletal: No chest wall abnormality. No acute or significant osseous findings. Review of the MIP images confirms the above findings. IMPRESSION: 1. Mediastinal, bilateral hilar, bilateral axillary lymphadenopathy. 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 2. Multifocal ground-glass airspace opacities that could represent a multifocal pneumonia (such as COVID 19 infection) versus inflammation. 3. No pulmonary embolus. Electronically Signed   By: Iven Finn M.D.   On: 09/25/2020 02:34    EKG EKG Interpretation  Date/Time:  Monday September 24 2020 17:23:23 EST Ventricular Rate:  115 PR Interval:  168 QRS Duration: 64 QT Interval:  308 QTC Calculation: 426 R Axis:   -2 Text Interpretation: Sinus tachycardia Possible Left atrial enlargement Anteroseptal infarct , age undetermined ST & T wave abnormality, consider lateral ischemia Abnormal ECG Confirmed by Noemi Chapel [phone]) on 09/24/2020 9:31:26 PM   Radiology DG  Chest 2 View  Result Date: 09/24/2020 CLINICAL DATA:  Shortness of breath for 2 weeks,  progressive worsening EXAM: CHEST - 2 VIEW COMPARISON:  12/02/2019 FINDINGS: Frontal and lateral views of the chest demonstrate an unremarkable cardiac silhouette. Blunting of the left lateral costophrenic angle may reflect pleural thickening or trace effusion. Patchy right basilar airspace disease. No pneumothorax. No acute bony abnormalities. IMPRESSION: 1. Trace left pleural effusion versus pleural thickening. 2. Patchy right basilar airspace disease. Electronically Signed   By: Randa Ngo M.D.   On: 09/24/2020 17:57   CT Angio Chest PE W and/or Wo Contrast  Result Date: 09/25/2020 CLINICAL DATA:  Chest pain, shortness of breath, pleurisy EXAM: CT ANGIOGRAPHY CHEST WITH CONTRAST TECHNIQUE: Multidetector CT imaging of the chest was performed using the standard protocol during bolus administration of intravenous contrast. Multiplanar CT image reconstructions and MIPs were obtained to evaluate the vascular anatomy. CONTRAST:  59mL OMNIPAQUE IOHEXOL 350 MG/ML SOLN COMPARISON:  Chest x-ray 12/01/2008, chest x-ray 09/24/2020 FINDINGS: Cardiovascular: Satisfactory opacification of the pulmonary arteries to the segmental level. No evidence of pulmonary embolism. Normal heart size. No significant pericardial effusion. The thoracic aorta is normal in caliber. No atherosclerotic plaque of the thoracic aorta. No coronary artery calcifications. Mediastinum/Nodes: Bilateral hilar lymphadenopathy: 1.5 on the right (5:59) and 1.1 on the left (5: 52). Mediastinal lymphadenopathy: As an example a prevascular 1.1 cm lymph node (5:41) and a 1.1 cm precarinal lymph node (5:50). Axillary lymphadenopathy: A 1.4 cm right axillary lymph node (5:57) and a 1 cm left axial lymph node (5:44). No enlarged axillary lymph nodes. Thyroid gland, trachea, and esophagus demonstrate no significant findings. Lungs/Pleura: Patchy ground-glass and  consolidative airspace opacities within the lungs that are most prominent within the lower lobes. No pleural effusion. No pneumothorax. Upper Abdomen: No acute abnormality. Musculoskeletal: No chest wall abnormality. No acute or significant osseous findings. Review of the MIP images confirms the above findings. IMPRESSION: 1. Mediastinal, bilateral hilar, bilateral axillary lymphadenopathy. 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 2. Multifocal ground-glass airspace opacities that could represent a multifocal pneumonia (such as COVID 19 infection) versus inflammation. 3. No pulmonary embolus. Electronically Signed   By: Iven Finn M.D.   On: 09/25/2020 02:34    Procedures Procedures   Medications Ordered in ED Medications  heparin bolus via infusion 4,000 Units (has no administration in time range)  heparin ADULT infusion 100 units/mL (25000 units/272mL) (has no administration in time range)  iohexol (OMNIPAQUE) 350 MG/ML injection 80 mL (80 mLs Intravenous Contrast Given 09/25/20 0220)  aspirin chewable tablet 324 mg (324 mg Oral Given 09/25/20 0238)    ED Course  I have reviewed the triage vital signs and the nursing notes.  Pertinent labs & imaging results that were available during my care of the patient were reviewed by me and considered in my medical decision making (see chart for details).    There was not troponin ordered in triage. Given history I personally ordered troponin just prior to seeing the patient.  It is positive at the 8 hour mark.  Will need admission.    Cardiology consulted immediately upon receipt of troponin results.  Will see the patient, please start heparin  Patient will need admission for troponin and lymphadeopathy seen on CT scan.     MDM Reviewed: previous chart, nursing note and vitals Interpretation: labs, x-ray, ECG and CT scan (positive troponin, LAN by me on CT ) Total time providing critical care: 30-74 minutes (heparin drip and  bolus). This excludes time spent performing separately reportable procedures and services. Consults: cardiology  CRITICAL CARE Performed by:  Kol Consuegra K Rishav Rockefeller-Rasch Total critical care time: 60 minutes Critical care time was exclusive of separately billable procedures and treating other patients. Critical care was necessary to treat or prevent imminent or life-threatening deterioration. Critical care was time spent personally by me on the following activities: development of treatment plan with patient and/or surrogate as well as nursing, discussions with consultants, evaluation of patient's response to treatment, examination of patient, obtaining history from patient or surrogate, ordering and performing treatments and interventions, ordering and review of laboratory studies, ordering and review of radiographic studies, pulse oximetry and re-evaluation of patient's condition.  Final Clinical Impression(s) / ED Diagnoses Final diagnoses:  Mediastinal lymphadenopathy  NSTEMI (non-ST elevated myocardial infarction) ([place])    Admit for NSTEMI and lymphoproliferative disease.  Patient has been informed personally of all results.     Anina Schnake, MD 09/25/20 1855

## 2020-09-25 NOTE — Progress Notes (Signed)
Heart Failure Nurse Navigator Progress Note  PCP: Azzie Glatter, FNP PCP-Cardiologist: none on file Admission Diagnosis: NSTEMI Admitted from: home with adult daughter  Presentation:   Tiffany Le presented with [facility]. Unable to lay flat. Troponin elevated. Cath scheduled for today 2/15, pt currently in ED.  ECHO/ LVEF: ECHO PENDING  Clinical Course:  Past Medical History:  Diagnosis Date  . Diabetes mellitus without complication ([place])   . Diabetic neuropathy ([place]) 02/2020  . Hyperlipidemia age 49  . Hypertension age 41  . Hypocalcemia 11/2019  . Hypokalemia 11/2019  . Iron deficiency anemia   . Obesity   . Proteinuria 12/09/2019     Social History   Socioeconomic History  . Marital status: Single    Spouse name: Not on file  . Number of children: Not on file  . Years of education: Not on file  . Highest education level: Not on file  Occupational History  . Occupation: Event organiser  Tobacco Use  . Smoking status: Never Smoker  . Smokeless tobacco: Never Used  Vaping Use  . Vaping Use: Never used  Substance and Sexual Activity  . Alcohol use: No  . Drug use: No  . Sexual activity: Not Currently    Partners: Male    Comment: husband currently in [place], returning 02/2013  Other Topics Concern  . 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  Lives with 56 year old daughter, 1 cat Husband is living in [place] (citizen there), trying to come to [place] (has been delayed)   Social Determinants of Radio broadcast assistant Strain: Not on file  Food Insecurity: Not on file  Transportation Needs: Not on file  Physical Activity: Not on file  Stress: Not on file  Social Connections: Not on file   High Risk Criteria for Readmission and/or Poor Patient Outcomes:  Heart failure hospital admissions (last 6 months): 1  No Show rate: 35%  Difficult social situation: no  Demonstrates medication  adherence: yes  Primary Language: English  Literacy level: able to read/write.   Items for Follow-up on DC/TOC: -hold at this time.   Update: patient to be evaluated by CVTS for possible CABG.   Navigator available as needed for potential H&V TOC clinic screening.  Pricilla Holm, RN, BSN Heart Failure Nurse Navigator [phone]

## 2020-09-25 NOTE — Progress Notes (Signed)
ANTICOAGULATION CONSULT NOTE - Follow-Up  Pharmacy Consult for Heparin Indication: chest pain/ACS  Patient Measurements: Height: 5\' 4"  (162.6 cm) Weight: 122.5 kg (270 lb) IBW/kg (Calculated) : 54.7 Heparin Dosing Weight: 80 kg  Vital Signs: Temp: 98.3 F (36.8 C) (02/15 0745) Temp Source: Oral (02/15 0745) BP: 114/73 (02/15 0745) Pulse Rate: 97 (02/15 0745)  Labs: Recent Labs    09/24/20 1720 09/25/20 0126 09/25/20 0418  HGB 11.5*  --   --   HCT 35.4*  --   --   PLT 307  --   --   CREATININE 0.62  --   --   TROPONINIHS  --  1,390* 926*    Estimated Creatinine Clearance: 115.9 mL/min (by C-G formula based on SCr of 0.62 mg/dL).  Assessment: 52 YOF who presented on 2/14 with ACS/NSTEMI. Pharmacy consulted to dose heparin for anticoagulation.   Heparin level this morning is therapeutic (HL 0.4, goal of 0.3-0.7). Currently turned off and in cardiac cath for evaluation.   Goal of Therapy:  Heparin level 0.3-0.7 units/ml Monitor platelets by anticoagulation protocol: Yes   Plan:  - F/u plans for [facility] post-cath  Thank you for allowing pharmacy to be a part of this patient's care.  Alycia Rossetti, PharmD, BCPS Clinical Pharmacist Clinical phone for 09/25/2020: [phone] 09/25/2020 10:56 AM   **Pharmacist phone directory can now be found on [email or web address] ([email or web address]).  Listed under [place].

## 2020-09-25 NOTE — Progress Notes (Signed)
Report and care received from [facility]. Patient resting at this time. TR band to Rt Radial stable with no bleeding or hematoma noted. VSS. Will continue to monitor patient.

## 2020-09-25 NOTE — Progress Notes (Signed)
ANTICOAGULATION CONSULT NOTE - Follow-Up  Pharmacy Consult for Heparin Indication: chest pain/ACS  Patient Measurements: Height: 5\' 4"  (162.6 cm) Weight: 109 kg (240 lb 4.8 oz) IBW/kg (Calculated) : 54.7 Heparin Dosing Weight: 80 kg  Vital Signs: Temp: 98 F (36.7 C) (02/15 1330) Temp Source: Oral (02/15 1330) BP: 123/88 (02/15 1330) Pulse Rate: 97 (02/15 1330)  Labs: Recent Labs    09/24/20 1720 09/25/20 0126 09/25/20 0418 09/25/20 0947  HGB 11.5*  --   --   --   HCT 35.4*  --   --   --   PLT 307  --   --   --   HEPARINUNFRC  --   --   --  0.40  CREATININE 0.62  --   --   --   TROPONINIHS  --  1,390* 926*  --     Estimated Creatinine Clearance: 108.2 mL/min (by C-G formula based on SCr of 0.62 mg/dL).  Assessment: 63 YOF who presented on 2/14 with ACS/NSTEMI. She is now s/p cath with multivessel disease. Pharmacy consulted to dose heparin (sheath removed ~ 11:30am)  Goal of Therapy:  Heparin level 0.3-0.7 units/ml Monitor platelets by anticoagulation protocol: Yes   Plan:  - Restart heparin 1300 units/hr at 7:30pm -Daily heparin level and CBC  Hildred Laser, PharmD Clinical Pharmacist **Pharmacist phone directory can now be found on [email or web address] ([email or web address]).  Listed under [place].

## 2020-09-25 NOTE — ED Notes (Signed)
Patient up walking to bathroom, gait steady

## 2020-09-25 NOTE — H&P (Signed)
Cardiology Admission History and Physical:   Patient ID: Tiffany Le MRN: [id number]; DOB: 05/30/1976   Admission date: 09/24/2020  PCP:  Tiffany Le, Tiffany Le  Cardiologist:  No primary care provider on file.  Advanced Practice Provider:  No care team member to display Electrophysiologist:  None   Chief Complaint:  Shortness of breath  Patient Profile:   Tiffany Le is a 45 y.o. female with HTN, HLD, DM who presents with shortness of breath found to have an NSTEMI  History of Present Illness:   Tiffany Le is a 45y/o woman with history of HTN, HLD and DM who presents with two weeks of progressive shortness of breath with exertion. She states that any amount of exertion makes her short of breath. She has been unable to lie flat due to difficulty breathing and is sleeping propped up in a seated position. She has also noticed worsening swelling in her legs that does not resolve at night. She has been compliant with her HCTZ therapy which had previously kept her swelling at [place] but has now increased in the past two weeks. She denies any chest pain, lightheadedness, dizziness or palpitations. She has had a number of family members and friends who have been infected with COVID. She is not vaccinated. She denies any tobacco, alcohol or drug use. She has a strong family history of coronary disease (mother had a 4 vessel bypass, father died of MI).    Past Medical History:  Diagnosis Date  . Diabetes mellitus without complication ([place])   . Diabetic neuropathy ([place]) 02/2020  . Hyperlipidemia age 75  . Hypertension age 46  . Hypocalcemia 11/2019  . Hypokalemia 11/2019  . Iron deficiency anemia   . Obesity   . Proteinuria 12/09/2019    Past Surgical History:  Procedure Laterality Date  . CESAREAN SECTION  3/96  . ORIF FEMUR FRACTURE       Medications Prior to Admission: Prior to Admission medications   Medication Sig Start Date  End Date Taking? Authorizing Provider  albuterol (PROVENTIL HFA) 108 (90 Base) MCG/ACT inhaler Inhale 2 puffs into the lungs every 6 (six) hours as needed for wheezing. 09/12/20  Yes Tiffany Glatter, FNP  amLODipine (NORVASC) 10 MG tablet Take 1 tablet (10 mg total) by mouth daily. 12/25/19  Yes Tiffany Glatter, FNP  aspirin EC 81 MG tablet Take 81 mg by mouth daily.   Yes [provider]  atorvastatin (LIPITOR) 10 MG tablet Take 1 tablet (10 mg total) by mouth daily. 12/25/19  Yes Tiffany Glatter, FNP  cyclobenzaprine (FLEXERIL) 10 MG tablet Take 1 tablet (10 mg total) by mouth 2 (two) times daily as needed for muscle spasms. 12/25/19  Yes Tiffany Glatter, FNP  gabapentin (NEURONTIN) 100 MG capsule Take 1 capsule (100 mg total) by mouth 3 (three) times daily. 08/21/20  Yes Tiffany Glatter, FNP  glipiZIDE (GLUCOTROL) 10 MG tablet TAKE 1 TABLET BY MOUTH TWICE DAILY BEFORE A MEAL 12/25/19  Yes Tiffany Glatter, FNP  hydrochlorothiazide (HYDRODIURIL) 25 MG tablet Take 1 tablet (25 mg total) by mouth daily. 12/25/19  Yes Tiffany Glatter, FNP  insulin glargine (LANTUS SOLOSTAR) 100 UNIT/ML Solostar Pen Inject 35 Units into the skin daily. 12/25/19  Yes Tiffany Glatter, FNP  Insulin Pen Needle 29G X 5MM MISC Use with insulin 08/21/20  Yes Tiffany Glatter, FNP  lisinopril (ZESTRIL) 10 MG tablet Take 1 tablet (10  mg total) by mouth daily. 12/25/19 12/24/20 Yes Tiffany Glatter, FNP  Vitamin D, Ergocalciferol, (DRISDOL) 1.25 MG (50000 UNIT) CAPS capsule Take 1 capsule (50,000 Units total) by mouth every 7 (seven) days. 12/23/19  Yes Tiffany Glatter, FNP     Allergies:    Allergies  Allergen Reactions  . Penicillins Other (See Comments)    Historical Has patient had a PCN reaction causing immediate rash, facial/tongue/throat swelling, SOB or lightheadedness with hypotension: No Has patient had a PCN reaction causing severe rash involving mucus membranes or skin necrosis: No Has patient  had a PCN reaction that required hospitalization: No Has patient had a PCN reaction occurring within the last 10 years: No If all of the above answers are "NO", then may proceed with Cephalosporin use.    Social History:   Social History   Socioeconomic History  . Marital status: Single    Spouse name: Not on file  . Number of children: Not on file  . Years of education: Not on file  . Highest education level: Not on file  Occupational History  . Occupation: Event organiser  Tobacco Use  . Smoking status: Never Smoker  . Smokeless tobacco: Never Used  Vaping Use  . Vaping Use: Never used  Substance and Sexual Activity  . Alcohol use: No  . Drug use: No  . Sexual activity: Not Currently    Partners: Male    Comment: husband currently in [place], returning 02/2013  Other Topics Concern  . 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  Lives with 41 year old daughter, 1 cat Husband is living in [place] (citizen there), trying to come to [place] (has been delayed)   Social Determinants of Radio broadcast assistant Strain: Not on file  Food Insecurity: Not on file  Transportation Needs: Not on file  Physical Activity: Not on file  Stress: Not on file  Social Connections: Not on file  Intimate Partner Violence: Not on file    Family History:   The patient's family history includes Cancer in her cousin and maternal grandmother; Diabetes in her maternal aunt, maternal aunt, and mother; Early death in her father; Healthy in her daughter; Heart disease in her father and mother; Hyperlipidemia in her father and mother; Hypertension in her father and mother.    ROS:  Please see the history of present illness.  All other ROS reviewed and negative.     Physical Exam/Data:   Vitals:   09/25/20 0300 09/25/20 0354 09/25/20 0421 09/25/20 0445  BP: 122/77 122/80  117/83  Pulse:  (!) 116  (!) 41  Resp: (!) 25 20  (!) 23   Temp:      TempSrc:      SpO2:  100%  91%  Weight:   122.5 kg   Height:   5\' 4"  (1.626 m)    No intake or output data in the 24 hours ending 09/25/20 0519 Last 3 Weights 09/25/2020 02/28/2020 12/23/2019  Weight (lbs) 270 lb 248 lb 12.8 oz 248 lb 3.2 oz  Weight (kg) 122.471 kg 112.855 kg 112.583 kg     Body mass index is 46.35 kg/m.  General:  Obese, lying comfortably in bed  HEENT: normal Neck: JVP 2 cm above the clavicle when sitting upright at 90 degrees Vascular: No carotid bruits; FA pulses 2+ bilaterally without bruits  Cardiac:  Tachycardia, regular rhythm, no  murmurs  Lungs:  clear to auscultation bilaterally, no wheezing, rhonchi or rales  Abd: soft, nontender, no hepatomegaly  Ext: 2+ pitting edema bilaterally  Musculoskeletal:  No deformities, BUE and BLE strength normal and equal Skin: warm and dry  Neuro:  CNs 2-12 intact, no focal abnormalities noted Psych:  Normal affect    EKG:  The ECG that was done was personally reviewed and demonstrates sinus tachycardia. HR 115 bpm. Q-waves anteriorly in V1-V4. Nonspecific T wave changes. Poor R wave progression. LA enlargement  Relevant CV Studies: None  Laboratory Data:  High Sensitivity Troponin:   Recent Labs  Lab 09/25/20 0126  TROPONINIHS 1,390*      Chemistry Recent Labs  Lab 09/24/20 1720  NA 138  K 3.6  CL 101  CO2 27  GLUCOSE 89  BUN 14  CREATININE 0.62  CALCIUM 9.2  GFRNONAA >60  ANIONGAP 10    No results for input(s): PROT, ALBUMIN, AST, ALT, ALKPHOS, BILITOT in the last 168 hours. Hematology Recent Labs  Lab 09/24/20 1720  WBC 7.1  RBC 4.14  HGB 11.5*  HCT 35.4*  MCV 85.5  MCH 27.8  MCHC 32.5  RDW 13.4  PLT 307   BNPNo results for input(s): BNP, PROBNP in the last 168 hours.  DDimer No results for input(s): DDIMER in the last 168 hours.   Radiology/Studies:  DG Chest 2 View  Result Date: 09/24/2020 CLINICAL DATA:  Shortness of breath for 2 weeks, progressive worsening EXAM:  CHEST - 2 VIEW COMPARISON:  12/02/2019 FINDINGS: Frontal and lateral views of the chest demonstrate an unremarkable cardiac silhouette. Blunting of the left lateral costophrenic angle may reflect pleural thickening or trace effusion. Patchy right basilar airspace disease. No pneumothorax. No acute bony abnormalities. IMPRESSION: 1. Trace left pleural effusion versus pleural thickening. 2. Patchy right basilar airspace disease. Electronically Signed   By: Randa Ngo M.D.   On: 09/24/2020 17:57   CT Angio Chest PE W and/or Wo Contrast  Result Date: 09/25/2020 CLINICAL DATA:  Chest pain, shortness of breath, pleurisy EXAM: CT ANGIOGRAPHY CHEST WITH CONTRAST TECHNIQUE: Multidetector CT imaging of the chest was performed using the standard protocol during bolus administration of intravenous contrast. Multiplanar CT image reconstructions and MIPs were obtained to evaluate the vascular anatomy. CONTRAST:  60mL OMNIPAQUE IOHEXOL 350 MG/ML SOLN COMPARISON:  Chest x-ray 12/01/2008, chest x-ray 09/24/2020 FINDINGS: Cardiovascular: Satisfactory opacification of the pulmonary arteries to the segmental level. No evidence of pulmonary embolism. Normal heart size. No significant pericardial effusion. The thoracic aorta is normal in caliber. No atherosclerotic plaque of the thoracic aorta. No coronary artery calcifications. Mediastinum/Nodes: Bilateral hilar lymphadenopathy: 1.5 on the right (5:59) and 1.1 on the left (5: 52). Mediastinal lymphadenopathy: As an example a prevascular 1.1 cm lymph node (5:41) and a 1.1 cm precarinal lymph node (5:50). Axillary lymphadenopathy: A 1.4 cm right axillary lymph node (5:57) and a 1 cm left axial lymph node (5:44). No enlarged axillary lymph nodes. Thyroid gland, trachea, and esophagus demonstrate no significant findings. Lungs/Pleura: Patchy ground-glass and consolidative airspace opacities within the lungs that are most prominent within the lower lobes. No pleural effusion. No  pneumothorax. Upper Abdomen: No acute abnormality. Musculoskeletal: No chest wall abnormality. No acute or significant osseous findings. Review of the MIP images confirms the above findings. IMPRESSION: 1. Mediastinal, bilateral hilar, bilateral axillary lymphadenopathy. 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 2. Multifocal ground-glass airspace opacities that could represent a multifocal pneumonia (such as COVID 19 infection) versus inflammation. 3.  No pulmonary embolus. Electronically Signed   By: Iven Finn M.D.   On: 09/25/2020 02:34     Assessment and Plan:   1. NSTEMI- Multiple risk factors for CAD with HTN, HLD, DM and family history. Troponin is significantly elevated. Nonspecific changes on ECG - Echo ordered - NPO for LHC - Heparin drip, ACS nomogram - Increase home Lipitor to 80mg   - Start metoprolol 12.5 mg BID - S/p aspirin 324mg   - Continue aspirin 81mg  daily  2. Lymphadenopathy- CT showing extensive lymphadenopathy. Concern for possible sarcoid. No family history - Outpatient evaluation +/- lymph node biopsy  3. Hypertension - Metoprolol as above - Continue home lisinopril - Hold home HCTZ - Lasix 40mg  IV x 1 - Hold home amlodipine, will increase lisinopril and/or metoprolol instead pending blood pressure  4. Diabetes - Continue home lantus 35 units daily - Hold home glipizide    Risk Assessment/Risk Scores:   TIMI Risk Score for Unstable Angina or Non-ST Elevation MI:   The patient's TIMI risk score is 3, which indicates a 13% risk of all cause mortality, new or recurrent myocardial infarction or need for urgent revascularization in the next 14 days.{   Severity of Illness: The appropriate patient status for this patient is INPATIENT. Inpatient status is judged to be reasonable and necessary in order to provide the required intensity of service to ensure the patient's safety. The patient's presenting symptoms, physical exam findings, and  initial radiographic and laboratory data in the context of their chronic comorbidities is felt to place them at high risk for further clinical deterioration. Furthermore, it is not anticipated that the patient will be medically stable for discharge from the hospital within 2 midnights of admission. The following factors support the patient status of inpatient.     * I certify that at the point of admission it is my clinical judgment that the patient will require inpatient hospital care spanning beyond 2 midnights from the point of admission due to high intensity of service, high risk for further deterioration and high frequency of surveillance required.*    For questions or updates, please contact [place] Please consult [email or web address] for contact info under     Signed, Princella Pellegrini, MD  09/25/2020 5:19 AM

## 2020-09-25 NOTE — Interval H&P Note (Signed)
History and Physical Interval Note:  09/25/2020 10:49 AM  Tiffany Le  has presented today for surgery, with the diagnosis of nstemi.  The various methods of treatment have been discussed with the patient and family. After consideration of risks, benefits and other options for treatment, the patient has consented to  Procedure(s): LEFT HEART CATH AND CORONARY ANGIOGRAPHY (N/A)  PERCUTANEOUS CORONARY INTERVENTION  as a surgical intervention.  The patient's history has been reviewed, patient examined, no change in status, stable for surgery.  I have reviewed the patient's chart and labs.  Questions were answered to the patient's satisfaction.    Cath Lab Visit (complete for each Cath Lab visit)  Clinical Evaluation Leading to the Procedure:   ACS: Yes.    Non-ACS:    Anginal Classification: CCS IV  Anti-ischemic medical therapy: Minimal Therapy (1 class of medications)  Non-Invasive Test Results: No non-invasive testing performed  Prior CABG: No previous CABG    Glenetta Hew

## 2020-09-25 NOTE — Progress Notes (Addendum)
Progress Note  Patient Name: Tiffany Le Date of Encounter: 09/25/2020  [facility] Cardiologist: No primary care provider on file.   Subjective   No chest pain this morning, sitting comfortably in bed  Inpatient Medications    Scheduled Meds: . furosemide  40 mg Intravenous Once   Continuous Infusions: . heparin 1,300 Units/hr (09/25/20 0430)   PRN Meds:    Vital Signs    Vitals:   09/25/20 0354 09/25/20 0421 09/25/20 0445 09/25/20 0745  BP: 122/80  117/83 114/73  Pulse: (!) 116  (!) 41 97  Resp: 20  (!) 23 15  Temp:    98.3 F (36.8 C)  TempSrc:    Oral  SpO2: 100%  91% 95%  Weight:  122.5 kg    Height:  5\' 4"  (1.626 m)     No intake or output data in the 24 hours ending 09/25/20 0831 Last 3 Weights 09/25/2020 02/28/2020 12/23/2019  Weight (lbs) 270 lb 248 lb 12.8 oz 248 lb 3.2 oz  Weight (kg) 122.471 kg 112.855 kg 112.583 kg      Telemetry    Sinus rhythm - Personally Reviewed  ECG    Sinus tachycardia, 111 bpm, nonspecific ST changes in lateral leads- Personally Reviewed  Physical Exam   GEN: No acute distress.   Neck: No JVD Cardiac: RRR, no murmurs, rubs, or gallops.  Respiratory: Clear to auscultation bilaterally. GI: Soft, nontender, non-distended  MS: No edema; No deformity. Neuro:  Nonfocal  Psych: Normal affect   Labs    High Sensitivity Troponin:   Recent Labs  Lab 09/25/20 0126 09/25/20 0418  TROPONINIHS 1,390* 926*      Chemistry Recent Labs  Lab 09/24/20 1720  NA 138  K 3.6  CL 101  CO2 27  GLUCOSE 89  BUN 14  CREATININE 0.62  CALCIUM 9.2  GFRNONAA >60  ANIONGAP 10     Hematology Recent Labs  Lab 09/24/20 1720  WBC 7.1  RBC 4.14  HGB 11.5*  HCT 35.4*  MCV 85.5  MCH 27.8  MCHC 32.5  RDW 13.4  PLT 307    BNPNo results for input(s): BNP, PROBNP in the last 168 hours.   DDimer No results for input(s): DDIMER in the last 168 hours.   Radiology    DG Chest 2 View  Result Date:  09/24/2020 CLINICAL DATA:  Shortness of breath for 2 weeks, progressive worsening EXAM: CHEST - 2 VIEW COMPARISON:  12/02/2019 FINDINGS: Frontal and lateral views of the chest demonstrate an unremarkable cardiac silhouette. Blunting of the left lateral costophrenic angle may reflect pleural thickening or trace effusion. Patchy right basilar airspace disease. No pneumothorax. No acute bony abnormalities. IMPRESSION: 1. Trace left pleural effusion versus pleural thickening. 2. Patchy right basilar airspace disease. Electronically Signed   By: Randa Ngo M.D.   On: 09/24/2020 17:57   CT Angio Chest PE W and/or Wo Contrast  Result Date: 09/25/2020 CLINICAL DATA:  Chest pain, shortness of breath, pleurisy EXAM: CT ANGIOGRAPHY CHEST WITH CONTRAST TECHNIQUE: Multidetector CT imaging of the chest was performed using the standard protocol during bolus administration of intravenous contrast. Multiplanar CT image reconstructions and MIPs were obtained to evaluate the vascular anatomy. CONTRAST:  14mL OMNIPAQUE IOHEXOL 350 MG/ML SOLN COMPARISON:  Chest x-ray 12/01/2008, chest x-ray 09/24/2020 FINDINGS: Cardiovascular: Satisfactory opacification of the pulmonary arteries to the segmental level. No evidence of pulmonary embolism. Normal heart size. No significant pericardial effusion. The thoracic aorta is normal in caliber. No  atherosclerotic plaque of the thoracic aorta. No coronary artery calcifications. Mediastinum/Nodes: Bilateral hilar lymphadenopathy: 1.5 on the right (5:59) and 1.1 on the left (5: 52). Mediastinal lymphadenopathy: As an example a prevascular 1.1 cm lymph node (5:41) and a 1.1 cm precarinal lymph node (5:50). Axillary lymphadenopathy: A 1.4 cm right axillary lymph node (5:57) and a 1 cm left axial lymph node (5:44). No enlarged axillary lymph nodes. Thyroid gland, trachea, and esophagus demonstrate no significant findings. Lungs/Pleura: Patchy ground-glass and consolidative airspace opacities  within the lungs that are most prominent within the lower lobes. No pleural effusion. No pneumothorax. Upper Abdomen: No acute abnormality. Musculoskeletal: No chest wall abnormality. No acute or significant osseous findings. Review of the MIP images confirms the above findings. IMPRESSION: 1. Mediastinal, bilateral hilar, bilateral axillary lymphadenopathy. 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 2. Multifocal ground-glass airspace opacities that could represent a multifocal pneumonia (such as COVID 19 infection) versus inflammation. 3. No pulmonary embolus. Electronically Signed   By: Iven Finn M.D.   On: 09/25/2020 02:34    Cardiac Studies   Echo: pending  Patient Profile     45 y.o. female with HTN, HLD, DM who presents with shortness of breath found to have an NSTEMI  Assessment & Plan    1. NSTEMI: hsTn 1390, now down trending at 925. She remains pain free this morning. EKG with nonspecific changes.  -- remains on IV heparin -- plans for cardiac cath today The patient understands that risks included but are not limited to stroke (1 in 1000), death (1 in 1000), kidney failure [usually temporary] (1 in 500), bleeding (1 in 200), allergic reaction [possibly serious] (1 in 200).   2. HTN: blood pressures stable in the ED -- continue on metoprolol -- will hold lisinopril/HCTZ and amlodipine as blood pressures are soft to allow for metoprolol  3. DM: Hgb A1c 8.3 (7/21) -- holding home oral meds -- SSI while in patient  4. HLD: check lipids -- increase atorvastatin 80mg  daily  5. Lymphadenopathy: mediastinal, bilateral hilar, bilateral axillary lymphadenopathy. Concerning for lymphoproliferative disorder/  -- likely outpatient evaluation     For questions or updates, please contact [facility] Please consult [email or web address] for contact info under   Signed, Reino Bellis, NP  09/25/2020, 8:31 AM    Agree with note by Reino Bellis NP-C  Tiffany Le was  admitted with non-STEMI.  Her troponin is increased to about 1000.  Her EKG shows nonspecific changes.  She does have positive risk factors including family history, diabetes, hypertension hyperlipidemia.  She has had progressive dyspnea over the last several weeks with orthopnea that became worse yesterday.  She is currently asymptomatic.  She denies chest pain.  She is on IV heparin as well as aspirin, beta-blocker and a statin drug.  Her exam is benign.  She is scheduled for right left heart cath this morning.  I suspect she will have multivessel disease.  I have reviewed the risks, indications, and alternatives to cardiac catheterization, possible angioplasty, and stenting with the patient. Risks include but are not limited to bleeding, infection, vascular injury, stroke, myocardial infection, arrhythmia, kidney injury, radiation-related injury in the case of prolonged fluoroscopy use, emergency cardiac surgery, and death. The patient understands the risks of serious complication is 1-2 in 5809 with diagnostic cardiac cath and 1-2% or less with angioplasty/stenting.   Lorretta Harp, M.D., [place], [facility], [place] [address] [place], [place]  [id number]  [phone] 09/25/2020  9:37 AM

## 2020-09-25 NOTE — Progress Notes (Signed)
TCTS consulted for CABG evaluation. °

## 2020-09-25 NOTE — ED Notes (Signed)
md notified of critical troponin

## 2020-09-26 ENCOUNTER — Inpatient Hospital Stay (HOSPITAL_COMMUNITY): Payer: Medicaid Other

## 2020-09-26 ENCOUNTER — Other Ambulatory Visit (HOSPITAL_COMMUNITY): Payer: Self-pay

## 2020-09-26 ENCOUNTER — Telehealth: Payer: Self-pay | Admitting: Family Medicine

## 2020-09-26 ENCOUNTER — Encounter (HOSPITAL_COMMUNITY): Payer: Self-pay | Admitting: Cardiology

## 2020-09-26 ENCOUNTER — Encounter (HOSPITAL_COMMUNITY): Payer: Self-pay | Admitting: Anesthesiology

## 2020-09-26 DIAGNOSIS — I214 Non-ST elevation (NSTEMI) myocardial infarction: Secondary | ICD-10-CM

## 2020-09-26 DIAGNOSIS — I255 Ischemic cardiomyopathy: Secondary | ICD-10-CM

## 2020-09-26 DIAGNOSIS — I2510 Atherosclerotic heart disease of native coronary artery without angina pectoris: Secondary | ICD-10-CM

## 2020-09-26 LAB — PULMONARY FUNCTION TEST
FEF 25-75 Pre: 1.39 L/sec
FEF2575-%Pred-Pre: 51 %
FEV1-%Pred-Pre: 58 %
FEV1-Pre: 1.44 L
FEV1FVC-%Pred-Pre: 96 %
FEV6-%Pred-Pre: 60 %
FEV6-Pre: 1.8 L
FEV6FVC-%Pred-Pre: 102 %
FVC-%Pred-Pre: 59 %
FVC-Pre: 1.8 L
Pre FEV1/FVC ratio: 80 %
Pre FEV6/FVC Ratio: 100 %

## 2020-09-26 LAB — GLUCOSE, CAPILLARY
Glucose-Capillary: 171 mg/dL — ABNORMAL HIGH (ref 70–99)
Glucose-Capillary: 152 mg/dL — ABNORMAL HIGH (ref 70–99)
Glucose-Capillary: 124 mg/dL — ABNORMAL HIGH (ref 70–99)
Glucose-Capillary: 170 mg/dL — ABNORMAL HIGH (ref 70–99)

## 2020-09-26 LAB — CBC
HCT: 31.4 % — ABNORMAL LOW (ref 36.0–46.0)
Hemoglobin: 10.3 g/dL — ABNORMAL LOW (ref 12.0–15.0)
MCH: 27.7 pg (ref 26.0–34.0)
MCHC: 32.8 g/dL (ref 30.0–36.0)
MCV: 84.4 fL (ref 80.0–100.0)
Platelets: 296 10*3/uL (ref 150–400)
RBC: 3.72 MIL/uL — ABNORMAL LOW (ref 3.87–5.11)
RDW: 13.4 % (ref 11.5–15.5)
WBC: 5.8 10*3/uL (ref 4.0–10.5)
nRBC: 0 % (ref 0.0–0.2)

## 2020-09-26 LAB — HEPARIN LEVEL (UNFRACTIONATED)
Heparin Unfractionated: 0.26 IU/mL — ABNORMAL LOW (ref 0.30–0.70)
Heparin Unfractionated: 0.38 IU/mL (ref 0.30–0.70)

## 2020-09-26 MED ORDER — DEXMEDETOMIDINE HCL IN NACL 400 MCG/100ML IV SOLN
0.1000 ug/kg/h | INTRAVENOUS | Status: DC
  Filled 2020-09-26: qty 100

## 2020-09-26 MED ORDER — POTASSIUM CHLORIDE 2 MEQ/ML IV SOLN
80.0000 meq | INTRAVENOUS | Status: DC
  Filled 2020-09-26: qty 40

## 2020-09-26 MED ORDER — PHENYLEPHRINE HCL-NACL 20-0.9 MG/250ML-% IV SOLN
30.0000 ug/min | INTRAVENOUS | Status: DC
  Filled 2020-09-26: qty 250

## 2020-09-26 MED ORDER — METOPROLOL TARTRATE 25 MG PO TABS
25.0000 mg | ORAL_TABLET | Freq: Two times a day (BID) | ORAL | Status: DC
  Administered 2020-09-26 – 2020-09-27 (×2): 25 mg via ORAL
  Filled 2020-09-26 (×2): qty 1

## 2020-09-26 MED ORDER — LEVOFLOXACIN IN D5W 500 MG/100ML IV SOLN
500.0000 mg | INTRAVENOUS | Status: DC
  Filled 2020-09-26: qty 100

## 2020-09-26 MED ORDER — TRANEXAMIC ACID (OHS) PUMP PRIME SOLUTION
2.0000 mg/kg | INTRAVENOUS | Status: DC
  Filled 2020-09-26: qty 2.21

## 2020-09-26 MED ORDER — INSULIN REGULAR(HUMAN) IN NACL 100-0.9 UT/100ML-% IV SOLN
INTRAVENOUS | Status: DC
  Filled 2020-09-26: qty 100

## 2020-09-26 MED ORDER — FUROSEMIDE 10 MG/ML IJ SOLN
40.0000 mg | Freq: Once | INTRAMUSCULAR | Status: AC
  Administered 2020-09-26: 40 mg via INTRAVENOUS
  Filled 2020-09-26: qty 4

## 2020-09-26 MED ORDER — METOPROLOL TARTRATE 12.5 MG HALF TABLET
12.5000 mg | ORAL_TABLET | Freq: Once | ORAL | Status: AC
  Administered 2020-09-26: 12.5 mg via ORAL
  Filled 2020-09-26: qty 1

## 2020-09-26 MED ORDER — TRANEXAMIC ACID 1000 MG/10ML IV SOLN
1.5000 mg/kg/h | INTRAVENOUS | Status: DC
  Filled 2020-09-26: qty 25

## 2020-09-26 MED ORDER — TRANEXAMIC ACID (OHS) BOLUS VIA INFUSION
15.0000 mg/kg | INTRAVENOUS | Status: DC
  Filled 2020-09-26: qty 1656

## 2020-09-26 MED ORDER — PLASMA-LYTE 148 IV SOLN
INTRAVENOUS | Status: DC
  Filled 2020-09-26: qty 2.5

## 2020-09-26 MED ORDER — NITROGLYCERIN IN D5W 200-5 MCG/ML-% IV SOLN
2.0000 ug/min | INTRAVENOUS | Status: DC
  Filled 2020-09-26: qty 250

## 2020-09-26 MED ORDER — EPINEPHRINE HCL 5 MG/250ML IV SOLN IN NS
0.0000 ug/min | INTRAVENOUS | Status: DC
  Filled 2020-09-26: qty 250

## 2020-09-26 MED ORDER — MAGNESIUM SULFATE 50 % IJ SOLN
40.0000 meq | INTRAMUSCULAR | Status: DC
  Filled 2020-09-26: qty 9.85

## 2020-09-26 MED ORDER — MILRINONE LACTATE IN DEXTROSE 20-5 MG/100ML-% IV SOLN
0.3000 ug/kg/min | INTRAVENOUS | Status: DC
  Filled 2020-09-26: qty 100

## 2020-09-26 MED ORDER — INSULIN ASPART 100 UNIT/ML ~~LOC~~ SOLN
0.0000 [IU] | Freq: Three times a day (TID) | SUBCUTANEOUS | Status: DC
  Administered 2020-09-26: 2 [IU] via SUBCUTANEOUS
  Administered 2020-09-26 – 2020-09-27 (×3): 3 [IU] via SUBCUTANEOUS

## 2020-09-26 MED ORDER — NOREPINEPHRINE 4 MG/250ML-% IV SOLN
0.0000 ug/min | INTRAVENOUS | Status: DC
  Filled 2020-09-26: qty 250

## 2020-09-26 MED ORDER — VANCOMYCIN HCL 1500 MG/300ML IV SOLN
1500.0000 mg | INTRAVENOUS | Status: DC
  Filled 2020-09-26: qty 300

## 2020-09-26 MED ORDER — SODIUM CHLORIDE 0.9 % IV SOLN
INTRAVENOUS | Status: DC
  Filled 2020-09-26: qty 30

## 2020-09-26 NOTE — Progress Notes (Addendum)
Progress Note  Patient Name: Tiffany Le Date of Encounter: 09/26/2020  [facility] Cardiologist: Tiffany Burow, MD   Subjective   No chest pain this morning. Seen by TCTS yesterday with recommendations for CABG. She is unsure what she wants to do. Quite anxious about things. Wants time to think about it.   Inpatient Medications    Scheduled Meds: . aspirin EC  81 mg Oral Daily  . atorvastatin  80 mg Oral q1800  . furosemide  40 mg Intravenous Once  . gabapentin  100 mg Oral TID  . insulin aspart  0-15 Units Subcutaneous TID WC  . insulin glargine  35 Units Subcutaneous Daily  . lisinopril  10 mg Oral Daily  . metoprolol tartrate  12.5 mg Oral Once  . metoprolol tartrate  25 mg Oral BID  . sodium chloride flush  3 mL Intravenous Q12H   Continuous Infusions: . sodium chloride    . heparin 1,400 Units/hr (09/26/20 0535)   PRN Meds: sodium chloride, acetaminophen, nitroGLYCERIN, ondansetron (ZOFRAN) IV, sodium chloride flush   Vital Signs    Vitals:   09/25/20 2100 09/25/20 2156 09/26/20 0343 09/26/20 0913  BP:  119/79 118/83 112/74  Pulse: (!) 110 99 88 (!) 104  Resp: 20 19 16    Temp:  98.4 F (36.9 C) 98.3 F (36.8 C)   TempSrc:  Oral Oral   SpO2: 100% 98% 100%   Weight:   110.4 kg   Height:        Intake/Output Summary (Last 24 hours) at 09/26/2020 0952 Last data filed at 09/26/2020 0800 Gross per 24 hour  Intake 503.94 ml  Output 250 ml  Net 253.94 ml   Last 3 Weights 09/26/2020 09/25/2020 09/25/2020  Weight (lbs) 243 lb 6.4 oz 240 lb 4.8 oz 270 lb  Weight (kg) 110.406 kg 109 kg 122.471 kg      Telemetry    ST rates 100s - Personally Reviewed  Physical Exam  Pleasant younger AAF, sitting up in the chair GEN: No acute distress.   Neck: No JVD Cardiac: RRR, no murmurs, rubs, or gallops.  Respiratory: Clear to auscultation bilaterally. GI: Soft, nontender, non-distended  MS: No edema; No deformity. Right radial cath site stable. Neuro:   Nonfocal  Psych: Normal affect   Labs    High Sensitivity Troponin:   Recent Labs  Lab 09/25/20 0126 09/25/20 0418  TROPONINIHS 1,390* 926*      Chemistry Recent Labs  Lab 09/24/20 1720  NA 138  K 3.6  CL 101  CO2 27  GLUCOSE 89  BUN 14  CREATININE 0.62  CALCIUM 9.2  GFRNONAA >60  ANIONGAP 10     Hematology Recent Labs  Lab 09/24/20 1720 09/26/20 0253  WBC 7.1 5.8  RBC 4.14 3.72*  HGB 11.5* 10.3*  HCT 35.4* 31.4*  MCV 85.5 84.4  MCH 27.8 27.7  MCHC 32.5 32.8  RDW 13.4 13.4  PLT 307 296    BNPNo results for input(s): BNP, PROBNP in the last 168 hours.   DDimer No results for input(s): DDIMER in the last 168 hours.   Radiology    DG Chest 2 View  Result Date: 09/24/2020 CLINICAL DATA:  Shortness of breath for 2 weeks, progressive worsening EXAM: CHEST - 2 VIEW COMPARISON:  12/02/2019 FINDINGS: Frontal and lateral views of the chest demonstrate an unremarkable cardiac silhouette. Blunting of the left lateral costophrenic angle may reflect pleural thickening or trace effusion. Patchy right basilar airspace disease. No pneumothorax.  No acute bony abnormalities. IMPRESSION: 1. Trace left pleural effusion versus pleural thickening. 2. Patchy right basilar airspace disease. Electronically Signed   By: Tiffany Le M.D.   On: 09/24/2020 17:57   CT Angio Chest PE W and/or Wo Contrast  Result Date: 09/25/2020 CLINICAL DATA:  Chest pain, shortness of breath, pleurisy EXAM: CT ANGIOGRAPHY CHEST WITH CONTRAST TECHNIQUE: Multidetector CT imaging of the chest was performed using the standard protocol during bolus administration of intravenous contrast. Multiplanar CT image reconstructions and MIPs were obtained to evaluate the vascular anatomy. CONTRAST:  59mL OMNIPAQUE IOHEXOL 350 MG/ML SOLN COMPARISON:  Chest x-ray 12/01/2008, chest x-ray 09/24/2020 FINDINGS: Cardiovascular: Satisfactory opacification of the pulmonary arteries to the segmental level. No evidence of  pulmonary embolism. Normal heart size. No significant pericardial effusion. The thoracic aorta is normal in caliber. No atherosclerotic plaque of the thoracic aorta. No coronary artery calcifications. Mediastinum/Nodes: Bilateral hilar lymphadenopathy: 1.5 on the right (5:59) and 1.1 on the left (5: 52). Mediastinal lymphadenopathy: As an example a prevascular 1.1 cm lymph node (5:41) and a 1.1 cm precarinal lymph node (5:50). Axillary lymphadenopathy: A 1.4 cm right axillary lymph node (5:57) and a 1 cm left axial lymph node (5:44). No enlarged axillary lymph nodes. Thyroid gland, trachea, and esophagus demonstrate no significant findings. Lungs/Pleura: Patchy ground-glass and consolidative airspace opacities within the lungs that are most prominent within the lower lobes. No pleural effusion. No pneumothorax. Upper Abdomen: No acute abnormality. Musculoskeletal: No chest wall abnormality. No acute or significant osseous findings. Review of the MIP images confirms the above findings. IMPRESSION: 1. Mediastinal, bilateral hilar, bilateral axillary lymphadenopathy. 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 2. Multifocal ground-glass airspace opacities that could represent a multifocal pneumonia (such as COVID 19 infection) versus inflammation. 3. No pulmonary embolus. Electronically Signed   By: Tiffany Le M.D.   On: 09/25/2020 02:34   CARDIAC CATHETERIZATION  Result Date: 09/26/2020  Prox LAD to Mid LAD lesion is 95% stenosed. Mid LAD is 70% stenosed  Dist LAD lesion is 50% stenosed.  Ramus-1 lesion is 95% stenosed with 70% stenosed side branch in Lat Ramus. Beyond the bifurcation, Ramus-2 lesion is 60% stenosis  Prox Cx to Dist Cx lesion is 65% stenosed with 70% stenosed side branch in 1st Mrg.  LPAV lesion is 70% stenosed.  Prox RCA-1 lesion is 60% stenosed.  Prox RCA-2 lesion is 85% stenosed.  There is mild to moderate left ventricular systolic dysfunction. EF estimated 40 to 13%   LV end diastolic pressure is severely elevated. 33 mmHg  There is no aortic valve stenosis.  SUMMARY  Severe multivessel CAD: Early mid LAD 95% followed by 70%, bifurcation high OM1 95%, bifurcation LCx-OM2 (65-70%), small caliber codominant RCA with 85% mid stenosis  ACUTE COMBINED SYSTOLIC AND DIASTOLIC HEART FAILURE  mild to moderate reduced EF of roughly 40 to 45% with apical akinesis as well as anterior wall hypokinesis.  severely elevated LVEDP of 33 mmHg RECOMMENDATIONS  Restart IV heparin 8 hours after sheath removal.  CVTS consultation given diabetes and multivessel CAD.  PCI options would be LAD stenting of both lesions and then stenting of the high OM/ramus across the sidebranch with provisional PTCA; would treat the LCx and midRCA medically Tiffany Hew, MD    Cardiac Studies   Cath: 09/25/20   Prox LAD to Mid LAD lesion is 95% stenosed. Mid LAD is 70% stenosed  Dist LAD lesion is 50% stenosed.  Ramus-1 lesion is 95% stenosed with 70% stenosed side branch  in Lat Ramus. Beyond the bifurcation, Ramus-2 lesion is 60% stenosis  Prox Cx to Dist Cx lesion is 65% stenosed with 70% stenosed side branch in 1st Mrg.  LPAV lesion is 70% stenosed.  Prox RCA-1 lesion is 60% stenosed.  Prox RCA-2 lesion is 85% stenosed.  There is mild to moderate left ventricular systolic dysfunction. EF estimated 40 to 21%  LV end diastolic pressure is severely elevated. 33 mmHg  There is no aortic valve stenosis.   SUMMARY  Severe multivessel CAD: Early mid LAD 95% followed by 70%, bifurcation high OM1 95%, bifurcation LCx-OM2 (65-70%), small caliber codominant RCA with 85% mid stenosis  ACUTE COMBINED SYSTOLIC AND DIASTOLIC HEART FAILURE ? mild to moderate reduced EF of roughly 40 to 45% with apical akinesis as well as anterior wall hypokinesis. ? severely elevated LVEDP of 33 mmHg   RECOMMENDATIONS  Restart IV heparin 8 hours after sheath removal.  CVTS consultation given diabetes  and multivessel CAD.  PCI options would be LAD stenting of both lesions and then stenting of the high OM/ramus across the sidebranch with provisional PTCA; would treat the LCx and midRCA medically  Tiffany Hew, MD  Diagnostic Dominance: Co-dominant     Patient Profile     45 y.o. female with HTN, HLD, DM who presents with shortness of breath found to have an NSTEMI  Assessment & Plan    1. NSTEMI: hsTn 1390, now down trending at 925. Underwent cardiac cath noted above with severe multivessel CAD, mLAD 95%, 70% bifurcation high OM1 95%, bifurcation Lcx-OM2 65-70% and small caliber codominant RCA with 85% stenosis. Recommendations for TCTS consult. Seen by Tiffany Le Seen but patient is unsure what she wants to do. Asking to have a week to think over things. -- remains on IV heparin -- ASA, statin, BB  2. HTN: stable with current regimen -- HR is elevated, will increase metoprolol to 25mg  daily, continue lisinopril 10mg  daily   3. DM: Hgb A1c 8.3 (7/21), pending this admission -- holding home oral meds -- SSI while in patient  4. HLD: LDL 91 -- increased atorvastatin 80mg  daily  5. Lymphadenopathy: mediastinal, bilateral hilar, bilateral axillary lymphadenopathy. Concerning for lymphoproliferative disorder/  -- likely outpatient evaluation    6. ICM: EF noted at 40-45% on LV gram. EDP elevated on cath with LE edema -- will give IV lasix 40mg  x1 -- echo pending -- on BB and ACEI  For questions or updates, please contact [place] Please consult [email or web address] for contact info under        Signed, Tiffany Bellis, NP  09/26/2020, 9:52 AM    Agree with note by Tiffany Bellis NP-C  Patient admitted with dyspnea.  Her troponins were elevated at 900.  She had a cath performed yesterday showed three-vessel disease with moderate LV dysfunction.  EF appeared to be in a 35% range.  Her neck anatomy was surgical.  I reviewed this with Dr. Ellyn Hack.  Dr. Julien Girt has seen her  as well and agrees.  She is on IV heparin.  Hopefully she will agree to bypass surgery later this week.  She is currently asymptomatic.  On metoprolol twice daily and lisinopril which was just recently started.  Lorretta Harp, M.D., [place], [facility], [place] [address] [place], [place]  [id number]  [phone] 09/26/2020 11:13 AM

## 2020-09-26 NOTE — Telephone Encounter (Signed)
Pt called states wants to cancel coronary bypass tomorrow until she can talk to her PCP Clovis Riley.  Pt said she had no idea anything was wrong and does not feel comfortable proceeding until talking to PCP. Call pt CELL [phone].

## 2020-09-26 NOTE — Progress Notes (Signed)
  Echocardiogram 2D Echocardiogram has been performed.  Tiffany Le 09/26/2020, 5:05 PM

## 2020-09-26 NOTE — Plan of Care (Signed)
  Problem: Education: Goal: Knowledge of General Education information will improve Description Including pain rating scale, medication(s)/side effects and non-pharmacologic comfort measures Outcome: Progressing   

## 2020-09-26 NOTE — Progress Notes (Signed)
1400 Talked with pt's RN. Will continue to follow pt, and see when pt has made decision regarding PCI or CABG. Graylon Good RN BSN 09/26/2020 2:01 PM

## 2020-09-26 NOTE — Progress Notes (Signed)
Spoke with Beverlee Nims, RN concerning pre-CABG ultrasound. At this time, patient is not agreeable to the CABG procedure and has not yet provided consent. Advised to hold off on pre-CABG study.   Darlin Coco, RDMS

## 2020-09-26 NOTE — Telephone Encounter (Signed)
Multiple attempts to contact patient with concerns. Message left for patient to contact office as needed.

## 2020-09-26 NOTE — Progress Notes (Signed)
ANTICOAGULATION CONSULT NOTE - Follow-Up  Pharmacy Consult for Heparin Indication: chest pain/ACS, s/p cath, considering PCI vs CABG  Patient Measurements: Height: 5\' 4"  (162.6 cm) Weight: 110.4 kg (243 lb 6.4 oz) IBW/kg (Calculated) : 54.7 Heparin Dosing Weight: 80 kg  Vital Signs: Temp: 98.3 F (36.8 C) (02/16 0343) Temp Source: Oral (02/16 0343) BP: 129/92 (02/16 1152) Pulse Rate: 104 (02/16 1152)  Labs: Recent Labs    09/24/20 1720 09/25/20 0126 09/25/20 0418 09/25/20 0947 09/26/20 0253 09/26/20 1345  HGB 11.5*  --   --   --  10.3*  --   HCT 35.4*  --   --   --  31.4*  --   PLT 307  --   --   --  296  --   HEPARINUNFRC  --   --   --  0.40 0.26* 0.38  CREATININE 0.62  --   --   --   --   --   TROPONINIHS  --  1,390* 926*  --   --   --     Estimated Creatinine Clearance: 109.1 mL/min (by C-G formula based on SCr of 0.62 mg/dL).  Assessment: 8 YOF who presented on 2/14 with ACS/NSTEMI. She is now s/p cath with multivessel disease for CABG vs PCI.  -heparin level at goal on 1400 units/hr    Goal of Therapy:  Heparin level 0.3-0.7 units/ml Monitor platelets by anticoagulation protocol: Yes   Plan:  -Continue heparin at 1400 units/hr -Daily heparin level and CBC  Hildred Laser, PharmD Clinical Pharmacist **Pharmacist phone directory can now be found on [email or web address] ([email or web address]).  Listed under [place].

## 2020-09-26 NOTE — Progress Notes (Signed)
Patient expresses frustration and is anxious over CABG procedure and wanted to leave home immediately. RN asked patient to express feelings and patient explained how she feels pressured and feels as if she is not making the decision herself and feels that she has to accept this without talking to family and without given time to think about the procedure.RN reassured the patient on what the care plan is currently, explained the general CABG procedure and patient verbalizes understanding. Spoke to [place], NP over patient's concern and cardiology is aware. Patient would like more information and education over procedure.

## 2020-09-26 NOTE — Progress Notes (Signed)
ANTICOAGULATION CONSULT NOTE - Follow-Up  Pharmacy Consult for Heparin Indication: chest pain/ACS, s/p cath, considering PCI vs CABG  Patient Measurements: Height: 5\' 4"  (162.6 cm) Weight: 110.4 kg (243 lb 6.4 oz) IBW/kg (Calculated) : 54.7 Heparin Dosing Weight: 80 kg  Vital Signs: Temp: 98.3 F (36.8 C) (02/16 0343) Temp Source: Oral (02/16 0343) BP: 118/83 (02/16 0343) Pulse Rate: 88 (02/16 0343)  Labs: Recent Labs    09/24/20 1720 09/25/20 0126 09/25/20 0418 09/25/20 0947 09/26/20 0253  HGB 11.5*  --   --   --  10.3*  HCT 35.4*  --   --   --  31.4*  PLT 307  --   --   --  296  HEPARINUNFRC  --   --   --  0.40 0.26*  CREATININE 0.62  --   --   --   --   TROPONINIHS  --  1,390* 926*  --   --     Estimated Creatinine Clearance: 109.1 mL/min (by C-G formula based on SCr of 0.62 mg/dL).  Assessment: 62 YOF who presented on 2/14 with ACS/NSTEMI. She is now s/p cath with multivessel disease. Pharmacy consulted to dose heparin (sheath removed ~ 11:30am)  2/16 AM update:  Heparin level low after re-start s/p cath  Goal of Therapy:  Heparin level 0.3-0.7 units/ml Monitor platelets by anticoagulation protocol: Yes   Plan:  Inc heparin to 1400 units/hr 1400 heparin level  Narda Bonds, PharmD, BCPS Clinical Pharmacist Phone: [phone]

## 2020-09-27 ENCOUNTER — Other Ambulatory Visit: Payer: Self-pay | Admitting: Cardiology

## 2020-09-27 LAB — CBC
HCT: 34.8 % — ABNORMAL LOW (ref 36.0–46.0)
Hemoglobin: 10.8 g/dL — ABNORMAL LOW (ref 12.0–15.0)
MCH: 26.7 pg (ref 26.0–34.0)
MCHC: 31 g/dL (ref 30.0–36.0)
MCV: 86.1 fL (ref 80.0–100.0)
Platelets: 326 10*3/uL (ref 150–400)
RBC: 4.04 MIL/uL (ref 3.87–5.11)
RDW: 13.2 % (ref 11.5–15.5)
WBC: 7.8 10*3/uL (ref 4.0–10.5)
nRBC: 0 % (ref 0.0–0.2)

## 2020-09-27 LAB — GLUCOSE, CAPILLARY
Glucose-Capillary: 153 mg/dL — ABNORMAL HIGH (ref 70–99)
Glucose-Capillary: 187 mg/dL — ABNORMAL HIGH (ref 70–99)

## 2020-09-27 LAB — HEPARIN LEVEL (UNFRACTIONATED): Heparin Unfractionated: 0.46 IU/mL (ref 0.30–0.70)

## 2020-09-27 LAB — HEMOGLOBIN A1C
Hgb A1c MFr Bld: 7.8 % — ABNORMAL HIGH (ref 4.8–5.6)
Mean Plasma Glucose: 177 mg/dL

## 2020-09-27 LAB — BASIC METABOLIC PANEL
Anion gap: 11 (ref 5–15)
BUN: 20 mg/dL (ref 6–20)
CO2: 23 mmol/L (ref 22–32)
Calcium: 8.6 mg/dL — ABNORMAL LOW (ref 8.9–10.3)
Chloride: 101 mmol/L (ref 98–111)
Creatinine, Ser: 0.8 mg/dL (ref 0.44–1.00)
GFR, Estimated: >60 mL/min (ref >60)
Glucose, Bld: 122 mg/dL — ABNORMAL HIGH (ref 70–99)
Potassium: 3.6 mmol/L (ref 3.5–5.1)
Sodium: 135 mmol/L (ref 135–145)

## 2020-09-27 LAB — ECHOCARDIOGRAM COMPLETE
Area-P 1/2: 6.17 cm2
Calc EF: 39.4 %
Height: 64 in
S' Lateral: 4 cm
Single Plane A2C EF: 36.9 %
Single Plane A4C EF: 42 %
Weight: 3894.4 oz

## 2020-09-27 SURGERY — CORONARY ARTERY BYPASS GRAFTING (CABG)
Anesthesia: General | Site: Chest

## 2020-09-27 MED ORDER — METOPROLOL SUCCINATE ER 50 MG PO TB24: 50.0000 mg | ORAL_TABLET | Freq: Every day | ORAL | 1 refills | Status: DC

## 2020-09-27 MED ORDER — METOPROLOL SUCCINATE ER 50 MG PO TB24: 50.0000 mg | ORAL_TABLET | Freq: Every day | ORAL | Status: DC

## 2020-09-27 MED ORDER — ATORVASTATIN CALCIUM 80 MG PO TABS: 80.0000 mg | ORAL_TABLET | Freq: Every day | ORAL | 1 refills | Status: DC

## 2020-09-27 MED ORDER — ATORVASTATIN CALCIUM 80 MG PO TABS: 80.0000 mg | ORAL_TABLET | Freq: Every day | ORAL | 0 refills | Status: DC

## 2020-09-27 MED ORDER — NITROGLYCERIN 0.4 MG SL SUBL: 0.4000 mg | SUBLINGUAL_TABLET | SUBLINGUAL | 0 refills | Status: DC | PRN

## 2020-09-27 MED ORDER — METOPROLOL TARTRATE 50 MG PO TABS: 50.0000 mg | ORAL_TABLET | Freq: Every day | ORAL | 0 refills | Status: DC

## 2020-09-27 MED ORDER — NITROGLYCERIN 0.4 MG SL SUBL: 0.4000 mg | SUBLINGUAL_TABLET | SUBLINGUAL | 1 refills | Status: DC | PRN

## 2020-09-27 MED FILL — NITROGLYCERIN 0.4 MG TAB SL: 0.4 | 7 days supply | Qty: 25 | Fill #0

## 2020-09-27 MED FILL — ATORVASTATIN CALCIUM 80 MG: 80 | 30 days supply | Qty: 30 | Fill #0

## 2020-09-27 MED FILL — METOPROLOL TARTRATE 50 MG T: 50 | 30 days supply | Qty: 30 | Fill #0

## 2020-09-27 NOTE — Progress Notes (Signed)
Nsg Discharge Note  Admit Date:  09/24/2020 Discharge date: 09/27/2020   [name] to be D/C'd  per MD order. Patient/caregiver able to verbalize understanding.  Discharge Medication: Allergies as of 09/27/2020      Reactions   Penicillins Rash   Historical Has patient had a PCN reaction causing immediate rash, facial/tongue/throat swelling, SOB or lightheadedness with hypotension: No Has patient had a PCN reaction causing severe rash involving mucus membranes or skin necrosis: No Has patient had a PCN reaction that required hospitalization: No Has patient had a PCN reaction occurring within the last 10 years: No If all of the above answers are "NO", then may proceed with Cephalosporin use.      Medication List    STOP taking these medications   amLODipine 10 MG tablet Commonly known as: NORVASC   hydrochlorothiazide 25 MG tablet Commonly known as: HYDRODIURIL     TAKE these medications   albuterol 108 (90 Base) MCG/ACT inhaler Commonly known as: Proventil HFA Inhale 2 puffs into the lungs every 6 (six) hours as needed for wheezing.   aspirin EC 81 MG tablet Take 81 mg by mouth daily.   atorvastatin 80 MG tablet Commonly known as: Lipitor Take 1 tablet (80 mg total) by mouth daily. What changed:   medication strength  how much to take   cyclobenzaprine 10 MG tablet Commonly known as: FLEXERIL Take 1 tablet (10 mg total) by mouth 2 (two) times daily as needed for muscle spasms.   gabapentin 100 MG capsule Commonly known as: NEURONTIN Take 1 capsule (100 mg total) by mouth 3 (three) times daily.   glipiZIDE 10 MG tablet Commonly known as: GLUCOTROL TAKE 1 TABLET BY MOUTH TWICE DAILY BEFORE A MEAL What changed:   how much to take  how to take this  when to take this  additional instructions   Insulin Pen Needle 29G X 5MM Misc Use with insulin   Lantus SoloStar 100 UNIT/ML Solostar Pen Generic drug: insulin glargine Inject 35 Units into the  skin daily.   lisinopril 10 MG tablet Commonly known as: ZESTRIL Take 1 tablet (10 mg total) by mouth daily.   metoprolol tartrate 50 MG tablet Commonly known as: Lopressor Take 1 tablet (50 mg total) by mouth daily.   nitroGLYCERIN 0.4 MG SL tablet Commonly known as: Nitrostat Place 1 tablet (0.4 mg total) under the tongue every 5 (five) minutes as needed.       Discharge Assessment: Vitals:   09/27/20 0828 09/27/20 1242  BP: 134/83 125/75  Pulse: 96 94  Resp: 20 16  Temp:    SpO2: 100% 99%   Skin clean, dry and intact without evidence of skin break down, no evidence of skin tears noted. IV catheter discontinued intact. Site without signs and symptoms of complications - no redness or edema noted at insertion site, patient denies c/o pain - only slight tenderness at site.  Dressing with slight pressure applied.  D/c Instructions-Education: Discharge instructions given to patient/family with verbalized understanding. D/c education completed with patient/family including follow up instructions, medication list, d/c activities limitations if indicated, with other d/c instructions as indicated by MD - patient able to verbalize understanding, all questions fully answered. Patient instructed to return to ED, call 911, or call MD for any changes in condition.  Patient escorted via [facility], and D/C home via private auto [place] RN 09/27/2020 3:05 PM

## 2020-09-27 NOTE — Progress Notes (Signed)
Progress Note  Patient Name: Tiffany Le Date of Encounter: 09/27/2020  [facility] Cardiologist: Quay Burow, MD   Subjective   No chest pain this morning. Seen by TCTS yesterday with recommendations for CABG. Ms. Nader has decided that she does not want any intervention and wishes to be discharged today [place].  Inpatient Medications    Scheduled Meds: . aspirin EC  81 mg Oral Daily  . atorvastatin  80 mg Oral q1800  . epinephrine  0-10 mcg/min Intravenous To OR  . gabapentin  100 mg Oral TID  . heparin-papaverine-plasmalyte irrigation   Irrigation To OR  . insulin aspart  0-15 Units Subcutaneous TID WC  . insulin glargine  35 Units Subcutaneous Daily  . insulin   Intravenous To OR  . lisinopril  10 mg Oral Daily  . magnesium sulfate  40 mEq Other To OR  . metoprolol succinate  50 mg Oral Daily  . phenylephrine  30-200 mcg/min Intravenous To OR  . potassium chloride  80 mEq Other To OR  . sodium chloride flush  3 mL Intravenous Q12H  . tranexamic acid  15 mg/kg Intravenous To OR  . tranexamic acid  2 mg/kg Intracatheter To OR   Continuous Infusions: . sodium chloride    . dexmedetomidine    . heparin 30,000 units/NS 1000 mL solution for CELLSAVER    . heparin 1,400 Units/hr (09/27/20 0926)  . levofloxacin (LEVAQUIN) IV    . milrinone    . nitroGLYCERIN    . norepinephrine    . tranexamic acid (CYKLOKAPRON) infusion ([facility])    . vancomycin     PRN Meds: sodium chloride, acetaminophen, nitroGLYCERIN, ondansetron (ZOFRAN) IV, sodium chloride flush   Vital Signs    Vitals:   09/26/20 2057 09/27/20 0341 09/27/20 0344 09/27/20 0828  BP: 125/76 128/77  134/83  Pulse: (!) 102 98 96 96  Resp: 18 18 (!) 22 20  Temp: 98.3 F (36.8 C) 98.9 F (37.2 C)    TempSrc: Oral     SpO2: 100% 97% 96% 100%  Weight:   111.1 kg   Height:        Intake/Output Summary (Last 24 hours) at 09/27/2020 1050 Last data filed at 09/27/2020 1046 Gross per 24  hour  Intake 860.94 ml  Output 500 ml  Net 360.94 ml   Last 3 Weights 09/27/2020 09/26/2020 09/25/2020  Weight (lbs) 244 lb 14.4 oz 243 lb 6.4 oz 240 lb 4.8 oz  Weight (kg) 111.086 kg 110.406 kg 109 kg      Telemetry    ST rates 100s - Personally Reviewed  Physical Exam  Pleasant younger AAF, sitting up in the chair GEN: No acute distress.   Neck: No JVD Cardiac: RRR, no murmurs, rubs, or gallops.  Respiratory: Clear to auscultation bilaterally. GI: Soft, nontender, non-distended  MS: No edema; No deformity. Right radial cath site stable. Neuro:  Nonfocal  Psych: Normal affect   Labs    High Sensitivity Troponin:   Recent Labs  Lab 09/25/20 0126 09/25/20 0418  TROPONINIHS 1,390* 926*      Chemistry Recent Labs  Lab 09/24/20 1720 09/27/20 0221  NA 138 135  K 3.6 3.6  CL 101 101  CO2 27 23  GLUCOSE 89 122*  BUN 14 20  CREATININE 0.62 0.80  CALCIUM 9.2 8.6*  GFRNONAA >60 >60  ANIONGAP 10 11     Hematology Recent Labs  Lab 09/24/20 1720 09/26/20 0253 09/27/20 0221  WBC  7.1 5.8 7.8  RBC 4.14 3.72* 4.04  HGB 11.5* 10.3* 10.8*  HCT 35.4* 31.4* 34.8*  MCV 85.5 84.4 86.1  MCH 27.8 27.7 26.7  MCHC 32.5 32.8 31.0  RDW 13.4 13.4 13.2  PLT 307 296 326    BNPNo results for input(s): BNP, PROBNP in the last 168 hours.   DDimer No results for input(s): DDIMER in the last 168 hours.   Radiology    CARDIAC CATHETERIZATION  Result Date: 09/26/2020  Prox LAD to Mid LAD lesion is 95% stenosed. Mid LAD is 70% stenosed  Dist LAD lesion is 50% stenosed.  Ramus-1 lesion is 95% stenosed with 70% stenosed side branch in Lat Ramus. Beyond the bifurcation, Ramus-2 lesion is 60% stenosis  Prox Cx to Dist Cx lesion is 65% stenosed with 70% stenosed side branch in 1st Mrg.  LPAV lesion is 70% stenosed.  Prox RCA-1 lesion is 60% stenosed.  Prox RCA-2 lesion is 85% stenosed.  There is mild to moderate left ventricular systolic dysfunction. EF estimated 40 to 19%  LV  end diastolic pressure is severely elevated. 33 mmHg  There is no aortic valve stenosis.  SUMMARY  Severe multivessel CAD: Early mid LAD 95% followed by 70%, bifurcation high OM1 95%, bifurcation LCx-OM2 (65-70%), small caliber codominant RCA with 85% mid stenosis  ACUTE COMBINED SYSTOLIC AND DIASTOLIC HEART FAILURE  mild to moderate reduced EF of roughly 40 to 45% with apical akinesis as well as anterior wall hypokinesis.  severely elevated LVEDP of 33 mmHg RECOMMENDATIONS  Restart IV heparin 8 hours after sheath removal.  CVTS consultation given diabetes and multivessel CAD.  PCI options would be LAD stenting of both lesions and then stenting of the high OM/ramus across the sidebranch with provisional PTCA; would treat the LCx and midRCA medically Glenetta Hew, MD   ECHOCARDIOGRAM COMPLETE  Result Date: 09/27/2020    ECHOCARDIOGRAM REPORT   Patient Name:   Tiffany Le Date of Exam: 09/26/2020 Medical Rec #:  [id number]        Height:       64.0 in Accession #:    [id number]       Weight:       243.4 lb Date of Birth:  29-Dec-1975        BSA:          2.127 m Patient Age:    45 years         BP:           129/92 mmHg Patient Gender: F                HR:           89 bpm. Exam Location:  Inpatient Procedure: 2D Echo Indications:    NSTEMI  History:        Patient has no prior history of Echocardiogram examinations.                 Risk Factors:Diabetes and Hypertension.  Sonographer:    Johny Chess Referring Phys: [place]  1. Left ventricular ejection fraction, by estimation, is 35 to 40%. The left ventricle has moderately decreased function. The left ventricle demonstrates regional wall motion abnormalities (see scoring diagram/findings for description). The left ventricular internal cavity size was mildly dilated. There is mild left ventricular hypertrophy. Left ventricular diastolic parameters are indeterminate.  2. Right ventricular systolic function is normal.  The right ventricular size is normal. There is mildly elevated pulmonary artery systolic  pressure. The estimated right ventricular systolic pressure is 87.5 mmHg.  3. The mitral valve is normal in structure. Trivial mitral valve regurgitation.  4. The aortic valve is tricuspid. Aortic valve regurgitation is not visualized. Mild aortic valve sclerosis is present, with no evidence of aortic valve stenosis.  5. The inferior vena cava is normal in size with <50% respiratory variability, suggesting right atrial pressure of 8 mmHg. FINDINGS  Left Ventricle: Left ventricular ejection fraction, by estimation, is 35 to 40%. The left ventricle has moderately decreased function. The left ventricle demonstrates regional wall motion abnormalities. The left ventricular internal cavity size was mildly dilated. There is mild left ventricular hypertrophy. Left ventricular diastolic parameters are indeterminate.  LV Wall Scoring: The mid and distal anterior wall, entire anterior septum, mid inferoseptal segment, apical inferior segment, and apex are hypokinetic. The entire lateral wall, inferior wall, basal anterior segment, and basal inferoseptal segment are normal. Right Ventricle: The right ventricular size is normal. No increase in right ventricular wall thickness. Right ventricular systolic function is normal. There is mildly elevated pulmonary artery systolic pressure. The tricuspid regurgitant velocity is 3.00  m/s, and with an assumed right atrial pressure of 8 mmHg, the estimated right ventricular systolic pressure is 64.3 mmHg. Left Atrium: Left atrial size was normal in size. Right Atrium: Right atrial size was normal in size. Pericardium: There is no evidence of pericardial effusion. Mitral Valve: The mitral valve is normal in structure. Trivial mitral valve regurgitation. Tricuspid Valve: The tricuspid valve is normal in structure. Tricuspid valve regurgitation is trivial. Aortic Valve: The aortic valve is tricuspid.  Aortic valve regurgitation is not visualized. Mild aortic valve sclerosis is present, with no evidence of aortic valve stenosis. Pulmonic Valve: The pulmonic valve was not well visualized. Pulmonic valve regurgitation is not visualized. Aorta: The aortic root and ascending aorta are structurally normal, with no evidence of dilitation. Venous: The inferior vena cava is normal in size with less than 50% respiratory variability, suggesting right atrial pressure of 8 mmHg. IAS/Shunts: No atrial level shunt detected by color flow Doppler.  LEFT VENTRICLE PLAX 2D LVIDd:         5.10 cm      Diastology LVIDs:         4.00 cm      LV e' medial:    8.59 cm/s LV PW:         1.00 cm      LV E/e' medial:  11.8 LV IVS:        1.10 cm      LV e' lateral:   6.31 cm/s LVOT diam:     1.90 cm      LV E/e' lateral: 16.0 LV SV:         43 LV SV Index:   20 LVOT Area:     2.84 cm  LV Volumes (MOD) LV vol d, MOD A2C: 124.0 ml LV vol d, MOD A4C: 106.0 ml LV vol s, MOD A2C: 78.3 ml LV vol s, MOD A4C: 61.5 ml LV SV MOD A2C:     45.7 ml LV SV MOD A4C:     106.0 ml LV SV MOD BP:      46.5 ml RIGHT VENTRICLE             IVC RV S prime:     12.40 cm/s  IVC diam: 1.90 cm TAPSE (M-mode): 1.9 cm LEFT ATRIUM             Index  RIGHT ATRIUM           Index LA diam:        4.20 cm 1.97 cm/m  RA Area:     12.20 cm LA Vol (A2C):   77.8 ml 36.58 ml/m RA Volume:   27.80 ml  13.07 ml/m LA Vol (A4C):   61.3 ml 28.82 ml/m LA Biplane Vol: 69.2 ml 32.54 ml/m  AORTIC VALVE LVOT Vmax:   77.00 cm/s LVOT Vmean:  57.300 cm/s LVOT VTI:    0.152 m  AORTA Ao Root diam: 3.10 cm Ao Asc diam:  3.20 cm MITRAL VALVE                TRICUSPID VALVE MV Area (PHT): 6.17 cm     TR Peak grad:   36.0 mmHg MV Decel Time: 123 msec     TR Vmax:        300.00 cm/s MV E velocity: 101.00 cm/s MV A velocity: 69.00 cm/s   SHUNTS MV E/A ratio:  1.46         Systemic VTI:  0.15 m                             Systemic Diam: 1.90 cm Oswaldo Milian MD Electronically signed  by Oswaldo Milian MD Signature Date/Time: 09/27/2020/1:09:43 AM    Final     Cardiac Studies   Cath: 09/25/20   Prox LAD to Mid LAD lesion is 95% stenosed. Mid LAD is 70% stenosed  Dist LAD lesion is 50% stenosed.  Ramus-1 lesion is 95% stenosed with 70% stenosed side branch in Lat Ramus. Beyond the bifurcation, Ramus-2 lesion is 60% stenosis  Prox Cx to Dist Cx lesion is 65% stenosed with 70% stenosed side branch in 1st Mrg.  LPAV lesion is 70% stenosed.  Prox RCA-1 lesion is 60% stenosed.  Prox RCA-2 lesion is 85% stenosed.  There is mild to moderate left ventricular systolic dysfunction. EF estimated 40 to 88%  LV end diastolic pressure is severely elevated. 33 mmHg  There is no aortic valve stenosis.   SUMMARY  Severe multivessel CAD: Early mid LAD 95% followed by 70%, bifurcation high OM1 95%, bifurcation LCx-OM2 (65-70%), small caliber codominant RCA with 85% mid stenosis  ACUTE COMBINED SYSTOLIC AND DIASTOLIC HEART FAILURE ? mild to moderate reduced EF of roughly 40 to 45% with apical akinesis as well as anterior wall hypokinesis. ? severely elevated LVEDP of 33 mmHg   RECOMMENDATIONS  Restart IV heparin 8 hours after sheath removal.  CVTS consultation given diabetes and multivessel CAD.  PCI options would be LAD stenting of both lesions and then stenting of the high OM/ramus across the sidebranch with provisional PTCA; would treat the LCx and midRCA medically  Glenetta Hew, MD  Diagnostic Dominance: Co-dominant    2D echocardiogram (09/26/2020)  IMPRESSIONS    1. Left ventricular ejection fraction, by estimation, is 35 to 40%. The  left ventricle has moderately decreased function. The left ventricle  demonstrates regional wall motion abnormalities (see scoring  diagram/findings for description). The left  ventricular internal cavity size was mildly dilated. There is mild left  ventricular hypertrophy. Left ventricular diastolic parameters  are  indeterminate.  2. Right ventricular systolic function is normal. The right ventricular  size is normal. There is mildly elevated pulmonary artery systolic  pressure. The estimated right ventricular systolic pressure is 41.6 mmHg.  3. The mitral valve is normal in structure. Trivial mitral  valve  regurgitation.  4. The aortic valve is tricuspid. Aortic valve regurgitation is not  visualized. Mild aortic valve sclerosis is present, with no evidence of  aortic valve stenosis.  5. The inferior vena cava is normal in size with <50% respiratory  variability, suggesting right atrial pressure of 8 mmHg.    Patient Profile     45 y.o. female with HTN, HLD, DM who presents with shortness of breath found to have an NSTEMI  Assessment & Plan    1. NSTEMI: hsTn 1390, now down trending at 925. Underwent cardiac cath noted above with severe multivessel CAD, mLAD 95%, 70% bifurcation high OM1 95%, bifurcation Lcx-OM2 65-70% and small caliber codominant RCA with 85% stenosis. Recommendations for TCTS consult. Seen by Dr. Orvan Seen who felt that she was a suitable CABG candidate.  The patient has decided that she does not want any therapy and wishes to be discharged Uncertain.  I did offer her high risk multivessel PCI as well which she declined.  She said no further symptoms during her hospitalization and specifically denies chest pain -- ASA, statin, BB  2. HTN: stable with current regimen -- HR is elevated, will increase metoprolol to 25mg  daily, continue lisinopril 10mg  daily   3. DM: Hgb A1c 8.3 (7/21), pending this admission -- holding home oral meds -- SSI while in patient  4. HLD: LDL 91 -- increased atorvastatin 80mg  daily  5. Lymphadenopathy: mediastinal, bilateral hilar, bilateral axillary lymphadenopathy. Concerning for lymphoproliferative disorder/  -- likely outpatient evaluation    6. ICM: EF noted at 40-45% on LV gram. EDP elevated on cath with LE edema --  will give IV lasix 40mg  x1 -- echo pending -- on BB and ACEI  For questions or updates, please contact [facility] Please consult [email or web address] for contact info under        Lorretta Harp, M.D., [place], [facility], [place], [place] [address] [place], [place]  [address]  [phone] 09/27/2020 10:54 AM

## 2020-09-27 NOTE — Plan of Care (Signed)
Patient discharged to go home,self care 

## 2020-09-27 NOTE — Progress Notes (Signed)
86 Pt not appropriate  for CRP 2 referral  as she needs high risk PCI or CABG. Pt leaving AMA.  Can discuss if pt returns for treatment. Graylon Good RN BSN 09/27/2020 2:24 PM

## 2020-09-27 NOTE — Progress Notes (Addendum)
Heart Failure Navigator Progress Note Unable to complete Navigator interview and [facility] d/t quick DC/AMA.  Placed HV TOC appt Tuesday 2/22 @ 2pm.  Pt will see HF provider, pharmacist, social work and Marine scientist.   TOC f/u -no insurance -medication cost/regimen -medication optimization  Pricilla Holm, RN, BSN Heart Failure Nurse Navigator [phone]

## 2020-09-27 NOTE — Discharge Summary (Addendum)
Discharge Summary    Patient ID: Tiffany Le MRN: [id number]; DOB: 09/18/1975  Admit date: 09/24/2020 Discharge date: 09/27/2020  PCP:  Azzie Glatter, [place]  Cardiologist:  Quay Burow, MD  Advanced Practice Provider:  No care team member to display Electrophysiologist:  None   Discharge Diagnoses    Principal Problem:   NSTEMI (non-ST elevated myocardial infarction) [facility]) Active Problems:   Essential hypertension, benign   Uncontrolled diabetes mellitus with complication, without long-term current use of insulin [facility])  Diagnostic Studies/Procedures    Cath: 09/25/20   Prox LAD to Mid LAD lesion is 95% stenosed. Mid LAD is 70% stenosed Dist LAD lesion is 50% stenosed. Ramus-1 lesion is 95% stenosed with 70% stenosed side branch in Lat Ramus. Beyond the bifurcation, Ramus-2 lesion is 60% stenosis Prox Cx to Dist Cx lesion is 65% stenosed with 70% stenosed side branch in 1st Mrg. LPAV lesion is 70% stenosed. Prox RCA-1 lesion is 60% stenosed. Prox RCA-2 lesion is 85% stenosed. There is mild to moderate left ventricular systolic dysfunction. EF estimated 40 to 54% LV end diastolic pressure is severely elevated. 33 mmHg There is no aortic valve stenosis.   SUMMARY Severe multivessel CAD: Early mid LAD 95% followed by 70%, bifurcation high OM1 95%, bifurcation LCx-OM2 (65-70%), small caliber codominant RCA with 85% mid stenosis ACUTE COMBINED SYSTOLIC AND DIASTOLIC HEART FAILURE mild to moderate reduced EF of roughly 40 to 45% with apical akinesis as well as anterior wall hypokinesis. severely elevated LVEDP of 33 mmHg     RECOMMENDATIONS Restart IV heparin 8 hours after sheath removal. CVTS consultation given diabetes and multivessel CAD. PCI options would be LAD stenting of both lesions and then stenting of the high OM/ramus across the sidebranch with provisional PTCA; would treat the LCx and midRCA medically   Glenetta Hew, MD   Diagnostic Dominance: Co-dominant     Echo: 09/26/20  IMPRESSIONS     1. Left ventricular ejection fraction, by estimation, is 35 to 40%. The  left ventricle has moderately decreased function. The left ventricle  demonstrates regional wall motion abnormalities (see scoring  diagram/findings for description). The left  ventricular internal cavity size was mildly dilated. There is mild left  ventricular hypertrophy. Left ventricular diastolic parameters are  indeterminate.   2. Right ventricular systolic function is normal. The right ventricular  size is normal. There is mildly elevated pulmonary artery systolic  pressure. The estimated right ventricular systolic pressure is 62.7 mmHg.   3. The mitral valve is normal in structure. Trivial mitral valve  regurgitation.   4. The aortic valve is tricuspid. Aortic valve regurgitation is not  visualized. Mild aortic valve sclerosis is present, with no evidence of  aortic valve stenosis.   5. The inferior vena cava is normal in size with <50% respiratory  variability, suggesting right atrial pressure of 8 mmHg.  _____________   History of Present Illness     Tiffany Le is a 45 y.o. female with history of HTN, HLD and DM who presents with two weeks of progressive shortness of breath with exertion. She states that any amount of exertion makes her short of breath. She has been unable to lie flat due to difficulty breathing and is sleeping propped up in a seated position. She has also noticed worsening swelling in her legs that does not resolve at night. She has been compliant with her HCTZ therapy which had previously kept her swelling at [place]  but has now increased in the past two weeks. She denies any chest pain, lightheadedness, dizziness or palpitations. She has had a number of family members and friends who have been infected with COVID. She is not vaccinated. She denies any tobacco, alcohol or drug use. She has a strong  family history of coronary disease (mother had a 4 vessel bypass, father died of MI).    Hospital Course     Consultants: TCTS   1. NSTEMI: hsTn rose to 1390 then trending back down. Underwent cardiac cath noted above with severe multivessel CAD, mLAD 95%, 70% bifurcation high OM1 95%, bifurcation Lcx-OM2 65-70% and small caliber codominant RCA with 85% stenosis. Recommendations for TCTS consult. Seen by Dr. Orvan Seen but patient refused to have CABG while inpatient. Was also offered PCI by  Dr. Gwenlyn Found she refused that as well. She wanted to left AGAINST MEDICAL ADVICE. -- will be continued on ASA, statin, BB -- she was not discharged on plavix after discussion with Dr. Gwenlyn Found with the idea that if she decided to pursue CABG this would impede scheduling   2. HTN: stable with current regimen -- tolerated the addition of metoprolol to 25mg  daily -- continue lisinopril 10mg  daily, holding amlodipine and HCTZ at discharge   3. DM: Hgb A1c 8.3 (7/21), pending this admission -- SSI while in patient, but transitioned back to home regiment at discharge    4. HLD: LDL 91 -- increased atorvastatin 80mg  daily -- will FLP/LFTs in 8 weeks   5. Lymphadenopathy: mediastinal, bilateral hilar, bilateral axillary lymphadenopathy. Concerning for lymphoproliferative disorder/  -- likely outpatient evaluation  -- instructed to follow up with her PCP    6. ICM: EF noted at 40-45% on LV gram. EDP elevated on cath with LE edema. Was given IV lasix with improvement.  -- echo showed an EF of 35-40% with m/distal anterior, inferoseptal and apex being hypokinetic -- started on BB and ACEI  The patient was independently evaluated by Dr. Gwenlyn Found and determined stable for discharge (AMA). Medications were sent to her pharmacy and follow up has been arranged in the office.   Did the patient have an acute coronary syndrome (MI, NSTEMI, STEMI, etc) this admission?:  Yes                               AHA/ACC Clinical  Performance & Quality Measures: Aspirin prescribed? - Yes ADP Receptor Inhibitor (Plavix/Clopidogrel, Brilinta/Ticagrelor or Effient/Prasugrel) prescribed (includes medically managed patients)? - No - see above discussion Beta Blocker prescribed? - Yes High Intensity Statin (Lipitor 40-80mg  or Crestor 20-40mg ) prescribed? - Yes EF assessed during THIS hospitalization? - Yes For EF <40%, was ACEI/ARB prescribed? - Yes For EF <40%, Aldosterone Antagonist (Spironolactone or Eplerenone) prescribed? - No - Reason:  consider at outpatient follow up Cardiac Rehab Phase II ordered (including medically managed patients)? - Yes       _____________  Discharge Vitals Blood pressure 125/75, pulse 94, temperature 98.9 F (37.2 C), resp. rate 16, height 5\' 4"  (1.626 m), weight 111.1 kg, SpO2 99 %.  Filed Weights   09/25/20 1330 09/26/20 0343 09/27/20 0344  Weight: 109 kg 110.4 kg 111.1 kg    Labs & Radiologic Studies    CBC Recent Labs    09/26/20 0253 09/27/20 0221  WBC 5.8 7.8  HGB 10.3* 10.8*  HCT 31.4* 34.8*  MCV 84.4 86.1  PLT 296 976   Basic Metabolic Panel Recent Labs  09/24/20 1720 09/27/20 0221  NA 138 135  K 3.6 3.6  CL 101 101  CO2 27 23  GLUCOSE 89 122*  BUN 14 20  CREATININE 0.62 0.80  CALCIUM 9.2 8.6*   Liver Function Tests No results for input(s): AST, ALT, ALKPHOS, BILITOT, PROT, ALBUMIN in the last 72 hours. No results for input(s): LIPASE, AMYLASE in the last 72 hours. High Sensitivity Troponin:   Recent Labs  Lab 09/25/20 0126 09/25/20 0418  TROPONINIHS 1,390* 926*    BNP Invalid input(s): POCBNP D-Dimer No results for input(s): DDIMER in the last 72 hours. Hemoglobin A1C Recent Labs    09/26/20 0253  HGBA1C 7.8*   Fasting Lipid Panel Recent Labs    09/25/20 1418  CHOL 131  HDL 30*  LDLCALC 91  TRIG 49  CHOLHDL 4.4   Thyroid Function Tests No results for input(s): TSH, T4TOTAL, T3FREE, THYROIDAB in the last 72 hours.  Invalid  input(s): FREET3 _____________  DG Chest 2 View  Result Date: 09/24/2020 CLINICAL DATA:  Shortness of breath for 2 weeks, progressive worsening EXAM: CHEST - 2 VIEW COMPARISON:  12/02/2019 FINDINGS: Frontal and lateral views of the chest demonstrate an unremarkable cardiac silhouette. Blunting of the left lateral costophrenic angle may reflect pleural thickening or trace effusion. Patchy right basilar airspace disease. No pneumothorax. No acute bony abnormalities. IMPRESSION: 1. Trace left pleural effusion versus pleural thickening. 2. Patchy right basilar airspace disease. Electronically Signed   By: Randa Ngo M.D.   On: 09/24/2020 17:57   CT Angio Chest PE W and/or Wo Contrast  Result Date: 09/25/2020 CLINICAL DATA:  Chest pain, shortness of breath, pleurisy EXAM: CT ANGIOGRAPHY CHEST WITH CONTRAST TECHNIQUE: Multidetector CT imaging of the chest was performed using the standard protocol during bolus administration of intravenous contrast. Multiplanar CT image reconstructions and MIPs were obtained to evaluate the vascular anatomy. CONTRAST:  7mL OMNIPAQUE IOHEXOL 350 MG/ML SOLN COMPARISON:  Chest x-ray 12/01/2008, chest x-ray 09/24/2020 FINDINGS: Cardiovascular: Satisfactory opacification of the pulmonary arteries to the segmental level. No evidence of pulmonary embolism. Normal heart size. No significant pericardial effusion. The thoracic aorta is normal in caliber. No atherosclerotic plaque of the thoracic aorta. No coronary artery calcifications. Mediastinum/Nodes: Bilateral hilar lymphadenopathy: 1.5 on the right (5:59) and 1.1 on the left (5: 52). Mediastinal lymphadenopathy: As an example a prevascular 1.1 cm lymph node (5:41) and a 1.1 cm precarinal lymph node (5:50). Axillary lymphadenopathy: A 1.4 cm right axillary lymph node (5:57) and a 1 cm left axial lymph node (5:44). No enlarged axillary lymph nodes. Thyroid gland, trachea, and esophagus demonstrate no significant findings.  Lungs/Pleura: Patchy ground-glass and consolidative airspace opacities within the lungs that are most prominent within the lower lobes. No pleural effusion. No pneumothorax. Upper Abdomen: No acute abnormality. Musculoskeletal: No chest wall abnormality. No acute or significant osseous findings. Review of the MIP images confirms the above findings. IMPRESSION: 1. Mediastinal, bilateral hilar, bilateral axillary lymphadenopathy. 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 2. Multifocal ground-glass airspace opacities that could represent a multifocal pneumonia (such as COVID 19 infection) versus inflammation. 3. No pulmonary embolus. Electronically Signed   By: Iven Finn M.D.   On: 09/25/2020 02:34   CARDIAC CATHETERIZATION  Result Date: 09/26/2020  Prox LAD to Mid LAD lesion is 95% stenosed. Mid LAD is 70% stenosed  Dist LAD lesion is 50% stenosed.  Ramus-1 lesion is 95% stenosed with 70% stenosed side branch in Lat Ramus. Beyond the bifurcation, Ramus-2 lesion is 60% stenosis  Prox Cx to Dist Cx lesion is 65% stenosed with 70% stenosed side branch in 1st Mrg.  LPAV lesion is 70% stenosed.  Prox RCA-1 lesion is 60% stenosed.  Prox RCA-2 lesion is 85% stenosed.  There is mild to moderate left ventricular systolic dysfunction. EF estimated 40 to 60%  LV end diastolic pressure is severely elevated. 33 mmHg  There is no aortic valve stenosis.  SUMMARY  Severe multivessel CAD: Early mid LAD 95% followed by 70%, bifurcation high OM1 95%, bifurcation LCx-OM2 (65-70%), small caliber codominant RCA with 85% mid stenosis  ACUTE COMBINED SYSTOLIC AND DIASTOLIC HEART FAILURE  mild to moderate reduced EF of roughly 40 to 45% with apical akinesis as well as anterior wall hypokinesis.  severely elevated LVEDP of 33 mmHg RECOMMENDATIONS  Restart IV heparin 8 hours after sheath removal.  CVTS consultation given diabetes and multivessel CAD.  PCI options would be LAD stenting of both lesions and  then stenting of the high OM/ramus across the sidebranch with provisional PTCA; would treat the LCx and midRCA medically Glenetta Hew, MD   ECHOCARDIOGRAM COMPLETE  Result Date: 09/27/2020    ECHOCARDIOGRAM REPORT   Patient Name:   Tiffany Le Date of Exam: 09/26/2020 Medical Rec #:  [id number]        Height:       64.0 in Accession #:    [id number]       Weight:       243.4 lb Date of Birth:  Dec 31, 1975        BSA:          2.127 m Patient Age:    21 years         BP:           129/92 mmHg Patient Gender: F                HR:           89 bpm. Exam Location:  Inpatient Procedure: 2D Echo Indications:    NSTEMI  History:        Patient has no prior history of Echocardiogram examinations.                 Risk Factors:Diabetes and Hypertension.  Sonographer:    Johny Chess Referring Phys: [place]  1. Left ventricular ejection fraction, by estimation, is 35 to 40%. The left ventricle has moderately decreased function. The left ventricle demonstrates regional wall motion abnormalities (see scoring diagram/findings for description). The left ventricular internal cavity size was mildly dilated. There is mild left ventricular hypertrophy. Left ventricular diastolic parameters are indeterminate.  2. Right ventricular systolic function is normal. The right ventricular size is normal. There is mildly elevated pulmonary artery systolic pressure. The estimated right ventricular systolic pressure is 62.3 mmHg.  3. The mitral valve is normal in structure. Trivial mitral valve regurgitation.  4. The aortic valve is tricuspid. Aortic valve regurgitation is not visualized. Mild aortic valve sclerosis is present, with no evidence of aortic valve stenosis.  5. The inferior vena cava is normal in size with <50% respiratory variability, suggesting right atrial pressure of 8 mmHg. FINDINGS  Left Ventricle: Left ventricular ejection fraction, by estimation, is 35 to 40%. The left ventricle has  moderately decreased function. The left ventricle demonstrates regional wall motion abnormalities. The left ventricular internal cavity size was mildly dilated. There is mild left ventricular hypertrophy. Left ventricular diastolic parameters are indeterminate.  LV Wall Scoring: The mid and  distal anterior wall, entire anterior septum, mid inferoseptal segment, apical inferior segment, and apex are hypokinetic. The entire lateral wall, inferior wall, basal anterior segment, and basal inferoseptal segment are normal. Right Ventricle: The right ventricular size is normal. No increase in right ventricular wall thickness. Right ventricular systolic function is normal. There is mildly elevated pulmonary artery systolic pressure. The tricuspid regurgitant velocity is 3.00  m/s, and with an assumed right atrial pressure of 8 mmHg, the estimated right ventricular systolic pressure is 02.5 mmHg. Left Atrium: Left atrial size was normal in size. Right Atrium: Right atrial size was normal in size. Pericardium: There is no evidence of pericardial effusion. Mitral Valve: The mitral valve is normal in structure. Trivial mitral valve regurgitation. Tricuspid Valve: The tricuspid valve is normal in structure. Tricuspid valve regurgitation is trivial. Aortic Valve: The aortic valve is tricuspid. Aortic valve regurgitation is not visualized. Mild aortic valve sclerosis is present, with no evidence of aortic valve stenosis. Pulmonic Valve: The pulmonic valve was not well visualized. Pulmonic valve regurgitation is not visualized. Aorta: The aortic root and ascending aorta are structurally normal, with no evidence of dilitation. Venous: The inferior vena cava is normal in size with less than 50% respiratory variability, suggesting right atrial pressure of 8 mmHg. IAS/Shunts: No atrial level shunt detected by color flow Doppler.  LEFT VENTRICLE PLAX 2D LVIDd:         5.10 cm      Diastology LVIDs:         4.00 cm      LV e' medial:     8.59 cm/s LV PW:         1.00 cm      LV E/e' medial:  11.8 LV IVS:        1.10 cm      LV e' lateral:   6.31 cm/s LVOT diam:     1.90 cm      LV E/e' lateral: 16.0 LV SV:         43 LV SV Index:   20 LVOT Area:     2.84 cm  LV Volumes (MOD) LV vol d, MOD A2C: 124.0 ml LV vol d, MOD A4C: 106.0 ml LV vol s, MOD A2C: 78.3 ml LV vol s, MOD A4C: 61.5 ml LV SV MOD A2C:     45.7 ml LV SV MOD A4C:     106.0 ml LV SV MOD BP:      46.5 ml RIGHT VENTRICLE             IVC RV S prime:     12.40 cm/s  IVC diam: 1.90 cm TAPSE (M-mode): 1.9 cm LEFT ATRIUM             Index       RIGHT ATRIUM           Index LA diam:        4.20 cm 1.97 cm/m  RA Area:     12.20 cm LA Vol (A2C):   77.8 ml 36.58 ml/m RA Volume:   27.80 ml  13.07 ml/m LA Vol (A4C):   61.3 ml 28.82 ml/m LA Biplane Vol: 69.2 ml 32.54 ml/m  AORTIC VALVE LVOT Vmax:   77.00 cm/s LVOT Vmean:  57.300 cm/s LVOT VTI:    0.152 m  AORTA Ao Root diam: 3.10 cm Ao Asc diam:  3.20 cm MITRAL VALVE                TRICUSPID  VALVE MV Area (PHT): 6.17 cm     TR Peak grad:   36.0 mmHg MV Decel Time: 123 msec     TR Vmax:        300.00 cm/s MV E velocity: 101.00 cm/s MV A velocity: 69.00 cm/s   SHUNTS MV E/A ratio:  1.46         Systemic VTI:  0.15 m                             Systemic Diam: 1.90 cm Oswaldo Milian MD Electronically signed by Oswaldo Milian MD Signature Date/Time: 09/27/2020/1:09:43 AM    Final    Disposition   Pt is being discharged home today in good condition.  Follow-up Plans & Appointments     Follow-up Information     Lorretta Harp, MD Follow up on 10/12/2020.   Specialties: Cardiology, Radiology Why: at 1:30pm for your follow up appt Contact information: [address] [place] [address] [phone]                Discharge Instructions     ([place]) Call MD:  Anytime you have any of the following symptoms: 1) 3 pound weight gain in 24 hours or 5 pounds in 1 week 2) shortness of  breath, with or without a dry hacking cough 3) swelling in the hands, feet or stomach 4) if you have to sleep on extra pillows at night in order to breathe.   Complete by: As directed    Call MD for:  difficulty breathing, headache or visual disturbances   Complete by: As directed    Call MD for:  persistant dizziness or light-headedness   Complete by: As directed    Diet - low sodium heart healthy   Complete by: As directed    Discharge instructions   Complete by: As directed    If you experience recurrent chest pain prior to your office please seek out medical attention immediately!!!!   Increase activity slowly   Complete by: As directed        Discharge Medications   Allergies as of 09/27/2020       Reactions   Penicillins Rash   Historical Has patient had a PCN reaction causing immediate rash, facial/tongue/throat swelling, SOB or lightheadedness with hypotension: No Has patient had a PCN reaction causing severe rash involving mucus membranes or skin necrosis: No Has patient had a PCN reaction that required hospitalization: No Has patient had a PCN reaction occurring within the last 10 years: No If all of the above answers are "NO", then may proceed with Cephalosporin use.        Medication List     STOP taking these medications    amLODipine 10 MG tablet Commonly known as: NORVASC   hydrochlorothiazide 25 MG tablet Commonly known as: HYDRODIURIL       TAKE these medications    albuterol 108 (90 Base) MCG/ACT inhaler Commonly known as: Proventil HFA Inhale 2 puffs into the lungs every 6 (six) hours as needed for wheezing.   aspirin EC 81 MG tablet Take 81 mg by mouth daily.   atorvastatin 80 MG tablet Commonly known as: LIPITOR Take 1 tablet (80 mg total) by mouth daily at 6 PM. What changed:  medication strength how much to take when to take this   cyclobenzaprine 10 MG tablet Commonly known as: FLEXERIL Take 1 tablet (10 mg total) by  mouth 2  (two) times daily as needed for muscle spasms.   gabapentin 100 MG capsule Commonly known as: NEURONTIN Take 1 capsule (100 mg total) by mouth 3 (three) times daily.   glipiZIDE 10 MG tablet Commonly known as: GLUCOTROL TAKE 1 TABLET BY MOUTH TWICE DAILY BEFORE A MEAL What changed:  how much to take how to take this when to take this additional instructions   Insulin Pen Needle 29G X 5MM Misc Use with insulin   Lantus SoloStar 100 UNIT/ML Solostar Pen Generic drug: insulin glargine Inject 35 Units into the skin daily.   lisinopril 10 MG tablet Commonly known as: ZESTRIL Take 1 tablet (10 mg total) by mouth daily.   metoprolol succinate 50 MG 24 hr tablet Commonly known as: TOPROL-XL Take 1 tablet (50 mg total) by mouth daily. Take with or immediately following a meal.   nitroGLYCERIN 0.4 MG SL tablet Commonly known as: NITROSTAT Place 1 tablet (0.4 mg total) under the tongue every 5 (five) minutes x 3 doses as needed for chest pain.           Outstanding Labs/Studies   FLP/LFTs in 8 weeks  Duration of Discharge Encounter   Greater than 30 minutes including physician time.  Signed, Reino Bellis, NP 09/27/2020, 1:34 PM   Agree with note by Reino Bellis NP-C  Pt admitted with NSTEMI. Cath showed severe 4 VD with moderate LV dysfunction. CABG was thought to be best option by 2 interventional cardiologists and TCTS was consulted. Dr Orvan Seen agreed with CABG and had scheduled her however the pt decided that she did not want revascularization at this time and decided that she wanted to de discharged AMA . The risks of doing so were explicitly explained to the pt. Follow up was made for TOC7 and then ROV with me.  Lorretta Harp, M.D., [place], [facility], [place] [address] [place], [place]  [address]  [phone] 09/27/2020 5:14 PM

## 2020-09-27 NOTE — Progress Notes (Signed)
ANTICOAGULATION CONSULT NOTE - Follow-Up  Pharmacy Consult for Heparin Indication: chest pain/ACS, s/p cath, considering PCI vs CABG  Patient Measurements: Height: 5\' 4"  (162.6 cm) Weight: 111.1 kg (244 lb 14.4 oz) IBW/kg (Calculated) : 54.7 Heparin Dosing Weight: 80 kg  Vital Signs: Temp: 98.9 F (37.2 C) (02/17 0341) BP: 134/83 (02/17 0828) Pulse Rate: 96 (02/17 0828)  Labs: Recent Labs    09/24/20 1720 09/25/20 0126 09/25/20 0418 09/25/20 0947 09/26/20 0253 09/26/20 1345 09/27/20 0221  HGB 11.5*  --   --   --  10.3*  --  10.8*  HCT 35.4*  --   --   --  31.4*  --  34.8*  PLT 307  --   --   --  296  --  326  HEPARINUNFRC  --   --   --    < > 0.26* 0.38 0.46  CREATININE 0.62  --   --   --   --   --  0.80  TROPONINIHS  --  1,390* 926*  --   --   --   --    < > = values in this interval not displayed.    Estimated Creatinine Clearance: 109.5 mL/min (by C-G formula based on SCr of 0.8 mg/dL).  Assessment: 26 YOF who presented on 2/14 with ACS/NSTEMI. She is now s/p cath with multivessel disease but does not want CABG or PCI. Patient wants to leave AMA -heparin level at goal on 1400 units/hr    Goal of Therapy:  Heparin level 0.3-0.7 units/ml Monitor platelets by anticoagulation protocol: Yes   Plan:  -Continue heparin at 1400 units/hr -Anticipate discontinuation today -Daily CBC and heparin level if remains in-patient  Hildred Laser, PharmD Clinical Pharmacist **Pharmacist phone directory can now be found on [email or web address] (PW [email or web address]).  Listed under [place].

## 2020-09-27 NOTE — Discharge Instructions (Signed)
Heart Attack A heart attack occurs when blood and oxygen supply to the heart is cut off. A heart attack causes damage to the heart that cannot be fixed. A heart attack is also called a myocardial infarction, or MI. If you think you are having a heart attack, do not wait to see if the symptoms will go away. Get medical help right away. What are the causes? This condition may be caused by:  A fatty substance (plaque) in the blood vessels (arteries). This can block the flow of blood to the heart.  A blood clot in the blood vessels that go to the heart. The blood clot blocks blood flow.  Low blood pressure.  An abnormal heartbeat.  Some diseases, such as problems in red blood cells (anemia)orproblems in breathing (respiratory failure).  Tightening (spasm) of a blood vessel that cuts off blood to the heart.  A tear in a blood vessel of the heart.  High blood pressure.   What increases the risk? The following factors may make you more likely to develop this condition:  Aging. The older you are, the higher your risk.  Having a personal or family history of chest pain, heart attack, stroke, or narrowing of the arteries in the legs, arms, head, or stomach (peripheral artery disease).  Being female.  Smoking.  Not getting regular exercise.  Being overweight or obese.  Having high blood pressure.  Having high cholesterol.  Having diabetes.  Drinking too much alcohol.  Using illegal drugs, such as cocaine or methamphetamine. What are the signs or symptoms? Symptoms of this condition include:  Chest pain. It may feel like: ? Crushing or squeezing. ? Tightness, pressure, fullness, or heaviness.  Pain in the arm, neck, jaw, back, or upper body.  Shortness of breath.  Heartburn.  Upset stomach (indigestion).  Feeling like you may vomit (nauseous).  Cold sweats.  Feeling tired.  Sudden light-headedness. How is this treated? A heart attack must be treated as soon as  possible. Treatment may include:  Medicines to: ? Break up or dissolve blood clots. ? Thin blood and help prevent blood clots. ? Treat blood pressure. ? Improve blood flow to the heart. ? Reduce pain. ? Reduce cholesterol.  Procedures to widen a blocked artery and keep it open.  Open heart surgery.  Receiving oxygen.  Making your heart strong again (cardiac rehabilitation) through exercise, education, and counseling.   Follow these instructions at home: Medicines  Take over-the-counter and prescription medicines only as told by your doctor. You may need to take medicine: ? To keep your blood from clotting too easily. ? To control blood pressure. ? To lower cholesterol. ? To control heart rhythms.  Do not take these medicines unless your doctor says it is okay: ? NSAIDs, such as ibuprofen. ? Supplements that have vitamin A, vitamin E, or both. ? Hormone replacement therapy that has estrogen with or without progestin. Lifestyle  Do not use any products that have nicotine or tobacco, such as cigarettes, e-cigarettes, and chewing tobacco. If you need help quitting, ask your doctor.  Avoid secondhand smoke.  Exercise regularly. Ask your doctor about a cardiac rehab program.  Eat heart-healthy foods. Your doctor will tell you what foods to eat.  Stay at a healthy weight.  Lower your stress level.  Do not use illegal drugs.      Alcohol use  Do not drink alcohol if: ? Your doctor tells you not to drink. ? You are pregnant, may be pregnant, or   are planning to become pregnant.  If you drink alcohol: ? Limit how much you use to:  0-1 drink a day for women.  0-2 drinks a day for men. ? Know how much alcohol is in your drink. In the [place] one drink equals one 12 oz bottle of beer (355 mL), one 5 oz glass of wine (148 mL), or one 1 oz glass of hard liquor (44 mL). General instructions  Work with your doctor to treat other problems you may have, such as diabetes or  high blood pressure.  Get screened for depression. Get treatment if needed.  Keep your vaccines up to date. Get the flu shot (influenza vaccine) every year.  Keep all follow-up visits as told by your doctor. This is important. Contact a doctor if:  You feel very sad.  You have trouble doing your daily activities. Get help right away if:  You have sudden, unexplained discomfort in your chest, arms, back, neck, jaw, or upper body.  You have shortness of breath.  You have sudden sweating or clammy skin.  You feel like you may vomit.  You vomit.  You feel tired or weak.  You get light-headed or dizzy.  You feel your heart beating fast.  You feel your heart skipping beats.  You have blood pressure that is higher than 180/120. These symptoms may be an emergency. Do not wait to see if the symptoms will go away. Get medical help right away. Call your local emergency services (911 in the [place] Do not drive yourself to the hospital. Summary  A heart attack occurs when blood and oxygen supply to the heart is cut off.  Do not take NSAIDs unless your doctor says it is okay.  Do not smoke. Avoid secondhand smoke.  Exercise regularly. Ask your doctor about a cardiac rehab program. This information is not intended to replace advice given to you by your health care provider. Make sure you discuss any questions you have with your health care provider. Document Revised: 11/08/2018 Document Reviewed: 11/08/2018 Elsevier Patient Education  2021 Elsevier Inc.  

## 2020-09-28 ENCOUNTER — Telehealth: Payer: Self-pay | Admitting: Licensed Clinical Social Worker

## 2020-09-28 NOTE — Telephone Encounter (Signed)
Called pt and left HIPAA compliant voicemail to connect with her to ensure she can make her upcoming appointments next week w/ [facility] and TOC clinic on 2/22.   Westley Hummer, MSW, [place]  [phone]

## 2020-10-01 ENCOUNTER — Telehealth (HOSPITAL_COMMUNITY): Payer: Self-pay

## 2020-10-01 ENCOUNTER — Telehealth: Payer: Self-pay | Admitting: Licensed Clinical Social Worker

## 2020-10-01 NOTE — Telephone Encounter (Signed)
error 

## 2020-10-01 NOTE — Telephone Encounter (Signed)
This LCSW reached out to Claudie Revering at [facility], in regards to attempt to reach pt. I let her know that I and Lilia Pro, RN w/ Heart and Vascular team have been unsuccessful in reaching her. Attempted to contact her about upcoming appointments, transportation and any additional assistance we could provide.   Claudie Revering, is aware that pt has an upcoming appointment with their team before the Heart/Vascular [facility] clinic appointment on 2/22. If able she will try and f/u with her about [facility].   I remain available moving forward as needed.   Westley Hummer, MSW, [place]  [phone]

## 2020-10-01 NOTE — Telephone Encounter (Signed)
Called to confirm [facility] TOC appt 2/22 @ 2pm, No answer. Left HIPPA appropriate vm with cb#.

## 2020-10-02 ENCOUNTER — Inpatient Hospital Stay (HOSPITAL_COMMUNITY): Payer: Medicaid Other

## 2020-10-02 ENCOUNTER — Ambulatory Visit: Payer: Self-pay | Admitting: Family Medicine

## 2020-10-02 ENCOUNTER — Encounter (HOSPITAL_COMMUNITY): Payer: Self-pay

## 2020-10-02 ENCOUNTER — Inpatient Hospital Stay: Payer: Self-pay

## 2020-10-02 ENCOUNTER — Emergency Department (HOSPITAL_COMMUNITY): Payer: Medicaid Other

## 2020-10-02 ENCOUNTER — Inpatient Hospital Stay (HOSPITAL_COMMUNITY)
Admission: EM | Admit: 2020-10-02 | Discharge: 2020-10-27 | DRG: 235 | Disposition: A | Discharge status: Home Health | Payer: Medicaid Other | Attending: Cardiothoracic Surgery | Admitting: Cardiothoracic Surgery

## 2020-10-02 DIAGNOSIS — Y832 Surgical operation with anastomosis, bypass or graft as the cause of abnormal reaction of the patient, or of later complication, without mention of misadventure at the time of the procedure: Secondary | ICD-10-CM | POA: Diagnosis not present

## 2020-10-02 DIAGNOSIS — I509 Heart failure, unspecified: Secondary | ICD-10-CM

## 2020-10-02 DIAGNOSIS — J9601 Acute respiratory failure with hypoxia: Secondary | ICD-10-CM

## 2020-10-02 DIAGNOSIS — Z8249 Family history of ischemic heart disease and other diseases of the circulatory system: Secondary | ICD-10-CM

## 2020-10-02 DIAGNOSIS — E1169 Type 2 diabetes mellitus with other specified complication: Secondary | ICD-10-CM

## 2020-10-02 DIAGNOSIS — Z833 Family history of diabetes mellitus: Secondary | ICD-10-CM

## 2020-10-02 DIAGNOSIS — Z4659 Encounter for fitting and adjustment of other gastrointestinal appliance and device: Secondary | ICD-10-CM

## 2020-10-02 DIAGNOSIS — Z83438 Family history of other disorder of lipoprotein metabolism and other lipidemia: Secondary | ICD-10-CM

## 2020-10-02 DIAGNOSIS — E1140 Type 2 diabetes mellitus with diabetic neuropathy, unspecified: Secondary | ICD-10-CM | POA: Diagnosis present

## 2020-10-02 DIAGNOSIS — N179 Acute kidney failure, unspecified: Secondary | ICD-10-CM | POA: Diagnosis not present

## 2020-10-02 DIAGNOSIS — Z20822 Contact with and (suspected) exposure to covid-19: Secondary | ICD-10-CM | POA: Diagnosis present

## 2020-10-02 DIAGNOSIS — I6329 Cerebral infarction due to unspecified occlusion or stenosis of other precerebral arteries: Secondary | ICD-10-CM | POA: Diagnosis not present

## 2020-10-02 DIAGNOSIS — I110 Hypertensive heart disease with heart failure: Secondary | ICD-10-CM | POA: Diagnosis present

## 2020-10-02 DIAGNOSIS — I97820 Postprocedural cerebrovascular infarction during cardiac surgery: Secondary | ICD-10-CM | POA: Diagnosis not present

## 2020-10-02 DIAGNOSIS — J810 Acute pulmonary edema: Secondary | ICD-10-CM

## 2020-10-02 DIAGNOSIS — Z880 Allergy status to penicillin: Secondary | ICD-10-CM

## 2020-10-02 DIAGNOSIS — Z7982 Long term (current) use of aspirin: Secondary | ICD-10-CM

## 2020-10-02 DIAGNOSIS — I6381 Other cerebral infarction due to occlusion or stenosis of small artery: Secondary | ICD-10-CM | POA: Diagnosis not present

## 2020-10-02 DIAGNOSIS — I4892 Unspecified atrial flutter: Secondary | ICD-10-CM | POA: Diagnosis present

## 2020-10-02 DIAGNOSIS — I5043 Acute on chronic combined systolic (congestive) and diastolic (congestive) heart failure: Secondary | ICD-10-CM | POA: Diagnosis present

## 2020-10-02 DIAGNOSIS — Z9889 Other specified postprocedural states: Secondary | ICD-10-CM

## 2020-10-02 DIAGNOSIS — E1165 Type 2 diabetes mellitus with hyperglycemia: Secondary | ICD-10-CM | POA: Diagnosis present

## 2020-10-02 DIAGNOSIS — D62 Acute posthemorrhagic anemia: Secondary | ICD-10-CM | POA: Diagnosis not present

## 2020-10-02 DIAGNOSIS — E785 Hyperlipidemia, unspecified: Secondary | ICD-10-CM | POA: Diagnosis present

## 2020-10-02 DIAGNOSIS — J189 Pneumonia, unspecified organism: Secondary | ICD-10-CM | POA: Diagnosis present

## 2020-10-02 DIAGNOSIS — I214 Non-ST elevation (NSTEMI) myocardial infarction: Secondary | ICD-10-CM | POA: Diagnosis present

## 2020-10-02 DIAGNOSIS — I2510 Atherosclerotic heart disease of native coronary artery without angina pectoris: Principal | ICD-10-CM | POA: Diagnosis present

## 2020-10-02 DIAGNOSIS — Z6841 Body Mass Index (BMI) 40.0 and over, adult: Secondary | ICD-10-CM

## 2020-10-02 DIAGNOSIS — I472 Ventricular tachycardia: Secondary | ICD-10-CM | POA: Diagnosis present

## 2020-10-02 DIAGNOSIS — J9811 Atelectasis: Secondary | ICD-10-CM | POA: Diagnosis present

## 2020-10-02 DIAGNOSIS — E871 Hypo-osmolality and hyponatremia: Secondary | ICD-10-CM | POA: Diagnosis not present

## 2020-10-02 DIAGNOSIS — R34 Anuria and oliguria: Secondary | ICD-10-CM | POA: Diagnosis not present

## 2020-10-02 DIAGNOSIS — E11649 Type 2 diabetes mellitus with hypoglycemia without coma: Secondary | ICD-10-CM | POA: Diagnosis not present

## 2020-10-02 DIAGNOSIS — R7309 Other abnormal glucose: Secondary | ICD-10-CM

## 2020-10-02 DIAGNOSIS — I255 Ischemic cardiomyopathy: Secondary | ICD-10-CM | POA: Diagnosis present

## 2020-10-02 DIAGNOSIS — D509 Iron deficiency anemia, unspecified: Secondary | ICD-10-CM | POA: Diagnosis present

## 2020-10-02 DIAGNOSIS — J811 Chronic pulmonary edema: Secondary | ICD-10-CM | POA: Diagnosis present

## 2020-10-02 DIAGNOSIS — J90 Pleural effusion, not elsewhere classified: Secondary | ICD-10-CM

## 2020-10-02 DIAGNOSIS — R0902 Hypoxemia: Secondary | ICD-10-CM

## 2020-10-02 DIAGNOSIS — I5021 Acute systolic (congestive) heart failure: Secondary | ICD-10-CM | POA: Diagnosis not present

## 2020-10-02 DIAGNOSIS — F419 Anxiety disorder, unspecified: Secondary | ICD-10-CM | POA: Diagnosis present

## 2020-10-02 DIAGNOSIS — Z79899 Other long term (current) drug therapy: Secondary | ICD-10-CM

## 2020-10-02 DIAGNOSIS — R570 Cardiogenic shock: Secondary | ICD-10-CM | POA: Diagnosis not present

## 2020-10-02 DIAGNOSIS — I248 Other forms of acute ischemic heart disease: Secondary | ICD-10-CM | POA: Diagnosis not present

## 2020-10-02 DIAGNOSIS — M109 Gout, unspecified: Secondary | ICD-10-CM | POA: Diagnosis present

## 2020-10-02 DIAGNOSIS — Z794 Long term (current) use of insulin: Secondary | ICD-10-CM

## 2020-10-02 DIAGNOSIS — M1000 Idiopathic gout, unspecified site: Secondary | ICD-10-CM | POA: Diagnosis not present

## 2020-10-02 DIAGNOSIS — I63133 Cerebral infarction due to embolism of bilateral carotid arteries: Secondary | ICD-10-CM | POA: Diagnosis not present

## 2020-10-02 DIAGNOSIS — I25110 Atherosclerotic heart disease of native coronary artery with unstable angina pectoris: Secondary | ICD-10-CM | POA: Diagnosis not present

## 2020-10-02 DIAGNOSIS — Z7984 Long term (current) use of oral hypoglycemic drugs: Secondary | ICD-10-CM

## 2020-10-02 DIAGNOSIS — Z8673 Personal history of transient ischemic attack (TIA), and cerebral infarction without residual deficits: Secondary | ICD-10-CM

## 2020-10-02 DIAGNOSIS — Z951 Presence of aortocoronary bypass graft: Secondary | ICD-10-CM

## 2020-10-02 DIAGNOSIS — J9690 Respiratory failure, unspecified, unspecified whether with hypoxia or hypercapnia: Secondary | ICD-10-CM

## 2020-10-02 DIAGNOSIS — I5023 Acute on chronic systolic (congestive) heart failure: Secondary | ICD-10-CM

## 2020-10-02 DIAGNOSIS — E876 Hypokalemia: Secondary | ICD-10-CM | POA: Diagnosis present

## 2020-10-02 DIAGNOSIS — R0602 Shortness of breath: Secondary | ICD-10-CM | POA: Diagnosis present

## 2020-10-02 DIAGNOSIS — Z01810 Encounter for preprocedural cardiovascular examination: Secondary | ICD-10-CM | POA: Diagnosis not present

## 2020-10-02 LAB — BASIC METABOLIC PANEL
Anion gap: 13 (ref 5–15)
BUN: 15 mg/dL (ref 6–20)
CO2: 22 mmol/L (ref 22–32)
Calcium: 8.2 mg/dL — ABNORMAL LOW (ref 8.9–10.3)
Chloride: 105 mmol/L (ref 98–111)
Creatinine, Ser: 0.94 mg/dL (ref 0.44–1.00)
GFR, Estimated: >60 mL/min (ref >60)
Glucose, Bld: 156 mg/dL — ABNORMAL HIGH (ref 70–99)
Potassium: 3.7 mmol/L (ref 3.5–5.1)
Sodium: 140 mmol/L (ref 135–145)

## 2020-10-02 LAB — URINALYSIS, ROUTINE W REFLEX MICROSCOPIC
Bilirubin Urine: NEGATIVE
Glucose, UA: >=500 mg/dL — AB
Hgb urine dipstick: NEGATIVE
Ketones, ur: NEGATIVE mg/dL
Leukocytes,Ua: NEGATIVE
Nitrite: NEGATIVE
Protein, ur: >=300 mg/dL — AB
Specific Gravity, Urine: 1.021 (ref 1.005–1.030)
pH: 5 (ref 5.0–8.0)

## 2020-10-02 LAB — RESP PANEL BY RT-PCR (FLU A&B, COVID) ARPGX2
Influenza A by PCR: NEGATIVE
Influenza B by PCR: NEGATIVE
SARS Coronavirus 2 by RT PCR: NEGATIVE

## 2020-10-02 LAB — COMPREHENSIVE METABOLIC PANEL
ALT: 26 U/L (ref 0–44)
AST: 27 U/L (ref 15–41)
Albumin: 2.9 g/dL — ABNORMAL LOW (ref 3.5–5.0)
Alkaline Phosphatase: 64 U/L (ref 38–126)
Anion gap: 16 — ABNORMAL HIGH (ref 5–15)
BUN: 13 mg/dL (ref 6–20)
CO2: 18 mmol/L — ABNORMAL LOW (ref 22–32)
Calcium: 8.3 mg/dL — ABNORMAL LOW (ref 8.9–10.3)
Chloride: 104 mmol/L (ref 98–111)
Creatinine, Ser: 0.96 mg/dL (ref 0.44–1.00)
GFR, Estimated: >60 mL/min (ref >60)
Glucose, Bld: 307 mg/dL — ABNORMAL HIGH (ref 70–99)
Potassium: 3.3 mmol/L — ABNORMAL LOW (ref 3.5–5.1)
Sodium: 138 mmol/L (ref 135–145)
Total Bilirubin: 0.4 mg/dL (ref 0.3–1.2)
Total Protein: 7.3 g/dL (ref 6.5–8.1)

## 2020-10-02 LAB — CBC
HCT: 38.3 % (ref 36.0–46.0)
Hemoglobin: 11.3 g/dL — ABNORMAL LOW (ref 12.0–15.0)
MCH: 27 pg (ref 26.0–34.0)
MCHC: 29.5 g/dL — ABNORMAL LOW (ref 30.0–36.0)
MCV: 91.6 fL (ref 80.0–100.0)
Platelets: 356 10*3/uL (ref 150–400)
RBC: 4.18 MIL/uL (ref 3.87–5.11)
RDW: 14.1 % (ref 11.5–15.5)
WBC: 11.6 10*3/uL — ABNORMAL HIGH (ref 4.0–10.5)
nRBC: 0 % (ref 0.0–0.2)

## 2020-10-02 LAB — I-STAT VENOUS BLOOD GAS, ED
Acid-base deficit: 10 mmol/L — ABNORMAL HIGH (ref 0.0–2.0)
Bicarbonate: 20.7 mmol/L (ref 20.0–28.0)
Calcium, Ion: 1.12 mmol/L — ABNORMAL LOW (ref 1.15–1.40)
HCT: 37 % (ref 36.0–46.0)
Hemoglobin: 12.6 g/dL (ref 12.0–15.0)
O2 Saturation: 98 %
Potassium: 3.4 mmol/L — ABNORMAL LOW (ref 3.5–5.1)
Sodium: 141 mmol/L (ref 135–145)
TCO2: 23 mmol/L (ref 22–32)
pCO2, Ven: 65.8 mmHg — ABNORMAL HIGH (ref 44.0–60.0)
pH, Ven: 7.106 — CL (ref 7.250–7.430)
pO2, Ven: 133 mmHg — ABNORMAL HIGH (ref 32.0–45.0)

## 2020-10-02 LAB — I-STAT ARTERIAL BLOOD GAS, ED
Acid-base deficit: 5 mmol/L — ABNORMAL HIGH (ref 0.0–2.0)
Bicarbonate: 21.7 mmol/L (ref 20.0–28.0)
Calcium, Ion: 1.15 mmol/L (ref 1.15–1.40)
HCT: 32 % — ABNORMAL LOW (ref 36.0–46.0)
Hemoglobin: 10.9 g/dL — ABNORMAL LOW (ref 12.0–15.0)
O2 Saturation: 84 %
Patient temperature: 98.9
Potassium: 3.1 mmol/L — ABNORMAL LOW (ref 3.5–5.1)
Sodium: 139 mmol/L (ref 135–145)
TCO2: 23 mmol/L (ref 22–32)
pCO2 arterial: 48.3 mmHg — ABNORMAL HIGH (ref 32.0–48.0)
pH, Arterial: 7.261 — ABNORMAL LOW (ref 7.350–7.450)
pO2, Arterial: 57 mmHg — ABNORMAL LOW (ref 83.0–108.0)

## 2020-10-02 LAB — COOXEMETRY PANEL
Carboxyhemoglobin: 1 % (ref 0.5–1.5)
Methemoglobin: 1 % (ref 0.0–1.5)
O2 Saturation: 74.9 %
Total hemoglobin: 9.5 g/dL — ABNORMAL LOW (ref 12.0–16.0)

## 2020-10-02 LAB — GLUCOSE, CAPILLARY
Glucose-Capillary: 102 mg/dL — ABNORMAL HIGH (ref 70–99)
Glucose-Capillary: 130 mg/dL — ABNORMAL HIGH (ref 70–99)
Glucose-Capillary: 140 mg/dL — ABNORMAL HIGH (ref 70–99)
Glucose-Capillary: 165 mg/dL — ABNORMAL HIGH (ref 70–99)

## 2020-10-02 LAB — LACTIC ACID, PLASMA
Lactic Acid, Venous: 6.3 mmol/L (ref 0.5–1.9)
Lactic Acid, Venous: 4.1 mmol/L (ref 0.5–1.9)
Lactic Acid, Venous: 3.3 mmol/L (ref 0.5–1.9)
Lactic Acid, Venous: 1.9 mmol/L (ref 0.5–1.9)

## 2020-10-02 LAB — HEPARIN LEVEL (UNFRACTIONATED): Heparin Unfractionated: 0.42 IU/mL (ref 0.30–0.70)

## 2020-10-02 LAB — TROPONIN I (HIGH SENSITIVITY)
Troponin I (High Sensitivity): 94 ng/L — ABNORMAL HIGH (ref <18)
Troponin I (High Sensitivity): 673 ng/L (ref <18)
Troponin I (High Sensitivity): 3190 ng/L (ref <18)
Troponin I (High Sensitivity): 2868 ng/L (ref <18)

## 2020-10-02 LAB — MRSA PCR SCREENING: MRSA by PCR: NEGATIVE

## 2020-10-02 LAB — PROTIME-INR
INR: 1.1 (ref 0.8–1.2)
Prothrombin Time: 13.4 seconds (ref 11.4–15.2)

## 2020-10-02 LAB — ECHOCARDIOGRAM LIMITED
Area-P 1/2: 3.37 cm2
S' Lateral: 3.4 cm

## 2020-10-02 LAB — BRAIN NATRIURETIC PEPTIDE: B Natriuretic Peptide: 947.1 pg/mL — ABNORMAL HIGH (ref 0.0–100.0)

## 2020-10-02 LAB — APTT: aPTT: 23 seconds — ABNORMAL LOW (ref 24–36)

## 2020-10-02 LAB — PROCALCITONIN: Procalcitonin: 1.47 ng/mL

## 2020-10-02 MED ORDER — NITROGLYCERIN IN D5W 200-5 MCG/ML-% IV SOLN
0.0000 ug/min | INTRAVENOUS | Status: DC
  Administered 2020-10-02: 25 ug/min via INTRAVENOUS

## 2020-10-02 MED ORDER — METOPROLOL TARTRATE 5 MG/5ML IV SOLN: 5.0000 mg | Freq: Once | INTRAVENOUS | Status: DC

## 2020-10-02 MED ORDER — SODIUM CHLORIDE 0.9% FLUSH
10.0000 mL | INTRAVENOUS | Status: DC | PRN
Start: 2020-10-02 — End: 2020-10-09

## 2020-10-02 MED ORDER — CHLORHEXIDINE GLUCONATE 0.12% ORAL RINSE (MEDLINE KIT)
15.0000 mL | Freq: Two times a day (BID) | OROMUCOSAL | Status: DC
  Administered 2020-10-02 – 2020-10-03 (×2): 15 mL via OROMUCOSAL

## 2020-10-02 MED ORDER — POTASSIUM CHLORIDE 20 MEQ PO PACK
40.0000 meq | PACK | Freq: Every day | ORAL | Status: DC
  Administered 2020-10-02 – 2020-10-03 (×2): 40 meq
  Filled 2020-10-02 (×2): qty 2

## 2020-10-02 MED ORDER — PANTOPRAZOLE SODIUM 40 MG IV SOLR
40.0000 mg | Freq: Every day | INTRAVENOUS | Status: DC
  Administered 2020-10-02 – 2020-10-03 (×2): 40 mg via INTRAVENOUS
  Filled 2020-10-02 (×2): qty 40

## 2020-10-02 MED ORDER — POLYETHYLENE GLYCOL 3350 17 G PO PACK: 17.0000 g | PACK | Freq: Every day | ORAL | Status: DC | PRN

## 2020-10-02 MED ORDER — CHLORHEXIDINE GLUCONATE CLOTH 2 % EX PADS
6.0000 | MEDICATED_PAD | Freq: Every day | CUTANEOUS | Status: DC
  Administered 2020-10-02 – 2020-10-08 (×5): 6 via TOPICAL

## 2020-10-02 MED ORDER — SODIUM CHLORIDE 0.9% FLUSH
10.0000 mL | Freq: Two times a day (BID) | INTRAVENOUS | Status: DC
  Administered 2020-10-02 – 2020-10-06 (×8): 10 mL
  Administered 2020-10-07: 20 mL
  Administered 2020-10-07 – 2020-10-08 (×3): 10 mL

## 2020-10-02 MED ORDER — HEPARIN (PORCINE) 25000 UT/250ML-% IV SOLN
1600.0000 [IU]/h | INTRAVENOUS | Status: DC
  Administered 2020-10-02 – 2020-10-05 (×5): 1400 [IU]/h via INTRAVENOUS
  Administered 2020-10-05: 22:00:00 1500 [IU]/h via INTRAVENOUS
  Administered 2020-10-06 – 2020-10-08 (×3): 1600 [IU]/h via INTRAVENOUS
  Filled 2020-10-02 (×13): qty 250

## 2020-10-02 MED ORDER — ACETAMINOPHEN 160 MG/5ML PO SOLN
650.0000 mg | Freq: Four times a day (QID) | ORAL | Status: DC | PRN
  Administered 2020-10-02 – 2020-10-03 (×2): 650 mg
  Filled 2020-10-02 (×2): qty 20.3

## 2020-10-02 MED ORDER — SODIUM CHLORIDE 0.9 % IV SOLN
INTRAVENOUS | Status: DC | PRN
  Administered 2020-10-02: 250 mL via INTRAVENOUS

## 2020-10-02 MED ORDER — FUROSEMIDE 10 MG/ML IJ SOLN: 80.0000 mg | Freq: Two times a day (BID) | INTRAMUSCULAR | Status: DC

## 2020-10-02 MED ORDER — FUROSEMIDE 10 MG/ML IJ SOLN
40.0000 mg | Freq: Once | INTRAMUSCULAR | Status: AC
  Administered 2020-10-02: 40 mg via INTRAVENOUS
  Filled 2020-10-02: qty 4

## 2020-10-02 MED ORDER — POLYETHYLENE GLYCOL 3350 17 G PO PACK
17.0000 g | PACK | Freq: Every day | ORAL | Status: DC
  Administered 2020-10-03 – 2020-10-08 (×3): 17 g
  Filled 2020-10-02 (×5): qty 1

## 2020-10-02 MED ORDER — ATORVASTATIN CALCIUM 80 MG PO TABS
80.0000 mg | ORAL_TABLET | Freq: Every day | ORAL | Status: DC
  Administered 2020-10-03: 80 mg
  Filled 2020-10-02 (×2): qty 1

## 2020-10-02 MED ORDER — PROPOFOL 1000 MG/100ML IV EMUL
5.0000 ug/kg/min | INTRAVENOUS | Status: DC
  Administered 2020-10-02 (×2): 20 ug/kg/min via INTRAVENOUS
  Administered 2020-10-02: 40 ug/kg/min via INTRAVENOUS
  Administered 2020-10-03: 20 ug/kg/min via INTRAVENOUS
  Administered 2020-10-03: 40 ug/kg/min via INTRAVENOUS
  Filled 2020-10-02: qty 100
  Filled 2020-10-02: qty 200
  Filled 2020-10-02 (×2): qty 100

## 2020-10-02 MED ORDER — SPIRONOLACTONE 12.5 MG HALF TABLET
12.5000 mg | ORAL_TABLET | Freq: Every day | ORAL | Status: DC
  Administered 2020-10-02: 12.5 mg via ORAL
  Filled 2020-10-02: qty 1

## 2020-10-02 MED ORDER — POTASSIUM CHLORIDE 10 MEQ/100ML IV SOLN
10.0000 meq | INTRAVENOUS | Status: AC
  Administered 2020-10-02 (×2): 10 meq via INTRAVENOUS

## 2020-10-02 MED ORDER — HEPARIN SODIUM (PORCINE) 5000 UNIT/ML IJ SOLN: 5000.0000 [IU] | Freq: Three times a day (TID) | INTRAMUSCULAR | Status: DC

## 2020-10-02 MED ORDER — METOPROLOL TARTRATE 12.5 MG HALF TABLET
12.5000 mg | ORAL_TABLET | Freq: Two times a day (BID) | ORAL | Status: DC
  Filled 2020-10-02: qty 1

## 2020-10-02 MED ORDER — ASPIRIN 81 MG PO CHEW
81.0000 mg | CHEWABLE_TABLET | Freq: Every day | ORAL | Status: DC
  Administered 2020-10-03: 81 mg
  Filled 2020-10-02 (×2): qty 1

## 2020-10-02 MED ORDER — POTASSIUM CHLORIDE 10 MEQ/100ML IV SOLN
10.0000 meq | INTRAVENOUS | Status: AC
  Administered 2020-10-02 (×2): 10 meq via INTRAVENOUS
  Filled 2020-10-02 (×6): qty 100

## 2020-10-02 MED ORDER — PERFLUTREN LIPID MICROSPHERE
1.0000 mL | INTRAVENOUS | Status: AC | PRN
  Administered 2020-10-02: 3 mL via INTRAVENOUS
  Filled 2020-10-02: qty 10

## 2020-10-02 MED ORDER — VANCOMYCIN HCL 10 G IV SOLR
2250.0000 mg | Freq: Once | INTRAVENOUS | Status: DC
  Filled 2020-10-02: qty 2250

## 2020-10-02 MED ORDER — INSULIN ASPART 100 UNIT/ML ~~LOC~~ SOLN
0.0000 [IU] | SUBCUTANEOUS | Status: DC
  Administered 2020-10-02 (×2): 3 [IU] via SUBCUTANEOUS
  Administered 2020-10-02: 4 [IU] via SUBCUTANEOUS
  Administered 2020-10-03 (×5): 3 [IU] via SUBCUTANEOUS
  Administered 2020-10-04: 4 [IU] via SUBCUTANEOUS
  Administered 2020-10-04: 3 [IU] via SUBCUTANEOUS
  Administered 2020-10-04 (×2): 4 [IU] via SUBCUTANEOUS
  Administered 2020-10-05 (×3): 3 [IU] via SUBCUTANEOUS
  Administered 2020-10-05: 4 [IU] via SUBCUTANEOUS
  Administered 2020-10-05: 3 [IU] via SUBCUTANEOUS
  Administered 2020-10-06: 4 [IU] via SUBCUTANEOUS
  Administered 2020-10-06: 3 [IU] via SUBCUTANEOUS
  Administered 2020-10-06: 4 [IU] via SUBCUTANEOUS
  Administered 2020-10-06 (×2): 7 [IU] via SUBCUTANEOUS

## 2020-10-02 MED ORDER — ETOMIDATE 2 MG/ML IV SOLN
20.0000 mg | Freq: Once | INTRAVENOUS | Status: AC
  Administered 2020-10-02: 20 mg via INTRAVENOUS

## 2020-10-02 MED ORDER — FENTANYL CITRATE (PF) 100 MCG/2ML IJ SOLN
50.0000 ug | INTRAMUSCULAR | Status: DC | PRN
  Administered 2020-10-02 (×2): 100 ug via INTRAVENOUS
  Filled 2020-10-02 (×2): qty 2

## 2020-10-02 MED ORDER — VANCOMYCIN HCL 1500 MG/300ML IV SOLN: 1500.0000 mg | INTRAVENOUS | Status: DC

## 2020-10-02 MED ORDER — PROPOFOL 1000 MG/100ML IV EMUL
INTRAVENOUS | Status: AC
  Administered 2020-10-02: 20 ug/kg/min via INTRAVENOUS
  Filled 2020-10-02: qty 100

## 2020-10-02 MED ORDER — AMIODARONE LOAD VIA INFUSION
150.0000 mg | Freq: Once | INTRAVENOUS | Status: AC
  Administered 2020-10-02: 150 mg via INTRAVENOUS
  Filled 2020-10-02: qty 83.34

## 2020-10-02 MED ORDER — FENTANYL CITRATE (PF) 100 MCG/2ML IJ SOLN: 50.0000 ug | INTRAMUSCULAR | Status: DC | PRN

## 2020-10-02 MED ORDER — ONDANSETRON HCL 4 MG/2ML IJ SOLN
4.0000 mg | Freq: Four times a day (QID) | INTRAMUSCULAR | Status: DC | PRN
  Administered 2020-10-02 (×2): 4 mg via INTRAVENOUS
  Filled 2020-10-02 (×2): qty 2

## 2020-10-02 MED ORDER — ORAL CARE MOUTH RINSE
15.0000 mL | OROMUCOSAL | Status: DC
  Administered 2020-10-02 – 2020-10-03 (×10): 15 mL via OROMUCOSAL

## 2020-10-02 MED ORDER — SODIUM CHLORIDE 0.9 % IV SOLN
2.0000 g | Freq: Three times a day (TID) | INTRAVENOUS | Status: AC
  Administered 2020-10-02 – 2020-10-08 (×21): 2 g via INTRAVENOUS
  Filled 2020-10-02 (×22): qty 2

## 2020-10-02 MED ORDER — AMIODARONE HCL IN DEXTROSE 360-4.14 MG/200ML-% IV SOLN
30.0000 mg/h | INTRAVENOUS | Status: DC
  Administered 2020-10-02 – 2020-10-07 (×11): 30 mg/h via INTRAVENOUS
  Filled 2020-10-02 (×10): qty 200

## 2020-10-02 MED ORDER — DOCUSATE SODIUM 100 MG PO CAPS: 100.0000 mg | ORAL_CAPSULE | Freq: Two times a day (BID) | ORAL | Status: DC | PRN

## 2020-10-02 MED ORDER — PERFLUTREN LIPID MICROSPHERE
INTRAVENOUS | Status: AC
  Filled 2020-10-02: qty 10

## 2020-10-02 MED ORDER — HEPARIN BOLUS VIA INFUSION
4000.0000 [IU] | Freq: Once | INTRAVENOUS | Status: AC
  Administered 2020-10-02: 4000 [IU] via INTRAVENOUS
  Filled 2020-10-02: qty 4000

## 2020-10-02 MED ORDER — FUROSEMIDE 10 MG/ML IJ SOLN: 40.0000 mg | Freq: Two times a day (BID) | INTRAMUSCULAR | Status: DC

## 2020-10-02 MED ORDER — ROCURONIUM BROMIDE 100 MG/10ML IV SOLN
100.0000 mg | Freq: Once | INTRAVENOUS | Status: AC
  Administered 2020-10-02: 100 mg via INTRAVENOUS
  Filled 2020-10-02: qty 10

## 2020-10-02 MED ORDER — SPIRONOLACTONE 12.5 MG HALF TABLET
12.5000 mg | ORAL_TABLET | Freq: Every day | ORAL | Status: DC
  Administered 2020-10-03: 12.5 mg
  Filled 2020-10-02: qty 1

## 2020-10-02 MED ORDER — DOCUSATE SODIUM 50 MG/5ML PO LIQD
100.0000 mg | Freq: Two times a day (BID) | ORAL | Status: DC
  Administered 2020-10-02 – 2020-10-03 (×2): 100 mg
  Filled 2020-10-02 (×4): qty 10

## 2020-10-02 MED ORDER — AMIODARONE HCL IN DEXTROSE 360-4.14 MG/200ML-% IV SOLN
60.0000 mg/h | INTRAVENOUS | Status: AC
  Administered 2020-10-02: 60 mg/h via INTRAVENOUS
  Filled 2020-10-02 (×2): qty 200

## 2020-10-02 NOTE — Progress Notes (Signed)
Peripherally Inserted Central Catheter Placement  The IV Nurse has discussed with the patient and/or persons authorized to consent for the patient, the purpose of this procedure and the potential benefits and risks involved with this procedure.  The benefits include less needle sticks, lab draws from the catheter, and the patient may be discharged home with the catheter. Risks include, but not limited to, infection, bleeding, blood clot (thrombus formation), and puncture of an artery; nerve damage and irregular heartbeat and possibility to perform a PICC exchange if needed/ordered by physician.  Alternatives to this procedure were also discussed.  Bard Power PICC patient education guide, fact sheet on infection prevention and patient information card has been provided to patient /or left at bedside.    PICC Placement Documentation  PICC Triple Lumen 10/02/20 PICC Right Brachial 37 cm 0 cm (Active)  Indication for Insertion or Continuance of Line Prolonged intravenous therapies 10/02/20 1757  Exposed Catheter (cm) 0 cm 10/02/20 1757  Site Assessment Clean;Dry;Intact 10/02/20 1757  Lumen #1 Status Flushed;Blood return noted;Saline locked 10/02/20 1757  Lumen #2 Status Flushed;Blood return noted;Saline locked 10/02/20 1757  Lumen #3 Status Flushed;Blood return noted;Saline locked 10/02/20 1757  Dressing Type Transparent 10/02/20 1757  Dressing Status Clean;Dry;Intact 10/02/20 1757  Antimicrobial disc in place? Yes 10/02/20 1757  Dressing Change Due 10/09/20 10/02/20 1757       Scotty Court 10/02/2020, 6:00 PM

## 2020-10-02 NOTE — Progress Notes (Signed)
Sputum sample obtained and sent to the lab.

## 2020-10-02 NOTE — ED Provider Notes (Signed)
[facility] Emergency Department Provider Note MRN:  [id number]  Arrival date & time: 10/02/20     Chief Complaint   Respiratory Distress   History of Present Illness   Tiffany Le is a 45 y.o. year-old female with a history of CAD, hypertension, diabetes presenting to the ED with chief complaint of respiratory distress.  Acute onset shortness of breath, worsening despite BiPAP with EMS.  Saturations in the 18s.  Becoming more somnolent.  I was unable to obtain an accurate HPI, PMH, or ROS due to the patient's altered mental status.  Level 5 caveat.  Review of Systems  Positive for shortness of breath, altered mental status.  Patient's Health History    Past Medical History:  Diagnosis Date  . Coronary artery disease   . Diabetes mellitus without complication ([place])   . Diabetic neuropathy ([place]) 02/2020  . Hyperlipidemia age 18  . Hypertension age 41  . Hypocalcemia 11/2019  . Hypokalemia 11/2019  . Iron deficiency anemia   . Obesity   . Proteinuria 12/09/2019    Past Surgical History:  Procedure Laterality Date  . CARDIAC CATHETERIZATION  09/25/2020  . CESAREAN SECTION  3/96  . LEFT HEART CATH AND CORONARY ANGIOGRAPHY N/A 09/25/2020   Procedure: LEFT HEART CATH AND CORONARY ANGIOGRAPHY;  Surgeon: Leonie Man, MD;  Location: [facility] CV LAB;  Service: Cardiovascular;  Laterality: N/A;  . ORIF FEMUR FRACTURE      Family History  Problem Relation Age of Onset  . Heart disease Mother        CABG <50  . Hypertension Mother   . Diabetes Mother   . Hyperlipidemia Mother   . Heart disease Father   . Hyperlipidemia Father   . Hypertension Father   . Early death Father   . Healthy Daughter   . Diabetes Maternal Aunt   . Cancer Maternal Grandmother        stomach cancer  . Diabetes Maternal Aunt   . Cancer Cousin        colon cancer (dx'd 8)    Social History   Socioeconomic History  . Marital status: Single    Spouse name:  Not on file  . Number of children: Not on file  . Years of education: Not on file  . Highest education level: Not on file  Occupational History  . Occupation: Event organiser  Tobacco Use  . Smoking status: Never Smoker  . Smokeless tobacco: Never Used  Vaping Use  . Vaping Use: Never used  Substance and Sexual Activity  . Alcohol use: No  . Drug use: No  . Sexual activity: Not Currently    Partners: Male    Comment: husband currently in [place], returning 02/2013  Other Topics Concern  . 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  Lives with 56 year old daughter, 1 cat Husband is living in [place] (citizen there), trying to come to [place] (has been delayed)   Social Determinants of Radio broadcast assistant Strain: Not on file  Food Insecurity: Not on file  Transportation Needs: Not on file  Physical Activity: Not on file  Stress: Not on file  Social Connections: Not on file  Intimate Partner Violence: Not on file     Physical Exam   Vitals:   10/02/20 0615 10/02/20 0630  BP: 104/66 101/68  Pulse: (!) 114 (!) 114  Resp: (!) 24 (!) 24  Temp:    SpO2: (!) 89% 91%    CONSTITUTIONAL: Ill-appearing NEURO: Difficult to wake, does not follow commands EYES:  eyes equal and reactive ENT/NECK:  no LAD, no JVD CARDIO: Tachycardic rate, cold and poorly perfused PULM: Diffuse rhonchi, tachypneic with inadequate breaths GI/GU:  normal bowel sounds, non-distended, non-tender MSK/SPINE:  No gross deformities, no edema SKIN:  no rash, atraumatic PSYCH: Unable to assess  *Additional and/or pertinent findings included in MDM below  Diagnostic and Interventional Summary    EKG Interpretation  Date/Time:  Tuesday October 02 2020 04:57:27 EST Ventricular Rate:  139 PR Interval:    QRS Duration: 75 QT Interval:  275 QTC Calculation: 419 R Axis:   46 Text Interpretation: Sinus tachycardia Probable left atrial  enlargement Anterior infarct, acute (LAD) >>> Acute MI <<< Confirmed by Gerlene Fee [id number]) on 10/02/2020 5:21:31 AM      Labs Reviewed  CBC - Abnormal; Notable for the following components:      Result Value   WBC 11.6 (*)    Hemoglobin 11.3 (*)    MCHC 29.5 (*)    All other components within normal limits  COMPREHENSIVE METABOLIC PANEL - Abnormal; Notable for the following components:   Potassium 3.3 (*)    CO2 18 (*)    Glucose, Bld 307 (*)    Calcium 8.3 (*)    Albumin 2.9 (*)    Anion gap 16 (*)    All other components within normal limits  LACTIC ACID, PLASMA - Abnormal; Notable for the following components:   Lactic Acid, Venous 6.3 (*)    All other components within normal limits  APTT - Abnormal; Notable for the following components:   aPTT 23 (*)    All other components within normal limits  I-STAT VENOUS BLOOD GAS, ED - Abnormal; Notable for the following components:   pH, Ven 7.106 (*)    pCO2, Ven 65.8 (*)    pO2, Ven 133.0 (*)    Acid-base deficit 10.0 (*)    Potassium 3.4 (*)    Calcium, Ion 1.12 (*)    All other components within normal limits  I-STAT ARTERIAL BLOOD GAS, ED - Abnormal; Notable for the following components:   pH, Arterial 7.261 (*)    pCO2 arterial 48.3 (*)    pO2, Arterial 57 (*)    Acid-base deficit 5.0 (*)    Potassium 3.1 (*)    HCT 32.0 (*)    Hemoglobin 10.9 (*)    All other components within normal limits  TROPONIN I (HIGH SENSITIVITY) - Abnormal; Notable for the following components:   Troponin I (High Sensitivity) 94 (*)    All other components within normal limits  RESP PANEL BY RT-PCR (FLU A&B, COVID) ARPGX2  PROTIME-INR  BLOOD GAS, ARTERIAL  URINALYSIS, ROUTINE W REFLEX MICROSCOPIC  BRAIN NATRIURETIC PEPTIDE  TROPONIN I (HIGH SENSITIVITY)    DG Chest Portable 1 View  Final Result      Medications  nitroGLYCERIN 50 mg in dextrose 5 % 250 mL (0.2 mg/mL) infusion (0 mcg/min Intravenous Stopped 10/02/20 0604)  propofol  (DIPRIVAN) 1000 MG/100ML infusion (20 mcg/kg/min  111.1 kg Intravenous Rate/Dose Change 10/02/20 0637)  metoprolol tartrate (LOPRESSOR) injection 5 mg (5 mg Intravenous Not Given 10/02/20 0555)  etomidate (AMIDATE) injection 20 mg (20 mg Intravenous Given 10/02/20 0452)  rocuronium (ZEMURON) injection 100 mg (100 mg Intravenous Given 10/02/20 0452)  furosemide (LASIX) injection 40 mg (40 mg Intravenous Given 10/02/20  5277)     Procedures  /  Critical Care .Critical Care Performed by: Maudie Flakes, MD Authorized by: Maudie Flakes, MD   Critical care provider statement:    Critical care time (minutes):  45   Critical care was necessary to treat or prevent imminent or life-threatening deterioration of the following conditions:  Respiratory failure and cardiac failure   Critical care was time spent personally by me on the following activities:  Discussions with consultants, evaluation of patient's response to treatment, examination of patient, ordering and performing treatments and interventions, ordering and review of laboratory studies, ordering and review of radiographic studies, pulse oximetry, re-evaluation of patient's condition, obtaining history from patient or surrogate and review of old charts Procedure Name: Intubation Date/Time: 10/02/2020 5:21 AM Performed by: Maudie Flakes, MD Pre-anesthesia Checklist: Patient identified, Patient being monitored, Emergency Drugs available, Timeout performed and Suction available Oxygen Delivery Method: Non-rebreather mask Preoxygenation: Pre-oxygenation with 100% oxygen Induction Type: Rapid sequence Ventilation: Mask ventilation without difficulty Laryngoscope Size: Glidescope and 4 Grade View: Grade I Tube size: 7.5 mm Number of attempts: 1 Airway Equipment and Method: Rigid stylet Placement Confirmation: ETT inserted through vocal cords under direct vision,  CO2 detector and Breath sounds checked- equal and bilateral Secured at: 24  cm Comments: RSI intubation complicated by initial IV that infiltrated 20 mg etomidate into the soft tissues.  Second IV quickly obtained, 10 mg etomidate then given intravenously followed by 100 mg rocuronium.       ED Course and Medical Decision Making  I have reviewed the triage vital signs, the nursing notes, and pertinent available records from the EMR.  Listed above are laboratory and imaging tests that I personally ordered, reviewed, and interpreted and then considered in my medical decision making (see below for details).  Suspect due to acute pulmonary edema with profound hypoxia despite BiPAP, becoming more somnolent, necessitating RSI intubation as described above.  X-ray confirms tube location and confirms diffuse pulmonary edema.  Patient receiving nitro drip, Lasix.  Persistently tachycardic, with concern that this rate is impairing diastole, will provide small dose metoprolol and monitor for effectiveness.  Also receiving propofol for sedation.  EKG obtained after intubation with global ischemia.  I discussed this EKG with Dr. Paticia Stack of cardiology, agrees that given the clinical picture this with not qualify as a code STEMI.  Of note, patient has known severe coronary disease and recently left the hospital [place].  Cardiology was considering PCI versus CABG.  Plan is to repeat an EKG to see if she is improving, will need ICU admission.     Accepted by ICU, will also consult cardiology given her recent cath and ischemic EKG.  Barth Kirks. Sedonia Small, MD [place] [email or web address]  Final Clinical Impressions(s) / ED Diagnoses     ICD-10-CM   1. Acute pulmonary edema (HCC)  J81.0   2. Acute respiratory failure with hypoxia (HCC)  J96.01     ED Discharge Orders    None       Discharge Instructions Discussed with and Provided to Patient:   Discharge Instructions   None       Maudie Flakes, MD 10/02/20  [phone]

## 2020-10-02 NOTE — Progress Notes (Signed)
Notified [place] MD of increase in troponins. Patient on heparin gtt, awaiting surgery. Also received order for tylenol prn for fever.

## 2020-10-02 NOTE — Progress Notes (Signed)
[facility] for Heparin Indication: chest pain/ACS  Allergies  Allergen Reactions  . Penicillins Rash    Historical Has patient had a PCN reaction causing immediate rash, facial/tongue/throat swelling, SOB or lightheadedness with hypotension: No Has patient had a PCN reaction causing severe rash involving mucus membranes or skin necrosis: No Has patient had a PCN reaction that required hospitalization: No Has patient had a PCN reaction occurring within the last 10 years: No If all of the above answers are "NO", then may proceed with Cephalosporin use.    Patient Measurements:   Heparin Dosing Weight: 80.6 kg  Vital Signs: Temp: 101.6 F (38.7 C) (02/22 1514) Temp Source: Oral (02/22 1200) BP: 109/71 (02/22 1500) Pulse Rate: 112 (02/22 1500)  Labs: Recent Labs    10/02/20 0516 10/02/20 0535 10/02/20 0554 10/02/20 0818 10/02/20 1426 10/02/20 1517  HGB 11.3* 12.6 10.9*  --   --   --   HCT 38.3 37.0 32.0*  --   --   --   PLT 356  --   --   --   --   --   APTT 23*  --   --   --   --   --   LABPROT 13.4  --   --   --   --   --   INR 1.1  --   --   --   --   --   HEPARINUNFRC  --   --   --   --   --  0.42  CREATININE 0.96  --   --   --  0.94  --   TROPONINIHS 94*  --   --  673*  --   --     Estimated Creatinine Clearance: 93.2 mL/min (by C-G formula based on SCr of 0.94 mg/dL).   Medical History: Past Medical History:  Diagnosis Date  . Coronary artery disease   . Diabetes mellitus without complication ([place])   . Diabetic neuropathy ([place]) 02/2020  . Hyperlipidemia age 40  . Hypertension age 48  . Hypocalcemia 11/2019  . Hypokalemia 11/2019  . Iron deficiency anemia   . Obesity   . Proteinuria 12/09/2019    Medications:  Infusions:  . sodium chloride Stopped (10/02/20 1453)  . amiodarone    . ceFEPime (MAXIPIME) IV Stopped (10/02/20 1335)  . heparin 1,400 Units/hr (10/02/20 1500)  . potassium chloride    . propofol  (DIPRIVAN) infusion 20 mcg/kg/min (10/02/20 1500)    Assessment: 45 y.o. female admitted with respiratory distress, intubated in the ED. Patient recently admitted for NSTEMI, found to have severe multivessel CAD, left AMA 09/27/20 after refusing CABG. Pt was also offered high risk PCI which was refused as well. Pharmacy has been consulted to start heparin.   Initial heparin level therapeutic at 0.42.  Goal of Therapy:  Heparin level 0.3-0.7 units/ml Monitor platelets by anticoagulation protocol: Yes   Plan:  Continue heparin 1400 units/h Daily heparin level and CBC   Arrie Senate, PharmD, [place], [facility] Clinical Pharmacist [phone] Please check AMION for all [place] numbers 10/02/2020

## 2020-10-02 NOTE — H&P (Addendum)
NAME:  Tiffany Le, MRN:  [id number], DOB:  [birth year], LOS: 0 ADMISSION DATE:  10/02/2020, CONSULTATION DATE:  10/02/2020 REFERRING MD:  Sedonia Small, CHIEF COMPLAINT:  Respiratory distress   Brief History:  45 year old female who presented via EMS for acute onset of SOB and hypoxia with desats to 50s on BiPAP. Recently admitted 2/14-2/17 for SOB/NSTEMI with LHC demonstrating severe CAD. Recommended for CABG vs. PCI, refused and signed out AMA.  History of Present Illness:  Tiffany Le is a 45 year old female with PMHx significant for HTN, HLD, T2DM, CAD (severe, LHC 7/37/10), acute systolic/diastolic HF (LVEF 62-69% 4/85/46). Patient was recently admitted to [facility] 2/14 - 2/17 for SOB, found to have elevated troponins and NSTEMI. During this admission, LHC was completed demonstrating severe multivessel CAD with recommendation for CABG vs. PCI. Patient reportedly was concerned about undergoing inpatient procedure and refused at that time, signing out AMA. Discharged on ASA/BB/statin.  Patient was brought in by EMS today for respiratory distress. Reportedly experienced acute onset of SOB and on EMS arrival with increasing somnolence and was saturating in the 50s despite BiPAP administration. She was subsequently intubated in the ER with improvement in saturations.  On arrival to ED patient was tachycardic, post-intubation O2 sats 82-91%, ABG 7.26/48/57/21, WBC 11.6, stable Hgb 11.3, K 3.3, Bicarb 18, Cr 0.96. Lactate was elevated at 6.3 with trop 94 (926 1 week prior during NSTEMI). COVID was negative. PCCM was consulted for admission and further workup.  Past Medical History:   Past Medical History:  Diagnosis Date  . Coronary artery disease   . Diabetes mellitus without complication ([place])   . Diabetic neuropathy ([place]) 02/2020  . Hyperlipidemia age 38  . Hypertension age 16  . Hypocalcemia 11/2019  . Hypokalemia 11/2019  . Iron deficiency anemia   . Obesity   . Proteinuria 12/09/2019    Significant Hospital Events:  2/22 Admitted for SOB, hypoxia and respiratory distress  Consults:    Procedures:  ETT 2/22 >>  Significant Diagnostic Tests:   2/22 CXR >> Endotracheal tube tip terminates in the mid to lower trachea, 2cm from the carina. Interval development of dense consolidative opacity within both lungs, most pronounced in the right lung and in the left mid lung, possible latering L pleural fluid  Micro Data:  2/22 COVID >> negative 2/22 BCx >> 2/22 Resp Cx >>  Antimicrobials:  Cefepime 2/22 >> Vanc 2/22 >>  Interim History / Subjective:    Objective   Blood pressure 101/68, pulse (!) 114, temperature 98.9 F (37.2 C), temperature source Oral, resp. rate (!) 24, SpO2 91 %.    Vent Mode: PRVC FiO2 (%):  [100 %] 100 % Set Rate:  [24 bmp] 24 bmp Vt Set:  [440 mL] 440 mL PEEP:  [12 cmH20-16 cmH20] 16 cmH20 Plateau Pressure:  [35 cmH20] 35 cmH20  No intake or output data in the 24 hours ending 10/02/20 0743 There were no vitals filed for this visit.  Examination: General: WDWN adult female, intubated/sedated, NAD. HEENT: Anicteric sclera, PERRL. Moist mucous membranes. ETT in place. Neuro: Sedated, mildly agitated with stimuli. Withdraws from painful stimuli/suctioning. +Cough/gag. Moves all 4 extremities spontaneously. CV: S1S2, tachycardic, regular rhythm. PULM: Breathing even and unlabored on vent (PEEP 16, FiO2 100%). Lung fields diminished bilaterally with crackles noted. GI: Soft, nondistended. Extremities: Trace BLE edema. Skin: Warm/dry, no rashes noted.  Resolved Hospital Problem list     Assessment & Plan:  Acute hypoxic respiratory failure - presumed primarily 2/2 acute pulmonary edema  in setting ICM with severe multivessel CAD; however, can not rule out HCAP at this point given CXR findings. - Full vent support. - Wean as able. - Hold lasix for now. - Empiric HCAP coverage and obtain cultures. - Check PCT, low threshold to stop  antibiotics if PCT low. - Follow CXR.  Severe multivessel disease (mLAD 95%, 70% bifurcation high OM1 95%, bifurcation Lcx-OM2 65-70% and small caliber codominant RCA with 85% stenosis) - seen by cards and TCTS on recent admit who recommended CABG while inpatient as well as PCI both of which pt refused and left AMA. Hx HTN, HLD, ICM (recent echo 2/16 with EF 35 - 40% - Will notify cards and TCTS of re-admission.  Hopefully pt will agree to needed surgical interventions this admit. - Continue home ASA, atorvastatin. - Hold home Lisinopril, Metoprolol.  Hypokalemia. - 40 mEq K per tube. - Follow BMP.  Hx DM. - SSI. - Hold home Glipizide, Lantus.  Hx diabetic neuropathy. - Hold home Gabapentin, Cyclobenzaprine.   Best practice (evaluated daily)  Diet: NPO Pain/Anxiety/Delirium protocol (if indicated): Per protocol VAP protocol (if indicated): In place DVT prophylaxis: SQH GI prophylaxis: PPI Glucose control: SSI Mobility: Bedrest Disposition: ICU  Goals of Care:  Last date of multidisciplinary goals of care discussion: Pending Family and staff present:  Summary of discussion:  Follow up goals of care discussion due: 2/29 Code Status: Full  Labs   CBC: Recent Labs  Lab 09/26/20 0253 09/27/20 0221 10/02/20 0516 10/02/20 0535 10/02/20 0554  WBC 5.8 7.8 11.6*  --   --   HGB 10.3* 10.8* 11.3* 12.6 10.9*  HCT 31.4* 34.8* 38.3 37.0 32.0*  MCV 84.4 86.1 91.6  --   --   PLT 296 326 356  --   --     Basic Metabolic Panel: Recent Labs  Lab 09/27/20 0221 10/02/20 0516 10/02/20 0535 10/02/20 0554  NA 135 138 141 139  K 3.6 3.3* 3.4* 3.1*  CL 101 104  --   --   CO2 23 18*  --   --   GLUCOSE 122* 307*  --   --   BUN 20 13  --   --   CREATININE 0.80 0.96  --   --   CALCIUM 8.6* 8.3*  --   --    GFR: Estimated Creatinine Clearance: 91.3 mL/min (by C-G formula based on SCr of 0.96 mg/dL). Recent Labs  Lab 09/26/20 0253 09/27/20 0221 10/02/20 0516  WBC 5.8  7.8 11.6*  LATICACIDVEN  --   --  6.3*    Liver Function Tests: Recent Labs  Lab 10/02/20 0516  AST 27  ALT 26  ALKPHOS 64  BILITOT 0.4  PROT 7.3  ALBUMIN 2.9*   No results for input(s): LIPASE, AMYLASE in the last 168 hours. No results for input(s): AMMONIA in the last 168 hours.  ABG    Component Value Date/Time   PHART 7.261 (L) 10/02/2020 0554   PCO2ART 48.3 (H) 10/02/2020 0554   PO2ART 57 (L) 10/02/2020 0554   HCO3 21.7 10/02/2020 0554   TCO2 23 10/02/2020 0554   ACIDBASEDEF 5.0 (H) 10/02/2020 0554   O2SAT 84.0 10/02/2020 0554     Coagulation Profile: Recent Labs  Lab 10/02/20 0516  INR 1.1    Cardiac Enzymes: No results for input(s): CKTOTAL, CKMB, CKMBINDEX, TROPONINI in the last 168 hours.  HbA1C: Hemoglobin A1C  Date/Time Value Ref Range Status  02/28/2020 04:17 PM 8.3 (A) 4.0 - 5.6 % Final  HbA1c, POC (prediabetic range)  Date/Time Value Ref Range Status  02/28/2020 04:17 PM 8.3 (A) 5.7 - 6.4 % Final   HbA1c, POC (controlled diabetic range)  Date/Time Value Ref Range Status  02/28/2020 04:17 PM 8.3 (A) 0.0 - 7.0 % Final   HbA1c POC (<> result, manual entry)  Date/Time Value Ref Range Status  02/28/2020 04:17 PM 8.3 4.0 - 5.6 % Final   Hgb A1c MFr Bld  Date/Time Value Ref Range Status  09/26/2020 02:53 AM 7.8 (H) 4.8 - 5.6 % Final    Comment:    (NOTE)         Prediabetes: 5.7 - 6.4         Diabetes: >6.4         Glycemic control for adults with diabetes: <7.0   12/02/2019 12:30 PM 11.8 (H) 4.8 - 5.6 % Final    Comment:    (NOTE) Pre diabetes:          5.7%-6.4% Diabetes:              >6.4% Glycemic control for   <7.0% adults with diabetes     CBG: Recent Labs  Lab 09/26/20 1154 09/26/20 1606 09/26/20 2100 09/27/20 0604 09/27/20 1113  GLUCAP 152* 124* 170* 153* 187*    Review of Systems:   Unable to obtain history, patient is intubated/sedated.  Past Medical History:  She,  has a past medical history of Coronary  artery disease, Diabetes mellitus without complication ([place]), Diabetic neuropathy ([place]) (02/2020), Hyperlipidemia (age 60), Hypertension (age 45), Hypocalcemia (11/2019), Hypokalemia (11/2019), Iron deficiency anemia, Obesity, and Proteinuria (12/09/2019).   Surgical History:   Past Surgical History:  Procedure Laterality Date  . CARDIAC CATHETERIZATION  09/25/2020  . CESAREAN SECTION  3/96  . LEFT HEART CATH AND CORONARY ANGIOGRAPHY N/A 09/25/2020   Procedure: LEFT HEART CATH AND CORONARY ANGIOGRAPHY;  Surgeon: Leonie Man, MD;  Location: [facility];  Service: Cardiovascular;  Laterality: N/A;  . ORIF FEMUR FRACTURE       Social History:   reports that she has never smoked. She has never used smokeless tobacco. She reports that she does not drink alcohol and does not use drugs.   Family History:  Her family history includes Cancer in her cousin and maternal grandmother; Diabetes in her maternal aunt, maternal aunt, and mother; Early death in her father; Healthy in her daughter; Heart disease in her father and mother; Hyperlipidemia in her father and mother; Hypertension in her father and mother.   Allergies Allergies  Allergen Reactions  . Penicillins Rash    Historical Has patient had a PCN reaction causing immediate rash, facial/tongue/throat swelling, SOB or lightheadedness with hypotension: No Has patient had a PCN reaction causing severe rash involving mucus membranes or skin necrosis: No Has patient had a PCN reaction that required hospitalization: No Has patient had a PCN reaction occurring within the last 10 years: No If all of the above answers are "NO", then may proceed with Cephalosporin use.     Home Medications  Prior to Admission medications   Medication Sig Start Date End Date Taking? Authorizing Provider  albuterol (PROVENTIL HFA) 108 (90 Base) MCG/ACT inhaler Inhale 2 puffs into the lungs every 6 (six) hours as needed for wheezing. 09/12/20   Azzie Glatter, FNP  aspirin EC 81 MG tablet Take 81 mg by mouth daily.    [provider]  atorvastatin (LIPITOR) 80 MG tablet Take 1 tablet (80 mg total)  by mouth daily. 09/27/20 10/27/20  Cheryln Manly, NP  cyclobenzaprine (FLEXERIL) 10 MG tablet Take 1 tablet (10 mg total) by mouth 2 (two) times daily as needed for muscle spasms. 12/25/19   Azzie Glatter, FNP  gabapentin (NEURONTIN) 100 MG capsule Take 1 capsule (100 mg total) by mouth 3 (three) times daily. 08/21/20   Azzie Glatter, FNP  glipiZIDE (GLUCOTROL) 10 MG tablet TAKE 1 TABLET BY MOUTH TWICE DAILY BEFORE A MEAL Patient taking differently: Take 10 mg by mouth 2 (two) times daily before a meal. 12/25/19   Azzie Glatter, FNP  insulin glargine (LANTUS SOLOSTAR) 100 UNIT/ML Solostar Pen Inject 35 Units into the skin daily. 12/25/19   Azzie Glatter, FNP  Insulin Pen Needle 29G X 5MM MISC Use with insulin 08/21/20   Azzie Glatter, FNP  lisinopril (ZESTRIL) 10 MG tablet Take 1 tablet (10 mg total) by mouth daily. 12/25/19 12/24/20  Azzie Glatter, FNP  metoprolol tartrate (LOPRESSOR) 50 MG tablet Take 1 tablet (50 mg total) by mouth daily. 09/27/20 10/27/20  Cheryln Manly, NP  nitroGLYCERIN (NITROSTAT) 0.4 MG SL tablet Place 1 tablet (0.4 mg total) under the tongue every 5 (five) minutes as needed. 09/27/20   Cheryln Manly, NP    Critical care time: [address] minutes   Lestine Mount, [place] [facility] 10/02/20 8:06 AM  Please see [email or web address] for pager details.

## 2020-10-02 NOTE — ED Notes (Signed)
Dr. [place] notified of troponin results

## 2020-10-02 NOTE — Consult Note (Addendum)
Cardiology Consultation:   Patient ID: Tiffany Le MRN: [id number]; DOB: 1975/12/07  Admit date: 10/02/2020 Date of Consult: 10/02/2020  PCP:  Azzie Glatter, [place]  Cardiologist:  Quay Burow, MD  Advanced Practice Provider:  No care team member to display Electrophysiologist:  None   Patient Profile:   Tiffany Le is a 45 y.o. female with a hx of hypertension, hyperlipidemia, diabetes and coronary artery disease/NSTEMI who is being seen today for the evaluation of respiratory distress in the setting of severe coronary disease at the request of Dr. Sedonia Small.  History of Present Illness:   Tiffany Le is a 45 year old female with past medical history noted above.  She was recently admitted 1 week prior with episodes of progressive shortness of breath and exertional dyspnea.  Her troponin rose to 1390.  She underwent cardiac catheterization on 09/25/2020 with severe multivessel CAD including mid LAD 95% lesion followed by 70%, bifurcation high OM1 95 percent, bifurcation left circumflex OM 265 to 70%, and small caliber codominant RCA with 85% mid stenosis.  It was recommended that she undergo surgical revascularization.  She was seen by Dr. Orvan Seen and offered CABG the following day but the patient adamantly refused.  Was also seen by Dr. Gwenlyn Found and offered high risk PCI but again adamantly refused this procedure.  Her EF was noted at 35 to 40% with segmental wall motion abnormalities.  She ultimately left AGAINST MEDICAL ADVICE.  Discharged on aspirin, statin, beta-blocker.  Decision was made to not place on Plavix in the event that she followed up and decided to proceed with CABG.  Interval history obtained from chart and RN at bedside.  Patient was brought in this morning by EMS for respiratory distress.  Reportedly experienced acute onset of shortness of breath and on EMS arrival at her job she was noted to be increasingly somnolent with sats in the  50s despite being placed on BiPAP.  She was ultimately intubated in the ER with improvement in sats.  Labs on admission noted ABG: pH 7.261, CO2 48, pO2 57, lactic acid 6.3, potassium 3.3, creatinine 0.96, high-sensitivity troponin 94, WBC 11.6, hemoglobin 11.3.  EKG showed sinus tachycardia with ST elevation in aVR, V1 through V4, with ST depression in lead I, lead II and lead III.  Chest x-ray showed interval development of consolidation in both lungs.  She was admitted to [facility] for further management.  Cardiology asked to consult.   Past Medical History:  Diagnosis Date  . Coronary artery disease   . Diabetes mellitus without complication ([place])   . Diabetic neuropathy ([place]) 02/2020  . Hyperlipidemia age 45  . Hypertension age 89  . Hypocalcemia 11/2019  . Hypokalemia 11/2019  . Iron deficiency anemia   . Obesity   . Proteinuria 12/09/2019    Past Surgical History:  Procedure Laterality Date  . CARDIAC CATHETERIZATION  09/25/2020  . CESAREAN SECTION  3/96  . LEFT HEART CATH AND CORONARY ANGIOGRAPHY N/A 09/25/2020   Procedure: LEFT HEART CATH AND CORONARY ANGIOGRAPHY;  Surgeon: Leonie Man, MD;  Location: [place] [facility];  Service: Cardiovascular;  Laterality: N/A;  . ORIF FEMUR FRACTURE       Home Medications:  Prior to Admission medications   Medication Sig Start Date End Date Taking? Authorizing Provider  albuterol (PROVENTIL HFA) 108 (90 Base) MCG/ACT inhaler Inhale 2 puffs into the lungs every 6 (six) hours as needed for wheezing. 09/12/20   Kathe Becton  M, FNP  aspirin EC 81 MG tablet Take 81 mg by mouth daily.    [provider]  atorvastatin (LIPITOR) 80 MG tablet Take 1 tablet (80 mg total) by mouth daily. 09/27/20 10/27/20  Cheryln Manly, NP  cyclobenzaprine (FLEXERIL) 10 MG tablet Take 1 tablet (10 mg total) by mouth 2 (two) times daily as needed for muscle spasms. 12/25/19   Azzie Glatter, FNP  gabapentin (NEURONTIN) 100 MG capsule Take 1 capsule  (100 mg total) by mouth 3 (three) times daily. 08/21/20   Azzie Glatter, FNP  glipiZIDE (GLUCOTROL) 10 MG tablet TAKE 1 TABLET BY MOUTH TWICE DAILY BEFORE A MEAL Patient taking differently: Take 10 mg by mouth 2 (two) times daily before a meal. 12/25/19   Azzie Glatter, FNP  insulin glargine (LANTUS SOLOSTAR) 100 UNIT/ML Solostar Pen Inject 35 Units into the skin daily. 12/25/19   Azzie Glatter, FNP  Insulin Pen Needle 29G X 5MM MISC Use with insulin 08/21/20   Azzie Glatter, FNP  lisinopril (ZESTRIL) 10 MG tablet Take 1 tablet (10 mg total) by mouth daily. 12/25/19 12/24/20  Azzie Glatter, FNP  metoprolol tartrate (LOPRESSOR) 50 MG tablet Take 1 tablet (50 mg total) by mouth daily. 09/27/20 10/27/20  Cheryln Manly, NP  nitroGLYCERIN (NITROSTAT) 0.4 MG SL tablet Place 1 tablet (0.4 mg total) under the tongue every 5 (five) minutes as needed. 09/27/20   Cheryln Manly, NP    Inpatient Medications: Scheduled Meds: . aspirin  81 mg Per Tube Daily  . atorvastatin  80 mg Per Tube Daily  . docusate  100 mg Per Tube BID  . insulin aspart  0-20 Units Subcutaneous Q4H  . metoprolol tartrate  5 mg Intravenous Once  . metoprolol tartrate  12.5 mg Per Tube BID  . pantoprazole (PROTONIX) IV  40 mg Intravenous QHS  . polyethylene glycol  17 g Per Tube Daily  . potassium chloride  40 mEq Per Tube Daily   Continuous Infusions: . ceFEPime (MAXIPIME) IV    . propofol (DIPRIVAN) infusion 20 mcg/kg/min (10/02/20 7253)  . vancomycin    . [START ON 10/03/2020] vancomycin     PRN Meds: docusate sodium, fentaNYL (SUBLIMAZE) injection, fentaNYL (SUBLIMAZE) injection, polyethylene glycol  Allergies:    Allergies  Allergen Reactions  . Penicillins Rash    Historical Has patient had a PCN reaction causing immediate rash, facial/tongue/throat swelling, SOB or lightheadedness with hypotension: No Has patient had a PCN reaction causing severe rash involving mucus membranes or skin necrosis:  No Has patient had a PCN reaction that required hospitalization: No Has patient had a PCN reaction occurring within the last 10 years: No If all of the above answers are "NO", then may proceed with Cephalosporin use.    Social History:   Social History   Socioeconomic History  . Marital status: Single    Spouse name: Not on file  . Number of children: Not on file  . Years of education: Not on file  . Highest education level: Not on file  Occupational History  . Occupation: Event organiser  Tobacco Use  . Smoking status: Never Smoker  . Smokeless tobacco: Never Used  Vaping Use  . Vaping Use: Never used  Substance and Sexual Activity  . Alcohol use: No  . Drug use: No  . Sexual activity: Not Currently    Partners: Male    Comment: husband currently in [place], returning 02/2013  Other Topics Concern  .  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  Lives with 69 year old daughter, 1 cat Husband is living in [place] (citizen there), trying to come to [place] (has been delayed)   Social Determinants of Radio broadcast assistant Strain: Not on file  Food Insecurity: Not on file  Transportation Needs: Not on file  Physical Activity: Not on file  Stress: Not on file  Social Connections: Not on file  Intimate Partner Violence: Not on file    Family History:    Family History  Problem Relation Age of Onset  . Heart disease Mother        CABG <50  . Hypertension Mother   . Diabetes Mother   . Hyperlipidemia Mother   . Heart disease Father   . Hyperlipidemia Father   . Hypertension Father   . Early death Father   . Healthy Daughter   . Diabetes Maternal Aunt   . Cancer Maternal Grandmother        stomach cancer  . Diabetes Maternal Aunt   . Cancer Cousin        colon cancer (dx'd 64)     ROS:  Please see the history of present illness.   All other ROS reviewed and negative.     Physical Exam/Data:    Vitals:   10/02/20 0600 10/02/20 0615 10/02/20 0630 10/02/20 0808  BP: (!) 95/52 104/66 101/68   Pulse: (!) 116 (!) 114 (!) 114 100  Resp: (!) 24 (!) 24 (!) 24 (!) 24  Temp:      TempSrc:      SpO2: (!) 89% (!) 89% 91% 100%   No intake or output data in the 24 hours ending 10/02/20 0834 Last 3 Weights 09/27/2020 09/26/2020 09/25/2020  Weight (lbs) 244 lb 14.4 oz 243 lb 6.4 oz 240 lb 4.8 oz  Weight (kg) 111.086 kg 110.406 kg 109 kg     There is no height or weight on file to calculate BMI.  General: Young African-American female intubated, sedated HEENT: ETT in place Lymph: no adenopathy Neck: + JVD Endocrine:  No thryomegaly Vascular: No carotid bruits Cardiac:  normal S1, S2; RRR; no murmur  Lungs: Diminished, coarse breath sounds bilateral Abd: soft, nontender, no hepatomegaly  Ext: 1+ lower extremity edema bilaterally Musculoskeletal:  No deformities, BUE and BLE strength normal and equal Skin: warm and dry  Neuro: Intubated, sedated  EKG:  The EKG was personally reviewed and demonstrates:  sinus tachycardia with ST elevation in aVR, V1 through V4, with ST depression in lead I, lead II and lead III.  Relevant CV Studies:  Cath: 09/25/20   Prox LAD to Mid LAD lesion is 95% stenosed. Mid LAD is 70% stenosed  Dist LAD lesion is 50% stenosed.  Ramus-1 lesion is 95% stenosed with 70% stenosed side branch in Lat Ramus. Beyond the bifurcation, Ramus-2 lesion is 60% stenosis  Prox Cx to Dist Cx lesion is 65% stenosed with 70% stenosed side branch in 1st Mrg.  LPAV lesion is 70% stenosed.  Prox RCA-1 lesion is 60% stenosed.  Prox RCA-2 lesion is 85% stenosed.  There is mild to moderate left ventricular systolic dysfunction. EF estimated 40 to 56%  LV end diastolic pressure is severely elevated. 33 mmHg  There is no aortic valve stenosis.   SUMMARY  Severe multivessel CAD: Early mid LAD 95% followed by 70%, bifurcation high OM1 95%, bifurcation  LCx-OM2 (65-70%),  small caliber codominant RCA with 85% mid stenosis  ACUTE COMBINED SYSTOLIC AND DIASTOLIC HEART FAILURE ? mild to moderate reduced EF of roughly 40 to 45% with apical akinesis as well as anterior wall hypokinesis. ? severely elevated LVEDP of 33 mmHg   RECOMMENDATIONS  Restart IV heparin 8 hours after sheath removal.  CVTS consultation given diabetes and multivessel CAD.  PCI options would be LAD stenting of both lesions and then stenting of the high OM/ramus across the sidebranch with provisional PTCA; would treat the LCx and midRCA medically  Glenetta Hew, MD  Diagnostic Dominance: Co-dominant   Echo: 09/26/20  IMPRESSIONS    1. Left ventricular ejection fraction, by estimation, is 35 to 40%. The  left ventricle has moderately decreased function. The left ventricle  demonstrates regional wall motion abnormalities (see scoring  diagram/findings for description). The left  ventricular internal cavity size was mildly dilated. There is mild left  ventricular hypertrophy. Left ventricular diastolic parameters are  indeterminate.  2. Right ventricular systolic function is normal. The right ventricular  size is normal. There is mildly elevated pulmonary artery systolic  pressure. The estimated right ventricular systolic pressure is 25.3 mmHg.  3. The mitral valve is normal in structure. Trivial mitral valve  regurgitation.  4. The aortic valve is tricuspid. Aortic valve regurgitation is not  visualized. Mild aortic valve sclerosis is present, with no evidence of  aortic valve stenosis.  5. The inferior vena cava is normal in size with <50% respiratory  variability, suggesting right atrial pressure of 8 mmHg.   Laboratory Data:  High Sensitivity Troponin:   Recent Labs  Lab 09/25/20 0126 09/25/20 0418 10/02/20 0516  TROPONINIHS 1,390* 926* 94*     Chemistry Recent Labs  Lab 09/27/20 0221 10/02/20 0516 10/02/20 0535 10/02/20 0554  NA 135 138 141 139  K  3.6 3.3* 3.4* 3.1*  CL 101 104  --   --   CO2 23 18*  --   --   GLUCOSE 122* 307*  --   --   BUN 20 13  --   --   CREATININE 0.80 0.96  --   --   CALCIUM 8.6* 8.3*  --   --   GFRNONAA >60 >60  --   --   ANIONGAP 11 16*  --   --     Recent Labs  Lab 10/02/20 0516  PROT 7.3  ALBUMIN 2.9*  AST 27  ALT 26  ALKPHOS 64  BILITOT 0.4   Hematology Recent Labs  Lab 09/26/20 0253 09/27/20 0221 10/02/20 0516 10/02/20 0535 10/02/20 0554  WBC 5.8 7.8 11.6*  --   --   RBC 3.72* 4.04 4.18  --   --   HGB 10.3* 10.8* 11.3* 12.6 10.9*  HCT 31.4* 34.8* 38.3 37.0 32.0*  MCV 84.4 86.1 91.6  --   --   MCH 27.7 26.7 27.0  --   --   MCHC 32.8 31.0 29.5*  --   --   RDW 13.4 13.2 14.1  --   --   PLT 296 326 356  --   --    BNPNo results for input(s): BNP, PROBNP in the last 168 hours.  DDimer No results for input(s): DDIMER in the last 168 hours.   Radiology/Studies:  DG Chest Portable 1 View  Result Date: 10/02/2020 CLINICAL DATA:  ET and OG tube placement EXAM: PORTABLE CHEST 1 VIEW COMPARISON:  Radiograph 09/24/2020, CT 09/25/2020 FINDINGS: Endotracheal tube tip terminates  in the mid to lower trachea, 2 cm from the carina. A transesophageal tube tip and side port distal to the GE junction. There is been interval development of dense consolidative opacity within both lungs, most pronounced in throughout the right lung and in the left mid lung. Obscuration of the left hemidiaphragm could reflect some layering pleural fluid as well. No visible right effusion. No pneumothorax. IMPRESSION: 1. Endotracheal tube tip terminates in the mid to lower trachea, 2 cm from the carina. 2. A transesophageal tube tip and side port distal to the GE junction. 3. Interval development of dense consolidative opacity within both lungs, most pronounced in the right lung and in the left mid lung. 4. Possible layering left pleural fluid. Electronically Signed   By: Lovena Le M.D.   On: 10/02/2020 05:12      Assessment and Plan:   Tiffany Le is a 45 y.o. female with a hx of hypertension, hyperlipidemia, diabetes and coronary artery disease/NSTEMI who is being seen today for the evaluation of respiratory distress in the setting of severe coronary disease at the request of Dr. Sedonia Small.  1.  Acute hypoxic respiratory failure: Felt to be in the setting of acute pulmonary edema with underlying ischemic cardiomyopathy/multivessel CAD.  Chest x-ray does have concerning findings for possible HCAP.  Currently intubated, does have some evidence of volume overload on exam.  Was given 40 mg IV Lasix x1 in the ER --Blood pressures are soft with the need for IV sedation --Can hold on additional diuresis for now and follow clinical course --Antibiotics per primary  2.  Severe multivessel CAD/recent NSTEMI: Underwent cardiac cath on 2/15 with recommendations to undergo CABG.  She was evaluated by Dr. Orvan Seen and offered revascularization but adamantly refused ultimately being discharged [place]. --For now we will need to treat medically, aspirin, statin and beta-blocker as blood pressures allow --IV heparin --Pending neurological status will need to revisit surgical interventions this admission  3.  Ischemic cardiomyopathy: EF on recent echo noted at 35 to 40%.  She was discharged on beta-blocker therapy.  Currently on IV sedation and blood pressures are borderline soft.   --Would add beta-blocker as blood pressures tolerate  4.  Hypokalemia: Supplement  5.  Diabetes: Holding home glipizide and Lantus --Hemoglobin A1c 7.8 --Sliding scale insulin  6.  Hyperlipidemia: Most recent LDL 91 --Continue atorvastatin 80 mg daily  For questions or updates, please contact [place] Please consult [email or web address] for contact info under    Signed, Reino Bellis, NP  10/02/2020 8:34 AM

## 2020-10-02 NOTE — ED Notes (Signed)
Pt vomiting around OG- OG removed, large amount of undigested food and vomitus suctioned -- MD made aware- mother at bedside

## 2020-10-02 NOTE — Progress Notes (Signed)
RT attempted to preform recruitment maneuver to help with patient's oxygen saturations. Recruitment maneuver was unsuccessful.

## 2020-10-02 NOTE — Progress Notes (Signed)
Pt initially placed on bipap with setting as follows- 21/10 100% backup rate of 15, sats continued to drop and WOB increased, subsequently needing intubation.

## 2020-10-02 NOTE — ED Notes (Signed)
Mother - Stacie Acres -- [phone] updated on pt status-- will come and see pt.

## 2020-10-02 NOTE — Progress Notes (Signed)
Pharmacy Antibiotic Note  Tiffany Le is a 45 y.o. female admitted on 10/02/2020 with possible PNA.  Pharmacy has been consulted for vancomycin/cefepime dosing. SCr 0.96 on admit - up slightly from previous.  Plan: Cefepime 2g IV q8h Vancomycin 2250mg  IV x 1; then 1500mg  IV q24h. Goal AUC 400-550. Expected AUC: 446 SCr used: 0.96 Monitor clinical progress, c/s, renal function F/u de-escalation plan/LOT, vancomycin levels as indicated      Temp (24hrs), Avg:98.9 F (37.2 C), Min:98.9 F (37.2 C), Max:98.9 F (37.2 C)  Recent Labs  Lab 09/26/20 0253 09/27/20 0221 10/02/20 0516  WBC 5.8 7.8 11.6*  CREATININE  --  0.80 0.96  LATICACIDVEN  --   --  6.3*    Estimated Creatinine Clearance: 91.3 mL/min (by C-G formula based on SCr of 0.96 mg/dL).    Allergies  Allergen Reactions  . Penicillins Rash    Historical Has patient had a PCN reaction causing immediate rash, facial/tongue/throat swelling, SOB or lightheadedness with hypotension: No Has patient had a PCN reaction causing severe rash involving mucus membranes or skin necrosis: No Has patient had a PCN reaction that required hospitalization: No Has patient had a PCN reaction occurring within the last 10 years: No If all of the above answers are "NO", then may proceed with Cephalosporin use.    Antimicrobials this admission: 2/22 vancomycin >>  2/22 cefepime >>   Dose adjustments this admission:   Microbiology results:   Arturo Morton, PharmD, BCPS Please check AMION for all [place] contact numbers Clinical Pharmacist 10/02/2020 8:01 AM

## 2020-10-02 NOTE — Consult Note (Addendum)
Advanced Heart Failure Team Consult Note   Primary Physician: Azzie Glatter, FNP PCP-Cardiologist:  Quay Burow, MD  Reason for Consultation: Heart Failure Optimization prior to CABG   HPI:    Tiffany Le is seen today for heart failure optimization prior to CABG at the request of Dr. Orvan Seen, [place] [facility].   45 y/o AA female, nonsmoker, w/ FH of CAD (mother and father), poorly controlled T2DM, HTN, HLD, obesity, asthma and newly diagnosed CAD and systolic heart failure due to ischemic CM.   She was recently hospitalized from 2/14-2/17 for NSTEMI. Had presented w/ several week h/o progressive exertional CP and dyspnea. Ruled in for NSTEMI w/ Hs Trop peaking at 1,390. Echo showed moderately reduced LVEF at 35-40%. RV normal.  LHC showed severe multivessel CAD w/ mid LAD 95% followed by 70%, bifurcation high OM1 95%, bifurcation LCx-OM2 (65-70%), small caliber codominant RCA with 85% mid stenosis. LVEDP was elevated on cath at 33 mmHg. Her CAD was felt not amendable for PCI. Was evaluated by CT surgery and CABG was recommended however pt left AMA on 12/17.  Earlier today, she presented to ED by EMS w/ acute hypoxic respiratory failure 2/2 pulmonary edema and ? PNA.  Intubated in the ED. Had a run of VT and placed on IV amiodarone. K 3.1. Readmitted to CCU. Given IV Lasix 40 mg x 1. Repeat limited echo shows no significant interval change. LVEF remains 35-40%. RV not well visualized. No MR or ASD.   She is now tentatively scheduled for CABG on 2/25. AHF team consulted to help w/ HF optimization prior to OR.    Pertinent Labs  COVID negative Lactic Acid 6.3>>4.1  Hs trop 94>>673 BNP 947  PCT 1.47  Blood and respiratory cultures pending WBC 11.6K  Hgb 11.3  Na 138 K 3.3 CO2 18  SCr 0.96 AST 27 ALT 26    LHC 09/25/20  Prox LAD to Mid LAD lesion is 95% stenosed. Mid LAD is 70% stenosed  Dist LAD lesion is 50% stenosed.  Ramus-1 lesion is 95% stenosed with 70%  stenosed side branch in Lat Ramus. Beyond the bifurcation, Ramus-2 lesion is 60% stenosis  Prox Cx to Dist Cx lesion is 65% stenosed with 70% stenosed side branch in 1st Mrg.  LPAV lesion is 70% stenosed.  Prox RCA-1 lesion is 60% stenosed.  Prox RCA-2 lesion is 85% stenosed.  There is mild to moderate left ventricular systolic dysfunction. EF estimated 40 to 99%  LV end diastolic pressure is severely elevated. 33 mmHg  There is no aortic valve stenosis.  Diagnostic Dominance: Co-dominant   Echo 09/26/20- EF 35-40%. RV normal   Limited Echo 10/02/20 (today): EF 35-40%. RV no well visualized. No MR or ASD     Review of Systems: [y] = yes, [ ]  = no   . General: Weight gain [ ] ; Weight loss [ ] ; Anorexia [ ] ; Fatigue [ ] ; Fever [ ] ; Chills [ ] ; Weakness [ ]   . Cardiac: Chest pain/pressure [ ] ; Resting SOB [Y]; Exertional SOB [Y ]; Orthopnea [ ] ; Pedal Edema [ ] ; Palpitations [ ] ; Syncope [ ] ; Presyncope [ ] ; Paroxysmal nocturnal dyspnea[ ]   . Pulmonary: Cough [ ] ; Wheezing[ ] ; Hemoptysis[ ] ; Sputum [ ] ; Snoring [ ]   . GI: Vomiting[ ] ; Dysphagia[ ] ; Melena[ ] ; Hematochezia [ ] ; Heartburn[ ] ; Abdominal pain [ ] ; Constipation [ ] ; Diarrhea [ ] ; BRBPR [ ]   . GU: Hematuria[ ] ; Dysuria [ ] ; Nocturia[ ]   .  Vascular: Pain in legs with walking [ ] ; Pain in feet with lying flat [ ] ; Non-healing sores [ ] ; Stroke [ ] ; TIA [ ] ; Slurred speech [ ] ;  . Neuro: Headaches[ ] ; Vertigo[ ] ; Seizures[ ] ; Paresthesias[ ] ;Blurred vision [ ] ; Diplopia [ ] ; Vision changes [ ]   . Ortho/Skin: Arthritis [ ] ; Joint pain [ ] ; Muscle pain [ ] ; Joint swelling [ ] ; Back Pain [ ] ; Rash [ ]   . Psych: Depression[ ] ; Anxiety[ ]   . Heme: Bleeding problems [ ] ; Clotting disorders [ ] ; Anemia [ ]   . Endocrine: Diabetes [ Y]; Thyroid dysfunction[ ]   Home Medications Prior to Admission medications   Medication Sig Start Date End Date Taking? Authorizing Provider  albuterol (PROVENTIL HFA) 108 (90 Base) MCG/ACT  inhaler Inhale 2 puffs into the lungs every 6 (six) hours as needed for wheezing. 09/12/20  Yes Azzie Glatter, FNP  amLODipine (NORVASC) 10 MG tablet Take 10 mg by mouth daily.   Yes [provider]  aspirin EC 81 MG tablet Take 81 mg by mouth daily.   Yes [provider]  atorvastatin (LIPITOR) 80 MG tablet Take 1 tablet (80 mg total) by mouth daily. 09/27/20 10/27/20 Yes Cheryln Manly, NP  cyclobenzaprine (FLEXERIL) 10 MG tablet Take 1 tablet (10 mg total) by mouth 2 (two) times daily as needed for muscle spasms. 12/25/19  Yes Azzie Glatter, FNP  gabapentin (NEURONTIN) 100 MG capsule Take 1 capsule (100 mg total) by mouth 3 (three) times daily. 08/21/20  Yes Azzie Glatter, FNP  glipiZIDE (GLUCOTROL) 10 MG tablet TAKE 1 TABLET BY MOUTH TWICE DAILY BEFORE A MEAL Patient taking differently: Take 10 mg by mouth 2 (two) times daily before a meal. 12/25/19  Yes Azzie Glatter, FNP  hydrochlorothiazide (HYDRODIURIL) 25 MG tablet Take 25 mg by mouth daily.   Yes [provider]  insulin glargine (LANTUS SOLOSTAR) 100 UNIT/ML Solostar Pen Inject 35 Units into the skin daily. 12/25/19  Yes Azzie Glatter, FNP  lisinopril (ZESTRIL) 10 MG tablet Take 1 tablet (10 mg total) by mouth daily. 12/25/19 12/24/20 Yes Azzie Glatter, FNP  metoprolol tartrate (LOPRESSOR) 50 MG tablet Take 1 tablet (50 mg total) by mouth daily. 09/27/20 10/27/20 Yes Cheryln Manly, NP  nitroGLYCERIN (NITROSTAT) 0.4 MG SL tablet Place 1 tablet (0.4 mg total) under the tongue every 5 (five) minutes as needed. Patient taking differently: Place 0.4 mg under the tongue every 5 (five) minutes as needed for chest pain (max 3 doses. Call 911 after taking the 2nd doses without resolved). 09/27/20  Yes Cheryln Manly, NP  Insulin Pen Needle 29G X 5MM MISC Use with insulin 08/21/20   Azzie Glatter, FNP    Past Medical History: Past Medical History:  Diagnosis Date  . Coronary artery disease    . Diabetes mellitus without complication ([place])   . Diabetic neuropathy ([place]) 02/2020  . Hyperlipidemia age 65  . Hypertension age 16  . Hypocalcemia 11/2019  . Hypokalemia 11/2019  . Iron deficiency anemia   . Obesity   . Proteinuria 12/09/2019    Past Surgical History: Past Surgical History:  Procedure Laterality Date  . CARDIAC CATHETERIZATION  09/25/2020  . CESAREAN SECTION  3/96  . LEFT HEART CATH AND CORONARY ANGIOGRAPHY N/A 09/25/2020   Procedure: LEFT HEART CATH AND CORONARY ANGIOGRAPHY;  Surgeon: Leonie Man, MD;  Location: [facility];  Service: Cardiovascular;  Laterality: N/A;  . ORIF FEMUR  FRACTURE      Family History: Family History  Problem Relation Age of Onset  . Heart disease Mother        CABG <50  . Hypertension Mother   . Diabetes Mother   . Hyperlipidemia Mother   . Heart disease Father   . Hyperlipidemia Father   . Hypertension Father   . Early death Father   . Healthy Daughter   . Diabetes Maternal Aunt   . Cancer Maternal Grandmother        stomach cancer  . Diabetes Maternal Aunt   . Cancer Cousin        colon cancer (dx'd 32)    Social History: Social History   Socioeconomic History  . Marital status: Single    Spouse name: Not on file  . Number of children: Not on file  . Years of education: Not on file  . Highest education level: Not on file  Occupational History  . Occupation: Event organiser  Tobacco Use  . Smoking status: Never Smoker  . Smokeless tobacco: Never Used  Vaping Use  . Vaping Use: Never used  Substance and Sexual Activity  . Alcohol use: No  . Drug use: No  . Sexual activity: Not Currently    Partners: Male    Comment: husband currently in [place], returning 02/2013  Other Topics Concern  . 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  Lives with 71 year old daughter, 1 cat Husband is living in [place] (citizen there), trying to come  to [place] (has been delayed)   Social Determinants of Radio broadcast assistant Strain: Not on file  Food Insecurity: Not on file  Transportation Needs: Not on file  Physical Activity: Not on file  Stress: Not on file  Social Connections: Not on file    Allergies:  Allergies  Allergen Reactions  . Penicillins Rash    Historical Has patient had a PCN reaction causing immediate rash, facial/tongue/throat swelling, SOB or lightheadedness with hypotension: No Has patient had a PCN reaction causing severe rash involving mucus membranes or skin necrosis: No Has patient had a PCN reaction that required hospitalization: No Has patient had a PCN reaction occurring within the last 10 years: No If all of the above answers are "NO", then may proceed with Cephalosporin use.    Objective:    Vital Signs:   Temp:  [98.9 F (37.2 C)-99.3 F (37.4 C)] 99.3 F (37.4 C) (02/22 1200) Pulse Rate:  [97-143] 117 (02/22 1245) Resp:  [12-32] 29 (02/22 1245) BP: (95-141)/(52-105) 140/101 (02/22 1230) SpO2:  [78 %-100 %] 97 % (02/22 1245) FiO2 (%):  [60 %-100 %] 60 % (02/22 1200) Last BM Date:  (pta)  Weight change: There were no vitals filed for this visit.  Intake/Output:  No intake or output data in the 24 hours ending 10/02/20 1255    Physical Exam    General:  Intubated and sedated.  HEENT: + ETT normal Neck: supple. JVP elevated to ear . Carotids 2+ bilat; no bruits. No lymphadenopathy or thyromegaly appreciated. Cor: PMI nondisplaced. Regular rhythm, tachy rate. No rubs, gallops or murmurs. Lungs: intubated, clear Abdomen: soft, nontender, nondistended. No hepatosplenomegaly. No bruits or masses. Good bowel sounds. Extremities: no cyanosis, clubbing, rash, 1-2+ bilateral pretibial edema, distal extremities are cool to touch  Neuro: intubated and sedated   Telemetry   Sinus tach 110s  EKG    Sinus tach w/ slight ST elevation in V1-V3 compared to prior   Labs   Basic  Metabolic Panel: Recent Labs  Lab 09/27/20 0221 10/02/20 0516 10/02/20 0535 10/02/20 0554  NA 135 138 141 139  K 3.6 3.3* 3.4* 3.1*  CL 101 104  --   --   CO2 23 18*  --   --   GLUCOSE 122* 307*  --   --   BUN 20 13  --   --   CREATININE 0.80 0.96  --   --   CALCIUM 8.6* 8.3*  --   --     Liver Function Tests: Recent Labs  Lab 10/02/20 0516  AST 27  ALT 26  ALKPHOS 64  BILITOT 0.4  PROT 7.3  ALBUMIN 2.9*   No results for input(s): LIPASE, AMYLASE in the last 168 hours. No results for input(s): AMMONIA in the last 168 hours.  CBC: Recent Labs  Lab 09/26/20 0253 09/27/20 0221 10/02/20 0516 10/02/20 0535 10/02/20 0554  WBC 5.8 7.8 11.6*  --   --   HGB 10.3* 10.8* 11.3* 12.6 10.9*  HCT 31.4* 34.8* 38.3 37.0 32.0*  MCV 84.4 86.1 91.6  --   --   PLT 296 326 356  --   --     Cardiac Enzymes: No results for input(s): CKTOTAL, CKMB, CKMBINDEX, TROPONINI in the last 168 hours.  BNP: BNP (last 3 results) Recent Labs    10/02/20 0516  BNP 947.1*    ProBNP (last 3 results) No results for input(s): PROBNP in the last 8760 hours.   CBG: Recent Labs  Lab 09/26/20 1606 09/26/20 2100 09/27/20 0604 09/27/20 1113 10/02/20 1245  GLUCAP 124* 170* 153* 187* 165*    Coagulation Studies: Recent Labs    10/02/20 0516  LABPROT 13.4  INR 1.1     Imaging   DG Chest Portable 1 View  Result Date: 10/02/2020 CLINICAL DATA:  ET and OG tube placement EXAM: PORTABLE CHEST 1 VIEW COMPARISON:  Radiograph 09/24/2020, CT 09/25/2020 FINDINGS: Endotracheal tube tip terminates in the mid to lower trachea, 2 cm from the carina. A transesophageal tube tip and side port distal to the GE junction. There is been interval development of dense consolidative opacity within both lungs, most pronounced in throughout the right lung and in the left mid lung. Obscuration of the left hemidiaphragm could reflect some layering pleural fluid as well. No visible right effusion. No  pneumothorax. IMPRESSION: 1. Endotracheal tube tip terminates in the mid to lower trachea, 2 cm from the carina. 2. A transesophageal tube tip and side port distal to the GE junction. 3. Interval development of dense consolidative opacity within both lungs, most pronounced in the right lung and in the left mid lung. 4. Possible layering left pleural fluid. Electronically Signed   By: Lovena Le M.D.   On: 10/02/2020 05:12      Medications:     Current Medications: . amiodarone  150 mg Intravenous Once  . aspirin  81 mg Per Tube Daily  . atorvastatin  80 mg Per Tube Daily  . docusate  100 mg Per Tube BID  . insulin aspart  0-20 Units Subcutaneous Q4H  . metoprolol tartrate  5 mg Intravenous Once  . metoprolol tartrate  12.5 mg Per Tube BID  . pantoprazole (PROTONIX) IV  40 mg Intravenous QHS  . perflutren lipid microspheres (DEFINITY) IV suspension      . polyethylene glycol  17 g  Per Tube Daily  . potassium chloride  40 mEq Per Tube Daily     Infusions: . amiodarone     Followed by  . amiodarone    . ceFEPime (MAXIPIME) IV 2 g (10/02/20 0954)  . heparin 1,400 Units/hr (10/02/20 0929)  . propofol (DIPRIVAN) infusion 20 mcg/kg/min (10/02/20 8850)  . vancomycin    . [START ON 10/03/2020] vancomycin         Assessment/Plan   1. Acute Hypoxic Respiratory Failure Requiring Intubation  - 2/2 acute pulmonary edema + likely PNA. PCT 1.47. WBC 12K - Diurese w/ IV Lasix - Abx per PCCM, currently on Vanc + cefepime - Blood and respiratory cultures pending  - Vent management per PCCM  - Follow lactic acid for clearance, 6.3>>4.1>>3.3   2. CAD: - Recent NSTEMI 2/14. Hs trop peaked to 1,390. Cath w/ severe MVCAD. Declined CABG initially after leaving AMA - Now readmitted w/ acute on chronic systolic heart failure w/ pulmonary edema  - EKG w/ slight ST elevation in V1-V3 compared to prior  - Hs trop 94>>673  - On heparin gtt - Continue to follow troponin trend - CABG now  scheduled for 2/25 - ASA 81 + atorvastain 80 per tube - Hold  blocker for now w/ potential low output   3. Acute on Chronic Systolic Heart Failure/ Ischemic Cardiomyopathy - Echo 09/26/20 LVEF 35-40%. RV ok - Severe MVCAD needing CABG - Repeat limited echo today w/ no interval change. EF 35-40%. No MR or ASD - Fluid overloaded on exam. Continue IV Lasix. Increase to 40 mg bid - Place PICC to follow CVP and check Co-ox. Concern for low output. May need inotropic support to optimize prior to OR if co-ox low - Add Spiro 12.5 mg  - Hold  blocker for now   4. VT:  - reported to have VT in the ED - continue amio gtt - supp K and Mg - Keep K > 4.0 and Mg > 2.0   5. Hypokalemia - K 3.1 on admit - IV KCl ordered - start spiro 12.5 mg   6. HLD w/ LDL Goal < 70 - recent LDL elevated at 91 mg/dL - continue atorvastatin 80 mg   7. Type 2DM - poorly controlled, Hgb A1c was 12 in April 2021 - most recent Hgb A1c down to 7.8  - insulin per PCCM    Length of Stay: 0  Lyda Jester, PA-C  10/02/2020, 12:55 PM  Advanced Heart Failure Team Pager [phone] (M-F; 7a - 4p)  Please contact [facility] for night-coverage after hours (4p -7a ) and weekends on [email or web address]  Patient seen with PA, agree with the above note.   See above history, patient presented last week with NSTEMI, had severe multivessel CAD and CABG recommended.  Echo with EF 35-40%.  Patient refused CABG and left AMA.    She developed progressive exertional dyspnea, becoming markedly severe this morning. She did not report chest pain. (history from her mother).  To ER, had pulmonary edema + possible PNA on CXR.  She ended up getting intubated.   Per report, there was VT in the ER though no strips available.  Amiodarone gtt was started.  Echo today showed stable EF 35-40%.  HS-TnI 94 => 673; lactate 6.3 => 3.3, procalcitonin 1.47.  Temp to 101.6 this afternoon, vancomycin/cefepime started.  She had 2 doses IV Lasix. ECG  with ST elevation across the precordium.   General: Intubated/sedated.  Neck: JVP 14 cm, no thyromegaly  or thyroid nodule.  Lungs: Clear to auscultation bilaterally with normal respiratory effort. CV: Nondisplaced PMI.  Heart regular S1/S2, no S3/S4, no murmur.  1+ ankle edema.  2+ PT pulses.  Abdomen: Soft, nontender, no hepatosplenomegaly, no distention.  Skin: Intact without lesions or rashes.  Neurologic:Sedated on vent.  Extremities: No clubbing or cyanosis.  HEENT: Normal.   1. CAD: NSTEMI last admission, cath showed multivessel CAD (has strong FH of premature CAD as well as DM and HTN).  CABG recommended but she left AMA.  She returned with marked dyspnea/pulmonary edema.  HS-TnI 94 => 673.  ST elevation across precordium. Echo unchanged with EF 35-40%.  Currently intubated and stable.  I discussed the patient with Dr. Martinique with interventional cardiology and viewed the cath films => she really does not have a good PCI option.  Will plan to stabilize and try to get her to CABG.  - Continue ASA 81.  - High dose statin.  - Heparin gtt.  - Hold Plavix for CABG.  - Dr. Orvan Seen following.  2. Acute systolic CHF: Echo (3/20) with EF 35-40%, mild LVH (similar this admission and last).  Elevated lactate in setting of hypoxemia prior to intubation.  CXR with pulmonary edema and possible PNA.  She is volume overloaded on exam.  - Lasix 80 mg IV bid for now.  Replace K.  - Spironolactone 12.5 daily.  - Repeat lactate.  - Place PICC to follow CVP and co-ox.  3. ID: Tm 101.6 with PCT 1.47.  Concern for possible aspiration PNA.  CXR with bilateral concerning infiltrates.  - Continue vancomcyin/cefepime.  - Cultures sent.  4. Acute hypoxemic respiratory failure: Intubated with pulmonary edema and possible PNA.  - Diuresis.  - Antibiotics.  5. Type 2 diabetes: Control improved recently.  6. VT: Noted in ER though strips not available.  - Continue amiodarone gtt.   Loralie Champagne 10/02/2020 4:39 PM

## 2020-10-02 NOTE — Progress Notes (Signed)
*  PRELIMINARY RESULTS* Echocardiogram 2D Echocardiogram LIMITED with Definity has been performed.  Leavy Cella 10/02/2020, 1:01 PM

## 2020-10-02 NOTE — Progress Notes (Signed)
[facility] for Heparin Indication: chest pain/ACS  Allergies  Allergen Reactions  . Penicillins Rash    Historical Has patient had a PCN reaction causing immediate rash, facial/tongue/throat swelling, SOB or lightheadedness with hypotension: No Has patient had a PCN reaction causing severe rash involving mucus membranes or skin necrosis: No Has patient had a PCN reaction that required hospitalization: No Has patient had a PCN reaction occurring within the last 10 years: No If all of the above answers are "NO", then may proceed with Cephalosporin use.    Patient Measurements:   Heparin Dosing Weight: 80.6 kg  Vital Signs: Temp: 98.9 F (37.2 C) (02/22 0550) Temp Source: Oral (02/22 0550) BP: 101/68 (02/22 0630) Pulse Rate: 100 (02/22 0808)  Labs: Recent Labs    10/02/20 0516 10/02/20 0535 10/02/20 0554  HGB 11.3* 12.6 10.9*  HCT 38.3 37.0 32.0*  PLT 356  --   --   APTT 23*  --   --   LABPROT 13.4  --   --   INR 1.1  --   --   CREATININE 0.96  --   --   TROPONINIHS 94*  --   --     Estimated Creatinine Clearance: 91.3 mL/min (by C-G formula based on SCr of 0.96 mg/dL).   Medical History: Past Medical History:  Diagnosis Date  . Coronary artery disease   . Diabetes mellitus without complication ([place])   . Diabetic neuropathy ([place]) 02/2020  . Hyperlipidemia age 55  . Hypertension age 25  . Hypocalcemia 11/2019  . Hypokalemia 11/2019  . Iron deficiency anemia   . Obesity   . Proteinuria 12/09/2019    Medications:  Infusions:  . ceFEPime (MAXIPIME) IV    . heparin    . propofol (DIPRIVAN) infusion 20 mcg/kg/min (10/02/20 4562)  . vancomycin    . [START ON 10/03/2020] vancomycin      Assessment: 45 y.o. female admitted with respiratory distress, intubated in the ED. Patient recently admitted for NSTEMI, found to have severe multivessel CAD, left AMA 09/27/20 after refusing CABG. Pt was also offered high risk PCI which  was refused as well. Pharmacy has been consulted to start heparin.   Patient was previously therapeutic on IV heparin 1400 units/hr. Hgb 11.3, plts wnl.   Goal of Therapy:  Heparin level 0.3-0.7 units/ml Monitor platelets by anticoagulation protocol: Yes   Plan:  Give 4000 units bolus x 1 Start heparin infusion at 1400 units/hr Heparin level in 6 hours Monitor daily heparin level, CBC, and s/s bleeding  Rebbeca Paul, PharmD PGY1 Pharmacy Resident 10/02/2020 8:41 AM  Please check [email or web address] for unit-specific pharmacy phone numbers.

## 2020-10-03 ENCOUNTER — Encounter (HOSPITAL_COMMUNITY): Payer: Self-pay | Admitting: Pulmonary Disease

## 2020-10-03 ENCOUNTER — Other Ambulatory Visit: Payer: Self-pay

## 2020-10-03 ENCOUNTER — Inpatient Hospital Stay (HOSPITAL_COMMUNITY): Payer: Medicaid Other

## 2020-10-03 DIAGNOSIS — Z01810 Encounter for preprocedural cardiovascular examination: Secondary | ICD-10-CM

## 2020-10-03 DIAGNOSIS — J9601 Acute respiratory failure with hypoxia: Secondary | ICD-10-CM

## 2020-10-03 LAB — BASIC METABOLIC PANEL
Anion gap: 13 (ref 5–15)
BUN: 17 mg/dL (ref 6–20)
CO2: 19 mmol/L — ABNORMAL LOW (ref 22–32)
Calcium: 7.4 mg/dL — ABNORMAL LOW (ref 8.9–10.3)
Chloride: 99 mmol/L (ref 98–111)
Creatinine, Ser: 0.95 mg/dL (ref 0.44–1.00)
GFR, Estimated: >60 mL/min (ref >60)
Glucose, Bld: 243 mg/dL — ABNORMAL HIGH (ref 70–99)
Potassium: 4.3 mmol/L (ref 3.5–5.1)
Sodium: 131 mmol/L — ABNORMAL LOW (ref 135–145)

## 2020-10-03 LAB — CBC
HCT: 25.5 % — ABNORMAL LOW (ref 36.0–46.0)
HCT: 26.8 % — ABNORMAL LOW (ref 36.0–46.0)
Hemoglobin: 9 g/dL — ABNORMAL LOW (ref 12.0–15.0)
Hemoglobin: 8.6 g/dL — ABNORMAL LOW (ref 12.0–15.0)
MCH: 30.3 pg (ref 26.0–34.0)
MCH: 27.7 pg (ref 26.0–34.0)
MCHC: 35.3 g/dL (ref 30.0–36.0)
MCHC: 32.1 g/dL (ref 30.0–36.0)
MCV: 85.9 fL (ref 80.0–100.0)
MCV: 86.5 fL (ref 80.0–100.0)
Platelets: 249 10*3/uL (ref 150–400)
Platelets: 251 10*3/uL (ref 150–400)
RBC: 2.97 MIL/uL — ABNORMAL LOW (ref 3.87–5.11)
RBC: 3.1 MIL/uL — ABNORMAL LOW (ref 3.87–5.11)
RDW: 14.2 % (ref 11.5–15.5)
RDW: 14.2 % (ref 11.5–15.5)
WBC: 11.6 10*3/uL — ABNORMAL HIGH (ref 4.0–10.5)
WBC: 11 10*3/uL — ABNORMAL HIGH (ref 4.0–10.5)
nRBC: 0 % (ref 0.0–0.2)
nRBC: 0 % (ref 0.0–0.2)

## 2020-10-03 LAB — POCT I-STAT 7, (LYTES, BLD GAS, ICA,H+H)
Acid-base deficit: 1 mmol/L (ref 0.0–2.0)
Bicarbonate: 21.7 mmol/L (ref 20.0–28.0)
Calcium, Ion: 1.12 mmol/L — ABNORMAL LOW (ref 1.15–1.40)
HCT: 34 % — ABNORMAL LOW (ref 36.0–46.0)
Hemoglobin: 11.6 g/dL — ABNORMAL LOW (ref 12.0–15.0)
O2 Saturation: 99 %
Patient temperature: 98
Potassium: 4 mmol/L (ref 3.5–5.1)
Sodium: 138 mmol/L (ref 135–145)
TCO2: 23 mmol/L (ref 22–32)
pCO2 arterial: 27.7 mmHg — ABNORMAL LOW (ref 32.0–48.0)
pH, Arterial: 7.501 — ABNORMAL HIGH (ref 7.350–7.450)
pO2, Arterial: 131 mmHg — ABNORMAL HIGH (ref 83.0–108.0)

## 2020-10-03 LAB — HEPARIN LEVEL (UNFRACTIONATED): Heparin Unfractionated: 0.4 IU/mL (ref 0.30–0.70)

## 2020-10-03 LAB — RENAL FUNCTION PANEL
Albumin: 2.3 g/dL — ABNORMAL LOW (ref 3.5–5.0)
Anion gap: 12 (ref 5–15)
BUN: 18 mg/dL (ref 6–20)
CO2: 21 mmol/L — ABNORMAL LOW (ref 22–32)
Calcium: 7.9 mg/dL — ABNORMAL LOW (ref 8.9–10.3)
Chloride: 105 mmol/L (ref 98–111)
Creatinine, Ser: 0.91 mg/dL (ref 0.44–1.00)
GFR, Estimated: >60 mL/min (ref >60)
Glucose, Bld: 223 mg/dL — ABNORMAL HIGH (ref 70–99)
Phosphorus: 3.4 mg/dL (ref 2.5–4.6)
Potassium: 3.7 mmol/L (ref 3.5–5.1)
Sodium: 138 mmol/L (ref 135–145)

## 2020-10-03 LAB — MAGNESIUM: Magnesium: 1.7 mg/dL (ref 1.7–2.4)

## 2020-10-03 LAB — GLUCOSE, CAPILLARY
Glucose-Capillary: 124 mg/dL — ABNORMAL HIGH (ref 70–99)
Glucose-Capillary: 125 mg/dL — ABNORMAL HIGH (ref 70–99)
Glucose-Capillary: 144 mg/dL — ABNORMAL HIGH (ref 70–99)
Glucose-Capillary: 127 mg/dL — ABNORMAL HIGH (ref 70–99)
Glucose-Capillary: 126 mg/dL — ABNORMAL HIGH (ref 70–99)
Glucose-Capillary: 98 mg/dL (ref 70–99)

## 2020-10-03 LAB — TRIGLYCERIDES
Triglycerides: 1841 mg/dL — ABNORMAL HIGH (ref <150)
Triglycerides: 57 mg/dL (ref <150)

## 2020-10-03 LAB — COOXEMETRY PANEL
Carboxyhemoglobin: 1 % (ref 0.5–1.5)
Methemoglobin: 1.5 % (ref 0.0–1.5)
O2 Saturation: 68 %
Total hemoglobin: 8.7 g/dL — ABNORMAL LOW (ref 12.0–16.0)

## 2020-10-03 LAB — PHOSPHORUS: Phosphorus: 3 mg/dL (ref 2.5–4.6)

## 2020-10-03 MED ORDER — DEXMEDETOMIDINE HCL IN NACL 400 MCG/100ML IV SOLN
0.2000 ug/kg/h | INTRAVENOUS | Status: DC
  Administered 2020-10-03: 0.4 ug/kg/h via INTRAVENOUS
  Administered 2020-10-04: 0.2 ug/kg/h via INTRAVENOUS
  Filled 2020-10-03 (×2): qty 100

## 2020-10-03 MED ORDER — MAGNESIUM SULFATE 2 GM/50ML IV SOLN
2.0000 g | Freq: Once | INTRAVENOUS | Status: AC
  Administered 2020-10-03: 2 g via INTRAVENOUS
  Filled 2020-10-03: qty 50

## 2020-10-03 MED ORDER — ORAL CARE MOUTH RINSE
15.0000 mL | Freq: Two times a day (BID) | OROMUCOSAL | Status: DC
  Administered 2020-10-03 – 2020-10-08 (×6): 15 mL via OROMUCOSAL

## 2020-10-03 MED ORDER — FUROSEMIDE 10 MG/ML IJ SOLN
80.0000 mg | Freq: Two times a day (BID) | INTRAMUSCULAR | Status: DC
  Administered 2020-10-03 (×2): 80 mg via INTRAVENOUS
  Filled 2020-10-03 (×2): qty 8

## 2020-10-03 NOTE — Progress Notes (Signed)
Patient ID: Tiffany Le, female   DOB: 05-18-1976, 45 y.o.   MRN: [id number]     Advanced Heart Failure Rounding Note  PCP-Cardiologist: Quay Burow, MD   Subjective:    Patient did not get Lasix last night due to error in order. CVP 11-12 this morning with co-ox 68%.  She is on heparin gtt.   CXR today improved bilateral chest opacities.    She is awake on vent, denies chest pain.   HS-TnI peaked at 3190  Echo: EF 35-40%, mild LVH, RV not well-visualized.    Objective:   Weight Range: 115.8 kg Body mass index is 43.82 kg/m.   Vital Signs:   Temp:  [99.3 F (37.4 C)-102.3 F (39.1 C)] 99.4 F (37.4 C) (02/23 0320) Pulse Rate:  [96-121] 98 (02/23 0700) Resp:  [12-32] 24 (02/23 0700) BP: (85-149)/(52-129) 134/90 (02/23 0700) SpO2:  [94 %-100 %] 100 % (02/23 0700) FiO2 (%):  [50 %-100 %] 50 % (02/23 0358) Weight:  [115.8 kg] 115.8 kg (02/23 0500) Last BM Date:  (PTA)  Weight change: Filed Weights   10/03/20 0500  Weight: 115.8 kg    Intake/Output:   Intake/Output Summary (Last 24 hours) at 10/03/2020 0752 Last data filed at 10/03/2020 0700 Gross per 24 hour  Intake 2013.86 ml  Output 930 ml  Net 1083.86 ml      Physical Exam    General:  Well appearing. No resp difficulty HEENT: Normal Neck: Supple. JVP 12. Carotids 2+ bilat; no bruits. No lymphadenopathy or thyromegaly appreciated. Cor: PMI nondisplaced. Regular rate & rhythm. No rubs, gallops or murmurs. Lungs: Crackles at bases.  Abdomen: Soft, nontender, nondistended. No hepatosplenomegaly. No bruits or masses. Good bowel sounds. Extremities: No cyanosis, clubbing, rash, edema Neuro: Alert & orientedx3, cranial nerves grossly intact. moves all 4 extremities w/o difficulty. Affect pleasant   Telemetry   NSR, personally reviewed.    Labs    CBC Recent Labs    10/02/20 0516 10/02/20 0535 10/03/20 0312 10/03/20 0318  WBC 11.6*  --   --  11.6*  HGB 11.3*   < > 11.6* 9.0*  HCT 38.3    < > 34.0* 25.5*  MCV 91.6  --   --  85.9  PLT 356  --   --  249   < > = values in this interval not displayed.   Basic Metabolic Panel Recent Labs    10/02/20 1426 10/03/20 0312 10/03/20 0318  NA 140 138 131*  K 3.7 4.0 4.3  CL 105  --  99  CO2 22  --  19*  GLUCOSE 156*  --  243*  BUN 15  --  17  CREATININE 0.94  --  0.95  CALCIUM 8.2*  --  7.4*  MG  --   --  1.7  PHOS  --   --  3.0   Liver Function Tests Recent Labs    10/02/20 0516  AST 27  ALT 26  ALKPHOS 64  BILITOT 0.4  PROT 7.3  ALBUMIN 2.9*   No results for input(s): LIPASE, AMYLASE in the last 72 hours. Cardiac Enzymes No results for input(s): CKTOTAL, CKMB, CKMBINDEX, TROPONINI in the last 72 hours.  BNP: BNP (last 3 results) Recent Labs    10/02/20 0516  BNP 947.1*    ProBNP (last 3 results) No results for input(s): PROBNP in the last 8760 hours.   D-Dimer No results for input(s): DDIMER in the last 72 hours. Hemoglobin A1C No results for  input(s): HGBA1C in the last 72 hours. Fasting Lipid Panel Recent Labs    10/03/20 0318  TRIG 1,841*   Thyroid Function Tests No results for input(s): TSH, T4TOTAL, T3FREE, THYROIDAB in the last 72 hours.  Invalid input(s): FREET3  Other results:   Imaging    DG Abd 1 View  Result Date: 10/02/2020 CLINICAL DATA:  Orogastric tube placement. EXAM: ABDOMEN - 1 VIEW COMPARISON:  Same day. FINDINGS: The bowel gas pattern is normal. Enteric tube is seen in expected position of distal stomach. No radio-opaque calculi or other significant radiographic abnormality are seen. IMPRESSION: Enteric tube tip seen in expected position of distal stomach. Electronically Signed   By: Marijo Conception M.D.   On: 10/02/2020 13:54   DG Abd 1 View  Result Date: 10/02/2020 CLINICAL DATA:  OG tube placement. EXAM: ABDOMEN - 1 VIEW COMPARISON:  Chest x-ray 10/02/2020 CT 12/02/2019. FINDINGS: OG tube noted with tip over the stomach. No bowel distention. Bibasilar atelectasis  and pulmonary infiltrates again noted. Lucency noted over the right lung base most likely related to atelectasis. To exclude free subdiaphragmatic air, abdominal series with left-side-down decubitus view of the abdomen is suggested. IMPRESSION: 1.  OG tube noted with tip over the stomach. 2. Bibasilar atelectasis and infiltrates again noted. Lucency noted over the right lung base most likely related to atelectasis. To exclude free subdiaphragmatic air, abdominal series with left-side-down decubitus view of the abdomen is suggested. These results will be called to the ordering clinician or representative by the Radiologist Assistant, and communication documented in the PACS or [employer]. Electronically Signed   By: Marcello Moores  Register   On: 10/02/2020 12:54   DG Chest Port 1 View  Result Date: 10/03/2020 CLINICAL DATA:  ETT, respiratory failure EXAM: PORTABLE CHEST 1 VIEW COMPARISON:  Radiograph 10/02/2020 09/24/2020, CT 09/25/2020 FINDINGS: Endotracheal tube tip terminates at the carina. Transesophageal tube tip and side port distal to the GE junction. Right upper extremity PICC tip terminates the mid SVC. Telemetry leads overlie the chest. Significant interval improvement in the dense bilateral airspace opacity seen on comparison radiograph with some persistent hazy and patchy opacities in the mid to lower lungs. No pneumothorax. Possible layering left effusion. No right effusion. No acute osseous or soft tissue abnormality. IMPRESSION: 1. Endotracheal tube tip terminates at the carina. Consider retraction 3-4 cm for optimal positioning. 2. Significant interval improvement in the dense bilateral airspace opacity seen on comparison radiograph. 3. Some persistent opacities mid to lower lungs may reflect residual patchy airspace disease Electronically Signed   By: Lovena Le M.D.   On: 10/03/2020 06:43   ECHOCARDIOGRAM LIMITED  Result Date: 10/02/2020    ECHOCARDIOGRAM LIMITED REPORT   Patient Name:    Tiffany Le Date of Exam: 10/02/2020 Medical Rec #:  [id number]        Height:       64.0 in Accession #:    [id number]       Weight:       244.9 lb Date of Birth:  Oct 02, 1975        BSA:          2.132 m Patient Age:    60 years         BP:           140/101 mmHg Patient Gender: F                HR:           110  bpm. Exam Location:  Inpatient Procedure: Limited Echo and Intracardiac Opacification Agent STAT ECHO Indications:    Acute ischemic heart disease, unspecified I24.9  History:        Patient has prior history of Echocardiogram examinations.                 Previous Myocardial Infarction and CAD; Risk                 Factors:Hypertension, Dyslipidemia, Non-Smoker and Diabetes.                 Pulmonary Edema.  Sonographer:    Leavy Cella Referring Phys: [id number] Z ATKINS IMPRESSIONS  1. Left ventricular ejection fraction, by estimation, is 35 to 40%. The left ventricle has moderately decreased function. The left ventricle demonstrates regional wall motion abnormalities (see scoring diagram/findings for description). There is mild concentric left ventricular hypertrophy. Indeterminate diastolic filling due to E-A fusion.  2. Right ventricular systolic function was not well visualized. The right ventricular size is not well visualized.  3. Left atrial size was mild to moderately dilated.  4. The mitral valve is grossly normal. Trivial mitral valve regurgitation. No evidence of mitral stenosis.  5. The aortic valve is grossly normal. Aortic valve regurgitation is not visualized. No aortic stenosis is present. Comparison(s): No significant change from prior study. Conclusion(s)/Recommendation(s): EF remains reduced, with similar wall motion abnormalities as prior. Even with contrast, technically difficult images. No LV thrombus seen. FINDINGS  Left Ventricle: Left ventricular ejection fraction, by estimation, is 35 to 40%. The left ventricle has moderately decreased function. The left ventricle  demonstrates regional wall motion abnormalities. Definity contrast agent was given IV to delineate the left ventricular endocardial borders. The left ventricular internal cavity size was normal in size. There is mild concentric left ventricular hypertrophy. Indeterminate diastolic filling due to E-A fusion.  LV Wall Scoring: The apex is akinetic. The mid and distal anterior wall, antero-lateral wall, entire anterior septum, apical lateral segment, and apical inferior segment are hypokinetic. The inferior wall, posterior wall, mid inferoseptal segment, basal anterior segment, and basal inferoseptal segment are normal. Right Ventricle: The right ventricular size is not well visualized. Right vetricular wall thickness was not well visualized. Right ventricular systolic function was not well visualized. Left Atrium: Left atrial size was mild to moderately dilated. Right Atrium: Right atrial size was not well visualized. Pericardium: Trivial pericardial effusion is present. Mitral Valve: The mitral valve is grossly normal. Trivial mitral valve regurgitation. No evidence of mitral valve stenosis. Tricuspid Valve: The tricuspid valve is grossly normal. Tricuspid valve regurgitation is trivial. Aortic Valve: The aortic valve is grossly normal. Aortic valve regurgitation is not visualized. No aortic stenosis is present. Pulmonic Valve: The pulmonic valve was not well visualized. Pulmonic valve regurgitation is trivial. Venous: IVC assessment for right atrial pressure unable to be performed due to mechanical ventilation. IAS/Shunts: The interatrial septum was not well visualized. LEFT VENTRICLE PLAX 2D LVIDd:         4.50 cm  Diastology LVIDs:         3.40 cm  LV e' medial:   15.00 cm/s LV PW:         1.60 cm  LV E/e' medial: 8.1 LV IVS:        1.20 cm LVOT diam:     2.00 cm LVOT Area:     3.14 cm  LEFT ATRIUM           Index LA Vol (A4C):  74.4 ml 34.89 ml/m   AORTA Ao Root diam: 3.20 cm MITRAL VALVE MV Area (PHT): 3.37 cm      SHUNTS MV Decel Time: 225 msec     Systemic Diam: 2.00 cm MV E velocity: 121.00 cm/s Buford Dresser MD Electronically signed by Buford Dresser MD Signature Date/Time: 10/02/2020/2:18:01 PM    Final    [place] EKG SITE RITE  Result Date: 10/02/2020 If Site Rite image not attached, placement could not be confirmed due to current cardiac rhythm.     Medications:     Scheduled Medications: . aspirin  81 mg Per Tube Daily  . atorvastatin  80 mg Per Tube Daily  . chlorhexidine gluconate (MEDLINE KIT)  15 mL Mouth Rinse BID  . Chlorhexidine Gluconate Cloth  6 each Topical Daily  . docusate  100 mg Per Tube BID  . furosemide  80 mg Intravenous BID  . insulin aspart  0-20 Units Subcutaneous Q4H  . mouth rinse  15 mL Mouth Rinse 10 times per day  . metoprolol tartrate  5 mg Intravenous Once  . pantoprazole (PROTONIX) IV  40 mg Intravenous QHS  . polyethylene glycol  17 g Per Tube Daily  . potassium chloride  40 mEq Per Tube Daily  . sodium chloride flush  10-40 mL Intracatheter Q12H  . spironolactone  12.5 mg Per Tube Daily     Infusions: . sodium chloride 10 mL/hr at 10/03/20 0700  . amiodarone 30 mg/hr (10/03/20 0700)  . ceFEPime (MAXIPIME) IV Stopped (10/03/20 0538)  . heparin 1,400 Units/hr (10/03/20 0700)  . propofol (DIPRIVAN) infusion 20 mcg/kg/min (10/03/20 0700)     PRN Medications:  sodium chloride, acetaminophen (TYLENOL) oral liquid 160 mg/5 mL, fentaNYL (SUBLIMAZE) injection, fentaNYL (SUBLIMAZE) injection, ondansetron (ZOFRAN) IV, polyethylene glycol, sodium chloride flush   Assessment/Plan   1. CAD: NSTEMI last admission, cath showed multivessel CAD (has strong FH of premature CAD as well as DM and HTN). CABG recommended but she left AMA.  She returned with marked dyspnea/pulmonary edema.  HS-TnI peaked at 3190.  ST elevation across precordium. Echo unchanged with EF 35-40%.  Currently intubated and stable.  I discussed the patient with Dr. [place] with  interventional cardiology and viewed the cath films => she really does not have a good PCI option.  Will plan to stabilize and try to get her to CABG. She denies chest pain this morning.  - Continue ASA 81.  - High dose statin.  - Heparin gtt.  - Hold Plavix for CABG.  - Dr. Orvan Seen following.  2. Acute systolic CHF: Echo (0/86) with EF 35-40%, mild LVH (similar this admission and last).  Elevated lactate in setting of hypoxemia prior to intubation.  CXR with pulmonary edema and possible PNA.  She is volume overloaded on exam.  CVP 11-12 today, co-ox 68%.  Repeat lactate normal.  - Start Lasix 80 mg IV bid.  Replace K.  - Spironolactone 12.5 daily.  3. ID: Tm 102.3 with PCT 1.47, WBCs 11.6.  Concern for possible aspiration PNA.  CXR with bilateral infiltrates.  - Continue cefepime per CCM.  - Cultures sent.  4. Acute hypoxemic respiratory failure: Intubated with pulmonary edema and possible PNA.  - Diuresis.  - Antibiotics.  - Hopefully extubate today.  5. Type 2 diabetes: Control improved recently.  6. VT: Noted in ER though strips not available.  - Continue amiodarone gtt.   CRITICAL CARE Performed by: Loralie Champagne  Total critical care time: 35 minutes  Critical  care time was exclusive of separately billable procedures and treating other patients.  Critical care was necessary to treat or prevent imminent or life-threatening deterioration.  Critical care was time spent personally by me on the following activities: development of treatment plan with patient and/or surrogate as well as nursing, discussions with consultants, evaluation of patient's response to treatment, examination of patient, obtaining history from patient or surrogate, ordering and performing treatments and interventions, ordering and review of laboratory studies, ordering and review of radiographic studies, pulse oximetry and re-evaluation of patient's condition.   Length of Stay: 1  Loralie Champagne, MD  10/03/2020,  7:52 AM  Advanced Heart Failure Team Pager [phone] (M-F; 7a - 4p)  Please contact [facility] for night-coverage after hours (4p -7a ) and weekends on [email or web address]

## 2020-10-03 NOTE — Progress Notes (Signed)
NAME:  Tiffany Le, MRN:  [id number], DOB:  May 06, 1976, LOS: 1 ADMISSION DATE:  10/02/2020, CONSULTATION DATE:  10/02/2020 REFERRING MD:  Tiffany Le, CHIEF COMPLAINT:  Respiratory distress   Brief History:  45 year old female who presented via EMS for acute onset of SOB and hypoxia with desats to 50s on BiPAP requiring intubation.  Recently admitted 2/14-2/17 for SOB/NSTEMI with LHC demonstrating severe CAD. Recommended for CABG vs. PCI, refused and signed out AMA.   History of Present Illness:  Tiffany Le is a 45 year old female with PMHx significant for HTN, HLD, T2DM, CAD (severe, LHC 4/48/18), acute systolic/diastolic HF (LVEF 56-31% 4/97/02). Patient was recently admitted to [facility] 2/14 - 2/17 for SOB, found to have elevated troponins and NSTEMI. During this admission, LHC was completed demonstrating severe multivessel CAD with recommendation for CABG vs. PCI. Patient reportedly was concerned about undergoing inpatient procedure and refused at that time, signing out AMA. Discharged on ASA/BB/statin.  Patient was brought in by EMS today for respiratory distress. Reportedly experienced acute onset of SOB and on EMS arrival with increasing somnolence and was saturating in the 50s despite BiPAP administration. She was subsequently intubated in the ER with improvement in saturations.  On arrival to ED patient was tachycardic, post-intubation O2 sats 82-91%, ABG 7.26/48/57/21, WBC 11.6, stable Hgb 11.3, K 3.3, Bicarb 18, Cr 0.96. Lactate was elevated at 6.3 with trop 94 (926 1 week prior during NSTEMI). COVID was negative. PCCM was consulted for admission and further workup.  Past Medical History:   Past Medical History:  Diagnosis Date  . Coronary artery disease   . Diabetes mellitus without complication ([place])   . Diabetic neuropathy ([place]) 02/2020  . Hyperlipidemia age 12  . Hypertension age 58  . Hypocalcemia 11/2019  . Hypokalemia 11/2019  . Iron deficiency anemia   . Obesity   . Proteinuria  12/09/2019   Significant Hospital Events:  2/22 Admitted for SOB, hypoxia and respiratory distress  Consults:  HF/ TCTS   Procedures:  ETT 2/22 >> RUE PICC 2/22 >>  Significant Diagnostic Tests:   2/22 CXR >> Endotracheal tube tip terminates in the mid to lower trachea, 2cm from the carina. Interval development of dense consolidative opacity within both lungs, most pronounced in the right lung and in the left mid lung, possible latering L pleural fluid  2/22 TTE >> 1. Left ventricular ejection fraction, by estimation, is 35 to 40%. The  left ventricle has moderately decreased function. The left ventricle  demonstrates regional wall motion abnormalities (see scoring  diagram/findings for description). There is mild  concentric left ventricular hypertrophy. Indeterminate diastolic filling  due to E-A fusion.  2. Right ventricular systolic function was not well visualized. The right  ventricular size is not well visualized.  3. Left atrial size was mild to moderately dilated.  4. The mitral valve is grossly normal. Trivial mitral valve  regurgitation. No evidence of mitral stenosis.  5. The aortic valve is grossly normal. Aortic valve regurgitation is not  visualized. No aortic stenosis is present.  Comparison(s): No significant change from prior study.  Micro Data:  2/22 COVID/ flu >> negative 2/22 BCx >> 2/22 Resp Cx >> 2/22 MRSA >> neg  Antimicrobials:  Cefepime 2/22 >> Vanc 2/22 >> 2/23  Interim History / Subjective:  CVP 11-12 coox 68 tmax 102.3 overnight  UOP 930 ml/ 24hr, ~0.3 ml/kg/hr +1L  Remains on heparin and amio gtts Propofol at 20 mcg/kg/min  Remains awake on MV, writing to communicate, denies any  pain or SOB  Objective   Blood pressure 134/90, pulse 98, temperature 99.4 F (37.4 C), temperature source Axillary, resp. rate (!) 24, weight 115.8 kg, SpO2 100 %. CVP:  [5 mmHg-15 mmHg] 12 mmHg  Vent Mode: PRVC FiO2 (%):  [50 %-100 %] 50 % Set Rate:   [24 bmp] 24 bmp Vt Set:  [440 mL] 440 mL PEEP:  [5 cmH20-16 cmH20] 5 cmH20 Plateau Pressure:  [22 cmH20-35 cmH20] 22 cmH20   Intake/Output Summary (Last 24 hours) at 10/03/2020 7654 Last data filed at 10/03/2020 0700 Gross per 24 hour  Intake 2013.86 ml  Output 930 ml  Net 1083.86 ml   Filed Weights   10/03/20 0500  Weight: 115.8 kg    Examination: General:  Pleasant adult female minimally sedated on MV, awake, gesturing with hands and writing HEENT: MM pink/moist, pupils 3/reactive, anicteric, ETT/ OGT Neuro: Awake, f/c, MAE CV: rr, no murmur PULM:  Non labored, clear with faint scattered exp wheeze, diminished in bases, minimal secretions, failed am SBT, low TV/ MV GI: obese, soft, NT, +bs  Extremities: warm/dry, no LE edema  Skin: no rashes   Labs reviewed- Na 140->131, triglycerides 1841, troponin hs 673- 3190- [id number], lactic cleared, WBC 11.6, Hgb 11.3- >9- some overall discrepancies from yesterdays labs, will recheck for verification  CXR reviewed, overall improvement in bilateral infiltrates from yesterday, ETT at carina   Resolved Hospital Problem list     Assessment & Plan:  Acute hypoxic respiratory failure - presumed primarily 2/2 acute pulmonary edema in setting ICM with severe multivessel CAD; however, can not rule out HCAP at this point given CXR findings.   - failed am SBT, hopeful to continue aggressive diuresis as below and then reassess, hopeful for extubation later today - continue MV for now, currently at8cc/kgIBW with goal plateau pressure < 30 and DP < 15 - decrease rate to 20 given resp alk on AM ABG, patient currently not breathing over set rate; also will retract ETT by 3cm as CXR shows ETT at carina - wean PEEP/ FiO2 per protocol - Ventilator associated pneumonia prevention protocol - intermittent CXR/ ABG - given improvement in CXR, likely just pulmonary edema, however was febrile Tmax 102.3 and trach asp pending - continue cefepime for now, d/c vanc  given neg MRSA - continue PAD protocol with low dose propofol with prn fentanyl.  Repeating all AM labs given inconsistencies, if triglycerides remain high, will need to d/c, possibly change to precedex if needed, may be ok with prn fentanyl, RASS goal 0/-1 w/ bowel regimen   Severe multivessel disease (mLAD 95%, 70% bifurcation high OM1 95%, bifurcation Lcx-OM2 65-70% and Le caliber codominant RCA with 85% stenosis) - seen by cards and TCTS on recent admit who recommended CABG while inpatient as well as PCI both of which pt refused and left AMA. Hx HTN, HLD, ICM (recent echo 2/16 with EF 35 - 40% - per Cardiology/ TCTS- Dr. Orvan Seen following  - hopeful to extubate patient later today to have discussions for surgery/ plans given prior refusal  - heparin gtt per pharmacy  - continue ASA, statin, plavix on hold for anticipated CABG - continue lasix 80 mg BID - spironolactone 12.31m daily  - TTE 2/22 showing EF 35-40% and RV not as well visualized, no major changes compared to TTE 2/16 - trend CVP/ coox  - strict I/Os/ daily wts  - await repeated AM labs  At risk of AKI/ oliguria - diuresis as above - Trend BMP /  urinary output closely  - Avoid nephrotoxic agents, ensure adequate renal perfusion   VT noted in ER - continue amio gtt for now - tele monitoring, no further ectopy thus far - goal K > 4; Mag >2   Hypokalemia. - ongoing diuresis, KCL 40 meq x 1 then replace electrolytes as indicated - Mag 1.7, will replete with 2gm, for goal > 2   Hx DM. - SSI resistant, remains controlled on q 4 CBG - Holding home Glipizide, Lantus.  Hx diabetic neuropathy. - Holding home Gabapentin, Cyclobenzaprine.   Best practice (evaluated daily)  Diet: NPO; if not extubated today will start TF  Pain/Anxiety/Delirium protocol (if indicated): Per protocol VAP protocol (if indicated): In place DVT prophylaxis: heparin gtt  GI prophylaxis: PPI Glucose control: SSI Mobility:  Bedrest Disposition: ICU  No family currently at bedside  Goals of Care:  Last date of multidisciplinary goals of care discussion: Pending Family and staff present:  Summary of discussion:  Follow up goals of care discussion due: 2/29 Code Status: Full  Labs   CBC: Recent Labs  Lab 09/27/20 0221 10/02/20 0516 10/02/20 0535 10/02/20 0554 10/03/20 0312 10/03/20 0318  WBC 7.8 11.6*  --   --   --  11.6*  HGB 10.8* 11.3* 12.6 10.9* 11.6* 9.0*  HCT 34.8* 38.3 37.0 32.0* 34.0* 25.5*  MCV 86.1 91.6  --   --   --  85.9  PLT 326 356  --   --   --  147    Basic Metabolic Panel: Recent Labs  Lab 09/27/20 0221 10/02/20 0516 10/02/20 0535 10/02/20 0554 10/02/20 1426 10/03/20 0312 10/03/20 0318  NA 135 138 141 139 140 138 131*  K 3.6 3.3* 3.4* 3.1* 3.7 4.0 4.3  CL 101 104  --   --  105  --  99  CO2 23 18*  --   --  22  --  19*  GLUCOSE 122* 307*  --   --  156*  --  243*  BUN 20 13  --   --  15  --  17  CREATININE 0.80 0.96  --   --  0.94  --  0.95  CALCIUM 8.6* 8.3*  --   --  8.2*  --  7.4*  MG  --   --   --   --   --   --  1.7  PHOS  --   --   --   --   --   --  3.0   GFR: Estimated Creatinine Clearance: 94.4 mL/min (by C-G formula based on SCr of 0.95 mg/dL). Recent Labs  Lab 09/27/20 0221 10/02/20 0516 10/02/20 0818 10/02/20 0834 10/02/20 1426 10/02/20 2252 10/03/20 0318  PROCALCITON  --   --  1.47  --   --   --   --   WBC 7.8 11.6*  --   --   --   --  11.6*  LATICACIDVEN  --  6.3*  --  4.1* 3.3* 1.9  --     Liver Function Tests: Recent Labs  Lab 10/02/20 0516  AST 27  ALT 26  ALKPHOS 64  BILITOT 0.4  PROT 7.3  ALBUMIN 2.9*   No results for input(s): LIPASE, AMYLASE in the last 168 hours. No results for input(s): AMMONIA in the last 168 hours.  ABG    Component Value Date/Time   PHART 7.501 (H) 10/03/2020 0312   PCO2ART 27.7 (L) 10/03/2020 0312   PO2ART 131 (H) 10/03/2020  0312   HCO3 21.7 10/03/2020 0312   TCO2 23 10/03/2020 0312    ACIDBASEDEF 1.0 10/03/2020 0312   O2SAT 68.0 10/03/2020 0330     Coagulation Profile: Recent Labs  Lab 10/02/20 0516  INR 1.1    Cardiac Enzymes: No results for input(s): CKTOTAL, CKMB, CKMBINDEX, TROPONINI in the last 168 hours.  HbA1C: Hemoglobin A1C  Date/Time Value Ref Range Status  02/28/2020 04:17 PM 8.3 (A) 4.0 - 5.6 % Final   HbA1c, POC (prediabetic range)  Date/Time Value Ref Range Status  02/28/2020 04:17 PM 8.3 (A) 5.7 - 6.4 % Final   HbA1c, POC (controlled diabetic range)  Date/Time Value Ref Range Status  02/28/2020 04:17 PM 8.3 (A) 0.0 - 7.0 % Final   HbA1c POC (<> result, manual entry)  Date/Time Value Ref Range Status  02/28/2020 04:17 PM 8.3 4.0 - 5.6 % Final   Hgb A1c MFr Bld  Date/Time Value Ref Range Status  09/26/2020 02:53 AM 7.8 (H) 4.8 - 5.6 % Final    Comment:    (NOTE)         Prediabetes: 5.7 - 6.4         Diabetes: >6.4         Glycemic control for adults with diabetes: <7.0   12/02/2019 12:30 PM 11.8 (H) 4.8 - 5.6 % Final    Comment:    (NOTE) Pre diabetes:          5.7%-6.4% Diabetes:              >6.4% Glycemic control for   <7.0% adults with diabetes     CBG: Recent Labs  Lab 10/02/20 1520 10/02/20 1953 10/02/20 2347 10/03/20 0317 10/03/20 0721  GLUCAP 130* 102* 140* 124* 125*    Critical care time: 40 minutes   Kennieth Rad, NP [facility] 10/03/20 8:06 AM  Please see [email or web address] for pager details.

## 2020-10-03 NOTE — Progress Notes (Addendum)
[place] for Heparin Indication: chest pain/ACS  Allergies  Allergen Reactions  . Penicillins Rash    Historical Has patient had a PCN reaction causing immediate rash, facial/tongue/throat swelling, SOB or lightheadedness with hypotension: No Has patient had a PCN reaction causing severe rash involving mucus membranes or skin necrosis: No Has patient had a PCN reaction that required hospitalization: No Has patient had a PCN reaction occurring within the last 10 years: No If all of the above answers are "NO", then may proceed with Cephalosporin use.    Patient Measurements: Weight: 115.8 kg (255 lb 4.7 oz) Heparin Dosing Weight: 80.6 kg  Vital Signs: Temp: 99.4 F (37.4 C) (02/23 0320) Temp Source: Axillary (02/23 0320) BP: 105/81 (02/23 0800) Pulse Rate: 97 (02/23 0800)  Labs: Recent Labs    10/02/20 0516 10/02/20 0535 10/02/20 0554 10/02/20 0818 10/02/20 1426 10/02/20 1517 10/02/20 1618 10/02/20 2252 10/03/20 0312 10/03/20 0318  HGB 11.3*   < > 10.9*  --   --   --   --   --  11.6* 9.0*  HCT 38.3   < > 32.0*  --   --   --   --   --  34.0* 25.5*  PLT 356  --   --   --   --   --   --   --   --  249  APTT 23*  --   --   --   --   --   --   --   --   --   LABPROT 13.4  --   --   --   --   --   --   --   --   --   INR 1.1  --   --   --   --   --   --   --   --   --   HEPARINUNFRC  --   --   --   --   --  0.42  --   --   --  0.40  CREATININE 0.96  --   --   --  0.94  --   --   --   --  0.95  TROPONINIHS 94*  --   --  673*  --   --  3,190* 2,868*  --   --    < > = values in this interval not displayed.    Estimated Creatinine Clearance: 94.4 mL/min (by C-G formula based on SCr of 0.95 mg/dL).   Medical History: Past Medical History:  Diagnosis Date  . Coronary artery disease   . Diabetes mellitus without complication ([place])   . Diabetic neuropathy ([place]) 02/2020  . Hyperlipidemia age 74  . Hypertension age 62  . Hypocalcemia  11/2019  . Hypokalemia 11/2019  . Iron deficiency anemia   . Obesity   . Proteinuria 12/09/2019    Medications:  Infusions:  . sodium chloride 10 mL/hr at 10/03/20 0700  . amiodarone 30 mg/hr (10/03/20 0700)  . ceFEPime (MAXIPIME) IV Stopped (10/03/20 0538)  . heparin 1,400 Units/hr (10/03/20 0700)  . magnesium sulfate bolus IVPB    . propofol (DIPRIVAN) infusion 20 mcg/kg/min (10/03/20 0900)    Assessment: 45 y.o. female admitted with respiratory distress, intubated in the ED. Patient recently admitted for NSTEMI, found to have severe multivessel CAD, left AMA 09/27/20 after refusing CABG. Pt was also offered high risk PCI which was refused as well. Pharmacy has been  consulted to start heparin. Plans are for CABG when more stable -heparin level at goal -hg trend down. No bleeding noted  Goal of Therapy:  Heparin level 0.3-0.7 units/ml Monitor platelets by anticoagulation protocol: Yes   Plan:  Continue heparin 1400 units/h Watch Hg trend  Daily heparin level and CBC  Hildred Laser, PharmD Clinical Pharmacist **Pharmacist phone directory can now be found on [email or web address] ([email or web address]).  Listed under [facility].

## 2020-10-03 NOTE — Progress Notes (Signed)
Pre-CABG testing has been completed. Preliminary results can be found in CV Proc through chart review.   10/03/20 3:27 PM Tiffany Le RVT

## 2020-10-03 NOTE — Progress Notes (Signed)
RT NOTE: ETT retracted 3 cm per order per chest xray to 22 at the lips. Vitals are stable. RT will continue to monitor.

## 2020-10-03 NOTE — Procedures (Signed)
Extubation Procedure Note  Patient Details:   Name: Tiffany Le DOB: Feb 02, 1976 MRN: [id number]   Airway Documentation:    Vent end date: 10/03/20 Vent end time: 1720   Evaluation  O2 sats: stable throughout Complications: No apparent complications Patient did tolerate procedure well. Bilateral Breath Sounds: Clear,Diminished   Yes   Pt was extubated per MD order and placed on 4 L [place]. Cuff leak was noted prior to extubation and no stridor post. Pt is stable at this time. RT will monitor.   Ronaldo Miyamoto 10/03/2020, 5:24 PM

## 2020-10-03 NOTE — Plan of Care (Signed)

## 2020-10-04 ENCOUNTER — Encounter (HOSPITAL_COMMUNITY): Payer: Self-pay

## 2020-10-04 ENCOUNTER — Inpatient Hospital Stay (HOSPITAL_COMMUNITY): Payer: Medicaid Other

## 2020-10-04 LAB — RAPID URINE DRUG SCREEN, HOSP PERFORMED
Amphetamines: NOT DETECTED
Barbiturates: NOT DETECTED
Benzodiazepines: NOT DETECTED
Cocaine: NOT DETECTED
Opiates: NOT DETECTED
Tetrahydrocannabinol: NOT DETECTED

## 2020-10-04 LAB — CBC
HCT: 35.3 % — ABNORMAL LOW (ref 36.0–46.0)
Hemoglobin: 10.5 g/dL — ABNORMAL LOW (ref 12.0–15.0)
MCH: 26.9 pg (ref 26.0–34.0)
MCHC: 29.7 g/dL — ABNORMAL LOW (ref 30.0–36.0)
MCV: 90.3 fL (ref 80.0–100.0)
Platelets: 311 10*3/uL (ref 150–400)
RBC: 3.91 MIL/uL (ref 3.87–5.11)
RDW: 14.5 % (ref 11.5–15.5)
WBC: 10.5 10*3/uL (ref 4.0–10.5)
nRBC: 0 % (ref 0.0–0.2)

## 2020-10-04 LAB — GLUCOSE, CAPILLARY
Glucose-Capillary: 174 mg/dL — ABNORMAL HIGH (ref 70–99)
Glucose-Capillary: 161 mg/dL — ABNORMAL HIGH (ref 70–99)
Glucose-Capillary: 160 mg/dL — ABNORMAL HIGH (ref 70–99)
Glucose-Capillary: 118 mg/dL — ABNORMAL HIGH (ref 70–99)
Glucose-Capillary: 128 mg/dL — ABNORMAL HIGH (ref 70–99)

## 2020-10-04 LAB — RENAL FUNCTION PANEL
Albumin: 2.7 g/dL — ABNORMAL LOW (ref 3.5–5.0)
Anion gap: 12 (ref 5–15)
BUN: 22 mg/dL — ABNORMAL HIGH (ref 6–20)
CO2: 20 mmol/L — ABNORMAL LOW (ref 22–32)
Calcium: 8.1 mg/dL — ABNORMAL LOW (ref 8.9–10.3)
Chloride: 106 mmol/L (ref 98–111)
Creatinine, Ser: 1 mg/dL (ref 0.44–1.00)
GFR, Estimated: >60 mL/min (ref >60)
Glucose, Bld: 216 mg/dL — ABNORMAL HIGH (ref 70–99)
Phosphorus: 4.9 mg/dL — ABNORMAL HIGH (ref 2.5–4.6)
Potassium: 4.2 mmol/L (ref 3.5–5.1)
Sodium: 138 mmol/L (ref 135–145)

## 2020-10-04 LAB — COOXEMETRY PANEL
Carboxyhemoglobin: 0.8 % (ref 0.5–1.5)
Methemoglobin: 1.2 % (ref 0.0–1.5)
O2 Saturation: 70.2 %
Total hemoglobin: 10.7 g/dL — ABNORMAL LOW (ref 12.0–16.0)

## 2020-10-04 LAB — CULTURE, RESPIRATORY W GRAM STAIN: Culture: NORMAL

## 2020-10-04 LAB — MAGNESIUM: Magnesium: 2.4 mg/dL (ref 1.7–2.4)

## 2020-10-04 LAB — HEPARIN LEVEL (UNFRACTIONATED): Heparin Unfractionated: 0.4 IU/mL (ref 0.30–0.70)

## 2020-10-04 MED ORDER — FUROSEMIDE 10 MG/ML IJ SOLN
INTRAMUSCULAR | Status: AC
  Filled 2020-10-04: qty 4

## 2020-10-04 MED ORDER — NITROGLYCERIN IN D5W 200-5 MCG/ML-% IV SOLN
0.0000 ug/min | INTRAVENOUS | Status: DC
  Administered 2020-10-04: 5 ug/min via INTRAVENOUS
  Filled 2020-10-04: qty 250

## 2020-10-04 MED ORDER — FUROSEMIDE 10 MG/ML IJ SOLN
120.0000 mg | Freq: Two times a day (BID) | INTRAVENOUS | Status: DC
  Filled 2020-10-04 (×2): qty 12

## 2020-10-04 MED ORDER — ORAL CARE MOUTH RINSE
15.0000 mL | Freq: Two times a day (BID) | OROMUCOSAL | Status: DC
  Administered 2020-10-04 – 2020-10-08 (×8): 15 mL via OROMUCOSAL

## 2020-10-04 MED ORDER — FUROSEMIDE 10 MG/ML IJ SOLN: 80.0000 mg | INTRAMUSCULAR | Status: AC

## 2020-10-04 MED ORDER — FUROSEMIDE 10 MG/ML IJ SOLN
80.0000 mg | Freq: Two times a day (BID) | INTRAMUSCULAR | Status: DC
  Administered 2020-10-04 – 2020-10-07 (×7): 80 mg via INTRAVENOUS
  Filled 2020-10-04 (×8): qty 8

## 2020-10-04 MED ORDER — FUROSEMIDE 10 MG/ML IJ SOLN
INTRAMUSCULAR | Status: AC
  Administered 2020-10-04: 80 mg via INTRAVENOUS
  Filled 2020-10-04: qty 8

## 2020-10-04 MED ORDER — CHLORHEXIDINE GLUCONATE 0.12 % MT SOLN
15.0000 mL | Freq: Two times a day (BID) | OROMUCOSAL | Status: DC
  Administered 2020-10-04 – 2020-10-08 (×5): 15 mL via OROMUCOSAL
  Filled 2020-10-04 (×7): qty 15

## 2020-10-04 MED ORDER — ACETAMINOPHEN 10 MG/ML IV SOLN
1000.0000 mg | Freq: Once | INTRAVENOUS | Status: AC
  Administered 2020-10-04: 1000 mg via INTRAVENOUS
  Filled 2020-10-04: qty 100

## 2020-10-04 MED ORDER — FUROSEMIDE 10 MG/ML IJ SOLN: 120.0000 mg | Freq: Two times a day (BID) | INTRAVENOUS | Status: DC

## 2020-10-04 MED ORDER — LEVALBUTEROL HCL 0.63 MG/3ML IN NEBU
INHALATION_SOLUTION | RESPIRATORY_TRACT | Status: AC
  Filled 2020-10-04: qty 3

## 2020-10-04 MED ORDER — DEXTROSE 5 % IV SOLN
500.0000 mg | Freq: Once | INTRAVENOUS | Status: AC
  Administered 2020-10-04: 500 mg via INTRAVENOUS
  Filled 2020-10-04: qty 500

## 2020-10-04 MED ORDER — FUROSEMIDE 10 MG/ML IJ SOLN
40.0000 mg | INTRAMUSCULAR | Status: AC
  Administered 2020-10-04: 40 mg via INTRAVENOUS

## 2020-10-04 MED ORDER — METOLAZONE 2.5 MG PO TABS: 2.5000 mg | ORAL_TABLET | Freq: Once | ORAL | Status: DC

## 2020-10-04 MED ORDER — PANTOPRAZOLE SODIUM 40 MG PO TBEC: 40.0000 mg | DELAYED_RELEASE_TABLET | Freq: Every day | ORAL | Status: DC

## 2020-10-04 MED ORDER — PANTOPRAZOLE SODIUM 40 MG IV SOLR
40.0000 mg | Freq: Every day | INTRAVENOUS | Status: DC
  Administered 2020-10-04 – 2020-10-05 (×2): 40 mg via INTRAVENOUS
  Filled 2020-10-04 (×2): qty 40

## 2020-10-04 MED ORDER — VANCOMYCIN HCL 1000 MG/200ML IV SOLN
1000.0000 mg | Freq: Two times a day (BID) | INTRAVENOUS | Status: DC
  Administered 2020-10-04: 1000 mg via INTRAVENOUS
  Filled 2020-10-04: qty 200

## 2020-10-04 MED ORDER — FUROSEMIDE 10 MG/ML IJ SOLN: 80.0000 mg | Freq: Once | INTRAMUSCULAR | Status: DC

## 2020-10-04 MED ORDER — LEVALBUTEROL HCL 0.63 MG/3ML IN NEBU
0.6300 mg | INHALATION_SOLUTION | Freq: Four times a day (QID) | RESPIRATORY_TRACT | Status: DC | PRN
  Administered 2020-10-04: 0.63 mg via RESPIRATORY_TRACT

## 2020-10-04 MED ORDER — VANCOMYCIN HCL 2000 MG/400ML IV SOLN
2000.0000 mg | Freq: Once | INTRAVENOUS | Status: AC
  Administered 2020-10-04: 2000 mg via INTRAVENOUS
  Filled 2020-10-04: qty 400

## 2020-10-04 MED ORDER — FUROSEMIDE 10 MG/ML IJ SOLN
120.0000 mg | Freq: Two times a day (BID) | INTRAVENOUS | Status: DC
  Administered 2020-10-04: 120 mg via INTRAVENOUS
  Filled 2020-10-04: qty 2
  Filled 2020-10-04: qty 12

## 2020-10-04 MED ORDER — LORAZEPAM 2 MG/ML IJ SOLN: 0.5000 mg | Freq: Once | INTRAMUSCULAR | Status: DC | PRN

## 2020-10-04 MED ORDER — HYDRALAZINE HCL 20 MG/ML IJ SOLN
10.0000 mg | INTRAMUSCULAR | Status: DC | PRN
  Administered 2020-10-04: 20 mg via INTRAVENOUS
  Filled 2020-10-04: qty 1

## 2020-10-04 MED ORDER — METOPROLOL TARTRATE 25 MG PO TABS: 25.0000 mg | ORAL_TABLET | Freq: Two times a day (BID) | ORAL | Status: DC

## 2020-10-04 MED ORDER — LISINOPRIL 10 MG PO TABS: 10.0000 mg | ORAL_TABLET | Freq: Every day | ORAL | Status: DC

## 2020-10-04 NOTE — Progress Notes (Signed)
Attempted to take pt off of BIPAP and place on [place]. Pt immediately desaturated into mid 70s with increase to 10L [place]. BIPAP was placed back on. CCM aware.

## 2020-10-04 NOTE — Progress Notes (Signed)
PT was taken off of BIPAP and placed on HHFNC 70%/20L. Pt is tolerating well at this time. RT will monitor.

## 2020-10-04 NOTE — Progress Notes (Signed)
NAME:  Tiffany Le, MRN:  [id number], DOB:  Nov 02, 1975, LOS: 2 ADMISSION DATE:  10/02/2020, CONSULTATION DATE:  10/02/2020 REFERRING MD:  Sedonia Small, CHIEF COMPLAINT:  Respiratory distress   Brief History:  45 year old female who presented via EMS for acute onset of SOB and hypoxia with desats to 50s on BiPAP requiring intubation.  Recently admitted 2/14-2/17 for SOB/NSTEMI with LHC demonstrating severe CAD. Recommended for CABG vs. PCI, refused and signed out AMA.   History of Present Illness:  Tiffany Le is a 45 year old female with PMHx significant for HTN, HLD, T2DM, CAD (severe, LHC 5/39/76), acute systolic/diastolic HF (LVEF 73-41% 9/37/90). Patient was recently admitted to [facility] 2/14 - 2/17 for SOB, found to have elevated troponins and NSTEMI. During this admission, LHC was completed demonstrating severe multivessel CAD with recommendation for CABG vs. PCI. Patient reportedly was concerned about undergoing inpatient procedure and refused at that time, signing out AMA. Discharged on ASA/BB/statin.  Patient was brought in by EMS today for respiratory distress. Reportedly experienced acute onset of SOB and on EMS arrival with increasing somnolence and was saturating in the 50s despite BiPAP administration. She was subsequently intubated in the ER with improvement in saturations.  On arrival to ED patient was tachycardic, post-intubation O2 sats 82-91%, ABG 7.26/48/57/21, WBC 11.6, stable Hgb 11.3, K 3.3, Bicarb 18, Cr 0.96. Lactate was elevated at 6.3 with trop 94 (926 1 week prior during NSTEMI). COVID was negative. PCCM was consulted for admission and further workup.  Past Medical History:   Past Medical History:  Diagnosis Date  . Coronary artery disease   . Diabetes mellitus without complication ([place])   . Diabetic neuropathy ([place]) 02/2020  . Hyperlipidemia age 62  . Hypertension age 43  . Hypocalcemia 11/2019  . Hypokalemia 11/2019  . Iron deficiency anemia   . Obesity   . Proteinuria  12/09/2019   Significant Hospital Events:  2/22 Admitted for SOB, hypoxia and respiratory distress  Consults:  HF/ TCTS   Procedures:  ETT 2/22 > 2/23 RUE PICC 2/22 >>  Significant Diagnostic Tests:   2/22 CXR >> Endotracheal tube tip terminates in the mid to lower trachea, 2cm from the carina. Interval development of dense consolidative opacity within both lungs, most pronounced in the right lung and in the left mid lung, possible latering L pleural fluid  2/22 TTE >> 1. Left ventricular ejection fraction, by estimation, is 35 to 40%. The  left ventricle has moderately decreased function. The left ventricle  demonstrates regional wall motion abnormalities (see scoring  diagram/findings for description). There is mild  concentric left ventricular hypertrophy. Indeterminate diastolic filling  due to E-A fusion.  2. Right ventricular systolic function was not well visualized. The right  ventricular size is not well visualized.  3. Left atrial size was mild to moderately dilated.  4. The mitral valve is grossly normal. Trivial mitral valve  regurgitation. No evidence of mitral stenosis.  5. The aortic valve is grossly normal. Aortic valve regurgitation is not  visualized. No aortic stenosis is present.  Comparison(s): No significant change from prior study.  Micro Data:  2/22 COVID/ flu >> negative 2/22 BCx >> 2/22 Resp Cx >> 2/22 MRSA >> neg  Antimicrobials:  Cefepime 2/22 >> Vanc 2/22 >> 2/23  Interim History / Subjective:  Extubated 2/23 late afternoon 4LNC escalated to BiPAP overnight Started on nitro gtt overnight  This morning, taken off BiPAP briefly for mouth swab, desaturated into 70s, placed back on biPAP with recovery to 100%  Endorses anxiety this morning   Objective   Blood pressure (!) 140/95, pulse (!) 120, temperature 98.8 F (37.1 C), temperature source Axillary, resp. rate (!) 39, weight 111.5 kg, SpO2 96 %. CVP:  [12 mmHg-19 mmHg] 14 mmHg   Vent Mode: PSV;CPAP FiO2 (%):  [40 %] 40 % Set Rate:  [20 bmp-24 bmp] 20 bmp Vt Set:  [440 mL] 440 mL PEEP:  [5 cmH20] 5 cmH20 Pressure Support:  [12 cmH20] 12 cmH20 Plateau Pressure:  [23 cmH20-24 cmH20] 24 cmH20   Intake/Output Summary (Last 24 hours) at 10/04/2020 0726 Last data filed at 10/04/2020 6734 Gross per 24 hour  Intake 1258.28 ml  Output 2985 ml  Net -1726.72 ml   Filed Weights   10/03/20 0500 10/04/20 0515  Weight: 115.8 kg 111.5 kg    Examination: General:  Chronically and acutely ill appearing appearing middle aged F, reclined in bed on BiPAP NAD  HEENT: NCAT pink mm anicteric sclera trachea midline BiPAP in place with good seal  Neuro: Awake, following commands. PERRL  CV: tachycardic rate rr. S1s2. Cap refill brisk  PULM:  LUL wheeze. Scattered crackles Diminished bibasilar sounds  GI: Obese soft ndnt + bowel sounds  Extremities: c/d/w. No acute joint deformity, no cyanosis. Acrylic fingernails  Skin: c/d/w without rash    Resolved Hospital Problem list   Hypokalemia., improved   Assessment & Plan:   Anxiety, acute -r/t BiPAP, respiratory status P - precedex   Acute respiratory failure with hypoxia -presumed acute cardiogenic pulm edema, possible HCAP, component of atelectasis, pleural effusion  P - Cont BiPAP -high risk for re-intubation  -cont cefepime -Lasix incr to 120mg  BID by cards   Severe multivessel disease -(mLAD 95%, 70% bifurcation high OM1 95%, bifurcation Lcx-OM2 65-70% and small caliber codominant RCA with 85% stenosis)  - seen by cards and TCTS on recent admit who recommended CABG while inpatient as well as PCI both of which pt refused and left AMA. ICM -TTE 2/22- EF 35-50% Hx HTN, HLD P -Cards and CVTS following -- ultimately hope for OR w [facility], need to stabilize resp status first  -nitro gtt per cards - Lasix 120mg  BID  - spironolactone 12.5mg , zestrl 10mg , lipitor 80mg   - heparin gtt per pharmacy  - continue ASA,  statin, plavix on hold for anticipated CABG  - trend CVP/ coox  - strict I/Os/ daily wts  -send UDS -- checking for cocaine  -PRN BD for wheeze   At risk for AKI  P - diuresis as above - trend renal indices, strict I/O  - Avoid nephrotoxic agents, ensure adequate renal perfusion  VTach, improved -noted in ED P - cont ICU monitoring  - cont amio gtt - goal K > 4; Mag >2  DM - Holding home Glipizide, Lantus. P - SSI resistant, remains controlled on q 4 CBG  Hx diabetic neuropathy. - Holding home Gabapentin, Cyclobenzaprine.   Best practice (evaluated daily)  Diet: NPO; if not extubated today will start TF  Pain/Anxiety/Delirium protocol (if indicated): Per protocol VAP protocol (if indicated): In place DVT prophylaxis: heparin gtt  GI prophylaxis: PPI Glucose control: SSI Mobility: Bedrest Disposition: ICU   Goals of Care:  Last date of multidisciplinary goals of care discussion: Pending Family and staff present:  Summary of discussion:  Follow up goals of care discussion due: 2/29 Code Status: Full  Labs   CBC: Recent Labs  Lab 10/02/20 0516 10/02/20 0535 10/02/20 0554 10/03/20 1937 10/03/20 9024 10/03/20 0973 10/04/20 0232  WBC 11.6*  --   --   --  11.6* 11.0* 10.5  HGB 11.3*   < > 10.9* 11.6* 9.0* 8.6* 10.5*  HCT 38.3   < > 32.0* 34.0* 25.5* 26.8* 35.3*  MCV 91.6  --   --   --  85.9 86.5 90.3  PLT 356  --   --   --  249 251 311   < > = values in this interval not displayed.    Basic Metabolic Panel: Recent Labs  Lab 10/02/20 0516 10/02/20 0535 10/02/20 1426 10/03/20 0312 10/03/20 0318 10/03/20 0852 10/04/20 0232  NA 138   < > 140 138 131* 138 138  K 3.3*   < > 3.7 4.0 4.3 3.7 4.2  CL 104  --  105  --  99 105 106  CO2 18*  --  22  --  19* 21* 20*  GLUCOSE 307*  --  156*  --  243* 223* 216*  BUN 13  --  15  --  17 18 22*  CREATININE 0.96  --  0.94  --  0.95 0.91 1.00  CALCIUM 8.3*  --  8.2*  --  7.4* 7.9* 8.1*  MG  --   --   --   --   1.7  --  2.4  PHOS  --   --   --   --  3.0 3.4 4.9*   < > = values in this interval not displayed.   GFR: Estimated Creatinine Clearance: 87.7 mL/min (by C-G formula based on SCr of 1 mg/dL). Recent Labs  Lab 10/02/20 0516 10/02/20 0818 10/02/20 0834 10/02/20 1426 10/02/20 2252 10/03/20 0318 10/03/20 0852 10/04/20 0232  PROCALCITON  --  1.47  --   --   --   --   --   --   WBC 11.6*  --   --   --   --  11.6* 11.0* 10.5  LATICACIDVEN 6.3*  --  4.1* 3.3* 1.9  --   --   --     Liver Function Tests: Recent Labs  Lab 10/02/20 0516 10/03/20 0852 10/04/20 0232  AST 27  --   --   ALT 26  --   --   ALKPHOS 64  --   --   BILITOT 0.4  --   --   PROT 7.3  --   --   ALBUMIN 2.9* 2.3* 2.7*   No results for input(s): LIPASE, AMYLASE in the last 168 hours. No results for input(s): AMMONIA in the last 168 hours.  ABG    Component Value Date/Time   PHART 7.501 (H) 10/03/2020 0312   PCO2ART 27.7 (L) 10/03/2020 0312   PO2ART 131 (H) 10/03/2020 0312   HCO3 21.7 10/03/2020 0312   TCO2 23 10/03/2020 0312   ACIDBASEDEF 1.0 10/03/2020 0312   O2SAT 70.2 10/04/2020 0441     Coagulation Profile: Recent Labs  Lab 10/02/20 0516  INR 1.1    Cardiac Enzymes: No results for input(s): CKTOTAL, CKMB, CKMBINDEX, TROPONINI in the last 168 hours.  HbA1C: Hemoglobin A1C  Date/Time Value Ref Range Status  02/28/2020 04:17 PM 8.3 (A) 4.0 - 5.6 % Final   HbA1c, POC (prediabetic range)  Date/Time Value Ref Range Status  02/28/2020 04:17 PM 8.3 (A) 5.7 - 6.4 % Final   HbA1c, POC (controlled diabetic range)  Date/Time Value Ref Range Status  02/28/2020 04:17 PM 8.3 (A) 0.0 - 7.0 % Final   HbA1c POC (<> result, manual  entry)  Date/Time Value Ref Range Status  02/28/2020 04:17 PM 8.3 4.0 - 5.6 % Final   Hgb A1c MFr Bld  Date/Time Value Ref Range Status  09/26/2020 02:53 AM 7.8 (H) 4.8 - 5.6 % Final    Comment:    (NOTE)         Prediabetes: 5.7 - 6.4         Diabetes: >6.4          Glycemic control for adults with diabetes: <7.0   12/02/2019 12:30 PM 11.8 (H) 4.8 - 5.6 % Final    Comment:    (NOTE) Pre diabetes:          5.7%-6.4% Diabetes:              >6.4% Glycemic control for   <7.0% adults with diabetes     CBG: Recent Labs  Lab 10/03/20 1126 10/03/20 1539 10/03/20 2006 10/03/20 2311 10/04/20 0335  GLUCAP 144* 127* 126* 98 174*    CRITICAL CARE Performed by: Cristal Generous   Total critical care time: 41 minutes  Critical care time was exclusive of separately billable procedures and treating other patients.  Critical care was necessary to treat or prevent imminent or life-threatening deterioration.  Critical care was time spent personally by me on the following activities: development of treatment plan with patient and/or surrogate as well as nursing, discussions with consultants, evaluation of patient's response to treatment, examination of patient, obtaining history from patient or surrogate, ordering and performing treatments and interventions, ordering and review of laboratory studies, ordering and review of radiographic studies, pulse oximetry and re-evaluation of patient's condition.  Eliseo Gum MSN, AGACNP-BC [place] [phone] If no answer, [phone] 10/04/2020, 7:26 AM

## 2020-10-04 NOTE — Progress Notes (Signed)
Pharmacy Antibiotic Note  Tiffany Le is a 45 y.o. female admitted on 10/02/2020 with pneumonia  Pharmacy has been consulted for vancomycin/cefepime dosing. SCr 1. Vancomycin initially DC prior to administration but ongoing fever, persistent infiltrate on Cxr and recent hospitalization> will add vancomycin.    Plan: Continue Cefepime 2g IV q8h Vancomycin 2000mg  IV x 1; then 1000mg  IV q12h est AUC 448 Goal AUC 400-550. Monitor clinical progress, c/s, renal function F/u de-escalation plan/LOT, vancomycin levels as indicated   Weight: 111.5 kg (245 lb 13 oz)  Temp (24hrs), Avg:99.2 F (37.3 C), Min:97.5 F (36.4 C), Max:101.3 F (38.5 C)  Recent Labs  Lab 10/02/20 0516 10/02/20 0834 10/02/20 1426 10/02/20 2252 10/03/20 0318 10/03/20 0852 10/04/20 0232  WBC 11.6*  --   --   --  11.6* 11.0* 10.5  CREATININE 0.96  --  0.94  --  0.95 0.91 1.00  LATICACIDVEN 6.3* 4.1* 3.3* 1.9  --   --   --     Estimated Creatinine Clearance: 87.7 mL/min (by C-G formula based on SCr of 1 mg/dL).    Allergies  Allergen Reactions  . Penicillins Rash    Historical Has patient had a PCN reaction causing immediate rash, facial/tongue/throat swelling, SOB or lightheadedness with hypotension: No Has patient had a PCN reaction causing severe rash involving mucus membranes or skin necrosis: No Has patient had a PCN reaction that required hospitalization: No Has patient had a PCN reaction occurring within the last 10 years: No If all of the above answers are "NO", then may proceed with Cephalosporin use.    Antimicrobials this admission: 2/24 vancomycin >>  2/22 cefepime >>   Dose adjustments this admission:   Microbiology results:   Bonnita Nasuti Pharm.D. CPP, BCPS Clinical Pharmacist [phone] 10/04/2020 9:00 AM

## 2020-10-04 NOTE — Progress Notes (Signed)
eLink Physician-Brief Progress Note Patient Name: Tiffany Le DOB: 09-11-1975 MRN: [id number]   Date of Service  10/04/2020  HPI/Events of Note  Patient was extubated today and her home anti-hypertensive's were not resumed post-extubation.  eICU Interventions  Home BP medications re-ordered and PRN iv Hydralazine ordered for SBP > 170 mmHg.        [name] 10/04/2020, 12:57 AM

## 2020-10-04 NOTE — Progress Notes (Signed)
Over past half hour pt has become acutely SOB, increased WOB, increased O2 need (3L [place] >> 15L HFNC), increased crackles in lungs bilaterally, coughing up more secretions, feeling more anxious. ?flash edema. Cards fellow notified, given orders for lasix and BiPAP.  Total 120mg  Lasix given IV push per Dr. Kalman Shan to prevent delay in care, as the IVPB lasix is not immediately available since it has to be mixed by pharmacy.  Currently on BiPAP FiO2 100%, sats 91%, HR 133 ST, BP 161/115 (128).  Will continue to monitor closely.

## 2020-10-04 NOTE — Progress Notes (Signed)
RT attempted to placed patient on BIPAP HS per MD order. Patient is currently on [place] tolerating well. Patient stated she really does not want to go back on BIPAP for the night. RT will continue to monitor

## 2020-10-04 NOTE — Progress Notes (Signed)
Needles for Heparin Indication: chest pain/ACS  Allergies  Allergen Reactions  . Penicillins Rash    Historical Has patient had a PCN reaction causing immediate rash, facial/tongue/throat swelling, SOB or lightheadedness with hypotension: No Has patient had a PCN reaction causing severe rash involving mucus membranes or skin necrosis: No Has patient had a PCN reaction that required hospitalization: No Has patient had a PCN reaction occurring within the last 10 years: No If all of the above answers are "NO", then may proceed with Cephalosporin use.    Patient Measurements: Weight: 111.5 kg (245 lb 13 oz) Heparin Dosing Weight: 80.6 kg  Vital Signs: Temp: 101.3 F (38.5 C) (02/24 0733) Temp Source: Axillary (02/24 0733) BP: 132/82 (02/24 0830) Pulse Rate: 120 (02/24 0830)  Labs: Recent Labs    10/02/20 0516 10/02/20 0535 10/02/20 0818 10/02/20 1426 10/02/20 1517 10/02/20 1618 10/02/20 2252 10/03/20 0312 10/03/20 0318 10/03/20 0852 10/04/20 0232 10/04/20 0442  HGB 11.3*   < >  --   --   --   --   --    < > 9.0* 8.6* 10.5*  --   HCT 38.3   < >  --   --   --   --   --    < > 25.5* 26.8* 35.3*  --   PLT 356  --   --   --   --   --   --   --  249 251 311  --   APTT 23*  --   --   --   --   --   --   --   --   --   --   --   LABPROT 13.4  --   --   --   --   --   --   --   --   --   --   --   INR 1.1  --   --   --   --   --   --   --   --   --   --   --   HEPARINUNFRC  --   --   --   --  0.42  --   --   --  0.40  --   --  0.40  CREATININE 0.96  --   --    < >  --   --   --   --  0.95 0.91 1.00  --   TROPONINIHS 94*  --  673*  --   --  3,190* 2,868*  --   --   --   --   --    < > = values in this interval not displayed.    Estimated Creatinine Clearance: 87.7 mL/min (by C-G formula based on SCr of 1 mg/dL).   Medical History: Past Medical History:  Diagnosis Date  . Coronary artery disease   . Diabetes mellitus without  complication ([place])   . Diabetic neuropathy ([place]) 02/2020  . Hyperlipidemia age 41  . Hypertension age 72  . Hypocalcemia 11/2019  . Hypokalemia 11/2019  . Iron deficiency anemia   . Obesity   . Proteinuria 12/09/2019    Medications:  Infusions:  . sodium chloride Stopped (10/04/20 8588)  . acetaminophen    . amiodarone 30 mg/hr (10/04/20 0800)  . ceFEPime (MAXIPIME) IV Stopped (10/04/20 5027)  . chlorothiazide (DIURIL) IV    . dexmedetomidine (PRECEDEX) IV infusion 0.2 mcg/kg/hr (  10/04/20 1103)  . heparin 1,400 Units/hr (10/04/20 0811)  . nitroGLYCERIN 50 mcg/min (10/04/20 0800)  . vancomycin    . vancomycin      Assessment: 45 y.o. female admitted with respiratory distress, intubated in the ED. Patient recently admitted for NSTEMI, found to have severe multivessel CAD, left AMA 09/27/20 after refusing CABG. Pt was also offered high risk PCI which was refused as well. Pharmacy has been consulted to start heparin. Plans are for CABG when more stable -heparin drip 1400 uts/hr heparin level 0.4 at goal -hg trend down now improved, pltc stable. No bleeding noted  Goal of Therapy:  Heparin level 0.3-0.7 units/ml Monitor platelets by anticoagulation protocol: Yes   Plan:  Continue heparin 1400 units/h Daily heparin level and CBC     Bonnita Nasuti Pharm.D. CPP, BCPS Clinical Pharmacist [phone] 10/04/2020 8:57 AM    **Pharmacist phone directory can now be found on [email or web address] ([email or web address]).  Listed under Post.

## 2020-10-04 NOTE — Progress Notes (Addendum)
Patient ID: Tiffany Le, female   DOB: 1976-01-05, 45 y.o.   MRN: [id number]     Advanced Heart Failure Rounding Note  PCP-Cardiologist: Quay Burow, MD   Subjective:    Extubated yesterday but placed on BiPAP overnight. Given 120 mg IV Lasix overnight + started on nitro gtt. -1.2L in UOP overnight. CVP 13. Continues on BiPAP. Sinus tach on tele, 120s. No CP. Co-ox 70%.   Febrile this am. Temp 101.3. WBC 11.6>>10.5. Remains on cefepime.   CXR today w/ new extensive coalescent opacity in the right mid and upper lung favor related to volume loss with atelectasis or mucous plugging though worsening airspace disease or edema is not fully excluded. Additional patchy basilar opacities bilaterally with layering left effusion.  Respiratory cultures pending. BCs NGTD    Objective:   Weight Range: 111.5 kg Body mass index is 42.19 kg/m.   Vital Signs:   Temp:  [97.5 F (36.4 C)-101.3 F (38.5 C)] 101.3 F (38.5 C) (02/24 0733) Pulse Rate:  [73-139] 120 (02/24 0645) Resp:  [18-42] 39 (02/24 0645) BP: (89-169)/(45-122) 140/95 (02/24 0645) SpO2:  [80 %-100 %] 96 % (02/24 0645) FiO2 (%):  [40 %] 40 % (02/23 1600) Weight:  [111.5 kg] 111.5 kg (02/24 0515) Last BM Date:  (PTA)  Weight change: Filed Weights   10/03/20 0500 10/04/20 0515  Weight: 115.8 kg 111.5 kg    Intake/Output:   Intake/Output Summary (Last 24 hours) at 10/04/2020 0745 Last data filed at 10/04/2020 6213 Gross per 24 hour  Intake 1258.28 ml  Output 2985 ml  Net -1726.72 ml      Physical Exam    CVP 13 General:  Moderately obese AAF, on BiPAP. No distress HEENT: Normal Neck: Supple. JVP 13 cm. Carotids 2+ bilat; no bruits. No lymphadenopathy or thyromegaly appreciated. Cor: PMI nondisplaced. Regular rhythm, tachy rate. No rubs, gallops or murmurs. Lungs: course BS R>L   Abdomen: obese, soft, nontender, nondistended. No hepatosplenomegaly. No bruits or masses. Good bowel sounds. Extremities: No  cyanosis, clubbing, rash, trace bilateral LE edema Neuro: Alert & orientedx3, cranial nerves grossly intact. moves all 4 extremities w/o difficulty. Affect pleasant   Telemetry   Sinus tach 120s    Labs    CBC Recent Labs    10/03/20 0852 10/04/20 0232  WBC 11.0* 10.5  HGB 8.6* 10.5*  HCT 26.8* 35.3*  MCV 86.5 90.3  PLT 251 086   Basic Metabolic Panel Recent Labs    10/03/20 0318 10/03/20 0852 10/04/20 0232  NA 131* 138 138  K 4.3 3.7 4.2  CL 99 105 106  CO2 19* 21* 20*  GLUCOSE 243* 223* 216*  BUN 17 18 22*  CREATININE 0.95 0.91 1.00  CALCIUM 7.4* 7.9* 8.1*  MG 1.7  --  2.4  PHOS 3.0 3.4 4.9*   Liver Function Tests Recent Labs    10/02/20 0516 10/03/20 0852 10/04/20 0232  AST 27  --   --   ALT 26  --   --   ALKPHOS 64  --   --   BILITOT 0.4  --   --   PROT 7.3  --   --   ALBUMIN 2.9* 2.3* 2.7*   No results for input(s): LIPASE, AMYLASE in the last 72 hours. Cardiac Enzymes No results for input(s): CKTOTAL, CKMB, CKMBINDEX, TROPONINI in the last 72 hours.  BNP: BNP (last 3 results) Recent Labs    10/02/20 0516  BNP 947.1*    ProBNP (last 3 results)  No results for input(s): PROBNP in the last 8760 hours.   D-Dimer No results for input(s): DDIMER in the last 72 hours. Hemoglobin A1C No results for input(s): HGBA1C in the last 72 hours. Fasting Lipid Panel Recent Labs    10/03/20 0852  TRIG 57   Thyroid Function Tests No results for input(s): TSH, T4TOTAL, T3FREE, THYROIDAB in the last 72 hours.  Invalid input(s): FREET3  Other results:   Imaging    DG Chest Port 1 View  Result Date: 10/04/2020 CLINICAL DATA:  Shortness of breath, respiratory failure, extubated EXAM: PORTABLE CHEST 1 VIEW COMPARISON:  Radiograph 10/03/2020 FINDINGS: Right upper extremity PICC tip terminates at the superior cavoatrial junction. Interval removal of the transesophageal and endotracheal tubes. No extensive coalescent opacity in the right mid and  upper lung. Some additional patchy basilar opacities bilaterally with layering left effusion. No visible pneumothorax. Cardiomediastinal silhouette is partially obscured by overlying density, visible contours are unremarkable. No acute osseous or soft tissue abnormality. IMPRESSION: 1. Interval removal of the transesophageal and endotracheal tubes. 2. New extensive coalescent opacity in the right mid and upper lung favor related to volume loss with atelectasis or mucous plugging though worsening airspace disease or edema is not fully excluded. 3. Additional patchy basilar opacities bilaterally with layering left effusion. Electronically Signed   By: Lovena Le M.D.   On: 10/04/2020 06:49   VAS US DOPPLER PRE CABG  Result Date: 10/03/2020 PREOPERATIVE VASCULAR EVALUATION  Indications:      Pre-CABG. Risk Factors:     Hypertension. Limitations:      Patient body habitus, patient positioning, patient immobility,                   ventilator Comparison Study: No prior studies. Performing Technologist: Carlos Levering RVT  Examination Guidelines: A complete evaluation includes B-mode imaging, spectral Doppler, color Doppler, and power Doppler as needed of all accessible portions of each vessel. Bilateral testing is considered an integral part of a complete examination. Limited examinations for reoccurring indications may be performed as noted.  Right Carotid Findings: +----------+--------+--------+--------+-----------------------+--------+           PSV cm/sEDV cm/sStenosisDescribe               Comments +----------+--------+--------+--------+-----------------------+--------+ CCA Prox  122     35              smooth and heterogenous         +----------+--------+--------+--------+-----------------------+--------+ CCA Distal92      32              smooth and heterogenous         +----------+--------+--------+--------+-----------------------+--------+ ICA Prox  89      42              smooth  and heterogenous         +----------+--------+--------+--------+-----------------------+--------+ ICA Distal73      32                                     tortuous +----------+--------+--------+--------+-----------------------+--------+ ECA       87      11                                              +----------+--------+--------+--------+-----------------------+--------+ Portions of this table  do not appear on this page. +----------+--------+-------+--------+------------+           PSV cm/sEDV cmsDescribeArm Pressure +----------+--------+-------+--------+------------+ Subclavian95                                  +----------+--------+-------+--------+------------+ +---------+--------+--+--------+--+---------+ VertebralPSV cm/s29EDV cm/s11Antegrade +---------+--------+--+--------+--+---------+ Left Carotid Findings: +----------+--------+--------+--------+-----------------------+--------+           PSV cm/sEDV cm/sStenosisDescribe               Comments +----------+--------+--------+--------+-----------------------+--------+ CCA Prox  146     39              smooth and heterogenous         +----------+--------+--------+--------+-----------------------+--------+ CCA Distal85      26              smooth and heterogenous         +----------+--------+--------+--------+-----------------------+--------+ ICA Prox  79      30                                     tortuous +----------+--------+--------+--------+-----------------------+--------+ ICA Distal92      39                                     tortuous +----------+--------+--------+--------+-----------------------+--------+ ECA       78      12                                              +----------+--------+--------+--------+-----------------------+--------+ +----------+--------+--------+--------+------------+ SubclavianPSV cm/sEDV cm/sDescribeArm Pressure  +----------+--------+--------+--------+------------+           107                                  +----------+--------+--------+--------+------------+ +---------+--------+--+--------+--+---------+ VertebralPSV cm/s47EDV cm/s24Antegrade +---------+--------+--+--------+--+---------+  ABI Findings: +--------+------------------+-----+---------+---------------------------------+ Right   Rt Pressure (mmHg)IndexWaveform Comment                           +--------+------------------+-----+---------+---------------------------------+ Brachial                       triphasicUnable to obtain pressure due to                                          IV                                +--------+------------------+-----+---------+---------------------------------+ PTA     96                1.05 triphasic                                  +--------+------------------+-----+---------+---------------------------------+ DP      104               1.14 triphasic                                  +--------+------------------+-----+---------+---------------------------------+ +--------+------------------+-----+---------+-------+  Left    Lt Pressure (mmHg)IndexWaveform Comment +--------+------------------+-----+---------+-------+ Brachial91                     triphasic        +--------+------------------+-----+---------+-------+ PTA     97                1.07 triphasic        +--------+------------------+-----+---------+-------+ DP      96                1.05 triphasic        +--------+------------------+-----+---------+-------+ +-------+---------------+----------------+ ABI/TBIToday's ABI/TBIPrevious ABI/TBI +-------+---------------+----------------+ Right  1.14                            +-------+---------------+----------------+ Left   1.07                            +-------+---------------+----------------+  Right Doppler Findings:  +--------+--------+-----+---------+-----------------------------------+ Site    PressureIndexDoppler  Comments                            +--------+--------+-----+---------+-----------------------------------+ Brachial             triphasicUnable to obtain pressure due to IV +--------+--------+-----+---------+-----------------------------------+ Radial               triphasic                                    +--------+--------+-----+---------+-----------------------------------+ Ulnar                triphasic                                    +--------+--------+-----+---------+-----------------------------------+  Left Doppler Findings: +--------+--------+-----+---------+--------+ Site    PressureIndexDoppler  Comments +--------+--------+-----+---------+--------+ [id number]           triphasic         +--------+--------+-----+---------+--------+ Radial               triphasic         +--------+--------+-----+---------+--------+ Ulnar                triphasic         +--------+--------+-----+---------+--------+  Summary: Right Carotid: Velocities in the right ICA are consistent with a 1-39% stenosis. Left Carotid: Velocities in the left ICA are consistent with a 1-39% stenosis. Vertebrals: Bilateral vertebral arteries demonstrate antegrade flow. Right ABI: Resting right ankle-brachial index is within normal range. No evidence of significant right lower extremity arterial disease. Left ABI: Resting left ankle-brachial index is within normal range. No evidence of significant left lower extremity arterial disease. Right Upper Extremity: Doppler waveforms decrease 50% with right radial compression. Doppler waveforms remain within normal limits with right ulnar compression. Left Upper Extremity: Doppler waveforms decrease 50% with left radial compression. Doppler waveform obliterate with left ulnar compression.  Electronically signed by Curt Jews MD on 10/03/2020 at  2:53:48 PM.    Final (Updated)      Medications:     Scheduled Medications: . aspirin  81 mg Per Tube Daily  . atorvastatin  80 mg Per Tube Daily  . Chlorhexidine Gluconate Cloth  6 each Topical Daily  . docusate  100 mg Per Tube BID  .  insulin aspart  0-20 Units Subcutaneous Q4H  . lisinopril  10 mg Oral Daily  . mouth rinse  15 mL Mouth Rinse BID  . metoprolol tartrate  5 mg Intravenous Once  . metoprolol tartrate  25 mg Oral BID  . pantoprazole (PROTONIX) IV  40 mg Intravenous QHS  . polyethylene glycol  17 g Per Tube Daily  . potassium chloride  40 mEq Per Tube Daily  . sodium chloride flush  10-40 mL Intracatheter Q12H  . spironolactone  12.5 mg Per Tube Daily    Infusions: . sodium chloride Stopped (10/04/20 0538)  . amiodarone 30 mg/hr (10/04/20 0600)  . ceFEPime (MAXIPIME) IV 200 mL/hr at 10/04/20 0600  . dexmedetomidine (PRECEDEX) IV infusion Stopped (10/03/20 1709)  . furosemide    . heparin 1,400 Units/hr (10/04/20 0600)  . nitroGLYCERIN 40 mcg/min (10/04/20 0600)  . propofol (DIPRIVAN) infusion Stopped (10/03/20 1318)    PRN Medications: sodium chloride, acetaminophen (TYLENOL) oral liquid 160 mg/5 mL, fentaNYL (SUBLIMAZE) injection, fentaNYL (SUBLIMAZE) injection, hydrALAZINE, ondansetron (ZOFRAN) IV, polyethylene glycol, sodium chloride flush   Assessment/Plan   1. CAD: NSTEMI last admission, cath showed multivessel CAD (has strong FH of premature CAD as well as DM and HTN). CABG recommended but she left AMA.  She returned with marked dyspnea/pulmonary edema.  HS-TnI peaked at 3190.  ST elevation across precordium. Echo unchanged with EF 35-40%.  I discussed the patient with Dr. Martinique with interventional cardiology and viewed the cath films => she really does not have a good PCI option.  Will plan to stabilize and try to get her to CABG. She denies chest pain this morning. D/w Dr. Orvan Seen. Scheduled for OR tomorrow but will postpone CABG until improved from  respiratory standpoint.  - Continue ASA 81.  - High dose statin.  - Heparin gtt.  - Hold Plavix for CABG.  - Dr. Orvan Seen following.  2. Acute systolic CHF: Echo (7/89) with EF 35-40%, mild LVH (similar this admission and last).  Elevated lactate in setting of hypoxemia prior to intubation.  CXR with pulmonary edema and PNA.  She is volume overloaded on exam.  CVP 13 today, co-ox 70%.  Repeat lactate normal.  - Continue IV Lasix 120 mg bid and give dose of Diuril 250 IV.  - Increase Spironolactone to 25 mg daily.  3. ID: Tm 101.3 with PCT 1.47, WBCs 11.6>>10.  Concern for possible aspiration PNA.  CXR with bilateral infiltrates.  - Continue cefepime per CCM.  - Cultures sent and pending.  4. Acute hypoxemic respiratory failure: Intubated with pulmonary edema and possible PNA. Extubated 2/23. Now on BiPAP - Diuresis.  - Antibiotics.  5. Type 2 diabetes: Control improved recently.  6. VT: Noted in ER though strips not available.  - Continue amiodarone gtt.   Length of Stay: 2  Lyda Jester, PA-C  10/04/2020, 7:45 AM  Advanced Heart Failure Team Pager [phone] (M-F; 7a - 4p)  Please contact [facility] for night-coverage after hours (4p -7a ) and weekends on [email or web address]  Patient seen with PA, agree with the above note.   Worsened respiratory status this morning, now on Bipap.  CXR showed worsening right lung airspace disease, ?mucus plugging.  Tm 101.3, no culture data yet.  CVP 13, Co-ox 70%.  She is on NTG gtt @ 50, heparin gtt.   No chest pain, +dyspnea.   General: On Bipap Neck: JVP 12-14 cm, no thyromegaly or thyroid nodule.  Lungs: Rhonchi bilaterally.  CV: Nondisplaced PMI.  Heart  regular S1/S2, no S3/S4, no murmur.  No peripheral edema.   Abdomen: Soft, nontender, no hepatosplenomegaly, no distention.  Skin: Intact without lesions or rashes.  Neurologic: Alert and oriented x 3.  Psych: Normal affect. Extremities: No clubbing or cyanosis.  HEENT: Normal.   CXR  markedly worse, ?mucus plugging.  CCM following.  - Continue Bipap.  - Cover with vancomycin/cefepime for PNA. Cultures pending.  - Lasix 120 mg IV with Diuril 250 mg IV x 1 this morning. Continue Lasix 80 mg IV bid this afternoon.  - NTG gtt to keep SBP < 130.   Continue heparin gtt, ASA, statin. Will need CABG eventually but must improve respiratory status.   Amiodarone gtt with VT seen initially in ER.   CRITICAL CARE Performed by: Loralie Champagne  Total critical care time: 40 minutes  Critical care time was exclusive of separately billable procedures and treating other patients.  Critical care was necessary to treat or prevent imminent or life-threatening deterioration.  Critical care was time spent personally by me on the following activities: development of treatment plan with patient and/or surrogate as well as nursing, discussions with consultants, evaluation of patient's response to treatment, examination of patient, obtaining history from patient or surrogate, ordering and performing treatments and interventions, ordering and review of laboratory studies, ordering and review of radiographic studies, pulse oximetry and re-evaluation of patient's condition.  Loralie Champagne 10/04/2020 8:47 AM

## 2020-10-05 ENCOUNTER — Inpatient Hospital Stay (HOSPITAL_COMMUNITY): Payer: Medicaid Other

## 2020-10-05 DIAGNOSIS — I5021 Acute systolic (congestive) heart failure: Secondary | ICD-10-CM

## 2020-10-05 LAB — COOXEMETRY PANEL
Carboxyhemoglobin: 1 % (ref 0.5–1.5)
Methemoglobin: 1 % (ref 0.0–1.5)
O2 Saturation: 59.6 %
Total hemoglobin: 8.8 g/dL — ABNORMAL LOW (ref 12.0–16.0)

## 2020-10-05 LAB — CBC
HCT: 27.5 % — ABNORMAL LOW (ref 36.0–46.0)
Hemoglobin: 8.4 g/dL — ABNORMAL LOW (ref 12.0–15.0)
MCH: 27.3 pg (ref 26.0–34.0)
MCHC: 30.5 g/dL (ref 30.0–36.0)
MCV: 89.3 fL (ref 80.0–100.0)
Platelets: 253 10*3/uL (ref 150–400)
RBC: 3.08 MIL/uL — ABNORMAL LOW (ref 3.87–5.11)
RDW: 14 % (ref 11.5–15.5)
WBC: 9.3 10*3/uL (ref 4.0–10.5)
nRBC: 0 % (ref 0.0–0.2)

## 2020-10-05 LAB — RENAL FUNCTION PANEL
Albumin: 2.2 g/dL — ABNORMAL LOW (ref 3.5–5.0)
Anion gap: 14 (ref 5–15)
BUN: 26 mg/dL — ABNORMAL HIGH (ref 6–20)
CO2: 21 mmol/L — ABNORMAL LOW (ref 22–32)
Calcium: 7.9 mg/dL — ABNORMAL LOW (ref 8.9–10.3)
Chloride: 102 mmol/L (ref 98–111)
Creatinine, Ser: 1.07 mg/dL — ABNORMAL HIGH (ref 0.44–1.00)
GFR, Estimated: >60 mL/min (ref >60)
Glucose, Bld: 144 mg/dL — ABNORMAL HIGH (ref 70–99)
Phosphorus: 3.2 mg/dL (ref 2.5–4.6)
Potassium: 3.5 mmol/L (ref 3.5–5.1)
Sodium: 137 mmol/L (ref 135–145)

## 2020-10-05 LAB — GLUCOSE, CAPILLARY
Glucose-Capillary: 101 mg/dL — ABNORMAL HIGH (ref 70–99)
Glucose-Capillary: 122 mg/dL — ABNORMAL HIGH (ref 70–99)
Glucose-Capillary: 126 mg/dL — ABNORMAL HIGH (ref 70–99)
Glucose-Capillary: 149 mg/dL — ABNORMAL HIGH (ref 70–99)
Glucose-Capillary: 154 mg/dL — ABNORMAL HIGH (ref 70–99)
Glucose-Capillary: 194 mg/dL — ABNORMAL HIGH (ref 70–99)

## 2020-10-05 LAB — HEPARIN LEVEL (UNFRACTIONATED): Heparin Unfractionated: 0.25 IU/mL — ABNORMAL LOW (ref 0.30–0.70)

## 2020-10-05 MED ORDER — ATORVASTATIN CALCIUM 80 MG PO TABS
80.0000 mg | ORAL_TABLET | Freq: Every day | ORAL | Status: DC
  Administered 2020-10-05 – 2020-10-08 (×4): 80 mg via ORAL
  Filled 2020-10-05 (×4): qty 1

## 2020-10-05 MED ORDER — SPIRONOLACTONE 25 MG PO TABS: 25.0000 mg | ORAL_TABLET | Freq: Every day | ORAL | Status: DC

## 2020-10-05 MED ORDER — ISOSORB DINITRATE-HYDRALAZINE 20-37.5 MG PO TABS
0.5000 | ORAL_TABLET | Freq: Three times a day (TID) | ORAL | Status: DC
  Administered 2020-10-05 – 2020-10-07 (×9): 0.5 via ORAL
  Filled 2020-10-05 (×10): qty 1

## 2020-10-05 MED ORDER — POTASSIUM CHLORIDE CRYS ER 20 MEQ PO TBCR
40.0000 meq | EXTENDED_RELEASE_TABLET | Freq: Three times a day (TID) | ORAL | Status: AC
  Administered 2020-10-05 (×2): 40 meq via ORAL
  Filled 2020-10-05 (×2): qty 2

## 2020-10-05 MED ORDER — ASPIRIN 81 MG PO CHEW
81.0000 mg | CHEWABLE_TABLET | Freq: Every day | ORAL | Status: DC
  Administered 2020-10-05 – 2020-10-08 (×4): 81 mg via ORAL
  Filled 2020-10-05 (×4): qty 1

## 2020-10-05 MED ORDER — ACETAMINOPHEN 325 MG PO TABS: 650.0000 mg | ORAL_TABLET | Freq: Four times a day (QID) | ORAL | Status: DC | PRN

## 2020-10-05 MED ORDER — METOLAZONE 2.5 MG PO TABS
2.5000 mg | ORAL_TABLET | Freq: Once | ORAL | Status: AC
  Administered 2020-10-05: 2.5 mg via ORAL
  Filled 2020-10-05: qty 1

## 2020-10-05 MED ORDER — SPIRONOLACTONE 25 MG PO TABS
25.0000 mg | ORAL_TABLET | Freq: Every day | ORAL | Status: DC
  Administered 2020-10-05 – 2020-10-08 (×4): 25 mg via ORAL
  Filled 2020-10-05 (×4): qty 1

## 2020-10-05 MED ORDER — POTASSIUM CHLORIDE CRYS ER 20 MEQ PO TBCR
40.0000 meq | EXTENDED_RELEASE_TABLET | Freq: Every day | ORAL | Status: DC
  Administered 2020-10-05 – 2020-10-08 (×5): 40 meq via ORAL
  Filled 2020-10-05 (×4): qty 2

## 2020-10-05 MED ORDER — POTASSIUM CHLORIDE 20 MEQ PO PACK
40.0000 meq | PACK | Freq: Every day | ORAL | Status: DC
  Filled 2020-10-05 (×2): qty 2

## 2020-10-05 MED ORDER — DOCUSATE SODIUM 50 MG/5ML PO LIQD
100.0000 mg | Freq: Two times a day (BID) | ORAL | Status: DC
  Administered 2020-10-05 – 2020-10-08 (×6): 100 mg via ORAL
  Filled 2020-10-05 (×9): qty 10

## 2020-10-05 NOTE — Progress Notes (Signed)
[facility] for Heparin Indication: chest pain/ACS  Allergies  Allergen Reactions  . Penicillins Rash    Historical Has patient had a PCN reaction causing immediate rash, facial/tongue/throat swelling, SOB or lightheadedness with hypotension: No Has patient had a PCN reaction causing severe rash involving mucus membranes or skin necrosis: No Has patient had a PCN reaction that required hospitalization: No Has patient had a PCN reaction occurring within the last 10 years: No If all of the above answers are "NO", then may proceed with Cephalosporin use.    Patient Measurements: Weight: 113.9 kg (251 lb 1.7 oz) Heparin Dosing Weight: 80.6 kg  Vital Signs: Temp: 99.4 F (37.4 C) (02/25 1100) Temp Source: Oral (02/25 0335) BP: 138/86 (02/25 1300) Pulse Rate: 103 (02/25 1300)  Labs: Recent Labs    10/02/20 1618 10/02/20 2252 10/03/20 7322 10/03/20 0318 10/03/20 0254 10/04/20 0232 10/04/20 0442 10/05/20 0331 10/05/20 0352 10/05/20 0353  HGB  --   --    < > 9.0* 8.6* 10.5*  --   --  8.4*  --   HCT  --   --    < > 25.5* 26.8* 35.3*  --   --  27.5*  --   PLT  --   --    < > 249 251 311  --   --  253  --   HEPARINUNFRC  --   --   --  0.40  --   --  0.40  --   --  0.25*  CREATININE  --   --   --  0.95 0.91 1.00  --  1.07*  --   --   TROPONINIHS 3,190* 2,868*  --   --   --   --   --   --   --   --    < > = values in this interval not displayed.    Estimated Creatinine Clearance: 83 mL/min (A) (by C-G formula based on SCr of 1.07 mg/dL (H)).   Medical History: Past Medical History:  Diagnosis Date  . Coronary artery disease   . Diabetes mellitus without complication ([place])   . Diabetic neuropathy ([place]) 02/2020  . Hyperlipidemia age 89  . Hypertension age 28  . Hypocalcemia 11/2019  . Hypokalemia 11/2019  . Iron deficiency anemia   . Obesity   . Proteinuria 12/09/2019    Medications:  Infusions:  . sodium chloride Stopped  (10/04/20 2311)  . amiodarone 30 mg/hr (10/05/20 0347)  . ceFEPime (MAXIPIME) IV 2 g (10/05/20 0624)  . dexmedetomidine (PRECEDEX) IV infusion 0.2 mcg/kg/hr (10/05/20 0200)  . heparin 1,400 Units/hr (10/05/20 0254)  . nitroGLYCERIN Stopped (10/04/20 1017)    Assessment: 45 y.o. female admitted with respiratory distress, intubated in the ED. Patient recently admitted for NSTEMI, found to have severe multivessel CAD, left AMA 09/27/20 after refusing CABG. Pt was also offered high risk PCI which was refused as well. Pharmacy has been consulted to start heparin. Plans are for CABG when more stable.   Heparin level trended down to 0.25, will increase to 1500 to keep at low end of goal. Hgb has been between 8.4 today and 10s depending on the day. Plt ok. No bleeding issues noted.   Pneumonia - off vancomycin today, no changes indicated to cefepime. Still febrile, wbc wnl.   Goal of Therapy:  Heparin level 0.3-0.7 units/ml Monitor platelets by anticoagulation protocol: Yes   Plan:  Increase heparin to 1500 units/h Daily heparin level and  CBC Cefepime 2g q8 hours  Erin Hearing PharmD., BCPS Clinical Pharmacist 10/05/2020 1:23 PM

## 2020-10-05 NOTE — Progress Notes (Signed)
Patient refused Bipap tonight. Pt stable on 6L [place]. RT will continue to monitor.

## 2020-10-05 NOTE — Progress Notes (Addendum)
NAME:  Tiffany Le, MRN:  [id number], DOB:  14-May-1976, LOS: 3 ADMISSION DATE:  10/02/2020, CONSULTATION DATE:  10/02/2020 REFERRING MD:  Tiffany Le, CHIEF COMPLAINT:  Respiratory distress   Brief History:  45 year old female who presented via EMS for acute onset of SOB and hypoxia with desats to 50s on BiPAP requiring intubation.  Recently admitted 2/14-2/17 for SOB/NSTEMI with LHC demonstrating severe CAD. Recommended for CABG vs. PCI, refused and signed out AMA.   History of Present Illness:  Tiffany Le is a 45 year old female with PMHx significant for HTN, HLD, T2DM, CAD (severe, LHC 0/96/28), acute systolic/diastolic HF (LVEF 36-62% 9/47/65). Patient was recently admitted to [facility] 2/14 - 2/17 for SOB, found to have elevated troponins and NSTEMI. During this admission, LHC was completed demonstrating severe multivessel CAD with recommendation for CABG vs. PCI. Patient reportedly was concerned about undergoing inpatient procedure and refused at that time, signing out AMA. Discharged on ASA/BB/statin.  Patient was brought in by EMS today for respiratory distress. Reportedly experienced acute onset of SOB and on EMS arrival with increasing somnolence and was saturating in the 50s despite BiPAP administration. She was subsequently intubated in the ER with improvement in saturations.  On arrival to ED patient was tachycardic, post-intubation O2 sats 82-91%, ABG 7.26/48/57/21, WBC 11.6, stable Hgb 11.3, K 3.3, Bicarb 18, Cr 0.96. Lactate was elevated at 6.3 with trop 94 (926 1 week prior during NSTEMI). COVID was negative. PCCM was consulted for admission and further workup.  Past Medical History:   Past Medical History:  Diagnosis Date  . Coronary artery disease   . Diabetes mellitus without complication ([place])   . Diabetic neuropathy ([place]) 02/2020  . Hyperlipidemia age 89  . Hypertension age 30  . Hypocalcemia 11/2019  . Hypokalemia 11/2019  . Iron deficiency anemia   . Obesity   . Proteinuria  12/09/2019   Significant Hospital Events:  2/22 Admitted for SOB, hypoxia and respiratory distress  Consults:  HF/ TCTS   Procedures:  ETT 2/22 > 2/23 RUE PICC 2/22 >>  Significant Diagnostic Tests:   2/22 CXR >> Endotracheal tube tip terminates in the mid to lower trachea, 2cm from the carina. Interval development of dense consolidative opacity within both lungs, most pronounced in the right lung and in the left mid lung, possible latering L pleural fluid  2/22 TTE >> 1. Left ventricular ejection fraction, by estimation, is 35 to 40%. The  left ventricle has moderately decreased function. The left ventricle  demonstrates regional wall motion abnormalities (see scoring  diagram/findings for description). There is mild  concentric left ventricular hypertrophy. Indeterminate diastolic filling  due to E-A fusion.  2. Right ventricular systolic function was not well visualized. The right  ventricular size is not well visualized.  3. Left atrial size was mild to moderately dilated.  4. The mitral valve is grossly normal. Trivial mitral valve  regurgitation. No evidence of mitral stenosis.  5. The aortic valve is grossly normal. Aortic valve regurgitation is not  visualized. No aortic stenosis is present.  Comparison(s): No significant change from prior study.  Micro Data:  2/22 COVID/ flu >> negative 2/22 BCx >> 2/22 Resp Cx >> 2/22 MRSA >> neg  Antimicrobials:  Cefepime 2/22 >> Vanc 2/22 >> 2/23  Interim History / Subjective:  Patient on BiPAP o/n. Noted to be on 20L HFNC this AM  Notes improvement in breathing. Would like something to eat.   Objective   Blood pressure 127/86, pulse 97, temperature 100.2 F (  37.9 C), resp. rate (!) 22, weight 113.9 kg, SpO2 99 %. CVP:  [12 mmHg-17 mmHg] 12 mmHg  FiO2 (%):  [60 %-80 %] 60 %   Intake/Output Summary (Last 24 hours) at 10/05/2020 0914 Last data filed at 10/05/2020 0344 Gross per 24 hour  Intake 1572.78 ml  Output  2480 ml  Net -907.22 ml   Filed Weights   10/03/20 0500 10/04/20 0515 10/05/20 0344  Weight: 115.8 kg 111.5 kg 113.9 kg    Examination: General:  Chronically and acutely ill appearing appearing middle aged F, on HFNC, no acute distress  HEENT: NCAT pink mm anicteric sclera trachea midline Neuro: awake, AOx4, PERRL, no focal deficits CV: tachycardic rate rr. S1s2. Cap refill brisk  PULM:  Diffuse crackles, R>L bibasilar crackles  GI: Obese soft ndnt + bowel sounds  Extremities: c/d/w. No acute joint deformity, no cyanosis. Acrylic fingernails  Skin: c/d/w without rash   Resolved Hospital Problem list     Assessment & Plan:  Acute respiratory failure with hypoxia Acute HFrEF exacerbation Likely 2/2 acute cardiogenic pulm edema, possible HCAP, component of atelectasis, pleural effusion. On BiPAP intermittently; currently on HFNC. Echo on 2/22 with EF 35-40%. Diuresis of ~7L UOP since admission (net -2L). She is still hypervolemic on exam.  CXR with RUL infiltrate. Possible aspiration PNA for which she is on cefepime. Leukocytosis improving but did have Tmax 101.42F overnight. Cultures neg to date.  P - Lasix inc to 160mg  with metolazone 2.5mg  today  - Spironolactone 25mg  daily  - BiDil 0.5 tid added  - Continue cefepime day 4 - Strict I&O - Monitor and replete electrolytes prn - BiPAP prn   Severe multivessel disease Ischemic cardiomyopathy NSTEMI on prior admission and significant multivessel CAD on cath -(mLAD 95%, 70% bifurcation high OM1 95%, bifurcation Lcx-OM2 65-70% and Le caliber codominant RCA with 85% stenosis)  Patient will likely need CABG.  - Continue aspirin, statin - Continue heparin gtt - Possible CABG next week  At risk for AKI  P - diuresis as above - trend renal indices, strict I/O  - Avoid nephrotoxic agents, ensure adequate renal perfusion  VTach, improved Noted in ER. She is currently on amiodarone gtt with NSR. Can consider switching to oral.   - Continue amiodarone gtt  - goal K > 4; Mag >2  DM - Holding home Glipizide, Lantus. P - SSI resistant, remains controlled on q 4 CBG  Hx diabetic neuropathy. - Holding home Gabapentin, Cyclobenzaprine.   Best practice (evaluated daily)  Diet: CLD Pain/Anxiety/Delirium protocol (if indicated): Per protocol VAP protocol (if indicated): N/A DVT prophylaxis: heparin gtt  GI prophylaxis: PPI Glucose control: SSI Mobility: OOB as tolerated Disposition: Progressive  ADDENDUM: Patient is stable from pulmonary/critical care perspective. She is diuresing well and appropriate for transfer to Progressive Care. She is to continue diuresis for her HFrEF exacerbation and goal is for CABG next week. Discussed with Dr. Aundra Dubin who agreed to take over as primary service for this patient.   Goals of Care:  Last date of multidisciplinary goals of care discussion: Pending Family and staff present:  Summary of discussion:  Follow up goals of care discussion due: 2/29 Code Status: Full  CRITICAL CARE Total critical care time: 41 minutes  Harvie Heck, MD Internal Medicine, PGY-2 10/05/20 9:15 AM Pager # [phone]    Pulmonary critical care attending:  This is a 45 year old female admitted to the hospital with severe coronary disease initially was in the hospital for an NSTEMI left AMA  comes back in respiratory failure was intubated.  Now extubated and awaiting plans for in-hospital CABG by Dr. Julien Girt.  She has significant multivessel coronary disease and recent NSTEMI currently on aspirin statin and heparin infusion.  Acute systolic heart failure undergoing diuresis on Lasix plus metolazone plus spironolactone.  BP (!) 147/93   Pulse (!) 106   Temp 98.7 F (37.1 C) (Oral)   Resp (!) 28   Wt 113.9 kg   SpO2 96%   BMI 43.10 kg/m   General: Obese female on heated high flow nasal cannula Heart: Regular rate rhythm S1-S2 Lungs: Clear to auscultation bilaterally no crackles no  wheeze Abdomen: Mildly distended  Labs: Reviewed  Assessment: Acute hypoxemic respiratory failure on high flow nasal cannula Acute systolic heart failure with reduced ejection fraction Severe multivessel coronary artery disease Ischemic cardiomyopathy Recent NSTEMI please see documented cath report noted above. V. tach Diabetes type 2  Plan:  Continue diuresis with Lasix plus spironolactone plus metolazone Remains on antibiotics Goal-directed heart failure regimen per heart failure service. Continue aspirin statin plus heparin infusion Avoid nephrotoxic agents Continue diabetic regimen. We appreciate the heart failure service taking patient as a transfer to telemetry floor. Patient is awaiting inpatient coronary artery bypass grafting  Pulmonary critical care will sign off at this time.  [facility] 10/05/2020 5:27 PM

## 2020-10-05 NOTE — Evaluation (Signed)
Physical Therapy Evaluation Patient Details Name: Tiffany Le MRN: [id number] DOB: 16-Oct-1975 Today's Date: 10/05/2020   History of Present Illness  Pt is a 45 y.o. female recently admitted 2/14-2/17/22 with NSTEMI and LHC with severe CAD, recommended CABG vs. PCI, but left AMA, pt now readmitted 10/02/20 with respiratory distress; ETT 2/22-2/23. Workup for acute hypoxic respiratory failure likely secondary to acute cardiogenic pulmonary edema, possible HCAP, pleural effusion. Plan for CABG next week. PMH includes HTN, DM2, CAD, HF.    Clinical Impression  Pt presents with an overall decrease in functional mobility secondary to above. PTA, pt independent and lives alone; have multiple supportive family members who plan to provide assist at d/c. Pt preparing for CABG next week; initiated educ re: sternal precautions and importance of mobility. Today, pt able to initiate transfer training, reliant on UE support and modA to stand; heavy reliance on UE support to push into standing (which pt will not be able to do post-op). Pt would benefit from continued acute PT services to maximize functional mobility and independence. Pt hopeful for return home with family support and HHPT services; educ on potential need for post-acute rehab at SNF prior to return home; pt reports understanding.  HR 106 SpO2 >/94% on 6L O2 [place] Post-transfer BP 147/97    Follow Up Recommendations Home health PT;Supervision for mobility/OOB (pending progress post-op)    Equipment Recommendations   (TBD - rollator vs. RW)    Recommendations for Other Services       Precautions / Restrictions Precautions Precautions: Fall;Other (comment) Precaution Comments: Initiated educ on sternal precautions (did not provide handout yet) in prep for CABG next week Restrictions Weight Bearing Restrictions: No      Mobility  Bed Mobility Overal bed mobility: Needs Assistance Bed Mobility: Supine to Sit     Supine to sit:  Supervision;HOB elevated     General bed mobility comments: Increased time and effort with intermittent rest breaks secondary to fatigue/SOB    Transfers Overall transfer level: Needs assistance Equipment used: 1 person hand held assist;Rolling walker (2 wheeled) Transfers: Sit to/from [facility] Sit to Stand: Mod assist Stand pivot transfers: Min assist       General transfer comment: ModA for HHA to elevate trunk into standing; pivotal steps to recliner with HHA and other UE support on RW with minA for stability; further mobility limited by fatigue, SOB and c/o dizziness  Ambulation/Gait                Stairs            Wheelchair Mobility    Modified Rankin (Stroke Patients Only)       Balance Overall balance assessment: Needs assistance   Sitting balance-Leahy Scale: Fair       Standing balance-Leahy Scale: Poor                               Pertinent Vitals/Pain Pain Assessment: Faces Faces Pain Scale: Hurts a little bit Pain Location: sacrum/buttocks Pain Descriptors / Indicators: Sore Pain Intervention(s): Monitored during session;Repositioned    Home Living Family/patient expects to be discharged to:: Private residence Living Arrangements: Alone Available Help at Discharge: Family;Available 24 hours/day Type of Home: Apartment Home Access: Stairs to enter Entrance Stairs-Rails: Right Entrance Stairs-Number of Steps: 16 Home Layout: One level Home Equipment: Bedside commode;Shower seat Additional Comments: Plans to borrow shower seat and BSC (to put over toilet) from friends  Prior Function Level of Independence: Independent               Hand Dominance        Extremity/Trunk Assessment   Upper Extremity Assessment Upper Extremity Assessment: Generalized weakness    Lower Extremity Assessment Lower Extremity Assessment: Generalized weakness       Communication   Communication: No  difficulties  Cognition Arousal/Alertness: Awake/alert Behavior During Therapy: WFL for tasks assessed/performed Overall Cognitive Status: Within Functional Limits for tasks assessed                                        General Comments General comments (skin integrity, edema, etc.): Daughter (67 y.o.) present and supportive. SpO2 >/94% on 6L O2 HFNC; HR 106; c/o dizziness with standing, post-transfer BP 147/97    Exercises     Assessment/Plan    PT Assessment Patient needs continued PT services  PT Problem List Decreased strength;Decreased activity tolerance;Decreased balance;Decreased mobility;Decreased knowledge of use of DME;Decreased knowledge of precautions;Cardiopulmonary status limiting activity;Obesity       PT Treatment Interventions DME instruction;Gait training;Stair training;Functional mobility training;Therapeutic activities;Therapeutic exercise;Balance training;Patient/family education    PT Goals (Current goals can be found in the Care Plan section)  Acute Rehab PT Goals Patient Stated Goal: Home with family's support at d/c PT Goal Formulation: With patient/family Time For Goal Achievement: 10/19/20 Potential to Achieve Goals: Good    Frequency Min 3X/week   Barriers to discharge        Co-evaluation               AM-PAC PT "6 Clicks" Mobility  Outcome Measure Help needed turning from your back to your side while in a flat bed without using bedrails?: A Little Help needed moving from lying on your back to sitting on the side of a flat bed without using bedrails?: A Little Help needed moving to and from a bed to a chair (including a wheelchair)?: A Lot Help needed standing up from a chair using your arms (e.g., wheelchair or bedside chair)?: A Lot Help needed to walk in hospital room?: A Lot Help needed climbing 3-5 steps with a railing? : A Lot 6 Click Score: 14    End of Session Equipment Utilized During Treatment:  Oxygen Activity Tolerance: Patient tolerated treatment well;Patient limited by fatigue Patient left: in chair;with call bell/phone within reach;with family/visitor present Nurse Communication: Mobility status (RN reports no chair alarm needed) PT Visit Diagnosis: Other abnormalities of gait and mobility (R26.89);Muscle weakness (generalized) (M62.81)    Time: 5732-2025 PT Time Calculation (min) (ACUTE ONLY): 26 min   Charges:   PT Evaluation $PT Eval Moderate Complexity: 1 Mod PT Treatments $Therapeutic Activity: 8-22 mins    Mabeline Caras, PT, DPT Acute Rehabilitation Services  Pager [phone] Office [place] 10/05/2020, 5:27 PM

## 2020-10-05 NOTE — Progress Notes (Signed)
Patient ID: Tiffany Le, female   DOB: 1975-12-11, 45 y.o.   MRN: [id number]    Advanced Heart Failure Rounding Note  PCP-Cardiologist: Quay Burow, MD   Subjective:    Doing better today.  CXR improved, still has RUL infiltrate.  CVP 14, I/Os negative yesterday.  Now off Bipap and on HFNC.    Tm 101.8, WBCs 9.3.  Remains on vancomycin/cefepime for PNA.   Objective:   Weight Range: 113.9 kg Body mass index is 43.1 kg/m.   Vital Signs:   Temp:  [97.2 F (36.2 C)-101.8 F (38.8 C)] 100.2 F (37.9 C) (02/25 0725) Pulse Rate:  [95-117] 97 (02/25 0348) Resp:  [19-35] 22 (02/25 0348) BP: (84-142)/(58-98) 127/86 (02/25 0200) SpO2:  [93 %-100 %] 99 % (02/25 0348) FiO2 (%):  [60 %-80 %] 60 % (02/25 0725) Weight:  [113.9 kg] 113.9 kg (02/25 0344) Last BM Date: 10/03/20  Weight change: Filed Weights   10/03/20 0500 10/04/20 0515 10/05/20 0344  Weight: 115.8 kg 111.5 kg 113.9 kg    Intake/Output:   Intake/Output Summary (Last 24 hours) at 10/05/2020 0832 Last data filed at 10/05/2020 0344 Gross per 24 hour  Intake 1671.91 ml  Output 2780 ml  Net -1108.09 ml      Physical Exam    CVP 14 General: NAD Neck: JVP 12-14 cm, no thyromegaly or thyroid nodule.  Lungs: Crackles on right. CV: Nondisplaced PMI.  Heart regular S1/S2, no S3/S4, no murmur.  No peripheral edema.   Abdomen: Soft, nontender, no hepatosplenomegaly, no distention.  Skin: Intact without lesions or rashes.  Neurologic: Alert and oriented x 3.  Psych: Normal affect. Extremities: No clubbing or cyanosis.  HEENT: Normal.    Telemetry   NSR 90s-100s, personally reviewed.    Labs    CBC Recent Labs    10/04/20 0232 10/05/20 0352  WBC 10.5 9.3  HGB 10.5* 8.4*  HCT 35.3* 27.5*  MCV 90.3 89.3  PLT 311 660   Basic Metabolic Panel Recent Labs    10/03/20 0318 10/03/20 0852 10/04/20 0232 10/05/20 0331  NA 131*   < > 138 137  K 4.3   < > 4.2 3.5  CL 99   < > 106 102  CO2 19*   < >  20* 21*  GLUCOSE 243*   < > 216* 144*  BUN 17   < > 22* 26*  CREATININE 0.95   < > 1.00 1.07*  CALCIUM 7.4*   < > 8.1* 7.9*  MG 1.7  --  2.4  --   PHOS 3.0   < > 4.9* 3.2   < > = values in this interval not displayed.   Liver Function Tests Recent Labs    10/04/20 0232 10/05/20 0331  ALBUMIN 2.7* 2.2*   No results for input(s): LIPASE, AMYLASE in the last 72 hours. Cardiac Enzymes No results for input(s): CKTOTAL, CKMB, CKMBINDEX, TROPONINI in the last 72 hours.  BNP: BNP (last 3 results) Recent Labs    10/02/20 0516  BNP 947.1*    ProBNP (last 3 results) No results for input(s): PROBNP in the last 8760 hours.   D-Dimer No results for input(s): DDIMER in the last 72 hours. Hemoglobin A1C No results for input(s): HGBA1C in the last 72 hours. Fasting Lipid Panel Recent Labs    10/03/20 0852  TRIG 57   Thyroid Function Tests No results for input(s): TSH, T4TOTAL, T3FREE, THYROIDAB in the last 72 hours.  Invalid input(s): FREET3  Other  results:   Imaging    DG CHEST PORT 1 VIEW  Result Date: 10/05/2020 CLINICAL DATA:  Hypoxia EXAM: PORTABLE CHEST 1 VIEW COMPARISON:  Radiograph 10/04/2020 FINDINGS: Right upper extremity PICC tip terminates in the mid SVC. Telemetry leads overlie the chest. Additional external support devices are seen. Persistent dense opacity remains in the right upper lobe with associated air bronchograms. There is diminished opacity in the right lung base. Some stable slightly diminished opacities seen in the left lung base as well. Bilateral pleural effusions. Stable cardiomediastinal contours. No acute osseous or soft tissue abnormality. IMPRESSION: Persistent dense opacity in the right upper lobe with associated air bronchograms. Interval clearing of the right lower lobe with some slightly diminished opacity in the left base as well. Bilateral effusions. Electronically Signed   By: Lovena Le M.D.   On: 10/05/2020 06:13     Medications:      Scheduled Medications: . aspirin  81 mg Per Tube Daily  . atorvastatin  80 mg Per Tube Daily  . chlorhexidine  15 mL Mouth Rinse BID  . Chlorhexidine Gluconate Cloth  6 each Topical Daily  . docusate  100 mg Per Tube BID  . furosemide  80 mg Intravenous BID  . insulin aspart  0-20 Units Subcutaneous Q4H  . isosorbide-hydrALAZINE  0.5 tablet Oral TID  . mouth rinse  15 mL Mouth Rinse BID  . mouth rinse  15 mL Mouth Rinse q12n4p  . metolazone  2.5 mg Oral Once  . metoprolol tartrate  5 mg Intravenous Once  . pantoprazole (PROTONIX) IV  40 mg Intravenous QHS  . polyethylene glycol  17 g Per Tube Daily  . potassium chloride  40 mEq Per Tube Daily  . sodium chloride flush  10-40 mL Intracatheter Q12H  . spironolactone  25 mg Per Tube Daily    Infusions: . sodium chloride Stopped (10/04/20 2311)  . amiodarone 30 mg/hr (10/05/20 0347)  . ceFEPime (MAXIPIME) IV 2 g (10/05/20 0624)  . dexmedetomidine (PRECEDEX) IV infusion 0.2 mcg/kg/hr (10/05/20 0200)  . heparin 1,400 Units/hr (10/05/20 0254)  . nitroGLYCERIN Stopped (10/04/20 1017)  . vancomycin Stopped (10/04/20 2240)    PRN Medications: sodium chloride, acetaminophen (TYLENOL) oral liquid 160 mg/5 mL, hydrALAZINE, levalbuterol, ondansetron (ZOFRAN) IV, polyethylene glycol, sodium chloride flush   Assessment/Plan   1. CAD: NSTEMI last admission, cath showed multivessel CAD (has strong FH of premature CAD as well as DM and HTN). CABG recommended but she left AMA.  She returned with marked dyspnea/pulmonary edema.  HS-TnI peaked at 3190.  ST elevation across precordium. Echo unchanged with EF 35-40%.  I discussed the patient with Dr. [place] with interventional cardiology and viewed the cath films => she really does not have a good PCI option.  Will plan to stabilize and try to get her to CABG. She denies chest pain this morning. She is now agreeable to CABG. - Continue ASA 81.  - High dose statin.  - Heparin gtt.  - Hold Plavix  for CABG.  - Dr. Orvan Seen following, hopefully CABG next week.  2. Acute systolic CHF: Echo (7/25) with EF 35-40%, mild LVH (similar this admission and last).  Elevated lactate in setting of hypoxemia prior to intubation.  CXR with pulmonary edema and PNA, improving.  She is volume overloaded on exam.  CVP 14 today, co-ox 60%.  - Lasix 80 mg IV bid today with metolazone 2.5 x 1 and replace K.   - Increase Spironolactone to 25 mg daily.  -  Add Bidil 0.5 tab tid (avoid starting ARB/ARNI pre-CABG, will need after).  3. ID: Tm 101.8 with last PCT 1.47, WBCs 11.6>>10>>9.  Concern for possible aspiration PNA.  CXR with bilateral infiltrates initially, now clearing with RUL infiltrate only (suspect she asymmetric edema or mucus plugging also).  - Continue cefepime/vancomycin per CCM.  - Cultures NGTD 4. Acute hypoxemic respiratory failure: Intubated with pulmonary edema and possible PNA. Extubated 2/23. Now off Bipap and on HFNC.  - Diuresis.  - Antibiotics.  5. Type 2 diabetes: Control improved recently.  6. VT: Noted in ER though strips not available.  - Continue amiodarone gtt.   CRITICAL CARE Performed by: Loralie Champagne  Total critical care time: 40 minutes  Critical care time was exclusive of separately billable procedures and treating other patients.  Critical care was necessary to treat or prevent imminent or life-threatening deterioration.  Critical care was time spent personally by me on the following activities: development of treatment plan with patient and/or surrogate as well as nursing, discussions with consultants, evaluation of patient's response to treatment, examination of patient, obtaining history from patient or surrogate, ordering and performing treatments and interventions, ordering and review of laboratory studies, ordering and review of radiographic studies, pulse oximetry and re-evaluation of patient's condition.  Loralie Champagne 10/05/2020 8:32 AM

## 2020-10-06 DIAGNOSIS — I5021 Acute systolic (congestive) heart failure: Secondary | ICD-10-CM

## 2020-10-06 DIAGNOSIS — I214 Non-ST elevation (NSTEMI) myocardial infarction: Secondary | ICD-10-CM

## 2020-10-06 LAB — HEPARIN LEVEL (UNFRACTIONATED)
Heparin Unfractionated: 0.25 IU/mL — ABNORMAL LOW (ref 0.30–0.70)
Heparin Unfractionated: 0.4 IU/mL (ref 0.30–0.70)

## 2020-10-06 LAB — CBC
HCT: 25.9 % — ABNORMAL LOW (ref 36.0–46.0)
Hemoglobin: 8.3 g/dL — ABNORMAL LOW (ref 12.0–15.0)
MCH: 27.4 pg (ref 26.0–34.0)
MCHC: 32 g/dL (ref 30.0–36.0)
MCV: 85.5 fL (ref 80.0–100.0)
Platelets: 264 10*3/uL (ref 150–400)
RBC: 3.03 MIL/uL — ABNORMAL LOW (ref 3.87–5.11)
RDW: 13.7 % (ref 11.5–15.5)
WBC: 7.9 10*3/uL (ref 4.0–10.5)
nRBC: 0 % (ref 0.0–0.2)

## 2020-10-06 LAB — RENAL FUNCTION PANEL
Albumin: 2.2 g/dL — ABNORMAL LOW (ref 3.5–5.0)
Anion gap: 9 (ref 5–15)
BUN: 27 mg/dL — ABNORMAL HIGH (ref 6–20)
CO2: 24 mmol/L (ref 22–32)
Calcium: 8 mg/dL — ABNORMAL LOW (ref 8.9–10.3)
Chloride: 101 mmol/L (ref 98–111)
Creatinine, Ser: 0.9 mg/dL (ref 0.44–1.00)
GFR, Estimated: >60 mL/min (ref >60)
Glucose, Bld: 144 mg/dL — ABNORMAL HIGH (ref 70–99)
Phosphorus: 1.9 mg/dL — ABNORMAL LOW (ref 2.5–4.6)
Potassium: 3.8 mmol/L (ref 3.5–5.1)
Sodium: 134 mmol/L — ABNORMAL LOW (ref 135–145)

## 2020-10-06 LAB — GLUCOSE, CAPILLARY
Glucose-Capillary: 144 mg/dL — ABNORMAL HIGH (ref 70–99)
Glucose-Capillary: 156 mg/dL — ABNORMAL HIGH (ref 70–99)
Glucose-Capillary: 169 mg/dL — ABNORMAL HIGH (ref 70–99)
Glucose-Capillary: 173 mg/dL — ABNORMAL HIGH (ref 70–99)
Glucose-Capillary: 217 mg/dL — ABNORMAL HIGH (ref 70–99)
Glucose-Capillary: 234 mg/dL — ABNORMAL HIGH (ref 70–99)

## 2020-10-06 LAB — MAGNESIUM: Magnesium: 2.4 mg/dL (ref 1.7–2.4)

## 2020-10-06 LAB — COOXEMETRY PANEL
Carboxyhemoglobin: 1.1 % (ref 0.5–1.5)
Methemoglobin: 0.7 % (ref 0.0–1.5)
O2 Saturation: 68.7 %
Total hemoglobin: 8.2 g/dL — ABNORMAL LOW (ref 12.0–16.0)

## 2020-10-06 MED ORDER — POTASSIUM CHLORIDE CRYS ER 20 MEQ PO TBCR
40.0000 meq | EXTENDED_RELEASE_TABLET | Freq: Once | ORAL | Status: AC
  Filled 2020-10-06: qty 2

## 2020-10-06 MED ORDER — PANTOPRAZOLE SODIUM 40 MG PO TBEC
40.0000 mg | DELAYED_RELEASE_TABLET | Freq: Every day | ORAL | Status: DC
  Administered 2020-10-06 – 2020-10-08 (×3): 40 mg via ORAL
  Filled 2020-10-06 (×3): qty 1

## 2020-10-06 MED ORDER — ALTEPLASE 2 MG IJ SOLR: 2.0000 mg | Freq: Once | INTRAMUSCULAR | Status: DC

## 2020-10-06 MED ORDER — HYDROXYZINE HCL 25 MG PO TABS
25.0000 mg | ORAL_TABLET | Freq: Three times a day (TID) | ORAL | Status: DC | PRN
  Administered 2020-10-06: 25 mg via ORAL
  Filled 2020-10-06: qty 1

## 2020-10-06 MED ORDER — INSULIN ASPART 100 UNIT/ML ~~LOC~~ SOLN
0.0000 [IU] | Freq: Three times a day (TID) | SUBCUTANEOUS | Status: DC
  Administered 2020-10-07 (×2): 3 [IU] via SUBCUTANEOUS
  Administered 2020-10-07: 5 [IU] via SUBCUTANEOUS
  Administered 2020-10-08: 3 [IU] via SUBCUTANEOUS
  Administered 2020-10-08: 8 [IU] via SUBCUTANEOUS
  Administered 2020-10-08: 3 [IU] via SUBCUTANEOUS

## 2020-10-06 NOTE — Progress Notes (Signed)
Pt refused bipap tonight.

## 2020-10-06 NOTE — Evaluation (Signed)
Occupational Therapy Evaluation Patient Details Name: Tiffany Le MRN: [id number] DOB: 03/14/1976 Today's Date: 10/06/2020    History of Present Illness Pt is a 45 y.o. female recently admitted 2/14-2/17/22 with NSTEMI and LHC with severe CAD, recommended CABG vs. PCI, but left AMA, pt now readmitted 10/02/20 with respiratory distress; ETT 2/22-2/23. Workup for acute hypoxic respiratory failure likely secondary to acute cardiogenic pulmonary edema, possible HCAP, pleural effusion. Plan for CABG next week. PMH includes HTN, DM2, CAD, HF.   Clinical Impression   Patient admitted for the diagnosis above.  She is scheduled for CABGx4 on Tuesday 2/29.  PTA she was largely independent, but recently has needed increasing support for ADL and IADL from family due to increasing DOE.  Currently, due to the deficts listed, she is needing up to Min A for basic transfers and upper body ADL, and up to Max A for lower body ADL.  She is very anxious about SOB with activity and thus is needing probably more assist than needed.  Patient's goals are primarily upper body and transfers at this pint, but may need to be adjusted post surgery.  Post acute recommendations are HH at this point, OT will follow in the acute setting to maximize functional status and assist with discharge planning.       Follow Up Recommendations  Home health OT    Equipment Recommendations  3 in 1 bedside commode;Other (comment) (hip kit)    Recommendations for Other Services       Precautions / Restrictions Precautions Precautions: Fall Precaution Comments: Reviewed dressing in the tube, and hygiene precautions. Restrictions Weight Bearing Restrictions: No      Mobility Bed Mobility Overal bed mobility: Needs Assistance Bed Mobility: Supine to Sit     Supine to sit: Supervision;HOB elevated     General bed mobility comments: Increased time and DOE noted    Transfers Overall transfer level: Needs assistance    Transfers: Sit to/from Stand Sit to Stand: Min assist         General transfer comment: HHA to side step to head of bed, declined recliner at this point, but agreed to sit up later.  Left sitting edge of bed with HOB elevated.    Balance Overall balance assessment: Needs assistance Sitting-balance support: Single extremity supported;Feet supported Sitting balance-Leahy Scale: Fair     Standing balance support: Bilateral upper extremity supported Standing balance-Leahy Scale: Poor Standing balance comment: HHA -- patient gets anxious with DOE.  Cueing for pursed lip breathing.                           ADL either performed or assessed with clinical judgement   ADL Overall ADL's : Needs assistance/impaired Eating/Feeding: Independent;Sitting   Grooming: Wash/dry hands;Wash/dry face;Oral care;Set up;Sitting   Upper Body Bathing: Minimal assistance;Sitting Upper Body Bathing Details (indicate cue type and reason): increased time Lower Body Bathing: Moderate assistance;Sitting/lateral leans   Upper Body Dressing : Minimal assistance;Sitting   Lower Body Dressing: Moderate assistance;Sit to/from stand Lower Body Dressing Details (indicate cue type and reason): increased time and effort - DOE             Functional mobility during ADLs: Minimal assistance       Vision Baseline Vision/History: No visual deficits Patient Visual Report: No change from baseline       Perception     Praxis      Pertinent Vitals/Pain Pain Assessment: 0-10 Pain Score: 2  Pain Location: low back Pain Descriptors / Indicators: Aching Pain Intervention(s): Monitored during session;Repositioned     Hand Dominance Right   Extremity/Trunk Assessment Upper Extremity Assessment Upper Extremity Assessment: Generalized weakness   Lower Extremity Assessment Lower Extremity Assessment: Defer to PT evaluation   Cervical / Trunk Assessment Cervical / Trunk Assessment: Normal    Communication Communication Communication: No difficulties   Cognition Arousal/Alertness: Awake/alert Behavior During Therapy: WFL for tasks assessed/performed Overall Cognitive Status: Within Functional Limits for tasks assessed                                                      Home Living Family/patient expects to be discharged to:: Private residence Living Arrangements: Alone Available Help at Discharge: Family;Available 24 hours/day Type of Home: Apartment Home Access: Stairs to enter Entrance Stairs-Number of Steps: 16 Entrance Stairs-Rails: Right Home Layout: One level     Bathroom Shower/Tub: Teacher, early years/pre: Standard     Home Equipment: Shower seat          Prior Functioning/Environment Level of Independence: Independent                 OT Problem List: Decreased strength;Decreased activity tolerance;Impaired balance (sitting and/or standing);Decreased knowledge of use of DME or AE;Cardiopulmonary status limiting activity      OT Treatment/Interventions: Self-care/ADL training;Therapeutic exercise;Energy conservation;DME and/or AE instruction;Balance training;Patient/family education;Therapeutic activities    OT Goals(Current goals can be found in the care plan section) Acute Rehab OT Goals Patient Stated Goal: Get through the surgery first OT Goal Formulation: With patient Time For Goal Achievement: 10/20/20 Potential to Achieve Goals: Good ADL Goals Pt Will Perform Grooming: with set-up;sitting Pt Will Perform Upper Body Bathing: with supervision;sitting Pt Will Perform Upper Body Dressing: with supervision;sitting Pt Will Transfer to Toilet: with min guard assist;stand pivot transfer;bedside commode Pt Will Perform Toileting - Clothing Manipulation and hygiene: with min guard assist;sit to/from stand  OT Frequency: Min 2X/week   Barriers to D/C:    none noted       Co-evaluation               AM-PAC OT "6 Clicks" Daily Activity     Outcome Measure Help from another person eating meals?: None Help from another person taking care of personal grooming?: None Help from another person toileting, which includes using toliet, bedpan, or urinal?: A Lot Help from another person bathing (including washing, rinsing, drying)?: A Lot Help from another person to put on and taking off regular upper body clothing?: A Little Help from another person to put on and taking off regular lower body clothing?: A Lot 6 Click Score: 17   End of Session Equipment Utilized During Treatment: Oxygen  Activity Tolerance: Patient limited by fatigue Patient left: in bed;with call bell/phone within reach  OT Visit Diagnosis: Unsteadiness on feet (R26.81);Muscle weakness (generalized) (M62.81);Dizziness and giddiness (R42)                Time: 5631-4970 OT Time Calculation (min): 20 min Charges:  OT General Charges $OT Visit: 1 Visit OT Evaluation $OT Eval Moderate Complexity: 1 Mod  10/06/2020  Rich, OTR/L  Acute Rehabilitation Services  Office:  [phone]   Metta Clines 10/06/2020, 9:21 AM

## 2020-10-06 NOTE — Progress Notes (Signed)
   10/05/20 2015  Assess: MEWS Score  Pulse Rate (!) 104  Resp (!) 27  Level of Consciousness Alert  SpO2 100 %  O2 Device HFNC  O2 Flow Rate (L/min) 6 L/min  Assess: MEWS Score  MEWS Temp 0  MEWS Systolic 0  MEWS Pulse 1  MEWS RR 2  MEWS LOC 0  MEWS Score 3  MEWS Score Color Yellow  Assess: if the MEWS score is Yellow or Red  Were vital signs taken at a resting state? Yes  Focused Assessment No change from prior assessment  Early Detection of Sepsis Score *See Row Information* High  MEWS guidelines implemented *See Row Information* No, previously yellow, continue vital signs every 4 hours  Document  Patient Outcome Not stable and remains on department  Progress note created (see row info) Yes

## 2020-10-06 NOTE — Progress Notes (Signed)
Patient refused standing weight citing ongoing concerns for weakness and exertional dyspnea. Bed weight obtained.

## 2020-10-06 NOTE — Progress Notes (Signed)
[place] for IV heparin Indication: chest pain/ACS  Allergies  Allergen Reactions  . Penicillins Rash    Historical Has patient had a PCN reaction causing immediate rash, facial/tongue/throat swelling, SOB or lightheadedness with hypotension: No Has patient had a PCN reaction causing severe rash involving mucus membranes or skin necrosis: No Has patient had a PCN reaction that required hospitalization: No Has patient had a PCN reaction occurring within the last 10 years: No If all of the above answers are "NO", then may proceed with Cephalosporin use.    Patient Measurements: Weight: 113.3 kg (249 lb 12.5 oz) Heparin Dosing Weight: 80.6 kg  Vital Signs: Temp: 100 F (37.8 C) (02/26 0824) Temp Source: Oral (02/26 0824) BP: 135/84 (02/26 0458) Pulse Rate: 99 (02/26 0458)  Labs: Recent Labs    10/04/20 0232 10/04/20 0442 10/05/20 0331 10/05/20 0352 10/05/20 0353 10/06/20 0600  HGB 10.5*  --   --  8.4*  --  8.3*  HCT 35.3*  --   --  27.5*  --  25.9*  PLT 311  --   --  253  --  264  HEPARINUNFRC  --  0.40  --   --  0.25* 0.25*  CREATININE 1.00  --  1.07*  --   --  0.90    Estimated Creatinine Clearance: 98.3 mL/min (by C-G formula based on SCr of 0.9 mg/dL).   Medical History: Past Medical History:  Diagnosis Date  . Coronary artery disease   . Diabetes mellitus without complication ([place])   . Diabetic neuropathy ([place]) 02/2020  . Hyperlipidemia age 24  . Hypertension age 26  . Hypocalcemia 11/2019  . Hypokalemia 11/2019  . Iron deficiency anemia   . Obesity   . Proteinuria 12/09/2019    Medications:  Infusions:  . sodium chloride Stopped (10/04/20 2311)  . amiodarone 30 mg/hr (10/06/20 0430)  . ceFEPime (MAXIPIME) IV 2 g (10/06/20 0626)  . dexmedetomidine (PRECEDEX) IV infusion 0.2 mcg/kg/hr (10/05/20 0200)  . heparin 1,500 Units/hr (10/05/20 2205)    Assessment: 45 y.o. female admitted with respiratory distress,  intubated in the ED. Patient recently admitted for NSTEMI, found to have severe multivessel CAD, left AMA 09/27/20 after refusing CABG. Pt was also offered high risk PCI which was refused as well. Pharmacy has been consulted to start heparin. Plans are for CABG when more stable.   Heparin level remains 0.25, will increase slightly to 1,600 units/hr to keep at low end of goal. Hgb has been trending down, now 8.3. Plt ok. No bleeding issues noted per RN.  Goal of Therapy:  Heparin level 0.3-0.7 units/ml Monitor platelets by anticoagulation protocol: Yes   Plan:  Increase heparin to 1,600 units/h Follow up with HL in 6 hours  Monitor daily HL, CBC and s/sx bleeding  Mercy Riding, PharmD PGY1 Acute Care Pharmacy Resident Please refer to [facility] for unit-specific pharmacist

## 2020-10-06 NOTE — Progress Notes (Signed)
Tiffany Le for IV heparin Indication: chest pain/ACS  Allergies  Allergen Reactions  . Penicillins Rash    Historical Has patient had a PCN reaction causing immediate rash, facial/tongue/throat swelling, SOB or lightheadedness with hypotension: No Has patient had a PCN reaction causing severe rash involving mucus membranes or skin necrosis: No Has patient had a PCN reaction that required hospitalization: No Has patient had a PCN reaction occurring within the last 10 years: No If all of the above answers are "NO", then may proceed with Cephalosporin use.    Patient Measurements: Weight: 113.3 kg (249 lb 12.5 oz) Heparin Dosing Weight: 80.6 kg  Vital Signs: Temp: 98.9 F (37.2 C) (02/26 1605) Temp Source: Oral (02/26 0824) BP: 120/81 (02/26 1424) Pulse Rate: 95 (02/26 1424)  Labs: Recent Labs    10/04/20 0232 10/04/20 0442 10/05/20 0331 10/05/20 0352 10/05/20 0353 10/06/20 0600 10/06/20 1600  HGB 10.5*  --   --  8.4*  --  8.3*  --   HCT 35.3*  --   --  27.5*  --  25.9*  --   PLT 311  --   --  253  --  264  --   HEPARINUNFRC  --    < >  --   --  0.25* 0.25* 0.40  CREATININE 1.00  --  1.07*  --   --  0.90  --    < > = values in this interval not displayed.    Estimated Creatinine Clearance: 98.3 mL/min (by C-G formula based on SCr of 0.9 mg/dL).   Medical History: Past Medical History:  Diagnosis Date  . Coronary artery disease   . Diabetes mellitus without complication ([place])   . Diabetic neuropathy ([place]) 02/2020  . Hyperlipidemia age 38  . Hypertension age 71  . Hypocalcemia 11/2019  . Hypokalemia 11/2019  . Iron deficiency anemia   . Obesity   . Proteinuria 12/09/2019    Medications:  Infusions:  . sodium chloride Stopped (10/04/20 2311)  . amiodarone 30 mg/hr (10/06/20 1519)  . ceFEPime (MAXIPIME) IV 2 g (10/06/20 1444)  . dexmedetomidine (PRECEDEX) IV infusion 0.2 mcg/kg/hr (10/05/20 0200)  . heparin 1,600  Units/hr (10/06/20 1518)    Assessment: 45 y.o. female admitted with respiratory distress, intubated in the ED. Patient recently admitted for NSTEMI, found to have severe multivessel CAD, left AMA 09/27/20 after refusing CABG. Pt was also offered high risk PCI which was refused as well. Pharmacy has been consulted to start heparin. Plans are for CABG when more stable.   Heparin level 0.4 at goal on heparin drip 1600 uts/hr. Hgb has been trending down, now 8.3. Plt ok. No bleeding issues noted per RN.  Goal of Therapy:  Heparin level 0.3-0.7 units/ml Monitor platelets by anticoagulation protocol: Yes   Plan:  Continue  heparin  1,600 units/h Monitor daily HL, CBC and s/sx bleeding    Bonnita Nasuti Pharm.D. CPP, BCPS Clinical Pharmacist [phone] 10/06/2020 7:23 PM    Please refer to [facility] for unit-specific pharmacist

## 2020-10-06 NOTE — Progress Notes (Signed)
Patient ID: Tiffany Le, female   DOB: December 07, 1975, 45 y.o.   MRN: [id number]    Advanced Heart Failure Rounding Note  PCP-Cardiologist: Quay Burow, MD   Subjective:    Remains on vancomycin/cefepime for PNA. Fever curve coming down. Tm 100.0 WBC   9.3 -> 7.9k  Co-ox 69% Getting IV lasix 80 bid. Weight down 2 pounds. CVP 7-8  Lying in bed. Denies CP or SOB. Taking very shallow breaths   On heparin. No bleeding. Hgb stable at 8.3. Plavix on hold.   Objective:   Weight Range: 113.3 kg Body mass index is 42.87 kg/m.   Vital Signs:   Temp:  [98 F (36.7 C)-100 F (37.8 C)] 98.9 F (37.2 C) (02/26 1036) Pulse Rate:  [99-107] 100 (02/26 1036) Resp:  [20-35] 28 (02/26 1036) BP: (111-147)/(67-93) 111/67 (02/26 1036) SpO2:  [94 %-100 %] 99 % (02/26 1036) FiO2 (%):  [50 %] 50 % (02/25 1446) Weight:  [113.3 kg] 113.3 kg (02/26 0034) Last BM Date: 10/03/20  Weight change: Filed Weights   10/04/20 0515 10/05/20 0344 10/06/20 0034  Weight: 111.5 kg 113.9 kg 113.3 kg    Intake/Output:   Intake/Output Summary (Last 24 hours) at 10/06/2020 1040 Last data filed at 10/06/2020 0825 Gross per 24 hour  Intake 1609.74 ml  Output 3000 ml  Net -1390.26 ml      Physical Exam    General:  Obese woman lying in bed  No resp difficulty HEENT: normal Neck: supple. JVP hard to see  Carotids 2+ bilat; no bruits. No lymphadenopathy or thryomegaly appreciated. Cor: PMI nondisplaced. Regular mildly tachy  No rubs, gallops or murmurs. Lungs: shallow breathing. + rhonchi Abdomen: obese soft, nontender, nondistended. No hepatosplenomegaly. No bruits or masses. Good bowel sounds. Extremities: no cyanosis, clubbing, rash, tr edema Neuro: alert & orientedx3, cranial nerves grossly intact. moves all 4 extremities w/o difficulty. Affect pleasant   Telemetry   Sinus 100-110 Personally reviewed  Labs    CBC Recent Labs    10/05/20 0352 10/06/20 0600  WBC 9.3 7.9  HGB 8.4* 8.3*   HCT 27.5* 25.9*  MCV 89.3 85.5  PLT 253 160   Basic Metabolic Panel Recent Labs    10/04/20 0232 10/05/20 0331 10/06/20 0600  NA 138 137 134*  K 4.2 3.5 3.8  CL 106 102 101  CO2 20* 21* 24  GLUCOSE 216* 144* 144*  BUN 22* 26* 27*  CREATININE 1.00 1.07* 0.90  CALCIUM 8.1* 7.9* 8.0*  MG 2.4  --  2.4  PHOS 4.9* 3.2 1.9*   Liver Function Tests Recent Labs    10/05/20 0331 10/06/20 0600  ALBUMIN 2.2* 2.2*   No results for input(s): LIPASE, AMYLASE in the last 72 hours. Cardiac Enzymes No results for input(s): CKTOTAL, CKMB, CKMBINDEX, TROPONINI in the last 72 hours.  BNP: BNP (last 3 results) Recent Labs    10/02/20 0516  BNP 947.1*    ProBNP (last 3 results) No results for input(s): PROBNP in the last 8760 hours.   D-Dimer No results for input(s): DDIMER in the last 72 hours. Hemoglobin A1C No results for input(s): HGBA1C in the last 72 hours. Fasting Lipid Panel No results for input(s): CHOL, HDL, LDLCALC, TRIG, CHOLHDL, LDLDIRECT in the last 72 hours. Thyroid Function Tests No results for input(s): TSH, T4TOTAL, T3FREE, THYROIDAB in the last 72 hours.  Invalid input(s): FREET3  Other results:   Imaging    No results found.   Medications:  Scheduled Medications: . aspirin  81 mg Oral Daily  . atorvastatin  80 mg Oral Daily  . chlorhexidine  15 mL Mouth Rinse BID  . Chlorhexidine Gluconate Cloth  6 each Topical Daily  . docusate  100 mg Oral BID  . furosemide  80 mg Intravenous BID  . insulin aspart  0-20 Units Subcutaneous Q4H  . isosorbide-hydrALAZINE  0.5 tablet Oral TID  . mouth rinse  15 mL Mouth Rinse BID  . mouth rinse  15 mL Mouth Rinse q12n4p  . pantoprazole (PROTONIX) IV  40 mg Intravenous QHS  . polyethylene glycol  17 g Per Tube Daily  . potassium chloride  40 mEq Oral Daily  . sodium chloride flush  10-40 mL Intracatheter Q12H  . spironolactone  25 mg Oral Daily    Infusions: . sodium chloride Stopped (10/04/20 2311)   . amiodarone 30 mg/hr (10/06/20 0430)  . ceFEPime (MAXIPIME) IV 2 g (10/06/20 0626)  . dexmedetomidine (PRECEDEX) IV infusion 0.2 mcg/kg/hr (10/05/20 0200)  . heparin 1,600 Units/hr (10/06/20 0858)    PRN Medications: sodium chloride, acetaminophen, hydrALAZINE, hydrOXYzine, levalbuterol, ondansetron (ZOFRAN) IV, polyethylene glycol, sodium chloride flush   Assessment/Plan   1. CAD: NSTEMI last admission, cath showed multivessel CAD (has strong FH of premature CAD as well as DM and HTN). CABG recommended but she left AMA.  She returned with marked dyspnea/pulmonary edema.  HS-TnI peaked at 3190.  ST elevation across precordium. Echo unchanged with EF 35-40%.  Case reviewed with interventional cardiology => she really does not have a good PCI option.  Will plan to stabilize and try to get her to CABG.  - Continue ASA 81/high-intensity statin  - Remains heparin gtt. Discussed dosing with PharmD personally. - Hold Plavix for CABG.  - Dr. Orvan Seen following, possible CABG this week. Check P2y12 on Monday 2. Acute systolic CHF: Echo (6/27) with EF 35-40%, mild LVH (similar this admission and last).  Elevated lactate in setting of hypoxemia prior to intubation.  She is volume overloaded on exam. Co-ox 69%  Remains on IV lasix with modest diuresis. Renal function improved  CVP 7-8 - Continue Lasix 80 mg IV bid one more day. No metolazone - Continue Spironolactone 25 mg daily.  - Continue Bidil 0.5 tab tid (avoid starting ARB/ARNI pre-CABG, will need after).  3. ID: Tm 100.0 with last PCT 1.47, WBCs 11.6>>10>>9.3 -? 7.9  Concern for possible aspiration PNA.  CXR with bilateral infiltrates initially, now clearing with RUL infiltrate only (suspect she asymmetric edema or mucus plugging also).  - Continue cefepime/vancomycin per CCM.  - Cultures NGTD 4. Acute hypoxemic respiratory failure: Intubated with pulmonary edema and possible PNA. Extubated 2/23. Now off Bipap and on HFNC.  - Diuresis.  -  Antibiotics.  - Encouraged OOB to chair. Incentive spirometer  5. Type 2 diabetes: Control improved recently.  6. VT: Noted in ER though strips not available.  - no further VT on tele - Continue amiodarone gtt.     Glori Bickers MD 10/06/2020 10:40 AM

## 2020-10-07 ENCOUNTER — Other Ambulatory Visit: Payer: Self-pay | Admitting: Cardiology

## 2020-10-07 LAB — CULTURE, BLOOD (ROUTINE X 2)
Culture: NO GROWTH
Culture: NO GROWTH

## 2020-10-07 LAB — CBC
HCT: 24.1 % — ABNORMAL LOW (ref 36.0–46.0)
Hemoglobin: 7.9 g/dL — ABNORMAL LOW (ref 12.0–15.0)
MCH: 27.7 pg (ref 26.0–34.0)
MCHC: 32.8 g/dL (ref 30.0–36.0)
MCV: 84.6 fL (ref 80.0–100.0)
Platelets: 267 10*3/uL (ref 150–400)
RBC: 2.85 MIL/uL — ABNORMAL LOW (ref 3.87–5.11)
RDW: 13.7 % (ref 11.5–15.5)
WBC: 6.4 10*3/uL (ref 4.0–10.5)
nRBC: 0 % (ref 0.0–0.2)

## 2020-10-07 LAB — HEPARIN LEVEL (UNFRACTIONATED): Heparin Unfractionated: 0.42 IU/mL (ref 0.30–0.70)

## 2020-10-07 LAB — BASIC METABOLIC PANEL
Anion gap: 9 (ref 5–15)
BUN: 27 mg/dL — ABNORMAL HIGH (ref 6–20)
CO2: 25 mmol/L (ref 22–32)
Calcium: 7.6 mg/dL — ABNORMAL LOW (ref 8.9–10.3)
Chloride: 100 mmol/L (ref 98–111)
Creatinine, Ser: 0.86 mg/dL (ref 0.44–1.00)
GFR, Estimated: >60 mL/min (ref >60)
Glucose, Bld: 267 mg/dL — ABNORMAL HIGH (ref 70–99)
Potassium: 3.6 mmol/L (ref 3.5–5.1)
Sodium: 134 mmol/L — ABNORMAL LOW (ref 135–145)

## 2020-10-07 LAB — GLUCOSE, CAPILLARY
Glucose-Capillary: 179 mg/dL — ABNORMAL HIGH (ref 70–99)
Glucose-Capillary: 183 mg/dL — ABNORMAL HIGH (ref 70–99)
Glucose-Capillary: 211 mg/dL — ABNORMAL HIGH (ref 70–99)
Glucose-Capillary: 182 mg/dL — ABNORMAL HIGH (ref 70–99)

## 2020-10-07 LAB — COOXEMETRY PANEL
Carboxyhemoglobin: 0.8 % (ref 0.5–1.5)
Methemoglobin: 1 % (ref 0.0–1.5)
O2 Saturation: 71.7 %
Total hemoglobin: 11.5 g/dL — ABNORMAL LOW (ref 12.0–16.0)

## 2020-10-07 LAB — MAGNESIUM: Magnesium: 2.2 mg/dL (ref 1.7–2.4)

## 2020-10-07 MED ORDER — METOLAZONE 5 MG PO TABS
5.0000 mg | ORAL_TABLET | Freq: Once | ORAL | Status: AC
  Administered 2020-10-07: 5 mg via ORAL
  Filled 2020-10-07: qty 1

## 2020-10-07 MED ORDER — POTASSIUM CHLORIDE CRYS ER 20 MEQ PO TBCR
40.0000 meq | EXTENDED_RELEASE_TABLET | Freq: Once | ORAL | Status: AC
  Administered 2020-10-07: 40 meq via ORAL
  Filled 2020-10-07: qty 2

## 2020-10-07 MED ORDER — AMIODARONE HCL 200 MG PO TABS
200.0000 mg | ORAL_TABLET | Freq: Two times a day (BID) | ORAL | Status: DC
  Administered 2020-10-07 – 2020-10-08 (×4): 200 mg via ORAL
  Filled 2020-10-07 (×4): qty 1

## 2020-10-07 NOTE — Plan of Care (Signed)
  Problem: Nutrition: Goal: Adequate nutrition will be maintained Outcome: Completed/Met

## 2020-10-07 NOTE — Progress Notes (Signed)
Patient refused Bipap tonight. Pt stable on 6L [place].

## 2020-10-07 NOTE — Plan of Care (Signed)
Problem: Skin Integrity: Goal: Risk for impaired skin integrity will decrease Outcome: Completed/Met   Problem: Safety: Goal: Ability to remain free from injury will improve Outcome: Completed/Met   Problem: Pain Managment: Goal: General experience of comfort will improve Outcome: Completed/Met   

## 2020-10-07 NOTE — Progress Notes (Signed)
[place] for IV heparin Indication: chest pain/ACS  Allergies  Allergen Reactions  . Penicillins Rash    Historical Has patient had a PCN reaction causing immediate rash, facial/tongue/throat swelling, SOB or lightheadedness with hypotension: No Has patient had a PCN reaction causing severe rash involving mucus membranes or skin necrosis: No Has patient had a PCN reaction that required hospitalization: No Has patient had a PCN reaction occurring within the last 10 years: No If all of the above answers are "NO", then may proceed with Cephalosporin use.    Patient Measurements: Weight: 114.5 kg (252 lb 6.8 oz) (bed scale) Heparin Dosing Weight: 80.6 kg  Vital Signs: Temp: 97.7 F (36.5 C) (02/27 0722) Temp Source: Oral (02/27 0722) BP: 117/78 (02/27 0449) Pulse Rate: 84 (02/27 0449)  Labs: Recent Labs     0000 10/05/20 0331 10/05/20 0352 10/05/20 0353 10/06/20 0600 10/06/20 1600 10/07/20 0523  HGB   < >  --  8.4*  --  8.3*  --  7.9*  HCT  --   --  27.5*  --  25.9*  --  24.1*  PLT  --   --  253  --  264  --  267  HEPARINUNFRC  --   --   --    < > 0.25* 0.40 0.42  CREATININE  --  1.07*  --   --  0.90  --  0.86   < > = values in this interval not displayed.    Estimated Creatinine Clearance: 103.6 mL/min (by C-G formula based on SCr of 0.86 mg/dL).   Medical History: Past Medical History:  Diagnosis Date  . Coronary artery disease   . Diabetes mellitus without complication ([place])   . Diabetic neuropathy ([place]) 02/2020  . Hyperlipidemia age 64  . Hypertension age 89  . Hypocalcemia 11/2019  . Hypokalemia 11/2019  . Iron deficiency anemia   . Obesity   . Proteinuria 12/09/2019    Medications:  Infusions:  . sodium chloride Stopped (10/04/20 2311)  . ceFEPime (MAXIPIME) IV 2 g (10/07/20 0540)  . heparin 1,600 Units/hr (10/06/20 1518)    Assessment: 45 y.o. female admitted with respiratory distress, intubated in the ED.  Patient recently admitted for NSTEMI, found to have severe multivessel CAD, left AMA 09/27/20 after refusing CABG. Pt was also offered high risk PCI which was refused as well. Pharmacy has been consulted to start heparin. Plans are for CABG when pt is more stable.   Heparin level 0.42 at goal with heparin drip at 1,600 units/hr. Hgb has been trending down, now 7.9. Plt ok. No bleeding or issues with gtt noted per RN.  Goal of Therapy:  Heparin level 0.3-0.7 units/ml Monitor platelets by anticoagulation protocol: Yes   Plan:  Continue IV heparin at 1,600 units/h Monitor daily HL, CBC and s/sx bleeding  Mercy Riding, PharmD PGY1 Acute Care Pharmacy Resident Please refer to [facility] for unit-specific pharmacist

## 2020-10-07 NOTE — Progress Notes (Addendum)
Patient ID: Tiffany Le, female   DOB: [birth year], 45 y.o.   MRN: [id number]    Advanced Heart Failure Rounding Note  PCP-Cardiologist: Quay Burow, MD   Subjective:    Remains on vancomycin/cefepime for PNA. Afebrile this am. WBC 6.4k   Co-ox 72 % Remains on IV lasix 80 bid. Weight recorded as up 3 pounds (?). Creatinine stable at 0.86. CVP 12-13  Sitting up in bed. Feeling better. Sat up in chair for several hours. No CP, SOB, orthopnea or PND.  On heparin. No bleeding  Objective:   Weight Range: 114.5 kg Body mass index is 43.33 kg/m.   Vital Signs:   Temp:  [97.4 F (36.3 C)-100 F (37.8 C)] 98.2 F (36.8 C) (02/27 0449) Pulse Rate:  [84-100] 84 (02/27 0449) Resp:  [18-28] 19 (02/27 0449) BP: (111-120)/(67-81) 117/78 (02/27 0449) SpO2:  [97 %-100 %] 99 % (02/27 0449) Weight:  [114.5 kg] 114.5 kg (02/27 0016) Last BM Date: 10/03/20  Weight change: Filed Weights   10/05/20 0344 10/06/20 0034 10/07/20 0016  Weight: 113.9 kg 113.3 kg 114.5 kg    Intake/Output:   Intake/Output Summary (Last 24 hours) at 10/07/2020 0700 Last data filed at 10/07/2020 0500 Gross per 24 hour  Intake 677 ml  Output 2300 ml  Net -1623 ml      Physical Exam    CVP 12-13 General:  Obese woman lying in bed  No resp difficulty HEENT: normal Neck: supple. JVP hard to see. Carotids 2+ bilat; no bruits. No lymphadenopathy or thryomegaly appreciated. Cor: PMI nondisplaced. Regular rate & rhythm. No rubs, gallops or murmurs. Lungs: clear Abdomen: soft, nontender, nondistended. No hepatosplenomegaly. No bruits or masses. Good bowel sounds. Extremities: no cyanosis, clubbing, rash, 1-2+ edema Neuro: alert & orientedx3, cranial nerves grossly intact. moves all 4 extremities w/o difficulty. Affect pleasant   Telemetry   Sinus 80-90s Personally reviewed  Labs    CBC Recent Labs    10/06/20 0600 10/07/20 0523  WBC 7.9 6.4  HGB 8.3* 7.9*  HCT 25.9* 24.1*  MCV 85.5 84.6   PLT 264 382   Basic Metabolic Panel Recent Labs    10/05/20 0331 10/06/20 0600 10/07/20 0523  NA 137 134* 134*  K 3.5 3.8 3.6  CL 102 101 100  CO2 21* 24 25  GLUCOSE 144* 144* 267*  BUN 26* 27* 27*  CREATININE 1.07* 0.90 0.86  CALCIUM 7.9* 8.0* 7.6*  MG  --  2.4 2.2  PHOS 3.2 1.9*  --    Liver Function Tests Recent Labs    10/05/20 0331 10/06/20 0600  ALBUMIN 2.2* 2.2*   No results for input(s): LIPASE, AMYLASE in the last 72 hours. Cardiac Enzymes No results for input(s): CKTOTAL, CKMB, CKMBINDEX, TROPONINI in the last 72 hours.  BNP: BNP (last 3 results) Recent Labs    10/02/20 0516  BNP 947.1*    ProBNP (last 3 results) No results for input(s): PROBNP in the last 8760 hours.   D-Dimer No results for input(s): DDIMER in the last 72 hours. Hemoglobin A1C No results for input(s): HGBA1C in the last 72 hours. Fasting Lipid Panel No results for input(s): CHOL, HDL, LDLCALC, TRIG, CHOLHDL, LDLDIRECT in the last 72 hours. Thyroid Function Tests No results for input(s): TSH, T4TOTAL, T3FREE, THYROIDAB in the last 72 hours.  Invalid input(s): FREET3  Other results:   Imaging    No results found.   Medications:     Scheduled Medications: . aspirin  81 mg Oral  Daily  . atorvastatin  80 mg Oral Daily  . chlorhexidine  15 mL Mouth Rinse BID  . Chlorhexidine Gluconate Cloth  6 each Topical Daily  . docusate  100 mg Oral BID  . furosemide  80 mg Intravenous BID  . insulin aspart  0-15 Units Subcutaneous TID WC  . isosorbide-hydrALAZINE  0.5 tablet Oral TID  . mouth rinse  15 mL Mouth Rinse BID  . mouth rinse  15 mL Mouth Rinse q12n4p  . pantoprazole  40 mg Oral QHS  . polyethylene glycol  17 g Per Tube Daily  . potassium chloride  40 mEq Oral Daily  . sodium chloride flush  10-40 mL Intracatheter Q12H  . spironolactone  25 mg Oral Daily    Infusions: . sodium chloride Stopped (10/04/20 2311)  . amiodarone 30 mg/hr (10/07/20 0326)  . ceFEPime  (MAXIPIME) IV 2 g (10/07/20 0540)  . heparin 1,600 Units/hr (10/06/20 1518)    PRN Medications: sodium chloride, acetaminophen, hydrALAZINE, hydrOXYzine, levalbuterol, ondansetron (ZOFRAN) IV, polyethylene glycol, sodium chloride flush   Assessment/Plan   1. CAD: NSTEMI last admission, cath showed multivessel CAD (has strong FH of premature CAD as well as DM and HTN). CABG recommended but she left AMA.  She returned with marked dyspnea/pulmonary edema.  HS-TnI peaked at 3190.  ST elevation across precordium. Echo unchanged with EF 35-40%.  Case reviewed with interventional cardiology => she really does not have a good PCI option.  Plan is to stabilize and try to get her to CABG early this week.  - Continue ASA 81/high-intensity statin  - Remains heparin gtt. Discussed dosing with PharmD personally. - Hold Plavix for CABG. Check P2y12 in am - Dr. Orvan Seen following, possible CABG this week.  2. Acute systolic CHF: Echo (2/70) with EF 35-40%, mild LVH (similar this admission and last).  Elevated lactate in setting of hypoxemia prior to intubation.  Remains on IV lasix. Volume status improving but weight recorded as up today. Co-ox 72%  Remains on IV lasix with modest diuresis CVP climbing (12-13). Will add metolazone 5. Renal function stable - Continue Lasix 80 mg IV bid add metolazone 5 - Place TED hose.  - Continue Spironolactone 25 mg daily.  - Continue Bidil 0.5 tab tid (avoid starting ARB/ARNI pre-CABG, will need after).  3. ID:  Concern for possible aspiration PNA.  CXR with bilateral infiltrates initially, now clearing with RUL infiltrate only (suspect she asymmetric edema or mucus plugging also). Now AF. WBC 6 - Continue cefepime/vancomycin per CCM.  - Cultures NGTD 4. Acute hypoxemic respiratory failure: Intubated with pulmonary edema and possible PNA. Extubated 2/23. Now off Bipap and on HFNC.  - Diurectic as above - Antibiotics.  - Encouraged OOB to chair. Incentive spirometer  5.  Type 2 diabetes: Control improved recently.  6. VT: Noted in ER though strips not available.  - no further VT on tele - Switch amio to 200 bid to limit volume    Glori Bickers MD 10/07/2020 7:00 AM

## 2020-10-07 NOTE — Progress Notes (Signed)
      [address]       [place] [address]             [phone]      Patient doing much better.  Is definitely going to have surgery, knowing her situation could have been much worse.  Planning for CABG Tuesday with Dr. Lenard Galloway, PA-C

## 2020-10-08 ENCOUNTER — Encounter (HOSPITAL_COMMUNITY): Payer: Self-pay | Admitting: *Deleted

## 2020-10-08 ENCOUNTER — Inpatient Hospital Stay (HOSPITAL_COMMUNITY): Payer: Medicaid Other

## 2020-10-08 LAB — BASIC METABOLIC PANEL
Anion gap: 8 (ref 5–15)
Anion gap: 12 (ref 5–15)
BUN: 23 mg/dL — ABNORMAL HIGH (ref 6–20)
BUN: 22 mg/dL — ABNORMAL HIGH (ref 6–20)
CO2: 28 mmol/L (ref 22–32)
CO2: 29 mmol/L (ref 22–32)
Calcium: 8 mg/dL — ABNORMAL LOW (ref 8.9–10.3)
Calcium: 8.3 mg/dL — ABNORMAL LOW (ref 8.9–10.3)
Chloride: 100 mmol/L (ref 98–111)
Chloride: 92 mmol/L — ABNORMAL LOW (ref 98–111)
Creatinine, Ser: 0.83 mg/dL (ref 0.44–1.00)
Creatinine, Ser: 0.91 mg/dL (ref 0.44–1.00)
GFR, Estimated: >60 mL/min (ref >60)
GFR, Estimated: >60 mL/min (ref >60)
Glucose, Bld: 179 mg/dL — ABNORMAL HIGH (ref 70–99)
Glucose, Bld: 247 mg/dL — ABNORMAL HIGH (ref 70–99)
Potassium: 3.3 mmol/L — ABNORMAL LOW (ref 3.5–5.1)
Potassium: 3.2 mmol/L — ABNORMAL LOW (ref 3.5–5.1)
Sodium: 136 mmol/L (ref 135–145)
Sodium: 133 mmol/L — ABNORMAL LOW (ref 135–145)

## 2020-10-08 LAB — GLUCOSE, CAPILLARY
Glucose-Capillary: 161 mg/dL — ABNORMAL HIGH (ref 70–99)
Glucose-Capillary: 172 mg/dL — ABNORMAL HIGH (ref 70–99)
Glucose-Capillary: 260 mg/dL — ABNORMAL HIGH (ref 70–99)
Glucose-Capillary: 149 mg/dL — ABNORMAL HIGH (ref 70–99)

## 2020-10-08 LAB — CBC
HCT: 26 % — ABNORMAL LOW (ref 36.0–46.0)
Hemoglobin: 8.1 g/dL — ABNORMAL LOW (ref 12.0–15.0)
MCH: 26.7 pg (ref 26.0–34.0)
MCHC: 31.2 g/dL (ref 30.0–36.0)
MCV: 85.8 fL (ref 80.0–100.0)
Platelets: 294 10*3/uL (ref 150–400)
RBC: 3.03 MIL/uL — ABNORMAL LOW (ref 3.87–5.11)
RDW: 13.7 % (ref 11.5–15.5)
WBC: 6.8 10*3/uL (ref 4.0–10.5)
nRBC: 0 % (ref 0.0–0.2)

## 2020-10-08 LAB — PLATELET INHIBITION P2Y12: Platelet Function  P2Y12: 275 [PRU] (ref 182–335)

## 2020-10-08 LAB — COOXEMETRY PANEL
Carboxyhemoglobin: 0.8 % (ref 0.5–1.5)
Methemoglobin: 0.8 % (ref 0.0–1.5)
O2 Saturation: 68.8 %
Total hemoglobin: 13.6 g/dL (ref 12.0–16.0)

## 2020-10-08 LAB — HEPARIN LEVEL (UNFRACTIONATED): Heparin Unfractionated: 0.31 IU/mL (ref 0.30–0.70)

## 2020-10-08 LAB — ABO/RH: ABO/RH(D): O POS

## 2020-10-08 MED ORDER — TRANEXAMIC ACID (OHS) PUMP PRIME SOLUTION
2.0000 mg/kg | INTRAVENOUS | Status: DC
  Filled 2020-10-08: qty 2.29

## 2020-10-08 MED ORDER — TEMAZEPAM 15 MG PO CAPS: 15.0000 mg | ORAL_CAPSULE | Freq: Once | ORAL | Status: DC | PRN

## 2020-10-08 MED ORDER — TRANEXAMIC ACID 1000 MG/10ML IV SOLN
1.5000 mg/kg/h | INTRAVENOUS | Status: AC
  Administered 2020-10-09: 1.5 mg/kg/h via INTRAVENOUS
  Filled 2020-10-08: qty 25

## 2020-10-08 MED ORDER — POTASSIUM CHLORIDE CRYS ER 20 MEQ PO TBCR: 30.0000 meq | EXTENDED_RELEASE_TABLET | ORAL | Status: DC

## 2020-10-08 MED ORDER — POTASSIUM CHLORIDE 2 MEQ/ML IV SOLN
80.0000 meq | INTRAVENOUS | Status: DC
  Filled 2020-10-08: qty 40

## 2020-10-08 MED ORDER — NOREPINEPHRINE 4 MG/250ML-% IV SOLN
0.0000 ug/min | INTRAVENOUS | Status: DC
  Filled 2020-10-08: qty 250

## 2020-10-08 MED ORDER — METOLAZONE 2.5 MG PO TABS
2.5000 mg | ORAL_TABLET | Freq: Once | ORAL | Status: AC
  Administered 2020-10-08: 2.5 mg via ORAL
  Filled 2020-10-08: qty 1

## 2020-10-08 MED ORDER — VANCOMYCIN HCL 1500 MG/300ML IV SOLN
1500.0000 mg | INTRAVENOUS | Status: AC
  Administered 2020-10-09: 1500 mg via INTRAVENOUS
  Filled 2020-10-08: qty 300

## 2020-10-08 MED ORDER — CHLORHEXIDINE GLUCONATE CLOTH 2 % EX PADS
6.0000 | MEDICATED_PAD | Freq: Once | CUTANEOUS | Status: AC
  Administered 2020-10-09: 6 via TOPICAL

## 2020-10-08 MED ORDER — PHENYLEPHRINE HCL-NACL 20-0.9 MG/250ML-% IV SOLN
30.0000 ug/min | INTRAVENOUS | Status: AC
  Administered 2020-10-09: 40 ug/min via INTRAVENOUS
  Filled 2020-10-08: qty 250

## 2020-10-08 MED ORDER — POTASSIUM CHLORIDE CRYS ER 20 MEQ PO TBCR
40.0000 meq | EXTENDED_RELEASE_TABLET | ORAL | Status: AC
  Administered 2020-10-08 (×2): 40 meq via ORAL
  Filled 2020-10-08 (×2): qty 2

## 2020-10-08 MED ORDER — SODIUM CHLORIDE 0.9 % IV SOLN
1.5000 g | INTRAVENOUS | Status: AC
  Administered 2020-10-09: 1.5 g via INTRAVENOUS
  Filled 2020-10-08: qty 1.5

## 2020-10-08 MED ORDER — PLASMA-LYTE 148 IV SOLN
INTRAVENOUS | Status: DC
  Filled 2020-10-08: qty 2.5

## 2020-10-08 MED ORDER — MILRINONE LACTATE IN DEXTROSE 20-5 MG/100ML-% IV SOLN
0.3000 ug/kg/min | INTRAVENOUS | Status: AC
  Administered 2020-10-09: .35 ug/kg/min via INTRAVENOUS
  Filled 2020-10-08: qty 100

## 2020-10-08 MED ORDER — SODIUM CHLORIDE 0.9 % IV SOLN
INTRAVENOUS | Status: DC
  Filled 2020-10-08: qty 30

## 2020-10-08 MED ORDER — BISACODYL 5 MG PO TBEC
5.0000 mg | DELAYED_RELEASE_TABLET | Freq: Once | ORAL | Status: AC
  Administered 2020-10-08: 5 mg via ORAL
  Filled 2020-10-08: qty 1

## 2020-10-08 MED ORDER — CHLORHEXIDINE GLUCONATE 0.12 % MT SOLN
15.0000 mL | Freq: Once | OROMUCOSAL | Status: AC
  Administered 2020-10-09: 15 mL via OROMUCOSAL
  Filled 2020-10-08: qty 15

## 2020-10-08 MED ORDER — FUROSEMIDE 10 MG/ML IJ SOLN
80.0000 mg | Freq: Once | INTRAMUSCULAR | Status: AC
  Administered 2020-10-08: 80 mg via INTRAVENOUS

## 2020-10-08 MED ORDER — DEXMEDETOMIDINE HCL IN NACL 400 MCG/100ML IV SOLN
0.1000 ug/kg/h | INTRAVENOUS | Status: AC
  Administered 2020-10-09: .3 ug/kg/h via INTRAVENOUS
  Filled 2020-10-08: qty 100

## 2020-10-08 MED ORDER — CHLORHEXIDINE GLUCONATE CLOTH 2 % EX PADS
6.0000 | MEDICATED_PAD | Freq: Once | CUTANEOUS | Status: AC
  Administered 2020-10-08: 6 via TOPICAL

## 2020-10-08 MED ORDER — MAGNESIUM SULFATE 50 % IJ SOLN
40.0000 meq | INTRAMUSCULAR | Status: DC
  Filled 2020-10-08: qty 9.85

## 2020-10-08 MED ORDER — METOPROLOL TARTRATE 12.5 MG HALF TABLET
12.5000 mg | ORAL_TABLET | Freq: Once | ORAL | Status: AC
  Administered 2020-10-09: 12.5 mg via ORAL
  Filled 2020-10-08: qty 1

## 2020-10-08 MED ORDER — INSULIN REGULAR(HUMAN) IN NACL 100-0.9 UT/100ML-% IV SOLN
INTRAVENOUS | Status: AC
  Administered 2020-10-09: 4.8 [IU]/h via INTRAVENOUS
  Filled 2020-10-08: qty 100

## 2020-10-08 MED ORDER — ISOSORB DINITRATE-HYDRALAZINE 20-37.5 MG PO TABS
1.0000 | ORAL_TABLET | Freq: Three times a day (TID) | ORAL | Status: DC
  Administered 2020-10-08 (×3): 1 via ORAL
  Filled 2020-10-08 (×2): qty 1

## 2020-10-08 MED ORDER — EPINEPHRINE HCL 5 MG/250ML IV SOLN IN NS
0.0000 ug/min | INTRAVENOUS | Status: AC
  Administered 2020-10-09: 2 ug/min via INTRAVENOUS
  Filled 2020-10-08: qty 250

## 2020-10-08 MED ORDER — FUROSEMIDE 10 MG/ML IJ SOLN
12.0000 mg/h | INTRAVENOUS | Status: DC
  Administered 2020-10-08: 12 mg/h via INTRAVENOUS
  Filled 2020-10-08 (×2): qty 20

## 2020-10-08 MED ORDER — POTASSIUM CHLORIDE CRYS ER 20 MEQ PO TBCR
40.0000 meq | EXTENDED_RELEASE_TABLET | Freq: Once | ORAL | Status: AC
  Administered 2020-10-08: 40 meq via ORAL
  Filled 2020-10-08: qty 2

## 2020-10-08 MED ORDER — TRANEXAMIC ACID (OHS) BOLUS VIA INFUSION
15.0000 mg/kg | INTRAVENOUS | Status: AC
  Administered 2020-10-09: 1717.5 mg via INTRAVENOUS
  Filled 2020-10-08: qty 1718

## 2020-10-08 MED ORDER — NITROGLYCERIN IN D5W 200-5 MCG/ML-% IV SOLN
2.0000 ug/min | INTRAVENOUS | Status: AC
  Administered 2020-10-09: 5 ug/min via INTRAVENOUS
  Filled 2020-10-08: qty 250

## 2020-10-08 MED ORDER — SODIUM CHLORIDE 0.9 % IV SOLN
750.0000 mg | INTRAVENOUS | Status: AC
  Administered 2020-10-09: 750 mg via INTRAVENOUS
  Filled 2020-10-08: qty 750

## 2020-10-08 NOTE — Progress Notes (Signed)
      [address]       [place] [address]             [phone]      Says she feels good, denies chest pain or shortness of breath. She has some anxieties related to the endotracheal tube and post-op vent mgt. Goals for early extubation explained.  She is on day 7 IV cefepime for suspected pneumonia. WBC 6,800, cough is clear and she is afebrile. Heparin infusing. P2y12 is 275 indicating adequate plt function.   Plan for OR tomorrow for CABG.   Macarthur Critchley, PA-C [phone]

## 2020-10-08 NOTE — Telephone Encounter (Signed)
Pt has an upcoming appt on 10/12/20 with Dr. Gwenlyn Found. Please address

## 2020-10-08 NOTE — Progress Notes (Signed)
Pharmacy Antibiotic Note  Tiffany Le is a 45 y.o. female admitted on 10/02/2020 with pneumonia  Pharmacy has been consulted for cefepime dosing.   On day #6 of antibiotics - Scr 0.83 (CrCl >100 mL/min). Afebrile, WBC 6.8. Plan for CABG on 2/29.   Plan: Continue Cefepime 2g IV q8h Monitor clinical progress, c/s, renal function Stop date entered for today - will receive post-op abx which will complete course  Weight: 114.5 kg (252 lb 6.8 oz)  Temp (24hrs), Avg:98.8 F (37.1 C), Min:98.2 F (36.8 C), Max:99.4 F (37.4 C)  Recent Labs  Lab 10/02/20 0516 10/02/20 0834 10/02/20 1426 10/02/20 2252 10/03/20 0318 10/04/20 0232 10/05/20 0331 10/05/20 0352 10/06/20 0600 10/07/20 0523 10/08/20 0522  WBC 11.6*  --   --   --    < > 10.5  --  9.3 7.9 6.4 6.8  CREATININE 0.96  --  0.94  --    < > 1.00 1.07*  --  0.90 0.86 0.83  LATICACIDVEN 6.3* 4.1* 3.3* 1.9  --   --   --   --   --   --   --    < > = values in this interval not displayed.    Estimated Creatinine Clearance: 107.3 mL/min (by C-G formula based on SCr of 0.83 mg/dL).    Allergies  Allergen Reactions  . Penicillins Rash    Historical Has patient had a PCN reaction causing immediate rash, facial/tongue/throat swelling, SOB or lightheadedness with hypotension: No Has patient had a PCN reaction causing severe rash involving mucus membranes or skin necrosis: No Has patient had a PCN reaction that required hospitalization: No Has patient had a PCN reaction occurring within the last 10 years: No If all of the above answers are "NO", then may proceed with Cephalosporin use.    Antimicrobials this admission: Vancomycin 2/24 x1 Cefepime 2/22>>   Dose adjustments this admission: N/A  Microbiology results: BCx 2/22: ngtd  Trach aspirate 2/22: few normal flora   Antonietta Jewel, PharmD, [place] Clinical Pharmacist  Phone: [phone] 10/08/2020 9:13 AM  Please check AMION for all [place] phone numbers After 10:00  PM, call [place] [phone]

## 2020-10-08 NOTE — Progress Notes (Signed)
CARDIAC REHAB PHASE I   PRE:  Rate/Rhythm: 95 SR    BP: sitting 130/77    SaO2: 95 5L, 95 2 1/2L  MODE:  Ambulation: 140 ft   POST:  Rate/Rhythm: 117 ST    BP: sitting 133/82     SaO2: 92 3L, 98 2 1/2L  Pt eager to get out of bed. Mod assist needed to get standing however some anxiety. Used RW and 3 L to ambulate, slow and steady, standby assist. Tolerated well, no significant SOB or CP. To recliner. VSS. Left on 2 1/2L instead of 5L.   Discussed IS, sternal precautions, mobility post op, and d/c planning. Pt very receptive. Practiced IS, 700 mL. She has family to be with her at d/c. She does have 14 stairs to get in to her apartment. Will benefit from PT c/s post op. Materials given including preop video. [place], ACSM 10/08/2020 11:50 AM

## 2020-10-08 NOTE — Progress Notes (Signed)
Tiffany Le for IV heparin Indication: chest pain/ACS  Allergies  Allergen Reactions  . Penicillins Rash    Historical Has patient had a PCN reaction causing immediate rash, facial/tongue/throat swelling, SOB or lightheadedness with hypotension: No Has patient had a PCN reaction causing severe rash involving mucus membranes or skin necrosis: No Has patient had a PCN reaction that required hospitalization: No Has patient had a PCN reaction occurring within the last 10 years: No If all of the above answers are "NO", then may proceed with Cephalosporin use.    Patient Measurements: Weight: 114.5 kg (252 lb 6.8 oz) Heparin Dosing Weight: 80.6 kg  Vital Signs: Temp: 99.4 F (37.4 C) (02/28 0427) Temp Source: Oral (02/28 0427) BP: 131/74 (02/28 0427) Pulse Rate: 90 (02/27 2353)  Labs: Recent Labs    10/06/20 0600 10/06/20 1600 10/07/20 0523 10/08/20 0522  HGB 8.3*  --  7.9* 8.1*  HCT 25.9*  --  24.1* 26.0*  PLT 264  --  267 294  HEPARINUNFRC 0.25* 0.40 0.42 0.31  CREATININE 0.90  --  0.86 0.83    Estimated Creatinine Clearance: 107.3 mL/min (by C-G formula based on SCr of 0.83 mg/dL).   Medical History: Past Medical History:  Diagnosis Date  . Coronary artery disease   . Diabetes mellitus without complication ([place])   . Diabetic neuropathy ([place]) 02/2020  . Hyperlipidemia age 69  . Hypertension age 12  . Hypocalcemia 11/2019  . Hypokalemia 11/2019  . Iron deficiency anemia   . Obesity   . Proteinuria 12/09/2019    Medications:  Infusions:  . sodium chloride Stopped (10/04/20 2311)  . ceFEPime (MAXIPIME) IV 2 g (10/08/20 0707)  . heparin 1,600 Units/hr (10/08/20 0425)    Assessment: 45 y.o. female admitted with respiratory distress, intubated in the ED. Patient recently admitted for NSTEMI, found to have severe multivessel CAD, left AMA 09/27/20 after refusing CABG. Pt was also offered high risk PCI which was refused as well.  Pharmacy has been consulted to start heparin. Plans are for CABG when pt is more stable.   Heparin level is therapeutic at 0.31, on 1600 units/hr. Hgb 8.1, plt 294. No s/sx of bleeding or infusion issues.   Goal of Therapy:  Heparin level 0.3-0.7 units/ml Monitor platelets by anticoagulation protocol: Yes   Plan:  Continue IV heparin at 1,600 units/h Monitor daily HL, CBC and s/sx bleeding  Antonietta Jewel, PharmD, [place] Pharmacist  Phone: [phone] 10/08/2020 7:15 AM  Please check AMION for all [facility] phone numbers After 10:00 PM, call [place] [phone]

## 2020-10-08 NOTE — Progress Notes (Signed)
Physical Therapy Treatment Patient Details Name: Tiffany Le MRN: [id number] DOB: 17-Apr-1976 Today's Date: 10/08/2020    History of Present Illness Pt is a 45 y.o. female recently admitted 2/14-2/17/22 with NSTEMI and LHC with severe CAD, recommended CABG vs. PCI, but left AMA, pt now readmitted 10/02/20 with respiratory distress; ETT 2/22-2/23. Workup for acute hypoxic respiratory failure likely secondary to acute cardiogenic pulmonary edema, possible HCAP, pleural effusion. Plan for CABG 3/1. PMH includes HTN, DM2, CAD, HF.    PT Comments    Pt received up in chair, daughter present, pt A&O and agreeable to therapy session, with good participation and tolerance for mobility. Primary session focus on supine/standing BLE AROM therapeutic exercises for strengthening/pre-gait, pt fatigues quickly and needs seated rest break after 10 reps of one exercise. Pt making progress with transfers, able to stand while maintaining sternal precautions with mostly min guard, up to minA from chair. VSS on 3L O2  throughout and no dizziness reported. Pt continues to benefit from PT services to progress toward functional mobility goals. Continue to recommend HHPT, pending progress post-op.  Follow Up Recommendations  Home health PT;Supervision for mobility/OOB (pending progress post-op)     Equipment Recommendations  Other (comment) (TBD - rollator vs RW)    Recommendations for Other Services       Precautions / Restrictions Precautions Precautions: Fall Precaution Comments: verbal review for sternal precs for upcoming CABG Restrictions Weight Bearing Restrictions: No    Mobility  Bed Mobility               General bed mobility comments: received in chair, remained up in chair at end of session    Transfers Overall transfer level: Needs assistance Equipment used: Rolling walker (2 wheeled) Transfers: Sit to/from Stand Sit to Stand: Min assist;Min guard         General transfer  comment: from chair height to RW x3 trials, minA progressing to min guard, pt able to stand from chair with hands on knees each attempt for quad assist in preparation for sternal precautions  Ambulation/Gait Ambulation/Gait assistance: Min guard   Assistive device: Rolling walker (2 wheeled)       General Gait Details: pre-gait standing hip flexion with high knees at RW x10 reps ea x2 sets with seated break, pt able to step forward/backward in front of chair but deferred longer gait trial due to pt fatigue after STS x3 reps and pre-gait marching (pt ambulated with C.R. earlier in day)   Stairs             Wheelchair Mobility    Modified Rankin (Stroke Patients Only)       Balance Overall balance assessment: Needs assistance Sitting-balance support: No upper extremity supported;Feet supported Sitting balance-Leahy Scale: Fair     Standing balance support: Bilateral upper extremity supported Standing balance-Leahy Scale: Poor Standing balance comment: reliant on RW for support when marching in front of chair; able to stand up from chair without UE support                            Cognition Arousal/Alertness: Awake/alert Behavior During Therapy: WFL for tasks assessed/performed Overall Cognitive Status: Within Functional Limits for tasks assessed                                 General Comments: follows 1-2 step commands, cooperative  Exercises Other Exercises Other Exercises: supine BLE AROM: ankle pumps, quad sets, glute sets x5-10reps ea, standing at RW BLE AROM hip flexion 10 reps x2 sets    General Comments General comments (skin integrity, edema, etc.): HR 88 to 106 bpm during mobility; HR 151/82 (98) prior to standing, SpO2 WNL on 3L O2 [place]      Pertinent Vitals/Pain Pain Assessment: No/denies pain Faces Pain Scale: No hurt Pain Intervention(s): Monitored during session;Repositioned    Home Living                       Prior Function            PT Goals (current goals can now be found in the care plan section) Acute Rehab PT Goals Patient Stated Goal: to be able to go up steps to get into her apt after CABG PT Goal Formulation: With patient/family Time For Goal Achievement: 10/19/20 Potential to Achieve Goals: Good Progress towards PT goals: Progressing toward goals    Frequency    Min 3X/week      PT Plan Current plan remains appropriate    Co-evaluation              AM-PAC PT "6 Clicks" Mobility   Outcome Measure  Help needed turning from your back to your side while in a flat bed without using bedrails?: A Little Help needed moving from lying on your back to sitting on the side of a flat bed without using bedrails?: A Little Help needed moving to and from a bed to a chair (including a wheelchair)?: A Little Help needed standing up from a chair using your arms (e.g., wheelchair or bedside chair)?: A Little Help needed to walk in hospital room?: A Little Help needed climbing 3-5 steps with a railing? : A Lot 6 Click Score: 17    End of Session Equipment Utilized During Treatment: Oxygen Activity Tolerance: Patient tolerated treatment well;Patient limited by fatigue Patient left: in chair;with call bell/phone within reach;with family/visitor present (pillow under legs for elevation/to float heels) Nurse Communication: Mobility status PT Visit Diagnosis: Other abnormalities of gait and mobility (R26.89);Muscle weakness (generalized) (M62.81)     Time: 3736-6815 PT Time Calculation (min) (ACUTE ONLY): 27 min  Charges:  $Therapeutic Exercise: 23-37 mins                     Elice Crigger P., PTA Acute Rehabilitation Services Pager: [phone] Office: [place] 10/08/2020, 3:37 PM

## 2020-10-08 NOTE — Progress Notes (Signed)
Patient ID: Tiffany Le, female   DOB: [birth year], 45 y.o.   MRN: [id number]     Advanced Heart Failure Rounding Note  PCP-Cardiologist: Quay Burow, MD   Subjective:    Remains on cefepime for PNA. Afebrile this am. WBCs normal.   Co-ox 69% Remains on IV lasix 80 bid with a dose of metolazone yesterday. I/Os negative but weight unchanged and still with CVP 14.  Remains on oxygen.   On heparin. No bleeding  Objective:   Weight Range: 114.5 kg Body mass index is 43.33 kg/m.   Vital Signs:   Temp:  [98.2 F (36.8 C)-99.4 F (37.4 C)] 99.4 F (37.4 C) (02/28 0427) Pulse Rate:  [89-90] 90 (02/27 2353) Resp:  [18-22] 22 (02/28 0427) BP: (110-131)/(65-78) 131/74 (02/28 0427) SpO2:  [95 %-99 %] 95 % (02/28 0427) Weight:  [114.5 kg] 114.5 kg (02/28 0500) Last BM Date: 10/03/20  Weight change: Filed Weights   10/06/20 0034 10/07/20 0016 10/08/20 0500  Weight: 113.3 kg 114.5 kg 114.5 kg    Intake/Output:   Intake/Output Summary (Last 24 hours) at 10/08/2020 0909 Last data filed at 10/08/2020 0707 Gross per 24 hour  Intake 1707.63 ml  Output 3025 ml  Net -1317.37 ml      Physical Exam    CVP 14 General: NAD Neck: JVP 14+ cm, no thyromegaly or thyroid nodule.  Lungs: Clear to auscultation bilaterally with normal respiratory effort. CV: Nondisplaced PMI.  Heart regular S1/S2, no S3/S4, no murmur.  1+ edema to knees.   Abdomen: Soft, nontender, no hepatosplenomegaly, no distention.  Skin: Intact without lesions or rashes.  Neurologic: Alert and oriented x 3.  Psych: Normal affect. Extremities: No clubbing or cyanosis.  HEENT: Normal.    Telemetry   Sinus 80-90s. Personally reviewed  Labs    CBC Recent Labs    10/07/20 0523 10/08/20 0522  WBC 6.4 6.8  HGB 7.9* 8.1*  HCT 24.1* 26.0*  MCV 84.6 85.8  PLT 267 357   Basic Metabolic Panel Recent Labs    10/06/20 0600 10/07/20 0523 10/08/20 0522  NA 134* 134* 136  K 3.8 3.6 3.3*  CL 101 100 100   CO2 24 25 28   GLUCOSE 144* 267* 179*  BUN 27* 27* 23*  CREATININE 0.90 0.86 0.83  CALCIUM 8.0* 7.6* 8.0*  MG 2.4 2.2  --   PHOS 1.9*  --   --    Liver Function Tests Recent Labs    10/06/20 0600  ALBUMIN 2.2*   No results for input(s): LIPASE, AMYLASE in the last 72 hours. Cardiac Enzymes No results for input(s): CKTOTAL, CKMB, CKMBINDEX, TROPONINI in the last 72 hours.  BNP: BNP (last 3 results) Recent Labs    10/02/20 0516  BNP 947.1*    ProBNP (last 3 results) No results for input(s): PROBNP in the last 8760 hours.   D-Dimer No results for input(s): DDIMER in the last 72 hours. Hemoglobin A1C No results for input(s): HGBA1C in the last 72 hours. Fasting Lipid Panel No results for input(s): CHOL, HDL, LDLCALC, TRIG, CHOLHDL, LDLDIRECT in the last 72 hours. Thyroid Function Tests No results for input(s): TSH, T4TOTAL, T3FREE, THYROIDAB in the last 72 hours.  Invalid input(s): FREET3  Other results:   Imaging    No results found.   Medications:     Scheduled Medications: . amiodarone  200 mg Oral BID  . aspirin  81 mg Oral Daily  . atorvastatin  80 mg Oral Daily  .  chlorhexidine  15 mL Mouth Rinse BID  . Chlorhexidine Gluconate Cloth  6 each Topical Daily  . docusate  100 mg Oral BID  . furosemide  80 mg Intravenous Once  . insulin aspart  0-15 Units Subcutaneous TID WC  . isosorbide-hydrALAZINE  1 tablet Oral TID  . mouth rinse  15 mL Mouth Rinse BID  . mouth rinse  15 mL Mouth Rinse q12n4p  . metolazone  2.5 mg Oral Once  . pantoprazole  40 mg Oral QHS  . polyethylene glycol  17 g Per Tube Daily  . potassium chloride  40 mEq Oral Daily  . potassium chloride  40 mEq Oral Once  . sodium chloride flush  10-40 mL Intracatheter Q12H  . spironolactone  25 mg Oral Daily    Infusions: . sodium chloride Stopped (10/04/20 2311)  . ceFEPime (MAXIPIME) IV 2 g (10/08/20 0707)  . furosemide (LASIX) 200 mg in dextrose 5% 100 mL (2mg /mL) infusion    .  heparin 1,600 Units/hr (10/08/20 0425)    PRN Medications: sodium chloride, acetaminophen, hydrALAZINE, hydrOXYzine, levalbuterol, ondansetron (ZOFRAN) IV, polyethylene glycol, sodium chloride flush   Assessment/Plan   1. CAD: NSTEMI last admission, cath showed multivessel CAD (has strong FH of premature CAD as well as DM and HTN). CABG recommended but she left AMA.  She returned with marked dyspnea/pulmonary edema.  HS-TnI peaked at 3190.  ST elevation across precordium. Echo unchanged with EF 35-40%.  Case reviewed with interventional cardiology => she really does not have a good PCI option.  Plan is to stabilize and try to get her to CABG, ?tomorrow if she is better-diuresed.   - Continue ASA 81/high-intensity statin  - Remains heparin gtt. Discussed dosing with PharmD personally. - Held Plavix for CABG => P2Y12 level adequate now for CABG.  - Dr. Orvan Seen to operate.  2. Acute systolic CHF: Echo (3/66) with EF 35-40%, mild LVH (similar this admission and last).  Elevated lactate in setting of hypoxemia prior to intubation.  Remains on IV lasix.  Still volume overloaded with CVP 14, co-ox excellent at 69%. Renal function stable - Lasix 80 mg IV x 1 then gtt 12 mg/hr and will give dose of metolazone 2.5 x 1.  Replace K.  - Place TED hose.  - Continue Spironolactone 25 mg daily.  - Increase Bidil to 1 tab tid (avoid starting ARB/ARNI pre-CABG, will need after).  3. ID:  Concern for possible aspiration PNA.  CXR with bilateral infiltrates initially, now clearing with RUL infiltrate only (suspect she asymmetric edema or mucus plugging also). Now AF, normal WBCs. Cultures NGTD.  - Continue cefepime until surgery.  - Repeat CXR today.  4. Acute hypoxemic respiratory failure: Intubated with pulmonary edema and possible PNA. Extubated 2/23. Now off Bipap and on HFNC.  - Diuretics as above - Antibiotics.  - Encouraged OOB to chair. Incentive spirometer  - CXR PA/lateral.  5. Type 2 diabetes:  Control improved recently.  6. VT: Noted in ER though strips not available.  - no further VT on tele - Switched amiodarone to 200 bid to limit volume   Loralie Champagne MD 10/08/2020 9:09 AM

## 2020-10-08 NOTE — Progress Notes (Signed)
OT Cancellation Note  Patient Details Name: Tiffany Le MRN: [id number] DOB: October 28, 1975   Cancelled Treatment:    Reason Eval/Treat Not Completed: Patient declined, no reason specified;Other (comment) (Pt at xray earlier this AM and pt had just gotten lunch and was using the bathroom around 12:30p. Pt to be seen after CABG x4 at next available treatment time.)  Jefferey Pica, OTR/L Acute Rehabilitation Services Pager: [phone] Office: [phone]  Sayda Grable C 10/08/2020, 2:29 PM

## 2020-10-09 ENCOUNTER — Inpatient Hospital Stay (HOSPITAL_COMMUNITY): Payer: Medicaid Other | Admitting: Certified Registered"

## 2020-10-09 ENCOUNTER — Inpatient Hospital Stay (HOSPITAL_COMMUNITY)
Admission: EM | Disposition: A | Discharge status: Home Health | Payer: Self-pay | Source: Home / Self Care | Attending: Cardiothoracic Surgery

## 2020-10-09 ENCOUNTER — Inpatient Hospital Stay (HOSPITAL_COMMUNITY): Payer: Medicaid Other

## 2020-10-09 DIAGNOSIS — I4892 Unspecified atrial flutter: Secondary | ICD-10-CM

## 2020-10-09 DIAGNOSIS — Z951 Presence of aortocoronary bypass graft: Secondary | ICD-10-CM

## 2020-10-09 DIAGNOSIS — I2510 Atherosclerotic heart disease of native coronary artery without angina pectoris: Secondary | ICD-10-CM

## 2020-10-09 HISTORY — PX: CLIPPING OF ATRIAL APPENDAGE: SHX5773

## 2020-10-09 HISTORY — PX: CORONARY ARTERY BYPASS GRAFT: SHX141

## 2020-10-09 HISTORY — PX: RADIAL ARTERY HARVEST: SHX5067

## 2020-10-09 HISTORY — PX: TEE WITHOUT CARDIOVERSION: SHX5443

## 2020-10-09 LAB — BLOOD GAS, ARTERIAL
Acid-Base Excess: 4.1 mmol/L — ABNORMAL HIGH (ref 0.0–2.0)
Bicarbonate: 27.8 mmol/L (ref 20.0–28.0)
Drawn by: 270271
FIO2: 21
O2 Saturation: 95.9 %
Patient temperature: 37
pCO2 arterial: 39.1 mmHg (ref 32.0–48.0)
pH, Arterial: 7.465 — ABNORMAL HIGH (ref 7.350–7.450)
pO2, Arterial: 79.7 mmHg — ABNORMAL LOW (ref 83.0–108.0)

## 2020-10-09 LAB — GLUCOSE, CAPILLARY
Glucose-Capillary: 148 mg/dL — ABNORMAL HIGH (ref 70–99)
Glucose-Capillary: 72 mg/dL (ref 70–99)
Glucose-Capillary: 145 mg/dL — ABNORMAL HIGH (ref 70–99)
Glucose-Capillary: 148 mg/dL — ABNORMAL HIGH (ref 70–99)
Glucose-Capillary: 144 mg/dL — ABNORMAL HIGH (ref 70–99)
Glucose-Capillary: 147 mg/dL — ABNORMAL HIGH (ref 70–99)
Glucose-Capillary: 157 mg/dL — ABNORMAL HIGH (ref 70–99)
Glucose-Capillary: 72 mg/dL (ref 70–99)
Glucose-Capillary: 180 mg/dL — ABNORMAL HIGH (ref 70–99)
Glucose-Capillary: 199 mg/dL — ABNORMAL HIGH (ref 70–99)

## 2020-10-09 LAB — POCT I-STAT 7, (LYTES, BLD GAS, ICA,H+H)
Acid-Base Excess: 4 mmol/L — ABNORMAL HIGH (ref 0.0–2.0)
Acid-Base Excess: 6 mmol/L — ABNORMAL HIGH (ref 0.0–2.0)
Acid-Base Excess: 6 mmol/L — ABNORMAL HIGH (ref 0.0–2.0)
Acid-Base Excess: 3 mmol/L — ABNORMAL HIGH (ref 0.0–2.0)
Acid-Base Excess: 2 mmol/L (ref 0.0–2.0)
Acid-base deficit: 1 mmol/L (ref 0.0–2.0)
Acid-base deficit: 1 mmol/L (ref 0.0–2.0)
Bicarbonate: 29.7 mmol/L — ABNORMAL HIGH (ref 20.0–28.0)
Bicarbonate: 30.1 mmol/L — ABNORMAL HIGH (ref 20.0–28.0)
Bicarbonate: 30.4 mmol/L — ABNORMAL HIGH (ref 20.0–28.0)
Bicarbonate: 27.1 mmol/L (ref 20.0–28.0)
Bicarbonate: 28.6 mmol/L — ABNORMAL HIGH (ref 20.0–28.0)
Bicarbonate: 25.4 mmol/L (ref 20.0–28.0)
Bicarbonate: 23.9 mmol/L (ref 20.0–28.0)
Calcium, Ion: 1.12 mmol/L — ABNORMAL LOW (ref 1.15–1.40)
Calcium, Ion: 0.95 mmol/L — ABNORMAL LOW (ref 1.15–1.40)
Calcium, Ion: 1.04 mmol/L — ABNORMAL LOW (ref 1.15–1.40)
Calcium, Ion: 1.05 mmol/L — ABNORMAL LOW (ref 1.15–1.40)
Calcium, Ion: 1.13 mmol/L — ABNORMAL LOW (ref 1.15–1.40)
Calcium, Ion: 1.13 mmol/L — ABNORMAL LOW (ref 1.15–1.40)
Calcium, Ion: 1.04 mmol/L — ABNORMAL LOW (ref 1.15–1.40)
HCT: 26 % — ABNORMAL LOW (ref 36.0–46.0)
HCT: 20 % — ABNORMAL LOW (ref 36.0–46.0)
HCT: 25 % — ABNORMAL LOW (ref 36.0–46.0)
HCT: 26 % — ABNORMAL LOW (ref 36.0–46.0)
HCT: 28 % — ABNORMAL LOW (ref 36.0–46.0)
HCT: 30 % — ABNORMAL LOW (ref 36.0–46.0)
HCT: 30 % — ABNORMAL LOW (ref 36.0–46.0)
Hemoglobin: 8.8 g/dL — ABNORMAL LOW (ref 12.0–15.0)
Hemoglobin: 6.8 g/dL — CL (ref 12.0–15.0)
Hemoglobin: 8.5 g/dL — ABNORMAL LOW (ref 12.0–15.0)
Hemoglobin: 8.8 g/dL — ABNORMAL LOW (ref 12.0–15.0)
Hemoglobin: 9.5 g/dL — ABNORMAL LOW (ref 12.0–15.0)
Hemoglobin: 10.2 g/dL — ABNORMAL LOW (ref 12.0–15.0)
Hemoglobin: 10.2 g/dL — ABNORMAL LOW (ref 12.0–15.0)
O2 Saturation: 100 %
O2 Saturation: 100 %
O2 Saturation: 100 %
O2 Saturation: 100 %
O2 Saturation: 95 %
O2 Saturation: 98 %
O2 Saturation: 99 %
Patient temperature: 36.2
Patient temperature: 36.7
Patient temperature: 37
Potassium: 2.9 mmol/L — ABNORMAL LOW (ref 3.5–5.1)
Potassium: 3.4 mmol/L — ABNORMAL LOW (ref 3.5–5.1)
Potassium: 3.8 mmol/L (ref 3.5–5.1)
Potassium: 3.6 mmol/L (ref 3.5–5.1)
Potassium: 3.6 mmol/L (ref 3.5–5.1)
Potassium: 3.9 mmol/L (ref 3.5–5.1)
Potassium: 3.9 mmol/L (ref 3.5–5.1)
Sodium: 135 mmol/L (ref 135–145)
Sodium: 137 mmol/L (ref 135–145)
Sodium: 136 mmol/L (ref 135–145)
Sodium: 137 mmol/L (ref 135–145)
Sodium: 138 mmol/L (ref 135–145)
Sodium: 137 mmol/L (ref 135–145)
Sodium: 136 mmol/L (ref 135–145)
TCO2: 31 mmol/L (ref 22–32)
TCO2: 31 mmol/L (ref 22–32)
TCO2: 32 mmol/L (ref 22–32)
TCO2: 28 mmol/L (ref 22–32)
TCO2: 30 mmol/L (ref 22–32)
TCO2: 27 mmol/L (ref 22–32)
TCO2: 25 mmol/L (ref 22–32)
pCO2 arterial: 52.5 mmHg — ABNORMAL HIGH (ref 32.0–48.0)
pCO2 arterial: 40.3 mmHg (ref 32.0–48.0)
pCO2 arterial: 42.3 mmHg (ref 32.0–48.0)
pCO2 arterial: 41 mmHg (ref 32.0–48.0)
pCO2 arterial: 53.7 mmHg — ABNORMAL HIGH (ref 32.0–48.0)
pCO2 arterial: 47.3 mmHg (ref 32.0–48.0)
pCO2 arterial: 41.9 mmHg (ref 32.0–48.0)
pH, Arterial: 7.361 (ref 7.350–7.450)
pH, Arterial: 7.482 — ABNORMAL HIGH (ref 7.350–7.450)
pH, Arterial: 7.464 — ABNORMAL HIGH (ref 7.350–7.450)
pH, Arterial: 7.428 (ref 7.350–7.450)
pH, Arterial: 7.33 — ABNORMAL LOW (ref 7.350–7.450)
pH, Arterial: 7.336 — ABNORMAL LOW (ref 7.350–7.450)
pH, Arterial: 7.364 (ref 7.350–7.450)
pO2, Arterial: 272 mmHg — ABNORMAL HIGH (ref 83.0–108.0)
pO2, Arterial: 391 mmHg — ABNORMAL HIGH (ref 83.0–108.0)
pO2, Arterial: 373 mmHg — ABNORMAL HIGH (ref 83.0–108.0)
pO2, Arterial: 368 mmHg — ABNORMAL HIGH (ref 83.0–108.0)
pO2, Arterial: 82 mmHg — ABNORMAL LOW (ref 83.0–108.0)
pO2, Arterial: 107 mmHg (ref 83.0–108.0)
pO2, Arterial: 128 mmHg — ABNORMAL HIGH (ref 83.0–108.0)

## 2020-10-09 LAB — POCT I-STAT, CHEM 8
BUN: 23 mg/dL — ABNORMAL HIGH (ref 6–20)
BUN: 23 mg/dL — ABNORMAL HIGH (ref 6–20)
BUN: 21 mg/dL — ABNORMAL HIGH (ref 6–20)
BUN: 21 mg/dL — ABNORMAL HIGH (ref 6–20)
BUN: 22 mg/dL — ABNORMAL HIGH (ref 6–20)
Calcium, Ion: 1.18 mmol/L (ref 1.15–1.40)
Calcium, Ion: 1.18 mmol/L (ref 1.15–1.40)
Calcium, Ion: 1.04 mmol/L — ABNORMAL LOW (ref 1.15–1.40)
Calcium, Ion: 1.05 mmol/L — ABNORMAL LOW (ref 1.15–1.40)
Calcium, Ion: 1.06 mmol/L — ABNORMAL LOW (ref 1.15–1.40)
Chloride: 95 mmol/L — ABNORMAL LOW (ref 98–111)
Chloride: 96 mmol/L — ABNORMAL LOW (ref 98–111)
Chloride: 97 mmol/L — ABNORMAL LOW (ref 98–111)
Chloride: 99 mmol/L (ref 98–111)
Chloride: 99 mmol/L (ref 98–111)
Creatinine, Ser: 0.6 mg/dL (ref 0.44–1.00)
Creatinine, Ser: 0.6 mg/dL (ref 0.44–1.00)
Creatinine, Ser: 0.6 mg/dL (ref 0.44–1.00)
Creatinine, Ser: 0.6 mg/dL (ref 0.44–1.00)
Creatinine, Ser: 0.7 mg/dL (ref 0.44–1.00)
Glucose, Bld: 156 mg/dL — ABNORMAL HIGH (ref 70–99)
Glucose, Bld: 70 mg/dL (ref 70–99)
Glucose, Bld: 118 mg/dL — ABNORMAL HIGH (ref 70–99)
Glucose, Bld: 127 mg/dL — ABNORMAL HIGH (ref 70–99)
Glucose, Bld: 184 mg/dL — ABNORMAL HIGH (ref 70–99)
HCT: 24 % — ABNORMAL LOW (ref 36.0–46.0)
HCT: 23 % — ABNORMAL LOW (ref 36.0–46.0)
HCT: 24 % — ABNORMAL LOW (ref 36.0–46.0)
HCT: 26 % — ABNORMAL LOW (ref 36.0–46.0)
HCT: 26 % — ABNORMAL LOW (ref 36.0–46.0)
Hemoglobin: 8.2 g/dL — ABNORMAL LOW (ref 12.0–15.0)
Hemoglobin: 7.8 g/dL — ABNORMAL LOW (ref 12.0–15.0)
Hemoglobin: 8.2 g/dL — ABNORMAL LOW (ref 12.0–15.0)
Hemoglobin: 8.8 g/dL — ABNORMAL LOW (ref 12.0–15.0)
Hemoglobin: 8.8 g/dL — ABNORMAL LOW (ref 12.0–15.0)
Potassium: 2.9 mmol/L — ABNORMAL LOW (ref 3.5–5.1)
Potassium: 3.1 mmol/L — ABNORMAL LOW (ref 3.5–5.1)
Potassium: 4.1 mmol/L (ref 3.5–5.1)
Potassium: 3.6 mmol/L (ref 3.5–5.1)
Potassium: 3.9 mmol/L (ref 3.5–5.1)
Sodium: 136 mmol/L (ref 135–145)
Sodium: 137 mmol/L (ref 135–145)
Sodium: 135 mmol/L (ref 135–145)
Sodium: 135 mmol/L (ref 135–145)
Sodium: 137 mmol/L (ref 135–145)
TCO2: 28 mmol/L (ref 22–32)
TCO2: 26 mmol/L (ref 22–32)
TCO2: 30 mmol/L (ref 22–32)
TCO2: 30 mmol/L (ref 22–32)
TCO2: 30 mmol/L (ref 22–32)

## 2020-10-09 LAB — CBC
HCT: 24.6 % — ABNORMAL LOW (ref 36.0–46.0)
HCT: 29.3 % — ABNORMAL LOW (ref 36.0–46.0)
HCT: 29.7 % — ABNORMAL LOW (ref 36.0–46.0)
Hemoglobin: 8.1 g/dL — ABNORMAL LOW (ref 12.0–15.0)
Hemoglobin: 9.3 g/dL — ABNORMAL LOW (ref 12.0–15.0)
Hemoglobin: 9.9 g/dL — ABNORMAL LOW (ref 12.0–15.0)
MCH: 27.6 pg (ref 26.0–34.0)
MCH: 27 pg (ref 26.0–34.0)
MCH: 28 pg (ref 26.0–34.0)
MCHC: 32.9 g/dL (ref 30.0–36.0)
MCHC: 31.7 g/dL (ref 30.0–36.0)
MCHC: 33.3 g/dL (ref 30.0–36.0)
MCV: 84 fL (ref 80.0–100.0)
MCV: 84.9 fL (ref 80.0–100.0)
MCV: 83.9 fL (ref 80.0–100.0)
Platelets: 325 10*3/uL (ref 150–400)
Platelets: 227 10*3/uL (ref 150–400)
Platelets: 312 10*3/uL (ref 150–400)
RBC: 2.93 MIL/uL — ABNORMAL LOW (ref 3.87–5.11)
RBC: 3.45 MIL/uL — ABNORMAL LOW (ref 3.87–5.11)
RBC: 3.54 MIL/uL — ABNORMAL LOW (ref 3.87–5.11)
RDW: 13.5 % (ref 11.5–15.5)
RDW: 14.4 % (ref 11.5–15.5)
RDW: 14.6 % (ref 11.5–15.5)
WBC: 8.1 10*3/uL (ref 4.0–10.5)
WBC: 17.3 10*3/uL — ABNORMAL HIGH (ref 4.0–10.5)
WBC: 19.3 10*3/uL — ABNORMAL HIGH (ref 4.0–10.5)
nRBC: 0 % (ref 0.0–0.2)
nRBC: 0 % (ref 0.0–0.2)
nRBC: 0 % (ref 0.0–0.2)

## 2020-10-09 LAB — PROTIME-INR
INR: 1.4 — ABNORMAL HIGH (ref 0.8–1.2)
Prothrombin Time: 16.3 seconds — ABNORMAL HIGH (ref 11.4–15.2)

## 2020-10-09 LAB — COMPREHENSIVE METABOLIC PANEL
ALT: 31 U/L (ref 0–44)
AST: 26 U/L (ref 15–41)
Albumin: 2.2 g/dL — ABNORMAL LOW (ref 3.5–5.0)
Alkaline Phosphatase: 52 U/L (ref 38–126)
Anion gap: 10 (ref 5–15)
BUN: 22 mg/dL — ABNORMAL HIGH (ref 6–20)
CO2: 28 mmol/L (ref 22–32)
Calcium: 8.2 mg/dL — ABNORMAL LOW (ref 8.9–10.3)
Chloride: 96 mmol/L — ABNORMAL LOW (ref 98–111)
Creatinine, Ser: 0.81 mg/dL (ref 0.44–1.00)
GFR, Estimated: >60 mL/min (ref >60)
Glucose, Bld: 160 mg/dL — ABNORMAL HIGH (ref 70–99)
Potassium: 3.1 mmol/L — ABNORMAL LOW (ref 3.5–5.1)
Sodium: 134 mmol/L — ABNORMAL LOW (ref 135–145)
Total Bilirubin: 0.8 mg/dL (ref 0.3–1.2)
Total Protein: 6.5 g/dL (ref 6.5–8.1)

## 2020-10-09 LAB — HEMOGLOBIN AND HEMATOCRIT, BLOOD
HCT: 23.9 % — ABNORMAL LOW (ref 36.0–46.0)
Hemoglobin: 7.8 g/dL — ABNORMAL LOW (ref 12.0–15.0)

## 2020-10-09 LAB — POCT I-STAT EG7
Acid-Base Excess: 5 mmol/L — ABNORMAL HIGH (ref 0.0–2.0)
Bicarbonate: 29.2 mmol/L — ABNORMAL HIGH (ref 20.0–28.0)
Calcium, Ion: 1.06 mmol/L — ABNORMAL LOW (ref 1.15–1.40)
HCT: 21 % — ABNORMAL LOW (ref 36.0–46.0)
Hemoglobin: 7.1 g/dL — ABNORMAL LOW (ref 12.0–15.0)
O2 Saturation: 74 %
Potassium: 2.9 mmol/L — ABNORMAL LOW (ref 3.5–5.1)
Sodium: 138 mmol/L (ref 135–145)
TCO2: 31 mmol/L (ref 22–32)
pCO2, Ven: 42.5 mmHg — ABNORMAL LOW (ref 44.0–60.0)
pH, Ven: 7.446 — ABNORMAL HIGH (ref 7.250–7.430)
pO2, Ven: 38 mmHg (ref 32.0–45.0)

## 2020-10-09 LAB — COOXEMETRY PANEL
Carboxyhemoglobin: 1.2 % (ref 0.5–1.5)
Methemoglobin: 0.6 % (ref 0.0–1.5)
O2 Saturation: 68.8 %
Total hemoglobin: 8.3 g/dL — ABNORMAL LOW (ref 12.0–16.0)

## 2020-10-09 LAB — BASIC METABOLIC PANEL
Anion gap: 10 (ref 5–15)
BUN: 24 mg/dL — ABNORMAL HIGH (ref 6–20)
CO2: 23 mmol/L (ref 22–32)
Calcium: 7.8 mg/dL — ABNORMAL LOW (ref 8.9–10.3)
Chloride: 101 mmol/L (ref 98–111)
Creatinine, Ser: 0.9 mg/dL (ref 0.44–1.00)
GFR, Estimated: >60 mL/min (ref >60)
Glucose, Bld: 199 mg/dL — ABNORMAL HIGH (ref 70–99)
Potassium: 4.1 mmol/L (ref 3.5–5.1)
Sodium: 134 mmol/L — ABNORMAL LOW (ref 135–145)

## 2020-10-09 LAB — HEPARIN LEVEL (UNFRACTIONATED): Heparin Unfractionated: 0.49 IU/mL (ref 0.30–0.70)

## 2020-10-09 LAB — PLATELET COUNT: Platelets: 249 10*3/uL (ref 150–400)

## 2020-10-09 LAB — SURGICAL PCR SCREEN
MRSA, PCR: NEGATIVE
Staphylococcus aureus: NEGATIVE

## 2020-10-09 LAB — HEMOGLOBIN A1C
Hgb A1c MFr Bld: 7.5 % — ABNORMAL HIGH (ref 4.8–5.6)
Mean Plasma Glucose: 168.55 mg/dL

## 2020-10-09 LAB — PREPARE RBC (CROSSMATCH)

## 2020-10-09 LAB — APTT: aPTT: 35 seconds (ref 24–36)

## 2020-10-09 LAB — MAGNESIUM: Magnesium: 3.2 mg/dL — ABNORMAL HIGH (ref 1.7–2.4)

## 2020-10-09 SURGERY — CORONARY ARTERY BYPASS GRAFTING (CABG)
Anesthesia: General | Site: Chest

## 2020-10-09 MED ORDER — LACTATED RINGERS IV SOLN
INTRAVENOUS | Status: DC | PRN
Start: 2020-10-09 — End: 2020-10-09

## 2020-10-09 MED ORDER — FAMOTIDINE IN NACL 20-0.9 MG/50ML-% IV SOLN
20.0000 mg | Freq: Two times a day (BID) | INTRAVENOUS | Status: AC
  Administered 2020-10-09: 20 mg via INTRAVENOUS
  Filled 2020-10-09: qty 50

## 2020-10-09 MED ORDER — LEVOFLOXACIN IN D5W 750 MG/150ML IV SOLN
750.0000 mg | INTRAVENOUS | Status: AC
Start: 2020-10-10 — End: 2020-10-10
  Administered 2020-10-10: 750 mg via INTRAVENOUS
  Filled 2020-10-09: qty 150

## 2020-10-09 MED ORDER — FENTANYL CITRATE (PF) 250 MCG/5ML IJ SOLN
INTRAMUSCULAR | Status: DC | PRN
  Administered 2020-10-09 (×2): 50 ug via INTRAVENOUS
  Administered 2020-10-09: 100 ug via INTRAVENOUS
  Administered 2020-10-09 (×4): 50 ug via INTRAVENOUS
  Administered 2020-10-09: 450 ug via INTRAVENOUS
  Administered 2020-10-09: 50 ug via INTRAVENOUS
  Administered 2020-10-09: 100 ug via INTRAVENOUS
  Administered 2020-10-09: 50 ug via INTRAVENOUS

## 2020-10-09 MED ORDER — SODIUM CHLORIDE 0.9 % IV SOLN: INTRAVENOUS | Status: DC | PRN

## 2020-10-09 MED ORDER — PLATELET POOR PLASMA OPTIME
Status: DC | PRN
  Administered 2020-10-09: 10 mL

## 2020-10-09 MED ORDER — PROPOFOL 10 MG/ML IV BOLUS
INTRAVENOUS | Status: DC | PRN
  Administered 2020-10-09: 100 mg via INTRAVENOUS

## 2020-10-09 MED ORDER — SODIUM CHLORIDE 0.9% FLUSH: 3.0000 mL | INTRAVENOUS | Status: DC | PRN

## 2020-10-09 MED ORDER — PLASMA-LYTE 148 IV SOLN
INTRAVENOUS | Status: DC | PRN
  Administered 2020-10-09: 500 mL

## 2020-10-09 MED ORDER — VANCOMYCIN HCL 1000 MG IV SOLR
INTRAVENOUS | Status: AC
  Filled 2020-10-09: qty 3000

## 2020-10-09 MED ORDER — PROTAMINE SULFATE 10 MG/ML IV SOLN
INTRAVENOUS | Status: DC | PRN
  Administered 2020-10-09: 360 mg via INTRAVENOUS

## 2020-10-09 MED ORDER — OXYCODONE HCL 5 MG PO TABS
5.0000 mg | ORAL_TABLET | ORAL | Status: DC | PRN
  Administered 2020-10-10 – 2020-10-19 (×5): 10 mg via ORAL
  Administered 2020-10-20 (×2): 5 mg via ORAL
  Filled 2020-10-09 (×2): qty 2
  Filled 2020-10-09: qty 1
  Filled 2020-10-09 (×2): qty 2
  Filled 2020-10-09: qty 1
  Filled 2020-10-09: qty 2

## 2020-10-09 MED ORDER — MILRINONE LACTATE IN DEXTROSE 20-5 MG/100ML-% IV SOLN
0.1250 ug/kg/min | INTRAVENOUS | Status: DC
  Administered 2020-10-09: 17:00:00 0.3503 ug/kg/min via INTRAVENOUS
  Administered 2020-10-10: 0.25 ug/kg/min via INTRAVENOUS
  Administered 2020-10-11: 0.125 ug/kg/min via INTRAVENOUS
  Administered 2020-10-12 – 2020-10-15 (×7): 0.25 ug/kg/min via INTRAVENOUS
  Administered 2020-10-16: 0.125 ug/kg/min via INTRAVENOUS
  Filled 2020-10-09 (×12): qty 100

## 2020-10-09 MED ORDER — SODIUM CHLORIDE 0.9 % IV SOLN: 250.0000 mL | INTRAVENOUS | Status: DC

## 2020-10-09 MED ORDER — DEXMEDETOMIDINE HCL IN NACL 400 MCG/100ML IV SOLN
0.0000 ug/kg/h | INTRAVENOUS | Status: DC
  Administered 2020-10-09: 0.5 ug/kg/h via INTRAVENOUS
  Filled 2020-10-09: qty 100

## 2020-10-09 MED ORDER — NITROGLYCERIN IN D5W 200-5 MCG/ML-% IV SOLN: 0.0000 ug/min | INTRAVENOUS | Status: DC

## 2020-10-09 MED ORDER — ASPIRIN EC 325 MG PO TBEC: 325.0000 mg | DELAYED_RELEASE_TABLET | Freq: Every day | ORAL | Status: DC

## 2020-10-09 MED ORDER — MIDAZOLAM HCL 2 MG/2ML IJ SOLN
INTRAMUSCULAR | Status: AC
  Administered 2020-10-09: 2 mg via INTRAVENOUS
  Filled 2020-10-09: qty 2

## 2020-10-09 MED ORDER — METOPROLOL TARTRATE 12.5 MG HALF TABLET: 12.5000 mg | ORAL_TABLET | Freq: Two times a day (BID) | ORAL | Status: DC

## 2020-10-09 MED ORDER — LACTATED RINGERS IV SOLN: INTRAVENOUS | Status: DC | PRN

## 2020-10-09 MED ORDER — EPINEPHRINE HCL 5 MG/250ML IV SOLN IN NS
0.5000 ug/min | INTRAVENOUS | Status: DC
  Filled 2020-10-09: qty 250

## 2020-10-09 MED ORDER — ALBUMIN HUMAN 5 % IV SOLN
250.0000 mL | INTRAVENOUS | Status: AC | PRN
  Administered 2020-10-09: 12.5 g via INTRAVENOUS

## 2020-10-09 MED ORDER — PLATELET RICH PLASMA OPTIME
Status: DC | PRN
Start: 2020-10-09 — End: 2020-10-09
  Administered 2020-10-09: 10 mL

## 2020-10-09 MED ORDER — CHLORHEXIDINE GLUCONATE CLOTH 2 % EX PADS
6.0000 | MEDICATED_PAD | Freq: Every day | CUTANEOUS | Status: DC
  Administered 2020-10-09 – 2020-10-26 (×16): 6 via TOPICAL

## 2020-10-09 MED ORDER — SODIUM CHLORIDE 0.9 % IV SOLN: INTRAVENOUS | Status: DC

## 2020-10-09 MED ORDER — STERILE WATER FOR INJECTION IJ SOLN
INTRAMUSCULAR | Status: AC
  Filled 2020-10-09: qty 10

## 2020-10-09 MED ORDER — SODIUM CHLORIDE 0.9% FLUSH
3.0000 mL | Freq: Two times a day (BID) | INTRAVENOUS | Status: DC
  Administered 2020-10-10 – 2020-10-11 (×2): 3 mL via INTRAVENOUS

## 2020-10-09 MED ORDER — DEXMEDETOMIDINE HCL IN NACL 400 MCG/100ML IV SOLN
0.4000 ug/kg/h | INTRAVENOUS | Status: DC
  Filled 2020-10-09: qty 100

## 2020-10-09 MED ORDER — HEPARIN SODIUM (PORCINE) 1000 UNIT/ML IJ SOLN
INTRAMUSCULAR | Status: DC | PRN
  Administered 2020-10-09: 36000 [IU] via INTRAVENOUS

## 2020-10-09 MED ORDER — DEXTROSE 50 % IV SOLN
0.0000 mL | INTRAVENOUS | Status: DC | PRN
  Administered 2020-10-23: 50 mL via INTRAVENOUS
  Filled 2020-10-09: qty 50

## 2020-10-09 MED ORDER — LACTATED RINGERS IV SOLN: INTRAVENOUS | Status: DC

## 2020-10-09 MED ORDER — ACETAMINOPHEN 160 MG/5ML PO SOLN
1000.0000 mg | Freq: Four times a day (QID) | ORAL | Status: AC
  Administered 2020-10-11 – 2020-10-14 (×10): 1000 mg
  Filled 2020-10-09 (×11): qty 40.6

## 2020-10-09 MED ORDER — TRAMADOL HCL 50 MG PO TABS
50.0000 mg | ORAL_TABLET | ORAL | Status: DC | PRN
  Administered 2020-10-18 – 2020-10-23 (×2): 100 mg via ORAL
  Filled 2020-10-09: qty 1
  Filled 2020-10-09 (×2): qty 2

## 2020-10-09 MED ORDER — 0.9 % SODIUM CHLORIDE (POUR BTL) OPTIME
TOPICAL | Status: DC | PRN
  Administered 2020-10-09: 5000 mL

## 2020-10-09 MED ORDER — METOPROLOL TARTRATE 5 MG/5ML IV SOLN
2.5000 mg | INTRAVENOUS | Status: DC | PRN
  Filled 2020-10-09: qty 5

## 2020-10-09 MED ORDER — AMIODARONE IV BOLUS ONLY 150 MG/100ML
150.0000 mg | Freq: Once | INTRAVENOUS | Status: DC
  Filled 2020-10-09: qty 100

## 2020-10-09 MED ORDER — TRANEXAMIC ACID-NACL 1000-0.7 MG/100ML-% IV SOLN
1000.0000 mg | INTRAVENOUS | Status: DC
  Filled 2020-10-09: qty 100

## 2020-10-09 MED ORDER — METOPROLOL TARTRATE 25 MG/10 ML ORAL SUSPENSION: 12.5000 mg | Freq: Two times a day (BID) | ORAL | Status: DC

## 2020-10-09 MED ORDER — MIDAZOLAM HCL 2 MG/2ML IJ SOLN
2.0000 mg | INTRAMUSCULAR | Status: DC | PRN
  Filled 2020-10-09: qty 2

## 2020-10-09 MED ORDER — HEPARIN SODIUM (PORCINE) 1000 UNIT/ML IJ SOLN
INTRAMUSCULAR | Status: AC
  Filled 2020-10-09: qty 1

## 2020-10-09 MED ORDER — PHENYLEPHRINE HCL-NACL 20-0.9 MG/250ML-% IV SOLN
0.0000 ug/min | INTRAVENOUS | Status: DC
  Administered 2020-10-09: 40 ug/min via INTRAVENOUS
  Administered 2020-10-10: 20 ug/min via INTRAVENOUS
  Administered 2020-10-10: 25 ug/min via INTRAVENOUS
  Filled 2020-10-09 (×3): qty 250

## 2020-10-09 MED ORDER — ACETAMINOPHEN 500 MG PO TABS
1000.0000 mg | ORAL_TABLET | Freq: Four times a day (QID) | ORAL | Status: AC
  Administered 2020-10-10 – 2020-10-12 (×10): 1000 mg via ORAL
  Filled 2020-10-09 (×10): qty 2

## 2020-10-09 MED ORDER — ACETAMINOPHEN 650 MG RE SUPP: 650.0000 mg | Freq: Once | RECTAL | Status: AC

## 2020-10-09 MED ORDER — ALBUMIN HUMAN 5 % IV SOLN: INTRAVENOUS | Status: DC | PRN

## 2020-10-09 MED ORDER — MIDAZOLAM HCL 5 MG/5ML IJ SOLN
INTRAMUSCULAR | Status: DC | PRN
  Administered 2020-10-09: 2 mg via INTRAVENOUS
  Administered 2020-10-09: 3 mg via INTRAVENOUS
  Administered 2020-10-09: 5 mg via INTRAVENOUS

## 2020-10-09 MED ORDER — BUPIVACAINE LIPOSOME 1.3 % IJ SUSP
INTRAMUSCULAR | Status: DC | PRN
  Administered 2020-10-09: 50 mL

## 2020-10-09 MED ORDER — METHYLPREDNISOLONE SODIUM SUCC 125 MG IJ SOLR
60.0000 mg | Freq: Once | INTRAMUSCULAR | Status: AC
  Administered 2020-10-09: 60 mg via INTRAVENOUS
  Filled 2020-10-09: qty 2

## 2020-10-09 MED ORDER — MAGNESIUM SULFATE 4 GM/100ML IV SOLN
4.0000 g | Freq: Once | INTRAVENOUS | Status: AC
  Administered 2020-10-09: 4 g via INTRAVENOUS
  Filled 2020-10-09: qty 100

## 2020-10-09 MED ORDER — ONDANSETRON HCL 4 MG/2ML IJ SOLN
4.0000 mg | Freq: Four times a day (QID) | INTRAMUSCULAR | Status: DC | PRN
  Administered 2020-10-11 – 2020-10-21 (×7): 4 mg via INTRAVENOUS
  Filled 2020-10-09 (×7): qty 2

## 2020-10-09 MED ORDER — PANTOPRAZOLE SODIUM 40 MG PO TBEC
40.0000 mg | DELAYED_RELEASE_TABLET | Freq: Every day | ORAL | Status: DC
  Administered 2020-10-11 – 2020-10-20 (×10): 40 mg via ORAL
  Filled 2020-10-09 (×10): qty 1

## 2020-10-09 MED ORDER — MIDAZOLAM HCL (PF) 10 MG/2ML IJ SOLN
INTRAMUSCULAR | Status: AC
  Filled 2020-10-09: qty 2

## 2020-10-09 MED ORDER — SODIUM CHLORIDE 0.45 % IV SOLN: INTRAVENOUS | Status: DC | PRN

## 2020-10-09 MED ORDER — VANCOMYCIN HCL IN DEXTROSE 1-5 GM/200ML-% IV SOLN
1000.0000 mg | Freq: Once | INTRAVENOUS | Status: AC
  Administered 2020-10-09: 1000 mg via INTRAVENOUS
  Filled 2020-10-09: qty 200

## 2020-10-09 MED ORDER — PROPOFOL 10 MG/ML IV BOLUS
INTRAVENOUS | Status: AC
  Filled 2020-10-09: qty 40

## 2020-10-09 MED ORDER — INSULIN REGULAR(HUMAN) IN NACL 100-0.9 UT/100ML-% IV SOLN
INTRAVENOUS | Status: AC
  Administered 2020-10-10: 4.4 [IU]/h via INTRAVENOUS
  Filled 2020-10-09: qty 100

## 2020-10-09 MED ORDER — BUPIVACAINE LIPOSOME 1.3 % IJ SUSP
20.0000 mL | Freq: Once | INTRAMUSCULAR | Status: DC
  Filled 2020-10-09: qty 20

## 2020-10-09 MED ORDER — BUPIVACAINE HCL (PF) 0.5 % IJ SOLN
INTRAMUSCULAR | Status: AC
  Filled 2020-10-09: qty 30

## 2020-10-09 MED ORDER — ASPIRIN 81 MG PO CHEW: 324.0000 mg | CHEWABLE_TABLET | Freq: Every day | ORAL | Status: DC

## 2020-10-09 MED ORDER — THROMBIN 5000 UNITS EX SOLR
INTRAVENOUS | Status: DC | PRN
  Administered 2020-10-09: 2 mL

## 2020-10-09 MED ORDER — ACETAMINOPHEN 160 MG/5ML PO SOLN
650.0000 mg | Freq: Once | ORAL | Status: AC
  Administered 2020-10-09: 650 mg
  Filled 2020-10-09: qty 20.3

## 2020-10-09 MED ORDER — POTASSIUM CHLORIDE 10 MEQ/50ML IV SOLN
10.0000 meq | INTRAVENOUS | Status: AC
  Administered 2020-10-09 (×3): 10 meq via INTRAVENOUS

## 2020-10-09 MED ORDER — ROCURONIUM BROMIDE 10 MG/ML (PF) SYRINGE
PREFILLED_SYRINGE | INTRAVENOUS | Status: DC | PRN
  Administered 2020-10-09: 70 mg via INTRAVENOUS
  Administered 2020-10-09: 100 mg via INTRAVENOUS
  Administered 2020-10-09 (×2): 30 mg via INTRAVENOUS

## 2020-10-09 MED ORDER — DOCUSATE SODIUM 100 MG PO CAPS
200.0000 mg | ORAL_CAPSULE | Freq: Every day | ORAL | Status: DC
  Administered 2020-10-10 – 2020-10-25 (×8): 200 mg via ORAL
  Filled 2020-10-09 (×10): qty 2

## 2020-10-09 MED ORDER — PROTAMINE SULFATE 10 MG/ML IV SOLN
INTRAVENOUS | Status: AC
  Filled 2020-10-09: qty 10

## 2020-10-09 MED ORDER — BISACODYL 10 MG RE SUPP
10.0000 mg | Freq: Every day | RECTAL | Status: DC
  Filled 2020-10-09: qty 1

## 2020-10-09 MED ORDER — HEMOSTATIC AGENTS (NO CHARGE) OPTIME
TOPICAL | Status: DC | PRN
  Administered 2020-10-09 (×2): 1 via TOPICAL

## 2020-10-09 MED ORDER — MORPHINE SULFATE (PF) 2 MG/ML IV SOLN
1.0000 mg | INTRAVENOUS | Status: DC | PRN
  Administered 2020-10-09 – 2020-10-11 (×3): 2 mg via INTRAVENOUS
  Filled 2020-10-09 (×3): qty 1

## 2020-10-09 MED ORDER — PHENYLEPHRINE 40 MCG/ML (10ML) SYRINGE FOR IV PUSH (FOR BLOOD PRESSURE SUPPORT)
PREFILLED_SYRINGE | INTRAVENOUS | Status: DC | PRN
  Administered 2020-10-09: 40 ug via INTRAVENOUS
  Administered 2020-10-09: 80 ug via INTRAVENOUS

## 2020-10-09 MED ORDER — ATORVASTATIN CALCIUM 80 MG PO TABS
80.0000 mg | ORAL_TABLET | Freq: Every day | ORAL | Status: DC
  Administered 2020-10-10 – 2020-10-27 (×18): 80 mg via ORAL
  Filled 2020-10-09 (×4): qty 1
  Filled 2020-10-09: qty 2
  Filled 2020-10-09: qty 1
  Filled 2020-10-09: qty 2
  Filled 2020-10-09 (×7): qty 1
  Filled 2020-10-09: qty 2
  Filled 2020-10-09: qty 1
  Filled 2020-10-09: qty 2
  Filled 2020-10-09: qty 1

## 2020-10-09 MED ORDER — FENTANYL CITRATE (PF) 250 MCG/5ML IJ SOLN
INTRAMUSCULAR | Status: AC
  Filled 2020-10-09: qty 25

## 2020-10-09 MED ORDER — PROTAMINE SULFATE 10 MG/ML IV SOLN
INTRAVENOUS | Status: AC
  Filled 2020-10-09: qty 25

## 2020-10-09 MED ORDER — LACTATED RINGERS IV SOLN: 500.0000 mL | Freq: Once | INTRAVENOUS | Status: DC | PRN

## 2020-10-09 MED ORDER — VANCOMYCIN HCL 1000 MG IV SOLR
INTRAVENOUS | Status: DC | PRN
  Administered 2020-10-09: 3 g

## 2020-10-09 MED ORDER — BISACODYL 5 MG PO TBEC
10.0000 mg | DELAYED_RELEASE_TABLET | Freq: Every day | ORAL | Status: DC
  Administered 2020-10-10 – 2020-10-25 (×9): 10 mg via ORAL
  Filled 2020-10-09 (×11): qty 2

## 2020-10-09 MED ORDER — CHLORHEXIDINE GLUCONATE 0.12 % MT SOLN
15.0000 mL | OROMUCOSAL | Status: AC
  Administered 2020-10-09: 15 mL via OROMUCOSAL

## 2020-10-09 SURGICAL SUPPLY — 92 items
ADH SKN CLS APL DERMABOND .7 (GAUZE/BANDAGES/DRESSINGS)
APL SRG 7X2 LUM MLBL SLNT (VASCULAR PRODUCTS) ×6
APPLICATOR TIP COSEAL (VASCULAR PRODUCTS) ×2 IMPLANT
ATRICLIP EXCLUSION VLAA 35 (Miscellaneous) ×4 IMPLANT
BAG DECANTER FOR FLEXI CONT (MISCELLANEOUS) ×4 IMPLANT
BATTERY MAXDRIVER (MISCELLANEOUS) ×1 IMPLANT
BINDER BREAST XXLRG (GAUZE/BANDAGES/DRESSINGS) ×1 IMPLANT
BLADE CLIPPER SURG (BLADE) ×4 IMPLANT
BLADE STERNUM SYSTEM 6 (BLADE) ×4 IMPLANT
BLADE SURG 15 STRL LF DISP TIS (BLADE) ×3 IMPLANT
BLADE SURG 15 STRL SS (BLADE) ×4
BNDG ELASTIC 4X5.8 VLCR STR LF (GAUZE/BANDAGES/DRESSINGS) ×4 IMPLANT
BNDG GAUZE ELAST 4 BULKY (GAUZE/BANDAGES/DRESSINGS) ×4 IMPLANT
CANISTER SUCT 3000ML PPV (MISCELLANEOUS) ×4 IMPLANT
CANISTER WOUND CARE 500ML ATS (WOUND CARE) ×1 IMPLANT
CATH CPB KIT HENDRICKSON (MISCELLANEOUS) ×4 IMPLANT
CATH ROBINSON RED A/P 18FR (CATHETERS) ×8 IMPLANT
CLIP RETRACTION 3.0MM CORONARY (MISCELLANEOUS) ×3 IMPLANT
CLIP VESOCCLUDE SM WIDE 24/CT (CLIP) ×12 IMPLANT
COVER MAYO STAND STRL (DRAPES) ×4 IMPLANT
DERMABOND ADVANCED (GAUZE/BANDAGES/DRESSINGS)
DERMABOND ADVANCED .7 DNX12 (GAUZE/BANDAGES/DRESSINGS) ×3 IMPLANT
DEVICE EXCLUSIN ATRCLP VLAA 35 (Miscellaneous) IMPLANT
DRAIN CHANNEL 28F RND 3/8 FF (WOUND CARE) ×13 IMPLANT
DRAPE CARDIOVASCULAR INCISE (DRAPES) ×4
DRAPE EXTREMITY T 121X128X90 (DISPOSABLE) ×4 IMPLANT
DRAPE HALF SHEET 40X57 (DRAPES) ×4 IMPLANT
DRAPE INCISE IOBAN 66X45 STRL (DRAPES) ×1 IMPLANT
DRAPE SLUSH/WARMER DISC (DRAPES) ×4 IMPLANT
DRAPE SRG 135X102X78XABS (DRAPES) ×3 IMPLANT
DRESSING PEEL AND PLAC PRVNA20 (GAUZE/BANDAGES/DRESSINGS) IMPLANT
DRSG PEEL AND PLACE PREVENA 20 (GAUZE/BANDAGES/DRESSINGS) ×4
ELECT CAUTERY BLADE 6.4 (BLADE) ×4 IMPLANT
ELECT REM PT RETURN 9FT ADLT (ELECTROSURGICAL) ×8
ELECTRODE REM PT RTRN 9FT ADLT (ELECTROSURGICAL) ×6 IMPLANT
FELT TEFLON 1X6 (MISCELLANEOUS) ×7 IMPLANT
GAUZE SPONGE 4X4 12PLY STRL (GAUZE/BANDAGES/DRESSINGS) ×8 IMPLANT
GAUZE SPONGE 4X4 12PLY STRL LF (GAUZE/BANDAGES/DRESSINGS) ×1 IMPLANT
GOWN STRL REUS W/ TWL LRG LVL3 (GOWN DISPOSABLE) ×12 IMPLANT
GOWN STRL REUS W/TWL LRG LVL3 (GOWN DISPOSABLE) ×16
INSERT FOGARTY XLG (MISCELLANEOUS) ×4 IMPLANT
INSERT SUTURE HOLDER (MISCELLANEOUS) ×4 IMPLANT
KIT APPLICATOR RATIO 11:1 (KITS) ×1 IMPLANT
KIT BASIN OR (CUSTOM PROCEDURE TRAY) ×4 IMPLANT
KIT SUCTION CATH 14FR (SUCTIONS) ×4 IMPLANT
KIT TURNOVER KIT B (KITS) ×4 IMPLANT
NDL 18GX1X1/2 (RX/OR ONLY) (NEEDLE) ×3 IMPLANT
NEEDLE 18GX1X1/2 (RX/OR ONLY) (NEEDLE) ×4 IMPLANT
NS IRRIG 1000ML POUR BTL (IV SOLUTION) ×20 IMPLANT
PACK E OPEN HEART (SUTURE) ×4 IMPLANT
PACK OPEN HEART (CUSTOM PROCEDURE TRAY) ×4 IMPLANT
PACK PLATELET PROCEDURE 60 (MISCELLANEOUS) ×1 IMPLANT
PACK SPY-PHI (KITS) ×1 IMPLANT
PAD ARMBOARD 7.5X6 YLW CONV (MISCELLANEOUS) ×8 IMPLANT
PAD ELECT DEFIB RADIOL ZOLL (MISCELLANEOUS) ×4 IMPLANT
PENCIL BUTTON HOLSTER BLD 10FT (ELECTRODE) ×4 IMPLANT
PLATE STERNAL 2.3X208 14H 2-PK (Plate) ×1 IMPLANT
POSITIONER HEAD DONUT 9IN (MISCELLANEOUS) ×4 IMPLANT
POWDER SURGICEL 3.0 GRAM (HEMOSTASIS) ×4 IMPLANT
PUNCH AORTIC ROTATE 4.5MM 8IN (MISCELLANEOUS) ×1 IMPLANT
SCREW LOCKING TI 2.3X13MM (Screw) ×4 IMPLANT
SCREW STERNAL 2.3X17MM (Screw) ×6 IMPLANT
SEALANT SURG COSEAL 8ML (VASCULAR PRODUCTS) ×1 IMPLANT
SET CARDIOPLEGIA MPS 5001102 (MISCELLANEOUS) ×1 IMPLANT
SHEARS HARMONIC 9CM CVD (BLADE) ×1 IMPLANT
STAPLER VISISTAT 35W (STAPLE) ×2 IMPLANT
SUT BONE WAX W31G (SUTURE) ×4 IMPLANT
SUT MNCRL AB 3-0 PS2 18 (SUTURE) ×8 IMPLANT
SUT PDS AB 1 CTX 36 (SUTURE) ×8 IMPLANT
SUT PROLENE 3 0 SH DA (SUTURE) ×4 IMPLANT
SUT PROLENE 5 0 C 1 36 (SUTURE) IMPLANT
SUT PROLENE 6 0 C 1 30 (SUTURE) ×11 IMPLANT
SUT PROLENE 8 0 BV175 6 (SUTURE) IMPLANT
SUT PROLENE BLUE 7 0 (SUTURE) ×4 IMPLANT
SUT SILK  1 MH (SUTURE) ×8
SUT SILK 1 MH (SUTURE) IMPLANT
SUT SILK 2 0 SH CR/8 (SUTURE) IMPLANT
SUT SILK 3 0 SH CR/8 (SUTURE) IMPLANT
SUT STEEL 6MS V (SUTURE) ×4 IMPLANT
SUT STEEL SZ 6 DBL 3X14 BALL (SUTURE) ×4 IMPLANT
SUT VIC AB 2-0 CT1 27 (SUTURE) ×4
SUT VIC AB 2-0 CT1 TAPERPNT 27 (SUTURE) IMPLANT
SUT VIC AB 2-0 CTX 27 (SUTURE) IMPLANT
SUT VIC AB 3-0 X1 27 (SUTURE) IMPLANT
SYR 10ML LL (SYRINGE) ×1 IMPLANT
SYSTEM SAHARA CHEST DRAIN ATS (WOUND CARE) ×4 IMPLANT
TIP DUAL SPRAY TOPICAL (TIP) ×2 IMPLANT
TOWEL GREEN STERILE (TOWEL DISPOSABLE) ×4 IMPLANT
TOWEL GREEN STERILE FF (TOWEL DISPOSABLE) ×4 IMPLANT
TRAY FOLEY SLVR 14FR TEMP STAT (SET/KITS/TRAYS/PACK) ×1 IMPLANT
UNDERPAD 30X36 HEAVY ABSORB (UNDERPADS AND DIAPERS) ×4 IMPLANT
WATER STERILE IRR 1000ML POUR (IV SOLUTION) ×8 IMPLANT

## 2020-10-09 NOTE — Progress Notes (Signed)
      [address]       [place] [address]             [phone]      S/p CABG x 4  Intubated, awake and responding  BP 125/75 (BP Location: Left Arm)   Pulse 89   Temp (!) 97.52 F (36.4 C)   Resp 15   Wt 111.9 kg   LMP  (LMP Unknown)   SpO2 100%   BMI 42.35 kg/m   29/17 CI= 2.4 on milrinone 0.35, epi @ 2  Intake/Output Summary (Last 24 hours) at 10/09/2020 1800 Last data filed at 10/09/2020 1700 Gross per 24 hour  Intake 3604.37 ml  Output 1578 ml  Net 2026.37 ml   Minimal CT output CBG mildly elevated  Doing well early postop  Remo Lipps C. Roxan Hockey, MD [facility] [phone]

## 2020-10-09 NOTE — Anesthesia Preprocedure Evaluation (Signed)
Anesthesia Evaluation  Patient identified by MRN, date of birth, ID band Patient awake    Reviewed: Allergy & Precautions, H&P , NPO status , Patient's Chart, lab work & pertinent test results  Airway Mallampati: II   Neck ROM: full    Dental   Pulmonary shortness of breath,    breath sounds clear to auscultation       Cardiovascular hypertension, + angina + CAD, + Past MI and +CHF   Rhythm:regular Rate:Normal  EF 40%. Anterior and apical HK.   Neuro/Psych    GI/Hepatic   Endo/Other  diabetes, Type 2Morbid obesity  Renal/GU      Musculoskeletal   Abdominal   Peds  Hematology  (+) Blood dyscrasia, anemia ,   Anesthesia Other Findings   Reproductive/Obstetrics                             Anesthesia Physical Anesthesia Plan  ASA: III  Anesthesia Plan: General   Post-op Pain Management:    Induction: Intravenous  PONV Risk Score and Plan: 3 and Ondansetron, Dexamethasone, Midazolam and Treatment may vary due to age or medical condition  Airway Management Planned: Oral ETT  Additional Equipment: Arterial line, CVP, PA Cath, TEE and Ultrasound Guidance Line Placement  Intra-op Plan:   Post-operative Plan: Extubation in OR  Informed Consent: I have reviewed the patients History and Physical, chart, labs and discussed the procedure including the risks, benefits and alternatives for the proposed anesthesia with the patient or authorized representative who has indicated his/her understanding and acceptance.     Dental advisory given  Plan Discussed with: CRNA, Anesthesiologist and Surgeon  Anesthesia Plan Comments:         Anesthesia Quick Evaluation

## 2020-10-09 NOTE — Transfer of Care (Signed)
Immediate Anesthesia Transfer of Care Note  Patient: Tiffany Le  Procedure(s) Performed: CORONARY ARTERY BYPASS GRAFTING (CABG), ON PUMP, TIMES FOUR, USING LEFT INTERNAL MAMMARY ARTERY AND LEFT RADIAL ARTERY (OPEN HARVEST) (N/A Chest) RADIAL ARTERY HARVEST (Left Arm Lower) TRANSESOPHAGEAL ECHOCARDIOGRAM (TEE) (N/A ) INDOCYANINE GREEN FLUORESCENCE IMAGING (ICG) (N/A ) CLIPPING OF ATRIAL APPENDAGE USING ATRICURE 35MM CLIP (N/A Chest)  Patient Location: ICU  Anesthesia Type:General  Level of Consciousness: Patient remains intubated per anesthesia plan  Airway & Oxygen Therapy: Patient remains intubated per anesthesia plan and Patient placed on Ventilator (see vital sign flow sheet for setting)  Post-op Assessment: Report given to RN and Post -op Vital signs reviewed and stable  Post vital signs: Reviewed and stable  Last Vitals:  Vitals Value Taken Time  BP    Temp    Pulse    Resp    SpO2      Last Pain:  Vitals:   10/09/20 0450  TempSrc: Oral  PainSc:          Complications: No complications documented.

## 2020-10-09 NOTE — Anesthesia Procedure Notes (Signed)
Central Venous Catheter Insertion Performed by: Albertha Ghee, MD, anesthesiologist Start/End3/08/2020 7:01 AM, 10/09/2020 7:14 AM Patient location: Pre-op. Preanesthetic checklist: patient identified, IV checked, site marked, risks and benefits discussed, surgical consent, monitors and equipment checked, pre-op evaluation, timeout performed and anesthesia consent Lidocaine 1% used for infiltration and patient sedated Hand hygiene performed  and maximum sterile barriers used  Catheter size: 9 Fr MAC introducer Procedure performed using ultrasound guided technique. Ultrasound Notes:anatomy identified, needle tip was noted to be adjacent to the nerve/plexus identified, no ultrasound evidence of intravascular and/or intraneural injection and image(s) printed for medical record Attempts: 1 Following insertion, line sutured and dressing applied. Post procedure assessment: blood return through all ports, free fluid flow and no air  Patient tolerated the procedure well with no immediate complications.

## 2020-10-09 NOTE — Procedures (Signed)
Extubation Procedure Note  Patient Details:   Name: Tiffany Le DOB: 1975/10/27 MRN: [id number]   Airway Documentation:  Airway 7.5 mm (Active)  Secured at (cm) 24 cm 10/09/20 2000  Measured From Lips 10/09/20 1932  Secured Location Left 10/09/20 1932  Secured By Pink Tape 10/09/20 1932  Site Condition Dry 10/09/20 1932   Vent end date: 10/09/20 Vent end time: 2055   Patient extubated to 4L [place]. Minimal cuff leak present. MD Roxan Hockey ok to go ahead and extubate after solumedrol given. Patient got -20 on NIF, and VC of 450. Patient able to speak after being extubated. RT will continue to monitor as needed.  Evaluation  O2 sats: stable throughout Complications: No apparent complications Patient did tolerate procedure well. Bilateral Breath Sounds: Diminished   Yes  Kelle Darting 10/09/2020, 2055

## 2020-10-09 NOTE — Brief Op Note (Signed)
10/02/2020 - 10/09/2020  12:58 PM  PATIENT:  Tiffany Le  45 y.o. female  PRE-OPERATIVE DIAGNOSIS:  CORONARY ARTERY DISEASE  POST-OPERATIVE DIAGNOSIS:  CORONARY ARTERY DISEASE  PROCEDURE:  Procedure(s) with comments: CORONARY ARTERY BYPASS GRAFTING (CABG), ON PUMP, TIMES , USING LEFT INTERNAL MAMMARY ARTERY AND LEFT RADIAL ARTERY (OPEN HARVEST) (N/A) - POSSIBLE BIMA RADIAL ARTERY HARVEST (Left) TRANSESOPHAGEAL ECHOCARDIOGRAM (TEE) (N/A) INDOCYANINE GREEN FLUORESCENCE IMAGING (ICG) (N/A) CLIPPING OF ATRIAL APPENDAGE USING ATRICURE 35MM CLIP (N/A)   LIMA to LAD RIMA to PDA Radial to Distal OM1 and OM2  Radial harvest: 25 minutes  SURGEON:  Surgeon(s) and Role:    * [name], MD - Primary  PHYSICIAN ASSISTANT:  Nicholes Rough, PA-C   ANESTHESIA:   general  EBL:  Per anes record   BLOOD ADMINISTERED:none  DRAINS: routine   LOCAL MEDICATIONS USED:  NONE  SPECIMEN:  No Specimen  DISPOSITION OF SPECIMEN:  N/A  COUNTS:  YES  TOURNIQUET:  * No tourniquets in log *  DICTATION: .Dragon Dictation  PLAN OF CARE: Admit to inpatient   PATIENT DISPOSITION:  ICU - intubated and hemodynamically stable.   Delay start of Pharmacological VTE agent (>24hrs) due to surgical blood loss or risk of bleeding: yes

## 2020-10-09 NOTE — Anesthesia Procedure Notes (Signed)
Central Venous Catheter Insertion Performed by: Albertha Ghee, MD, anesthesiologist Start/End3/08/2020 7:14 AM, 10/09/2020 7:15 AM Patient location: Pre-op. Preanesthetic checklist: patient identified, IV checked, site marked, risks and benefits discussed, surgical consent, monitors and equipment checked, pre-op evaluation, timeout performed and anesthesia consent Hand hygiene performed  and maximum sterile barriers used  PA cath was placed.Swan type:thermodilution Procedure performed without using ultrasound guided technique. Attempts: 1 Patient tolerated the procedure well with no immediate complications.

## 2020-10-09 NOTE — Progress Notes (Signed)
RT did parameters with patient. Patient able to get -20 on NIF and 450 on VC. Patient had no cuff leak at this time. MD called and said to give solumedrol and then extubate. ABG normal at this time.

## 2020-10-09 NOTE — Progress Notes (Signed)
  Echocardiogram Echocardiogram Transesophageal has been performed.  Fidel Levy 10/09/2020, 8:37 AM

## 2020-10-09 NOTE — Anesthesia Procedure Notes (Addendum)
Procedure Name: Intubation Date/Time: 10/09/2020 7:46 AM Performed by: Cleda Daub, CRNA Pre-anesthesia Checklist: Patient identified, Emergency Drugs available, Suction available and Patient being monitored Patient Re-evaluated:Patient Re-evaluated prior to induction Oxygen Delivery Method: Circle system utilized Preoxygenation: Pre-oxygenation with 100% oxygen Induction Type: IV induction Ventilation: Mask ventilation without difficulty Laryngoscope Size: Glidescope Grade View: Grade I Tube type: Oral Tube size: 7.5 mm Number of attempts: 1 Airway Equipment and Method: Stylet and Oral airway Placement Confirmation: ETT inserted through vocal cords under direct vision,  positive ETCO2 and breath sounds checked- equal and bilateral (placed by Marguarite Arbour Rickers, SRNA) Secured at: 23 cm Tube secured with: Tape Dental Injury: Teeth and Oropharynx as per pre-operative assessment  Comments: Performed by Sueanne Margarita, SRNA

## 2020-10-09 NOTE — Anesthesia Procedure Notes (Signed)
Arterial Line Insertion Start/End3/08/2020 7:00 AM, 10/09/2020 7:10 AM Performed by: Cleda Daub, CRNA, CRNA  Patient location: Pre-op. Preanesthetic checklist: patient identified, IV checked, site marked, risks and benefits discussed, surgical consent, monitors and equipment checked, pre-op evaluation, timeout performed and anesthesia consent Lidocaine 1% used for infiltration Right, radial was placed Catheter size: 20 Fr Hand hygiene performed  and maximum sterile barriers used   Attempts: 1 Procedure performed without using ultrasound guided technique. Following insertion, dressing applied. Post procedure assessment: normal and unchanged  Patient tolerated the procedure well with no immediate complications.

## 2020-10-09 NOTE — H&P (Signed)
History and Physical Interval Note:  10/09/2020 7:10 AM  Tiffany Le  has presented today for surgery, with the diagnosis of CAD.  The various methods of treatment have been discussed with the patient and family. After consideration of risks, benefits and other options for treatment, the patient has consented to  Procedure(s) with comments: CORONARY ARTERY BYPASS GRAFTING (CABG) (N/A) - POSSIBLE BIMA possible RADIAL ARTERY HARVEST (Left) TRANSESOPHAGEAL ECHOCARDIOGRAM (TEE) (N/A) INDOCYANINE GREEN FLUORESCENCE IMAGING (ICG) (N/A) as a surgical intervention.  The patient's history has been reviewed, patient examined, no change in status, stable for surgery.  I have reviewed the patient's chart and labs.  Questions were answered to the patient's satisfaction.     Tiffany Le

## 2020-10-10 ENCOUNTER — Encounter (HOSPITAL_COMMUNITY): Payer: Self-pay | Admitting: Cardiothoracic Surgery

## 2020-10-10 ENCOUNTER — Inpatient Hospital Stay (HOSPITAL_COMMUNITY): Payer: Medicaid Other

## 2020-10-10 LAB — CBC
HCT: 31.4 % — ABNORMAL LOW (ref 36.0–46.0)
HCT: 29.5 % — ABNORMAL LOW (ref 36.0–46.0)
Hemoglobin: 10 g/dL — ABNORMAL LOW (ref 12.0–15.0)
Hemoglobin: 9.3 g/dL — ABNORMAL LOW (ref 12.0–15.0)
MCH: 27 pg (ref 26.0–34.0)
MCH: 27.2 pg (ref 26.0–34.0)
MCHC: 31.8 g/dL (ref 30.0–36.0)
MCHC: 31.5 g/dL (ref 30.0–36.0)
MCV: 84.9 fL (ref 80.0–100.0)
MCV: 86.3 fL (ref 80.0–100.0)
Platelets: 317 10*3/uL (ref 150–400)
Platelets: 288 10*3/uL (ref 150–400)
RBC: 3.7 MIL/uL — ABNORMAL LOW (ref 3.87–5.11)
RBC: 3.42 MIL/uL — ABNORMAL LOW (ref 3.87–5.11)
RDW: 14.6 % (ref 11.5–15.5)
RDW: 14.6 % (ref 11.5–15.5)
WBC: 22 10*3/uL — ABNORMAL HIGH (ref 4.0–10.5)
WBC: 24.1 10*3/uL — ABNORMAL HIGH (ref 4.0–10.5)
nRBC: 0 % (ref 0.0–0.2)
nRBC: 0 % (ref 0.0–0.2)

## 2020-10-10 LAB — COOXEMETRY PANEL
Carboxyhemoglobin: 0.9 % (ref 0.5–1.5)
Methemoglobin: 1 % (ref 0.0–1.5)
O2 Saturation: 70.3 %
Total hemoglobin: 13 g/dL (ref 12.0–16.0)

## 2020-10-10 LAB — BASIC METABOLIC PANEL
Anion gap: 11 (ref 5–15)
Anion gap: 11 (ref 5–15)
BUN: 26 mg/dL — ABNORMAL HIGH (ref 6–20)
BUN: 32 mg/dL — ABNORMAL HIGH (ref 6–20)
CO2: 23 mmol/L (ref 22–32)
CO2: 22 mmol/L (ref 22–32)
Calcium: 8.2 mg/dL — ABNORMAL LOW (ref 8.9–10.3)
Calcium: 8.4 mg/dL — ABNORMAL LOW (ref 8.9–10.3)
Chloride: 101 mmol/L (ref 98–111)
Chloride: 99 mmol/L (ref 98–111)
Creatinine, Ser: 1.05 mg/dL — ABNORMAL HIGH (ref 0.44–1.00)
Creatinine, Ser: 1.49 mg/dL — ABNORMAL HIGH (ref 0.44–1.00)
GFR, Estimated: >60 mL/min (ref >60)
GFR, Estimated: 44 mL/min — ABNORMAL LOW (ref >60)
Glucose, Bld: 228 mg/dL — ABNORMAL HIGH (ref 70–99)
Glucose, Bld: 262 mg/dL — ABNORMAL HIGH (ref 70–99)
Potassium: 3.9 mmol/L (ref 3.5–5.1)
Potassium: 4.2 mmol/L (ref 3.5–5.1)
Sodium: 135 mmol/L (ref 135–145)
Sodium: 132 mmol/L — ABNORMAL LOW (ref 135–145)

## 2020-10-10 LAB — MAGNESIUM
Magnesium: 3.2 mg/dL — ABNORMAL HIGH (ref 1.7–2.4)
Magnesium: 3 mg/dL — ABNORMAL HIGH (ref 1.7–2.4)

## 2020-10-10 LAB — GLUCOSE, CAPILLARY
Glucose-Capillary: 122 mg/dL — ABNORMAL HIGH (ref 70–99)
Glucose-Capillary: 150 mg/dL — ABNORMAL HIGH (ref 70–99)
Glucose-Capillary: 169 mg/dL — ABNORMAL HIGH (ref 70–99)
Glucose-Capillary: 182 mg/dL — ABNORMAL HIGH (ref 70–99)
Glucose-Capillary: 205 mg/dL — ABNORMAL HIGH (ref 70–99)
Glucose-Capillary: 206 mg/dL — ABNORMAL HIGH (ref 70–99)
Glucose-Capillary: 210 mg/dL — ABNORMAL HIGH (ref 70–99)
Glucose-Capillary: 215 mg/dL — ABNORMAL HIGH (ref 70–99)
Glucose-Capillary: 216 mg/dL — ABNORMAL HIGH (ref 70–99)
Glucose-Capillary: 218 mg/dL — ABNORMAL HIGH (ref 70–99)
Glucose-Capillary: 226 mg/dL — ABNORMAL HIGH (ref 70–99)
Glucose-Capillary: 226 mg/dL — ABNORMAL HIGH (ref 70–99)
Glucose-Capillary: 234 mg/dL — ABNORMAL HIGH (ref 70–99)
Glucose-Capillary: 235 mg/dL — ABNORMAL HIGH (ref 70–99)
Glucose-Capillary: 299 mg/dL — ABNORMAL HIGH (ref 70–99)

## 2020-10-10 LAB — ECHO INTRAOPERATIVE TEE: Weight: 3947.12 oz

## 2020-10-10 MED ORDER — SODIUM CHLORIDE 0.9% FLUSH: 10.0000 mL | INTRAVENOUS | Status: DC | PRN

## 2020-10-10 MED ORDER — AMIODARONE LOAD VIA INFUSION
150.0000 mg | Freq: Once | INTRAVENOUS | Status: AC
  Administered 2020-10-10: 150 mg via INTRAVENOUS
  Filled 2020-10-10: qty 83.34

## 2020-10-10 MED ORDER — POTASSIUM CHLORIDE 20 MEQ PO PACK
20.0000 meq | PACK | Freq: Once | ORAL | Status: AC
  Administered 2020-10-10: 20 meq via ORAL
  Filled 2020-10-10: qty 1

## 2020-10-10 MED ORDER — THIAMINE HCL 100 MG/ML IJ SOLN
Freq: Once | INTRAVENOUS | Status: AC
  Filled 2020-10-10: qty 1000

## 2020-10-10 MED ORDER — INSULIN DETEMIR 100 UNIT/ML ~~LOC~~ SOLN: 15.0000 [IU] | Freq: Two times a day (BID) | SUBCUTANEOUS | Status: DC

## 2020-10-10 MED ORDER — ISOSORBIDE DINITRATE 10 MG PO TABS
10.0000 mg | ORAL_TABLET | Freq: Three times a day (TID) | ORAL | Status: DC
  Administered 2020-10-10 – 2020-10-12 (×7): 10 mg via ORAL
  Filled 2020-10-10 (×7): qty 1

## 2020-10-10 MED ORDER — AMIODARONE HCL 200 MG PO TABS
200.0000 mg | ORAL_TABLET | Freq: Every day | ORAL | Status: DC
  Administered 2020-10-10: 200 mg via ORAL
  Filled 2020-10-10: qty 1

## 2020-10-10 MED ORDER — FUROSEMIDE 10 MG/ML IJ SOLN
40.0000 mg | Freq: Once | INTRAMUSCULAR | Status: AC
  Administered 2020-10-10: 40 mg via INTRAVENOUS
  Filled 2020-10-10: qty 4

## 2020-10-10 MED ORDER — SODIUM CHLORIDE 0.9% FLUSH
10.0000 mL | Freq: Two times a day (BID) | INTRAVENOUS | Status: DC
  Administered 2020-10-10 – 2020-10-11 (×3): 10 mL

## 2020-10-10 MED ORDER — POTASSIUM CHLORIDE 20 MEQ PO PACK: 20.0000 meq | PACK | Freq: Once | ORAL | Status: DC

## 2020-10-10 MED ORDER — CLOPIDOGREL BISULFATE 75 MG PO TABS
75.0000 mg | ORAL_TABLET | Freq: Every day | ORAL | Status: DC
  Administered 2020-10-10 – 2020-10-27 (×18): 75 mg via ORAL
  Filled 2020-10-10 (×18): qty 1

## 2020-10-10 MED ORDER — KETOROLAC TROMETHAMINE 15 MG/ML IJ SOLN
7.5000 mg | Freq: Four times a day (QID) | INTRAMUSCULAR | Status: AC
  Administered 2020-10-10 – 2020-10-11 (×4): 7.5 mg via INTRAVENOUS
  Filled 2020-10-10 (×4): qty 1

## 2020-10-10 MED ORDER — INSULIN DETEMIR 100 UNIT/ML ~~LOC~~ SOLN
15.0000 [IU] | Freq: Two times a day (BID) | SUBCUTANEOUS | Status: DC
  Administered 2020-10-10 – 2020-10-11 (×4): 15 [IU] via SUBCUTANEOUS
  Filled 2020-10-10 (×6): qty 0.15

## 2020-10-10 MED ORDER — AMIODARONE HCL IN DEXTROSE 360-4.14 MG/200ML-% IV SOLN
60.0000 mg/h | INTRAVENOUS | Status: AC
  Administered 2020-10-10 (×2): 60 mg/h via INTRAVENOUS
  Filled 2020-10-10 (×2): qty 200

## 2020-10-10 MED ORDER — AMIODARONE HCL IN DEXTROSE 360-4.14 MG/200ML-% IV SOLN
30.0000 mg/h | INTRAVENOUS | Status: DC
  Administered 2020-10-11 – 2020-10-12 (×3): 30 mg/h via INTRAVENOUS
  Filled 2020-10-10 (×3): qty 200

## 2020-10-10 MED ORDER — INSULIN ASPART 100 UNIT/ML ~~LOC~~ SOLN
0.0000 [IU] | SUBCUTANEOUS | Status: DC
  Administered 2020-10-10: 12 [IU] via SUBCUTANEOUS
  Administered 2020-10-10 – 2020-10-11 (×2): 8 [IU] via SUBCUTANEOUS
  Administered 2020-10-11: 4 [IU] via SUBCUTANEOUS

## 2020-10-10 MED ORDER — LEVALBUTEROL HCL 0.63 MG/3ML IN NEBU
0.6300 mg | INHALATION_SOLUTION | Freq: Four times a day (QID) | RESPIRATORY_TRACT | Status: AC
  Administered 2020-10-10 – 2020-10-12 (×6): 0.63 mg via RESPIRATORY_TRACT
  Filled 2020-10-10 (×6): qty 3

## 2020-10-10 MED ORDER — LEVALBUTEROL TARTRATE 45 MCG/ACT IN AERO: 2.0000 | INHALATION_SPRAY | Freq: Four times a day (QID) | RESPIRATORY_TRACT | Status: DC

## 2020-10-10 MED ORDER — COLCHICINE 0.3 MG HALF TABLET
0.3000 mg | ORAL_TABLET | Freq: Two times a day (BID) | ORAL | Status: DC
  Administered 2020-10-10 – 2020-10-18 (×17): 0.3 mg via ORAL
  Filled 2020-10-10 (×17): qty 1

## 2020-10-10 MED ORDER — ASPIRIN EC 81 MG PO TBEC
81.0000 mg | DELAYED_RELEASE_TABLET | Freq: Every day | ORAL | Status: DC
  Administered 2020-10-10 – 2020-10-20 (×11): 81 mg via ORAL
  Filled 2020-10-10 (×11): qty 1

## 2020-10-10 MED ORDER — LEVALBUTEROL HCL 0.63 MG/3ML IN NEBU: 0.6300 mg | INHALATION_SOLUTION | Freq: Four times a day (QID) | RESPIRATORY_TRACT | Status: DC

## 2020-10-10 MED FILL — Heparin Sodium (Porcine) Inj 1000 Unit/ML: INTRAMUSCULAR | Qty: 20 | Status: AC

## 2020-10-10 MED FILL — Calcium Chloride Inj 10%: INTRAVENOUS | Qty: 10 | Status: AC

## 2020-10-10 MED FILL — Potassium Chloride Inj 2 mEq/ML: INTRAVENOUS | Qty: 40 | Status: AC

## 2020-10-10 MED FILL — Sodium Chloride IV Soln 0.9%: INTRAVENOUS | Qty: 4000 | Status: AC

## 2020-10-10 MED FILL — Heparin Sodium (Porcine) Inj 1000 Unit/ML: INTRAMUSCULAR | Qty: 30 | Status: AC

## 2020-10-10 MED FILL — Electrolyte-R (PH 7.4) Solution: INTRAVENOUS | Qty: 3000 | Status: AC

## 2020-10-10 MED FILL — Magnesium Sulfate Inj 50%: INTRAMUSCULAR | Qty: 10 | Status: AC

## 2020-10-10 MED FILL — Sodium Bicarbonate IV Soln 8.4%: INTRAVENOUS | Qty: 50 | Status: AC

## 2020-10-10 NOTE — Progress Notes (Signed)
EVENING ROUNDS NOTE :     [address]       [place] [address]             [phone]                 1 Day Post-Op Procedure(s) (LRB): CORONARY ARTERY BYPASS GRAFTING (CABG), ON PUMP, TIMES FOUR, USING LEFT INTERNAL MAMMARY ARTERY AND LEFT RADIAL ARTERY (OPEN HARVEST) (N/A) RADIAL ARTERY HARVEST (Left) TRANSESOPHAGEAL ECHOCARDIOGRAM (TEE) (N/A) INDOCYANINE GREEN FLUORESCENCE IMAGING (ICG) (N/A) CLIPPING OF ATRIAL APPENDAGE USING ATRICURE 35MM CLIP (N/A)   Total Length of Stay:  LOS: 8 days  Events:   No events  resting comfortably    BP 129/74    Pulse 79    Temp (!) 97 F (36.1 C)    Resp 15    Wt 115.8 kg    LMP  (LMP Unknown)    SpO2 98%    BMI 43.82 kg/m   PAP: (26-39)/(12-23) 26/13 CVP:  [14 mmHg] 14 mmHg CO:  [5.1 L/min-6.5 L/min] 5.7 L/min CI:  [2.4 L/min/m2-3 L/min/m2] 2.7 L/min/m2  FiO2 (%):  [40 %] 40 %   sodium chloride     sodium chloride     sodium chloride 20 mL/hr at 10/09/20 1510   amiodarone 60 mg/hr (10/10/20 1800)   Followed by   amiodarone     epinephrine 4 mcg/min (10/10/20 1800)   lactated ringers     lactated ringers     lactated ringers 20 mL/hr at 10/10/20 1800   milrinone 0.25 mcg/kg/min (10/10/20 1800)   nitroGLYCERIN 5 mcg/min (10/10/20 1800)   phenylephrine (NEO-SYNEPHRINE) Adult infusion 20 mcg/min (10/10/20 1800)    I/O last 3 completed shifts: In: 6060.2 [P.O.:120; I.V.:4282.7; Blood:257; IV Piggyback:1400.5] Out: 2763 [Urine:1140; Blood:643; Chest Tube:980]   CBC Latest Ref Rng & Units 10/10/2020 10/10/2020 10/09/2020  WBC 4.0 - 10.5 K/uL 24.1(H) 22.0(H) -  Hemoglobin 12.0 - 15.0 g/dL 9.3(L) 10.0(L) 10.2(L)  Hematocrit 36.0 - 46.0 % 29.5(L) 31.4(L) 30.0(L)  Platelets 150 - 400 K/uL 288 317 -    BMP Latest Ref Rng & Units 10/10/2020 10/10/2020 10/09/2020  Glucose 70 - 99 mg/dL 262(H) 228(H) -  BUN 6 - 20 mg/dL 32(H) 26(H) -  Creatinine 0.44 - 1.00 mg/dL 1.49(H) 1.05(H) -  BUN/Creat Ratio 9 - 23 - - -   Sodium 135 - 145 mmol/L 132(L) 135 136  Potassium 3.5 - 5.1 mmol/L 4.2 3.9 3.9  Chloride 98 - 111 mmol/L 99 101 -  CO2 22 - 32 mmol/L 22 23 -  Calcium 8.9 - 10.3 mg/dL 8.4(L) 8.2(L) -    ABG    Component Value Date/Time   PHART 7.364 10/09/2020 2158   PCO2ART 41.9 10/09/2020 2158   PO2ART 128 (H) 10/09/2020 2158   HCO3 23.9 10/09/2020 2158   TCO2 25 10/09/2020 2158   ACIDBASEDEF 1.0 10/09/2020 2158   O2SAT 70.3 10/10/2020 6811       Melodie Bouillon, MD 10/10/2020 7:34 PM

## 2020-10-10 NOTE — Anesthesia Postprocedure Evaluation (Signed)
Anesthesia Post Note  Patient: Tiffany Le  Procedure(s) Performed: CORONARY ARTERY BYPASS GRAFTING (CABG), ON PUMP, TIMES FOUR, USING LEFT INTERNAL MAMMARY ARTERY AND LEFT RADIAL ARTERY (OPEN HARVEST) (N/A Chest) RADIAL ARTERY HARVEST (Left Arm Lower) TRANSESOPHAGEAL ECHOCARDIOGRAM (TEE) (N/A ) INDOCYANINE GREEN FLUORESCENCE IMAGING (ICG) (N/A ) CLIPPING OF ATRIAL APPENDAGE USING ATRICURE 35MM CLIP (N/A Chest)     Patient location during evaluation: SICU Anesthesia Type: General Level of consciousness: sedated Pain management: pain level controlled Vital Signs Assessment: post-procedure vital signs reviewed and stable Respiratory status: patient remains intubated per anesthesia plan Cardiovascular status: stable Postop Assessment: no apparent nausea or vomiting Anesthetic complications: no   No complications documented.  Last Vitals:  Vitals:   10/10/20 1830 10/10/20 1941  BP:  125/67  Pulse: 79 80  Resp: 15 (!) 22  Temp:    SpO2: 98%     Last Pain:  Vitals:   10/10/20 1815  TempSrc:   PainSc: 2                  Jhayden Demuro S

## 2020-10-10 NOTE — Discharge Instructions (Signed)

## 2020-10-10 NOTE — Progress Notes (Signed)
Inpatient Diabetes Program Recommendations  AACE/ADA: New Consensus Statement on Inpatient Glycemic Control (2015)  Target Ranges:  Prepandial:   less than 140 mg/dL      Peak postprandial:   less than 180 mg/dL (1-2 hours)      Critically ill patients:  140 - 180 mg/dL   Lab Results  Component Value Date   GLUCAP 150 (H) 10/10/2020   HGBA1C 7.5 (H) 10/09/2020    Review of Glycemic Control Results for Tiffany Le, Tiffany Le (MRN [id number]) as of 10/10/2020 11:44  Ref. Range 10/10/2020 06:33 10/10/2020 08:09 10/10/2020 09:37 10/10/2020 10:50  Glucose-Capillary Latest Ref Range: 70 - 99 mg/dL 206 (H) 182 (H) 122 (H) 150 (H)   Diabetes history: Type 2 DM Outpatient Diabetes medications: Lantus 35 units QD, Glipizide 10 mg BID Current orders for Inpatient glycemic control: IV insulin   Inpatient Diabetes Program Recommendations:    When ready to transition consider: -Levemir 15 units two hours prior to discontinuation, then BID to follow -Novolog 0-24 units Q4H  Thanks, Bronson Curb, MSN, RNC-OB Diabetes Coordinator [phone] (8a-5p)

## 2020-10-10 NOTE — Progress Notes (Signed)
1 Day Post-Op Procedure(s) (LRB): CORONARY ARTERY BYPASS GRAFTING (CABG), ON PUMP, TIMES FOUR, USING LEFT INTERNAL MAMMARY ARTERY AND LEFT RADIAL ARTERY (OPEN HARVEST) (N/A) RADIAL ARTERY HARVEST (Left) TRANSESOPHAGEAL ECHOCARDIOGRAM (TEE) (N/A) INDOCYANINE GREEN FLUORESCENCE IMAGING (ICG) (N/A) CLIPPING OF ATRIAL APPENDAGE USING ATRICURE 35MM CLIP (N/A) Subjective: thirsty  Objective: Vital signs in last 24 hours: Temp:  [97.34 F (36.3 C)-99.86 F (37.7 C)] 99.5 F (37.5 C) (03/02 0600) Pulse Rate:  [80-106] 106 (03/02 0600) Cardiac Rhythm: Normal sinus rhythm (03/02 0600) Resp:  [9-43] 28 (03/02 0500) BP: (111-137)/(55-74) 120/60 (03/02 0500) SpO2:  [88 %-100 %] 97 % (03/02 0600) Arterial Line BP: (101-131)/(49-62) 131/62 (03/02 0600) FiO2 (%):  [40 %-50 %] 40 % (03/01 2000) Weight:  [115.8 kg] 115.8 kg (03/02 0618)  Hemodynamic parameters for last 24 hours: PAP: (29-39)/(15-23) 38/21 CO:  [3.4 L/min-6.5 L/min] 5.7 L/min CI:  [1.7 L/min/m2-3 L/min/m2] 2.7 L/min/m2  Intake/Output from previous day: 03/01 0701 - 03/02 0700 In: 5099 [I.V.:3591.6; Blood:257; IV Piggyback:1250.5] Out: 2398 [Urine:1015; Blood:643; Chest Tube:740] Intake/Output this shift: No intake/output data recorded.  General appearance: alert and cooperative Neurologic: intact Heart: regular rate and rhythm, S1, S2 normal, no murmur, click, rub or gallop Lungs: clear to auscultation bilaterally Abdomen: soft, non-tender; bowel sounds normal; no masses,  no organomegaly Extremities: extremities normal, atraumatic, no cyanosis or edema Wound: dressed, dry  Lab Results: Recent Labs    10/09/20 2100 10/09/20 2158 10/10/20 0424  WBC 19.3*  --  22.0*  HGB 9.9* 10.2* 10.0*  HCT 29.7* 30.0* 31.4*  PLT 312  --  317   BMET:  Recent Labs    10/09/20 2100 10/09/20 2158 10/10/20 0424  NA 134* 136 135  K 4.1 3.9 3.9  CL 101  --  101  CO2 23  --  23  GLUCOSE 199*  --  228*  BUN 24*  --  26*   CREATININE 0.90  --  1.05*  CALCIUM 7.8*  --  8.2*    PT/INR:  Recent Labs    10/09/20 1443  LABPROT 16.3*  INR 1.4*   ABG    Component Value Date/Time   PHART 7.364 10/09/2020 2158   HCO3 23.9 10/09/2020 2158   TCO2 25 10/09/2020 2158   ACIDBASEDEF 1.0 10/09/2020 2158   O2SAT 70.3 10/10/2020 0652   CBG (last 3)  Recent Labs    10/09/20 2302 10/10/20 0015 10/10/20 0116  GLUCAP 205* 216* 218*    Assessment/Plan: S/P Procedure(s) (LRB): CORONARY ARTERY BYPASS GRAFTING (CABG), ON PUMP, TIMES FOUR, USING LEFT INTERNAL MAMMARY ARTERY AND LEFT RADIAL ARTERY (OPEN HARVEST) (N/A) RADIAL ARTERY HARVEST (Left) TRANSESOPHAGEAL ECHOCARDIOGRAM (TEE) (N/A) INDOCYANINE GREEN FLUORESCENCE IMAGING (ICG) (N/A) CLIPPING OF ATRIAL APPENDAGE USING ATRICURE 35MM CLIP (N/A) Mobilize gentle resuscitation   LOS: 8 days    Tiffany Le 10/10/2020
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
## 2020-10-10 NOTE — Op Note (Signed)
CARDIOTHORACIC SURGERY OPERATIVE NOTE  Date of Procedure: 10/10/2020  Preoperative Diagnosis: Severe 3-vessel Coronary Artery Disease with depressed LV function  Postoperative Diagnosis: Same with intraoperative atrial flutter  Procedure:    Coronary Artery Bypass Grafting x 4  Left Internal Mammary Artery to Distal Left Anterior Descending Coronary Artery; pedicled right internal mammary artery graft to Posterior Descending Coronary Artery; left radial artery  Graft to first and second obtuse Marginal Branches of Left Circumflex Coronary Artery Open left radial artery harvesting Bilateral internal mammary artery harvesting Completion graft surveillance with indocyanine green fluorescence imaging Left atrial appendage clipping Rigid sternal reconstruction with linear plating system  Surgeon: B.  Murvin Natal, MD  Assistant: T.  Harriet Pho, PA-C  Anesthesia: General  Operative Findings:  Reduced left ventricular systolic function EF 94%  Good quality internal mammary artery conduits  Good quality radial artery conduit  Good quality target vessels for grafting    BRIEF CLINICAL NOTE AND INDICATIONS FOR SURGERY  45 year old lady with strong family history  for early coronary artery disease presented 2 weeks ago with heart failure symptoms.  She underwent left heart catheterization demonstrating severe multivessel coronary artery disease.  CABG was suggested.  However, she declined.  She was discharged and within a few days experienced severe respiratory distress.  She was taken to the emergency department and was intubated.  During the event she had aspiration.  She has now resolved her respiratory failure and has been optimized medically.  She is consented to undergo multivessel coronary artery bypass grafting.   DETAILS OF THE OPERATIVE PROCEDURE  Preparation:  The patient is brought to the operating room on the above mentioned date and central monitoring was established by the  anesthesia team including placement of Swan-Ganz catheter and radial arterial line. The patient is placed in the supine position on the operating table.  Intravenous antibiotics are administered. General endotracheal anesthesia is induced uneventfully. A Foley catheter is placed.  Baseline transesophageal echocardiogram was performed.  Findings were notable for reduced left ventricular function with a EF approximately 40%  The patient's chest, abdomen, left upper extremity, both groins, and both lower extremities are prepared and draped in a sterile manner. A time out procedure is performed.   Surgical Approach and Conduit Harvest:  Attention is first turned to the left forearm where an incision is made overlying a palpable left radial artery pulse.  Open left radial artery harvesting is performed.  When the graft is removed the incision is closed in layers and the arm is retucked at the side.  A median sternotomy incision was performed and the left internal mammary artery is dissected from the chest wall and prepared for bypass grafting. The left internal mammary artery is notably good quality conduit.  Attention is then turned to the right hemithorax with a right internal mammary artery and its pedicle mobilized in a standard fashion.  Prior to dividing the pedicle distally full dose heparin is given intravenously.  Following systemic heparinization, the internal mammary artery conduits were transected distally and noted to have excellent flow.  Both pedicles were treated with a solution of papaverine.   Extracorporeal Cardiopulmonary Bypass and Myocardial Protection:  The pericardium is opened. The ascending aorta is nondiseased in appearance. The ascending aorta and the right atrium are cannulated for cardiopulmonary bypass.  Adequate heparinization is verified.  The entire pre-bypass portion of the operation was notable for stable hemodynamics.  Cardiopulmonary bypass was begun and the surface  of the heart is inspected. Distal target  vessels are selected for coronary artery bypass grafting. A cardioplegia cannula is placed in the ascending aorta.  The patient is noted to have a rhythm consistent with atrial flutter.  This had not been previously recognized or documented. The patient is allowed to cool passively to 34C systemic temperature.  The aortic cross clamp is applied and cold blood cardioplegia is delivered initially in an antegrade fashion through the aortic root. Iced saline slush is applied for topical hypothermia.  The initial cardioplegic arrest is rapid with early diastolic arrest.  Repeat doses of cardioplegia are administered intermittently throughout the entire cross clamp portion of the operation through the aortic root and through subsequently placed grafts in order to maintain completely flat electrocardiogram.   Coronary Artery Bypass Grafting:   The  posterior descending branch of the right coronary artery was grafted using the pedicled right internal mammary artery graft in an end-to-side fashion.  At the site of distal anastomosis the target vessel was good quality and measured approximately 1.5 mm in diameter.Anastomotic patency and runoff was confirmed with indocyanine green fluorescence imaging (SPY).  The first and second obtuse marginal branches of the left circumflex coronary artery was grafted using the left radial artery graft in a sequenced fashion.  At the site of distal anastomosis the target vessel were good quality and measured approximately 1.5 mm in diameter.    The distal left anterior coronary artery was grafted with the left internal mammary artery in an end-to-side fashion.  At the site of distal anastomosis the target vessel was good quality and measured approximately 1.5 mm in diameter. Anastomotic patency and runoff was confirmed with indocyanine green fluorescence imaging (SPY).  The proximal to artery graft anastomosis was placed directly to  the ascending aorta prior to removal of the aortic cross clamp.  A left atrial appendage clip was placed due to the presence of intraoperative atrial dysrhythmias.  A reanimation dose of cardioplegia was given down the aortic root.  De-airing procedures were performed and the aortic cross-clamp was removed.   Procedure Completion:  All proximal and distal coronary anastomoses were inspected for hemostasis and appropriate graft orientation. Epicardial pacing wires are fixed to the right ventricular outflow tract and to the right atrial appendage. The patient is rewarmed to 37C temperature. The patient is weaned and disconnected from cardiopulmonary bypass.  The patient's rhythm at separation from bypass was sinus bradycardia.  The patient was weaned from cardiopulmonary bypass with moderate inotropic support.   Followup transesophageal echocardiogram performed after separation from bypass revealed  no changes from the preoperative exam.  The aortic and venous cannula were removed uneventfully. Protamine was administered to reverse the anticoagulation. The mediastinum and pleural space were inspected for hemostasis and irrigated with saline solution. The mediastinum and both pleural spaces were drained using fluted chest tubes placed through separate stab incisions inferiorly.  The soft tissues anterior to the aorta were reapproximated loosely. The sternum is closed in a rigid fashion with linear plating system and with double strength sternal wire. The soft tissues anterior to the sternum were closed in multiple layers and the skin is closed with a running subcuticular skin closure.  The post-bypass portion of the operation was notable for stable rhythm and hemodynamics.    Disposition:  The patient tolerated the procedure well and is transported to the surgical intensive care in stable condition. There are no intraoperative complications. All sponge instrument and needle counts are verified correct  at completion of the operation.  Jayme Cloud, MD 10/10/2020 3:17 PM

## 2020-10-10 NOTE — Progress Notes (Addendum)
Patient ID: Tiffany Le, female   DOB: Oct 16, 1975, 45 y.o.   MRN: [id number]     Advanced Heart Failure Rounding Note  PCP-Cardiologist: Quay Burow, MD   Subjective:   10/09/20 S/P CABG x4  Left Radial Harvest  Remains on EPi 2, milrinone 0.2, Neo 20 mcg, nitro 5 mcg. CO-OX 70%   Swan # CVP 13 PA 35/18 CO 5.7 CI 2.7   Able to stand up this morning. Complaining of left arm pain.    Objective:   Weight Range: 115.8 kg Body mass index is 43.82 kg/m.   Vital Signs:   Temp:  [97.34 F (36.3 C)-99.86 F (37.7 C)] 99.5 F (37.5 C) (03/02 0600) Pulse Rate:  [80-106] 106 (03/02 0600) Resp:  [9-43] 28 (03/02 0500) BP: (111-137)/(55-74) 120/60 (03/02 0500) SpO2:  [88 %-100 %] 97 % (03/02 0600) Arterial Line BP: (101-131)/(49-62) 131/62 (03/02 0600) FiO2 (%):  [40 %-50 %] 40 % (03/01 2000) Weight:  [115.8 kg] 115.8 kg (03/02 0618) Last BM Date: 10/03/20  Weight change: Filed Weights   10/08/20 0500 10/09/20 0210 10/10/20 0618  Weight: 114.5 kg 111.9 kg 115.8 kg    Intake/Output:   Intake/Output Summary (Last 24 hours) at 10/10/2020 0820 Last data filed at 10/10/2020 0700 Gross per 24 hour  Intake 4699.03 ml  Output 2398 ml  Net 2301.03 ml      Physical Exam   General:  In bed. No distress HEENT: normal Neck: supple. Difficult to assess JVP. Carotids 2+ bilat; no bruits. No lymphadenopathy or thryomegaly appreciated. RIJ swan  Cor: PMI nondisplaced. Regular rate & rhythm. No rubs, gallops or murmurs. Incisional VAC in place  Lungs: Decreased in the bases on 4 liters [place] Abdomen: soft, nontender, nondistended. No hepatosplenomegaly. No bruits or masses. Good bowel sounds. Extremities: no cyanosis, clubbing, rash, edema. LUE dressing. RUE PICC   Neuro: alert & orientedx3, cranial nerves grossly intact. moves all 4 extremities w/o difficulty. Affect pleasant   Telemetry   SR-ST 90-100s   Labs    CBC Recent Labs    10/09/20 2100 10/09/20 2158  10/10/20 0424  WBC 19.3*  --  22.0*  HGB 9.9* 10.2* 10.0*  HCT 29.7* 30.0* 31.4*  MCV 83.9  --  84.9  PLT 312  --  237   Basic Metabolic Panel Recent Labs    10/09/20 2100 10/09/20 2158 10/10/20 0424  NA 134* 136 135  K 4.1 3.9 3.9  CL 101  --  101  CO2 23  --  23  GLUCOSE 199*  --  228*  BUN 24*  --  26*  CREATININE 0.90  --  1.05*  CALCIUM 7.8*  --  8.2*  MG 3.2*  --  3.2*   Liver Function Tests Recent Labs    10/09/20 0410  AST 26  ALT 31  ALKPHOS 52  BILITOT 0.8  PROT 6.5  ALBUMIN 2.2*   No results for input(s): LIPASE, AMYLASE in the last 72 hours. Cardiac Enzymes No results for input(s): CKTOTAL, CKMB, CKMBINDEX, TROPONINI in the last 72 hours.  BNP: BNP (last 3 results) Recent Labs    10/02/20 0516  BNP 947.1*    ProBNP (last 3 results) No results for input(s): PROBNP in the last 8760 hours.   D-Dimer No results for input(s): DDIMER in the last 72 hours. Hemoglobin A1C Recent Labs    10/09/20 0410  HGBA1C 7.5*   Fasting Lipid Panel No results for input(s): CHOL, HDL, LDLCALC, TRIG, CHOLHDL, LDLDIRECT in  the last 72 hours. Thyroid Function Tests No results for input(s): TSH, T4TOTAL, T3FREE, THYROIDAB in the last 72 hours.  Invalid input(s): FREET3  Other results:   Imaging    DG Chest Port 1 View  Result Date: 10/10/2020 CLINICAL DATA:  Chest tube.  Open-heart surgery. EXAM: PORTABLE CHEST 1 VIEW COMPARISON:  10/09/2020. FINDINGS: Interim extubation and removal of NG tube. Swan-Ganz catheter, mediastinal drainage catheters, right PICC line, bilateral chest tubes in stable position. No pneumothorax noted on today's exam. Prior CABG. Left atrial appendage clip in stable position. Stable cardiomegaly. Bilateral pulmonary infiltrates/edema noted on today's exam. Small bilateral pleural effusions. Findings suggest CHF. IMPRESSION: 1. Interim extubation and removal of NG tube. Remaining lines and tubes including bilateral chest tubes in stable  position. No pneumothorax noted on today's exam. 2. Prior CABG. Stable cardiomegaly. 3. Bilateral pulmonary infiltrates/edema noted on today's exam. Electronically Signed   By: Marcello Moores  Register   On: 10/10/2020 07:15   DG Chest Port 1 View  Result Date: 10/09/2020 CLINICAL DATA:  Postop day 0 status post CABG EXAM: PORTABLE CHEST 1 VIEW COMPARISON:  10/08/2020 FINDINGS: Endotracheal tube tip 0.6 cm above the carina, consider retraction of 1.5 cm. Right IJ Swan-Ganz catheter tip is in the right pulmonary artery. Mediastinal drain and bilateral chest tubes noted. Sternotomy wires are in place. Bandlike density over the cardiac shadow, probably an atrial appendage clip. Nasogastric tube enters the stomach. Less than 5% left apical pneumothorax. Mild atelectasis at the left lung base with some obscuration of the left hemidiaphragm. Mildly low lung volumes. Mild enlargement of the cardiopericardial silhouette, without overt edema. IMPRESSION: 1. Less than 5% left apical pneumothorax. Left chest tube is all ready in place as part of the support apparatus. 2. Endotracheal tube tip 0.6 cm above the carina, consider retraction of 1.5 cm. 3. Mild atelectasis at the left lung base. Electronically Signed   By: Van Clines M.D.   On: 10/09/2020 15:19     Medications:     Scheduled Medications: . acetaminophen  1,000 mg Oral Q6H   Or  . acetaminophen (TYLENOL) oral liquid 160 mg/5 mL  1,000 mg Per Tube Q6H  . aspirin EC  81 mg Oral Daily  . atorvastatin  80 mg Oral Daily  . bisacodyl  10 mg Oral Daily   Or  . bisacodyl  10 mg Rectal Daily  . Chlorhexidine Gluconate Cloth  6 each Topical Daily  . clopidogrel  75 mg Oral Daily  . colchicine  0.3 mg Oral BID  . docusate sodium  200 mg Oral Daily  . isosorbide dinitrate  10 mg Oral TID  . ketorolac  7.5 mg Intravenous Q6H  . levalbuterol  2 puff Inhalation Q6H  . [START ON 10/11/2020] pantoprazole  40 mg Oral Daily  . sodium chloride flush  3 mL  Intravenous Q12H    Infusions: . sodium chloride    . sodium chloride    . sodium chloride 20 mL/hr at 10/09/20 1510  . albumin human 12.5 g (10/09/20 1501)  . epinephrine 2 mcg/min (10/10/20 0700)  . famotidine (PEPCID) IV Stopped (10/09/20 1536)  . insulin 12 mL/hr at 10/10/20 0700  . banana bag IV 1000 mL    . lactated ringers    . lactated ringers    . lactated ringers 20 mL/hr at 10/10/20 0700  . levofloxacin (LEVAQUIN) IV    . milrinone 0.25 mcg/kg/min (10/10/20 0700)  . nitroGLYCERIN 5 mcg/min (10/10/20 0700)  . phenylephrine (NEO-SYNEPHRINE)  Adult infusion 20 mcg/min (10/10/20 0700)    PRN Medications: sodium chloride, albumin human, dextrose, lactated ringers, metoprolol tartrate, morphine injection, ondansetron (ZOFRAN) IV, oxyCODONE, sodium chloride flush, traMADol   Assessment/Plan   1. S/P CABG x4  Extubated last night. Luiz Blare coming out today. Has 3 lumen PICC. CO-OX stable.    On Epi 2 , Neo 20 mcg, milrinone 0.2, and nitro at 5. Continue to wean pressors.  - on asa, statin, plavix.  -CO-OX 70%. Renal function stable.  2. Acute systolic CHF: Echo (5/99) with EF 35-40%, mild LVH (similar this admission and last).   - CO-OX 70%  -Volume status mildly elevated. Set up CVP.  - Weaning drips as above.  3. ID:  Concern for possible aspiration PNA.  CXR with bilateral infiltrates initially, now clearing with RUL infiltrate only (suspect she asymmetric edema or mucus plugging also). Now AF, normal WBCs. Cultures NGTD.  - On cefepime prior to surgery.  4. Acute hypoxemic respiratory failure: Intubated with pulmonary edema and possible PNA. -Extubated 2/23.   -Extubated after CABG 3/1 to 4 liters [place] -Continue IS 5. Type 2 diabetes: Control improved recently.  6. VT: Noted in ER though strips not available.  - No VT. Had been on amio prior to surgery  - On amio prior to CABG   - Restart amio 200 mg daily.   Darrick Grinder NP_C  10/10/2020 8:20 AM  Patient seen with NP,  agree with the above note.   Now s/p CABG, doing well.  CI 2.7 with CVP 13.  Creatinine 1.05.  Stable MAP on epinephrine 2, milrinone 0.25, phenylephrine 20.    General: NAD Neck: JVP 10 cm, no thyromegaly or thyroid nodule.  Lungs: Clear to auscultation bilaterally with normal respiratory effort. CV: Nondisplaced PMI.  Heart regular S1/S2, no S3/S4, no murmur.  No peripheral edema.   Abdomen: Soft, nontender, no hepatosplenomegaly, no distention.  Skin: Intact without lesions or rashes.  Neurologic: Alert and oriented x 3.  Psych: Normal affect. Extremities: No clubbing or cyanosis.  HEENT: Normal.   Stop phenylephrine today then wean down epinephrine.  Continue milrinone.   Lasix 40 mg IV x 1 today and follow response.   Continue Plavix/ASA and statin.   In NSR on amiodarone 200 mg daily.   CRITICAL CARE Performed by: Loralie Champagne  Total critical care time: 35 minutes  Critical care time was exclusive of separately billable procedures and treating other patients.  Critical care was necessary to treat or prevent imminent or life-threatening deterioration.  Critical care was time spent personally by me on the following activities: development of treatment plan with patient and/or surrogate as well as nursing, discussions with consultants, evaluation of patient's response to treatment, examination of patient, obtaining history from patient or surrogate, ordering and performing treatments and interventions, ordering and review of laboratory studies, ordering and review of radiographic studies, pulse oximetry and re-evaluation of patient's condition.   Loralie Champagne 10/10/2020 10:31 AM

## 2020-10-11 ENCOUNTER — Inpatient Hospital Stay (HOSPITAL_COMMUNITY): Payer: Medicaid Other

## 2020-10-11 DIAGNOSIS — R570 Cardiogenic shock: Secondary | ICD-10-CM

## 2020-10-11 DIAGNOSIS — I5021 Acute systolic (congestive) heart failure: Secondary | ICD-10-CM

## 2020-10-11 LAB — BASIC METABOLIC PANEL
Anion gap: 10 (ref 5–15)
Anion gap: 9 (ref 5–15)
BUN: 35 mg/dL — ABNORMAL HIGH (ref 6–20)
BUN: 42 mg/dL — ABNORMAL HIGH (ref 6–20)
CO2: 23 mmol/L (ref 22–32)
CO2: 21 mmol/L — ABNORMAL LOW (ref 22–32)
Calcium: 8.4 mg/dL — ABNORMAL LOW (ref 8.9–10.3)
Calcium: 7.7 mg/dL — ABNORMAL LOW (ref 8.9–10.3)
Chloride: 98 mmol/L (ref 98–111)
Chloride: 95 mmol/L — ABNORMAL LOW (ref 98–111)
Creatinine, Ser: 1.95 mg/dL — ABNORMAL HIGH (ref 0.44–1.00)
Creatinine, Ser: 1.91 mg/dL — ABNORMAL HIGH (ref 0.44–1.00)
GFR, Estimated: 32 mL/min — ABNORMAL LOW (ref >60)
GFR, Estimated: 33 mL/min — ABNORMAL LOW (ref >60)
Glucose, Bld: 206 mg/dL — ABNORMAL HIGH (ref 70–99)
Glucose, Bld: 339 mg/dL — ABNORMAL HIGH (ref 70–99)
Potassium: 4.2 mmol/L (ref 3.5–5.1)
Potassium: 3.7 mmol/L (ref 3.5–5.1)
Sodium: 131 mmol/L — ABNORMAL LOW (ref 135–145)
Sodium: 125 mmol/L — ABNORMAL LOW (ref 135–145)

## 2020-10-11 LAB — COOXEMETRY PANEL
Carboxyhemoglobin: 0.8 % (ref 0.5–1.5)
Carboxyhemoglobin: 0.9 % (ref 0.5–1.5)
Methemoglobin: 0.8 % (ref 0.0–1.5)
Methemoglobin: 0.9 % (ref 0.0–1.5)
O2 Saturation: 73.6 %
O2 Saturation: 71.4 %
Total hemoglobin: 7.6 g/dL — ABNORMAL LOW (ref 12.0–16.0)
Total hemoglobin: 9.1 g/dL — ABNORMAL LOW (ref 12.0–16.0)

## 2020-10-11 LAB — GLUCOSE, CAPILLARY
Glucose-Capillary: 186 mg/dL — ABNORMAL HIGH (ref 70–99)
Glucose-Capillary: 173 mg/dL — ABNORMAL HIGH (ref 70–99)
Glucose-Capillary: 171 mg/dL — ABNORMAL HIGH (ref 70–99)
Glucose-Capillary: 187 mg/dL — ABNORMAL HIGH (ref 70–99)
Glucose-Capillary: 184 mg/dL — ABNORMAL HIGH (ref 70–99)
Glucose-Capillary: 184 mg/dL — ABNORMAL HIGH (ref 70–99)

## 2020-10-11 LAB — CBC
HCT: 26.5 % — ABNORMAL LOW (ref 36.0–46.0)
Hemoglobin: 8.6 g/dL — ABNORMAL LOW (ref 12.0–15.0)
MCH: 28 pg (ref 26.0–34.0)
MCHC: 32.5 g/dL (ref 30.0–36.0)
MCV: 86.3 fL (ref 80.0–100.0)
Platelets: 250 10*3/uL (ref 150–400)
RBC: 3.07 MIL/uL — ABNORMAL LOW (ref 3.87–5.11)
RDW: 14.7 % (ref 11.5–15.5)
WBC: 15.8 10*3/uL — ABNORMAL HIGH (ref 4.0–10.5)
nRBC: 0 % (ref 0.0–0.2)

## 2020-10-11 LAB — ECHOCARDIOGRAM COMPLETE
Area-P 1/2: 3.83 cm2
S' Lateral: 3.7 cm
Weight: 4158.76 oz

## 2020-10-11 MED ORDER — THIAMINE HCL 100 MG/ML IJ SOLN
Freq: Once | INTRAVENOUS | Status: AC
  Filled 2020-10-11: qty 1000

## 2020-10-11 MED ORDER — INSULIN ASPART 100 UNIT/ML ~~LOC~~ SOLN
0.0000 [IU] | Freq: Three times a day (TID) | SUBCUTANEOUS | Status: DC
  Administered 2020-10-11 (×3): 4 [IU] via SUBCUTANEOUS
  Administered 2020-10-12 (×2): 8 [IU] via SUBCUTANEOUS
  Administered 2020-10-12: 4 [IU] via SUBCUTANEOUS
  Administered 2020-10-13: 8 [IU] via SUBCUTANEOUS
  Administered 2020-10-13 – 2020-10-14 (×3): 4 [IU] via SUBCUTANEOUS
  Administered 2020-10-14: 2 [IU] via SUBCUTANEOUS
  Administered 2020-10-14: 8 [IU] via SUBCUTANEOUS
  Administered 2020-10-15: 2 [IU] via SUBCUTANEOUS
  Administered 2020-10-15 – 2020-10-17 (×3): 4 [IU] via SUBCUTANEOUS
  Administered 2020-10-17 – 2020-10-20 (×3): 2 [IU] via SUBCUTANEOUS
  Administered 2020-10-20 – 2020-10-21 (×2): 4 [IU] via SUBCUTANEOUS
  Administered 2020-10-21 – 2020-10-22 (×2): 2 [IU] via SUBCUTANEOUS
  Administered 2020-10-25: 8 [IU] via SUBCUTANEOUS
  Administered 2020-10-25 – 2020-10-26 (×3): 2 [IU] via SUBCUTANEOUS
  Administered 2020-10-26: 8 [IU] via SUBCUTANEOUS
  Administered 2020-10-27: 2 [IU] via SUBCUTANEOUS
  Administered 2020-10-27: 4 [IU] via SUBCUTANEOUS

## 2020-10-11 MED ORDER — FUROSEMIDE 10 MG/ML IJ SOLN
40.0000 mg | Freq: Two times a day (BID) | INTRAMUSCULAR | Status: DC
  Administered 2020-10-11: 40 mg via INTRAVENOUS
  Filled 2020-10-11: qty 4

## 2020-10-11 MED ORDER — NOREPINEPHRINE 4 MG/250ML-% IV SOLN
5.0000 ug/min | INTRAVENOUS | Status: DC
  Administered 2020-10-11: 5 ug/min via INTRAVENOUS
  Administered 2020-10-11: 3 ug/min via INTRAVENOUS
  Filled 2020-10-11 (×2): qty 250

## 2020-10-11 NOTE — Progress Notes (Addendum)
Patient ID: Tiffany Le, female   DOB: 10/27/1975, 45 y.o.   MRN: [id number]     Advanced Heart Failure Rounding Note  PCP-Cardiologist: Quay Burow, MD   Subjective:   10/09/20 S/P CABG x4  Left Radial Harvest 3/2 Epi off. Given 40 mg IV lasix. Sluggish urine output. Had some low BPs  On Neo 25 + milrinone 0.25. CO-OX 74%.  Creatinine trending up 1.5>1.9   Complaining of soreness. Walked with assistance.   Objective:   Weight Range: 117.9 kg Body mass index is 44.62 kg/m.   Vital Signs:   Temp:  [97 F (36.1 C)-98.8 F (37.1 C)] 97.9 F (36.6 C) (03/02 2344) Pulse Rate:  [30-102] 74 (03/03 0700) Resp:  [13-58] 30 (03/03 0700) BP: (92-143)/(37-83) 108/47 (03/03 0700) SpO2:  [92 %-100 %] 92 % (03/03 0700) Arterial Line BP: (96-144)/(43-83) 96/43 (03/02 2100) Weight:  [117.9 kg] 117.9 kg (03/03 0636) Last BM Date: 10/03/20  Weight change: Filed Weights   10/09/20 0210 10/10/20 0618 10/11/20 0636  Weight: 111.9 kg 115.8 kg 117.9 kg    Intake/Output:   Intake/Output Summary (Last 24 hours) at 10/11/2020 0734 Last data filed at 10/11/2020 1062 Gross per 24 hour  Intake 1721.08 ml  Output 781 ml  Net 940.08 ml      Physical Exam  CVP 12 in the chair.  General:  Sitting in the chair.. No resp difficulty HEENT: normal Neck: supple. JVPp difficult to assess.  Carotids 2+ bilat; no bruits. No lymphadenopathy or thryomegaly appreciated. Cor: PMI nondisplaced. Regular rate & rhythm. No rubs, gallops or murmurs. Lungs: clear Abdomen: soft, nontender, nondistended. No hepatosplenomegaly. No bruits or masses. Good bowel sounds. Extremities: no cyanosis, clubbing, rash, edema. RUE PICC. LUE dressing.  Neuro: alert & orientedx3, cranial nerves grossly intact. moves all 4 extremities w/o difficulty. Affect flat    Telemetry   SR 70-80s  Labs    CBC Recent Labs    10/10/20 1640 10/11/20 0414  WBC 24.1* 15.8*  HGB 9.3* 8.6*  HCT 29.5* 26.5*  MCV 86.3 86.3   PLT 288 694   Basic Metabolic Panel Recent Labs    10/10/20 0424 10/10/20 1640 10/11/20 0414  NA 135 132* 131*  K 3.9 4.2 4.2  CL 101 99 98  CO2 23 22 23   GLUCOSE 228* 262* 206*  BUN 26* 32* 35*  CREATININE 1.05* 1.49* 1.95*  CALCIUM 8.2* 8.4* 8.4*  MG 3.2* 3.0*  --    Liver Function Tests Recent Labs    10/09/20 0410  AST 26  ALT 31  ALKPHOS 52  BILITOT 0.8  PROT 6.5  ALBUMIN 2.2*   No results for input(s): LIPASE, AMYLASE in the last 72 hours. Cardiac Enzymes No results for input(s): CKTOTAL, CKMB, CKMBINDEX, TROPONINI in the last 72 hours.  BNP: BNP (last 3 results) Recent Labs    10/02/20 0516  BNP 947.1*    ProBNP (last 3 results) No results for input(s): PROBNP in the last 8760 hours.   D-Dimer No results for input(s): DDIMER in the last 72 hours. Hemoglobin A1C Recent Labs    10/09/20 0410  HGBA1C 7.5*   Fasting Lipid Panel No results for input(s): CHOL, HDL, LDLCALC, TRIG, CHOLHDL, LDLDIRECT in the last 72 hours. Thyroid Function Tests No results for input(s): TSH, T4TOTAL, T3FREE, THYROIDAB in the last 72 hours.  Invalid input(s): FREET3  Other results:   Imaging    No results found.   Medications:     Scheduled Medications: .  acetaminophen  1,000 mg Oral Q6H   Or  . acetaminophen (TYLENOL) oral liquid 160 mg/5 mL  1,000 mg Per Tube Q6H  . aspirin EC  81 mg Oral Daily  . atorvastatin  80 mg Oral Daily  . bisacodyl  10 mg Oral Daily  . Chlorhexidine Gluconate Cloth  6 each Topical Daily  . clopidogrel  75 mg Oral Daily  . colchicine  0.3 mg Oral BID  . docusate sodium  200 mg Oral Daily  . insulin aspart  0-24 Units Subcutaneous TID WC  . insulin detemir  15 Units Subcutaneous BID  . isosorbide dinitrate  10 mg Oral TID  . ketorolac  7.5 mg Intravenous Q6H  . levalbuterol  0.63 mg Nebulization Q6H  . pantoprazole  40 mg Oral Daily  . sodium chloride flush  10-40 mL Intracatheter Q12H  . sodium chloride flush  3 mL  Intravenous Q12H    Infusions: . sodium chloride    . sodium chloride    . sodium chloride 20 mL/hr at 10/09/20 1510  . amiodarone 30 mg/hr (10/11/20 0500)  . epinephrine Stopped (10/10/20 1833)  . lactated ringers    . lactated ringers    . lactated ringers 20 mL/hr at 10/11/20 0500  . milrinone 0.25 mcg/kg/min (10/11/20 0500)  . nitroGLYCERIN Stopped (10/10/20 1953)  . phenylephrine (NEO-SYNEPHRINE) Adult infusion 25 mcg/min (10/11/20 0500)    PRN Medications: sodium chloride, dextrose, lactated ringers, metoprolol tartrate, morphine injection, ondansetron (ZOFRAN) IV, oxyCODONE, sodium chloride flush, sodium chloride flush, traMADol   Assessment/Plan   1. S/P CABG x4  Extubated last night.  Neo 25 mcg + milrinone 0.2. Come off neo. Start norepi 3 and cut back milrinone 0.125 mcg.  CO-OX 74%  - on asa, statin, plavix, isordil.   -CO-OX 70%. Renal function stable.  2. Acute systolic CHF: Echo (4/00) with EF 35-40%, mild LVH (similar this admission and last).   - CO-OX 74%.  -CVP 12. Hold diuretics.  - Check ECHO - No room for GDMT.  3. ID:  Concern for possible aspiration PNA.  CXR with bilateral infiltrates initially, now clearing with RUL infiltrate only (suspect she asymmetric edema or mucus plugging also). Now AF, normal WBCs. Cultures NGTD.  - On cefepime prior to surgery.  4. Acute hypoxemic respiratory failure: Intubated with pulmonary edema and possible PNA. -Extubated 2/23.   -Extubated after CABG 3/1 to 4 liters [place] -Continue IS 5. Type 2 diabetes: Control improved recently.  6. VT: Noted in ER though strips not available.  - No VT. Had been on amio prior to surgery  - On amio prior to CABG   -On amio drip. 7. AKI -Creatinine trending up 1.5>1.9  - Hold diuretics   Consult PT.    Darrick Grinder NP_C  10/11/2020 7:34 AM  Patiient seen with NP, agree with the above note.   She feels "dizzy" this morning.  SBP in 100s.  Creatinine higher at 1.95 today.  She got  1 dose IV Lasix yesterday without much diuresis.  Currently on phenylephrine 25, milrinone 0.25, amiodarone gtt.  Co-ox 74%, CVP 12.   She has walked today.   General: NAD Neck: JVP 10-12 cm, no thyromegaly or thyroid nodule.  Lungs: Decreased at bases.  CV: Nondisplaced PMI.  Heart regular S1/S2, no S3/S4, no murmur.  1+ edema in feet. Abdomen: Soft, nontender, no hepatosplenomegaly, no distention.  Skin: Intact without lesions or rashes.  Neurologic: Alert and oriented x 3.  Psych: Normal affect.  Extremities: No clubbing or cyanosis.  HEENT: Normal.   I will have her stop phenylephrine and start norepinephrine 3 to maintain MAP.  Can decrease milrinone to 0.125.  Hold off on diuresis for now with significant rise in creatinine.  When creatinine stabilizes, she will need significant diuresis.   She is in NSR on amiodarone gtt.   Continue to ambulate.   CRITICAL CARE Performed by: Loralie Champagne  Total critical care time: 35 minutes  Critical care time was exclusive of separately billable procedures and treating other patients.  Critical care was necessary to treat or prevent imminent or life-threatening deterioration.  Critical care was time spent personally by me on the following activities: development of treatment plan with patient and/or surrogate as well as nursing, discussions with consultants, evaluation of patient's response to treatment, examination of patient, obtaining history from patient or surrogate, ordering and performing treatments and interventions, ordering and review of laboratory studies, ordering and review of radiographic studies, pulse oximetry and re-evaluation of patient's condition.   Loralie Champagne 10/11/2020 8:12 AM

## 2020-10-11 NOTE — Plan of Care (Signed)
  Problem: Education: Goal: Will demonstrate proper wound care and an understanding of methods to prevent future damage Outcome: Progressing Goal: Knowledge of disease or condition will improve Outcome: Progressing   Problem: Cardiac: Goal: Will achieve and/or maintain hemodynamic stability Outcome: Progressing   Problem: Respiratory: Goal: Respiratory status will improve Outcome: Progressing

## 2020-10-11 NOTE — Progress Notes (Signed)
  Echocardiogram 2D Echocardiogram has been performed.  Jennette Dubin 10/11/2020, 3:03 PM

## 2020-10-11 NOTE — Plan of Care (Signed)
  Problem: Education: Goal: Knowledge of General Education information will improve Description: Including pain rating scale, medication(s)/side effects and non-pharmacologic comfort measures Outcome: Progressing   Problem: Clinical Measurements: Goal: Ability to maintain clinical measurements within normal limits will improve Outcome: Progressing Goal: Will remain free from infection Outcome: Progressing Goal: Respiratory complications will improve Outcome: Progressing Goal: Cardiovascular complication will be avoided Outcome: Progressing   Problem: Coping: Goal: Level of anxiety will decrease Outcome: Progressing   Problem: Health Behavior/Discharge Planning: Goal: Ability to manage health-related needs will improve Outcome: Not Progressing   Problem: Clinical Measurements: Goal: Diagnostic test results will improve Outcome: Not Progressing   Problem: Activity: Goal: Risk for activity intolerance will decrease Outcome: Not Progressing   Problem: Elimination: Goal: Will not experience complications related to bowel motility Outcome: Not Progressing Goal: Will not experience complications related to urinary retention Outcome: Not Progressing

## 2020-10-11 NOTE — TOC Initial Note (Signed)
Transition of Care [facility]) - Initial/Assessment Note    Patient Details  Name: Tiffany Le MRN: [id number] Date of Birth: May 19, 1976  [facility] CM/SW Contact:    Bethena Roys, RN Phone Number: 10/11/2020, 3:23 PM  Clinical Narrative: Patient presented for respiratory distress. Prior to arrival patient was independent from home. 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 The apartment is on the second floor and the patient has has fifteen steps to get to her apartment. Physical Therapy has recommended home health physical therapy. Case Manager received verbal permission to speak with her mother. Mother has stated that she would like for her daughter to return home to her mother's house. Case Manager discussed charity services via [place] to see if the patient will qualify for services. Liaison Quentin Ore is working with the mother and will schedule a visit time to discuss finances. Case Manager will continue to follow for additional transition of care needs.                     Expected Discharge Plan: [place] Barriers to Discharge: Continued Medical Work up   Patient Goals and CMS Choice Patient states their goals for this hospitalization and ongoing recovery are:: to be able to get home      Expected Discharge Plan and Services Expected Discharge Plan: [place] In-house Referral: NA Discharge Planning Services: CM Consult Post Acute Care Choice: [place] arrangements for the past 2 months: Apartment                    HH Arranged: PT,OT [place]: [place] ([place]) Date HH Agency Contacted: 10/10/20 Time HH Agency Contacted: 1400 Representative spoke with at [place]: Quentin Ore  Prior Living Arrangements/Services Living arrangements for the past 2 months: [place] with:: Self (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 15 steps to her apartment per mother.) Patient language and need for interpreter reviewed:: Yes Do you feel safe going back to the place where you live?: Yes (Mother is going to speak with daughter to see if she can come to her home after d/c.)      Need for Family Participation in Patient Care: Yes (Comment) Care giver support system in place?: Yes (comment)   Criminal Activity/Legal Involvement Pertinent to Current Situation/Hospitalization: No - Comment as needed  Activities of Daily Living Home Assistive Devices/Equipment: CBG Meter ADL Screening (condition at time of admission) Patient's cognitive ability adequate to safely complete daily activities?: Yes Is the patient deaf or have difficulty hearing?: No Does the patient have difficulty seeing, even when wearing glasses/contacts?: No Does the patient have difficulty concentrating, remembering, or making decisions?: No Patient able to express need for assistance with ADLs?: Yes Does the patient have difficulty dressing or bathing?: No Independently performs ADLs?: Yes (appropriate for developmental age) Does the patient have difficulty walking or climbing stairs?: Yes Weakness of Legs: Both Weakness of Arms/Hands: None  Permission Sought/Granted Permission sought to share information with : Family IT sales professional granted to share info w AGENCY: Advanced for charity care- no insurance listed.        Emotional Assessment Appearance:: Appears stated age Attitude/Demeanor/Rapport: Engaged Affect (typically observed): Appropriate Orientation: :  Oriented to Situation,Oriented to  Time,Oriented to Place,Oriented to Self Alcohol / Substance Use: Not Applicable Psych Involvement: No (comment)  Admission diagnosis:  Acute pulmonary edema (HCC) [J81.0] Pulmonary edema  [J81.1] Acute respiratory failure with hypoxia (HCC) [J96.01] S/P CABG x 4 [Z95.1] Patient Active Problem List   Diagnosis Date Noted  . 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  . Pulmonary edema 10/02/2020  . Acute respiratory failure with hypoxia ([place])   . Coronary artery disease involving native coronary artery of native heart with unstable angina pectoris ([place])   . Acute on chronic combined systolic (congestive) and diastolic (congestive) heart failure ([place])   . NSTEMI (non-ST elevated myocardial infarction) ([place]) 09/25/2020  . Uncontrolled diabetes mellitus with complication, without long-term current use of insulin ([place])   . Shortness of breath 12/25/2019  . Hemoglobin A1C greater than 9%, indicating poor diabetic control 12/25/2019  . Sepsis due to urinary tract infection ([place]) 12/03/2019  . Sepsis secondary to UTI ([place]) 12/02/2019  . Class 3 severe obesity due to excess calories with serious comorbidity and body mass index (BMI) of 40.0 to 44.9 in adult ([place]) 10/24/2018  . Back pain 10/24/2018  . Elevated LFTs 01/13/2014  . Unspecified vitamin D deficiency 01/13/2014  . Anemia, iron deficiency 11/17/2012  . Essential hypertension, benign 11/17/2012  . Pure hypercholesterolemia 11/17/2012   PCP:  Azzie Glatter, FNP Pharmacy:   [facility] [place], [address] [place] [address] Phone: [phone] Fax: [place], [place] [address] [place] [address] Phone: [phone] Fax: [place], [place] - [address] [place] [address] Phone: [phone] Fax: [phone]  Readmission Risk Interventions Readmission Risk Prevention Plan 10/11/2020  Transportation Screening Complete  HRI or Home Care Consult Complete  Social Work Consult for  [place] Planning/Counseling Complete  Palliative Care Screening Not Applicable  Medication Review Press photographer) Complete  Some recent data might be hidden

## 2020-10-11 NOTE — Progress Notes (Addendum)
      [address]       [place] [address]             [phone]    POD 2 CABG   Patient stable during evening rounds. She has not had much urine output. On 75cc/hour fluid.   Creatinine stable from this morning 1.91, potassium 3.7 Remains on Amio 30mg , Milrinone 0.125, Levo 42mcg  Chest tubes out without issue.   Continue current care plan  Nicholes Rough, PA-C

## 2020-10-11 NOTE — Progress Notes (Signed)
2 Days Post-Op Procedure(s) (LRB): CORONARY ARTERY BYPASS GRAFTING (CABG), ON PUMP, TIMES FOUR, USING LEFT INTERNAL MAMMARY ARTERY AND LEFT RADIAL ARTERY (OPEN HARVEST) (N/A) RADIAL ARTERY HARVEST (Left) TRANSESOPHAGEAL ECHOCARDIOGRAM (TEE) (N/A) INDOCYANINE GREEN FLUORESCENCE IMAGING (ICG) (N/A) CLIPPING OF ATRIAL APPENDAGE USING ATRICURE 35MM CLIP (N/A) Subjective: sleepy  Objective: Vital signs in last 24 hours: Temp:  [97 F (36.1 C)-98.4 F (36.9 C)] 97.6 F (36.4 C) (03/03 0700) Pulse Rate:  [30-100] 74 (03/03 0700) Cardiac Rhythm: Normal sinus rhythm (03/03 0400) Resp:  [13-58] 30 (03/03 0700) BP: (92-143)/(37-83) 108/47 (03/03 0700) SpO2:  [92 %-100 %] 92 % (03/03 0700) Arterial Line BP: (96-144)/(43-83) 96/43 (03/02 2100) Weight:  [117.9 kg] 117.9 kg (03/03 0636)  Hemodynamic parameters for last 24 hours: PAP: (26-33)/(12-18) 26/13  Intake/Output from previous day: 03/02 0701 - 03/03 0700 In: 1721.1 [P.O.:120; I.V.:1451.1; IV Piggyback:150] Out: 781 [Urine:321; Chest Tube:460] Intake/Output this shift: No intake/output data recorded.  General appearance: alert and cooperative Neurologic: intact Heart: regular rate and rhythm, S1, S2 normal, no murmur, click, rub or gallop Lungs: clear to auscultation bilaterally Abdomen: soft, non-tender; bowel sounds normal; no masses,  no organomegaly Extremities: edema 2+ Wound: dressed, dry  Lab Results: Recent Labs    10/10/20 1640 10/11/20 0414  WBC 24.1* 15.8*  HGB 9.3* 8.6*  HCT 29.5* 26.5*  PLT 288 250   BMET:  Recent Labs    10/10/20 1640 10/11/20 0414  NA 132* 131*  K 4.2 4.2  CL 99 98  CO2 22 23  GLUCOSE 262* 206*  BUN 32* 35*  CREATININE 1.49* 1.95*  CALCIUM 8.4* 8.4*    PT/INR:  Recent Labs    10/09/20 1443  LABPROT 16.3*  INR 1.4*   ABG    Component Value Date/Time   PHART 7.364 10/09/2020 2158   HCO3 23.9 10/09/2020 2158   TCO2 25 10/09/2020 2158   ACIDBASEDEF 1.0 10/09/2020 2158    O2SAT 73.6 10/11/2020 0414   CBG (last 3)  Recent Labs    10/10/20 2342 10/11/20 0412 10/11/20 0655  GLUCAP 234* 186* 173*    Assessment/Plan: S/P Procedure(s) (LRB): CORONARY ARTERY BYPASS GRAFTING (CABG), ON PUMP, TIMES FOUR, USING LEFT INTERNAL MAMMARY ARTERY AND LEFT RADIAL ARTERY (OPEN HARVEST) (N/A) RADIAL ARTERY HARVEST (Left) TRANSESOPHAGEAL ECHOCARDIOGRAM (TEE) (N/A) INDOCYANINE GREEN FLUORESCENCE IMAGING (ICG) (N/A) CLIPPING OF ATRIAL APPENDAGE USING ATRICURE 35MM CLIP (N/A) Mobilize Diuresis remove chest tubes  Check Chem later today re: renal function   LOS: 9 days    [name] 10/11/2020

## 2020-10-12 ENCOUNTER — Inpatient Hospital Stay (HOSPITAL_COMMUNITY): Payer: Medicaid Other

## 2020-10-12 ENCOUNTER — Ambulatory Visit: Payer: Self-pay | Admitting: Cardiovascular Disease

## 2020-10-12 DIAGNOSIS — N179 Acute kidney failure, unspecified: Secondary | ICD-10-CM

## 2020-10-12 LAB — BPAM RBC
Blood Product Expiration Date: 202203232359
Blood Product Expiration Date: 202203232359
Blood Product Expiration Date: 202203302359
Blood Product Expiration Date: 202203312359
ISSUE DATE / TIME: 202203010813
ISSUE DATE / TIME: 202203010813
ISSUE DATE / TIME: 202203011124
ISSUE DATE / TIME: 202203011124
Unit Type and Rh: 5100
Unit Type and Rh: 5100
Unit Type and Rh: 5100
Unit Type and Rh: 5100

## 2020-10-12 LAB — BASIC METABOLIC PANEL
Anion gap: 11 (ref 5–15)
BUN: 48 mg/dL — ABNORMAL HIGH (ref 6–20)
CO2: 22 mmol/L (ref 22–32)
Calcium: 8.1 mg/dL — ABNORMAL LOW (ref 8.9–10.3)
Chloride: 95 mmol/L — ABNORMAL LOW (ref 98–111)
Creatinine, Ser: 2.08 mg/dL — ABNORMAL HIGH (ref 0.44–1.00)
GFR, Estimated: 30 mL/min — ABNORMAL LOW (ref >60)
Glucose, Bld: 234 mg/dL — ABNORMAL HIGH (ref 70–99)
Potassium: 4.2 mmol/L (ref 3.5–5.1)
Sodium: 128 mmol/L — ABNORMAL LOW (ref 135–145)

## 2020-10-12 LAB — GLUCOSE, CAPILLARY
Glucose-Capillary: 211 mg/dL — ABNORMAL HIGH (ref 70–99)
Glucose-Capillary: 235 mg/dL — ABNORMAL HIGH (ref 70–99)
Glucose-Capillary: 178 mg/dL — ABNORMAL HIGH (ref 70–99)
Glucose-Capillary: 155 mg/dL — ABNORMAL HIGH (ref 70–99)

## 2020-10-12 LAB — TYPE AND SCREEN
ABO/RH(D): O POS
Antibody Screen: NEGATIVE
Unit division: 0
Unit division: 0
Unit division: 0
Unit division: 0

## 2020-10-12 LAB — CBC
HCT: 28.4 % — ABNORMAL LOW (ref 36.0–46.0)
Hemoglobin: 8.7 g/dL — ABNORMAL LOW (ref 12.0–15.0)
MCH: 26.7 pg (ref 26.0–34.0)
MCHC: 30.6 g/dL (ref 30.0–36.0)
MCV: 87.1 fL (ref 80.0–100.0)
Platelets: 281 10*3/uL (ref 150–400)
RBC: 3.26 MIL/uL — ABNORMAL LOW (ref 3.87–5.11)
RDW: 14.6 % (ref 11.5–15.5)
WBC: 14.2 10*3/uL — ABNORMAL HIGH (ref 4.0–10.5)
nRBC: 0 % (ref 0.0–0.2)

## 2020-10-12 LAB — COOXEMETRY PANEL
Carboxyhemoglobin: 1.1 % (ref 0.5–1.5)
Methemoglobin: 1.1 % (ref 0.0–1.5)
O2 Saturation: 62.9 %
Total hemoglobin: 9 g/dL — ABNORMAL LOW (ref 12.0–16.0)

## 2020-10-12 MED ORDER — ISOSORBIDE DINITRATE 10 MG PO TABS
5.0000 mg | ORAL_TABLET | Freq: Three times a day (TID) | ORAL | Status: DC
  Administered 2020-10-12 – 2020-10-13 (×5): 5 mg via ORAL
  Filled 2020-10-12 (×5): qty 1

## 2020-10-12 MED ORDER — LACTULOSE 10 GM/15ML PO SOLN: 10.0000 g | Freq: Every day | ORAL | Status: DC | PRN

## 2020-10-12 MED ORDER — LACTULOSE 10 GM/15ML PO SOLN: 20.0000 g | Freq: Every day | ORAL | Status: DC | PRN

## 2020-10-12 MED ORDER — FUROSEMIDE 10 MG/ML IJ SOLN
10.0000 mg/h | INTRAVENOUS | Status: DC
  Administered 2020-10-12 – 2020-10-13 (×2): 8 mg/h via INTRAVENOUS
  Administered 2020-10-14 – 2020-10-15 (×2): 12 mg/h via INTRAVENOUS
  Administered 2020-10-16: 10 mg/h via INTRAVENOUS
  Filled 2020-10-12 (×6): qty 20

## 2020-10-12 MED ORDER — AMIODARONE HCL 200 MG PO TABS
400.0000 mg | ORAL_TABLET | Freq: Two times a day (BID) | ORAL | Status: DC
  Administered 2020-10-12 – 2020-10-16 (×10): 400 mg via ORAL
  Filled 2020-10-12 (×10): qty 2

## 2020-10-12 MED ORDER — INSULIN DETEMIR 100 UNIT/ML ~~LOC~~ SOLN
18.0000 [IU] | Freq: Two times a day (BID) | SUBCUTANEOUS | Status: DC
  Administered 2020-10-12 – 2020-10-22 (×21): 18 [IU] via SUBCUTANEOUS
  Filled 2020-10-12 (×26): qty 0.18

## 2020-10-12 NOTE — Progress Notes (Signed)
Physical Re-Evaluation and Therapy Treatment Patient Details Name: Tiffany Le MRN: [id number] DOB: 03/23/1976 Today's Date: 10/12/2020    History of Present Illness Pt is a 45 y.o. female recently admitted 2/14-2/17/22 with NSTEMI and LHC with severe CAD, recommended CABG vs. PCI, but left AMA, pt now readmitted 10/02/20 with respiratory distress; ETT 2/22-2/23. Workup for acute hypoxic respiratory failure likely secondary to acute cardiogenic pulmonary edema, possible HCAP, pleural effusion. S/p CABGx4 3/1. PMH includes HTN, DM2, CAD, HF.    PT Comments     Pt working with daughter on IS on entry. Daughter is instrumental in encouraging pt to go for a walk before taking a nap. Pt able to recall 2/3 sternal precautions prior to ambulation. Pt limited today in ambulation by increased fatigue, in presence of decreased strength and endurance. Pt is mod A for coming to EoB and for coming into standing, heavy min A for ambulation with increased cuing and chair follow for seated rest break. With return to room pt with increased fatigue requiring total A for returning to bed due to bringing trunk down before bringing LE up. D/c plans appropriate at this time however may need short term rehab if endurance does not improve. PT will continue to follow acutely.   Follow Up Recommendations  Home health PT;Supervision for mobility/OOB     Equipment Recommendations  Rolling walker with 5" wheels       Precautions / Restrictions Precautions Precautions: Fall;Sternal Precaution Booklet Issued: No Precaution Comments: able to recall 2/3 precautions Restrictions Weight Bearing Restrictions: Yes Other Position/Activity Restrictions: sternal precautions    Mobility  Bed Mobility Overal bed mobility: Needs Assistance Bed Mobility: Sit to Supine     Supine to sit: HOB elevated;Mod assist Sit to supine: Mod assist;Total assist   General bed mobility comments: increased time and effort for bringing  LE to EoB and modA for bringing trunk to upright, Pt with increased fatigue at end of ambulation requiring total A as she laid back on to her back before LE could be brought into bed in sidelying    Transfers Overall transfer level: Needs assistance Equipment used: Ambulation equipment used (EVA walker) Transfers: Sit to/from [facility] Sit to Stand: Mod assist         General transfer comment: modA x2 for 3x sit<>stand from bed and 2 from recliner during ambulation  Ambulation/Gait Ambulation/Gait assistance: Min assist Gait Distance (Feet): 30 Feet (30 x2) Assistive device:  (EVA walker) Gait Pattern/deviations: Step-through pattern;Decreased step length - right;Decreased step length - left;Shuffle;Antalgic;Trunk flexed Gait velocity: slowed Gait velocity interpretation: <1.31 ft/sec, indicative of household ambulator General Gait Details: light min to heavy min A for steadying at hips with slowed gait, constant multimodal cuing for upright posture and posterior pelvic tilt       Balance Overall balance assessment: Needs assistance Sitting-balance support: No upper extremity supported;Feet supported Sitting balance-Leahy Scale: Fair     Standing balance support: Bilateral upper extremity supported Standing balance-Leahy Scale: Poor Standing balance comment: reliant on RW for support                            Cognition Arousal/Alertness: Awake/alert Behavior During Therapy: WFL for tasks assessed/performed Overall Cognitive Status: Within Functional Limits for tasks assessed  Exercises Other Exercises Other Exercises: ISx10    General Comments General comments (skin integrity, edema, etc.): Daughter present and very supportive, encouraging pt in IS use on PT entrance      Pertinent Vitals/Pain Pain Assessment: Faces Faces Pain Scale: Hurts little more Pain Location: low back and  incision site Pain Descriptors / Indicators: Grimacing;Guarding;Tender Pain Intervention(s): Limited activity within patient's tolerance;Monitored during session;Repositioned           PT Goals (current goals can now be found in the care plan section) Acute Rehab PT Goals Patient Stated Goal: I want to know what I'm allowed to do. PT Goal Formulation: With patient/family Time For Goal Achievement: 10/19/20 Potential to Achieve Goals: Good Progress towards PT goals: Progressing toward goals    Frequency    Min 3X/week      PT Plan Current plan remains appropriate       AM-PAC PT "6 Clicks" Mobility   Outcome Measure  Help needed turning from your back to your side while in a flat bed without using bedrails?: A Little Help needed moving from lying on your back to sitting on the side of a flat bed without using bedrails?: A Lot Help needed moving to and from a bed to a chair (including a wheelchair)?: A Lot Help needed standing up from a chair using your arms (e.g., wheelchair or bedside chair)?: A Lot Help needed to walk in hospital room?: A Little Help needed climbing 3-5 steps with a railing? : Total 6 Click Score: 13    End of Session   Activity Tolerance: Patient tolerated treatment well;Patient limited by fatigue Patient left: in bed;with call bell/phone within reach;with bed alarm set;with family/visitor present Nurse Communication: Mobility status PT Visit Diagnosis: Other abnormalities of gait and mobility (R26.89);Muscle weakness (generalized) (M62.81)     Time: 5361-4431 PT Time Calculation (min) (ACUTE ONLY): 38 min  Charges:  $Gait Training: 23-37 mins                     Elizabeth B. Migdalia Dk PT, DPT Acute Rehabilitation Services Pager [phone] Office [phone]    [place] 10/12/2020, 4:45 PM
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
## 2020-10-12 NOTE — Progress Notes (Signed)
3 Days Post-Op Procedure(s) (LRB): CORONARY ARTERY BYPASS GRAFTING (CABG), ON PUMP, TIMES FOUR, USING LEFT INTERNAL MAMMARY ARTERY AND LEFT RADIAL ARTERY (OPEN HARVEST) (N/A) RADIAL ARTERY HARVEST (Left) TRANSESOPHAGEAL ECHOCARDIOGRAM (TEE) (N/A) INDOCYANINE GREEN FLUORESCENCE IMAGING (ICG) (N/A) CLIPPING OF ATRIAL APPENDAGE USING ATRICURE 35MM CLIP (N/A) Subjective: Feeling better  Objective: Vital signs in last 24 hours: Temp:  [97.1 F (36.2 C)-98.1 F (36.7 C)] 98.1 F (36.7 C) (03/04 0645) Pulse Rate:  [74-94] 80 (03/04 0700) Cardiac Rhythm: Normal sinus rhythm (03/04 0340) Resp:  [15-37] 28 (03/04 0700) BP: (99-161)/(50-88) 161/88 (03/04 0700) SpO2:  [91 %-100 %] 91 % (03/04 0813) Weight:  [117.8 kg] 117.8 kg (03/04 0500)  Hemodynamic parameters for last 24 hours:    Intake/Output from previous day: 03/03 0701 - 03/04 0700 In: 1809.2 [P.O.:360; I.V.:1449.2] Out: 535 [Urine:395; Chest Tube:140] Intake/Output this shift: No intake/output data recorded.  General appearance: alert and cooperative Neurologic: intact Heart: regular rate and rhythm, S1, S2 normal, no murmur, click, rub or gallop Lungs: clear to auscultation bilaterally Abdomen: soft, non-tender; bowel sounds normal; no masses,  no organomegaly Extremities: edema 2+ Wound: dressed, dry  Lab Results: Recent Labs    10/11/20 0414 10/12/20 0329  WBC 15.8* 14.2*  HGB 8.6* 8.7*  HCT 26.5* 28.4*  PLT 250 281   BMET:  Recent Labs    10/11/20 1531 10/12/20 0329  NA 125* 128*  K 3.7 4.2  CL 95* 95*  CO2 21* 22  GLUCOSE 339* 234*  BUN 42* 48*  CREATININE 1.91* 2.08*  CALCIUM 7.7* 8.1*    PT/INR:  Recent Labs    10/09/20 1443  LABPROT 16.3*  INR 1.4*   ABG    Component Value Date/Time   PHART 7.364 10/09/2020 2158   HCO3 23.9 10/09/2020 2158   TCO2 25 10/09/2020 2158   ACIDBASEDEF 1.0 10/09/2020 2158   O2SAT 62.9 10/12/2020 0329   CBG (last 3)  Recent Labs    10/11/20 1940  10/11/20 2137 10/12/20 0643  GLUCAP 184* 184* 211*    Assessment/Plan: S/P Procedure(s) (LRB): CORONARY ARTERY BYPASS GRAFTING (CABG), ON PUMP, TIMES FOUR, USING LEFT INTERNAL MAMMARY ARTERY AND LEFT RADIAL ARTERY (OPEN HARVEST) (N/A) RADIAL ARTERY HARVEST (Left) TRANSESOPHAGEAL ECHOCARDIOGRAM (TEE) (N/A) INDOCYANINE GREEN FLUORESCENCE IMAGING (ICG) (N/A) CLIPPING OF ATRIAL APPENDAGE USING ATRICURE 35MM CLIP (N/A) Mobilize Diuresis pulm toilet  PT/OT Leave milrinone today while diuresing Check co-ox later    LOS: 10 days    [name] 10/12/2020

## 2020-10-12 NOTE — Plan of Care (Signed)
  Problem: Education: Goal: Knowledge of General Education information will improve Description: Including pain rating scale, medication(s)/side effects and non-pharmacologic comfort measures Outcome: Progressing   Problem: Health Behavior/Discharge Planning: Goal: Ability to manage health-related needs will improve Outcome: Progressing   Problem: Clinical Measurements: Goal: Ability to maintain clinical measurements within normal limits will improve Outcome: Progressing Goal: Will remain free from infection Outcome: Progressing Goal: Diagnostic test results will improve Outcome: Progressing Goal: Respiratory complications will improve Outcome: Progressing Goal: Cardiovascular complication will be avoided Outcome: Progressing   Problem: Activity: Goal: Risk for activity intolerance will decrease Outcome: Progressing   Problem: Coping: Goal: Level of anxiety will decrease Outcome: Progressing   Problem: Elimination: Goal: Will not experience complications related to bowel motility Outcome: Not Progressing Goal: Will not experience complications related to urinary retention Outcome: Not Progressing

## 2020-10-12 NOTE — Progress Notes (Addendum)
Patient ID: Tiffany Le, female   DOB: [birth year], 45 y.o.   MRN: [id number]     Advanced Heart Failure Rounding Note  PCP-Cardiologist: Quay Burow, MD   Subjective:   10/09/20 S/P CABG x4  Left Radial Harvest 3/2 Epi off. Given 40 mg IV lasix. Sluggish urine output. Had some low BPs and AKI   Lasix held yesterday. Off phenylephrine. Now on NE 5 + Milrinone 0.125. Co-ox lower today at 63% (down from 71%).   Scr continues to rise, 1.5>1.9>2.1. Oliguric, only 395 cc in UOP yesterday. CVP 12-13. No hypotension. SBPs elevated in the 150s-160s.   Na 128   Echo repeated yesterday. EF slightly better 40-45%. RV mildly reduced.   OOB sitting up in chair. Feels ok. No major complaints.    Objective:   Weight Range: 117.8 kg Body mass index is 44.58 kg/m.   Vital Signs:   Temp:  [97.1 F (36.2 C)-98.1 F (36.7 C)] 98.1 F (36.7 C) (03/04 0645) Pulse Rate:  [74-94] 80 (03/04 0700) Resp:  [15-37] 28 (03/04 0700) BP: (99-161)/(50-88) 161/88 (03/04 0700) SpO2:  [93 %-100 %] 95 % (03/04 0700) Weight:  [117.8 kg] 117.8 kg (03/04 0500) Last BM Date: 10/03/20  Weight change: Filed Weights   10/10/20 0618 10/11/20 0636 10/12/20 0500  Weight: 115.8 kg 117.9 kg 117.8 kg    Intake/Output:   Intake/Output Summary (Last 24 hours) at 10/12/2020 0714 Last data filed at 10/12/2020 0533 Gross per 24 hour  Intake 1739 ml  Output 535 ml  Net 1204 ml      Physical Exam   CVP 12-13  General:  Well appearing OOB sitting up in chair. No respiratory difficulty HEENT: normal Neck: supple. + Rt IV CVC. Carotids 2+ bilat; no bruits. No lymphadenopathy or thyromegaly appreciated. Cor: PMI nondisplaced. Regular rate & rhythm. No rubs, gallops or murmurs. + sternal wound vac  Lungs: decreased BS at the bases bilaterally  Abdomen: soft, nontender, nondistended. No hepatosplenomegaly. No bruits or masses. Good bowel sounds. Extremities: no cyanosis, clubbing, rash, trace- 1+ bilateral LE  edema Neuro: alert & oriented x 3, cranial nerves grossly intact. moves all 4 extremities w/o difficulty. Affect pleasant.    Telemetry   NR 80s, 5 beat run NSVT   Labs    CBC Recent Labs    10/11/20 0414 10/12/20 0329  WBC 15.8* 14.2*  HGB 8.6* 8.7*  HCT 26.5* 28.4*  MCV 86.3 87.1  PLT 250 580   Basic Metabolic Panel Recent Labs    10/10/20 0424 10/10/20 1640 10/11/20 0414 10/11/20 1531 10/12/20 0329  NA 135 132*   < > 125* 128*  K 3.9 4.2   < > 3.7 4.2  CL 101 99   < > 95* 95*  CO2 23 22   < > 21* 22  GLUCOSE 228* 262*   < > 339* 234*  BUN 26* 32*   < > 42* 48*  CREATININE 1.05* 1.49*   < > 1.91* 2.08*  CALCIUM 8.2* 8.4*   < > 7.7* 8.1*  MG 3.2* 3.0*  --   --   --    < > = values in this interval not displayed.   Liver Function Tests No results for input(s): AST, ALT, ALKPHOS, BILITOT, PROT, ALBUMIN in the last 72 hours. No results for input(s): LIPASE, AMYLASE in the last 72 hours. Cardiac Enzymes No results for input(s): CKTOTAL, CKMB, CKMBINDEX, TROPONINI in the last 72 hours.  BNP: BNP (last 3 results) Recent  Labs    10/02/20 0516  BNP 947.1*    ProBNP (last 3 results) No results for input(s): PROBNP in the last 8760 hours.   D-Dimer No results for input(s): DDIMER in the last 72 hours. Hemoglobin A1C No results for input(s): HGBA1C in the last 72 hours. Fasting Lipid Panel No results for input(s): CHOL, HDL, LDLCALC, TRIG, CHOLHDL, LDLDIRECT in the last 72 hours. Thyroid Function Tests No results for input(s): TSH, T4TOTAL, T3FREE, THYROIDAB in the last 72 hours.  Invalid input(s): FREET3  Other results:   Imaging    DG Chest Port 1 View  Result Date: 10/12/2020 CLINICAL DATA:  Status post chest tube removal EXAM: PORTABLE CHEST 1 VIEW COMPARISON:  10/11/2020 FINDINGS: Right jugular sheath remains in place. Right-sided PICC line is again noted and stable. Mediastinal drain, pericardial drain and bilateral thoracostomy catheters have  been removed in the interval. No pneumothorax is noted. Minimal basilar atelectasis and effusion is seen on the left. Mild right basilar atelectasis is noted as well. IMPRESSION: Interval removal of tubes and lines without evidence of pneumothorax. Mild bibasilar atelectasis and small left effusion are noted. Electronically Signed   By: Inez Catalina M.D.   On: 10/12/2020 06:29   ECHOCARDIOGRAM COMPLETE  Result Date: 10/11/2020    ECHOCARDIOGRAM REPORT   Patient Name:   Tiffany Le Date of Exam: 10/11/2020 Medical Rec #:  [id number]        Height:       64.0 in Accession #:    [id number]       Weight:       259.9 lb Date of Birth:  07/11/1976        BSA:          2.187 m Patient Age:    60 years         BP:           108/47 mmHg Patient Gender: F                HR:           94 bpm. Exam Location:  Inpatient Procedure: 2D Echo Indications:    CHF-Acute Systolic T41.96; s/p CABG 10/09/20  History:        Patient has prior history of Echocardiogram examinations, most                 recent 10/09/2020. CAD, Prior CABG; Risk Factors:Hypertension,                 Diabetes and Dyslipidemia.  Sonographer:    Mikki Santee RDCS (AE) Referring Phys: [place]  Sonographer Comments: No subcostal window. IMPRESSIONS  1. Difficult study due to poor acoustic windows and definity not administered.  2. The left ventricular ejection fraction, by estimation, is 40 to 45%. The left ventricle has mildly decreased function.  3. Based on limited views, there is severe hypokinesis of all septal segments, the mid-to-apical anterior LV segments, and apex.  4. There is mild asymmetric left ventricular hypertrophy of the posterior LV segment.  5. Left ventricular diastolic parameters are indeterminate due to arrhythmia. Elevated left atrial pressure.  6. Right ventricular systolic function is mildly reduced. The right ventricular size is normal.  7. The mitral valve is normal in structure. Mild mitral valve regurgitation.  8.  The aortic valve is tricuspid. Aortic valve regurgitation is not visualized. No aortic stenosis is present. Comparison(s): Compared to TTE on 10/02/20, the LVEF has improved to 40-45%  with improved wall motion based on limited views. FINDINGS  Left Ventricle: Difficult study due to poor acoustic windows and definity not administered. Left ventricular ejection fraction, by estimation, is 40 to 45%. The left ventricle has mildly decreased function. The left ventricle demonstrates regional wall motion abnormalities. Based on limited visualization, there is severe hypokinesis of all septal segments, the mid-to-apical anterior LV segments, and the apex.The left ventricular internal cavity size was normal in size. There is mild asymmetric left ventricular hypertrophy of the posterior segment. Left ventricular diastolic parameters are indeterminate. Elevated left atrial pressure. Right Ventricle: The right ventricular size is normal. No increase in right ventricular wall thickness. Right ventricular systolic function is mildly reduced. Left Atrium: Left atrial size was normal in size. Right Atrium: Right atrial size was normal in size. Pericardium: There is no evidence of pericardial effusion. Mitral Valve: The mitral valve is normal in structure. Mild mitral annular calcification. Mild mitral valve regurgitation. Tricuspid Valve: The tricuspid valve is normal in structure. Tricuspid valve regurgitation is mild. Aortic Valve: The aortic valve is tricuspid. Aortic valve regurgitation is not visualized. No aortic stenosis is present. Pulmonic Valve: The pulmonic valve was normal in structure. Pulmonic valve regurgitation is not visualized. Aorta: The aortic root is normal in size and structure. IAS/Shunts: No atrial level shunt detected by color flow Doppler.  LEFT VENTRICLE PLAX 2D LVIDd:         5.10 cm  Diastology LVIDs:         3.70 cm  LV e' medial:    6.64 cm/s LV PW:         1.20 cm  LV E/e' medial:  17.0 LV IVS:         0.80 cm  LV e' lateral:   7.18 cm/s LVOT diam:     2.30 cm  LV E/e' lateral: 15.7 LVOT Area:     4.15 cm  RIGHT VENTRICLE RV S prime:     7.94 cm/s TAPSE (M-mode): 1.7 cm LEFT ATRIUM             Index       RIGHT ATRIUM           Index LA diam:        4.40 cm 2.01 cm/m  RA Area:     15.00 cm LA Vol (A2C):   51.0 ml 23.32 ml/m RA Volume:   37.40 ml  17.10 ml/m LA Vol (A4C):   59.8 ml 27.35 ml/m LA Biplane Vol: 56.9 ml 26.02 ml/m   AORTA Ao Root diam: 3.40 cm MITRAL VALVE                TRICUSPID VALVE MV Area (PHT): 3.83 cm     TR Peak grad:   27.9 mmHg MV Decel Time: 198 msec     TR Vmax:        264.00 cm/s MV E velocity: 113.00 cm/s MV A velocity: 49.00 cm/s   SHUNTS MV E/A ratio:  2.31         Systemic Diam: 2.30 cm Gwyndolyn Kaufman MD Electronically signed by Gwyndolyn Kaufman MD Signature Date/Time: 10/11/2020/4:01:53 PM    Final      Medications:     Scheduled Medications: . acetaminophen  1,000 mg Oral Q6H   Or  . acetaminophen (TYLENOL) oral liquid 160 mg/5 mL  1,000 mg Per Tube Q6H  . aspirin EC  81 mg Oral Daily  . atorvastatin  80 mg Oral Daily  . bisacodyl  10 mg Oral Daily  . Chlorhexidine Gluconate Cloth  6 each Topical Daily  . clopidogrel  75 mg Oral Daily  . colchicine  0.3 mg Oral BID  . docusate sodium  200 mg Oral Daily  . insulin aspart  0-24 Units Subcutaneous TID WC  . insulin detemir  15 Units Subcutaneous BID  . isosorbide dinitrate  10 mg Oral TID  . levalbuterol  0.63 mg Nebulization Q6H  . pantoprazole  40 mg Oral Daily  . sodium chloride flush  10-40 mL Intracatheter Q12H  . sodium chloride flush  3 mL Intravenous Q12H    Infusions: . sodium chloride    . sodium chloride    . sodium chloride 20 mL/hr at 10/09/20 1510  . amiodarone 30 mg/hr (10/12/20 0533)  . lactated ringers    . lactated ringers    . lactated ringers 10 mL/hr at 10/12/20 0533  . milrinone 0.125 mcg/kg/min (10/12/20 0533)  . norepinephrine (LEVOPHED) Adult infusion 5 mcg/min  (10/12/20 0533)    PRN Medications: sodium chloride, dextrose, lactated ringers, metoprolol tartrate, morphine injection, ondansetron (ZOFRAN) IV, oxyCODONE, sodium chloride flush, sodium chloride flush, traMADol   Assessment/Plan   1. CAD: S/P CABG x4  - Post surgical management per CT Surgery - ASA 81 mg + Plavix 75 mg  - Atorvastatin 80 mg  2. Acute systolic CHF: Echo (4/13) with EF 35-40%, mild LVH (similar this admission and last).   - Ischemic CM now s/p CABG - on NE 5 + Milrinone 0.125. Co-ox 63% - CVP 12. Diuretics held yesterday for AKI. Suspect ATN  - Repeat echo 3/3 LVEF improved 40-45%. RV mildly reduced - Restart Lasix, Trial 80 mg x 1 and follow response - No ARNi, spiro nor dig given AKI  3. ID:  Concern for possible aspiration PNA.  CXR with bilateral infiltrates initially, now clearing with RUL infiltrate only (suspect she asymmetric edema or mucus plugging also). Now AF, WBCs downtrending. Cultures NGTD.  - On cefepime prior to surgery.  4. Acute hypoxemic respiratory failure: Intubated with pulmonary edema and possible PNA on admission. Extubated 2/23.   -Extubated after CABG 3/1 to 4 liters Barry -Continue IS 5. Type 2 diabetes: Control improved recently.  6. VT: Noted in ER though strips not available.  - stable on amio gtt, continue while on inotropes   7. AKI -Creatinine trending up 1.5>1.9>>2.1  - Suspect ATN from hypotension post CABG - Co-ox 63% on inotropes  - Continue NE for BP support - Give dose of 80 IV Lasix x 1 and follow response. If poor response/ rising SCr will need nephrology consultation.  8. Hyponatremia - Hypervolemic hyponatremia - Fluid restrict - Diuresis w/ IV Lasix   Lyda Jester PA-C  10/12/2020 7:14 AM  Patient seen with PA, agree with the above note .  This morning, CVP 15 on my read (higher than yesterday), co-ox 63%.  She remains on NE 5 and milrinone 0.125, BP is elevated.  Poor UOP yesterday, did not get Lasix with  creatinine rise.  Creatinine mildly higher today at 2.08.   No complaints, denies dyspnea.    Repeat echo with EF improved 40-45%, mild RV dysfunction .  General: NAD Neck: JVP 14 cm, no thyromegaly or thyroid nodule.  Lungs: Clear to auscultation bilaterally with normal respiratory effort. CV: Nondisplaced PMI.  Heart regular S1/S2, no S3/S4, no murmur.  1+ edema to knees. Abdomen: Soft, nontender, no hepatosplenomegaly, no distention.  Skin: Intact without lesions or rashes.  Neurologic: Alert and oriented x 3.  Psych: Normal affect. Extremities: No clubbing or cyanosis.  HEENT: Normal.   Creatinine up to 2.08 with poor UOP yesterday (no diuretics).  Suspect post-op ATN.  CVP 15, BP is normal to elevated.  - Would keep milrinone at 0.25 mcg/kg/min for now.  - Can wean down NE with elevated BP.  - Dr. Orvan Seen has started trial of Lasix 8 mg/hr, agree.  Will follow creatinine closely.   Hypervolemic hyponatremia, fluid restrict.   CRITICAL CARE Performed by: Loralie Champagne  Total critical care time: 35 minutes  Critical care time was exclusive of separately billable procedures and treating other patients.  Critical care was necessary to treat or prevent imminent or life-threatening deterioration.  Critical care was time spent personally by me on the following activities: development of treatment plan with patient and/or surrogate as well as nursing, discussions with consultants, evaluation of patient's response to treatment, examination of patient, obtaining history from patient or surrogate, ordering and performing treatments and interventions, ordering and review of laboratory studies, ordering and review of radiographic studies, pulse oximetry and re-evaluation of patient's condition.  Loralie Champagne 10/12/2020 9:52 AM

## 2020-10-12 NOTE — Progress Notes (Signed)
Inpatient Diabetes Program Recommendations  AACE/ADA: New Consensus Statement on Inpatient Glycemic Control (2015)  Target Ranges:  Prepandial:   less than 140 mg/dL      Peak postprandial:   less than 180 mg/dL (1-2 hours)      Critically ill patients:  140 - 180 mg/dL   Lab Results  Component Value Date   GLUCAP 211 (H) 10/12/2020   HGBA1C 7.5 (H) 10/09/2020    Review of Glycemic Control Results for Tiffany, Le (MRN [id number]) as of 10/12/2020 09:45  Ref. Range 10/11/2020 15:23 10/11/2020 19:40 10/11/2020 21:37 10/12/2020 06:43  Glucose-Capillary Latest Ref Range: 70 - 99 mg/dL 187 (H) 184 (H) 184 (H) 211 (H)   Diabetes history: Type 2 DM Outpatient Diabetes medications: Lantus 35 units QD, Glipizide 10 mg BID Current orders for Inpatient glycemic control: Levemir 15 units BID, Novolog 0-24 units TID  Inpatient Diabetes Program Recommendations:    Consider increasing Levemir to 18 units BID.   Thanks, Bronson Curb, MSN, RNC-OB Diabetes Coordinator [phone] (8a-5p)

## 2020-10-12 NOTE — Evaluation (Signed)
Occupational Therapy Evaluation Patient Details Name: Tiffany Le MRN: [id number] DOB: Jun 11, 1976 Today's Date: 10/12/2020    History of Present Illness Pt is a 45 y.o. female recently admitted 2/14-2/17/22 with NSTEMI and LHC with severe CAD, recommended CABG vs. PCI, but left AMA, pt now readmitted 10/02/20 with respiratory distress; ETT 2/22-2/23. Workup for acute hypoxic respiratory failure likely secondary to acute cardiogenic pulmonary edema, possible HCAP, pleural effusion. S/p CABGx4 3/1. PMH includes HTN, DM2, CAD, HF.   Clinical Impression   Patient reassessed this date s/p CABGx4 on 3/1.  Deficits listed below.  Overall she is doing well considering, she is anxious about injuring her sternal incision site, and wants to know the best ways to move.  Sternal precautions reviewed, discussed ADL in the "tube", and figure four sitting for lower body ADL.  The hip kit will need to be introduced.  Pushing through her knees to stand was practiced, and she needed up to Mod A for sit to stand from the recliner.  Overall transfer status was Min A with the RW.  She is needing more assist for ADL than prior to the surgery, but as she becomes more comfortable with moving, she should improve her independence.  Acute OT is indicated to maximize her functional status; she is planning to discharge home, but is open to post acute rehab if needed.      Follow Up Recommendations  Home health OT    Equipment Recommendations  3 in 1 bedside commode;Tub/shower seat    Recommendations for Other Services       Precautions / Restrictions Precautions Precautions: Fall;Sternal Precaution Booklet Issued: No Restrictions Weight Bearing Restrictions: Yes RUE Weight Bearing: Weight bearing as tolerated LUE Weight Bearing: Weight bearing as tolerated Other Position/Activity Restrictions: B UE's pushing through knees to stand.      Mobility Bed Mobility Overal bed mobility: Needs Assistance Bed  Mobility: Sit to Supine       Sit to supine: Mod assist   General bed mobility comments: VC's for side lying and mod A for feet. Patient Response: Cooperative;Anxious  Transfers Overall transfer level: Needs assistance Equipment used: Rolling walker (2 wheeled) Transfers: Sit to/from [facility] Sit to Stand: Mod assist Stand pivot transfers: Min assist            Balance Overall balance assessment: Needs assistance Sitting-balance support: No upper extremity supported;Feet supported Sitting balance-Leahy Scale: Fair     Standing balance support: Bilateral upper extremity supported Standing balance-Leahy Scale: Poor Standing balance comment: reliant on RW for support                           ADL either performed or assessed with clinical judgement   ADL Overall ADL's : Needs assistance/impaired Eating/Feeding: Independent;Sitting   Grooming: Wash/dry hands;Wash/dry face;Oral care;Set up;Sitting   Upper Body Bathing: Sitting;Moderate assistance   Lower Body Bathing: Sitting/lateral leans;Maximal assistance   Upper Body Dressing : Moderate assistance;Sitting   Lower Body Dressing: Sit to/from stand;Maximal assistance;Sitting/lateral leans               Functional mobility during ADLs: Minimal assistance;Rolling walker       Vision Baseline Vision/History: No visual deficits Patient Visual Report: No change from baseline       Perception     Praxis      Pertinent Vitals/Pain Faces Pain Scale: Hurts little more Pain Location: low back and incision site Pain Descriptors / Indicators: Grimacing;Guarding;Tender  Pain Intervention(s): Monitored during session     Hand Dominance Right   Extremity/Trunk Assessment Upper Extremity Assessment Upper Extremity Assessment: Overall WFL for tasks assessed   Lower Extremity Assessment Lower Extremity Assessment: Defer to PT evaluation   Cervical / Trunk Assessment Cervical /  Trunk Assessment: Normal   Communication Communication Communication: No difficulties   Cognition Arousal/Alertness: Awake/alert Behavior During Therapy: WFL for tasks assessed/performed Overall Cognitive Status: Within Functional Limits for tasks assessed                                     General Comments   VSS on RA               Home Living Family/patient expects to be discharged to:: Private residence Living Arrangements: Alone Available Help at Discharge: Family;Available 24 hours/day Type of Home: Apartment Home Access: Stairs to enter Entrance Stairs-Number of Steps: 16 Entrance Stairs-Rails: Right Home Layout: One level     Bathroom Shower/Tub: Teacher, early years/pre: Standard     Home Equipment: Shower seat   Additional Comments: Plans to borrow shower seat and BSC (to put over toilet) from friends      Prior Functioning/Environment Level of Independence: Independent                 OT Problem List: Decreased strength;Decreased activity tolerance;Impaired balance (sitting and/or standing);Decreased knowledge of use of DME or AE;Cardiopulmonary status limiting activity      OT Treatment/Interventions: Self-care/ADL training;Therapeutic exercise;Energy conservation;DME and/or AE instruction;Balance training;Patient/family education;Therapeutic activities    OT Goals(Current goals can be found in the care plan section) Acute Rehab OT Goals Patient Stated Goal: I want to know what I'm allowed to do. OT Goal Formulation: With patient Time For Goal Achievement: 10/20/20 Potential to Achieve Goals: Good  OT Frequency: Min 2X/week   Barriers to D/C:    none noted       Co-evaluation              AM-PAC OT "6 Clicks" Daily Activity     Outcome Measure Help from another person eating meals?: None Help from another person taking care of personal grooming?: None Help from another person toileting, which includes  using toliet, bedpan, or urinal?: A Lot Help from another person bathing (including washing, rinsing, drying)?: A Lot Help from another person to put on and taking off regular upper body clothing?: A Lot Help from another person to put on and taking off regular lower body clothing?: A Lot 6 Click Score: 16   End of Session Equipment Utilized During Treatment: Gait belt Nurse Communication: Mobility status  Activity Tolerance: Patient tolerated treatment well Patient left: in bed;with call bell/phone within reach;with nursing/sitter in room  OT Visit Diagnosis: Unsteadiness on feet (R26.81);Muscle weakness (generalized) (M62.81);Dizziness and giddiness (R42)                Time: 2162-4469 OT Time Calculation (min): 19 min Charges:  OT Evaluation $OT Re-eval: 1 Re-eval  10/12/2020  Rich, OTR/L  Acute Rehabilitation Services  Office:  [place] 10/12/2020, 10:14 AM

## 2020-10-12 NOTE — Progress Notes (Signed)
      [address]       [place] [address]             [phone]      POD # 3 CABG x 4, LAA clip  Resting comfortably  BP 135/62   Pulse 79   Temp 98.2 F (36.8 C) (Oral)   Resp (!) 27   Wt 117.8 kg   LMP  (LMP Unknown)   SpO2 92%   BMI 44.58 kg/m    Intake/Output Summary (Last 24 hours) at 10/12/2020 1659 Last data filed at 10/12/2020 1600 Gross per 24 hour  Intake 2890.89 ml  Output 610 ml  Net 2280.89 ml   Continue milrinone and Lasix  Remo Lipps C. Roxan Hockey, MD [facility] [phone]

## 2020-10-13 DIAGNOSIS — Z951 Presence of aortocoronary bypass graft: Secondary | ICD-10-CM

## 2020-10-13 LAB — GLUCOSE, CAPILLARY
Glucose-Capillary: 168 mg/dL — ABNORMAL HIGH (ref 70–99)
Glucose-Capillary: 235 mg/dL — ABNORMAL HIGH (ref 70–99)
Glucose-Capillary: 168 mg/dL — ABNORMAL HIGH (ref 70–99)
Glucose-Capillary: 149 mg/dL — ABNORMAL HIGH (ref 70–99)

## 2020-10-13 LAB — BASIC METABOLIC PANEL
Anion gap: 10 (ref 5–15)
Anion gap: 9 (ref 5–15)
BUN: 49 mg/dL — ABNORMAL HIGH (ref 6–20)
BUN: 55 mg/dL — ABNORMAL HIGH (ref 6–20)
CO2: 23 mmol/L (ref 22–32)
CO2: 23 mmol/L (ref 22–32)
Calcium: 8 mg/dL — ABNORMAL LOW (ref 8.9–10.3)
Calcium: 8 mg/dL — ABNORMAL LOW (ref 8.9–10.3)
Chloride: 95 mmol/L — ABNORMAL LOW (ref 98–111)
Chloride: 97 mmol/L — ABNORMAL LOW (ref 98–111)
Creatinine, Ser: 1.86 mg/dL — ABNORMAL HIGH (ref 0.44–1.00)
Creatinine, Ser: 1.54 mg/dL — ABNORMAL HIGH (ref 0.44–1.00)
GFR, Estimated: 34 mL/min — ABNORMAL LOW (ref >60)
GFR, Estimated: 42 mL/min — ABNORMAL LOW (ref >60)
Glucose, Bld: 167 mg/dL — ABNORMAL HIGH (ref 70–99)
Glucose, Bld: 169 mg/dL — ABNORMAL HIGH (ref 70–99)
Potassium: 3.8 mmol/L (ref 3.5–5.1)
Potassium: 3.3 mmol/L — ABNORMAL LOW (ref 3.5–5.1)
Sodium: 128 mmol/L — ABNORMAL LOW (ref 135–145)
Sodium: 129 mmol/L — ABNORMAL LOW (ref 135–145)

## 2020-10-13 LAB — COOXEMETRY PANEL
Carboxyhemoglobin: 1.1 % (ref 0.5–1.5)
Methemoglobin: 1.2 % (ref 0.0–1.5)
O2 Saturation: 70.5 %
Total hemoglobin: 8.7 g/dL — ABNORMAL LOW (ref 12.0–16.0)

## 2020-10-13 LAB — CBC
HCT: 26.3 % — ABNORMAL LOW (ref 36.0–46.0)
Hemoglobin: 8.6 g/dL — ABNORMAL LOW (ref 12.0–15.0)
MCH: 28 pg (ref 26.0–34.0)
MCHC: 32.7 g/dL (ref 30.0–36.0)
MCV: 85.7 fL (ref 80.0–100.0)
Platelets: 298 10*3/uL (ref 150–400)
RBC: 3.07 MIL/uL — ABNORMAL LOW (ref 3.87–5.11)
RDW: 14.7 % (ref 11.5–15.5)
WBC: 13.3 10*3/uL — ABNORMAL HIGH (ref 4.0–10.5)
nRBC: 0 % (ref 0.0–0.2)

## 2020-10-13 MED ORDER — POTASSIUM CHLORIDE CRYS ER 20 MEQ PO TBCR
40.0000 meq | EXTENDED_RELEASE_TABLET | Freq: Once | ORAL | Status: AC
  Administered 2020-10-13: 40 meq via ORAL
  Filled 2020-10-13: qty 2

## 2020-10-13 MED ORDER — GLIPIZIDE 10 MG PO TABS
10.0000 mg | ORAL_TABLET | Freq: Two times a day (BID) | ORAL | Status: DC
  Administered 2020-10-13 – 2020-10-24 (×21): 10 mg via ORAL
  Filled 2020-10-13 (×25): qty 1

## 2020-10-13 MED ORDER — METOLAZONE 5 MG PO TABS
5.0000 mg | ORAL_TABLET | Freq: Once | ORAL | Status: AC
  Administered 2020-10-13: 5 mg via ORAL
  Filled 2020-10-13: qty 1

## 2020-10-13 MED ORDER — POTASSIUM CHLORIDE 20 MEQ PO PACK
20.0000 meq | PACK | ORAL | Status: AC
Start: 2020-10-13 — End: 2020-10-14
  Administered 2020-10-13 (×2): 20 meq
  Filled 2020-10-13 (×2): qty 1

## 2020-10-13 MED ORDER — SPIRONOLACTONE 25 MG PO TABS
25.0000 mg | ORAL_TABLET | Freq: Every day | ORAL | Status: DC
  Administered 2020-10-13 – 2020-10-27 (×15): 25 mg via ORAL
  Filled 2020-10-13 (×15): qty 1

## 2020-10-13 MED ORDER — GABAPENTIN 100 MG PO CAPS
100.0000 mg | ORAL_CAPSULE | Freq: Three times a day (TID) | ORAL | Status: DC
  Administered 2020-10-15 – 2020-10-27 (×28): 100 mg via ORAL
  Filled 2020-10-13 (×37): qty 1

## 2020-10-13 NOTE — Progress Notes (Signed)
      [address]       [place] [address]             [phone]      Resting comfortably Did not walk but was up in chair most of the day  BP 140/63   Pulse 86   Temp 97.7 F (36.5 C)   Resp (!) 26   Wt 119.8 kg   LMP  (LMP Unknown)   SpO2 92%   BMI 45.33 kg/m   Intake/Output Summary (Last 24 hours) at 10/13/2020 1814 Last data filed at 10/13/2020 1700 Gross per 24 hour  Intake 1026.29 ml  Output 989 ml  Net 37.29 ml   AHF added spironolactone and one time Zaroxolyn  Continue current Rx  [employer]. Roxan Hockey, MD [facility] [phone]

## 2020-10-13 NOTE — Plan of Care (Signed)
  Problem: Clinical Measurements: Goal: Respiratory complications will improve Outcome: Progressing Goal: Cardiovascular complication will be avoided Outcome: Progressing   Problem: Activity: Goal: Risk for activity intolerance will decrease Outcome: Progressing   Problem: Coping: Goal: Level of anxiety will decrease Outcome: Progressing   Problem: Elimination: Goal: Will not experience complications related to bowel motility Outcome: Progressing   Problem: Activity: Goal: Capacity to carry out activities will improve Outcome: Progressing   Problem: Cardiac: Goal: Ability to achieve and maintain adequate cardiopulmonary perfusion will improve Outcome: Progressing

## 2020-10-13 NOTE — Progress Notes (Signed)
4 Days Post-Op Procedure(s) (LRB): CORONARY ARTERY BYPASS GRAFTING (CABG), ON PUMP, TIMES FOUR, USING LEFT INTERNAL MAMMARY ARTERY AND LEFT RADIAL ARTERY (OPEN HARVEST) (N/A) RADIAL ARTERY HARVEST (Left) TRANSESOPHAGEAL ECHOCARDIOGRAM (TEE) (N/A) INDOCYANINE GREEN FLUORESCENCE IMAGING (ICG) (N/A) CLIPPING OF ATRIAL APPENDAGE USING ATRICURE 35MM CLIP (N/A) Subjective: Up in chair, denies pain, feels a little better  Objective: Vital signs in last 24 hours: Temp:  [97.7 F (36.5 C)-98.2 F (36.8 C)] 98 F (36.7 C) (03/05 0700) Pulse Rate:  [76-81] 81 (03/05 0500) Cardiac Rhythm: Normal sinus rhythm (03/05 0335) Resp:  [14-31] 25 (03/05 0700) BP: (125-168)/(62-90) 135/80 (03/05 0700) SpO2:  [89 %-100 %] 95 % (03/05 0700) Weight:  [119.8 kg] 119.8 kg (03/05 0419)  Hemodynamic parameters for last 24 hours:    Intake/Output from previous day: 03/04 0701 - 03/05 0700 In: 1357.6 [P.O.:540; I.V.:817.6] Out: 820 [Urine:820] Intake/Output this shift: Total I/O In: 240 [P.O.:240] Out: 37 [Urine:37]  General appearance: alert, cooperative and no distress Neurologic: intact Heart: regular rate and rhythm Lungs: diminished breath sounds bibasilar Abdomen: normal findings: soft, non-tender Wound: Preveena VAC in place  Lab Results: Recent Labs    10/12/20 0329 10/13/20 0329  WBC 14.2* 13.3*  HGB 8.7* 8.6*  HCT 28.4* 26.3*  PLT 281 298   BMET:  Recent Labs    10/12/20 0329 10/13/20 0329  NA 128* 128*  K 4.2 3.8  CL 95* 95*  CO2 22 23  GLUCOSE 234* 167*  BUN 48* 49*  CREATININE 2.08* 1.86*  CALCIUM 8.1* 8.0*    PT/INR: No results for input(s): LABPROT, INR in the last 72 hours. ABG    Component Value Date/Time   PHART 7.364 10/09/2020 2158   HCO3 23.9 10/09/2020 2158   TCO2 25 10/09/2020 2158   ACIDBASEDEF 1.0 10/09/2020 2158   O2SAT 70.5 10/13/2020 0329   CBG (last 3)  Recent Labs    10/12/20 1509 10/12/20 2149 10/13/20 0705  GLUCAP 178* 155* 168*     Assessment/Plan: S/P Procedure(s) (LRB): CORONARY ARTERY BYPASS GRAFTING (CABG), ON PUMP, TIMES FOUR, USING LEFT INTERNAL MAMMARY ARTERY AND LEFT RADIAL ARTERY (OPEN HARVEST) (N/A) RADIAL ARTERY HARVEST (Left) TRANSESOPHAGEAL ECHOCARDIOGRAM (TEE) (N/A) INDOCYANINE GREEN FLUORESCENCE IMAGING (ICG) (N/A) CLIPPING OF ATRIAL APPENDAGE USING ATRICURE 35MM CLIP (N/A) -POD # 4 CABG LAA clip Cv- in SR, BP mildly elevated  On low dose milrinone co-ox 70, continue as we try to diurese RESP- IS for atelectasis RENAL- creatinine slightly better, K= 3.8  Continue lasix drip ENDO- CBG moderately elevated  Continue Levemir, restart glucotrol Anemia secondary to ABL- stable GI- tolerating PO Continue to mobilize as tolerated   LOS: 11 days    Melrose Nakayama 10/13/2020

## 2020-10-13 NOTE — Progress Notes (Addendum)
Patient ID: Tiffany Le, female   DOB: 1976-03-06, 45 y.o.   MRN: [id number]     Advanced Heart Failure Rounding Note  PCP-Cardiologist: Quay Burow, MD   Subjective:    10/09/20 S/P CABG x4  Left Radial Harvest 3/2 Epi off. Given 40 mg IV lasix. Sluggish urine output. Had some low BPs and AKI   Lasix drip at 8 restarted yesterday. Off NE. Remains on milrinone 0.25. Co-ox back up to 71%  UOP remains sluggish. Weight up 5 pounds. CVP 15  Creatinine 2.08 -> 1.86 Na stable at 128   Sitting up in chair. Chest sore. Denies SOB, orthopnea or PND.   Echo 33/3 EF slightly better 40-45%. RV mildly reduced.   Objective:   Weight Range: 119.8 kg Body mass index is 45.33 kg/m.   Vital Signs:   Temp:  [97.6 F (36.4 C)-98.2 F (36.8 C)] 97.6 F (36.4 C) (03/05 1137) Pulse Rate:  [76-81] 81 (03/05 0811) Resp:  [14-31] 19 (03/05 0811) BP: (126-168)/(62-90) 151/76 (03/05 0811) SpO2:  [89 %-100 %] 93 % (03/05 0811) Weight:  [119.8 kg] 119.8 kg (03/05 0419) Last BM Date: 10/13/20  Weight change: Filed Weights   10/11/20 0636 10/12/20 0500 10/13/20 0419  Weight: 117.9 kg 117.8 kg 119.8 kg    Intake/Output:   Intake/Output Summary (Last 24 hours) at 10/13/2020 1139 Last data filed at 10/13/2020 0900 Gross per 24 hour  Intake 816.62 ml  Output 757 ml  Net 59.62 ml      Physical Exam   General:  Sitting up in bed  No resp difficulty HEENT: normal Neck: supple. RIJ TLC Carotids 2+ bilat; no bruits. No lymphadenopathy or thryomegaly appreciated. Cor: Sternal wound ok PMI nondisplaced. Regular rate & rhythm. No rubs, gallops or murmurs. Lungs: clear Abdomen: obese soft, nontender, nondistended. No hepatosplenomegaly. No bruits or masses. Good bowel sounds. Extremities: no cyanosis, clubbing, rash, 2+ edema Neuro: alert & orientedx3, cranial nerves grossly intact. moves all 4 extremities w/o difficulty. Affect pleasant   Telemetry   Sinus 80s Personally reviewed  Labs     CBC Recent Labs    10/12/20 0329 10/13/20 0329  WBC 14.2* 13.3*  HGB 8.7* 8.6*  HCT 28.4* 26.3*  MCV 87.1 85.7  PLT 281 643   Basic Metabolic Panel Recent Labs    10/10/20 1640 10/11/20 0414 10/12/20 0329 10/13/20 0329  NA 132*   < > 128* 128*  K 4.2   < > 4.2 3.8  CL 99   < > 95* 95*  CO2 22   < > 22 23  GLUCOSE 262*   < > 234* 167*  BUN 32*   < > 48* 49*  CREATININE 1.49*   < > 2.08* 1.86*  CALCIUM 8.4*   < > 8.1* 8.0*  MG 3.0*  --   --   --    < > = values in this interval not displayed.   Liver Function Tests No results for input(s): AST, ALT, ALKPHOS, BILITOT, PROT, ALBUMIN in the last 72 hours. No results for input(s): LIPASE, AMYLASE in the last 72 hours. Cardiac Enzymes No results for input(s): CKTOTAL, CKMB, CKMBINDEX, TROPONINI in the last 72 hours.  BNP: BNP (last 3 results) Recent Labs    10/02/20 0516  BNP 947.1*    ProBNP (last 3 results) No results for input(s): PROBNP in the last 8760 hours.   D-Dimer No results for input(s): DDIMER in the last 72 hours. Hemoglobin A1C No results for input(s):  HGBA1C in the last 72 hours. Fasting Lipid Panel No results for input(s): CHOL, HDL, LDLCALC, TRIG, CHOLHDL, LDLDIRECT in the last 72 hours. Thyroid Function Tests No results for input(s): TSH, T4TOTAL, T3FREE, THYROIDAB in the last 72 hours.  Invalid input(s): FREET3  Other results:   Imaging    No results found.   Medications:     Scheduled Medications: . acetaminophen  1,000 mg Oral Q6H   Or  . acetaminophen (TYLENOL) oral liquid 160 mg/5 mL  1,000 mg Per Tube Q6H  . amiodarone  400 mg Oral BID  . aspirin EC  81 mg Oral Daily  . atorvastatin  80 mg Oral Daily  . bisacodyl  10 mg Oral Daily  . Chlorhexidine Gluconate Cloth  6 each Topical Daily  . clopidogrel  75 mg Oral Daily  . colchicine  0.3 mg Oral BID  . docusate sodium  200 mg Oral Daily  . gabapentin  100 mg Oral TID  . glipiZIDE  10 mg Oral BID AC  . insulin aspart   0-24 Units Subcutaneous TID WC  . insulin detemir  18 Units Subcutaneous BID  . isosorbide dinitrate  5 mg Oral TID  . pantoprazole  40 mg Oral Daily  . potassium chloride  40 mEq Oral Once    Infusions: . sodium chloride    . sodium chloride    . sodium chloride 20 mL/hr at 10/09/20 1510  . furosemide (LASIX) 200 mg in dextrose 5% 100 mL (2mg /mL) infusion 8 mg/hr (10/13/20 1045)  . lactated ringers    . lactated ringers    . lactated ringers Stopped (10/12/20 0834)  . milrinone 0.25 mcg/kg/min (10/13/20 1050)  . norepinephrine (LEVOPHED) Adult infusion Stopped (10/12/20 0913)    PRN Medications: sodium chloride, dextrose, lactated ringers, lactulose, metoprolol tartrate, ondansetron (ZOFRAN) IV, oxyCODONE, sodium chloride flush, sodium chloride flush, traMADol   Assessment/Plan   1. CAD: S/P CABG x4 on 10/09/20 - Post surgical management per CT Surgery - No s/s angina - ASA 81 mg + Plavix 75 mg  - Atorvastatin 80 mg  2. Acute systolic CHF: Echo (8/85) with EF 35-40%, mild LVH (similar this admission and last).   - Ischemic CM now s/p CABG - Off NE. On milrinone 0.25. Co-ox 71% - Repeat echo 3/3 LVEF improved 40-45%. RV mildly reduced - Remains volume overloaded. Weight up about 15 pounds. Continue lasix gtt. Add metolazone 5 x 1 today - Struggling to take K supp. Will add spiro 25.  3. ID:  Concern for possible aspiration PNA.  CXR with bilateral infiltrates initially, now clearing with RUL infiltrate only (suspect she asymmetric edema or mucus plugging also). Now AF, WBCs downtrending. WBC 13.3k today Cultures NGTD.  - On cefepime prior to surgery.  - Encouraged pulmonary toilet 4. Acute hypoxemic respiratory failure: Intubated with pulmonary edema and possible PNA on admission. Extubated 2/23.   -Extubated after CABG 3/1 to 4 liters [place] -Continue IS 5. Type 2 diabetes: Control improved recently.  6. VT: Noted in ER though strips not available.  - stable on amio gtt,  continue while on inotropes   7. AKI -Creatinine trending up 1.5>1.9>>2.1> 1.86  - Suspect ATN from hypotension post CABG - Co-ox 71% on inotropes  - Continue milrinone - Push diuresis gently 8. Hyponatremia - Hypervolemic hyponatremia. Na stable at 128 - Fluid restrict - Diuresis w/ IV Lasix   Glori Bickers MD 10/13/2020 11:39 AM

## 2020-10-14 ENCOUNTER — Inpatient Hospital Stay (HOSPITAL_COMMUNITY): Payer: Medicaid Other

## 2020-10-14 LAB — CBC
HCT: 29 % — ABNORMAL LOW (ref 36.0–46.0)
Hemoglobin: 9.2 g/dL — ABNORMAL LOW (ref 12.0–15.0)
MCH: 27.1 pg (ref 26.0–34.0)
MCHC: 31.7 g/dL (ref 30.0–36.0)
MCV: 85.5 fL (ref 80.0–100.0)
Platelets: 361 10*3/uL (ref 150–400)
RBC: 3.39 MIL/uL — ABNORMAL LOW (ref 3.87–5.11)
RDW: 14.5 % (ref 11.5–15.5)
WBC: 15.1 10*3/uL — ABNORMAL HIGH (ref 4.0–10.5)
nRBC: 0 % (ref 0.0–0.2)

## 2020-10-14 LAB — BASIC METABOLIC PANEL
Anion gap: 11 (ref 5–15)
Anion gap: 10 (ref 5–15)
Anion gap: 8 (ref 5–15)
BUN: 52 mg/dL — ABNORMAL HIGH (ref 6–20)
BUN: 48 mg/dL — ABNORMAL HIGH (ref 6–20)
BUN: 46 mg/dL — ABNORMAL HIGH (ref 6–20)
CO2: 22 mmol/L (ref 22–32)
CO2: 23 mmol/L (ref 22–32)
CO2: 24 mmol/L (ref 22–32)
Calcium: 7.8 mg/dL — ABNORMAL LOW (ref 8.9–10.3)
Calcium: 7.8 mg/dL — ABNORMAL LOW (ref 8.9–10.3)
Calcium: 7.9 mg/dL — ABNORMAL LOW (ref 8.9–10.3)
Chloride: 97 mmol/L — ABNORMAL LOW (ref 98–111)
Chloride: 98 mmol/L (ref 98–111)
Chloride: 99 mmol/L (ref 98–111)
Creatinine, Ser: 1.35 mg/dL — ABNORMAL HIGH (ref 0.44–1.00)
Creatinine, Ser: 1.14 mg/dL — ABNORMAL HIGH (ref 0.44–1.00)
Creatinine, Ser: 1.24 mg/dL — ABNORMAL HIGH (ref 0.44–1.00)
GFR, Estimated: 50 mL/min — ABNORMAL LOW (ref >60)
GFR, Estimated: >60 mL/min (ref >60)
GFR, Estimated: 55 mL/min — ABNORMAL LOW (ref >60)
Glucose, Bld: 187 mg/dL — ABNORMAL HIGH (ref 70–99)
Glucose, Bld: 225 mg/dL — ABNORMAL HIGH (ref 70–99)
Glucose, Bld: 264 mg/dL — ABNORMAL HIGH (ref 70–99)
Potassium: 3.5 mmol/L (ref 3.5–5.1)
Potassium: 3.8 mmol/L (ref 3.5–5.1)
Potassium: 4.1 mmol/L (ref 3.5–5.1)
Sodium: 130 mmol/L — ABNORMAL LOW (ref 135–145)
Sodium: 131 mmol/L — ABNORMAL LOW (ref 135–145)
Sodium: 131 mmol/L — ABNORMAL LOW (ref 135–145)

## 2020-10-14 LAB — COOXEMETRY PANEL
Carboxyhemoglobin: 1.2 % (ref 0.5–1.5)
Carboxyhemoglobin: 1 % (ref 0.5–1.5)
Methemoglobin: 1.1 % (ref 0.0–1.5)
Methemoglobin: 0.7 % (ref 0.0–1.5)
O2 Saturation: 56.8 %
O2 Saturation: 60.2 %
Total hemoglobin: 9.3 g/dL — ABNORMAL LOW (ref 12.0–16.0)
Total hemoglobin: 12.6 g/dL (ref 12.0–16.0)

## 2020-10-14 LAB — GLUCOSE, CAPILLARY
Glucose-Capillary: 134 mg/dL — ABNORMAL HIGH (ref 70–99)
Glucose-Capillary: 164 mg/dL — ABNORMAL HIGH (ref 70–99)
Glucose-Capillary: 201 mg/dL — ABNORMAL HIGH (ref 70–99)
Glucose-Capillary: 220 mg/dL — ABNORMAL HIGH (ref 70–99)

## 2020-10-14 MED ORDER — ISOSORB DINITRATE-HYDRALAZINE 20-37.5 MG PO TABS: 0.5000 | ORAL_TABLET | Freq: Three times a day (TID) | ORAL | Status: DC

## 2020-10-14 MED ORDER — POTASSIUM CHLORIDE CRYS ER 20 MEQ PO TBCR
20.0000 meq | EXTENDED_RELEASE_TABLET | ORAL | Status: AC
  Administered 2020-10-14 (×3): 20 meq via ORAL
  Filled 2020-10-14 (×3): qty 1

## 2020-10-14 MED ORDER — ENOXAPARIN SODIUM 40 MG/0.4ML ~~LOC~~ SOLN
40.0000 mg | SUBCUTANEOUS | Status: DC
  Administered 2020-10-14 – 2020-10-26 (×12): 40 mg via SUBCUTANEOUS
  Filled 2020-10-14 (×13): qty 0.4

## 2020-10-14 MED ORDER — METOLAZONE 2.5 MG PO TABS
2.5000 mg | ORAL_TABLET | Freq: Once | ORAL | Status: AC
  Administered 2020-10-14: 2.5 mg via ORAL
  Filled 2020-10-14: qty 1

## 2020-10-14 MED ORDER — ISOSORB DINITRATE-HYDRALAZINE 20-37.5 MG PO TABS
0.5000 | ORAL_TABLET | Freq: Three times a day (TID) | ORAL | Status: DC
  Administered 2020-10-14 (×3): 0.5 via ORAL
  Filled 2020-10-14 (×3): qty 1

## 2020-10-14 MED ORDER — CARVEDILOL 6.25 MG PO TABS
6.2500 mg | ORAL_TABLET | Freq: Two times a day (BID) | ORAL | Status: DC
  Administered 2020-10-14 – 2020-10-18 (×8): 6.25 mg via ORAL
  Filled 2020-10-14 (×8): qty 1

## 2020-10-14 MED ORDER — AMLODIPINE BESYLATE 10 MG PO TABS
10.0000 mg | ORAL_TABLET | Freq: Every day | ORAL | Status: DC
  Administered 2020-10-14 – 2020-10-15 (×2): 10 mg via ORAL
  Filled 2020-10-14 (×2): qty 1

## 2020-10-14 NOTE — Plan of Care (Signed)
  Problem: Clinical Measurements: Goal: Diagnostic test results will improve Outcome: Progressing Goal: Respiratory complications will improve Outcome: Progressing Goal: Cardiovascular complication will be avoided Outcome: Progressing   Problem: Activity: Goal: Risk for activity intolerance will decrease Outcome: Progressing   Problem: Coping: Goal: Level of anxiety will decrease Outcome: Progressing   Problem: Elimination: Goal: Will not experience complications related to bowel motility Outcome: Progressing   Problem: Activity: Goal: Capacity to carry out activities will improve Outcome: Progressing   Problem: Cardiac: Goal: Ability to achieve and maintain adequate cardiopulmonary perfusion will improve Outcome: Progressing   Problem: Activity: Goal: Risk for activity intolerance will decrease Outcome: Progressing   Problem: Cardiac: Goal: Will achieve and/or maintain hemodynamic stability Outcome: Progressing   Problem: Clinical Measurements: Goal: Postoperative complications will be avoided or minimized Outcome: Progressing

## 2020-10-14 NOTE — Progress Notes (Signed)
Patient ID: [name], female   DOB: 01/16/1976, 45 y.o.   MRN: [id number]     Advanced Heart Failure Rounding Note  PCP-Cardiologist: Quay Burow, MD   Subjective:    10/09/20 S/P CABG x4  Left Radial Harvest 3/2 Epi off. Given 40 mg IV lasix. Sluggish urine output. Had some low BPs and AKI   Lasix gtt 8 mg/hr. Remains on milrinone 0.25. Co-ox 57%.  BP elevated.   UOP still sluggish though weight down.  Creatinine 2.08 -> 1.86 -> 1.35.  Na stable at 130.    Sitting up in chair. Chest sore. Has been walking in hall.    Echo 33/3 EF slightly better 40-45%. RV mildly reduced.   Objective:   Weight Range: 118.9 kg Body mass index is 44.99 kg/m.   Vital Signs:   Temp:  [97.6 F (36.4 C)-97.9 F (36.6 C)] 97.6 F (36.4 C) (03/06 0325) Pulse Rate:  [79-89] 82 (03/06 0700) Resp:  [5-28] 17 (03/06 0700) BP: (136-182)/(61-95) 172/88 (03/06 0700) SpO2:  [89 %-100 %] 92 % (03/06 0700) Weight:  [118.9 kg] 118.9 kg (03/06 0400) Last BM Date: 10/14/20  Weight change: Filed Weights   10/12/20 0500 10/13/20 0419 10/14/20 0400  Weight: 117.8 kg 119.8 kg 118.9 kg    Intake/Output:   Intake/Output Summary (Last 24 hours) at 10/14/2020 0747 Last data filed at 10/14/2020 0600 Gross per 24 hour  Intake 720.72 ml  Output 1329 ml  Net -608.28 ml      Physical Exam   General: NAD Neck: Thick, JVP 10 cm, no thyromegaly or thyroid nodule.  Lungs: Clear to auscultation bilaterally with normal respiratory effort. CV: Nondisplaced PMI.  Heart regular S1/S2, no S3/S4, no murmur. 1+ edema to knees. Abdomen: Soft, nontender, no hepatosplenomegaly, no distention.  Skin: Intact without lesions or rashes.  Neurologic: Alert and oriented x 3.  Psych: Normal affect. Extremities: No clubbing or cyanosis.  HEENT: Normal.    Telemetry   Sinus 80s Personally reviewed  Labs    CBC Recent Labs    10/13/20 0329 10/14/20 0334  WBC 13.3* 15.1*  HGB 8.6* 9.2*  HCT 26.3* 29.0*   MCV 85.7 85.5  PLT 298 678   Basic Metabolic Panel Recent Labs    10/13/20 1618 10/14/20 0334  NA 129* 130*  K 3.3* 3.5  CL 97* 97*  CO2 23 22  GLUCOSE 169* 187*  BUN 55* 52*  CREATININE 1.54* 1.35*  CALCIUM 8.0* 7.8*   Liver Function Tests No results for input(s): AST, ALT, ALKPHOS, BILITOT, PROT, ALBUMIN in the last 72 hours. No results for input(s): LIPASE, AMYLASE in the last 72 hours. Cardiac Enzymes No results for input(s): CKTOTAL, CKMB, CKMBINDEX, TROPONINI in the last 72 hours.  BNP: BNP (last 3 results) Recent Labs    10/02/20 0516  BNP 947.1*    ProBNP (last 3 results) No results for input(s): PROBNP in the last 8760 hours.   D-Dimer No results for input(s): DDIMER in the last 72 hours. Hemoglobin A1C No results for input(s): HGBA1C in the last 72 hours. Fasting Lipid Panel No results for input(s): CHOL, HDL, LDLCALC, TRIG, CHOLHDL, LDLDIRECT in the last 72 hours. Thyroid Function Tests No results for input(s): TSH, T4TOTAL, T3FREE, THYROIDAB in the last 72 hours.  Invalid input(s): FREET3  Other results:   Imaging    DG Chest Port 1 View  Result Date: 10/14/2020 CLINICAL DATA:  Status post CABG. EXAM: PORTABLE CHEST 1 VIEW COMPARISON:  October 12, 2020 FINDINGS: No pneumothorax. Stable right PICC line. The right IJ sheath is been removed. Opacity and possible associated small effusion on the left are stable. Increasing opacity in the right base. No other interval changes. IMPRESSION: 1. Support apparatus as above. 2. Increasing opacity in the right base could represent atelectasis or infiltrate. Recommend attention on follow-up. 3. Stable small effusion and opacity in the left base. 4. No other changes. Electronically Signed   By: Dorise Bullion III M.D   On: 10/14/2020 07:44     Medications:     Scheduled Medications: . acetaminophen  1,000 mg Oral Q6H   Or  . acetaminophen (TYLENOL) oral liquid 160 mg/5 mL  1,000 mg Per Tube Q6H  .  amiodarone  400 mg Oral BID  . aspirin EC  81 mg Oral Daily  . atorvastatin  80 mg Oral Daily  . bisacodyl  10 mg Oral Daily  . Chlorhexidine Gluconate Cloth  6 each Topical Daily  . clopidogrel  75 mg Oral Daily  . colchicine  0.3 mg Oral BID  . docusate sodium  200 mg Oral Daily  . gabapentin  100 mg Oral TID  . glipiZIDE  10 mg Oral BID AC  . insulin aspart  0-24 Units Subcutaneous TID WC  . insulin detemir  18 Units Subcutaneous BID  . isosorbide-hydrALAZINE  0.5 tablet Oral TID  . metolazone  2.5 mg Oral Once  . pantoprazole  40 mg Oral Daily  . potassium chloride  20 mEq Oral Q4H  . spironolactone  25 mg Oral Daily    Infusions: . sodium chloride    . sodium chloride    . sodium chloride 20 mL/hr at 10/09/20 1510  . furosemide (LASIX) 200 mg in dextrose 5% 100 mL (2mg /mL) infusion 8 mg/hr (10/14/20 0600)  . lactated ringers    . lactated ringers    . lactated ringers Stopped (10/12/20 0834)  . milrinone 0.25 mcg/kg/min (10/14/20 4098)  . norepinephrine (LEVOPHED) Adult infusion Stopped (10/12/20 0913)    PRN Medications: sodium chloride, dextrose, lactated ringers, lactulose, metoprolol tartrate, ondansetron (ZOFRAN) IV, oxyCODONE, sodium chloride flush, sodium chloride flush, traMADol   Assessment/Plan   1. CAD: S/P CABG x4 on 10/09/20. No chest pain.  - Post surgical management per CT Surgery - ASA 81 mg + Plavix 75 mg  - Atorvastatin 80 mg  2. Acute systolic CHF: Echo (1/19) with EF 35-40%, mild LVH (similar this admission and last).  Ischemic CM now s/p CABG. Repeat echo 3/3 LVEF improved 40-45%, RV mildly reduced. Co-ox 57% on milrinone 0.25. CVP 11-12 this morning on Lasix 8 mg/hr.  Better creatinine but still sluggish UOP and volume overloaded on exam.  - Will leave milrinone at 0.25 for now until better-diuresed.  - Increase Lasix to 12 mg/hr and will give metolazone 2.5 x 1.  Repeat BMET in pm.  - Continue spironolactone 25 mg daily.  - Stop isordil, start  Bidil 0.5 tab tid.  3. ID:  Concern for possible aspiration PNA.  CXR with bilateral infiltrates initially, now clearing with RUL infiltrate only (suspect she asymmetric edema or mucus plugging also). Now AF, WBCs downtrending. WBC 15k today. Cultures NGTD.  - On cefepime prior to surgery.  - Encouraged pulmonary toilet 4. Type 2 diabetes: Control improved recently.  5. VT: Noted in ER though strips not available.  - now on po amiodarone.    6. AKI: Creatinine down to 1.35, improved.   7. Hyponatremia: Na higher at 130.  Continue to walk halls.   Loralie Champagne MD 10/14/2020 7:47 AM

## 2020-10-14 NOTE — Progress Notes (Signed)
      [address]       [place] [address]             [phone]    POD # 5  Ambulated short distances several times today  BP 122/69   Pulse 84   Temp 97.6 F (36.4 C) (Oral)   Resp (!) 22   Wt 118.9 kg   LMP  (LMP Unknown)   SpO2 96%   BMI 44.99 kg/m   Intake/Output Summary (Last 24 hours) at 10/14/2020 1902 Last data filed at 10/14/2020 1800 Gross per 24 hour  Intake 1004.72 ml  Output 2645 ml  Net -1640.28 ml   Creatinine 1.14, K= 3.8  Diuresing much better today  Remo Lipps C. Roxan Hockey, MD [facility] [phone]

## 2020-10-14 NOTE — Progress Notes (Signed)
5 Days Post-Op Procedure(s) (LRB): CORONARY ARTERY BYPASS GRAFTING (CABG), ON PUMP, TIMES FOUR, USING LEFT INTERNAL MAMMARY ARTERY AND LEFT RADIAL ARTERY (OPEN HARVEST) (N/A) RADIAL ARTERY HARVEST (Left) TRANSESOPHAGEAL ECHOCARDIOGRAM (TEE) (N/A) INDOCYANINE GREEN FLUORESCENCE IMAGING (ICG) (N/A) CLIPPING OF ATRIAL APPENDAGE USING ATRICURE 35MM CLIP (N/A) Subjective: Up in chair, walked a "block" in unit without stopping this AM  Objective: Vital signs in last 24 hours: Temp:  [97.5 F (36.4 C)-97.9 F (36.6 C)] 97.5 F (36.4 C) (03/06 0808) Pulse Rate:  [79-89] 85 (03/06 0800) Cardiac Rhythm: Normal sinus rhythm (03/06 0800) Resp:  [5-28] 21 (03/06 0800) BP: (136-187)/(61-95) 187/89 (03/06 0800) SpO2:  [89 %-100 %] 97 % (03/06 0800) Weight:  [118.9 kg] 118.9 kg (03/06 0400)  Hemodynamic parameters for last 24 hours:    Intake/Output from previous day: 03/05 0701 - 03/06 0700 In: 960.7 [P.O.:600; I.V.:360.7] Out: 1329 [Urine:1329] Intake/Output this shift: Total I/O In: 144.8 [P.O.:120; I.V.:24.8] Out: 110 [Urine:110]  General appearance: alert, cooperative and no distress Neurologic: intact Heart: regular rate and rhythm Lungs: diminished breath sounds bibasilar Abdomen: normal findings: soft, non-tender  Lab Results: Recent Labs    10/13/20 0329 10/14/20 0334  WBC 13.3* 15.1*  HGB 8.6* 9.2*  HCT 26.3* 29.0*  PLT 298 361   BMET:  Recent Labs    10/13/20 1618 10/14/20 0334  NA 129* 130*  K 3.3* 3.5  CL 97* 97*  CO2 23 22  GLUCOSE 169* 187*  BUN 55* 52*  CREATININE 1.54* 1.35*  CALCIUM 8.0* 7.8*    PT/INR: No results for input(s): LABPROT, INR in the last 72 hours. ABG    Component Value Date/Time   PHART 7.364 10/09/2020 2158   HCO3 23.9 10/09/2020 2158   TCO2 25 10/09/2020 2158   ACIDBASEDEF 1.0 10/09/2020 2158   O2SAT 60.2 10/14/2020 0800   CBG (last 3)  Recent Labs    10/13/20 1524 10/13/20 2223 10/14/20 0549  GLUCAP 168* 149* 134*     Assessment/Plan: S/P Procedure(s) (LRB): CORONARY ARTERY BYPASS GRAFTING (CABG), ON PUMP, TIMES FOUR, USING LEFT INTERNAL MAMMARY ARTERY AND LEFT RADIAL ARTERY (OPEN HARVEST) (N/A) RADIAL ARTERY HARVEST (Left) TRANSESOPHAGEAL ECHOCARDIOGRAM (TEE) (N/A) INDOCYANINE GREEN FLUORESCENCE IMAGING (ICG) (N/A) CLIPPING OF ATRIAL APPENDAGE USING ATRICURE 35MM CLIP (N/A) -Overall looks good CV- co-ox 60 on milrinone 0.25  In SR  Hypertensive with SBP 180- will start Coreg and restart Norvasc RESP- opacity right base- continue IS, add flutter valve RENAL- creatinine improved but only minimally negative I/O  Agree with increase of Lasix drip ENDO- CBG control improved GI- tolerating PO SCD + ambulation for DVT prophylaxis, will add enoxaparin  LOS: 12 days    Melrose Nakayama 10/14/2020

## 2020-10-14 NOTE — Progress Notes (Signed)
Physical Therapy Treatment Patient Details Name: Tiffany Le MRN: [id number] DOB: Dec 24, 1975 Today's Date: 10/14/2020    History of Present Illness Pt is a 46 y.o. female recently admitted 2/14-2/17/22 with NSTEMI and LHC with severe CAD, recommended CABG vs. PCI, but left AMA, pt now readmitted 10/02/20 with respiratory distress; ETT 2/22-2/23. Workup for acute hypoxic respiratory failure likely secondary to acute cardiogenic pulmonary edema, possible HCAP, pleural effusion. S/p CABGx4 3/1. PMH includes HTN, DM2, CAD, HF.    PT Comments    Pt agreeable to therapy. Pt with limited endurance, but demonstrating improved sit>stand transfers and gait velocity. Pt with difficulty with bed mobility while maintaining sternal precautions. Pt able to recall sternal precautions. Pt continues to demonstrate deficits in balance, strength, coordination, gait and endurance and will benefit from skilled PT to address deficits to maximize independence with functional mobility prior to discharge.     Follow Up Recommendations  Home health PT;Supervision for mobility/OOB     Equipment Recommendations  Rolling walker with 5" wheels    Recommendations for Other Services       Precautions / Restrictions Precautions Precautions: Fall;Sternal Precaution Booklet Issued: No Precaution Comments: able to recall 3/3 precautions Restrictions Weight Bearing Restrictions: Yes RUE Weight Bearing: Weight bearing as tolerated LUE Weight Bearing: Weight bearing as tolerated Other Position/Activity Restrictions: sternal precautions    Mobility  Bed Mobility   Bed Mobility: Sit to Supine       Sit to supine: Max assist;HOB elevated   General bed mobility comments: pt requiring max A to return to bed due to fatigue and inability to bring legs into bed    Transfers Overall transfer level: Needs assistance Equipment used: Ambulation equipment used (EVA walker) Transfers: Sit to/from Stand Sit to Stand:  Min guard            Ambulation/Gait Ambulation/Gait assistance: Min Gaffer (Feet): 110 Feet Assistive device: None (Eva walker)       General Gait Details: initially slowed velocity with continued ambulation velocity improved. decreased step length B   Stairs             Wheelchair Mobility    Modified Rankin (Stroke Patients Only)       Balance Overall balance assessment: Needs assistance Sitting-balance support: Feet supported Sitting balance-Leahy Scale: Fair     Standing balance support: No upper extremity supported Standing balance-Leahy Scale: Poor Standing balance comment: static standing steadying assist wtih dynamic requires use of walker                            Cognition                                              Exercises      General Comments General comments (skin integrity, edema, etc.): VSS      Pertinent Vitals/Pain Pain Assessment: No/denies pain    Home Living                      Prior Function            PT Goals (current goals can now be found in the care plan section) Acute Rehab PT Goals Patient Stated Goal: I want to know what I'm allowed to do. PT Goal Formulation: With patient/family Time For  Goal Achievement: 10/19/20 Potential to Achieve Goals: Good Progress towards PT goals: Progressing toward goals    Frequency           PT Plan Current plan remains appropriate    Co-evaluation              AM-PAC PT "6 Clicks" Mobility   Outcome Measure  Help needed turning from your back to your side while in a flat bed without using bedrails?: A Little Help needed moving from lying on your back to sitting on the side of a flat bed without using bedrails?: A Lot Help needed moving to and from a bed to a chair (including a wheelchair)?: A Little Help needed standing up from a chair using your arms (e.g., wheelchair or bedside chair)?: A  Little Help needed to walk in hospital room?: A Little Help needed climbing 3-5 steps with a railing? : A Lot 6 Click Score: 16    End of Session Equipment Utilized During Treatment: Gait belt Activity Tolerance: Patient tolerated treatment well;Patient limited by fatigue Patient left: in bed;with call bell/phone within reach;with bed alarm set;with nursing/sitter in room Nurse Communication: Mobility status PT Visit Diagnosis: Other abnormalities of gait and mobility (R26.89);Muscle weakness (generalized) (M62.81)     Time: 8563-1497 PT Time Calculation (min) (ACUTE ONLY): 27 min  Charges:  $Gait Training: 8-22 mins $Therapeutic Activity: 8-22 mins                     Lyanne Co, DPT Acute Rehabilitation Services [phone]   [place] 10/14/2020, 10:29 AM

## 2020-10-15 ENCOUNTER — Inpatient Hospital Stay (HOSPITAL_COMMUNITY): Payer: Medicaid Other

## 2020-10-15 LAB — BASIC METABOLIC PANEL
Anion gap: 10 (ref 5–15)
BUN: 40 mg/dL — ABNORMAL HIGH (ref 6–20)
CO2: 26 mmol/L (ref 22–32)
Calcium: 8.1 mg/dL — ABNORMAL LOW (ref 8.9–10.3)
Chloride: 97 mmol/L — ABNORMAL LOW (ref 98–111)
Creatinine, Ser: 0.96 mg/dL (ref 0.44–1.00)
GFR, Estimated: >60 mL/min (ref >60)
Glucose, Bld: 200 mg/dL — ABNORMAL HIGH (ref 70–99)
Potassium: 4 mmol/L (ref 3.5–5.1)
Sodium: 133 mmol/L — ABNORMAL LOW (ref 135–145)

## 2020-10-15 LAB — GLUCOSE, CAPILLARY
Glucose-Capillary: 115 mg/dL — ABNORMAL HIGH (ref 70–99)
Glucose-Capillary: 166 mg/dL — ABNORMAL HIGH (ref 70–99)
Glucose-Capillary: 155 mg/dL — ABNORMAL HIGH (ref 70–99)
Glucose-Capillary: 193 mg/dL — ABNORMAL HIGH (ref 70–99)

## 2020-10-15 LAB — COMPREHENSIVE METABOLIC PANEL
ALT: 25 U/L (ref 0–44)
AST: 26 U/L (ref 15–41)
Albumin: 2.2 g/dL — ABNORMAL LOW (ref 3.5–5.0)
Alkaline Phosphatase: 59 U/L (ref 38–126)
Anion gap: 9 (ref 5–15)
BUN: 43 mg/dL — ABNORMAL HIGH (ref 6–20)
CO2: 26 mmol/L (ref 22–32)
Calcium: 8.3 mg/dL — ABNORMAL LOW (ref 8.9–10.3)
Chloride: 99 mmol/L (ref 98–111)
Creatinine, Ser: 1.06 mg/dL — ABNORMAL HIGH (ref 0.44–1.00)
GFR, Estimated: >60 mL/min (ref >60)
Glucose, Bld: 113 mg/dL — ABNORMAL HIGH (ref 70–99)
Potassium: 4 mmol/L (ref 3.5–5.1)
Sodium: 134 mmol/L — ABNORMAL LOW (ref 135–145)
Total Bilirubin: 0.4 mg/dL (ref 0.3–1.2)
Total Protein: 6.4 g/dL — ABNORMAL LOW (ref 6.5–8.1)

## 2020-10-15 LAB — COOXEMETRY PANEL
Carboxyhemoglobin: 1.1 % (ref 0.5–1.5)
Methemoglobin: 1 % (ref 0.0–1.5)
O2 Saturation: 73.5 %
Total hemoglobin: 9.1 g/dL — ABNORMAL LOW (ref 12.0–16.0)

## 2020-10-15 LAB — CBC
HCT: 29.4 % — ABNORMAL LOW (ref 36.0–46.0)
Hemoglobin: 9.3 g/dL — ABNORMAL LOW (ref 12.0–15.0)
MCH: 27.2 pg (ref 26.0–34.0)
MCHC: 31.6 g/dL (ref 30.0–36.0)
MCV: 86 fL (ref 80.0–100.0)
Platelets: 418 10*3/uL — ABNORMAL HIGH (ref 150–400)
RBC: 3.42 MIL/uL — ABNORMAL LOW (ref 3.87–5.11)
RDW: 14.5 % (ref 11.5–15.5)
WBC: 12.7 10*3/uL — ABNORMAL HIGH (ref 4.0–10.5)
nRBC: 0 % (ref 0.0–0.2)

## 2020-10-15 MED ORDER — GUAIFENESIN ER 600 MG PO TB12
600.0000 mg | ORAL_TABLET | Freq: Two times a day (BID) | ORAL | Status: AC
  Administered 2020-10-15 – 2020-10-17 (×5): 600 mg via ORAL
  Filled 2020-10-15 (×5): qty 1

## 2020-10-15 MED ORDER — ISOSORB DINITRATE-HYDRALAZINE 20-37.5 MG PO TABS
1.0000 | ORAL_TABLET | Freq: Three times a day (TID) | ORAL | Status: DC
  Administered 2020-10-15 – 2020-10-27 (×36): 1 via ORAL
  Filled 2020-10-15 (×37): qty 1

## 2020-10-15 NOTE — Progress Notes (Signed)
6 Days Post-Op Procedure(s) (LRB): CORONARY ARTERY BYPASS GRAFTING (CABG), ON PUMP, TIMES FOUR, USING LEFT INTERNAL MAMMARY ARTERY AND LEFT RADIAL ARTERY (OPEN HARVEST) (N/A) RADIAL ARTERY HARVEST (Left) TRANSESOPHAGEAL ECHOCARDIOGRAM (TEE) (N/A) INDOCYANINE GREEN FLUORESCENCE IMAGING (ICG) (N/A) CLIPPING OF ATRIAL APPENDAGE USING ATRICURE 35MM CLIP (N/A) Subjective: C/o chest "congestion"  Objective: Vital signs in last 24 hours: Temp:  [96.6 F (35.9 C)-98 F (36.7 C)] 97.9 F (36.6 C) (03/07 0700) Pulse Rate:  [81-95] 87 (03/07 0625) Cardiac Rhythm: Normal sinus rhythm (03/07 0400) Resp:  [14-28] 19 (03/07 0625) BP: (122-158)/(69-104) 142/79 (03/07 0500) SpO2:  [84 %-99 %] 84 % (03/07 0625) Weight:  [116.3 kg] 116.3 kg (03/07 0625)  Hemodynamic parameters for last 24 hours: CVP:  [5 mmHg-12 mmHg] 5 mmHg  Intake/Output from previous day: 03/06 0701 - 03/07 0700 In: 1040.5 [P.O.:700; I.V.:340.5] Out: 5295 [Urine:5295] Intake/Output this shift: No intake/output data recorded.  General appearance: alert and cooperative Neurologic: intact Heart: regular rate and rhythm, S1, S2 normal, no murmur, click, rub or gallop Lungs: clear to auscultation bilaterally Abdomen: soft, non-tender; bowel sounds normal; no masses,  no organomegaly Extremities: edema 2+ Wound: dressed, intact  Lab Results: Recent Labs    10/14/20 0334 10/15/20 0449  WBC 15.1* 12.7*  HGB 9.2* 9.3*  HCT 29.0* 29.4*  PLT 361 418*   BMET:  Recent Labs    10/14/20 1941 10/15/20 0449  NA 131* 134*  K 4.1 4.0  CL 99 99  CO2 24 26  GLUCOSE 264* 113*  BUN 46* 43*  CREATININE 1.24* 1.06*  CALCIUM 7.9* 8.3*    PT/INR: No results for input(s): LABPROT, INR in the last 72 hours. ABG    Component Value Date/Time   PHART 7.364 10/09/2020 2158   HCO3 23.9 10/09/2020 2158   TCO2 25 10/09/2020 2158   ACIDBASEDEF 1.0 10/09/2020 2158   O2SAT 73.5 10/15/2020 0449   CBG (last 3)  Recent Labs     10/14/20 1538 10/14/20 2250 10/15/20 0623  GLUCAP 201* 220* 115*    Assessment/Plan: S/P Procedure(s) (LRB): CORONARY ARTERY BYPASS GRAFTING (CABG), ON PUMP, TIMES FOUR, USING LEFT INTERNAL MAMMARY ARTERY AND LEFT RADIAL ARTERY (OPEN HARVEST) (N/A) RADIAL ARTERY HARVEST (Left) TRANSESOPHAGEAL ECHOCARDIOGRAM (TEE) (N/A) INDOCYANINE GREEN FLUORESCENCE IMAGING (ICG) (N/A) CLIPPING OF ATRIAL APPENDAGE USING ATRICURE 35MM CLIP (N/A) Mobilize Diuresis one more day in ICU   LOS: 13 days    [name] 10/15/2020
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
## 2020-10-15 NOTE — Plan of Care (Signed)
  Problem: Education: Goal: Knowledge of General Education information will improve Description: Including pain rating scale, medication(s)/side effects and non-pharmacologic comfort measures Outcome: Progressing   Problem: Clinical Measurements: Goal: Ability to maintain clinical measurements within normal limits will improve Outcome: Progressing   Problem: Clinical Measurements: Goal: Will remain free from infection Outcome: Progressing   Problem: Clinical Measurements: Goal: Cardiovascular complication will be avoided Outcome: Progressing   Problem: Activity: Goal: Capacity to carry out activities will improve Outcome: Progressing

## 2020-10-15 NOTE — TOC Progression Note (Signed)
Transition of Care [facility]) - Progression Note    Patient Details  Name: Tiffany [name] MRN: [id number] Date of Birth: 1976-08-08  [facility]) CM/SW Contact  Graves-Bigelow, Ocie Cornfield, RN Phone Number: 10/15/2020, 11:05 AM  Clinical Narrative: [place] should be able to follow the patient for charity home health services. Start of care to begin once stable to transition home. Pt will need orders for [facility] PT/OT once she gets closer to discharge. Case Manager will continue to follow for additional toc needs.     Expected Discharge Plan: [place] Barriers to Discharge: Continued Medical Work up  Expected Discharge Plan and Services Expected Discharge Plan: [place] In-house Referral: NA Discharge Planning Services: CM Consult Post Acute Care Choice: [place] arrangements for the past 2 months: Apartment                  HH Arranged: PT,OT [place]: [place] ([facility]) Date HH Agency Contacted: 10/10/20 Time [place]: 1400 Representative spoke with at [place]: [place] Determinants of Health ([place]) Interventions Financial Strain Interventions: Other (Comment) (pt. reports she would like to file for disability, and has no health insurance, and she has to pay out of pocket for all her meds, and doesn't have food stamps and has to walk up steps to her apartment with no elevator) Housing Interventions: Intervention Not Indicated Stress Interventions: Intervention Not Indicated Transportation Interventions: [facility]  Readmission Risk Interventions Readmission Risk Prevention Plan 10/11/2020  Transportation Screening Complete  HRI or [place] Complete  Social Work Consult for [place] Planning/Counseling Complete  Palliative Care Screening Not Applicable  Medication Review Press photographer) Complete  Some recent data might be  hidden

## 2020-10-15 NOTE — Progress Notes (Signed)
Patient ID: Tiffany [name], female   DOB: 12/02/1975, 45 y.o.   MRN: [id number]     Advanced Heart Failure Rounding Note  PCP-Cardiologist: Quay Burow, MD   Subjective:    10/09/20 S/P CABG x4  Left Radial Harvest  Lasix gtt 12 mg/hr. Remains on milrinone 0.25. Co-ox 74%.    Better diuresis with creatinine trending down.  CVP 9.    Echo post-op EF slightly better 40-45%. RV mildly reduced.   Objective:   Weight Range: 116.3 kg Body mass index is 44.01 kg/m.   Vital Signs:   Temp:  [96.6 F (35.9 C)-98 F (36.7 C)] 97.9 F (36.6 C) (03/07 0700) Pulse Rate:  [81-95] 87 (03/07 0625) Resp:  [14-28] 19 (03/07 0625) BP: (122-158)/(69-104) 142/79 (03/07 0500) SpO2:  [84 %-99 %] 84 % (03/07 0625) Weight:  [116.3 kg] 116.3 kg (03/07 0625) Last BM Date: 10/14/20  Weight change: Filed Weights   10/13/20 0419 10/14/20 0400 10/15/20 0625  Weight: 119.8 kg 118.9 kg 116.3 kg    Intake/Output:   Intake/Output Summary (Last 24 hours) at 10/15/2020 0925 Last data filed at 10/15/2020 9417 Gross per 24 hour  Intake 761.44 ml  Output 5075 ml  Net -4313.56 ml      Physical Exam   General: NAD Neck: Thick, JVP 8-9 cm, no thyromegaly or thyroid nodule.  Lungs: Clear to auscultation bilaterally with normal respiratory effort. CV: Nondisplaced PMI.  Heart regular S1/S2, no S3/S4, no murmur.  Trace ankle edema.  Abdomen: Soft, nontender, no hepatosplenomegaly, no distention.  Skin: Intact without lesions or rashes.  Neurologic: Alert and oriented x 3.  Psych: Normal affect. Extremities: No clubbing or cyanosis.  HEENT: Normal. ]   Telemetry   Sinus 80s Personally reviewed  Labs    CBC Recent Labs    10/14/20 0334 10/15/20 0449  WBC 15.1* 12.7*  HGB 9.2* 9.3*  HCT 29.0* 29.4*  MCV 85.5 86.0  PLT 361 408*   Basic Metabolic Panel Recent Labs    10/14/20 1941 10/15/20 0449  NA 131* 134*  K 4.1 4.0  CL 99 99  CO2 24 26  GLUCOSE 264* 113*  BUN 46* 43*   CREATININE 1.24* 1.06*  CALCIUM 7.9* 8.3*   Liver Function Tests Recent Labs    10/15/20 0449  AST 26  ALT 25  ALKPHOS 59  BILITOT 0.4  PROT 6.4*  ALBUMIN 2.2*   No results for input(s): LIPASE, AMYLASE in the last 72 hours. Cardiac Enzymes No results for input(s): CKTOTAL, CKMB, CKMBINDEX, TROPONINI in the last 72 hours.  BNP: BNP (last 3 results) Recent Labs    10/02/20 0516  BNP 947.1*    ProBNP (last 3 results) No results for input(s): PROBNP in the last 8760 hours.   D-Dimer No results for input(s): DDIMER in the last 72 hours. Hemoglobin A1C No results for input(s): HGBA1C in the last 72 hours. Fasting Lipid Panel No results for input(s): CHOL, HDL, LDLCALC, TRIG, CHOLHDL, LDLDIRECT in the last 72 hours. Thyroid Function Tests No results for input(s): TSH, T4TOTAL, T3FREE, THYROIDAB in the last 72 hours.  Invalid input(s): FREET3  Other results:   Imaging    DG Chest Port 1 View  Result Date: 10/15/2020 CLINICAL DATA:  Chest pain following open heart surgery EXAM: PORTABLE CHEST 1 VIEW COMPARISON:  10/14/2020 FINDINGS: Lung volumes are small with resultant vascular crowding at the hila. Moderate left pleural effusion is present. Trace perihilar pulmonary edema is suspected. No pneumothorax. Right upper  extremity PICC line is seen with its tip within the superior vena cava. Coronary artery bypass grafting has been performed. Cardiac size is mildly enlarged. IMPRESSION: Pulmonary hypoinflation. Trace perihilar pulmonary edema.  Moderate left pleural effusion. Electronically Signed   By: Fidela Salisbury MD   On: 10/15/2020 06:25     Medications:     Scheduled Medications: . amiodarone  400 mg Oral BID  . amLODipine  10 mg Oral Daily  . aspirin EC  81 mg Oral Daily  . atorvastatin  80 mg Oral Daily  . bisacodyl  10 mg Oral Daily  . carvedilol  6.25 mg Oral BID WC  . Chlorhexidine Gluconate Cloth  6 each Topical Daily  . clopidogrel  75 mg Oral Daily   . colchicine  0.3 mg Oral BID  . docusate sodium  200 mg Oral Daily  . enoxaparin (LOVENOX) injection  40 mg Subcutaneous Q24H  . gabapentin  100 mg Oral TID  . glipiZIDE  10 mg Oral BID AC  . guaiFENesin  600 mg Oral BID  . insulin aspart  0-24 Units Subcutaneous TID WC  . insulin detemir  18 Units Subcutaneous BID  . isosorbide-hydrALAZINE  0.5 tablet Oral TID  . pantoprazole  40 mg Oral Daily  . spironolactone  25 mg Oral Daily    Infusions: . sodium chloride    . sodium chloride    . sodium chloride 20 mL/hr at 10/09/20 1510  . furosemide (LASIX) 200 mg in dextrose 5% 100 mL (2mg /mL) infusion 12 mg/hr (10/15/20 0600)  . lactated ringers    . lactated ringers    . lactated ringers Stopped (10/12/20 0834)  . milrinone 0.25 mcg/kg/min (10/15/20 0555)  . norepinephrine (LEVOPHED) Adult infusion Stopped (10/12/20 0913)    PRN Medications: sodium chloride, dextrose, lactated ringers, lactulose, metoprolol tartrate, ondansetron (ZOFRAN) IV, oxyCODONE, sodium chloride flush, sodium chloride flush, traMADol   Assessment/Plan   1. CAD: S/P CABG x4 on 10/09/20. No chest pain.  - Post surgical management per CT Surgery - ASA 81 mg + Plavix 75 mg  - Atorvastatin 80 mg  2. Acute systolic CHF: Echo (1/70) with EF 35-40%, mild LVH (similar this admission and last).  Ischemic CM now s/p CABG. Repeat echo 3/3 LVEF improved 40-45%, RV mildly reduced. Co-ox 74% on milrinone 0.25. CVP 9 this morning on Lasix 12 mg/hr.  Creatinine and UOP much better, diuresed well yesterday.  - Decrease milrinone to 0.125 now, likely can stop this evening.  - Continue diuresis but will decrease Lasix gtt to 10 mg/hr today.  Probably to po tomorrow.  - Continue spironolactone 25 mg daily.  - Increase bidil to 1 tab tid.  Delene Loll to start next if creatinine/BP remain stable.   3. ID:  Concern for possible aspiration PNA.  CXR with bilateral infiltrates initially, now clearing with RUL infiltrate only  (suspect she asymmetric edema or mucus plugging also). Afebrile. Cultures NGTD.  - On cefepime prior to surgery.  - Encouraged pulmonary toilet 4. Type 2 diabetes: Control improved recently.  5. VT: Noted in ER though strips not available.  - now on po amiodarone, will continue 1 month post-op then stop.    6. AKI: Creatinine down to 1.06, improved.   7. Hyponatremia: Resolving.   Continue to walk halls.   Loralie Champagne MD 10/15/2020 9:25 AM

## 2020-10-15 NOTE — Plan of Care (Signed)
  Problem: Education: Goal: Knowledge of General Education information will improve Description: Including pain rating scale, medication(s)/side effects and non-pharmacologic comfort measures Outcome: Progressing   Problem: Health Behavior/Discharge Planning: Goal: Ability to manage health-related needs will improve Outcome: Progressing   Problem: Clinical Measurements: Goal: Ability to maintain clinical measurements within normal limits will improve Outcome: Progressing Goal: Will remain free from infection Outcome: Progressing Goal: Diagnostic test results will improve Outcome: Progressing Goal: Respiratory complications will improve Outcome: Progressing Goal: Cardiovascular complication will be avoided Outcome: Progressing   Problem: Activity: Goal: Risk for activity intolerance will decrease Outcome: Progressing   Problem: Coping: Goal: Level of anxiety will decrease Outcome: Progressing   Problem: Elimination: Goal: Will not experience complications related to bowel motility Outcome: Progressing Goal: Will not experience complications related to urinary retention Outcome: Progressing   Problem: Education: Goal: Ability to demonstrate management of disease process will improve Outcome: Progressing Goal: Ability to verbalize understanding of medication therapies will improve Outcome: Progressing Goal: Individualized Educational Video(s) Outcome: Progressing   Problem: Activity: Goal: Capacity to carry out activities will improve Outcome: Progressing   Problem: Cardiac: Goal: Ability to achieve and maintain adequate cardiopulmonary perfusion will improve Outcome: Progressing   Problem: Education: Goal: Will demonstrate proper wound care and an understanding of methods to prevent future damage Outcome: Progressing Goal: Knowledge of disease or condition will improve Outcome: Progressing Goal: Knowledge of the prescribed therapeutic regimen will  improve Outcome: Progressing Goal: Individualized Educational Video(s) Outcome: Progressing   Problem: Activity: Goal: Risk for activity intolerance will decrease Outcome: Progressing   Problem: Cardiac: Goal: Will achieve and/or maintain hemodynamic stability Outcome: Progressing   Problem: Clinical Measurements: Goal: Postoperative complications will be avoided or minimized Outcome: Progressing   Problem: Respiratory: Goal: Respiratory status will improve Outcome: Progressing   Problem: Skin Integrity: Goal: Wound healing without signs and symptoms of infection Outcome: Progressing Goal: Risk for impaired skin integrity will decrease Outcome: Progressing   Problem: Urinary Elimination: Goal: Ability to achieve and maintain adequate renal perfusion and functioning will improve Outcome: Progressing

## 2020-10-15 NOTE — Progress Notes (Signed)
Occupational Therapy Treatment Patient Details Name: Tiffany [name] MRN: [id number] DOB: January 31, 1976 Today's Date: 10/15/2020    History of present illness Pt is a 44 y.o. female recently admitted 2/14-2/17/22 with NSTEMI and LHC with severe CAD, recommended CABG vs. PCI, but left AMA, pt now readmitted 10/02/20 with respiratory distress; ETT 2/22-2/23. Pt with acute hypoxic respiratory failure likely secondary to acute cardiogenic pulmonary edema. S/p CABGx4 3/1. PMH includes HTN, DM2, CAD, HF.   OT comments  Focus of session on educating pt in sternal precautions related to ADL, IADL and mobility. Educated verbally in energy conservation strategies, uses of 3 in 1 and availability of AE for LB ADL. Pt plans to go stay with her mother upon discharge [name] she has assistance for those activities she is unable to perform.   Follow Up Recommendations  Home health OT    Equipment Recommendations  3 in 1 bedside commode    Recommendations for Other Services      Precautions / Restrictions Precautions Precautions: Fall;Sternal Precaution Comments: educated in sternal precautions related to ADL and IADL Restrictions Weight Bearing Restrictions: No       Mobility Bed Mobility Overal bed mobility: Needs Assistance Bed Mobility: Supine to Sit;Sit to Supine     Supine to sit: HOB elevated;Mod assist Sit to supine: Mod assist   General bed mobility comments: assist for trunk to EOB and LEs back into bed    Transfers Overall transfer level: Needs assistance Equipment used: 1 person hand held assist Transfers: Stand Pivot Transfers;Sit to/from Stand Sit to Stand: Min assist Stand pivot transfers: Min assist       General transfer comment: use of momentum, hands on knees    Balance Overall balance assessment: Needs assistance Sitting-balance support: No upper extremity supported Sitting balance-Leahy Scale: Fair     Standing balance support: Bilateral upper extremity  supported;No upper extremity supported Standing balance-Leahy Scale: Fair Standing balance comment: fair static standing                           ADL either performed or assessed with clinical judgement   ADL Overall ADL's : Needs assistance/impaired Eating/Feeding: Set up;Bed level Eating/Feeding Details (indicate cue type and reason): assisted to open containers Grooming: Wash/dry hands;Sitting;Min guard         Lower Body Bathing Details (indicate cue type and reason): recommended long handled bath sponge, reacher       Lower Body Dressing Details (indicate cue type and reason): pt unable to cross foot over opposite knee, verbally educated in possibility of using AE vs relying on mother               General ADL Comments: Educated pt in multiple uses of 3 in 1, energy conservation strategies and IADL to avoid due to sternal precautions.     Vision       Perception     Praxis      Cognition Arousal/Alertness: Awake/alert Behavior During Therapy: WFL for tasks assessed/performed Overall Cognitive Status: Within Functional Limits for tasks assessed                                          Exercises    Shoulder Instructions       General Comments      Pertinent Vitals/ Pain       Pain  Assessment: 0-10 Pain Score: 3  Pain Location: chest/incision Pain Descriptors / Indicators: Sore Pain Intervention(s): Monitored during session;Repositioned;Premedicated before session  Home Living                                          Prior Functioning/Environment              Frequency  Min 2X/week        Progress Toward Goals  OT Goals(current goals can now be found in the care plan section)  Progress towards OT goals: Progressing toward goals  Acute Rehab OT Goals Patient Stated Goal: I want to know what I'm allowed to do. OT Goal Formulation: With patient Time For Goal Achievement:  10/20/20 Potential to Achieve Goals: Good  Plan Discharge plan remains appropriate    Co-evaluation                 AM-PAC OT "6 Clicks" Daily Activity     Outcome Measure   Help from another person eating meals?: None Help from another person taking care of personal grooming?: A Little Help from another person toileting, which includes using toliet, bedpan, or urinal?: A Lot Help from another person bathing (including washing, rinsing, drying)?: A Lot Help from another person to put on and taking off regular upper body clothing?: A Little Help from another person to put on and taking off regular lower body clothing?: A Lot 6 Click Score: 16    End of Session    OT Visit Diagnosis: Unsteadiness on feet (R26.81);Muscle weakness (generalized) (M62.81);Dizziness and giddiness (R42)   Activity Tolerance Patient tolerated treatment well   Patient Left in bed;with call bell/phone within reach   Nurse Communication          Time: 3818-2993 OT Time Calculation (min): 18 min  Charges: OT General Charges $OT Visit: 1 Visit OT Treatments $Self Care/Home Management : 8-22 mins  Tiffany [name], OTR/L Acute Rehabilitation Services Pager: [phone] Office: [phone]   Tiffany [name] 10/15/2020, 11:55 AM

## 2020-10-15 NOTE — Plan of Care (Signed)
  Problem: Clinical Measurements: Goal: Postoperative complications will be avoided or minimized Outcome: Progressing   Problem: Activity: Goal: Risk for activity intolerance will decrease Outcome: Progressing   Problem: Education: Goal: Ability to demonstrate management of disease process will improve Outcome: Progressing   Problem: Elimination: Goal: Will not experience complications related to urinary retention Outcome: Progressing   Problem: Clinical Measurements: Goal: Ability to maintain clinical measurements within normal limits will improve Outcome: Progressing Goal: Will remain free from infection Outcome: Progressing Goal: Respiratory complications will improve Outcome: Progressing

## 2020-10-15 NOTE — TOC Progression Note (Signed)
Transition of Care (TOC) - Progression Note Heart Failure   Patient Details  Name: Tiffany [name] MRN: [id number] Date of Birth: Dec 26, 1975  Transition of Care [facility]) CM/SW [place], [place] Phone Number: 10/15/2020, 4:17 PM  Clinical Narrative:    CSW completed SDOH with Ms. Lyman who identified several needs. CSW sent referral to the [facility] for help with a food stamp application. CSW provided Ms. Foulk and her daughter with paperwork for the CAFA application and to bring back completed application with her upon discharge and follow up appointment at the [facility] outpatient clinic. CSW and Ms. Brunke discussed her financial concerns and her wish to file for disability. Although upon further review of her health condition and discussion with the outpatient HV social worker it seems Ms. Elbaum may not qualify for disability at this time.   Expected Discharge Plan: [place] Barriers to Discharge: Continued Medical Work up  Expected Discharge Plan and Services Expected Discharge Plan: [place] In-house Referral: NA Discharge Planning Services: CM Consult Post Acute Care Choice: [place] arrangements for the past 2 months: Apartment                           HH Arranged: PT,OT [place]: [place] ([facility]) Date HH Agency Contacted: 10/10/20 Time [place]: 1400 Representative spoke with at [place]: [place] Determinants of Health ([place]) Interventions Financial Strain Interventions: Other (Comment) (pt. reports she would like to file for disability, and has no health insurance, and she has to pay out of pocket for all her meds, and doesn't have food stamps and has to walk up steps to her apartment with no elevator) Housing Interventions: Intervention Not Indicated Stress Interventions: Intervention Not Indicated Transportation Interventions: [facility]  Services  Readmission Risk Interventions Readmission Risk Prevention Plan 10/11/2020  Transportation Screening Complete  HRI or [place] Complete  Social Work Consult for [place] Planning/Counseling Complete  Palliative Care Screening Not Applicable  Medication Review Press photographer) Complete  Some recent data might be hidden   [place], MSW, LCSWA [phone] Heart Failure Social Worker

## 2020-10-16 ENCOUNTER — Inpatient Hospital Stay (HOSPITAL_COMMUNITY): Payer: Medicaid Other

## 2020-10-16 HISTORY — PX: IR THORACENTESIS ASP PLEURAL SPACE W/IMG GUIDE: IMG5380

## 2020-10-16 LAB — GLUCOSE, CAPILLARY
Glucose-Capillary: 96 mg/dL (ref 70–99)
Glucose-Capillary: 97 mg/dL (ref 70–99)
Glucose-Capillary: 180 mg/dL — ABNORMAL HIGH (ref 70–99)
Glucose-Capillary: 196 mg/dL — ABNORMAL HIGH (ref 70–99)

## 2020-10-16 LAB — CBC
HCT: 27.7 % — ABNORMAL LOW (ref 36.0–46.0)
Hemoglobin: 8.9 g/dL — ABNORMAL LOW (ref 12.0–15.0)
MCH: 27.3 pg (ref 26.0–34.0)
MCHC: 32.1 g/dL (ref 30.0–36.0)
MCV: 85 fL (ref 80.0–100.0)
Platelets: 446 10*3/uL — ABNORMAL HIGH (ref 150–400)
RBC: 3.26 MIL/uL — ABNORMAL LOW (ref 3.87–5.11)
RDW: 14.4 % (ref 11.5–15.5)
WBC: 12.8 10*3/uL — ABNORMAL HIGH (ref 4.0–10.5)
nRBC: 0 % (ref 0.0–0.2)

## 2020-10-16 LAB — COOXEMETRY PANEL
Carboxyhemoglobin: 1.3 % (ref 0.5–1.5)
Methemoglobin: 1.2 % (ref 0.0–1.5)
O2 Saturation: 65.5 %
Total hemoglobin: 7.5 g/dL — ABNORMAL LOW (ref 12.0–16.0)

## 2020-10-16 MED ORDER — AMLODIPINE BESYLATE 5 MG PO TABS
5.0000 mg | ORAL_TABLET | Freq: Every day | ORAL | Status: DC
  Administered 2020-10-16 – 2020-10-18 (×3): 5 mg via ORAL
  Filled 2020-10-16 (×3): qty 1

## 2020-10-16 MED ORDER — LIDOCAINE HCL 1 % IJ SOLN
INTRAMUSCULAR | Status: AC
  Filled 2020-10-16: qty 20

## 2020-10-16 MED ORDER — FUROSEMIDE 20 MG PO TABS: 20.0000 mg | ORAL_TABLET | Freq: Every day | ORAL | Status: DC

## 2020-10-16 MED ORDER — SACUBITRIL-VALSARTAN 24-26 MG PO TABS
1.0000 | ORAL_TABLET | Freq: Two times a day (BID) | ORAL | Status: DC
  Administered 2020-10-16 – 2020-10-18 (×6): 1 via ORAL
  Filled 2020-10-16 (×7): qty 1

## 2020-10-16 MED ORDER — LIDOCAINE HCL (PF) 1 % IJ SOLN
INTRAMUSCULAR | Status: AC | PRN
  Administered 2020-10-16: 10 mL

## 2020-10-16 MED ORDER — FUROSEMIDE 40 MG PO TABS
40.0000 mg | ORAL_TABLET | Freq: Every day | ORAL | Status: DC
  Administered 2020-10-16: 40 mg via ORAL
  Filled 2020-10-16: qty 1

## 2020-10-16 NOTE — Progress Notes (Signed)
Patient back to unit.

## 2020-10-16 NOTE — Procedures (Signed)
PROCEDURE SUMMARY:  Successful image-guided left thoracentesis. Yielded 100 milliliters of light red fluid. Patient tolerated procedure well. No immediate complications. EBL = 0 mL.  Specimen was not sent for labs. CXR ordered.  Please see imaging section of Epic for full dictation.   Earley Abide PA-C 10/16/2020 12:47 PM

## 2020-10-16 NOTE — Progress Notes (Addendum)
Patient ID: Tiffany Le, female   DOB: 02-04-1976, 45 y.o.   MRN: [id number]     Advanced Heart Failure Rounding Note  PCP-Cardiologist: Tiffany Burow, MD   Subjective:    10/09/20 S/P CABG x4  Left Radial Harvest  Lasix gtt 10 mg/hr. Remains on milrinone 0.125. Co-ox 66%.    -4.2L in UOP yesterday. Wt down an additional 8 lb. CVP 6-7   SCr 0.96. K 4.0   OOB sitting up in chair. Ambulated this am. Did ok. No complaints.  Echo post-op EF slightly better 40-45%. RV mildly reduced.   Objective:   Weight Range: 112.8 kg Body mass index is 42.69 kg/m.   Vital Signs:   Temp:  [98.2 F (36.8 C)-98.4 F (36.9 C)] 98.4 F (36.9 C) (03/08 0354) Pulse Rate:  [84-91] 89 (03/08 0700) Resp:  [15-27] 16 (03/08 0700) BP: (99-155)/(53-102) 151/102 (03/08 0700) SpO2:  [94 %-100 %] 99 % (03/08 0700) Weight:  [112.8 kg] 112.8 kg (03/08 0500) Last BM Date: 10/15/20  Weight change: Filed Weights   10/14/20 0400 10/15/20 0625 10/16/20 0500  Weight: 118.9 kg 116.3 kg 112.8 kg    Intake/Output:   Intake/Output Summary (Last 24 hours) at 10/16/2020 0714 Last data filed at 10/16/2020 3536 Gross per 24 hour  Intake 809.11 ml  Output 4225 ml  Net -3415.89 ml      Physical Exam   CVP 6-7  General:  Well appearing, OOB sitting up in chair. No respiratory difficulty HEENT: normal Neck: supple.  JVD 7 cm. Carotids 2+ bilat; no bruits. No lymphadenopathy or thyromegaly appreciated. Cor: PMI nondisplaced. Regular rate & rhythm. No rubs, gallops or murmurs. + Sternal dressing/ wound vac  Lungs: decreased BS at the bases bilaterally  Abdomen: soft, nontender, nondistended. No hepatosplenomegaly. No bruits or masses. Good bowel sounds. Extremities: no cyanosis, clubbing, rash, edema Neuro: alert & oriented x 3, cranial nerves grossly intact. moves all 4 extremities w/o difficulty. Affect pleasant.     Telemetry   Sinus 80s Personally reviewed  Labs    CBC Recent Labs     10/15/20 0449 10/16/20 0458  WBC 12.7* 12.8*  HGB 9.3* 8.9*  HCT 29.4* 27.7*  MCV 86.0 85.0  PLT 418* 144*   Basic Metabolic Panel Recent Labs    10/15/20 0449 10/15/20 1416  NA 134* 133*  K 4.0 4.0  CL 99 97*  CO2 26 26  GLUCOSE 113* 200*  BUN 43* 40*  CREATININE 1.06* 0.96  CALCIUM 8.3* 8.1*   Liver Function Tests Recent Labs    10/15/20 0449  AST 26  ALT 25  ALKPHOS 59  BILITOT 0.4  PROT 6.4*  ALBUMIN 2.2*   No results for input(s): LIPASE, AMYLASE in the last 72 hours. Cardiac Enzymes No results for input(s): CKTOTAL, CKMB, CKMBINDEX, TROPONINI in the last 72 hours.  BNP: BNP (last 3 results) Recent Labs    10/02/20 0516  BNP 947.1*    ProBNP (last 3 results) No results for input(s): PROBNP in the last 8760 hours.   D-Dimer No results for input(s): DDIMER in the last 72 hours. Hemoglobin A1C No results for input(s): HGBA1C in the last 72 hours. Fasting Lipid Panel No results for input(s): CHOL, HDL, LDLCALC, TRIG, CHOLHDL, LDLDIRECT in the last 72 hours. Thyroid Function Tests No results for input(s): TSH, T4TOTAL, T3FREE, THYROIDAB in the last 72 hours.  Invalid input(s): FREET3  Other results:   Imaging    No results found.   Medications:  Scheduled Medications: . amiodarone  400 mg Oral BID  . amLODipine  10 mg Oral Daily  . aspirin EC  81 mg Oral Daily  . atorvastatin  80 mg Oral Daily  . bisacodyl  10 mg Oral Daily  . carvedilol  6.25 mg Oral BID WC  . Chlorhexidine Gluconate Cloth  6 each Topical Daily  . clopidogrel  75 mg Oral Daily  . colchicine  0.3 mg Oral BID  . docusate sodium  200 mg Oral Daily  . enoxaparin (LOVENOX) injection  40 mg Subcutaneous Q24H  . gabapentin  100 mg Oral TID  . glipiZIDE  10 mg Oral BID AC  . guaiFENesin  600 mg Oral BID  . insulin aspart  0-24 Units Subcutaneous TID WC  . insulin detemir  18 Units Subcutaneous BID  . isosorbide-hydrALAZINE  1 tablet Oral TID  . pantoprazole  40 mg  Oral Daily  . spironolactone  25 mg Oral Daily    Infusions: . sodium chloride    . sodium chloride    . sodium chloride 20 mL/hr at 10/09/20 1510  . furosemide (LASIX) 200 mg in dextrose 5% 100 mL (2mg /mL) infusion 10 mg/hr (10/16/20 0642)  . lactated ringers    . lactated ringers    . lactated ringers Stopped (10/12/20 0834)  . milrinone 0.125 mcg/kg/min (10/16/20 8657)  . norepinephrine (LEVOPHED) Adult infusion Stopped (10/12/20 0913)    PRN Medications: sodium chloride, dextrose, lactated ringers, lactulose, metoprolol tartrate, ondansetron (ZOFRAN) IV, oxyCODONE, sodium chloride flush, sodium chloride flush, traMADol   Assessment/Plan   1. CAD: S/P CABG x4 on 10/09/20. No chest pain.  - Post surgical management per CT Surgery - ASA 81 mg + Plavix 75 mg  - Atorvastatin 80 mg  2. Acute systolic CHF: Echo (8/46) with EF 35-40%, mild LVH (similar this admission and last).  Ischemic CM now s/p CABG. Repeat echo 3/3 LVEF improved 40-45%, RV mildly reduced. Co-ox 66% on milrinone 0.125. CVP 6-7 this morning on Lasix 10 mg/hr.  Creatinine and UOP much better, diuresed well yesterday.  - Stop milrinone and follow co-ox  - Stop Lasix gtt, switch to PO 20 mg daily   - Continue spironolactone 25 mg daily.  - Continue bidil  1 tab tid.  - Start Entresto 24-26 mg bid  - Consider SGLT2i prior to d/c  3. ID:  Concern for possible aspiration PNA.  CXR with bilateral infiltrates initially, now clearing with RUL infiltrate only (suspect she asymmetric edema or mucus plugging also). Afebrile. Cultures NGTD.  - On cefepime prior to surgery.  - Encouraged pulmonary toilet 4. Type 2 diabetes: Control improved recently.  5. VT: Noted in ER though strips not available.  - now on po amiodarone, will continue 1 month post-op then stop.    6. AKI: Creatinine down to 0.96, improved.   7. Hyponatremia: Resolving.  8. T2DM - Hgb A1c 7.5 - Consider SGLT2i   Continue to ambulate    Tiffany Jester PA-C 10/16/2020 7:14 AM  Patient seen with PA, agree with the above note.   Feels good this morning, diuresed well again and CVP 6.  Co-ox 66% on milrinone 0.125.   General: NAD Neck: No JVD, no thyromegaly or thyroid nodule.  Lungs: Decreased BS left base.  CV: Nondisplaced PMI.  Heart regular S1/S2, no S3/S4, no murmur.  No peripheral edema.   Abdomen: Soft, nontender, no hepatosplenomegaly, no distention.  Skin: Intact without lesions or rashes.  Neurologic: Alert and oriented  x 3.  Psych: Normal affect. Extremities: No clubbing or cyanosis.  HEENT: Normal.   Volume now improved, creatinine decreased to 0.96.  Good co-ox.  - Stop milrinone.  - Stop Lasix gtt, start Lasix 40 mg po daily.  - Decrease amlodipine to 5 mg daily and add Entresto 24/26 bid.   Left pleural effusion, refused thoracentesis yesterday.  Repeat CXR to see if effusion improved with diuresis => CXR shows moderate left effusion still, discussed IR thoracentesis with her, she is willing to have it done.    Should be ready for step down.   Tiffany Le 10/16/2020 7:53 AM

## 2020-10-16 NOTE — Plan of Care (Signed)
  Problem: Education: Goal: Knowledge of General Education information will improve Description: Including pain rating scale, medication(s)/side effects and non-pharmacologic comfort measures Outcome: Progressing   Problem: Health Behavior/Discharge Planning: Goal: Ability to manage health-related needs will improve Outcome: Progressing   Problem: Clinical Measurements: Goal: Ability to maintain clinical measurements within normal limits will improve Outcome: Progressing Goal: Will remain free from infection Outcome: Progressing Goal: Diagnostic test results will improve Outcome: Progressing Goal: Respiratory complications will improve Outcome: Progressing Goal: Cardiovascular complication will be avoided Outcome: Progressing   Problem: Activity: Goal: Risk for activity intolerance will decrease Outcome: Progressing   Problem: Coping: Goal: Level of anxiety will decrease Outcome: Progressing   Problem: Elimination: Goal: Will not experience complications related to bowel motility Outcome: Progressing Goal: Will not experience complications related to urinary retention Outcome: Progressing   Problem: Education: Goal: Ability to demonstrate management of disease process will improve Outcome: Progressing Goal: Ability to verbalize understanding of medication therapies will improve Outcome: Progressing Goal: Individualized Educational Video(s) Outcome: Progressing   Problem: Activity: Goal: Capacity to carry out activities will improve Outcome: Progressing   Problem: Cardiac: Goal: Ability to achieve and maintain adequate cardiopulmonary perfusion will improve Outcome: Progressing   Problem: Education: Goal: Will demonstrate proper wound care and an understanding of methods to prevent future damage Outcome: Progressing Goal: Knowledge of disease or condition will improve Outcome: Progressing Goal: Knowledge of the prescribed therapeutic regimen will  improve Outcome: Progressing Goal: Individualized Educational Video(s) Outcome: Progressing   Problem: Activity: Goal: Risk for activity intolerance will decrease Outcome: Progressing   Problem: Cardiac: Goal: Will achieve and/or maintain hemodynamic stability Outcome: Progressing   Problem: Clinical Measurements: Goal: Postoperative complications will be avoided or minimized Outcome: Progressing   Problem: Respiratory: Goal: Respiratory status will improve Outcome: Progressing   Problem: Skin Integrity: Goal: Wound healing without signs and symptoms of infection Outcome: Progressing Goal: Risk for impaired skin integrity will decrease Outcome: Progressing   Problem: Urinary Elimination: Goal: Ability to achieve and maintain adequate renal perfusion and functioning will improve Outcome: Progressing

## 2020-10-16 NOTE — Progress Notes (Signed)
Patient spoke with Dr Aundra Dubin and agrees to Thoracentesis.  Called IR.  Patient on the list for procedure sometime before lunch today.

## 2020-10-16 NOTE — Progress Notes (Signed)
Patient off unit to Interventional Radiology for Thoracentesis.

## 2020-10-16 NOTE — Progress Notes (Signed)
Patient ID: Tiffany Le, female   DOB: Jul 18, 1976, 44 y.o.   MRN: [id number] TCTS Evening Rounds:  Hemodynamically stable in sinus rhythm  CVP 6  sats 95% RA  She had left thoracentesis today but only 100 cc of fluid could be removed. Pt refused any further attempts.  Sill get 2V CXR in am to reassess

## 2020-10-17 DIAGNOSIS — I5023 Acute on chronic systolic (congestive) heart failure: Secondary | ICD-10-CM

## 2020-10-17 LAB — GLUCOSE, CAPILLARY
Glucose-Capillary: 89 mg/dL (ref 70–99)
Glucose-Capillary: 135 mg/dL — ABNORMAL HIGH (ref 70–99)
Glucose-Capillary: 174 mg/dL — ABNORMAL HIGH (ref 70–99)
Glucose-Capillary: 166 mg/dL — ABNORMAL HIGH (ref 70–99)

## 2020-10-17 LAB — COOXEMETRY PANEL
Carboxyhemoglobin: 1.4 % (ref 0.5–1.5)
Methemoglobin: 1.1 % (ref 0.0–1.5)
O2 Saturation: 61.2 %
Total hemoglobin: 8.8 g/dL — ABNORMAL LOW (ref 12.0–16.0)

## 2020-10-17 LAB — CBC
HCT: 28.5 % — ABNORMAL LOW (ref 36.0–46.0)
Hemoglobin: 9.4 g/dL — ABNORMAL LOW (ref 12.0–15.0)
MCH: 27.6 pg (ref 26.0–34.0)
MCHC: 33 g/dL (ref 30.0–36.0)
MCV: 83.6 fL (ref 80.0–100.0)
Platelets: 502 10*3/uL — ABNORMAL HIGH (ref 150–400)
RBC: 3.41 MIL/uL — ABNORMAL LOW (ref 3.87–5.11)
RDW: 14.2 % (ref 11.5–15.5)
WBC: 12.7 10*3/uL — ABNORMAL HIGH (ref 4.0–10.5)
nRBC: 0 % (ref 0.0–0.2)

## 2020-10-17 LAB — BASIC METABOLIC PANEL
Anion gap: 11 (ref 5–15)
BUN: 37 mg/dL — ABNORMAL HIGH (ref 6–20)
CO2: 29 mmol/L (ref 22–32)
Calcium: 8.5 mg/dL — ABNORMAL LOW (ref 8.9–10.3)
Chloride: 94 mmol/L — ABNORMAL LOW (ref 98–111)
Creatinine, Ser: 1.1 mg/dL — ABNORMAL HIGH (ref 0.44–1.00)
GFR, Estimated: >60 mL/min (ref >60)
Glucose, Bld: 137 mg/dL — ABNORMAL HIGH (ref 70–99)
Potassium: 4.1 mmol/L (ref 3.5–5.1)
Sodium: 134 mmol/L — ABNORMAL LOW (ref 135–145)

## 2020-10-17 MED ORDER — DAPAGLIFLOZIN PROPANEDIOL 10 MG PO TABS
10.0000 mg | ORAL_TABLET | Freq: Every day | ORAL | Status: DC
  Administered 2020-10-17 – 2020-10-27 (×11): 10 mg via ORAL
  Filled 2020-10-17 (×11): qty 1

## 2020-10-17 MED ORDER — FUROSEMIDE 40 MG PO TABS
40.0000 mg | ORAL_TABLET | Freq: Every day | ORAL | Status: DC
  Administered 2020-10-18 – 2020-10-20 (×3): 40 mg via ORAL
  Filled 2020-10-17 (×3): qty 1

## 2020-10-17 MED ORDER — FUROSEMIDE 10 MG/ML IJ SOLN
40.0000 mg | Freq: Once | INTRAMUSCULAR | Status: AC
  Administered 2020-10-17: 40 mg via INTRAVENOUS
  Filled 2020-10-17: qty 4

## 2020-10-17 MED ORDER — AMIODARONE HCL 200 MG PO TABS
200.0000 mg | ORAL_TABLET | Freq: Two times a day (BID) | ORAL | Status: DC
  Administered 2020-10-17 – 2020-10-18 (×4): 200 mg via ORAL
  Filled 2020-10-17 (×4): qty 1

## 2020-10-17 NOTE — Progress Notes (Signed)
8 Days Post-Op Procedure(s) (LRB): CORONARY ARTERY BYPASS GRAFTING (CABG), ON PUMP, TIMES FOUR, USING LEFT INTERNAL MAMMARY ARTERY AND LEFT RADIAL ARTERY (OPEN HARVEST) (N/A) RADIAL ARTERY HARVEST (Left) TRANSESOPHAGEAL ECHOCARDIOGRAM (TEE) (N/A) INDOCYANINE GREEN FLUORESCENCE IMAGING (ICG) (N/A) CLIPPING OF ATRIAL APPENDAGE USING ATRICURE 35MM CLIP (N/A) Subjective: Feeling better/stronger  Objective: Vital signs in last 24 hours: Temp:  [97.6 F (36.4 C)-98.6 F (37 C)] 97.9 F (36.6 C) (03/09 0400) Pulse Rate:  [78-90] 85 (03/09 0700) Cardiac Rhythm: Normal sinus rhythm (03/08 2015) Resp:  [17-31] 22 (03/09 0700) BP: (101-169)/(50-96) 143/75 (03/09 0700) SpO2:  [92 %-100 %] 94 % (03/09 0700) Weight:  [476 kg] 113 kg (03/09 0452)  Hemodynamic parameters for last 24 hours: CVP:  [0 mmHg-10 mmHg] 6 mmHg  Intake/Output from previous day: 03/08 0701 - 03/09 0700 In: 958.4 [P.O.:940; I.V.:18.4] Out: 1425 [Urine:1425] Intake/Output this shift: No intake/output data recorded.  General appearance: alert and cooperative Neurologic: intact Heart: regular rate and rhythm, S1, S2 normal, no murmur, click, rub or gallop Lungs: clear to auscultation bilaterally Abdomen: soft, non-tender; bowel sounds normal; no masses,  no organomegaly Extremities: extremities normal, atraumatic, no cyanosis or edema Wound: dressed, dry  Lab Results: Recent Labs    10/16/20 0458 10/17/20 0748  WBC 12.8* 12.7*  HGB 8.9* 9.4*  HCT 27.7* 28.5*  PLT 446* 502*   BMET:  Recent Labs    10/15/20 0449 10/15/20 1416  NA 134* 133*  K 4.0 4.0  CL 99 97*  CO2 26 26  GLUCOSE 113* 200*  BUN 43* 40*  CREATININE 1.06* 0.96  CALCIUM 8.3* 8.1*    PT/INR: No results for input(s): LABPROT, INR in the last 72 hours. ABG    Component Value Date/Time   PHART 7.364 10/09/2020 2158   HCO3 23.9 10/09/2020 2158   TCO2 25 10/09/2020 2158   ACIDBASEDEF 1.0 10/09/2020 2158   O2SAT 61.2 10/17/2020 0425    CBG (last 3)  Recent Labs    10/16/20 1539 10/16/20 2122 10/17/20 0626  GLUCAP 180* 196* 89    Assessment/Plan: S/P Procedure(s) (LRB): CORONARY ARTERY BYPASS GRAFTING (CABG), ON PUMP, TIMES FOUR, USING LEFT INTERNAL MAMMARY ARTERY AND LEFT RADIAL ARTERY (OPEN HARVEST) (N/A) RADIAL ARTERY HARVEST (Left) TRANSESOPHAGEAL ECHOCARDIOGRAM (TEE) (N/A) INDOCYANINE GREEN FLUORESCENCE IMAGING (ICG) (N/A) CLIPPING OF ATRIAL APPENDAGE USING ATRICURE 35MM CLIP (N/A) Mobilize Diuresis Plan for transfer to step-down: see transfer orders   LOS: 15 days    [name] 10/17/2020

## 2020-10-17 NOTE — Progress Notes (Signed)
EVENING ROUNDS NOTE :     [address]       [place] [address]             [phone]                 8 Days Post-Op Procedure(s) (LRB): CORONARY ARTERY BYPASS GRAFTING (CABG), ON PUMP, TIMES FOUR, USING LEFT INTERNAL MAMMARY ARTERY AND LEFT RADIAL ARTERY (OPEN HARVEST) (N/A) RADIAL ARTERY HARVEST (Left) TRANSESOPHAGEAL ECHOCARDIOGRAM (TEE) (N/A) INDOCYANINE GREEN FLUORESCENCE IMAGING (ICG) (N/A) CLIPPING OF ATRIAL APPENDAGE USING ATRICURE 35MM CLIP (N/A)   Total Length of Stay:  LOS: 15 days  Events:   No events.  Awaiting transfer    BP 137/85   Pulse 89   Temp 98 F (36.7 C) (Oral)   Resp (!) 21   Wt 113 kg   LMP  (LMP Unknown)   SpO2 94%   BMI 42.76 kg/m   CVP:  [3 mmHg-10 mmHg] 6 mmHg     . sodium chloride    . sodium chloride    . sodium chloride 20 mL/hr at 10/09/20 1510  . lactated ringers    . lactated ringers    . lactated ringers Stopped (10/12/20 0834)    I/O last 3 completed shifts: In: 1441.8 [P.O.:1300; I.V.:141.8] Out: 3250 [Urine:3250]   CBC Latest Ref Rng & Units 10/17/2020 10/16/2020 10/15/2020  WBC 4.0 - 10.5 K/uL 12.7(H) 12.8(H) 12.7(H)  Hemoglobin 12.0 - 15.0 g/dL 9.4(L) 8.9(L) 9.3(L)  Hematocrit 36.0 - 46.0 % 28.5(L) 27.7(L) 29.4(L)  Platelets 150 - 400 K/uL 502(H) 446(H) 418(H)    BMP Latest Ref Rng & Units 10/17/2020 10/15/2020 10/15/2020  Glucose 70 - 99 mg/dL 137(H) 200(H) 113(H)  BUN 6 - 20 mg/dL 37(H) 40(H) 43(H)  Creatinine 0.44 - 1.00 mg/dL 1.10(H) 0.96 1.06(H)  BUN/Creat Ratio 9 - 23 - - -  Sodium 135 - 145 mmol/L 134(L) 133(L) 134(L)  Potassium 3.5 - 5.1 mmol/L 4.1 4.0 4.0  Chloride 98 - 111 mmol/L 94(L) 97(L) 99  CO2 22 - 32 mmol/L 29 26 26   Calcium 8.9 - 10.3 mg/dL 8.5(L) 8.1(L) 8.3(L)    ABG    Component Value Date/Time   PHART 7.364 10/09/2020 2158   PCO2ART 41.9 10/09/2020 2158   PO2ART 128 (H) 10/09/2020 2158   HCO3 23.9 10/09/2020 2158   TCO2 25 10/09/2020 2158   ACIDBASEDEF 1.0 10/09/2020  2158   O2SAT 61.2 10/17/2020 0425       Tiffany Bouillon, MD 10/17/2020 5:59 PM

## 2020-10-17 NOTE — Progress Notes (Addendum)
Patient ID: Tiffany Le, female   DOB: 30-Aug-1975, 45 y.o.   MRN: [id number]     Advanced Heart Failure Rounding Note  PCP-Cardiologist: Quay Burow, MD   Subjective:    10/09/20 S/P CABG x4  Left Radial Harvest 3/8 s/p left thoracentesis w/ removal of 100 mL light red fluid.  Post thoracentesis CXR showed no change in small to moderate-sized layering left-sided effusion. No pneumothorax. Otherwise, similar findings of cardiomegaly, pulmonary edema, trace right-sided effusion and bibasilar heterogeneous/consolidative opacities, left greater than right, atelectasis versus infiltrate.  Co-ox stable off milrinone, 61%.   Off Lasix gtt and on PO Lasix, -1.4L in UOP yesterday. Net I/Os for the day only negative 500 cc. Wt up 1 lb. CVP trending up ~9-10. BMP pending.   Feels ok. No complaints.   SBPs ok w/ Entresto. Lowest SBP 101.    Echo post-op EF slightly better 40-45%. RV mildly reduced.   Objective:   Weight Range: 113 kg Body mass index is 42.76 kg/m.   Vital Signs:   Temp:  [97.6 F (36.4 C)-98.6 F (37 C)] 97.9 F (36.6 C) (03/09 0400) Pulse Rate:  [78-90] 85 (03/09 0700) Resp:  [17-31] 22 (03/09 0700) BP: (101-169)/(50-96) 143/75 (03/09 0700) SpO2:  [92 %-100 %] 94 % (03/09 0700) Weight:  [106 kg] 113 kg (03/09 0452) Last BM Date: 10/16/20  Weight change: Filed Weights   10/15/20 0625 10/16/20 0500 10/17/20 0452  Weight: 116.3 kg 112.8 kg 113 kg    Intake/Output:   Intake/Output Summary (Last 24 hours) at 10/17/2020 0715 Last data filed at 10/17/2020 0445 Gross per 24 hour  Intake 838.39 ml  Output 1425 ml  Net -586.61 ml      Physical Exam   CVP 9-10  General:  Well appearing. No respiratory difficulty HEENT: normal Neck: supple. JVD ~9 cm. Carotids 2+ bilat; no bruits. No lymphadenopathy or thyromegaly appreciated. Cor: PMI nondisplaced. Regular rate & rhythm. No rubs, gallops or murmurs. + sternal wound dressing  Lungs: mildly decreased BS  at the bases L>R Abdomen: soft, nontender, nondistended. No hepatosplenomegaly. No bruits or masses. Good bowel sounds. Extremities: no cyanosis, clubbing, rash, trace bilateral edema Neuro: alert & oriented x 3, cranial nerves grossly intact. moves all 4 extremities w/o difficulty. Affect pleasant.     Telemetry   Sinus 80s Personally reviewed  Labs    CBC Recent Labs    10/15/20 0449 10/16/20 0458  WBC 12.7* 12.8*  HGB 9.3* 8.9*  HCT 29.4* 27.7*  MCV 86.0 85.0  PLT 418* 269*   Basic Metabolic Panel Recent Labs    10/15/20 0449 10/15/20 1416  NA 134* 133*  K 4.0 4.0  CL 99 97*  CO2 26 26  GLUCOSE 113* 200*  BUN 43* 40*  CREATININE 1.06* 0.96  CALCIUM 8.3* 8.1*   Liver Function Tests Recent Labs    10/15/20 0449  AST 26  ALT 25  ALKPHOS 59  BILITOT 0.4  PROT 6.4*  ALBUMIN 2.2*   No results for input(s): LIPASE, AMYLASE in the last 72 hours. Cardiac Enzymes No results for input(s): CKTOTAL, CKMB, CKMBINDEX, TROPONINI in the last 72 hours.  BNP: BNP (last 3 results) Recent Labs    10/02/20 0516  BNP 947.1*    ProBNP (last 3 results) No results for input(s): PROBNP in the last 8760 hours.   D-Dimer No results for input(s): DDIMER in the last 72 hours. Hemoglobin A1C No results for input(s): HGBA1C in the last 72 hours. Fasting  Lipid Panel No results for input(s): CHOL, HDL, LDLCALC, TRIG, CHOLHDL, LDLDIRECT in the last 72 hours. Thyroid Function Tests No results for input(s): TSH, T4TOTAL, T3FREE, THYROIDAB in the last 72 hours.  Invalid input(s): FREET3  Other results:   Imaging    DG Chest 1 View  Result Date: 10/16/2020 CLINICAL DATA:  Post attempted left-sided thoracentesis EXAM: CHEST  1 VIEW COMPARISON:  10/16/2020; 10/15/2020 FINDINGS: Grossly unchanged enlarged cardiac silhouette and mediastinal contours post median sternotomy, CABG and atrial appendage clipping. Stable position of support apparatus. No significant change in  small to moderate-sized layering left-sided effusion with associated left mid and lower lung heterogeneous/consolidative opacities. Pulmonary vasculature appears indistinct with cephalization of flow. Unchanged trace right-sided pleural effusion. No acute osseous abnormalities. IMPRESSION: 1. No change in small to moderate-sized layering left-sided effusion post thoracentesis. No pneumothorax. 2. Otherwise, similar findings of cardiomegaly, pulmonary edema, trace right-sided effusion and bibasilar heterogeneous/consolidative opacities, left greater than right, atelectasis versus infiltrate. Electronically Signed   By: Sandi Mariscal M.D.   On: 10/16/2020 13:36   DG CHEST PORT 1 VIEW  Result Date: 10/16/2020 CLINICAL DATA:  Pleural effusion EXAM: PORTABLE CHEST 1 VIEW COMPARISON:  October 15, 2020 FINDINGS: Central catheter tip is in the superior vena cava near the cavoatrial junction. No pneumothorax. There is a moderate left pleural effusion with atelectatic change in the left lower lung region. Right lung is clear. There is stable cardiac prominence with mild pulmonary venous hypertension. No adenopathy. Patient is status post coronary artery bypass grafting. There is a left atrial appendage clip. No bone lesions. IMPRESSION: Left pleural effusion with left lower lobe atelectasis. Central catheter as described without pneumothorax. Stable cardiac prominence with postoperative changes. Electronically Signed   By: Lowella Grip III M.D.   On: 10/16/2020 08:22   IR THORACENTESIS ASP PLEURAL SPACE W/IMG GUIDE  Result Date: 10/16/2020 INDICATION: Patient with history of CABG x4 10/09/2020, dyspnea, and left pleural effusion. Request is made for therapeutic left thoracentesis. EXAM: ULTRASOUND GUIDED THERAPEUTIC LEFT THORACENTESIS MEDICATIONS: 15 mL 1% lidocaine COMPLICATIONS: None immediate. PROCEDURE: An ultrasound guided thoracentesis was thoroughly discussed with the patient and questions answered. The benefits,  risks, alternatives and complications were also discussed. The patient understands and wishes to proceed with the procedure. Written consent was obtained. Ultrasound was performed to localize and mark an adequate pocket of fluid in the left chest. The area was then prepped and draped in the normal sterile fashion. 1% Lidocaine was used for local anesthesia. Under ultrasound guidance a 6 Fr Safe-T-Centesis catheter was introduced. Thoracentesis was performed however only a small amount of fluid was able to be aspirated. Attempts were made to reposition the catheter however the patient declined. As such, the procedure was terminated. The catheter was removed and a dressing applied. FINDINGS: A total of approximately 100 mL of light red fluid was removed. Attempt to reposition catheter to increase drainage, however patient declined. IMPRESSION: Successful ultrasound guided left thoracentesis yielding 100 mL of pleural fluid. Read by: Earley Abide, PA-C Electronically Signed   By: Sandi Mariscal M.D.   On: 10/16/2020 13:35     Medications:     Scheduled Medications: . amiodarone  400 mg Oral BID  . amLODipine  5 mg Oral Daily  . aspirin EC  81 mg Oral Daily  . atorvastatin  80 mg Oral Daily  . bisacodyl  10 mg Oral Daily  . carvedilol  6.25 mg Oral BID WC  . Chlorhexidine Gluconate Cloth  6 each  Topical Daily  . clopidogrel  75 mg Oral Daily  . colchicine  0.3 mg Oral BID  . docusate sodium  200 mg Oral Daily  . enoxaparin (LOVENOX) injection  40 mg Subcutaneous Q24H  . furosemide  40 mg Oral Daily  . gabapentin  100 mg Oral TID  . glipiZIDE  10 mg Oral BID AC  . guaiFENesin  600 mg Oral BID  . insulin aspart  0-24 Units Subcutaneous TID WC  . insulin detemir  18 Units Subcutaneous BID  . isosorbide-hydrALAZINE  1 tablet Oral TID  . pantoprazole  40 mg Oral Daily  . sacubitril-valsartan  1 tablet Oral BID  . spironolactone  25 mg Oral Daily    Infusions: . sodium chloride    . sodium  chloride    . sodium chloride 20 mL/hr at 10/09/20 1510  . lactated ringers    . lactated ringers    . lactated ringers Stopped (10/12/20 0834)    PRN Medications: sodium chloride, dextrose, lactated ringers, lactulose, metoprolol tartrate, ondansetron (ZOFRAN) IV, oxyCODONE, sodium chloride flush, sodium chloride flush, traMADol   Assessment/Plan   1. CAD: S/P CABG x4 on 10/09/20. No chest pain.  - Post surgical management per CT Surgery - ASA 81 mg + Plavix 75 mg  - Atorvastatin 80 mg  2. Acute systolic CHF: Echo (5/78) with EF 35-40%, mild LVH (similar this admission and last).  Ischemic CM now s/p CABG. Repeat echo 3/3 LVEF improved 40-45%, RV mildly reduced. Now off milrinone. Co-ox 61%. CVP trending back up 9-10 today.  - Add Farxiga 10 mg  - Continue spironolactone 25 mg daily.  - Continue bidil  1 tab tid.  - Continue Entresto 24-26 mg bid. Increase tomorrow if BP stable.   - Continue Lasix 40 mg daily  3. ID:  Concern for possible aspiration PNA.  CXR with bilateral infiltrates initially, now clearing with RUL infiltrate only (suspect she asymmetric edema or mucus plugging also). Afebrile. Cultures NGTD.  - On cefepime prior to surgery.  - Encouraged pulmonary toilet 4. Type 2 diabetes: Control improved recently. Hgb A1c 7.5 - start SGLT2i, Farxiga 10 mg daily  5. VT: Noted in ER though strips not available.  - now on po amiodarone, will continue 1 month post-op then stop.    6. AKI: Resolved. Creatinine down to 0.96 yesterday. Repeat BMP pending  7. Hyponatremia: Resolving.   Continue to ambulate. Ok to transfer to stepdown unit.   Lyda Jester PA-C 10/17/2020 7:15 AM  Patient seen with PA, agree with the above note.   No complaints today. Thoracentesis on left yesterday, only 100 cc out.  CVP 8 this morning on my reading.  Co-ox 61%.  Creatinine stable.   General: NAD Neck: Thick, JVP 8-9 cm, no thyromegaly or thyroid nodule.  Lungs: Clear to auscultation  bilaterally with normal respiratory effort. CV: Nondisplaced PMI.  Heart regular S1/S2, no S3/S4, no murmur.  No peripheral edema.   Abdomen: Soft, nontender, no hepatosplenomegaly, no distention.  Skin: Intact without lesions or rashes.  Neurologic: Alert and oriented x 3.  Psych: Normal affect. Extremities: No clubbing or cyanosis.  HEENT: Normal.   Would give 1 dose of Lasix 40 mg IV today then to 40 mg po daily tomorrow.  Add [place].    Can decrease amiodarone to 200 mg bid.   Continue to walk in halls.  Transfer to step down.  Should be ready for home soon.   Loralie Champagne 10/17/2020 7:53 AM

## 2020-10-17 NOTE — Progress Notes (Signed)
CARDIAC REHAB PHASE I   PRE:  Rate/Rhythm: 84 SR  BP:  Sitting: 146/84      SaO2: 96 RA  MODE:  Ambulation: 25 ft   POST:  Rate/Rhythm: 89 SR  BP:  Sitting: 128/57    SaO2: 94 RA  Pt c/o dizziness upon entering room, but agreeable to try to walk. Pt assist of 2 for safety and equipment with gait belt and EVA. Pt ambulated 85ft outside of room and requested to return to bed. Pt states she is having a bad day. Pt returned to bed, call bell and bedside table within reach. Pt states some discouragement. Provided support and encouragement to pt, encouraged pt to take it day by day. Will continue to follow.  [phone] Rufina Falco, RN BSN 10/17/2020 2:34 PM

## 2020-10-17 NOTE — Progress Notes (Signed)
CARDIAC REHAB PHASE I   Offered to walk with pt. Pt states she just back into bed. Requesting to walk after lunch. Encouraged pt to sit in chair for lunch. Will f/u to encourage continued ambulation.  Rufina Falco, RN BSN 10/17/2020 11:00 AM

## 2020-10-17 NOTE — Progress Notes (Signed)
Occupational Therapy Treatment Patient Details Name: Tiffany Le MRN: [id number] DOB: 1975/12/18 Today's Date: 10/17/2020    History of present illness Pt is a 45 y.o. female recently admitted 2/14-2/17/22 with NSTEMI and LHC with severe CAD, recommended CABG vs. PCI, but left AMA, pt now readmitted 10/02/20 with respiratory distress; ETT 2/22-2/23. Pt with acute hypoxic respiratory failure likely secondary to acute cardiogenic pulmonary edema. S/p CABGx4 3/1. PMH includes HTN, DM2, CAD, HF.   OT comments  Patient continues to make nice progress toward all stated OT goals.  She was able to mobilize to bathroom for [facility] usage, and stand to perform hand hygiene all with supervision.  She did need Mod A for peri care, and Mod A for sit to supine.  She is very conscious of her sternal precautions, and should continue to advance.  OT will continue in the acute setting to maximize her functional status.  [place] [facility] acute is indicated.    Follow Up Recommendations  Home health OT    Equipment Recommendations  3 in 1 bedside commode    Recommendations for Other Services      Precautions / Restrictions Precautions Precautions: Fall;Sternal Restrictions Weight Bearing Restrictions: Yes RUE Weight Bearing: Weight bearing as tolerated LUE Weight Bearing: Weight bearing as tolerated       Mobility Bed Mobility   Bed Mobility: Sit to Supine     Supine to sit: HOB elevated;Mod assist       Patient Response: Cooperative  Transfers Overall transfer level: Needs assistance Equipment used: Rolling walker (2 wheeled) Transfers: Stand Pivot Transfers;Sit to/from Stand Sit to Stand: Supervision Stand pivot transfers: Supervision       General transfer comment: use of momentum, hands on knees    Balance           Standing balance support: Bilateral upper extremity supported Standing balance-Leahy Scale: Fair Standing balance comment: fair static standing                            ADL either performed or assessed with clinical judgement   ADL       Grooming: Wash/dry hands;Standing;Supervision/safety                   Toilet Transfer: Supervision/safety;RW;BSC   Toileting- Water quality scientist and Hygiene: Moderate assistance;Sit to/from stand       Functional mobility during ADLs: Supervision/safety;Rolling walker       Vision       Perception     Praxis      Cognition Arousal/Alertness: Awake/alert   Overall Cognitive Status: Within Functional Limits for tasks assessed                                           Pertinent Vitals/ Pain       Pain Assessment: Faces Faces Pain Scale: Hurts a little bit Pain Location: chest/incision Pain Descriptors / Indicators: Tender Pain Intervention(s): Monitored during session                                                          Frequency  Min 2X/week        Progress  Toward Goals  OT Goals(current goals can now be found in the care plan section)  Progress towards OT goals: Progressing toward goals  Acute Rehab OT Goals Patient Stated Goal: looking forward to going to a different room and becoming more independent. OT Goal Formulation: With patient Time For Goal Achievement: 10/20/20 Potential to Achieve Goals: Good  Plan Discharge plan remains appropriate    Co-evaluation                 AM-PAC OT "6 Clicks" Daily Activity     Outcome Measure   Help from another person eating meals?: None Help from another person taking care of personal grooming?: A Little Help from another person toileting, which includes using toliet, bedpan, or urinal?: A Lot Help from another person bathing (including washing, rinsing, drying)?: A Lot Help from another person to put on and taking off regular upper body clothing?: A Little Help from another person to put on and taking off regular lower body clothing?: A Lot 6 Click Score:  16    End of Session Equipment Utilized During Treatment: Rolling walker  OT Visit Diagnosis: Unsteadiness on feet (R26.81);Muscle weakness (generalized) (M62.81);Dizziness and giddiness (R42)   Activity Tolerance Patient tolerated treatment well   Patient Left in bed;with call bell/phone within reach;with nursing/sitter in room   Nurse Communication Mobility status        Time: 6378-5885 OT Time Calculation (min): 25 min  Charges: OT General Charges $OT Visit: 1 Visit OT Treatments $Self Care/Home Management : 23-37 mins  10/17/2020  Rich, OTR/L  Acute Rehabilitation Services  Office:  [place] 10/17/2020, 10:21 AM

## 2020-10-18 LAB — BASIC METABOLIC PANEL
Anion gap: 8 (ref 5–15)
BUN: 33 mg/dL — ABNORMAL HIGH (ref 6–20)
CO2: 32 mmol/L (ref 22–32)
Calcium: 8.5 mg/dL — ABNORMAL LOW (ref 8.9–10.3)
Chloride: 96 mmol/L — ABNORMAL LOW (ref 98–111)
Creatinine, Ser: 1.17 mg/dL — ABNORMAL HIGH (ref 0.44–1.00)
GFR, Estimated: 59 mL/min — ABNORMAL LOW (ref >60)
Glucose, Bld: 119 mg/dL — ABNORMAL HIGH (ref 70–99)
Potassium: 3.8 mmol/L (ref 3.5–5.1)
Sodium: 136 mmol/L (ref 135–145)

## 2020-10-18 LAB — CBC
HCT: 28.1 % — ABNORMAL LOW (ref 36.0–46.0)
Hemoglobin: 8.8 g/dL — ABNORMAL LOW (ref 12.0–15.0)
MCH: 26.7 pg (ref 26.0–34.0)
MCHC: 31.3 g/dL (ref 30.0–36.0)
MCV: 85.2 fL (ref 80.0–100.0)
Platelets: 490 10*3/uL — ABNORMAL HIGH (ref 150–400)
RBC: 3.3 MIL/uL — ABNORMAL LOW (ref 3.87–5.11)
RDW: 14.2 % (ref 11.5–15.5)
WBC: 10.9 10*3/uL — ABNORMAL HIGH (ref 4.0–10.5)
nRBC: 0 % (ref 0.0–0.2)

## 2020-10-18 LAB — GLUCOSE, CAPILLARY
Glucose-Capillary: 116 mg/dL — ABNORMAL HIGH (ref 70–99)
Glucose-Capillary: 98 mg/dL (ref 70–99)
Glucose-Capillary: 119 mg/dL — ABNORMAL HIGH (ref 70–99)
Glucose-Capillary: 129 mg/dL — ABNORMAL HIGH (ref 70–99)

## 2020-10-18 LAB — COOXEMETRY PANEL
Carboxyhemoglobin: 1.3 % (ref 0.5–1.5)
Methemoglobin: 0.9 % (ref 0.0–1.5)
O2 Saturation: 61 %
Total hemoglobin: 12.3 g/dL (ref 12.0–16.0)

## 2020-10-18 LAB — URIC ACID: Uric Acid, Serum: 9.1 mg/dL — ABNORMAL HIGH (ref 2.5–7.1)

## 2020-10-18 MED ORDER — COLCHICINE 0.6 MG PO TABS
1.2000 mg | ORAL_TABLET | Freq: Once | ORAL | Status: AC
  Administered 2020-10-18: 1.2 mg via ORAL
  Filled 2020-10-18: qty 2

## 2020-10-18 MED ORDER — COLCHICINE 0.6 MG PO TABS: 0.6000 mg | ORAL_TABLET | Freq: Every day | ORAL | Status: DC

## 2020-10-18 MED ORDER — COLCHICINE 0.6 MG PO TABS
0.6000 mg | ORAL_TABLET | Freq: Two times a day (BID) | ORAL | Status: DC
  Administered 2020-10-18 (×2): 0.6 mg via ORAL
  Filled 2020-10-18: qty 1

## 2020-10-18 MED ORDER — CARVEDILOL 12.5 MG PO TABS
12.5000 mg | ORAL_TABLET | Freq: Two times a day (BID) | ORAL | Status: DC
  Administered 2020-10-18 – 2020-10-27 (×17): 12.5 mg via ORAL
  Filled 2020-10-18 (×20): qty 1

## 2020-10-18 MED ORDER — POTASSIUM CHLORIDE CRYS ER 20 MEQ PO TBCR
40.0000 meq | EXTENDED_RELEASE_TABLET | Freq: Once | ORAL | Status: AC
  Administered 2020-10-18: 40 meq via ORAL
  Filled 2020-10-18: qty 2

## 2020-10-18 NOTE — TOC Progression Note (Signed)
Transition of Care (TOC) - Progression Note Heart Failure   Patient Details  Name: Tiffany Le MRN: [id number] Date of Birth: [birth year]  Transition of Care [facility]) CM/SW [place], [place] Phone Number: 10/18/2020, 3:05 PM  Clinical Narrative:    CSW provided [name] with a Therapist, sports including information and application for the [employer], medical supply store, CAFA application and food resources. CSW sent referral to the [facility] for help with the food stamps application. CSW provided [name] and family members CSW contact information and the [place] outpatient social worker contact information for her follow up appointment with [place] clinic upon discharge.    Expected Discharge Plan: [place] Barriers to Discharge: No Barriers Identified  Expected Discharge Plan and Services Expected Discharge Plan: [place] In-house Referral: NA Discharge Planning Services: CM Consult Post Acute Care Choice: [place] Living arrangements for the past 2 months: Apartment                 DME Arranged: Gilford Rile rolling,Bedside commode DME Agency: [employer] Date DME Agency Contacted: 10/18/20 Time DME Agency Contacted: 1134 Representative spoke with at DME Agency: [place]: PT,OT [place] Agency: [place] ([place]) Date [place]: 10/10/20 Time [place]: 1400 Representative spoke with at [place]: [place] Determinants of Health ([place]) Interventions Financial Strain Interventions: Other (Comment) (pt. reports she would like to file for disability, and has no health insurance, and she has to pay out of pocket for all her meds, and doesn't have food stamps and has to walk up steps to her apartment with no elevator) Housing Interventions: Intervention Not Indicated Stress Interventions: Intervention Not Indicated Transportation Interventions: [facility]  Transportation Services  Readmission Risk Interventions Readmission Risk Prevention Plan 10/11/2020  Transportation Screening Complete  HRI or [place] Complete  Social Work Consult for [place] Planning/Counseling Complete  Palliative Care Screening Not Applicable  Medication Review Press photographer) Complete  Some recent data might be hidden   [place], MSW, LCSWA [phone] Heart Failure Social Worker

## 2020-10-18 NOTE — Progress Notes (Signed)
Patient ID: Tiffany Le, female   DOB: October 21, 1975, 45 y.o.   MRN: [id number]     Advanced Heart Failure Rounding Note  PCP-Cardiologist: Quay Burow, MD   Subjective:    10/09/20 S/P CABG x4  Left Radial Harvest 3/8 s/p left thoracentesis w/ removal of 100 mL light red fluid.  Co-ox stable off milrinone, 61%. CVP 9 today.   No dyspnea, main complaint is left knee pain.   Echo post-op EF slightly better 40-45%. RV mildly reduced.   Objective:   Weight Range: 112.8 kg Body mass index is 42.69 kg/m.   Vital Signs:   Temp:  [98 F (36.7 C)-98.7 F (37.1 C)] 98.1 F (36.7 C) (03/10 0400) Pulse Rate:  [80-89] 83 (03/10 0704) Resp:  [14-26] 21 (03/10 0704) BP: (102-171)/(52-94) 142/67 (03/10 0704) SpO2:  [93 %-100 %] 96 % (03/10 0704) Weight:  [112.8 kg] 112.8 kg (03/10 0600) Last BM Date: 10/17/20  Weight change: Filed Weights   10/16/20 0500 10/17/20 0452 10/18/20 0600  Weight: 112.8 kg 113 kg 112.8 kg    Intake/Output:   Intake/Output Summary (Last 24 hours) at 10/18/2020 0741 Last data filed at 10/18/2020 0600 Gross per 24 hour  Intake 720 ml  Output 1351 ml  Net -631 ml      Physical Exam   CVP 9 General: NAD Neck: No JVD, no thyromegaly or thyroid nodule.  Lungs: Clear to auscultation bilaterally with normal respiratory effort. CV: Nondisplaced PMI.  Heart regular S1/S2, no S3/S4, no murmur.  1+ ankle edema.  Abdomen: Soft, nontender, no hepatosplenomegaly, no distention.  Skin: Intact without lesions or rashes.  Neurologic: Alert and oriented x 3.  Psych: Normal affect. Extremities: No clubbing or cyanosis.  HEENT: Normal.    Telemetry   Sinus 80s. Personally reviewed  Labs    CBC Recent Labs    10/17/20 0748 10/18/20 0347  WBC 12.7* 10.9*  HGB 9.4* 8.8*  HCT 28.5* 28.1*  MCV 83.6 85.2  PLT 502* 956*   Basic Metabolic Panel Recent Labs    10/17/20 0748 10/18/20 0347  NA 134* 136  K 4.1 3.8  CL 94* 96*  CO2 29 32  GLUCOSE  137* 119*  BUN 37* 33*  CREATININE 1.10* 1.17*  CALCIUM 8.5* 8.5*   Liver Function Tests No results for input(s): AST, ALT, ALKPHOS, BILITOT, PROT, ALBUMIN in the last 72 hours. No results for input(s): LIPASE, AMYLASE in the last 72 hours. Cardiac Enzymes No results for input(s): CKTOTAL, CKMB, CKMBINDEX, TROPONINI in the last 72 hours.  BNP: BNP (last 3 results) Recent Labs    10/02/20 0516  BNP 947.1*    ProBNP (last 3 results) No results for input(s): PROBNP in the last 8760 hours.   D-Dimer No results for input(s): DDIMER in the last 72 hours. Hemoglobin A1C No results for input(s): HGBA1C in the last 72 hours. Fasting Lipid Panel No results for input(s): CHOL, HDL, LDLCALC, TRIG, CHOLHDL, LDLDIRECT in the last 72 hours. Thyroid Function Tests No results for input(s): TSH, T4TOTAL, T3FREE, THYROIDAB in the last 72 hours.  Invalid input(s): FREET3  Other results:   Imaging    No results found.   Medications:     Scheduled Medications: . amiodarone  200 mg Oral BID  . amLODipine  5 mg Oral Daily  . aspirin EC  81 mg Oral Daily  . atorvastatin  80 mg Oral Daily  . bisacodyl  10 mg Oral Daily  . carvedilol  6.25 mg Oral  BID WC  . Chlorhexidine Gluconate Cloth  6 each Topical Daily  . clopidogrel  75 mg Oral Daily  . colchicine  0.3 mg Oral BID  . dapagliflozin propanediol  10 mg Oral Daily  . docusate sodium  200 mg Oral Daily  . enoxaparin (LOVENOX) injection  40 mg Subcutaneous Q24H  . furosemide  40 mg Oral Daily  . gabapentin  100 mg Oral TID  . glipiZIDE  10 mg Oral BID AC  . guaiFENesin  600 mg Oral BID  . insulin aspart  0-24 Units Subcutaneous TID WC  . insulin detemir  18 Units Subcutaneous BID  . isosorbide-hydrALAZINE  1 tablet Oral TID  . pantoprazole  40 mg Oral Daily  . sacubitril-valsartan  1 tablet Oral BID  . spironolactone  25 mg Oral Daily    Infusions: . lactated ringers      PRN Medications: dextrose, lactated ringers,  lactulose, metoprolol tartrate, ondansetron (ZOFRAN) IV, oxyCODONE, sodium chloride flush, sodium chloride flush, traMADol   Assessment/Plan   1. CAD: S/P CABG x4 on 10/09/20. No chest pain.  - ASA 81 mg + Plavix 75 mg  - Atorvastatin 80 mg  2. Acute systolic CHF: Echo (8/18) with EF 35-40%, mild LVH (similar this admission and last).  Ischemic CM now s/p CABG. Repeat echo 3/3 LVEF improved 40-45%, RV mildly reduced. Now off milrinone. Co-ox 61%. CVP 9 today.  - Continue Farxiga 10 mg  - Continue spironolactone 25 mg daily.  - Continue Bidil 1 tab tid.  - Continue Entresto 24-26 mg bid.    - Continue Lasix 40 mg po daily  3. ID:  Concern for possible aspiration PNA pre-op.  CXR with bilateral infiltrates initially, now clearing with RUL infiltrate only (suspect she asymmetric edema or mucus plugging also). Afebrile. Cultures NGTD.  - On cefepime prior to surgery.  - Encouraged pulmonary toilet 4. Type 2 diabetes: Control improved recently. Hgb A1c 7.5 - Now on Farxiga.  5. VT: Noted in ER though strips not available.  - now on po amiodarone, will continue 1 month post-op then stop.    6. AKI: Resolved. Creatinine 1.1.   7. Left knee pain: Has ice pack on it, limiting ambulation.  - ?Gout with aggressive diuresis => sending uric acid level.   Transfer to step down.  Should be ready for home soon.   Loralie Champagne 10/18/2020 7:41 AM

## 2020-10-18 NOTE — Progress Notes (Signed)
Epicardial pacing wires d/c'd per protocol. Instructed patient to remain in the bed for 1 hour. Verbalized understanding.

## 2020-10-18 NOTE — Progress Notes (Signed)
Pt ambulated to the [facility] from the bed. Patient refused to walk this morning due to left knee pain.  Pt stated" my left knee is my good knee, my right leg has been my bad leg since I was 16. I can't walk today."  Pt medicated with pain medication. Encouraged patient to discuss knee and ambulation issues with physician and PT during rounds.

## 2020-10-18 NOTE — Progress Notes (Signed)
CARDIAC REHAB PHASE I   PRE:  Rate/Rhythm: 79 SR  MODE:  Ambulation: 30 ft   POST:  Rate/Rhythm: 82 SR  BP:  Sitting: 143/102    SaO2: 95 RA   Aided RN in pt transfer. Pt ambulated 62ft across room to recliner with front wheel walker. Pt states she feels much better today, knee pain improved with medication changes. Pt agreeable to sit in recliner. Encouraged continued ambulation and IS use. Will continue to follow.  [id number] Rufina Falco, RN BSN 10/18/2020 2:07 PM

## 2020-10-18 NOTE — Progress Notes (Signed)
Physical Therapy Treatment Patient Details Name: Tiffany Le MRN: [id number] DOB: Jan 06, 1976 Today's Date: 10/18/2020    History of Present Illness Pt is a 45 y.o. female recently admitted 2/14-2/17/22 with NSTEMI and LHC with severe CAD, recommended CABG vs. PCI, but left AMA, pt now readmitted 10/02/20 with respiratory distress; ETT 2/22-2/23. Pt with acute hypoxic respiratory failure likely secondary to acute cardiogenic pulmonary edema. S/p CABGx4 3/1. PMH includes HTN, DM2, CAD, HF.    PT Comments    Pt pleasant and reports limited mobility due to left knee pain that started yesterday. Pt premedicated and able to perform hall ambulation this session with return to bed and ice applied. Pt with bil LE weakness today with HEP and encouraged to continue throughout the day along with increased mobility with nursing. Pt remains adamant that she wants to get stronger for return home with full flight after initial stay at [facility] house. Will continue to follow.   Pre gait 142/67 (87) Post 154/99 (116) HR 82-86   Follow Up Recommendations  Home health PT;Supervision for mobility/OOB     Equipment Recommendations  Rolling walker with 5" wheels;3in1 (PT)    Recommendations for Other Services       Precautions / Restrictions Precautions Precautions: Fall;Sternal Precaution Comments: pt able to recall precautions Restrictions Weight Bearing Restrictions: Yes RUE Weight Bearing:  (sternal precautions) LUE Weight Bearing:  (sternal precautions)    Mobility  Bed Mobility Overal bed mobility: Needs Assistance Bed Mobility: Sit to Supine       Sit to supine: Mod assist   General bed mobility comments: assist to elevate legs back to bed with pt able to control trunk    Transfers Overall transfer level: Needs assistance   Transfers: Sit to/from Stand Sit to Stand: Supervision         General transfer comment: pt crossing arms on chest and able to stand from chair and bed  without physical assist  Ambulation/Gait Ambulation/Gait assistance: Min guard Gait Distance (Feet): 150 Feet Assistive device: Rolling walker (2 wheeled) Gait Pattern/deviations: Step-through pattern;Decreased stride length   Gait velocity interpretation: 1.31 - 2.62 ft/sec, indicative of limited community ambulator General Gait Details: pt with good stability with RW no variation in stance time despite knee pain. Pt with use of rW for stability on RA throughout   Stairs             Wheelchair Mobility    Modified Rankin (Stroke Patients Only)       Balance Overall balance assessment: Mild deficits observed, not formally tested                                          Cognition Arousal/Alertness: Awake/alert Behavior During Therapy: WFL for tasks assessed/performed Overall Cognitive Status: Within Functional Limits for tasks assessed                                        Exercises General Exercises - Lower Extremity Long Arc Quad: AROM;Both;15 reps;Seated Hip Flexion/Marching: AROM;Both;10 reps;Seated    General Comments        Pertinent Vitals/Pain Pain Score: 6  Pain Location: left knee Pain Descriptors / Indicators: Tender;Aching Pain Intervention(s): Limited activity within patient's tolerance;Monitored during session;Premedicated before session;Ice applied    Home Living  Prior Function            PT Goals (current goals can now be found in the care plan section) Progress towards PT goals: Progressing toward goals    Frequency    Min 3X/week      PT Plan Current plan remains appropriate    Co-evaluation              AM-PAC PT "6 Clicks" Mobility   Outcome Measure  Help needed turning from your back to your side while in a flat bed without using bedrails?: A Little Help needed moving from lying on your back to sitting on the side of a flat bed without using  bedrails?: A Little Help needed moving to and from a bed to a chair (including a wheelchair)?: A Little Help needed standing up from a chair using your arms (e.g., wheelchair or bedside chair)?: A Little Help needed to walk in hospital room?: A Little Help needed climbing 3-5 steps with a railing? : A Little 6 Click Score: 18    End of Session Equipment Utilized During Treatment: Gait belt Activity Tolerance: Patient tolerated treatment well Patient left: in bed;with call bell/phone within reach Nurse Communication: Mobility status PT Visit Diagnosis: Other abnormalities of gait and mobility (R26.89);Muscle weakness (generalized) (M62.81)     Time: 2878-6767 PT Time Calculation (min) (ACUTE ONLY): 32 min  Charges:  $Gait Training: 8-22 mins $Therapeutic Exercise: 8-22 mins                     Miette Molenda P, PT Acute Rehabilitation Services Pager: [phone] Office: [place] 10/18/2020, 10:26 AM

## 2020-10-18 NOTE — TOC Progression Note (Addendum)
Transition of Care [facility]) - Progression Note    Patient Details  Name: Tiffany Le MRN: [id number] Date of Birth: [birth year]  [facility] CM/SW Contact  Graves-Bigelow, Ocie Cornfield, RN Phone Number: 10/18/2020, 11:34 AM  Clinical Narrative:   Case Manager received a call from the provider regarding TOC needs. Case Manager has the patient set up with [facility] for home health services-PT/OT-orders are in [place]. [facility] is aware that the patient will transition home home Friday. Patient is in need of durable medical equipment (DME) rolling walker and bedside commode. Referral made with [facility] for [facility] DME and items will be delivered to the room prior to transition home. Pt is currently on colchicine- this medication will be expensive for the patient if she continues on it outpatient. Case Manager has scheduled hospital follow up appointment with the Patient [place] and she will be able to utilize the [facility] onsitePharmacy and cost ranges from $4.00-$10.00. Appointment will be placed in the AVS. Case Manager will follow for MATCH needs on day of discharge. Case Manager will continue to follow for additional toc needs.   Expected Discharge Plan: [place] Barriers to Discharge: No Barriers Identified  Expected Discharge Plan and Services Expected Discharge Plan: [place] In-house Referral: NA Discharge Planning Services: CM Consult Post Acute Care Choice: [place] Living arrangements for the past 2 months: Apartment                 DME Arranged: [place]ford Rile rolling,Bedside commode DME Agency: [facility] Date DME Agency Contacted: 10/18/20 Time DME Agency Contacted: 1134 Representative spoke with at DME Agency: [place]: PT,OT [place] Agency: [place] ([place]) Date [place]: 10/10/20 Time [place]: 1400 Representative spoke with at [place]: [place] Determinants of Health ([place]) Interventions Financial Strain Interventions: Other (Comment) (pt. reports she would like to file for disability, and has no health insurance, and she has to pay out of pocket for all her meds, and doesn't have food stamps and has to walk up steps to her apartment with no elevator) Housing Interventions: Intervention Not Indicated Stress Interventions: Intervention Not Indicated Transportation Interventions: [facility]  Readmission Risk Interventions Readmission Risk Prevention Plan 10/11/2020  Transportation Screening Complete  HRI or [place] Complete  Social Work Consult for [place] Planning/Counseling Complete  Palliative Care Screening Not Applicable  Medication Review Press photographer) Complete  Some recent data might be hidden

## 2020-10-19 ENCOUNTER — Other Ambulatory Visit: Payer: Self-pay | Admitting: Physician Assistant

## 2020-10-19 ENCOUNTER — Other Ambulatory Visit: Payer: Self-pay | Admitting: Cardiothoracic Surgery

## 2020-10-19 LAB — CBC
HCT: 28 % — ABNORMAL LOW (ref 36.0–46.0)
Hemoglobin: 8.8 g/dL — ABNORMAL LOW (ref 12.0–15.0)
MCH: 26.7 pg (ref 26.0–34.0)
MCHC: 31.4 g/dL (ref 30.0–36.0)
MCV: 85.1 fL (ref 80.0–100.0)
Platelets: 394 10*3/uL (ref 150–400)
RBC: 3.29 MIL/uL — ABNORMAL LOW (ref 3.87–5.11)
RDW: 14.1 % (ref 11.5–15.5)
WBC: 11.6 10*3/uL — ABNORMAL HIGH (ref 4.0–10.5)
nRBC: 0 % (ref 0.0–0.2)

## 2020-10-19 LAB — COOXEMETRY PANEL
Carboxyhemoglobin: 1.6 % — ABNORMAL HIGH (ref 0.5–1.5)
Methemoglobin: 1 % (ref 0.0–1.5)
O2 Saturation: 60.8 %
Total hemoglobin: 9 g/dL — ABNORMAL LOW (ref 12.0–16.0)

## 2020-10-19 LAB — BASIC METABOLIC PANEL
Anion gap: 7 (ref 5–15)
BUN: 32 mg/dL — ABNORMAL HIGH (ref 6–20)
CO2: 30 mmol/L (ref 22–32)
Calcium: 8.4 mg/dL — ABNORMAL LOW (ref 8.9–10.3)
Chloride: 98 mmol/L (ref 98–111)
Creatinine, Ser: 1.19 mg/dL — ABNORMAL HIGH (ref 0.44–1.00)
GFR, Estimated: 58 mL/min — ABNORMAL LOW (ref >60)
Glucose, Bld: 119 mg/dL — ABNORMAL HIGH (ref 70–99)
Potassium: 4.7 mmol/L (ref 3.5–5.1)
Sodium: 135 mmol/L (ref 135–145)

## 2020-10-19 LAB — GLUCOSE, CAPILLARY
Glucose-Capillary: 104 mg/dL — ABNORMAL HIGH (ref 70–99)
Glucose-Capillary: 92 mg/dL (ref 70–99)
Glucose-Capillary: 124 mg/dL — ABNORMAL HIGH (ref 70–99)
Glucose-Capillary: 185 mg/dL — ABNORMAL HIGH (ref 70–99)

## 2020-10-19 MED ORDER — OXYCODONE HCL 5 MG PO TABS: 5.0000 mg | ORAL_TABLET | ORAL | 0 refills | Status: DC | PRN

## 2020-10-19 MED ORDER — CARVEDILOL 12.5 MG PO TABS: 12.5000 mg | ORAL_TABLET | Freq: Two times a day (BID) | ORAL | 3 refills | Status: DC

## 2020-10-19 MED ORDER — ISOSORB DINITRATE-HYDRALAZINE 20-37.5 MG PO TABS: 1.0000 | ORAL_TABLET | Freq: Three times a day (TID) | ORAL | 3 refills | Status: DC

## 2020-10-19 MED ORDER — METOCLOPRAMIDE HCL 5 MG/ML IJ SOLN
10.0000 mg | Freq: Four times a day (QID) | INTRAMUSCULAR | Status: AC
  Administered 2020-10-19 – 2020-10-20 (×4): 10 mg via INTRAVENOUS
  Filled 2020-10-19 (×5): qty 2

## 2020-10-19 MED ORDER — SPIRONOLACTONE 25 MG PO TABS: 25.0000 mg | ORAL_TABLET | Freq: Every day | ORAL | 3 refills | Status: DC

## 2020-10-19 MED ORDER — DAPAGLIFLOZIN PROPANEDIOL 10 MG PO TABS: 10.0000 mg | ORAL_TABLET | Freq: Every day | ORAL | 3 refills | Status: DC

## 2020-10-19 MED ORDER — CLOPIDOGREL BISULFATE 75 MG PO TABS: 75.0000 mg | ORAL_TABLET | Freq: Every day | ORAL | 3 refills | Status: DC

## 2020-10-19 MED ORDER — SACUBITRIL-VALSARTAN 49-51 MG PO TABS: 1.0000 | ORAL_TABLET | Freq: Two times a day (BID) | ORAL | 3 refills | Status: DC

## 2020-10-19 MED ORDER — FUROSEMIDE 40 MG PO TABS: 40.0000 mg | ORAL_TABLET | Freq: Every day | ORAL | 3 refills | Status: DC

## 2020-10-19 MED ORDER — SACUBITRIL-VALSARTAN 49-51 MG PO TABS
1.0000 | ORAL_TABLET | Freq: Two times a day (BID) | ORAL | Status: DC
  Administered 2020-10-19 – 2020-10-27 (×17): 1 via ORAL
  Filled 2020-10-19 (×18): qty 1

## 2020-10-19 MED FILL — oxyCODONE HCL 5 MG TABS: 5 | 5 days supply | Qty: 30 | Fill #0

## 2020-10-19 MED FILL — ENTRESTO 49 MG-51 MG TABLET: 49-51 | 20 days supply | Qty: 60 | Fill #0

## 2020-10-19 MED FILL — FARXIGA 10 MG TABLET: 10 | 30 days supply | Qty: 30 | Fill #0

## 2020-10-19 MED FILL — CARVEDILOL 12.5 MG TABLET: 12.5 | 30 days supply | Qty: 60 | Fill #0

## 2020-10-19 MED FILL — SPIRONOLACTONE 25 MG TABLET: 25 | 30 days supply | Qty: 30 | Fill #0

## 2020-10-19 MED FILL — BIDIL 20-37.5 MG TABS: 20-37.5 | 3 days supply | Qty: 8 | Fill #0

## 2020-10-19 MED FILL — CLOPIDOGREL 75 MG TABLET: 75 | 30 days supply | Qty: 30 | Fill #0

## 2020-10-19 MED FILL — FUROSEMIDE 40 MG TABLET: 40 | 30 days supply | Qty: 30 | Fill #0

## 2020-10-19 MED FILL — BIDIL 20-37.5 MG TABS: 20-37.5 | 30 days supply | Qty: 90 | Fill #0

## 2020-10-19 NOTE — Progress Notes (Signed)
CARDIAC REHAB PHASE I   Offered to walk with pt. Pt c/o dizziness after sitting up to sign papers. Currently trying to eat some lunch and take pills. Stressed importance of continued sternal precautions, IS use, and ambulation throughout the weekend. Pt currently demonstrating 250 on IS. Pt states she will ask nurse to walk with her later today. Will continue to follow.  [id number] Rufina Falco, RN BSN 10/19/2020 2:38 PM

## 2020-10-19 NOTE — Plan of Care (Signed)

## 2020-10-19 NOTE — Progress Notes (Addendum)
[address]       [place] [address]             [phone]      10 Days Post-Op Procedure(s) (LRB): CORONARY ARTERY BYPASS GRAFTING (CABG), ON PUMP, TIMES FOUR, USING LEFT INTERNAL MAMMARY ARTERY AND LEFT RADIAL ARTERY (OPEN HARVEST) (N/A) RADIAL ARTERY HARVEST (Left) TRANSESOPHAGEAL ECHOCARDIOGRAM (TEE) (N/A) INDOCYANINE GREEN FLUORESCENCE IMAGING (ICG) (N/A) CLIPPING OF ATRIAL APPENDAGE USING ATRICURE 35MM CLIP (N/A)   Subjective:  Patient had a rough night.  States she has been throwing up.  Her nurse states its happening anytime she gets up.  She has moved her bowels.  The patient is going to try and eat this morning.  Objective: Vital signs in last 24 hours: Temp:  [97.7 F (36.5 C)-98.4 F (36.9 C)] 98.4 F (36.9 C) (03/11 0355) Pulse Rate:  [74-82] 75 (03/11 0355) Cardiac Rhythm: Normal sinus rhythm (03/11 0705) Resp:  [9-21] 18 (03/11 0355) BP: (110-148)/(59-102) 117/62 (03/11 0355) SpO2:  [93 %-99 %] 94 % (03/11 0355) Weight:  [112.5 kg] 112.5 kg (03/11 0445)  Intake/Output from previous day: 03/10 0701 - 03/11 0700 In: 360 [P.O.:360] Out: 1225 [Urine:1225]  General appearance: alert, cooperative and no distress Heart: regular rate and rhythm Lungs: clear to auscultation bilaterally Abdomen: soft, non-tender; bowel sounds normal; no masses,  no organomegaly Extremities: edema trace Wound: clean and dry  Lab Results: Recent Labs    10/18/20 0347 10/19/20 0420  WBC 10.9* 11.6*  HGB 8.8* 8.8*  HCT 28.1* 28.0*  PLT 490* 394   BMET:  Recent Labs    10/18/20 0347 10/19/20 0420  NA 136 135  K 3.8 4.7  CL 96* 98  CO2 32 30  GLUCOSE 119* 119*  BUN 33* 32*  CREATININE 1.17* 1.19*  CALCIUM 8.5* 8.4*    PT/INR: No results for input(s): LABPROT, INR in the last 72 hours. ABG    Component Value Date/Time   PHART 7.364 10/09/2020 2158   HCO3 23.9 10/09/2020 2158   TCO2 25 10/09/2020 2158   ACIDBASEDEF 1.0 10/09/2020 2158    O2SAT 60.8 10/19/2020 0420   CBG (last 3)  Recent Labs    10/18/20 1602 10/18/20 2121 10/19/20 0629  GLUCAP 119* 129* 104*    Assessment/Plan: S/P Procedure(s) (LRB): CORONARY ARTERY BYPASS GRAFTING (CABG), ON PUMP, TIMES FOUR, USING LEFT INTERNAL MAMMARY ARTERY AND LEFT RADIAL ARTERY (OPEN HARVEST) (N/A) RADIAL ARTERY HARVEST (Left) TRANSESOPHAGEAL ECHOCARDIOGRAM (TEE) (N/A) INDOCYANINE GREEN FLUORESCENCE IMAGING (ICG) (N/A) CLIPPING OF ATRIAL APPENDAGE USING ATRICURE 35MM CLIP (N/A)  1. CV- NSR, BP stable-  On Amiodarone 200 mg BID, Norvasc, Coreg, Bidil, Entresto 2. Pulm- no acute issues, continue IS 3. Renal- creatinine stable, weight is trending down on Lasix and Spironolactone per AHF 4. GI- N/V, could be related to Amiodarone/Colchcine, she has moved her bowels, not much relief with zofran, will schedule reglan for a few doses to see if this helps 5. DM- sugars well controlled, continue Farxiga, glipizide, and insulin 6. Deconditioning- Home arrangements have been made 7. Dispo- patient hemodynamically stable in NSR, now with N/V overnight, add reglan to see if we can get her relief, ideally would be great if we can finalize medications today as she will need meds from [facility] who will be closed tomorrow.. if can finalize today they can fill today, will tentatively plan for discharge in the AM however patients mobility is limited by gout currently, she isn't up and ambulating  well.  Discussed with AHF and its felt patient will likely require stay through the weekend   LOS: 17 days    Ellwood Handler, PA-C 10/19/2020

## 2020-10-19 NOTE — Progress Notes (Signed)
Physical Therapy Treatment Patient Details Name: Tiffany Le MRN: [id number] DOB: [birth year] Today's Date: 10/19/2020    History of Present Illness Pt is a 45 y.o. female recently admitted 2/14-2/17/22 with NSTEMI and LHC with severe CAD, recommended CABG vs. PCI, but left AMA, pt now readmitted 10/02/20 with respiratory distress; ETT 2/22-2/23. Pt with acute hypoxic respiratory failure likely secondary to acute cardiogenic pulmonary edema. S/p CABGx4 3/1. PMH includes HTN, DM2, CAD, HF.    PT Comments    Pt pleasant and willing to mobilize with report of continued left knee pain and pt also stating she hasn't eaten for 36hrs while taking medication. Pt educated for the need to eat especially with pain medicine on board. Pt limited in all aspects of mobility by dizziness in standing today and only able to achieve 30' of gait. BP 150/107 (121) seated in chair immediately after dizziness with gait. HR 73-77. Pt educated for HEP and need to continue to progress mobility with staff assist. Pt provided graham crackers and consumed prior to standing and ordered lunch.     Follow Up Recommendations  Home health PT;Supervision for mobility/OOB     Equipment Recommendations  Rolling walker with 5" wheels;3in1 (PT)    Recommendations for Other Services       Precautions / Restrictions Precautions Precautions: Fall;Sternal Precaution Booklet Issued: Yes (comment)    Mobility  Bed Mobility               General bed mobility comments: in chair on arrival and end of session    Transfers Overall transfer level: Needs assistance   Transfers: Sit to/from Stand Sit to Stand: Supervision         General transfer comment: pt crossing arms on chest and able to stand from chair and BSC without physical assist total of 5 trials this session  Ambulation/Gait Ambulation/Gait assistance: Min guard Gait Distance (Feet): 30 Feet Assistive device: Rolling walker (2 wheeled) Gait  Pattern/deviations: Step-through pattern;Decreased stride length   Gait velocity interpretation: 1.31 - 2.62 ft/sec, indicative of limited community ambulator General Gait Details: pt with cues for posture and limited by dizziness with VSS and no drop in BP. Pt walked 10' from recliner to [facility] then 30' from [facility] into hallway where she required a chair pulled to her due to dizziness. Pt attempted to stand and walk again but only made it 1 step then ultimately had to ride in chair back to room prior to being able to finally stand and walk additional 4' to recliner   Stairs             Wheelchair Mobility    Modified Rankin (Stroke Patients Only)       Balance Overall balance assessment: Mild deficits observed, not formally tested Sitting-balance support: No upper extremity supported;Feet supported Sitting balance-Leahy Scale: Fair     Standing balance support: Bilateral upper extremity supported Standing balance-Leahy Scale: Fair Standing balance comment: fair static standing                            Cognition Arousal/Alertness: Awake/alert Behavior During Therapy: Flat affect Overall Cognitive Status: Within Functional Limits for tasks assessed                                        Exercises General Exercises - Lower Extremity Long Arc Quad: AROM;Both;15  reps;Seated Hip Flexion/Marching: AROM;Both;Seated;15 reps    General Comments        Pertinent Vitals/Pain Pain Score: 5  Pain Location: left knee Pain Descriptors / Indicators: Tender;Aching Pain Intervention(s): Limited activity within patient's tolerance;Monitored during session;Premedicated before session    Home Living                      Prior Function            PT Goals (current goals can now be found in the care plan section) Progress towards PT goals: Not progressing toward goals - comment (limited by dizziness)    Frequency    Min 3X/week      PT  Plan Current plan remains appropriate    Co-evaluation              AM-PAC PT "6 Clicks" Mobility   Outcome Measure  Help needed turning from your back to your side while in a flat bed without using bedrails?: A Little Help needed moving from lying on your back to sitting on the side of a flat bed without using bedrails?: A Little Help needed moving to and from a bed to a chair (including a wheelchair)?: A Little Help needed standing up from a chair using your arms (e.g., wheelchair or bedside chair)?: A Little Help needed to walk in hospital room?: A Little Help needed climbing 3-5 steps with a railing? : A Little 6 Click Score: 18    End of Session   Activity Tolerance: Treatment limited secondary to medical complications (Comment) (dizziness) Patient left: in chair;with call bell/phone within reach Nurse Communication: Mobility status PT Visit Diagnosis: Other abnormalities of gait and mobility (R26.89);Muscle weakness (generalized) (M62.81)     Time: 1610-9604 PT Time Calculation (min) (ACUTE ONLY): 51 min  Charges:  $Gait Training: 8-22 mins $Therapeutic Exercise: 8-22 mins $Therapeutic Activity: 8-22 mins                     Sydnie Sigmund P, PT Acute Rehabilitation Services Pager: [phone] Office: [place] 10/19/2020, 11:56 AM

## 2020-10-19 NOTE — Plan of Care (Signed)
  Problem: Education: Goal: Knowledge of General Education information will improve Description: Including pain rating scale, medication(s)/side effects and non-pharmacologic comfort measures Outcome: Progressing   Problem: Health Behavior/Discharge Planning: Goal: Ability to manage health-related needs will improve Outcome: Progressing   Problem: Clinical Measurements: Goal: Ability to maintain clinical measurements within normal limits will improve Outcome: Progressing Goal: Will remain free from infection Outcome: Progressing Goal: Diagnostic test results will improve Outcome: Progressing Goal: Respiratory complications will improve Outcome: Progressing Goal: Cardiovascular complication will be avoided Outcome: Progressing   Problem: Activity: Goal: Risk for activity intolerance will decrease Outcome: Progressing   Problem: Coping: Goal: Level of anxiety will decrease Outcome: Progressing   Problem: Elimination: Goal: Will not experience complications related to bowel motility Outcome: Progressing Goal: Will not experience complications related to urinary retention Outcome: Progressing   Problem: Education: Goal: Ability to demonstrate management of disease process will improve Outcome: Progressing Goal: Ability to verbalize understanding of medication therapies will improve Outcome: Progressing Goal: Individualized Educational Video(s) Outcome: Progressing   Problem: Activity: Goal: Capacity to carry out activities will improve Outcome: Progressing   Problem: Cardiac: Goal: Ability to achieve and maintain adequate cardiopulmonary perfusion will improve Outcome: Progressing   Problem: Education: Goal: Will demonstrate proper wound care and an understanding of methods to prevent future damage Outcome: Progressing Goal: Knowledge of disease or condition will improve Outcome: Progressing Goal: Knowledge of the prescribed therapeutic regimen will  improve Outcome: Progressing Goal: Individualized Educational Video(s) Outcome: Progressing   Problem: Activity: Goal: Risk for activity intolerance will decrease Outcome: Progressing   Problem: Cardiac: Goal: Will achieve and/or maintain hemodynamic stability Outcome: Progressing   Problem: Clinical Measurements: Goal: Postoperative complications will be avoided or minimized Outcome: Progressing   Problem: Respiratory: Goal: Respiratory status will improve Outcome: Progressing   Problem: Skin Integrity: Goal: Wound healing without signs and symptoms of infection Outcome: Progressing Goal: Risk for impaired skin integrity will decrease Outcome: Progressing   Problem: Urinary Elimination: Goal: Ability to achieve and maintain adequate renal perfusion and functioning will improve Outcome: Progressing

## 2020-10-19 NOTE — Progress Notes (Addendum)
Patient ID: Tiffany Le, female   DOB: 09/16/1975, 45 y.o.   MRN: [id number]     Advanced Heart Failure Rounding Note  PCP-Cardiologist: Quay Burow, MD   Subjective:    10/09/20 S/P CABG x4  Left Radial Harvest 3/8 s/p left thoracentesis w/ removal of 100 mL light red fluid.  Co-ox stable off milrinone, 61%.   Complaining of right knee pain and nausea.   Echo post-op EF slightly better 40-45%. RV mildly reduced.   Objective:   Weight Range: 112.5 kg Body mass index is 42.57 kg/m.   Vital Signs:   Temp:  [97.7 F (36.5 C)-98.4 F (36.9 C)] 98.4 F (36.9 C) (03/11 0355) Pulse Rate:  [74-82] 75 (03/11 0355) Resp:  [9-21] 18 (03/11 0355) BP: (110-148)/(59-102) 117/62 (03/11 0355) SpO2:  [93 %-99 %] 94 % (03/11 0355) Weight:  [112.5 kg] 112.5 kg (03/11 0445) Last BM Date: 10/18/20  Weight change: Filed Weights   10/17/20 0452 10/18/20 0600 10/19/20 0445  Weight: 113 kg 112.8 kg 112.5 kg    Intake/Output:   Intake/Output Summary (Last 24 hours) at 10/19/2020 0731 Last data filed at 10/19/2020 0445 Gross per 24 hour  Intake 360 ml  Output 1225 ml  Net -865 ml      Physical Exam  CVP 6-7  General:  No resp difficulty HEENT: normal Neck: supple. JVP 6-7 . Carotids 2+ bilat; no bruits. No lymphadenopathy or thryomegaly appreciated. Cor: PMI nondisplaced. Regular rate & rhythm. No rubs, gallops or murmurs. Incisional VAC in place.  Lungs: clear Abdomen: soft, nontender, nondistended. No hepatosplenomegaly. No bruits or masses. Good bowel sounds. Extremities: no cyanosis, clubbing, rash, R and LLE trace edema.  Neuro: alert & orientedx3, cranial nerves grossly intact. moves all 4 extremities w/o difficulty. Affect pleasant   Telemetry   SR 80s no VT.   Labs    CBC Recent Labs    10/18/20 0347 10/19/20 0420  WBC 10.9* 11.6*  HGB 8.8* 8.8*  HCT 28.1* 28.0*  MCV 85.2 85.1  PLT 490* 500   Basic Metabolic Panel Recent Labs    10/18/20 0347  10/19/20 0420  NA 136 135  K 3.8 4.7  CL 96* 98  CO2 32 30  GLUCOSE 119* 119*  BUN 33* 32*  CREATININE 1.17* 1.19*  CALCIUM 8.5* 8.4*   Liver Function Tests No results for input(s): AST, ALT, ALKPHOS, BILITOT, PROT, ALBUMIN in the last 72 hours. No results for input(s): LIPASE, AMYLASE in the last 72 hours. Cardiac Enzymes No results for input(s): CKTOTAL, CKMB, CKMBINDEX, TROPONINI in the last 72 hours.  BNP: BNP (last 3 results) Recent Labs    10/02/20 0516  BNP 947.1*    ProBNP (last 3 results) No results for input(s): PROBNP in the last 8760 hours.   D-Dimer No results for input(s): DDIMER in the last 72 hours. Hemoglobin A1C No results for input(s): HGBA1C in the last 72 hours. Fasting Lipid Panel No results for input(s): CHOL, HDL, LDLCALC, TRIG, CHOLHDL, LDLDIRECT in the last 72 hours. Thyroid Function Tests No results for input(s): TSH, T4TOTAL, T3FREE, THYROIDAB in the last 72 hours.  Invalid input(s): FREET3  Other results:   Imaging    No results found.   Medications:     Scheduled Medications: . amiodarone  200 mg Oral BID  . amLODipine  5 mg Oral Daily  . aspirin EC  81 mg Oral Daily  . atorvastatin  80 mg Oral Daily  . bisacodyl  10 mg Oral  Daily  . carvedilol  12.5 mg Oral BID WC  . Chlorhexidine Gluconate Cloth  6 each Topical Daily  . clopidogrel  75 mg Oral Daily  . colchicine  0.6 mg Oral BID  . dapagliflozin propanediol  10 mg Oral Daily  . docusate sodium  200 mg Oral Daily  . enoxaparin (LOVENOX) injection  40 mg Subcutaneous Q24H  . furosemide  40 mg Oral Daily  . gabapentin  100 mg Oral TID  . glipiZIDE  10 mg Oral BID AC  . insulin aspart  0-24 Units Subcutaneous TID WC  . insulin detemir  18 Units Subcutaneous BID  . isosorbide-hydrALAZINE  1 tablet Oral TID  . metoCLOPramide (REGLAN) injection  10 mg Intravenous Q6H  . pantoprazole  40 mg Oral Daily  . sacubitril-valsartan  1 tablet Oral BID  . spironolactone  25 mg  Oral Daily    Infusions: . lactated ringers      PRN Medications: dextrose, lactated ringers, lactulose, metoprolol tartrate, ondansetron (ZOFRAN) IV, oxyCODONE, sodium chloride flush, sodium chloride flush, traMADol   Assessment/Plan   1. CAD: S/P CABG x4 on 10/09/20. No chest pain.  - ASA 81 mg + Plavix 75 mg  - Atorvastatin 80 mg  2. Acute systolic CHF: Echo (0/63) with EF 35-40%, mild LVH (similar this admission and last).  Ischemic CM now s/p CABG. Repeat echo 3/3 LVEF improved 40-45%, RV mildly reduced. Now off milrinone. Co-ox 61%. CVP 9 today.  - CVP 6-7. Volume status stable. CO-OX stable.  - Continue coreg at 12.5 mg twice a day.  - Continue Farxiga 10 mg  - Continue spironolactone 25 mg daily.  - Continue Bidil 1 tab tid.  - Increase 49-51 mg twice a day.  - Stop amlodipine.     - Continue Lasix 40 mg po daily . 3. ID:  Concern for possible aspiration PNA pre-op.  CXR with bilateral infiltrates initially, now clearing with RUL infiltrate only (suspect she asymmetric edema or mucus plugging also). Afebrile. Cultures NGTD.  - On cefepime prior to surgery.  - Encouraged pulmonary toilet 4. Type 2 diabetes: Control improved recently. Hgb A1c 7.5 - Now on Farxiga.  5. VT: Noted in ER though strips not available.  -No VT noted. Stop amiodarone.      6. AKI: Resolved. Creatinine 2  7. Acute Gout Uric acid 9.1.  On colchicine.    Ambulate as tolerated.    Amy Clegg NP-C  10/19/2020 7:31 AM  Patient seen with NP, agree with the above note.   Main complaints today are right knee pain (presumed from gout) and nausea.  BP stable, no dyspnea.   General: NAD Neck: No JVD, no thyromegaly or thyroid nodule.  Lungs: Clear to auscultation bilaterally with normal respiratory effort. CV: Nondisplaced PMI.  Heart regular S1/S2, no S3/S4, no murmur.  No peripheral edema.  Abdomen: Soft, nontender, no hepatosplenomegaly, no distention.  Skin: Intact without lesions or rashes.   Neurologic: Alert and oriented x 3.  Psych: Normal affect. Extremities: No clubbing or cyanosis.  HEENT: Normal.   Today will stop amlodipine, increase Entresto to 49/51 bid.   No VT noted in hospital, was only seen reportedly in ER but no strips available.  Has now had CABG and is nauseated.  - Stop amiodarone.   Continue current Lasix.   Can use tramadol for knee pain.  On colchicine.  Would avoid prednisone with recent surgery.   Hopefully home soon.  Will make CHF followup.  Loralie Champagne 10/19/2020 10:50 AM

## 2020-10-20 DIAGNOSIS — M1000 Idiopathic gout, unspecified site: Secondary | ICD-10-CM

## 2020-10-20 LAB — CBC
HCT: 29 % — ABNORMAL LOW (ref 36.0–46.0)
Hemoglobin: 9.3 g/dL — ABNORMAL LOW (ref 12.0–15.0)
MCH: 27.4 pg (ref 26.0–34.0)
MCHC: 32.1 g/dL (ref 30.0–36.0)
MCV: 85.5 fL (ref 80.0–100.0)
Platelets: 503 10*3/uL — ABNORMAL HIGH (ref 150–400)
RBC: 3.39 MIL/uL — ABNORMAL LOW (ref 3.87–5.11)
RDW: 14.2 % (ref 11.5–15.5)
WBC: 11.8 10*3/uL — ABNORMAL HIGH (ref 4.0–10.5)
nRBC: 0 % (ref 0.0–0.2)

## 2020-10-20 LAB — BASIC METABOLIC PANEL
Anion gap: 7 (ref 5–15)
BUN: 34 mg/dL — ABNORMAL HIGH (ref 6–20)
CO2: 30 mmol/L (ref 22–32)
Calcium: 8.5 mg/dL — ABNORMAL LOW (ref 8.9–10.3)
Chloride: 97 mmol/L — ABNORMAL LOW (ref 98–111)
Creatinine, Ser: 1.29 mg/dL — ABNORMAL HIGH (ref 0.44–1.00)
GFR, Estimated: 52 mL/min — ABNORMAL LOW (ref >60)
Glucose, Bld: 155 mg/dL — ABNORMAL HIGH (ref 70–99)
Potassium: 4.6 mmol/L (ref 3.5–5.1)
Sodium: 134 mmol/L — ABNORMAL LOW (ref 135–145)

## 2020-10-20 LAB — GLUCOSE, CAPILLARY
Glucose-Capillary: 130 mg/dL — ABNORMAL HIGH (ref 70–99)
Glucose-Capillary: 185 mg/dL — ABNORMAL HIGH (ref 70–99)
Glucose-Capillary: 118 mg/dL — ABNORMAL HIGH (ref 70–99)
Glucose-Capillary: 145 mg/dL — ABNORMAL HIGH (ref 70–99)

## 2020-10-20 LAB — COOXEMETRY PANEL
Carboxyhemoglobin: 1.1 % (ref 0.5–1.5)
Methemoglobin: 1 % (ref 0.0–1.5)
O2 Saturation: 60.5 %
Total hemoglobin: 10.8 g/dL — ABNORMAL LOW (ref 12.0–16.0)

## 2020-10-20 MED ORDER — FUROSEMIDE 10 MG/ML IJ SOLN
80.0000 mg | Freq: Two times a day (BID) | INTRAMUSCULAR | Status: AC
  Administered 2020-10-20 (×2): 80 mg via INTRAVENOUS
  Filled 2020-10-20 (×2): qty 8

## 2020-10-20 MED ORDER — MECLIZINE HCL 25 MG PO TABS
25.0000 mg | ORAL_TABLET | Freq: Two times a day (BID) | ORAL | Status: DC | PRN
  Administered 2020-10-24: 25 mg via ORAL
  Filled 2020-10-20 (×2): qty 1

## 2020-10-20 MED ORDER — ASPIRIN 81 MG PO CHEW
81.0000 mg | CHEWABLE_TABLET | Freq: Every day | ORAL | Status: DC
  Administered 2020-10-21 – 2020-10-27 (×7): 81 mg via ORAL
  Filled 2020-10-20 (×7): qty 1

## 2020-10-20 MED ORDER — PANTOPRAZOLE SODIUM 40 MG PO PACK
40.0000 mg | PACK | Freq: Every day | ORAL | Status: DC
  Administered 2020-10-21: 40 mg
  Filled 2020-10-20 (×2): qty 20

## 2020-10-20 MED ORDER — COLCHICINE 0.6 MG PO TABS
0.6000 mg | ORAL_TABLET | Freq: Two times a day (BID) | ORAL | Status: DC
  Administered 2020-10-20 – 2020-10-27 (×15): 0.6 mg via ORAL
  Filled 2020-10-20 (×17): qty 1

## 2020-10-20 NOTE — Progress Notes (Signed)
Physical Therapy Treatment Patient Details Name: Tiffany Le MRN: [id number] DOB: January 17, 1976 Today's Date: 10/20/2020    History of Present Illness Pt is a 45 y.o. female recently admitted 2/14-2/17/22 with NSTEMI and LHC with severe CAD, recommended CABG vs. PCI, but left AMA, pt now readmitted 10/02/20 with respiratory distress; ETT 2/22-2/23. Pt with acute hypoxic respiratory failure likely secondary to acute cardiogenic pulmonary edema. S/p CABGx4 3/1. PMH includes HTN, DM2, CAD, HF.    PT Comments    Pt tolerates treatment well with improved ambulation distance and without reports of dizziness. Pt continues to require physical assistance to perform bed mobility and intermittently to transfer. Pt expresses concern about mobilizing at home due to limited assistance, preferring to discharge to short term rehab. PT updates recommendations to SNF at this time as the pt continues to require physical assistance and has not consistently been able to mobilize for household distances without dizziness or physical assist.    Follow Up Recommendations  SNF     Equipment Recommendations  Rolling walker with 5" wheels;3in1 (PT)    Recommendations for Other Services       Precautions / Restrictions Precautions Precautions: Fall;Sternal Precaution Booklet Issued: Yes (comment) Precaution Comments: pt able to recall precautions Restrictions Weight Bearing Restrictions: Yes Other Position/Activity Restrictions: sternal precautions    Mobility  Bed Mobility Overal bed mobility: Needs Assistance Bed Mobility: Supine to Sit     Supine to sit: Mod assist;HOB elevated     General bed mobility comments: pt requires assistance to elevate trunk into sitting    Transfers Overall transfer level: Needs assistance Equipment used: Rolling walker (2 wheeled) Transfers: Sit to/from Stand Sit to Stand: Min assist;Min guard         General transfer comment: pt requires assist to power up  from bed  Ambulation/Gait Ambulation/Gait assistance: Min guard Gait Distance (Feet): 60 Feet Assistive device: Rolling walker (2 wheeled) Gait Pattern/deviations: Step-to pattern Gait velocity: slowed Gait velocity interpretation: <1.31 ft/sec, indicative of household ambulator General Gait Details: pt with slowed step-to gait, often taking 1-2 steps and then stopping to slow Public house manager    Modified Rankin (Stroke Patients Only)       Balance Overall balance assessment: Needs assistance Sitting-balance support: No upper extremity supported;Feet supported Sitting balance-Leahy Scale: Fair     Standing balance support: Bilateral upper extremity supported Standing balance-Leahy Scale: Poor Standing balance comment: reliant on UE support of RW                            Cognition Arousal/Alertness: Awake/alert Behavior During Therapy: WFL for tasks assessed/performed Overall Cognitive Status: Within Functional Limits for tasks assessed                                        Exercises      General Comments General comments (skin integrity, edema, etc.): pt on 2L [place] upon arrival. PT weans to room air, pt sats 92% and up with mobility. BP stable, pt does not report lightheadedness or dizziness      Pertinent Vitals/Pain Pain Assessment: Faces Faces Pain Scale: Hurts little more Pain Location: LUE Pain Descriptors / Indicators: Sore Pain Intervention(s): Monitored during session    Home Living  Prior Function            PT Goals (current goals can now be found in the care plan section) Acute Rehab PT Goals Patient Stated Goal: go to rehab to regain independence Progress towards PT goals: Progressing toward goals (slowly)    Frequency    Min 3X/week (maintain 3x despite SNF)      PT Plan Current plan remains appropriate    Co-evaluation               AM-PAC PT "6 Clicks" Mobility   Outcome Measure  Help needed turning from your back to your side while in a flat bed without using bedrails?: A Little Help needed moving from lying on your back to sitting on the side of a flat bed without using bedrails?: A Lot Help needed moving to and from a bed to a chair (including a wheelchair)?: A Little Help needed standing up from a chair using your arms (e.g., wheelchair or bedside chair)?: A Little Help needed to walk in hospital room?: A Little Help needed climbing 3-5 steps with a railing? : A Lot 6 Click Score: 16    End of Session   Activity Tolerance: Patient tolerated treatment well Patient left: in chair;with call bell/phone within reach Nurse Communication: Mobility status PT Visit Diagnosis: Other abnormalities of gait and mobility (R26.89);Muscle weakness (generalized) (M62.81)     Time: 7096-2836 PT Time Calculation (min) (ACUTE ONLY): 28 min  Charges:  $Gait Training: 8-22 mins $Therapeutic Activity: 8-22 mins                     Zenaida Niece, PT, DPT Acute Rehabilitation Pager: [phone]    Zenaida Niece 10/20/2020, 5:21 PM

## 2020-10-20 NOTE — Progress Notes (Signed)
Patient ID: Tiffany Le, female   DOB: [birth year], 45 y.o.   MRN: [id number]     Advanced Heart Failure Rounding Note  PCP-Cardiologist: Quay Burow, MD   Subjective:    10/09/20 S/P CABG x4  Left Radial Harvest 3/8 s/p left thoracentesis w/ removal of 100 mL light red fluid.  Says she feels bad today. C/o pain in left knee and fingers. Denies SOB. + nausea  Co-ox 61%. Uric acid 9.1   Echo post-op EF slightly better 40-45%. RV mildly reduced.   Objective:   Weight Range: 113 kg Body mass index is 42.76 kg/m.   Vital Signs:   Temp:  [98.1 F (36.7 C)-98.7 F (37.1 C)] 98.1 F (36.7 C) (03/12 0747) Pulse Rate:  [73-87] 86 (03/12 0747) Resp:  [15-20] 18 (03/12 0747) BP: (116-150)/(53-107) 141/83 (03/12 0747) SpO2:  [93 %-99 %] 98 % (03/12 0747) Weight:  [353 kg] 113 kg (03/12 0504) Last BM Date: 10/18/20  Weight change: Filed Weights   10/18/20 0600 10/19/20 0445 10/20/20 0504  Weight: 112.8 kg 112.5 kg 113 kg    Intake/Output:   Intake/Output Summary (Last 24 hours) at 10/20/2020 1021 Last data filed at 10/19/2020 2210 Gross per 24 hour  Intake --  Output 350 ml  Net -350 ml      Physical Exam   General:  Sitting up on side of bed. Uncomfortable . No resp difficulty HEENT: normal Neck: supple. JVP 8-9 . Carotids 2+ bilat; no bruits. No lymphadenopathy or thryomegaly appreciated. Cor: + wound vac  PMI nondisplaced. Regular rate & rhythm. No rubs, gallops or murmurs. Lungs: clear Abdomen: obese soft, nontender, nondistended. No hepatosplenomegaly. No bruits or masses. Good bowel sounds. Extremities: no cyanosis, clubbing, rash, 1+ edema Neuro: alert & orientedx3, cranial nerves grossly intact. moves all 4 extremities w/o difficulty. Affect pleasant   Telemetry   SR 80s Personally reviewed   Labs    CBC Recent Labs    10/19/20 0420 10/20/20 0210  WBC 11.6* 11.8*  HGB 8.8* 9.3*  HCT 28.0* 29.0*  MCV 85.1 85.5  PLT 394 503*   Basic  Metabolic Panel Recent Labs    10/19/20 0420 10/20/20 0210  NA 135 134*  K 4.7 4.6  CL 98 97*  CO2 30 30  GLUCOSE 119* 155*  BUN 32* 34*  CREATININE 1.19* 1.29*  CALCIUM 8.4* 8.5*   Liver Function Tests No results for input(s): AST, ALT, ALKPHOS, BILITOT, PROT, ALBUMIN in the last 72 hours. No results for input(s): LIPASE, AMYLASE in the last 72 hours. Cardiac Enzymes No results for input(s): CKTOTAL, CKMB, CKMBINDEX, TROPONINI in the last 72 hours.  BNP: BNP (last 3 results) Recent Labs    10/02/20 0516  BNP 947.1*    ProBNP (last 3 results) No results for input(s): PROBNP in the last 8760 hours.   D-Dimer No results for input(s): DDIMER in the last 72 hours. Hemoglobin A1C No results for input(s): HGBA1C in the last 72 hours. Fasting Lipid Panel No results for input(s): CHOL, HDL, LDLCALC, TRIG, CHOLHDL, LDLDIRECT in the last 72 hours. Thyroid Function Tests No results for input(s): TSH, T4TOTAL, T3FREE, THYROIDAB in the last 72 hours.  Invalid input(s): FREET3  Other results:   Imaging    No results found.   Medications:     Scheduled Medications: . aspirin EC  81 mg Oral Daily  . atorvastatin  80 mg Oral Daily  . bisacodyl  10 mg Oral Daily  . carvedilol  12.5 mg  Oral BID WC  . Chlorhexidine Gluconate Cloth  6 each Topical Daily  . clopidogrel  75 mg Oral Daily  . colchicine  0.6 mg Oral BID  . dapagliflozin propanediol  10 mg Oral Daily  . docusate sodium  200 mg Oral Daily  . enoxaparin (LOVENOX) injection  40 mg Subcutaneous Q24H  . furosemide  40 mg Oral Daily  . gabapentin  100 mg Oral TID  . glipiZIDE  10 mg Oral BID AC  . insulin aspart  0-24 Units Subcutaneous TID WC  . insulin detemir  18 Units Subcutaneous BID  . isosorbide-hydrALAZINE  1 tablet Oral TID  . pantoprazole  40 mg Oral Daily  . sacubitril-valsartan  1 tablet Oral BID  . spironolactone  25 mg Oral Daily    Infusions: . lactated ringers      PRN  Medications: dextrose, lactated ringers, lactulose, metoprolol tartrate, ondansetron (ZOFRAN) IV, oxyCODONE, sodium chloride flush, sodium chloride flush, traMADol   Assessment/Plan   1. CAD: S/P CABG x4 on 10/09/20.  - ASA 81 mg + Plavix 75 mg  - Atorvastatin 80 mg  - No s/s angina  2. Acute systolic CHF: Echo (3/66) with EF 35-40%, mild LVH (similar this admission and last).  Ischemic CM now s/p CABG. Repeat echo 3/3 LVEF improved 40-45%, RV mildly reduced. Now off milrinone. Co-ox 62%. CVP 8-9 today - Will give lasix 80 mg IV bid today - Continue coreg at 12.5 mg twice a day.  - Continue Farxiga 10 mg  - Continue spironolactone 25 mg daily.  - Continue Bidil 1 tab tid.  - Continue Entresto 49-51 mg twice a day.  3. ID:  Concern for possible aspiration PNA pre-op.  CXR with bilateral infiltrates initially, now clearing with RUL infiltrate only (suspect she asymmetric edema or mucus plugging also). Afebrile. Cultures NGTD.  - On cefepime prior to surgery.  - Encouraged pulmonary toilet - improved 4. Type 2 diabetes: Control improved recently. Hgb A1c 7.5 - Now on Farxiga.  5. VT: Noted in ER though strips not available.  -No VT noted. Off amio      6. AKI: Resolved. Creatinine 2  7. Acute Gout -Uric acid 9.1.  - will start colchicine - No steroids with recent surgery - Can give a dose of toradol as needed - D/w TCTS at bedside  Glori Bickers MD 10/20/2020 10:21 AM

## 2020-10-20 NOTE — Progress Notes (Signed)
Patient was sitting on the chair then Patient c/o dizziness after finished dinner at 1725. Patient stated that it happened more than several days. Checked BP 124/82 on Rt. Leg. Rechecked patient around 1800. Patient was still dizziness with spinning. Rechecked BP 124/79, denied nausea. PA Tessa notified this matter, she asked to page heart failure team for anti vertigo medication since started lasix today and interact with other heart medications. Dr. Haroldine Laws notified vertigo, and ordered hold lasix IVP if CVP <8, ordered Antivert 25 mg PO BID PRN. Patient stated that vertigo was little better, but she was sleepy at the same time. Checked CVP was 10 at 1845. Night shift nurse made aware of this matter and changed lasix time 6 pm to 10 pm. HS Truman Hayward RN

## 2020-10-20 NOTE — Progress Notes (Addendum)
      [address]       [place] [address]             [phone]      11 Days Post-Op Procedure(s) (LRB): CORONARY ARTERY BYPASS GRAFTING (CABG), ON PUMP, TIMES FOUR, USING LEFT INTERNAL MAMMARY ARTERY AND LEFT RADIAL ARTERY (OPEN HARVEST) (N/A) RADIAL ARTERY HARVEST (Left) TRANSESOPHAGEAL ECHOCARDIOGRAM (TEE) (N/A) INDOCYANINE GREEN FLUORESCENCE IMAGING (ICG) (N/A) CLIPPING OF ATRIAL APPENDAGE USING ATRICURE 35MM CLIP (N/A) Subjective: Tired and cold this morning. No other complaints.   Objective: Vital signs in last 24 hours: Temp:  [98.1 F (36.7 C)-98.7 F (37.1 C)] 98.1 F (36.7 C) (03/12 0747) Pulse Rate:  [73-87] 86 (03/12 0747) Cardiac Rhythm: Normal sinus rhythm (03/12 0701) Resp:  [15-20] 18 (03/12 0747) BP: (116-150)/(53-107) 141/83 (03/12 0747) SpO2:  [93 %-99 %] 98 % (03/12 0747) Weight:  [419 kg] 113 kg (03/12 0504)     Intake/Output from previous day: 03/11 0701 - 03/12 0700 In: -  Out: 350 [Urine:350] Intake/Output this shift: No intake/output data recorded.  General appearance: alert, cooperative and no distress Heart: regular rate and rhythm, S1, S2 normal, no murmur, click, rub or gallop Lungs: clear to auscultation bilaterally Abdomen: soft, non-tender; bowel sounds normal; no masses,  no organomegaly Extremities: extremities normal, atraumatic, no cyanosis or edema Wound: clean and dry covered with a prevena with a breast binder  Lab Results: Recent Labs    10/19/20 0420 10/20/20 0210  WBC 11.6* 11.8*  HGB 8.8* 9.3*  HCT 28.0* 29.0*  PLT 394 503*   BMET:  Recent Labs    10/19/20 0420 10/20/20 0210  NA 135 134*  K 4.7 4.6  CL 98 97*  CO2 30 30  GLUCOSE 119* 155*  BUN 32* 34*  CREATININE 1.19* 1.29*  CALCIUM 8.4* 8.5*    PT/INR: No results for input(s): LABPROT, INR in the last 72 hours. ABG    Component Value Date/Time   PHART 7.364 10/09/2020 2158   HCO3 23.9 10/09/2020 2158   TCO2 25 10/09/2020 2158    ACIDBASEDEF 1.0 10/09/2020 2158   O2SAT 60.5 10/20/2020 0210   CBG (last 3)  Recent Labs    10/19/20 1642 10/19/20 2132 10/20/20 0611  GLUCAP 124* 185* 130*    Assessment/Plan: S/P Procedure(s) (LRB): CORONARY ARTERY BYPASS GRAFTING (CABG), ON PUMP, TIMES FOUR, USING LEFT INTERNAL MAMMARY ARTERY AND LEFT RADIAL ARTERY (OPEN HARVEST) (N/A) RADIAL ARTERY HARVEST (Left) TRANSESOPHAGEAL ECHOCARDIOGRAM (TEE) (N/A) INDOCYANINE GREEN FLUORESCENCE IMAGING (ICG) (N/A) CLIPPING OF ATRIAL APPENDAGE USING ATRICURE 35MM CLIP (N/A)    1. CV- NSR, BP stable-  On Amiodarone 200 mg BID, Norvasc, Coreg, Bidil, Entresto 2. Pulm- no acute issues, continue IS 3. Renal- creatinine stable, weight is trending down on Lasix and Spironolactone per AHF 4. GI- N/V appears to be improved this morning. 5. DM- sugars well controlled, continue Farxiga, glipizide, and insulin 6. Deconditioning- Home arrangements have been made  Plan: Meds have been sent to [facility]. Probably will be here through the weekend unless HF says otherwise. Stable this morning.     LOS: 18 days    Elgie Collard 10/20/2020   Chart reviewed, patient examined, agree with above.  She is hemodynamically stable on current heart failure regimen. Co-ox 60%  Diuresing per heart failure.  Complains of pain in left knee and fingers. ? Gout. Colchicine started.

## 2020-10-20 NOTE — Progress Notes (Signed)
CARDIAC REHAB PHASE I   PRE:  Rate/Rhythm: SR 80  BP:  Sitting: 105/57       SaO2: 95  MODE:  Ambulation: 3 ft   POST:  Rate/Rhythm: NSR 84  BP:  Sitting: 115/64      SaO2: 96%  12:45 -1:42 Pt received in bed agrees to ambulate. Pt voices need for commode. Assisted to bedside commode. RN called to assist in cleaning patient. Pt back to bedside. Attempted to ambulate. Pt went 3 feet and voiced dizziness. Pt back to bedside. Knee pain 5/10. Vitals WNL upon recheck. Pt voices feeling better supine. RN notified. Pt would benefit from skilled PT. Daughter at bedside.   Lesly Rubenstein, MS, ACSM EP-C, [facility] 10/20/2020

## 2020-10-21 LAB — COOXEMETRY PANEL
Carboxyhemoglobin: 0.9 % (ref 0.5–1.5)
Methemoglobin: 0.9 % (ref 0.0–1.5)
O2 Saturation: 53 %
Total hemoglobin: 11.7 g/dL — ABNORMAL LOW (ref 12.0–16.0)

## 2020-10-21 LAB — CBC
HCT: 27.2 % — ABNORMAL LOW (ref 36.0–46.0)
Hemoglobin: 8.7 g/dL — ABNORMAL LOW (ref 12.0–15.0)
MCH: 27.3 pg (ref 26.0–34.0)
MCHC: 32 g/dL (ref 30.0–36.0)
MCV: 85.3 fL (ref 80.0–100.0)
Platelets: 497 10*3/uL — ABNORMAL HIGH (ref 150–400)
RBC: 3.19 MIL/uL — ABNORMAL LOW (ref 3.87–5.11)
RDW: 14.3 % (ref 11.5–15.5)
WBC: 12.2 10*3/uL — ABNORMAL HIGH (ref 4.0–10.5)
nRBC: 0 % (ref 0.0–0.2)

## 2020-10-21 LAB — BASIC METABOLIC PANEL
Anion gap: 8 (ref 5–15)
BUN: 28 mg/dL — ABNORMAL HIGH (ref 6–20)
CO2: 32 mmol/L (ref 22–32)
Calcium: 8.7 mg/dL — ABNORMAL LOW (ref 8.9–10.3)
Chloride: 97 mmol/L — ABNORMAL LOW (ref 98–111)
Creatinine, Ser: 1.29 mg/dL — ABNORMAL HIGH (ref 0.44–1.00)
GFR, Estimated: 52 mL/min — ABNORMAL LOW (ref >60)
Glucose, Bld: 83 mg/dL (ref 70–99)
Potassium: 4.5 mmol/L (ref 3.5–5.1)
Sodium: 137 mmol/L (ref 135–145)

## 2020-10-21 LAB — GLUCOSE, CAPILLARY
Glucose-Capillary: 83 mg/dL (ref 70–99)
Glucose-Capillary: 171 mg/dL — ABNORMAL HIGH (ref 70–99)
Glucose-Capillary: 125 mg/dL — ABNORMAL HIGH (ref 70–99)
Glucose-Capillary: 235 mg/dL — ABNORMAL HIGH (ref 70–99)

## 2020-10-21 MED ORDER — TORSEMIDE 20 MG PO TABS
40.0000 mg | ORAL_TABLET | Freq: Every day | ORAL | Status: DC
  Administered 2020-10-21 – 2020-10-27 (×7): 40 mg via ORAL
  Filled 2020-10-21 (×7): qty 2

## 2020-10-21 NOTE — Progress Notes (Addendum)
      [address]       [place] [address]             [phone]      12 Days Post-Op Procedure(s) (LRB): CORONARY ARTERY BYPASS GRAFTING (CABG), ON PUMP, TIMES FOUR, USING LEFT INTERNAL MAMMARY ARTERY AND LEFT RADIAL ARTERY (OPEN HARVEST) (N/A) RADIAL ARTERY HARVEST (Left) TRANSESOPHAGEAL ECHOCARDIOGRAM (TEE) (N/A) INDOCYANINE GREEN FLUORESCENCE IMAGING (ICG) (N/A) CLIPPING OF ATRIAL APPENDAGE USING ATRICURE 35MM CLIP (N/A) Subjective: Feels okay this morning. She thinks the oxycodone gave her vertigo yesterday so we discontinued that this morning.   Objective: Vital signs in last 24 hours: Temp:  [98 F (36.7 C)-99.1 F (37.3 C)] 99.1 F (37.3 C) (03/13 0732) Pulse Rate:  [79-88] 82 (03/13 0732) Cardiac Rhythm: Normal sinus rhythm (03/13 0900) Resp:  [15-19] 15 (03/13 0732) BP: (109-140)/(61-82) 134/66 (03/13 0732) SpO2:  [95 %-100 %] 96 % (03/13 0732) Weight:  [110.3 kg] 110.3 kg (03/13 0500)     Intake/Output from previous day: 03/12 0701 - 03/13 0700 In: 370 [P.O.:370] Out: 1275 [Urine:1275] Intake/Output this shift: Total I/O In: 220 [P.O.:220] Out: -   General appearance: alert, cooperative and no distress Heart: regular rate and rhythm, S1, S2 normal, no murmur, click, rub or gallop Lungs: clear to auscultation bilaterally Abdomen: soft, non-tender; bowel sounds normal; no masses,  no organomegaly Extremities: upper and lower ext edema, improved Wound: clean and dry with prevena and breast binder  Lab Results: Recent Labs    10/20/20 0210 10/21/20 0506  WBC 11.8* 12.2*  HGB 9.3* 8.7*  HCT 29.0* 27.2*  PLT 503* 497*   BMET:  Recent Labs    10/20/20 0210 10/21/20 0506  NA 134* 137  K 4.6 4.5  CL 97* 97*  CO2 30 32  GLUCOSE 155* 83  BUN 34* 28*  CREATININE 1.29* 1.29*  CALCIUM 8.5* 8.7*    PT/INR: No results for input(s): LABPROT, INR in the last 72 hours. ABG    Component Value Date/Time   PHART 7.364 10/09/2020  2158   HCO3 23.9 10/09/2020 2158   TCO2 25 10/09/2020 2158   ACIDBASEDEF 1.0 10/09/2020 2158   O2SAT 53.0 10/21/2020 0506   CBG (last 3)  Recent Labs    10/20/20 1601 10/20/20 2109 10/21/20 0630  GLUCAP 118* 145* 83    Assessment/Plan: S/P Procedure(s) (LRB): CORONARY ARTERY BYPASS GRAFTING (CABG), ON PUMP, TIMES FOUR, USING LEFT INTERNAL MAMMARY ARTERY AND LEFT RADIAL ARTERY (OPEN HARVEST) (N/A) RADIAL ARTERY HARVEST (Left) TRANSESOPHAGEAL ECHOCARDIOGRAM (TEE) (N/A) INDOCYANINE GREEN FLUORESCENCE IMAGING (ICG) (N/A) CLIPPING OF ATRIAL APPENDAGE USING ATRICURE 35MM CLIP (N/A)   1. CV- NSR, BP stable- On Amiodarone 200 mg BID, Norvasc, Coreg, Bidil, Entresto 2. Pulm- no acute issues, continue IS 3. Renal- creatinine stable, weight is trending down on Lasix which was increased yesterday and Spironolactone per AHF 4. GI- N/V appears to be improved this morning. 5. DM- sugars well controlled, continue Farxiga, glipizide, and insulin 6. Deconditioning- Home arrangements have been made 7. Gout flare-colchicine started, symptoms improved 8. Vertigo- Antivert offered but patient denied, likely from oxycodone  Plan: PT recommending SNF. Case management to follow-up with her. Needs frequent motivation to walk/rehab ect.    LOS: 19 days    Elgie Collard 10/21/2020   Chart reviewed, patient examined, agree with above. Weight coming down and creat stable at 1.29. PT recommending SNF at discharge.

## 2020-10-21 NOTE — Progress Notes (Signed)
Patient ID: Tiffany Le, female   DOB: 07/21/1976, 45 y.o.   MRN: [id number]     Advanced Heart Failure Rounding Note  PCP-Cardiologist: Quay Burow, MD   Subjective:    10/09/20 S/P CABG x4  Left Radial Harvest 3/8 s/p left thoracentesis w/ removal of 100 mL light red fluid.  Gout improved with colchicine. Developed vertigo from oxycodone. Feels better today.   Diuresed well with IV lasix yesterday. Weight down 6 pounds. Denies SOB, orthopnea or PND. Co-ox 53% Creatinine stable.  CVP 9-10  Echo post-op EF slightly better 40-45%. RV mildly reduced.   Objective:   Weight Range: 110.3 kg Body mass index is 41.74 kg/m.   Vital Signs:   Temp:  [98.4 F (36.9 C)-99.1 F (37.3 C)] 98.9 F (37.2 C) (03/13 1034) Pulse Rate:  [81-88] 83 (03/13 1034) Resp:  [15-19] 18 (03/13 1034) BP: (108-140)/(51-82) 108/51 (03/13 1034) SpO2:  [95 %-100 %] 95 % (03/13 1034) Weight:  [110.3 kg] 110.3 kg (03/13 0500) Last BM Date: 10/20/20  Weight change: Filed Weights   10/19/20 0445 10/20/20 0504 10/21/20 0500  Weight: 112.5 kg 113 kg 110.3 kg    Intake/Output:   Intake/Output Summary (Last 24 hours) at 10/21/2020 1123 Last data filed at 10/21/2020 1000 Gross per 24 hour  Intake 490 ml  Output 1275 ml  Net -785 ml      Physical Exam   General:  Sitting up in bed. No resp difficulty HEENT: normal Neck: supple. JVP 9-10 Carotids 2+ bilat; no bruits. No lymphadenopathy or thryomegaly appreciated. Cor: PMI nondisplaced. Regular rate & rhythm. No rubs, gallops or murmurs. Lungs: clear Abdomen: obese soft, nontender, nondistended. No hepatosplenomegaly. No bruits or masses. Good bowel sounds. Extremities: no cyanosis, clubbing, rash, trace edema Neuro: alert & orientedx3, cranial nerves grossly intact. moves all 4 extremities w/o difficulty. Affect pleasant   Telemetry   SR 80-90s Personally reviewed   Labs    CBC Recent Labs    10/20/20 0210 10/21/20 0506  WBC 11.8*  12.2*  HGB 9.3* 8.7*  HCT 29.0* 27.2*  MCV 85.5 85.3  PLT 503* 818*   Basic Metabolic Panel Recent Labs    10/20/20 0210 10/21/20 0506  NA 134* 137  K 4.6 4.5  CL 97* 97*  CO2 30 32  GLUCOSE 155* 83  BUN 34* 28*  CREATININE 1.29* 1.29*  CALCIUM 8.5* 8.7*   Liver Function Tests No results for input(s): AST, ALT, ALKPHOS, BILITOT, PROT, ALBUMIN in the last 72 hours. No results for input(s): LIPASE, AMYLASE in the last 72 hours. Cardiac Enzymes No results for input(s): CKTOTAL, CKMB, CKMBINDEX, TROPONINI in the last 72 hours.  BNP: BNP (last 3 results) Recent Labs    10/02/20 0516  BNP 947.1*    ProBNP (last 3 results) No results for input(s): PROBNP in the last 8760 hours.   D-Dimer No results for input(s): DDIMER in the last 72 hours. Hemoglobin A1C No results for input(s): HGBA1C in the last 72 hours. Fasting Lipid Panel No results for input(s): CHOL, HDL, LDLCALC, TRIG, CHOLHDL, LDLDIRECT in the last 72 hours. Thyroid Function Tests No results for input(s): TSH, T4TOTAL, T3FREE, THYROIDAB in the last 72 hours.  Invalid input(s): FREET3  Other results:   Imaging    No results found.   Medications:     Scheduled Medications: . aspirin  81 mg Oral Daily  . atorvastatin  80 mg Oral Daily  . bisacodyl  10 mg Oral Daily  . carvedilol  12.5 mg Oral BID WC  . Chlorhexidine Gluconate Cloth  6 each Topical Daily  . clopidogrel  75 mg Oral Daily  . colchicine  0.6 mg Oral BID  . dapagliflozin propanediol  10 mg Oral Daily  . docusate sodium  200 mg Oral Daily  . enoxaparin (LOVENOX) injection  40 mg Subcutaneous Q24H  . gabapentin  100 mg Oral TID  . glipiZIDE  10 mg Oral BID AC  . insulin aspart  0-24 Units Subcutaneous TID WC  . insulin detemir  18 Units Subcutaneous BID  . isosorbide-hydrALAZINE  1 tablet Oral TID  . pantoprazole sodium  40 mg Per Tube Daily  . sacubitril-valsartan  1 tablet Oral BID  . spironolactone  25 mg Oral Daily     Infusions: . lactated ringers      PRN Medications: dextrose, lactated ringers, lactulose, meclizine, metoprolol tartrate, ondansetron (ZOFRAN) IV, sodium chloride flush, sodium chloride flush, traMADol   Assessment/Plan   1. CAD: S/P CABG x4 on 10/09/20.  - ASA 81 mg + Plavix 75 mg  - Atorvastatin 80 mg  - No s/s angina 2. Acute systolic CHF: Echo (7/67) with EF 35-40%, mild LVH (similar this admission and last).  Ischemic CM now s/p CABG. Repeat echo 3/3 LVEF improved 40-45%, RV mildly reduced. Now off milrinone. Co-ox 62%-> 53% . Volume status improved with IV lasix but CVP still 9-10 - Will place on torsemide 40 daily (can decrease as needed) - Continue coreg at 12.5 mg twice a day.  - Continue Farxiga 10 mg  - Continue spironolactone 25 mg daily.  - Continue Bidil 1 tab tid.  - Continue Entresto 49-51 mg twice a day.  3. ID:  Concern for possible aspiration PNA pre-op.  CXR with bilateral infiltrates initially, now clearing with RUL infiltrate only (suspect she asymmetric edema or mucus plugging also). Afebrile. Cultures NGTD.  - On cefepime prior to surgery.  - Encouraged pulmonary toilet - improved 4. Type 2 diabetes: Control improved recently. Hgb A1c 7.5 - Now on Farxiga.  - No change 5. VT: Noted in ER though strips not available.  -No VT noted. Off amio      6. AKI: Resolved. Creatinine 1.3 today 7. Acute Gout -Uric acid 9.1.  - improved with colchicine - No steroids with recent surgery - Can give a dose of toradol as needed. Avoid oxycodone due to vertigo    Glori Bickers MD 10/21/2020 11:23 AM

## 2020-10-21 NOTE — Plan of Care (Signed)
  Problem: Education: Goal: Knowledge of General Education information will improve Description: Including pain rating scale, medication(s)/side effects and non-pharmacologic comfort measures Outcome: Progressing   Problem: Health Behavior/Discharge Planning: Goal: Ability to manage health-related needs will improve Outcome: Progressing   Problem: Clinical Measurements: Goal: Ability to maintain clinical measurements within normal limits will improve Outcome: Progressing Goal: Will remain free from infection Outcome: Progressing Goal: Diagnostic test results will improve Outcome: Progressing Goal: Respiratory complications will improve Outcome: Progressing Goal: Cardiovascular complication will be avoided Outcome: Progressing   Problem: Activity: Goal: Risk for activity intolerance will decrease Outcome: Progressing   Problem: Coping: Goal: Level of anxiety will decrease Outcome: Progressing   Problem: Elimination: Goal: Will not experience complications related to bowel motility Outcome: Progressing Goal: Will not experience complications related to urinary retention Outcome: Progressing   Problem: Education: Goal: Ability to demonstrate management of disease process will improve Outcome: Progressing Goal: Ability to verbalize understanding of medication therapies will improve Outcome: Progressing Goal: Individualized Educational Video(s) Outcome: Progressing   Problem: Activity: Goal: Capacity to carry out activities will improve Outcome: Progressing   Problem: Cardiac: Goal: Ability to achieve and maintain adequate cardiopulmonary perfusion will improve Outcome: Progressing   Problem: Education: Goal: Will demonstrate proper wound care and an understanding of methods to prevent future damage Outcome: Progressing Goal: Knowledge of disease or condition will improve Outcome: Progressing Goal: Knowledge of the prescribed therapeutic regimen will  improve Outcome: Progressing Goal: Individualized Educational Video(s) Outcome: Progressing   Problem: Activity: Goal: Risk for activity intolerance will decrease Outcome: Progressing   Problem: Cardiac: Goal: Will achieve and/or maintain hemodynamic stability Outcome: Progressing   Problem: Clinical Measurements: Goal: Postoperative complications will be avoided or minimized Outcome: Progressing   Problem: Respiratory: Goal: Respiratory status will improve Outcome: Progressing   Problem: Skin Integrity: Goal: Wound healing without signs and symptoms of infection Outcome: Progressing Goal: Risk for impaired skin integrity will decrease Outcome: Progressing   Problem: Urinary Elimination: Goal: Ability to achieve and maintain adequate renal perfusion and functioning will improve Outcome: Progressing

## 2020-10-22 ENCOUNTER — Inpatient Hospital Stay (HOSPITAL_COMMUNITY): Payer: Medicaid Other

## 2020-10-22 ENCOUNTER — Ambulatory Visit: Payer: Self-pay | Admitting: Cardiothoracic Surgery

## 2020-10-22 LAB — GLUCOSE, CAPILLARY
Glucose-Capillary: 96 mg/dL (ref 70–99)
Glucose-Capillary: 151 mg/dL — ABNORMAL HIGH (ref 70–99)
Glucose-Capillary: 114 mg/dL — ABNORMAL HIGH (ref 70–99)

## 2020-10-22 LAB — BASIC METABOLIC PANEL
Anion gap: 10 (ref 5–15)
BUN: 26 mg/dL — ABNORMAL HIGH (ref 6–20)
CO2: 31 mmol/L (ref 22–32)
Calcium: 8.2 mg/dL — ABNORMAL LOW (ref 8.9–10.3)
Chloride: 94 mmol/L — ABNORMAL LOW (ref 98–111)
Creatinine, Ser: 1.27 mg/dL — ABNORMAL HIGH (ref 0.44–1.00)
GFR, Estimated: 53 mL/min — ABNORMAL LOW (ref >60)
Glucose, Bld: 57 mg/dL — ABNORMAL LOW (ref 70–99)
Potassium: 4.3 mmol/L (ref 3.5–5.1)
Sodium: 135 mmol/L (ref 135–145)

## 2020-10-22 LAB — CBC
HCT: 25.6 % — ABNORMAL LOW (ref 36.0–46.0)
Hemoglobin: 8 g/dL — ABNORMAL LOW (ref 12.0–15.0)
MCH: 27 pg (ref 26.0–34.0)
MCHC: 31.3 g/dL (ref 30.0–36.0)
MCV: 86.5 fL (ref 80.0–100.0)
Platelets: 394 10*3/uL (ref 150–400)
RBC: 2.96 MIL/uL — ABNORMAL LOW (ref 3.87–5.11)
RDW: 14.1 % (ref 11.5–15.5)
WBC: 12.3 10*3/uL — ABNORMAL HIGH (ref 4.0–10.5)
nRBC: 0 % (ref 0.0–0.2)

## 2020-10-22 LAB — COOXEMETRY PANEL
Carboxyhemoglobin: 0.9 % (ref 0.5–1.5)
Methemoglobin: 0.7 % (ref 0.0–1.5)
O2 Saturation: 61.4 %
Total hemoglobin: 11.5 g/dL — ABNORMAL LOW (ref 12.0–16.0)

## 2020-10-22 MED ORDER — PANTOPRAZOLE SODIUM 40 MG PO PACK
40.0000 mg | PACK | Freq: Every day | ORAL | Status: DC
  Filled 2020-10-22 (×2): qty 20

## 2020-10-22 MED ORDER — GADOBUTROL 1 MMOL/ML IV SOLN
11.0000 mL | Freq: Once | INTRAVENOUS | Status: AC | PRN
  Administered 2020-10-22: 11 mL via INTRAVENOUS

## 2020-10-22 NOTE — Progress Notes (Addendum)
Patient ID: Tiffany Le, female   DOB: [birth year], 45 y.o.   MRN: [id number]     Advanced Heart Failure Rounding Note  PCP-Cardiologist: Quay Burow, MD   Subjective:    10/09/20 S/P CABG x4  Left Radial Harvest 3/8 s/p left thoracentesis w/ removal of 100 mL light red fluid.  Co-ox 61%.   Switched back to PO torsemide yesterday. Wt up 3 lb. SCr stable. CVP 7    BP well controlled.   Echo post-op EF slightly better 40-45%. RV mildly reduced.   Objective:   Weight Range: 111.9 kg Body mass index is 42.35 kg/m.   Vital Signs:   Temp:  [98.2 F (36.8 C)-99.1 F (37.3 C)] 98.9 F (37.2 C) (03/14 0256) Pulse Rate:  [77-84] 84 (03/14 0256) Resp:  [15-20] 19 (03/14 0256) BP: (108-134)/(51-66) 117/60 (03/14 0256) SpO2:  [95 %-99 %] 97 % (03/14 0256) Weight:  [111.9 kg] 111.9 kg (03/14 0500) Last BM Date: 10/20/20  Weight change: Filed Weights   10/20/20 0504 10/21/20 0500 10/22/20 0500  Weight: 113 kg 110.3 kg 111.9 kg    Intake/Output:   Intake/Output Summary (Last 24 hours) at 10/22/2020 0716 Last data filed at 10/21/2020 2346 Gross per 24 hour  Intake 570 ml  Output 1000 ml  Net -430 ml      Physical Exam   CVP 7  General:  Well appearing, obese. No respiratory difficulty HEENT: normal Neck: supple. thick neck, JVD not well visualized. Carotids 2+ bilat; no bruits. No lymphadenopathy or thyromegaly appreciated. Cor: PMI nondisplaced. Regular rate & rhythm. No rubs, gallops or murmurs. + sternal wound vac Lungs: decreased BS at the bases bilaterally, no wheezing  Abdomen: soft, nontender, nondistended. No hepatosplenomegaly. No bruits or masses. Good bowel sounds. Extremities: no cyanosis, clubbing, rash, trace bilateral LE edema Neuro: alert & oriented x 3, cranial nerves grossly intact. moves all 4 extremities w/o difficulty. Affect pleasant.   Telemetry   NSR 80s Personally reviewed   Labs    CBC Recent Labs    10/21/20 0506 10/22/20 0405   WBC 12.2* 12.3*  HGB 8.7* 8.0*  HCT 27.2* 25.6*  MCV 85.3 86.5  PLT 497* 623   Basic Metabolic Panel Recent Labs    10/21/20 0506 10/22/20 0405  NA 137 135  K 4.5 4.3  CL 97* 94*  CO2 32 31  GLUCOSE 83 57*  BUN 28* 26*  CREATININE 1.29* 1.27*  CALCIUM 8.7* 8.2*   Liver Function Tests No results for input(s): AST, ALT, ALKPHOS, BILITOT, PROT, ALBUMIN in the last 72 hours. No results for input(s): LIPASE, AMYLASE in the last 72 hours. Cardiac Enzymes No results for input(s): CKTOTAL, CKMB, CKMBINDEX, TROPONINI in the last 72 hours.  BNP: BNP (last 3 results) Recent Labs    10/02/20 0516  BNP 947.1*    ProBNP (last 3 results) No results for input(s): PROBNP in the last 8760 hours.   D-Dimer No results for input(s): DDIMER in the last 72 hours. Hemoglobin A1C No results for input(s): HGBA1C in the last 72 hours. Fasting Lipid Panel No results for input(s): CHOL, HDL, LDLCALC, TRIG, CHOLHDL, LDLDIRECT in the last 72 hours. Thyroid Function Tests No results for input(s): TSH, T4TOTAL, T3FREE, THYROIDAB in the last 72 hours.  Invalid input(s): FREET3  Other results:   Imaging    No results found.   Medications:     Scheduled Medications: . aspirin  81 mg Oral Daily  . atorvastatin  80 mg Oral Daily  .  bisacodyl  10 mg Oral Daily  . carvedilol  12.5 mg Oral BID WC  . Chlorhexidine Gluconate Cloth  6 each Topical Daily  . clopidogrel  75 mg Oral Daily  . colchicine  0.6 mg Oral BID  . dapagliflozin propanediol  10 mg Oral Daily  . docusate sodium  200 mg Oral Daily  . enoxaparin (LOVENOX) injection  40 mg Subcutaneous Q24H  . gabapentin  100 mg Oral TID  . glipiZIDE  10 mg Oral BID AC  . insulin aspart  0-24 Units Subcutaneous TID WC  . insulin detemir  18 Units Subcutaneous BID  . isosorbide-hydrALAZINE  1 tablet Oral TID  . pantoprazole sodium  40 mg Per Tube Daily  . sacubitril-valsartan  1 tablet Oral BID  . spironolactone  25 mg Oral Daily   . torsemide  40 mg Oral Daily    Infusions: . lactated ringers      PRN Medications: dextrose, lactated ringers, lactulose, meclizine, metoprolol tartrate, ondansetron (ZOFRAN) IV, sodium chloride flush, sodium chloride flush, traMADol   Assessment/Plan   1. CAD: S/P CABG x4 on 10/09/20.  - ASA 81 mg + Plavix 75 mg  - Atorvastatin 80 mg  - No s/s angina 2. Acute systolic CHF: Echo (6/54) with EF 35-40%, mild LVH (similar this admission and last).  Ischemic CM now s/p CABG. Repeat echo 3/3 LVEF improved 40-45%, RV mildly reduced. Now off milrinone. Co-ox 62%-> 53%->61% . Volume status improved with IV lasix. CVP 7  - Continue torsemide 40 daily (can decrease as needed) - Continue coreg at 12.5 mg twice a day.  - Continue Farxiga 10 mg  - Continue spironolactone 25 mg daily.  - Continue Bidil 1 tab tid.  - Continue Entresto 49-51 mg twice a day. Plan increase to 97-103 bid soon if BP allows. 3. ID:  Concern for possible aspiration PNA pre-op.  CXR with bilateral infiltrates initially, now clearing with RUL infiltrate only (suspect she asymmetric edema or mucus plugging also). Afebrile. Cultures NGTD.  - On cefepime prior to surgery.  - Encouraged pulmonary toilet - improved 4. Type 2 diabetes: Control improved recently. Hgb A1c 7.5 - Now on Farxiga.  - No change 5. VT: Noted in ER though strips not available.  -No VT noted. Off amio      6. AKI: Resolved. Creatinine 1.3 today (stable)  7. Acute Gout -Uric acid 9.1.  - improved with colchicine - No steroids with recent surgery - Can give a dose of toradol as needed. Avoid oxycodone due to vertigo  8. Deconditioning - continue to ambulate - PT recommending SNF but no insurance. Plan home w/ HH.   Lyda Jester PA-C 10/22/2020 7:16 AM  Patient seen and examined with the above-signed Advanced Practice Provider and/or Housestaff. I personally reviewed laboratory data, imaging studies and relevant notes. I independently  examined the patient and formulated the important aspects of the plan. I have edited the note to reflect any of my changes or salient points. I have personally discussed the plan with the patient and/or family.  Volume status improved. CVP 7. Gout pain improved. Still with balance/vertigo issues.   General:  Well appearing. No resp difficulty HEENT: normal Neck: supple. JVP 7 Carotids 2+ bilat; no bruits. No lymphadenopathy or thryomegaly appreciated. Cor: PMI nondisplaced. Regular rate & rhythm. No rubs, gallops or murmurs. Lungs: clear Abdomen: obese soft, nontender, nondistended. No hepatosplenomegaly. No bruits or masses. Good bowel sounds. Extremities: no cyanosis, clubbing, rash, trace edema Neuro: alert &  orientedx3, cranial nerves grossly intact. moves all 4 extremities w/o difficulty. Affect pleasant  Improving. Stable from HF perspective. If dizziness persists would have low threshold to get Neuro consult vs brain MRI to exclude post-op embolic event.   Glori Bickers, MD  9:30 AM

## 2020-10-22 NOTE — Progress Notes (Addendum)
Occupational Therapy Treatment Patient Details Name: Tiffany Le MRN: [id number] DOB: May 23, 1976 Today's Date: 10/22/2020    History of present illness Pt is a 45 y.o. female recently admitted 2/14-2/17/22 with NSTEMI and LHC with severe CAD, recommended CABG vs. PCI, but left AMA, pt now readmitted 10/02/20 with respiratory distress; ETT 2/22-2/23. Pt with acute hypoxic respiratory failure likely secondary to acute cardiogenic pulmonary edema. S/p CABGx4 3/1. PMH includes HTN, DM2, CAD, HF.   OT comments  Pt progressing towards established OT goals. Upon arrival, pt sitting at [facility] with cardiac rehab at bedside. Pt reporting feeling more fatigued, "loopy", and dizzy compared to prior session; also reporting a headache consistently through the day. Pt able to recall sternal precautions. Pt performing toileting at [place] guard A level. Pt performing mobility to chair and reporting increased dizziness and fatigue; reporting "the world is jumping". Pt agreeable to AROM of BUE/BLEs for strengthening and edema management. Continue to recommend dc to home with HHOT and will continue to follow acutely as admitted.   Follow Up Recommendations  Home health OT    Equipment Recommendations  3 in 1 bedside commode    Recommendations for Other Services  Recommend vestibular PT    Precautions / Restrictions Precautions Precautions: Fall;Sternal Precaution Booklet Issued: Yes (comment) Precaution Comments: Pt recalling sternal precautions Restrictions Other Position/Activity Restrictions: sternal precautions       Mobility Bed Mobility               General bed mobility comments: At [facility] with cardiac rehab upon arrival    Transfers Overall transfer level: Needs assistance Equipment used: Rolling walker (2 wheeled) Transfers: Sit to/from Stand Sit to Stand: Min guard         General transfer comment: Min Guard A for safety    Balance Overall balance assessment: Needs  assistance Sitting-balance support: No upper extremity supported;Feet supported Sitting balance-Leahy Scale: Fair     Standing balance support: Bilateral upper extremity supported Standing balance-Leahy Scale: Poor Standing balance comment: reliant on UE support of RW                           ADL either performed or assessed with clinical judgement   ADL Overall ADL's : Needs assistance/impaired     Grooming: Wash/dry hands;Set up;Supervision/safety;Sitting                   Toilet Transfer: Min IT trainer Details (indicate cue type and reason): Min Guard A for safety. Pt presenting with decreased acitivty tolerance and balance; reporting jumpy vision Toileting- Clothing Manipulation and Hygiene: Min guard;Sit to/from stand Toileting - Clothing Manipulation Details (indicate cue type and reason): educating pt on compensatory techniques for peri care. Pt performing anterior peri care at [facility] A level     Functional mobility during ADLs: Min guard;Rolling walker General ADL Comments: Pt presenting with dizziness, balnace deficits, and decreased activity tolerance compared to last session     Vision       Perception     Praxis      Cognition Arousal/Alertness: Awake/alert Behavior During Therapy: WFL for tasks assessed/performed Overall Cognitive Status: Within Functional Limits for tasks assessed                                 General Comments: Pt reporting she feels loopy and dizzy. Requiring increased time for processing.  Very motivated to particiapte in therapy        Exercises Exercises: General Lower Extremity;General Upper Extremity General Exercises - Upper Extremity Elbow Flexion: AROM;Both;10 reps;Seated Elbow Extension: AROM;Both;10 reps;Seated Wrist Flexion: AROM;Both;10 reps;Seated Wrist Extension: AROM;Both;10 reps;Seated Digit Composite Flexion: AROM;Both;10 reps;Seated Composite  Extension: AROM;Both;10 reps;Seated General Exercises - Lower Extremity Ankle Circles/Pumps: AROM;Both;10 reps Long Arc Quad: AROM;Both;10 reps;Seated   Shoulder Instructions       General Comments VSS on RA. SpO2 95%. BP stable    Pertinent Vitals/ Pain       Pain Assessment: 0-10 Pain Score: 4  Pain Location: HA Pain Descriptors / Indicators: Sore Pain Intervention(s): Monitored during session;Limited activity within patient's tolerance;Repositioned  Home Living                                          Prior Functioning/Environment              Frequency  Min 2X/week        Progress Toward Goals  OT Goals(current goals can now be found in the care plan section)  Progress towards OT goals: Progressing toward goals  Acute Rehab OT Goals Patient Stated Goal: go to rehab to regain independence OT Goal Formulation: With patient Time For Goal Achievement: 10/20/20 Potential to Achieve Goals: Good ADL Goals Pt Will Perform Grooming: with modified independence;standing;sitting Pt Will Perform Upper Body Bathing: with set-up;sitting Pt Will Perform Upper Body Dressing: with set-up;sitting;standing Pt Will Transfer to Toilet: with modified independence;regular height toilet;ambulating Pt Will Perform Toileting - Clothing Manipulation and hygiene: with modified independence;sit to/from stand  Plan Discharge plan remains appropriate    Co-evaluation                 AM-PAC OT "6 Clicks" Daily Activity     Outcome Measure   Help from another person eating meals?: None Help from another person taking care of personal grooming?: A Little Help from another person toileting, which includes using toliet, bedpan, or urinal?: A Lot Help from another person bathing (including washing, rinsing, drying)?: A Lot Help from another person to put on and taking off regular upper body clothing?: A Little Help from another person to put on and taking off  regular lower body clothing?: A Lot 6 Click Score: 16    End of Session Equipment Utilized During Treatment: Rolling walker  OT Visit Diagnosis: Unsteadiness on feet (R26.81);Muscle weakness (generalized) (M62.81);Dizziness and giddiness (R42)   Activity Tolerance Patient tolerated treatment well   Patient Left in bed;with call bell/phone within reach;with nursing/sitter in room   Nurse Communication Mobility status        Time: 1411-1436 OT Time Calculation (min): 25 min  Charges: OT General Charges $OT Visit: 1 Visit OT Treatments $Self Care/Home Management : 8-22 mins $Therapeutic Exercise: 8-22 mins  Schu[place], OTR/L Acute Rehab Pager: [phone] Office: [place] 10/22/2020, 4:50 PM

## 2020-10-22 NOTE — Progress Notes (Addendum)
      [address]       [place] [address]             [phone]        13 Days Post-Op Procedure(s) (LRB): CORONARY ARTERY BYPASS GRAFTING (CABG), ON PUMP, TIMES FOUR, USING LEFT INTERNAL MAMMARY ARTERY AND LEFT RADIAL ARTERY (OPEN HARVEST) (N/A) RADIAL ARTERY HARVEST (Left) TRANSESOPHAGEAL ECHOCARDIOGRAM (TEE) (N/A) INDOCYANINE GREEN FLUORESCENCE IMAGING (ICG) (N/A) CLIPPING OF ATRIAL APPENDAGE USING ATRICURE 35MM CLIP (N/A)  Subjective: Patient did not do much yesterday as she was dizzy. She lost her balance last evening when assisted to the toilet, but did not fall. She states her left knee pain is improving.  Objective: Vital signs in last 24 hours: Temp:  [98.2 F (36.8 C)-99.1 F (37.3 C)] 98.9 F (37.2 C) (03/14 0256) Pulse Rate:  [77-84] 84 (03/14 0256) Cardiac Rhythm: Normal sinus rhythm (03/14 0701) Resp:  [15-20] 19 (03/14 0256) BP: (108-134)/(51-66) 117/60 (03/14 0256) SpO2:  [95 %-99 %] 97 % (03/14 0256) Weight:  [111.9 kg] 111.9 kg (03/14 0500)  Pre op weight 111.9 kg Current Weight  10/22/20 111.9 kg       Intake/Output from previous day: 03/13 0701 - 03/14 0700 In: 570 [P.O.:570] Out: 1000 [Urine:1000]   Physical Exam:  Cardiovascular: RRR Pulmonary: Slightly diminished bibasilar breath sounds Abdomen: Soft, non tender, bowel sounds present. Extremities:SCDs in place Wounds: Prevena VAC on sternum and breast binder  Lab Results: CBC: Recent Labs    10/21/20 0506 10/22/20 0405  WBC 12.2* 12.3*  HGB 8.7* 8.0*  HCT 27.2* 25.6*  PLT 497* 394   BMET:  Recent Labs    10/21/20 0506 10/22/20 0405  NA 137 135  K 4.5 4.3  CL 97* 94*  CO2 32 31  GLUCOSE 83 57*  BUN 28* 26*  CREATININE 1.29* 1.27*  CALCIUM 8.7* 8.2*    PT/INR:  Lab Results  Component Value Date   INR 1.4 (H) 10/09/2020   INR 1.1 10/02/2020   INR 1.0 12/02/2019   ABG:  INR: Will add last result for INR, ABG once components are  confirmed Will add last 4 CBG results once components are confirmed  Assessment/Plan:  1. CV - S/p NSTEMI. SR. On Coreg 12.5 mg bid, Plavix 75 mg daily, Bidil bid 20/37.5 mg bid. Co ox this am up to 61.4;has been off Milrinone. 2.  Pulmonary - On room air. Encourage incentive spirometer. 3. Acute systolic CHF-on Torsemide,  Spironolactone 25 mg daily,  and Entresto bid. 4.  Expected post op acute blood loss anemia - H and H this am decreased to 8 and 25.6 5. DM-CBGs 125/235/96. On Glipizide 10 mg bid, Farxiga 10 mg daily, and Insulin. Pre op HGA1C 7.8 6. AKI-creatinine stable at 1.27 7. Gout-on Colchicine 8. SNF likely not an option as patient does not have insurance. Will likely go home with [facility] but will check with social work 9. Dizziness may be vertigo as does appear to have ortho stasis.  Meclizine PRN already ordered  Elverna Caffee M ZimmermanPA-C 10/22/2020,7:18 AM

## 2020-10-22 NOTE — Plan of Care (Signed)
  Problem: Education: Goal: Knowledge of General Education information will improve Description: Including pain rating scale, medication(s)/side effects and non-pharmacologic comfort measures Outcome: Progressing   Problem: Health Behavior/Discharge Planning: Goal: Ability to manage health-related needs will improve Outcome: Progressing   Problem: Clinical Measurements: Goal: Ability to maintain clinical measurements within normal limits will improve Outcome: Progressing Goal: Will remain free from infection Outcome: Progressing Goal: Diagnostic test results will improve Outcome: Progressing Goal: Respiratory complications will improve Outcome: Progressing Goal: Cardiovascular complication will be avoided Outcome: Progressing   Problem: Activity: Goal: Risk for activity intolerance will decrease Outcome: Progressing   Problem: Coping: Goal: Level of anxiety will decrease Outcome: Progressing   Problem: Elimination: Goal: Will not experience complications related to bowel motility Outcome: Progressing Goal: Will not experience complications related to urinary retention Outcome: Progressing   Problem: Education: Goal: Ability to demonstrate management of disease process will improve Outcome: Progressing Goal: Ability to verbalize understanding of medication therapies will improve Outcome: Progressing Goal: Individualized Educational Video(s) Outcome: Progressing   Problem: Activity: Goal: Capacity to carry out activities will improve Outcome: Progressing   Problem: Cardiac: Goal: Ability to achieve and maintain adequate cardiopulmonary perfusion will improve Outcome: Progressing   Problem: Education: Goal: Will demonstrate proper wound care and an understanding of methods to prevent future damage Outcome: Progressing Goal: Knowledge of disease or condition will improve Outcome: Progressing Goal: Knowledge of the prescribed therapeutic regimen will  improve Outcome: Progressing Goal: Individualized Educational Video(s) Outcome: Progressing   Problem: Activity: Goal: Risk for activity intolerance will decrease Outcome: Progressing   Problem: Cardiac: Goal: Will achieve and/or maintain hemodynamic stability Outcome: Progressing   Problem: Clinical Measurements: Goal: Postoperative complications will be avoided or minimized Outcome: Progressing   Problem: Respiratory: Goal: Respiratory status will improve Outcome: Progressing   Problem: Skin Integrity: Goal: Wound healing without signs and symptoms of infection Outcome: Progressing Goal: Risk for impaired skin integrity will decrease Outcome: Progressing   Problem: Urinary Elimination: Goal: Ability to achieve and maintain adequate renal perfusion and functioning will improve Outcome: Progressing

## 2020-10-22 NOTE — Progress Notes (Signed)
CARDIAC REHAB PHASE I   PRE:  Rate/Rhythm: 79 SR    BP: sitting 125/68    SaO2: 98 2L, 97 RA  MODE:  Ambulation: to BSC   POST:  Rate/Rhythm: 83 SR    BP: sitting 133/86    SaO2: 93 RA  Pt in bed asleep. Doesn't open eyes much but willing to try to ambulate or atleast move to recliner. Daughter present. Mod assist x2 to roll to EOB. Stood with min assist. C/o dizziness with sitting and standing. Took 3 steps toward [facility] but looked more blank therefore we pulled BSC to pt and had her sit. BP 133/86 immediately after sitting (therefore not orthostatic). OT came in to work with her, therefore took over.   [place], ACSM 10/22/2020 2:18 PM

## 2020-10-22 NOTE — Progress Notes (Signed)
Inpatient Diabetes Program Recommendations  AACE/ADA: New Consensus Statement on Inpatient Glycemic Control   Target Ranges:  Prepandial:   less than 140 mg/dL      Peak postprandial:   less than 180 mg/dL (1-2 hours)      Critically ill patients:  140 - 180 mg/dL   Results for NATALYN, SZYMANOWSKI (MRN [id number]) as of 10/22/2020 10:24  Ref. Range 10/21/2020 06:30 10/21/2020 10:58 10/21/2020 15:57 10/21/2020 21:11 10/22/2020 06:33  Glucose-Capillary Latest Ref Range: 70 - 99 mg/dL 83 171 (H) 125 (H) 235 (H) 96   Review of Glycemic Control  Current orders for Inpatient glycemic control: Levemir 18 units BID, Novolog 0-24 units TID with meals, Glipizide 10 mg BID, Farxiga 10 mg daily  Inpatient Diabetes Program Recommendations:    Insulin: Please consider decreasing Levemir to 16 units BID.  Thanks, Barnie Alderman, RN, MSN, CDE Diabetes Coordinator Inpatient Diabetes Program [phone] (Team Pager from 8am to 5pm)

## 2020-10-23 ENCOUNTER — Inpatient Hospital Stay (HOSPITAL_COMMUNITY): Payer: Medicaid Other

## 2020-10-23 DIAGNOSIS — I63133 Cerebral infarction due to embolism of bilateral carotid arteries: Secondary | ICD-10-CM

## 2020-10-23 LAB — GLUCOSE, CAPILLARY
Glucose-Capillary: 78 mg/dL (ref 70–99)
Glucose-Capillary: 57 mg/dL — ABNORMAL LOW (ref 70–99)
Glucose-Capillary: 56 mg/dL — ABNORMAL LOW (ref 70–99)
Glucose-Capillary: 59 mg/dL — ABNORMAL LOW (ref 70–99)
Glucose-Capillary: 107 mg/dL — ABNORMAL HIGH (ref 70–99)
Glucose-Capillary: 69 mg/dL — ABNORMAL LOW (ref 70–99)

## 2020-10-23 LAB — COOXEMETRY PANEL
Carboxyhemoglobin: 1.1 % (ref 0.5–1.5)
Methemoglobin: 1 % (ref 0.0–1.5)
O2 Saturation: 63.8 %
Total hemoglobin: 9.1 g/dL — ABNORMAL LOW (ref 12.0–16.0)

## 2020-10-23 LAB — CBC
HCT: 25.3 % — ABNORMAL LOW (ref 36.0–46.0)
Hemoglobin: 8.2 g/dL — ABNORMAL LOW (ref 12.0–15.0)
MCH: 27.6 pg (ref 26.0–34.0)
MCHC: 32.4 g/dL (ref 30.0–36.0)
MCV: 85.2 fL (ref 80.0–100.0)
Platelets: 385 10*3/uL (ref 150–400)
RBC: 2.97 MIL/uL — ABNORMAL LOW (ref 3.87–5.11)
RDW: 14.2 % (ref 11.5–15.5)
WBC: 8.8 10*3/uL (ref 4.0–10.5)
nRBC: 0 % (ref 0.0–0.2)

## 2020-10-23 LAB — BASIC METABOLIC PANEL
Anion gap: 7 (ref 5–15)
BUN: 23 mg/dL — ABNORMAL HIGH (ref 6–20)
CO2: 33 mmol/L — ABNORMAL HIGH (ref 22–32)
Calcium: 8.2 mg/dL — ABNORMAL LOW (ref 8.9–10.3)
Chloride: 98 mmol/L (ref 98–111)
Creatinine, Ser: 1.05 mg/dL — ABNORMAL HIGH (ref 0.44–1.00)
GFR, Estimated: >60 mL/min (ref >60)
Glucose, Bld: 52 mg/dL — ABNORMAL LOW (ref 70–99)
Potassium: 4 mmol/L (ref 3.5–5.1)
Sodium: 138 mmol/L (ref 135–145)

## 2020-10-23 MED ORDER — INSULIN DETEMIR 100 UNIT/ML ~~LOC~~ SOLN
16.0000 [IU] | Freq: Two times a day (BID) | SUBCUTANEOUS | Status: DC
  Administered 2020-10-23: 16 [IU] via SUBCUTANEOUS
  Filled 2020-10-23 (×4): qty 0.16

## 2020-10-23 NOTE — Progress Notes (Addendum)
      [address]       [place] [address]             [phone]      14 Days Post-Op Procedure(s) (LRB): CORONARY ARTERY BYPASS GRAFTING (CABG), ON PUMP, TIMES FOUR, USING LEFT INTERNAL MAMMARY ARTERY AND LEFT RADIAL ARTERY (OPEN HARVEST) (N/A) RADIAL ARTERY HARVEST (Left) TRANSESOPHAGEAL ECHOCARDIOGRAM (TEE) (N/A) INDOCYANINE GREEN FLUORESCENCE IMAGING (ICG) (N/A) CLIPPING OF ATRIAL APPENDAGE USING ATRICURE 35MM CLIP (N/A)   Subjective:  Sitting up in chair.  No new complaints.  Seems somnolent.  Dizziness persists, but she states it has improved some.  Objective: Vital signs in last 24 hours: Temp:  [97.9 F (36.6 C)-98.5 F (36.9 C)] 98.5 F (36.9 C) (03/15 0258) Pulse Rate:  [77-86] 81 (03/15 0258) Cardiac Rhythm: Normal sinus rhythm (03/14 1952) Resp:  [14-20] 14 (03/15 0258) BP: (115-135)/(59-70) 115/61 (03/15 0258) SpO2:  [94 %-99 %] 94 % (03/15 0258)  Intake/Output from previous day: 03/14 0701 - 03/15 0700 In: -  Out: 400 [Drains:400]  General appearance: alert, cooperative and no distress Heart: regular rate and rhythm Lungs: clear to auscultation bilaterally Abdomen: soft, non-tender; bowel sounds normal; no masses,  no organomegaly Extremities: edema trace Wound: pravena in place  Lab Results: Recent Labs    10/22/20 0405 10/23/20 0315  WBC 12.3* 8.8  HGB 8.0* 8.2*  HCT 25.6* 25.3*  PLT 394 385   BMET:  Recent Labs    10/22/20 0405 10/23/20 0315  NA 135 138  K 4.3 4.0  CL 94* 98  CO2 31 33*  GLUCOSE 57* 52*  BUN 26* 23*  CREATININE 1.27* 1.05*  CALCIUM 8.2* 8.2*    PT/INR: No results for input(s): LABPROT, INR in the last 72 hours. ABG    Component Value Date/Time   PHART 7.364 10/09/2020 2158   HCO3 23.9 10/09/2020 2158   TCO2 25 10/09/2020 2158   ACIDBASEDEF 1.0 10/09/2020 2158   O2SAT 63.8 10/23/2020 0315   CBG (last 3)  Recent Labs    10/22/20 1128 10/22/20 1658 10/23/20 0646  GLUCAP 151* 114* 78     Assessment/Plan: S/P Procedure(s) (LRB): CORONARY ARTERY BYPASS GRAFTING (CABG), ON PUMP, TIMES FOUR, USING LEFT INTERNAL MAMMARY ARTERY AND LEFT RADIAL ARTERY (OPEN HARVEST) (N/A) RADIAL ARTERY HARVEST (Left) TRANSESOPHAGEAL ECHOCARDIOGRAM (TEE) (N/A) INDOCYANINE GREEN FLUORESCENCE IMAGING (ICG) (N/A) CLIPPING OF ATRIAL APPENDAGE USING ATRICURE 35MM CLIP (N/A)  1. CV-NSR, BP controlled- on Coreg, Bidil, Entresto 2. Pulm- no acute issues, continue IS 3. Renal- creatinine stable, on Demadex, Aldactone per AHF, K WNL 4. Neuro- persistent dizziness, no orthostasis noted.  MRI brain obtained and shows acute to early sub acute ischemic infarcts, and a remote lacunar infarct involving the right thalamus... will get Neurology consult 5.DM- Hypoglycemic at times, insulin adjusted per DC recommendations 6. Deconditioning- mobility has been limited by dizziness, now most likely due to ischemic infarcts, continue PT/OT 7. Dispo- patient stable, dizziness persist, MRI shows evidence of ischemic infarcts, and remote lacunar infarct, Neurology consult, continue PT/OT    LOS: 21 days    Ellwood Handler, PA-C 10/23/2020 Pt seen and examined; MRI results noted. Appreciate AHF, neuro input. Jazmine Heckman Z. Orvan Seen, [place]

## 2020-10-23 NOTE — Consult Note (Signed)
Neurology Consultation  Reason for Consult: MRI brain noted acute to early sub acute ischemic infarcts Referring Physician: Dr. Fredrich Romans  CC: Headaches and dizziness beginning after CABG  History is obtained from: Patient and EMR  HPI: Tiffany Le is a 45 y.o. female with PMHx significant for HTN, HLD, T2DM, CAD (severe, LHC 1/61/09), acute systolic/diastolic HF (LVEF 60-45% 11/18/79). Patient was recently admitted to [facility] 2/14 - 2/17 for SOB, found to have elevated troponins and NSTEMI. During this admission, LHC was completed demonstrating severe multivessel CAD with recommendation for CABG vs. PCI. Patient reportedly was concerned about undergoing inpatient procedure and refused at that time, signing out AMA. Discharged on ASA/BB/statin. Patient received CABG x4 during this hospitalization and required harvesting L radial artery to complete procedure. There was also concern for aspiration PNA and patient was placed on cefepime prior to surgery. Patient noted to have L pleural effusion required thoracentesis by IR that removed 19mL of light red fluid.  Patient had been making improvements during hospitalization and rehabilitation; however, when the past week patient began having headaches with photophobia and also reported significant nausea and feeling that "the room was spinning." Patient's nausea and dizziness were debilitating enough that she had to shorten her walks with therapy and is no longer able to walk in the halls. Patient reports that she has been having bad headaches that localize to the R back portion of her head. Patient prefers to attempt to sleep to help ease the pain. Clinical team did an MRI to investigate possible cause for headaches, results were concerning for  acute to early sub acute ischemic infarcts with a remote ischemic infarct.     LKW: >4 days ago tpa given?: no, not indicated  ROS: A 14 point ROS was performed and is negative except as noted in the HPI.    Past Medical History:  Diagnosis Date  . Coronary artery disease   . Diabetes mellitus without complication ([place])   . Diabetic neuropathy ([place]) 02/2020  . Hyperlipidemia age 44  . Hypertension age 19  . Hypocalcemia 11/2019  . Hypokalemia 11/2019  . Iron deficiency anemia   . Obesity   . Proteinuria 12/09/2019     Family History  Problem Relation Age of Onset  . Heart disease Mother        CABG <50  . Hypertension Mother   . Diabetes Mother   . Hyperlipidemia Mother   . Heart disease Father   . Hyperlipidemia Father   . Hypertension Father   . Early death Father   . Healthy Daughter   . Diabetes Maternal Aunt   . Cancer Maternal Grandmother        stomach cancer  . Diabetes Maternal Aunt   . Cancer Cousin        colon cancer (dx'd 94)     Social History:   reports that she has never smoked. She has never used smokeless tobacco. She reports that she does not drink alcohol and does not use drugs.  Medications  Current Facility-Administered Medications:  .  aspirin chewable tablet 81 mg, 81 mg, Oral, Daily, Einar Grad, RPH, 81 mg at 10/23/20 1028 .  atorvastatin (LIPITOR) tablet 80 mg, 80 mg, Oral, Daily, Atkins, Glenice Bow, MD, 80 mg at 10/23/20 1028 .  bisacodyl (DULCOLAX) EC tablet 10 mg, 10 mg, Oral, Daily, 10 mg at 10/23/20 1028 **OR** [DISCONTINUED] bisacodyl (DULCOLAX) suppository 10 mg, 10 mg, Rectal, Daily, Conte, Tessa N, PA-C .  carvedilol (COREG)  tablet 12.5 mg, 12.5 mg, Oral, BID WC, Larey Dresser, MD, 12.5 mg at 10/23/20 0825 .  Chlorhexidine Gluconate Cloth 2 % PADS 6 each, 6 each, Topical, Daily, [name], MD, 6 each at 10/22/20 0814 .  clopidogrel (PLAVIX) tablet 75 mg, 75 mg, Oral, Daily, Atkins, Glenice Bow, MD, 75 mg at 10/23/20 1028 .  colchicine tablet 0.6 mg, 0.6 mg, Oral, BID, Conte, Tessa N, PA-C, 0.6 mg at 10/23/20 1029 .  dapagliflozin propanediol (FARXIGA) tablet 10 mg, 10 mg, Oral, Daily, Larey Dresser, MD, 10 mg at  10/23/20 1027 .  dextrose 50 % solution 0-50 mL, 0-50 mL, Intravenous, PRN, Atkins, Broadus Z, MD .  docusate sodium (COLACE) capsule 200 mg, 200 mg, Oral, Daily, Atkins, Glenice Bow, MD, 200 mg at 10/23/20 1028 .  enoxaparin (LOVENOX) injection 40 mg, 40 mg, Subcutaneous, Q24H, Atkins, Glenice Bow, MD, 40 mg at 10/22/20 1724 .  gabapentin (NEURONTIN) capsule 100 mg, 100 mg, Oral, TID, Atkins, Glenice Bow, MD, 100 mg at 10/23/20 1028 .  glipiZIDE (GLUCOTROL) tablet 10 mg, 10 mg, Oral, BID AC, Atkins, Glenice Bow, MD, 10 mg at 10/23/20 0825 .  insulin aspart (novoLOG) injection 0-24 Units, 0-24 Units, Subcutaneous, TID WC, Atkins, Glenice Bow, MD, 2 Units at 10/22/20 1149 .  insulin detemir (LEVEMIR) injection 16 Units, 16 Units, Subcutaneous, BID, Barrett, Erin R, PA-C, 16 Units at 10/23/20 1028 .  isosorbide-hydrALAZINE (BIDIL) 20-37.5 MG per tablet 1 tablet, 1 tablet, Oral, TID, Orvan Seen, Glenice Bow, MD, 1 tablet at 10/23/20 1028 .  lactated ringers infusion 500 mL, 500 mL, Intravenous, Once PRN, Atkins, Glenice Bow, MD .  lactulose (CHRONULAC) 10 GM/15ML solution 10 g, 10 g, Oral, Daily PRN, Atkins, Glenice Bow, MD .  meclizine (ANTIVERT) tablet 25 mg, 25 mg, Oral, BID PRN, Bensimhon, Shaune Pascal, MD .  metoprolol tartrate (LOPRESSOR) injection 2.5-5 mg, 2.5-5 mg, Intravenous, Q2H PRN, Atkins, Broadus Z, MD .  ondansetron (ZOFRAN) injection 4 mg, 4 mg, Intravenous, Q6H PRN, [name], MD, 4 mg at 10/21/20 0500 .  pantoprazole sodium (PROTONIX) 40 mg/20 mL oral suspension 40 mg, 40 mg, Oral, Daily, Atkins, Glenice Bow, MD .  sacubitril-valsartan (ENTRESTO) 49-51 mg per tablet, 1 tablet, Oral, BID, Larey Dresser, MD, 1 tablet at 10/23/20 1027 .  sodium chloride flush (NS) 0.9 % injection 10-40 mL, 10-40 mL, Intracatheter, PRN, Atkins, Broadus Z, MD .  sodium chloride flush (NS) 0.9 % injection 3 mL, 3 mL, Intravenous, PRN, Atkins, Broadus Z, MD .  spironolactone (ALDACTONE) tablet 25 mg, 25 mg, Oral, Daily,  Atkins, Glenice Bow, MD, 25 mg at 10/23/20 1028 .  torsemide (DEMADEX) tablet 40 mg, 40 mg, Oral, Daily, Bensimhon, Shaune Pascal, MD, 40 mg at 10/23/20 1028 .  traMADol (ULTRAM) tablet 50-100 mg, 50-100 mg, Oral, Q4H PRN, [name], MD, 100 mg at 10/23/20 0300   Exam: Current vital signs: BP (!) 99/55 (BP Location: Right Leg)   Pulse 68   Temp 97.8 F (36.6 C) (Oral)   Resp 15   Ht 5\' 4"  (1.626 m)   Wt 111.9 kg   LMP  (LMP Unknown)   SpO2 93%   BMI 42.35 kg/m  Vital signs in last 24 hours: Temp:  [97.8 F (36.6 C)-98.5 F (36.9 C)] 97.8 F (36.6 C) (03/15 1120) Pulse Rate:  [68-86] 68 (03/15 1120) Resp:  [11-20] 15 (03/15 1120) BP: (99-135)/(55-65) 99/55 (03/15 1120) SpO2:  [91 %-99 %] 93 % (03/15 1120)  GENERAL: Awake, alert in NAD HEENT: - Normocephalic and atraumatic, dry mm,  LUNGS - Normal respiratory effort. SaO2 CV - RRR on tele Ext: warm, well perfused, BLE pitting edema  NEURO:  Mental Status: AA&Ox3 Speech/Language: speech is clear and coherent.  Cranial Nerves:  II: PERRL_2___mm/brisk. visual fields full.  III, IV, VI: EOMI. Lid elevation symmetric and full.  V: Sensation is intact to light touch and symmetrical to face. Blinks to threat. VII: Face is symmetric resting and smiling. Able to puff cheeks and raise eyebrows.  VIII: Hearing intact to voice IX, X: Palate elevation is symmetric. Phonation normal.  XI: Normal sternocleidomastoid and trapezius muscle strength XII: Tongue protrudes midline without fasciculations.   Motor: 4/5 strength is all muscle groups. Patient has some pain, moreso in the L arm and bilateral lower extremities post-op.  Tone is normal. Bulk is normal.  Sensation- Intact to light touch bilaterally in all four extremities. Extinction intact.   Coordination: FTN intact bilaterally.  DTRs: 2+ throughout.  Gait- deferred  1a Level of Conscious.: 0 1b LOC Questions: 0 1c LOC Commands: 0 2 Best Gaze: 0 3 Visual: 0 4 Facial  Palsy: 0 5a Motor Arm - left: 0 5b Motor Arm - Right: 0 6a Motor Leg - Left: 0 6b Motor Leg - Right: 0 7 Limb Ataxia: 0 8 Sensory: 0 9 Best Language: 0 10 Dysarthria: 0 11 Extinct. and Inatten.: 0 TOTAL: 0   Labs I have reviewed labs in epic and the results pertinent to this consultation are:   CBC    Component Value Date/Time   WBC 8.8 10/23/2020 0315   RBC 2.97 (L) 10/23/2020 0315   HGB 8.2 (L) 10/23/2020 0315   HGB 12.2 12/09/2019 1509   HCT 25.3 (L) 10/23/2020 0315   HCT 36.6 12/09/2019 1509   PLT 385 10/23/2020 0315   PLT 412 12/09/2019 1509   MCV 85.2 10/23/2020 0315   MCV 83 12/09/2019 1509   MCH 27.6 10/23/2020 0315   MCHC 32.4 10/23/2020 0315   RDW 14.2 10/23/2020 0315   RDW 12.5 12/09/2019 1509   LYMPHSABS 2.6 12/09/2019 1509   MONOABS 0.6 01/12/2014 1140   EOSABS 0.1 12/09/2019 1509   BASOSABS 0.0 12/09/2019 1509    CMP     Component Value Date/Time   NA 138 10/23/2020 0315   NA 138 12/09/2019 1509   K 4.0 10/23/2020 0315   CL 98 10/23/2020 0315   CO2 33 (H) 10/23/2020 0315   GLUCOSE 52 (L) 10/23/2020 0315   BUN 23 (H) 10/23/2020 0315   BUN 12 12/09/2019 1509   CREATININE 1.05 (H) 10/23/2020 0315   CREATININE 0.57 01/12/2014 1140   CALCIUM 8.2 (L) 10/23/2020 0315   PROT 6.4 (L) 10/15/2020 0449   PROT 7.6 12/09/2019 1509   ALBUMIN 2.2 (L) 10/15/2020 0449   ALBUMIN 4.0 12/09/2019 1509   AST 26 10/15/2020 0449   ALT 25 10/15/2020 0449   ALKPHOS 59 10/15/2020 0449   BILITOT 0.4 10/15/2020 0449   BILITOT 0.2 12/09/2019 1509   GFRNONAA >60 10/23/2020 0315   GFRAA 130 12/09/2019 1509    Lipid Panel     Component Value Date/Time   CHOL 131 09/25/2020 1418   CHOL 165 12/09/2019 1509   TRIG 57 10/03/2020 0852   HDL 30 (L) 09/25/2020 1418   HDL 31 (L) 12/09/2019 1509   CHOLHDL 4.4 09/25/2020 1418   VLDL 10 09/25/2020 1418   LDLCALC 91 09/25/2020 1418   LDLCALC 123 (H)  12/09/2019 1509     Imaging I have reviewed the images  obtained:  MR BRAIN W WO CONTRAST  Result Date: 10/23/2020 CLINICAL DATA:  Initial evaluation for dizziness, headache. History of recent CABG. EXAM: MRI HEAD WITHOUT AND WITH CONTRAST TECHNIQUE: Multiplanar, multiecho pulse sequences of the brain and surrounding structures were obtained without and with intravenous contrast. CONTRAST:  65mL GADAVIST GADOBUTROL 1 MMOL/ML IV SOLN COMPARISON:  None available. FINDINGS: Brain: Examination degraded by motion artifact. Cerebral volume within normal limits for age. Minimal hazy T2/FLAIR hyperintensity noted within the periventricular white matter, nonspecific, but most like related to mild chronic microvascular ischemic disease. Superimposed tiny remote lacunar infarct noted at the right thalamus. 1.3 cm area of patchy diffusion abnormality seen involving the deep white matter of the right frontal centrum semi ovale/corona radiata (series 7, image 32). Additional 7 mm focus of diffusion abnormality noted at the contralateral white matter of the left centrum semi ovale/corona radiata. Associated minimal patchy post-contrast enhancement. Additional 8 mm focus seen involving the ventral pons along the midline (series 7, image 19). Additional tiny subcentimeter focus of diffusion abnormality noted at the subcortical anterior left frontal lobe (series 7, image 36). Findings consistent with acute to early subacute ischemic infarcts. No associated mass effect or hemorrhage. Gray-white matter differentiation otherwise maintained. No other areas of encephalomalacia to suggest chronic cortical infarction. No other evidence for acute intracranial hemorrhage. Two chronic micro hemorrhages noted involving the left temporal lobe and left cerebellum. No mass lesion, midline shift or mass effect. No hydrocephalus or extra-axial fluid collection. Pituitary gland suprasellar region within normal limits. Midline structures intact. No other abnormal enhancement. Vascular: Major  intracranial vascular flow voids are maintained. Skull and upper cervical spine: Craniocervical junction within normal limits. Bone marrow signal intensity normal. No scalp soft tissue abnormality. Sinuses/Orbits: Globes and orbital soft tissues demonstrate no acute finding. Mild scattered mucosal thickening noted within the frontoethmoidal sinuses. Paranasal sinuses are otherwise clear. No significant mastoid effusion. Inner ear structures grossly normal. Other: None. IMPRESSION: 1. Patchy multifocal areas of diffusion abnormality involving the bilateral cerebral white matter and pons as above, consistent with acute to early subacute ischemic infarcts. No associated hemorrhage or mass effect. 2. Underlying mild chronic microvascular ischemic disease with small remote lacunar infarct involving the right thalamus. Electronically Signed   By: Jeannine Boga M.D.   On: 10/23/2020 02:28     Impression: Tiffany Le is a 45yo PMHx significant for HTN, HLD, T2DM, CAD (severe, LHC 3/41/96), acute systolic/diastolic HF (LVEF 22-29% 7/98/92). Patient appears to have a neuro exam with no focal deficit, but patient history is concerning for symptoms of ischemia in the pons as patient reports dizziness worsened with motion. Patient MRI did conclude that patient has infarcts in the bilateral infarcts as wells. Bilateral nature suggests that patient's strokes have an embolic source. Patient had an echo 10/11/2020 that indicated decreased EF and hypokinesis of the LV wall, suggesting that etiology of strokes could be cardioembolic. Recommend that patient have vasculature of the head and neck imaged. Also recommend antiemetics to help patient continue rehab. Continue statin therapy as patient LDL was last recorded as 91 prior to CABG and patient is now post CABG. Continue patient ASA and plavix. Patient A1c, 09/26/2020, was 7.8.  Recommendations: - Recommend a repeat echo when able - F/U MRA Head and Neck - Continue  Zofran 4mg  q6h PRN and Meclizine 25mg  BID PRN therapy, consider scheduling prior to patient starting PT and OT to encourage increased  participation - PT consult, OT consult, Speech consult - Risk factor modification - Telemetry monitoring - Frequent neuro checks     Damita Dunnings, MD PGY-1

## 2020-10-23 NOTE — Progress Notes (Signed)
Physical Therapy Treatment Patient Details Name: Tiffany Le MRN: [id number] DOB: [birth year] Today's Date: 10/23/2020    History of Present Illness Pt is a 45 y.o. female recently admitted 2/14-2/17/22 with NSTEMI and LHC with severe CAD, recommended CABG vs. PCI, but left AMA, pt now readmitted 10/02/20 with respiratory distress; ETT 2/22-2/23. Pt with acute hypoxic respiratory failure likely secondary to acute cardiogenic pulmonary edema. S/p CABGx4 3/1. Pt underwent left thoracentesis 3/8. Pt with VAC placed 3/9.  Pt hospitalization complicated by  right thalamic CVA with multiple other subacute infarcts 3/14. PMH includes HTN, DM2, CAD, HF.    PT Comments    Pt admitted with above diagnosis. Pt was in chair on arrival. Difficult assessment of vestibular system as pt was very flat and not answering questions reliably.  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  Pt refused to ambulate as her blood sugar was low and she is reports she is so dizzy.   Pt met 0/3 goals. REvised goals. Pt currently with functional limitations due to balance and endurance deficits.  Pt will benefit from skilled PT to increase their independence and safety with mobility to allow discharge to the venue listed below.     Follow Up Recommendations  SNF     Equipment Recommendations  Rolling walker with 5" wheels;3in1 (PT)    Recommendations for Other Services       Precautions / Restrictions Precautions Precautions: Fall;Sternal Precaution Booklet Issued: Yes (comment) Precaution Comments: Pt recalling sternal precautions, VAC Restrictions Weight Bearing Restrictions: No Other Position/Activity Restrictions: sternal precautions    Mobility  Bed Mobility               General bed mobility comments: Pt in chair in arrival Patient Response: Cooperative  Transfers                    Ambulation/Gait                  Stairs             Wheelchair Mobility    Modified Rankin (Stroke Patients Only)       Balance                                            Cognition Arousal/Alertness: Awake/alert Behavior During Therapy: WFL for tasks assessed/performed Overall Cognitive Status: Within Functional Limits for tasks assessed                                 General Comments: Pt reporting she feels loopy and dizzy. Requiring increased time for processing. Decr participation today with pt having very flat affect      Exercises General Exercises - Lower Extremity Ankle Circles/Pumps: AROM;Both;10 reps Other Exercises Other Exercises: x 1 exercises initiated.  Pt can perform up to 20 seconds with dizziness 7/10.    General Comments General comments (skin integrity, edema, etc.): VSS on RA.  Limited vestibular evaluation. 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  Pt with gaze stability issues and was able to perform x 1 exercises.      Pertinent Vitals/Pain Pain Assessment:  No/denies pain    Home Living                      Prior Function            PT Goals (current goals can now be found in the care plan section) Acute Rehab PT Goals Patient Stated Goal: go to rehab to regain independence PT Goal Formulation: With patient/family Time For Goal Achievement: 11/06/20 Potential to Achieve Goals: Good Progress towards PT goals: Not progressing toward goals - comment (limited by dizziness)    Frequency    Min 3X/week (maintain 3x despite SNF)      PT Plan Current plan remains appropriate    Co-evaluation              AM-PAC PT "6 Clicks" Mobility   Outcome Measure  Help needed turning from your back to your side while in a flat bed without using bedrails?: A Little Help needed moving from lying on your back to  sitting on the side of a flat bed without using bedrails?: A Lot Help needed moving to and from a bed to a chair (including a wheelchair)?: A Little Help needed standing up from a chair using your arms (e.g., wheelchair or bedside chair)?: A Little Help needed to walk in hospital room?: A Little Help needed climbing 3-5 steps with a railing? : A Lot 6 Click Score: 16    End of Session   Activity Tolerance: Patient limited by fatigue (limited by dizziness) Patient left: in chair;with call bell/phone within reach Nurse Communication: Mobility status PT Visit Diagnosis: Other abnormalities of gait and mobility (R26.89);Muscle weakness (generalized) (M62.81)     Time: 1140-1200 PT Time Calculation (min) (ACUTE ONLY): 20 min  Charges:  $Therapeutic Exercise: 8-22 mins                     Tiffany Le,PT Acute Rehab Services [phone] (pager)   Tiffany Le 10/23/2020, 12:25 PM

## 2020-10-23 NOTE — TOC Progression Note (Signed)
Transition of Care [facility]) - Progression Note    Patient Details  Name: Tiffany Le MRN: [id number] Date of Birth: [birth year]  Transition of Care [facility]) CM/SW [place], [place] Phone Number: 10/23/2020, 10:23 AM  Clinical Narrative:     Per first source/medassist notes, pt does not currently have an eligible diagnosis for 12 month disability. They will continue to monitor for eligibility. Lack of insurance is barrier to SNF. TOC will continue to follow for d/c needs.   Expected Discharge Plan: [place] Barriers to Discharge: No Barriers Identified  Expected Discharge Plan and Services Expected Discharge Plan: [place] In-house Referral: NA Discharge Planning Services: CM Consult Post Acute Care Choice: [place] Living arrangements for the past 2 months: Apartment                 DME Arranged: Gilford Rile rolling,Bedside commode DME Agency: [employer] Date DME Agency Contacted: 10/18/20 Time DME Agency Contacted: 1134 Representative spoke with at DME Agency: [place]: PT,OT [place] Agency: [place] ([place]) Date [place]: 10/10/20 Time [place]: 1400 Representative spoke with at [place]: [place] Determinants of Health ([place]) Interventions Financial Strain Interventions: Other (Comment) (pt. reports she would like to file for disability, and has no health insurance, and she has to pay out of pocket for all her meds, and doesn't have food stamps and has to walk up steps to her apartment with no elevator) Housing Interventions: Intervention Not Indicated Stress Interventions: Intervention Not Indicated Transportation Interventions: [facility]  Readmission Risk Interventions Readmission Risk Prevention Plan 10/11/2020  Transportation Screening Complete  HRI or [place] Complete  Social Work Consult for [place]  Planning/Counseling Complete  Palliative Care Screening Not Applicable  Medication Review Press photographer) Complete  Some recent data might be hidden

## 2020-10-23 NOTE — Progress Notes (Addendum)
Patient ID: Tiffany Le, female   DOB: July 25, 1976, 45 y.o.   MRN: [id number]     Advanced Heart Failure Rounding Note  PCP-Cardiologist: Quay Burow, MD   Subjective:    10/09/20 S/P CABG x4  Left Radial Harvest 3/8 s/p left thoracentesis w/ removal of 100 mL light red fluid. 3/14 Brain MRI shows acute to early sub acute ischemic infarcts, and a remote lacunar infarct involving the right thalamus  Co-ox 64%. CVP 8. No dyspnea   Continues w/ mild dizziness and HA.    Echo post-op EF slightly better 40-45%. RV mildly reduced.   Objective:   Weight Range: 111.9 kg Body mass index is 42.35 kg/m.   Vital Signs:   Temp:  [97.9 F (36.6 C)-98.5 F (36.9 C)] 98.4 F (36.9 C) (03/15 0807) Pulse Rate:  [76-86] 76 (03/15 0807) Resp:  [11-20] 11 (03/15 0807) BP: (115-135)/(59-65) 117/62 (03/15 0807) SpO2:  [91 %-99 %] 91 % (03/15 0807) Last BM Date: 10/20/20  Weight change: Filed Weights   10/20/20 0504 10/21/20 0500 10/22/20 0500  Weight: 113 kg 110.3 kg 111.9 kg    Intake/Output:   Intake/Output Summary (Last 24 hours) at 10/23/2020 0817 Last data filed at 10/22/2020 1200 Gross per 24 hour  Intake --  Output 400 ml  Net -400 ml      Physical Exam   CVP 8  General:  Fatigue appearing. Siting up in chair. No respiratory difficulty HEENT: normal Neck: supple. JVD 8 cm. Carotids 2+ bilat; no bruits. No lymphadenopathy or thyromegaly appreciated. Cor: PMI nondisplaced. Regular rate & rhythm. No rubs, gallops or murmurs. + sternal wound vac Lungs: clear Abdomen: soft, nontender, nondistended. No hepatosplenomegaly. No bruits or masses. Good bowel sounds. Extremities: no cyanosis, clubbing, rash, trace edema Neuro: alert & oriented x 3, cranial nerves grossly intact. moves all 4 extremities w/o difficulty. Affect pleasant.    Telemetry   NSR 80s Personally reviewed   Labs    CBC Recent Labs    10/22/20 0405 10/23/20 0315  WBC 12.3* 8.8  HGB 8.0* 8.2*   HCT 25.6* 25.3*  MCV 86.5 85.2  PLT 394 672   Basic Metabolic Panel Recent Labs    10/22/20 0405 10/23/20 0315  NA 135 138  K 4.3 4.0  CL 94* 98  CO2 31 33*  GLUCOSE 57* 52*  BUN 26* 23*  CREATININE 1.27* 1.05*  CALCIUM 8.2* 8.2*   Liver Function Tests No results for input(s): AST, ALT, ALKPHOS, BILITOT, PROT, ALBUMIN in the last 72 hours. No results for input(s): LIPASE, AMYLASE in the last 72 hours. Cardiac Enzymes No results for input(s): CKTOTAL, CKMB, CKMBINDEX, TROPONINI in the last 72 hours.  BNP: BNP (last 3 results) Recent Labs    10/02/20 0516  BNP 947.1*    ProBNP (last 3 results) No results for input(s): PROBNP in the last 8760 hours.   D-Dimer No results for input(s): DDIMER in the last 72 hours. Hemoglobin A1C No results for input(s): HGBA1C in the last 72 hours. Fasting Lipid Panel No results for input(s): CHOL, HDL, LDLCALC, TRIG, CHOLHDL, LDLDIRECT in the last 72 hours. Thyroid Function Tests No results for input(s): TSH, T4TOTAL, T3FREE, THYROIDAB in the last 72 hours.  Invalid input(s): FREET3  Other results:   Imaging    MR BRAIN W WO CONTRAST  Result Date: 10/23/2020 CLINICAL DATA:  Initial evaluation for dizziness, headache. History of recent CABG. EXAM: MRI HEAD WITHOUT AND WITH CONTRAST TECHNIQUE: Multiplanar, multiecho pulse sequences of  the brain and surrounding structures were obtained without and with intravenous contrast. CONTRAST:  82mL GADAVIST GADOBUTROL 1 MMOL/ML IV SOLN COMPARISON:  None available. FINDINGS: Brain: Examination degraded by motion artifact. Cerebral volume within normal limits for age. Minimal hazy T2/FLAIR hyperintensity noted within the periventricular white matter, nonspecific, but most like related to mild chronic microvascular ischemic disease. Superimposed tiny remote lacunar infarct noted at the right thalamus. 1.3 cm area of patchy diffusion abnormality seen involving the deep white matter of the right  frontal centrum semi ovale/corona radiata (series 7, image 32). Additional 7 mm focus of diffusion abnormality noted at the contralateral white matter of the left centrum semi ovale/corona radiata. Associated minimal patchy post-contrast enhancement. Additional 8 mm focus seen involving the ventral pons along the midline (series 7, image 19). Additional tiny subcentimeter focus of diffusion abnormality noted at the subcortical anterior left frontal lobe (series 7, image 36). Findings consistent with acute to early subacute ischemic infarcts. No associated mass effect or hemorrhage. Gray-white matter differentiation otherwise maintained. No other areas of encephalomalacia to suggest chronic cortical infarction. No other evidence for acute intracranial hemorrhage. Two chronic micro hemorrhages noted involving the left temporal lobe and left cerebellum. No mass lesion, midline shift or mass effect. No hydrocephalus or extra-axial fluid collection. Pituitary gland suprasellar region within normal limits. Midline structures intact. No other abnormal enhancement. Vascular: Major intracranial vascular flow voids are maintained. Skull and upper cervical spine: Craniocervical junction within normal limits. Bone marrow signal intensity normal. No scalp soft tissue abnormality. Sinuses/Orbits: Globes and orbital soft tissues demonstrate no acute finding. Mild scattered mucosal thickening noted within the frontoethmoidal sinuses. Paranasal sinuses are otherwise clear. No significant mastoid effusion. Inner ear structures grossly normal. Other: None. IMPRESSION: 1. Patchy multifocal areas of diffusion abnormality involving the bilateral cerebral white matter and pons as above, consistent with acute to early subacute ischemic infarcts. No associated hemorrhage or mass effect. 2. Underlying mild chronic microvascular ischemic disease with small remote lacunar infarct involving the right thalamus. Electronically Signed   By:  Jeannine Boga M.D.   On: 10/23/2020 02:28     Medications:     Scheduled Medications: . aspirin  81 mg Oral Daily  . atorvastatin  80 mg Oral Daily  . bisacodyl  10 mg Oral Daily  . carvedilol  12.5 mg Oral BID WC  . Chlorhexidine Gluconate Cloth  6 each Topical Daily  . clopidogrel  75 mg Oral Daily  . colchicine  0.6 mg Oral BID  . dapagliflozin propanediol  10 mg Oral Daily  . docusate sodium  200 mg Oral Daily  . enoxaparin (LOVENOX) injection  40 mg Subcutaneous Q24H  . gabapentin  100 mg Oral TID  . glipiZIDE  10 mg Oral BID AC  . insulin aspart  0-24 Units Subcutaneous TID WC  . insulin detemir  16 Units Subcutaneous BID  . isosorbide-hydrALAZINE  1 tablet Oral TID  . pantoprazole sodium  40 mg Oral Daily  . sacubitril-valsartan  1 tablet Oral BID  . spironolactone  25 mg Oral Daily  . torsemide  40 mg Oral Daily    Infusions: . lactated ringers      PRN Medications: dextrose, lactated ringers, lactulose, meclizine, metoprolol tartrate, ondansetron (ZOFRAN) IV, sodium chloride flush, sodium chloride flush, traMADol   Assessment/Plan   1. CAD: S/P CABG x4 on 10/09/20.  - ASA 81 mg + Plavix 75 mg  - Atorvastatin 80 mg  - No s/s angina 2. Acute systolic CHF:  Echo (2/22) with EF 35-40%, mild LVH (similar this admission and last).  Ischemic CM now s/p CABG. Repeat echo 3/3 LVEF improved 40-45%, RV mildly reduced. Now off milrinone. Co-ox stable at 64% . Volume status improved with IV lasix. CVP 8 - Continue torsemide 40 daily (can titrate as needed) - Continue coreg at 12.5 mg twice a day.  - Continue Farxiga 10 mg  - Continue spironolactone 25 mg daily.  - Continue Bidil 1 tab tid.  - Continue Entresto 49-51 mg twice a day. Plan increase to 97-103 bid soon if BP allows. 3. ID:  Concern for possible aspiration PNA pre-op.  CXR with bilateral infiltrates initially, now clearing with RUL infiltrate only (suspect she asymmetric edema or mucus plugging also).  Afebrile. Cultures NGTD.  - On cefepime prior to surgery.  - Encouraged pulmonary toilet - improved 4. Type 2 diabetes: Control improved recently. Hgb A1c 7.5 - Now on Farxiga.  - No change 5. VT: Noted in ER though strips not available.  -No VT noted. Off amio      6. AKI: Resolved. Creatinine 1.1 today (stable)  7. Acute Gout -Uric acid 9.1.  - improved with colchicine - No steroids with recent surgery - Can give a dose of toradol as needed. Avoid oxycodone due to vertigo  8. Deconditioning - continue to ambulate - PT recommending SNF but no insurance. Plan home w/ HH.  9. CVA: Brain MRI completed given persistent dizziness, showing acute to early sub acute ischemic infarcts, and a remote lacunar infarct involving the right thalamus - neurology consulted    Lyda Jester PA-C 10/23/2020 8:17 AM  Patient seen and examined with the above-signed Advanced Practice Provider and/or Housestaff. I personally reviewed laboratory data, imaging studies and relevant notes. I independently examined the patient and formulated the important aspects of the plan. I have edited the note to reflect any of my changes or salient points. I have personally discussed the plan with the patient and/or family.  She continues to have a HA and some dizziness. Denies SOB, orthopnea or PND. Brain MRI with several small acute/subacute infarcts. Co-ox 64%. Weight relatively stable.   General:  Sitting in chair. Eyes closed. No resp difficulty HEENT: normal Neck: supple. JVP 7-8. Carotids 2+ bilat; no bruits. No lymphadenopathy or thryomegaly appreciated. Cor: PMI nondisplaced. + wound vac Regular rate & rhythm. No rubs, gallops or murmurs. Lungs: clear Abdomen: obese soft, nontender, nondistended. No hepatosplenomegaly. No bruits or masses. Good bowel sounds. Extremities: no cyanosis, clubbing, rash, trace edema Neuro: alert & orientedx3, cranial nerves grossly intact. moves all 4 extremities w/o  difficulty. Affect pleasant  Volume status looks ok. No angina. Complaining of dizziness but I cannot elicit any significant nystagmus on exam. Brain MRO results noted. Await Neuro input. Continue current CV meds. PT/OT.   Glori Bickers, MD  8:45 AM

## 2020-10-23 NOTE — Plan of Care (Signed)

## 2020-10-24 ENCOUNTER — Inpatient Hospital Stay (HOSPITAL_COMMUNITY): Payer: Medicaid Other

## 2020-10-24 DIAGNOSIS — I63133 Cerebral infarction due to embolism of bilateral carotid arteries: Secondary | ICD-10-CM

## 2020-10-24 DIAGNOSIS — I5021 Acute systolic (congestive) heart failure: Secondary | ICD-10-CM

## 2020-10-24 LAB — GLUCOSE, CAPILLARY
Glucose-Capillary: 111 mg/dL — ABNORMAL HIGH (ref 70–99)
Glucose-Capillary: 53 mg/dL — ABNORMAL LOW (ref 70–99)
Glucose-Capillary: 96 mg/dL (ref 70–99)
Glucose-Capillary: 84 mg/dL (ref 70–99)
Glucose-Capillary: 105 mg/dL — ABNORMAL HIGH (ref 70–99)
Glucose-Capillary: 86 mg/dL (ref 70–99)
Glucose-Capillary: 126 mg/dL — ABNORMAL HIGH (ref 70–99)

## 2020-10-24 LAB — BASIC METABOLIC PANEL
Anion gap: 6 (ref 5–15)
BUN: 21 mg/dL — ABNORMAL HIGH (ref 6–20)
CO2: 33 mmol/L — ABNORMAL HIGH (ref 22–32)
Calcium: 8.4 mg/dL — ABNORMAL LOW (ref 8.9–10.3)
Chloride: 96 mmol/L — ABNORMAL LOW (ref 98–111)
Creatinine, Ser: 0.95 mg/dL (ref 0.44–1.00)
GFR, Estimated: >60 mL/min (ref >60)
Glucose, Bld: 54 mg/dL — ABNORMAL LOW (ref 70–99)
Potassium: 3.9 mmol/L (ref 3.5–5.1)
Sodium: 135 mmol/L (ref 135–145)

## 2020-10-24 LAB — ECHOCARDIOGRAM LIMITED
Height: 64 in
Weight: 3901.26 oz

## 2020-10-24 LAB — CBC
HCT: 27.5 % — ABNORMAL LOW (ref 36.0–46.0)
Hemoglobin: 8.9 g/dL — ABNORMAL LOW (ref 12.0–15.0)
MCH: 27.6 pg (ref 26.0–34.0)
MCHC: 32.4 g/dL (ref 30.0–36.0)
MCV: 85.1 fL (ref 80.0–100.0)
Platelets: 385 10*3/uL (ref 150–400)
RBC: 3.23 MIL/uL — ABNORMAL LOW (ref 3.87–5.11)
RDW: 14.2 % (ref 11.5–15.5)
WBC: 7.5 10*3/uL (ref 4.0–10.5)
nRBC: 0 % (ref 0.0–0.2)

## 2020-10-24 LAB — COOXEMETRY PANEL
Carboxyhemoglobin: 1.2 % (ref 0.5–1.5)
Methemoglobin: 0.9 % (ref 0.0–1.5)
O2 Saturation: 69.9 %
Total hemoglobin: 9.1 g/dL — ABNORMAL LOW (ref 12.0–16.0)

## 2020-10-24 MED ORDER — INSULIN DETEMIR 100 UNIT/ML ~~LOC~~ SOLN
8.0000 [IU] | Freq: Two times a day (BID) | SUBCUTANEOUS | Status: DC
  Administered 2020-10-24 – 2020-10-27 (×6): 8 [IU] via SUBCUTANEOUS
  Filled 2020-10-24 (×7): qty 0.08

## 2020-10-24 MED ORDER — PERFLUTREN LIPID MICROSPHERE
1.0000 mL | INTRAVENOUS | Status: AC | PRN
  Administered 2020-10-24: 2 mL via INTRAVENOUS
  Filled 2020-10-24: qty 10

## 2020-10-24 MED ORDER — INSULIN DETEMIR 100 UNIT/ML ~~LOC~~ SOLN
14.0000 [IU] | Freq: Two times a day (BID) | SUBCUTANEOUS | Status: DC
  Filled 2020-10-24 (×2): qty 0.14

## 2020-10-24 MED ORDER — PANTOPRAZOLE SODIUM 40 MG PO TBEC
40.0000 mg | DELAYED_RELEASE_TABLET | Freq: Every day | ORAL | Status: DC
  Administered 2020-10-24 – 2020-10-27 (×4): 40 mg via ORAL
  Filled 2020-10-24 (×4): qty 1

## 2020-10-24 NOTE — Progress Notes (Addendum)
Occupational Therapy Treatment Patient Details Name: Tiffany Le NO MRN: [id number] DOB: 04/02/1976 Today's Date: 10/24/2020    History of present illness Pt is a 44 y.o. female recently admitted 2/14-2/17/22 with NSTEMI and LHC with severe CAD, recommended CABG vs. PCI, but left AMA, pt now readmitted 10/02/20 with respiratory distress; ETT 2/22-2/23. Pt with acute hypoxic respiratory failure likely secondary to acute cardiogenic pulmonary edema. S/p CABGx4 3/1. Pt underwent left thoracentesis 3/8. Pt with VAC placed 3/9.  Pt developed headaches with photophobia and also reported significant nausea and feeling that "the room was spinning." MRI 3/14 - Multifocal infarcts in multiple vascular territories (bilateral hemispheres and posterior circulation); pontine; remote thalamic infarct. PMH includes HTN, DM2, CAD, HF.   OT comments  Completed grooming task at sink in standing without complaints of dizziness while incorporating compensatory strategies. Pt able to recall "move in the tube" however requires mod A with LB ADL due to sternotomy precautions and body habitus. Will benefit from use of AE to maintain precautions and maximize independence with ADL tasks. Will return to educate on AE.   Follow Up Recommendations  Home health OT    Equipment Recommendations  3 in 1 bedside commode;Tub/shower bench    Recommendations for Other Services      Precautions / Restrictions Precautions Precautions: Fall;Sternal Precaution Booklet Issued: Yes (comment) Precaution Comments: Pt recalling sternal precautions, VAC; vertigo Restrictions Other Position/Activity Restrictions: sternal precautions       Mobility Bed Mobility               General bed mobility comments: Pt in chair in arrival    Transfers Overall transfer level: Needs assistance Equipment used: Rolling walker (2 wheeled) Transfers: Sit to/from Stand Sit to Stand: Modified independent (Device/Increase time)          General transfer comment: using RW    Balance Overall balance assessment: Needs assistance Sitting-balance support: No upper extremity supported;Feet supported Sitting balance-Leahy Scale: Fair     Standing balance support: Bilateral upper extremity supported Standing balance-Leahy Scale: Fair Standing balance comment: reliant on UE support of RW                           ADL either performed or assessed with clinical judgement   ADL Overall ADL's : Needs assistance/impaired     Grooming: Set up;Standing   Upper Body Bathing: Set up;Sitting   Lower Body Bathing: Sit to/from stand;Moderate assistance   Upper Body Dressing : Minimal assistance;Sitting   Lower Body Dressing: Sit to/from stand;Moderate assistance   Toilet Transfer: Supervision/safety;RW;Ambulation;BSC   Toileting- Clothing Manipulation and Hygiene: Moderate assistance Toileting - Clothing Manipulation Details (indicate cue type and reason): able to wipe after urinating however unablet o complete pericare after a BM, requiring mod A.     Functional mobility during ADLs: Supervision/safety;Rolling walker;Cueing for safety General ADL Comments: Pt incorporating compensatory techniques for dizziness into ADL session.     Vision       Perception     Praxis      Cognition Arousal/Alertness: Awake/alert Behavior During Therapy: WFL for tasks assessed/performed Overall Cognitive Status: Within Functional Limits for tasks assessed                                          Exercises   Shoulder Instructions       General  Comments Nsg states pt has orders for Anivert PRN    Pertinent Vitals/ Pain       Pain Assessment: Faces Faces Pain Scale: Hurts a little bit Pain Location: wound vac Pain Descriptors / Indicators: Sore Pain Intervention(s): Limited activity within patient's tolerance  Home Living                                          Prior  Functioning/Environment              Frequency  Min 2X/week        Progress Toward Goals  OT Goals(current goals can now be found in the care plan section)  Progress towards OT goals: Progressing toward goals (goals updated)  Acute Rehab OT Goals Patient Stated Goal: to be able to take care of herself OT Goal Formulation: With patient Time For Goal Achievement: 11/03/20 Potential to Achieve Goals: Good ADL Goals Pt Will Perform Upper Body Bathing: with modified independence;sitting Pt Will Perform Lower Body Bathing: with modified independence;with adaptive equipment;sit to/from stand Pt Will Perform Upper Body Dressing: with modified independence;sitting Pt Will Perform Lower Body Dressing: with modified independence;with adaptive equipment;sit to/from stand Pt Will Transfer to Toilet: with modified independence;ambulating;bedside commode Pt Will Perform Toileting - Clothing Manipulation and hygiene: with modified independence;with adaptive equipment;sitting/lateral leans;sit to/from stand  Plan Discharge plan remains appropriate    Co-evaluation                 AM-PAC OT "6 Clicks" Daily Activity     Outcome Measure   Help from another person eating meals?: None Help from another person taking care of personal grooming?: A Little Help from another person toileting, which includes using toliet, bedpan, or urinal?: A Lot Help from another person bathing (including washing, rinsing, drying)?: A Lot Help from another person to put on and taking off regular upper body clothing?: A Little Help from another person to put on and taking off regular lower body clothing?: A Lot 6 Click Score: 16    End of Session Equipment Utilized During Treatment: Rolling walker  OT Visit Diagnosis: Unsteadiness on feet (R26.81);Muscle weakness (generalized) (M62.81);Dizziness and giddiness (R42);Pain;Other abnormalities of gait and mobility (R26.89) Pain - part of body:  (wound vac  site)   Activity Tolerance Patient tolerated treatment well   Patient Left in chair;with call bell/phone within reach;with family/visitor present   Nurse Communication Mobility status        Time: 1000-1017 OT Time Calculation (min): 17 min  Charges: OT General Charges $OT Visit: 1 Visit OT Treatments $Self Care/Home Management : 8-22 mins  Maurie Boettcher, OT/L   Acute OT Clinical Specialist [place] Pager [phone] Office [phone]    [facility] 10/24/2020, 3:31 PM

## 2020-10-24 NOTE — Progress Notes (Signed)
STROKE TEAM PROGRESS NOTE   HPI Briefly: Admitted for SOB, s/p CABG x4 Developed occipital headache primarily right sided with vertigo like symptoms on 3/14.  MRI shows pontine CVA INTERVAL HISTORY No acute events overnight.  Patient is sitting up in chair at bedside in good spirits reporting feeling much better today overall. Dizziness and vision disturbance are resolved. She was able to stand and brush her teeth and walk with therapists today.   Vitals:   10/23/20 1629 10/23/20 2343 10/24/20 0342 10/24/20 0929  BP: 114/62 116/72 117/68 128/71  Pulse: 71 73 81   Resp: 11 12 17 14   Temp: 98 F (36.7 C) 97.8 F (36.6 C) 97.9 F (36.6 C) 98.2 F (36.8 C)  TempSrc: Oral Oral Oral Oral  SpO2: 93% 99% 99% 99%  Weight:   110.6 kg   Height:       CBC:  Recent Labs  Lab 10/23/20 0315 10/24/20 0409  WBC 8.8 7.5  HGB 8.2* 8.9*  HCT 25.3* 27.5*  MCV 85.2 85.1  PLT 385 951   Basic Metabolic Panel:  Recent Labs  Lab 10/23/20 0315 10/24/20 0409  NA 138 135  K 4.0 3.9  CL 98 96*  CO2 33* 33*  GLUCOSE 52* 54*  BUN 23* 21*  CREATININE 1.05* 0.95  CALCIUM 8.2* 8.4*   Lipid Panel: No results for input(s): CHOL, TRIG, HDL, CHOLHDL, VLDL, LDLCALC in the last 168 hours. HgbA1c: No results for input(s): HGBA1C in the last 168 hours. Urine Drug Screen: No results for input(s): LABOPIA, COCAINSCRNUR, LABBENZ, AMPHETMU, THCU, LABBARB in the last 168 hours.  Alcohol Level No results for input(s): ETH in the last 168 hours.  IMAGING past 24 hours MR ANGIO HEAD WO CONTRAST  Result Date: 10/23/2020 CLINICAL DATA:  Follow-up examination for acute stroke. EXAM: MRA NECK WITHOUT CONTRAST MRA HEAD WITHOUT CONTRAST TECHNIQUE: Multiplanar and multiecho pulse sequences of the neck were obtained without intravenous contrast. Angiographic images of the neck were obtained using MRA technique without and with intravenous contrast; Angiographic images of the Circle of Willis were obtained using MRA  technique without intravenous contrast. COMPARISON:  Prior MRI from 10/22/2020 FINDINGS: MRA NECK FINDINGS AORTIC ARCH: Examination technically limited by lack of IV contrast. Aortic arch and origin of the great vessels not well assessed on this examination. RIGHT CAROTID SYSTEM: Right CCA patent from its origin to the bifurcation without stenosis. Minor atheromatous irregularity about the right carotid bulb without significant stenosis. Right ICA patent distally without stenosis, evidence for dissection, or occlusion. LEFT CAROTID SYSTEM: Left CCA patent from its origin to the bifurcation without stenosis. Minor atheromatous irregularity about the left carotid bulb without significant stenosis. Left ICA patent distally without stenosis, evidence for dissection, or occlusion. VERTEBRAL ARTERIES: Both vertebral arteries arise from the subclavian arteries. Vertebral arteries widely patent within the neck without stenosis, evidence for dissection or occlusion. MRA HEAD FINDINGS ANTERIOR CIRCULATION: Distal cervical segments of both internal carotid arteries widely patent with antegrade flow. Petrous, cavernous, and supraclinoid segments patent without stenosis or other abnormality. A1 segments patent bilaterally. Normal anterior communicating artery complex. Anterior cerebral arteries patent to their distal aspects without stenosis. No M1 stenosis or occlusion. Normal MCA bifurcations. Distal MCA branches well perfused and symmetric. POSTERIOR CIRCULATION: Both vertebral arteries patent to the vertebrobasilar junction without stenosis. Left V4 segment dominant. Neither PICA origin well visualized. Basilar patent to its distal aspect without stenosis. Superior cerebellar arteries patent bilaterally. Apparent stenosis at the origin of the left SCA  favored to be artifactual on this exam. Both PCAs primarily supplied via the basilar well perfused to their distal aspects. No intracranial aneurysm. IMPRESSION: Negative MRA  of the head and neck. No large vessel occlusion, hemodynamically significant stenosis, or other acute vascular abnormality. Electronically Signed   By: Jeannine Boga M.D.   On: 10/23/2020 20:15   MR ANGIO NECK WO CONTRAST  Result Date: 10/23/2020 CLINICAL DATA:  Follow-up examination for acute stroke. EXAM: MRA NECK WITHOUT CONTRAST MRA HEAD WITHOUT CONTRAST TECHNIQUE: Multiplanar and multiecho pulse sequences of the neck were obtained without intravenous contrast. Angiographic images of the neck were obtained using MRA technique without and with intravenous contrast; Angiographic images of the Circle of Willis were obtained using MRA technique without intravenous contrast. COMPARISON:  Prior MRI from 10/22/2020 FINDINGS: MRA NECK FINDINGS AORTIC ARCH: Examination technically limited by lack of IV contrast. Aortic arch and origin of the great vessels not well assessed on this examination. RIGHT CAROTID SYSTEM: Right CCA patent from its origin to the bifurcation without stenosis. Minor atheromatous irregularity about the right carotid bulb without significant stenosis. Right ICA patent distally without stenosis, evidence for dissection, or occlusion. LEFT CAROTID SYSTEM: Left CCA patent from its origin to the bifurcation without stenosis. Minor atheromatous irregularity about the left carotid bulb without significant stenosis. Left ICA patent distally without stenosis, evidence for dissection, or occlusion. VERTEBRAL ARTERIES: Both vertebral arteries arise from the subclavian arteries. Vertebral arteries widely patent within the neck without stenosis, evidence for dissection or occlusion. MRA HEAD FINDINGS ANTERIOR CIRCULATION: Distal cervical segments of both internal carotid arteries widely patent with antegrade flow. Petrous, cavernous, and supraclinoid segments patent without stenosis or other abnormality. A1 segments patent bilaterally. Normal anterior communicating artery complex. Anterior cerebral  arteries patent to their distal aspects without stenosis. No M1 stenosis or occlusion. Normal MCA bifurcations. Distal MCA branches well perfused and symmetric. POSTERIOR CIRCULATION: Both vertebral arteries patent to the vertebrobasilar junction without stenosis. Left V4 segment dominant. Neither PICA origin well visualized. Basilar patent to its distal aspect without stenosis. Superior cerebellar arteries patent bilaterally. Apparent stenosis at the origin of the left SCA favored to be artifactual on this exam. Both PCAs primarily supplied via the basilar well perfused to their distal aspects. No intracranial aneurysm. IMPRESSION: Negative MRA of the head and neck. No large vessel occlusion, hemodynamically significant stenosis, or other acute vascular abnormality. Electronically Signed   By: Jeannine Boga M.D.   On: 10/23/2020 20:15   PHYSICAL EXAM Obese middle-aged African-American lady not in distress. GENERAL: Awake, alert in NAD HEENT: - Normocephalic and atraumatic, dry mm,  LUNGS - Normal respiratory effort. SaO2 CV - RRR on tele Ext: warm, well perfused, BLE pitting edema  NEURO:  Mental Status: AA&Ox3 Speech/Language: speech is clear and coherent.  Cranial Nerves:  II: PERRL_2___mm/brisk. visual fields full.  III, IV, VI: EOMI. Lid elevation symmetric and full.  V: Sensation is intact to light touch and symmetrical to face. Blinks to threat. VII: Face is symmetric resting and smiling. Able to puff cheeks and raise eyebrows.  VIII: Hearing intact to voice IX, X: Palate elevation is symmetric. Phonation normal.  XI: Normal sternocleidomastoid and trapezius muscle strength XII: Tongue protrudes midline without fasciculations.   Motor: 4/5 strength is all muscle groups. Patient has some pain, moreso in the L arm and bilateral lower extremities post-op.  Tone is normal. Bulk is normal.  Sensation- Intact to light touch bilaterally in all four extremities. Extinction intact.  Coordination: FTN intact bilaterally.  DTRs: 2+ throughout.  Gait- deferred ASSESSMENT/PLAN   Mrs. Hyslop is a 45yo PMHx significant for HTN, HLD, T2DM, CAD (severe, LHC 9/83/38), acute systolic/diastolic HF (LVEF 25-05% 3/97/67). Admitted 2/22 with progressive shortness of breath in the setting of  s/p recent LHC demonstrating severemultivesselCAD with recommendation for CABG vs. PCI.  Patient received CABG x4. This past week patient began having occipital headaches with photophobia and also reported significant nausea and feeling that "the room was spinning." Patient appears to have a neuro exam with no focal deficit, but patient history is concerning for symptoms of ischemia in the pons as patient reports dizziness worsened with motion. Patient had an suggesting that etiology of strokes could be cardioembolic.     Stroke Subacute  patchy ischemic infarcts  involving the bilateral cerebral white matter likely periprocedural from CABG and acute pontine lacunar infarct -MRI Brain Patchy multifocal areas of diffusion abnormality involving the bilateral cerebral white matter and pons as above, consistent with acute to early subacute ischemic infarcts. No associated hemorrhage or mass effect.Underlying mild chronic microvascular ischemic disease with small remote lacunar infarct involving the right thalamus. -MRA Head and Neck: No LVO, No other significant findings  -2D Echo: 10/11/2020 EF 40-45%,no shunt.  hypokinesis of the LV wall noted -Tele tech reviewed entire admission for atrial fibrillation without any found as reported by Truman Hayward, RN.  -LDL 91 -HgbA1c 7.5 -VTE prophylaxis - on lovenox PPX -On heart healthy diet -No anticoagulants PTA. On ASA 81mg  for a short while prior to admission. ON ASA 81mg  and Plavix 75mg  daily x 3 weeks then ASA alone.  -Therapy recommendations:  Cleared for home -Disposition:  Cleared for home  Hypertension . Permissive hypertension (OK if < 220/120) but  gradually normalize in 5-7 days . Long-term BP goal normotensive  Hyperlipidemia  LDL 91, goal < 70  High intensity statin: Lipitor 80mg  daily   Continue statin at discharge  Diabetes type II Uncontrolled  HgbA1c 7.5, goal < 7.0  CBGs Recent Labs    10/24/20 0508 10/24/20 0635 10/24/20 1138  GLUCAP 96 84 105*      SSI  Other Stroke Risk Factors  Obesity, Body mass index is 41.85 kg/m., BMI >/= 30 associated with increased stroke risk, recommend weight loss, diet and exercise as appropriate   Coronary artery disease  Congestive heart failure  Other Active Problems     Hospital day # 22 I have personally obtained history,examined this patient, reviewed notes, independently viewed imaging studies, participated in medical decision making and plan of care.ROS completed by me personally and pertinent positives fully documented  I have made any additions or clarifications directly to the above note. Agree with note above.  Patient had CABG about 2 weeks ago and did well after that but for the last 2 days complaint of headache dizziness and vertigo and MRI shows an acute pontine infarct.  Also shows subacute looking bilateral white matter diffusion positive lesions which are likely periprocedural post CABG which appear to be clinically silent.  Recommend aspirin Plavix for 3 weeks followed by aspirin alone and aggressive risk factor modification.  Review of telemetry does not show any definite A. fib with recommend outpatient 30-day telemetry and discharge for paroxysmal A. fib.  Greater than 50% time during the 35-minute visit was spent in coordination of care about stroke with strokes  Antony Contras, MD Medical Director [place] Pager: [phone] 10/24/2020 4:56 PM   To contact Stroke Continuity provider,  please refer to [email or web address] After hours, contact General Neurology

## 2020-10-24 NOTE — Progress Notes (Signed)
Occupational Therapy Treatment Note  Focus of session on AE education to maximize functional level of independence while maintaining sternal precautions.    10/24/20 1530  OT Visit Information  Last OT Received On 10/24/20  Assistance Needed +1  History of Present Illness Pt is a 45 y.o. female recently admitted 2/14-2/17/22 with NSTEMI and LHC with severe CAD, recommended CABG vs. PCI, but left AMA, pt now readmitted 10/02/20 with respiratory distress; ETT 2/22-2/23. Pt with acute hypoxic respiratory failure likely secondary to acute cardiogenic pulmonary edema. S/p CABGx4 3/1. Pt underwent left thoracentesis 3/8. Pt with VAC placed 3/9.  Pt developed headaches with photophobia and also reported significant nausea and feeling that "the room was spinning." MRI 3/14 - Multifocal infarcts in multiple vascular territories (bilateral hemispheres and posterior circulation); pontine; remote thalamic infarct. PMH includes HTN, DM2, CAD, HF.  Precautions  Precautions Fall;Sternal  Precaution Comments Pt recalling sternal precautions, VAC; vertigo  Pain Assessment  Pain Assessment No/denies pain  Cognition  Arousal/Alertness Awake/alert  Behavior During Therapy WFL for tasks assessed/performed  Overall Cognitive Status Within Functional Limits for tasks assessed  ADL  General ADL Comments Focus of session on education with use of AE for LB ADL and pericare after BM (using a toilet aid). Pt encouraged to use AE during ADL tasks to maintain precautions and maximize independence. Discussed need for pt to use AE while she is completing her self care, then discuss any concerns with OT during the next session, as unsure if pt will qualify for [facility] services. Pt verbalzied understanding. Pt has a BSC however she is concerned about stepping over her tub. Discussed option of using a tub bench and pt informed on how to order if she chooses to do so.  OT - End of Session  Activity Tolerance Patient tolerated treatment  well  Patient left in chair;with call bell/phone within reach  Nurse Communication Mobility status;Other (comment) (encourage use of AE)  OT Assessment/Plan  OT Plan Discharge plan remains appropriate  OT Visit Diagnosis Unsteadiness on feet (R26.81);Muscle weakness (generalized) (M62.81);Dizziness and giddiness (R42);Pain;Other abnormalities of gait and mobility (R26.89)  OT Frequency (ACUTE ONLY) Min 2X/week  Follow Up Recommendations Home health OT  OT Equipment 3 in 1 bedside commode;Tub/shower bench  AM-PAC OT "6 Clicks" Daily Activity Outcome Measure (Version 2)  Help from another person eating meals? 4  Help from another person taking care of personal grooming? 3  Help from another person toileting, which includes using toliet, bedpan, or urinal? 2  Help from another person bathing (including washing, rinsing, drying)? 2  Help from another person to put on and taking off regular upper body clothing? 3  Help from another person to put on and taking off regular lower body clothing? 2  6 Click Score 16  OT Goal Progression  Progress towards OT goals Progressing toward goals  Acute Rehab OT Goals  Patient Stated Goal to be able to take care of herself  OT Goal Formulation With patient  Time For Goal Achievement 11/07/20  Potential to Achieve Goals Good  ADL Goals  Pt Will Perform Grooming with modified independence;standing;sitting  Pt Will Perform Upper Body Bathing with modified independence;sitting  Pt Will Perform Upper Body Dressing with modified independence;sitting  Pt Will Transfer to Toilet with modified independence;ambulating;bedside commode  Pt Will Perform Toileting - Clothing Manipulation and hygiene with modified independence;with adaptive equipment;sitting/lateral leans;sit to/from stand  Pt Will Perform Lower Body Bathing with modified independence;with adaptive equipment;sit to/from stand;Independently  Pt Will  Perform Lower Body Dressing with modified  independence;with adaptive equipment;sit to/from stand  OT Time Calculation  OT Start Time (ACUTE ONLY) 1146  OT Stop Time (ACUTE ONLY) 1206  OT Time Calculation (min) 20 min  OT General Charges  $OT Visit 1 Visit  OT Treatments  $Self Care/Home Management  8-22 mins  Maurie Boettcher, OT/L   Acute OT Clinical Specialist [place] Pager [phone] Office [phone]

## 2020-10-24 NOTE — Progress Notes (Addendum)
Patient ID: Tiffany Le, female   DOB: November 10, 1975, 45 y.o.   MRN: [id number]     Advanced Heart Failure Rounding Note  PCP-Cardiologist: Quay Burow, MD   Subjective:    10/09/20 S/P CABG x4  Left Radial Harvest 3/8 s/p left thoracentesis w/ removal of 100 mL light red fluid. 3/14 Brain MRI shows acute to early sub acute ischemic infarcts, and a remote lacunar infarct involving the right thalamus  Co-ox 70%   Feeling better today. No dizziness. Able to walk in the room with PT    Echo post-op EF slightly better 40-45%. RV mildly reduced.   Objective:   Weight Range: 110.6 kg Body mass index is 41.85 kg/m.   Vital Signs:   Temp:  [97.8 F (36.6 C)-98.2 F (36.8 C)] 98.2 F (36.8 C) (03/16 0929) Pulse Rate:  [68-81] 81 (03/16 0342) Resp:  [11-17] 14 (03/16 0929) BP: (99-128)/(55-72) 128/71 (03/16 0929) SpO2:  [93 %-99 %] 99 % (03/16 0929) Weight:  [110.6 kg] 110.6 kg (03/16 0342) Last BM Date: 10/20/20  Weight change: Filed Weights   10/21/20 0500 10/22/20 0500 10/24/20 0342  Weight: 110.3 kg 111.9 kg 110.6 kg    Intake/Output:   Intake/Output Summary (Last 24 hours) at 10/24/2020 0944 Last data filed at 10/23/2020 2127 Gross per 24 hour  Intake 240 ml  Output 600 ml  Net -360 ml      Physical Exam   General:  Sitting the chair. No resp difficulty HEENT: normal Neck: supple. no JVD. Carotids 2+ bilat; no bruits. No lymphadenopathy or thryomegaly appreciated. Cor: PMI nondisplaced. Regular rate & rhythm. No rubs, gallops or murmurs. Incisional VAC on sternum.. Lungs: clear Abdomen: soft, nontender, nondistended. No hepatosplenomegaly. No bruits or masses. Good bowel sounds. Extremities: no cyanosis, clubbing, rash, edema. LUE staples  Neuro: alert & orientedx3, cranial nerves grossly intact. moves all 4 extremities w/o difficulty. Affect pleasant    Telemetry   NSR 80s    Labs    CBC Recent Labs    10/23/20 0315 10/24/20 0409  WBC 8.8  7.5  HGB 8.2* 8.9*  HCT 25.3* 27.5*  MCV 85.2 85.1  PLT 385 979   Basic Metabolic Panel Recent Labs    10/23/20 0315 10/24/20 0409  NA 138 135  K 4.0 3.9  CL 98 96*  CO2 33* 33*  GLUCOSE 52* 54*  BUN 23* 21*  CREATININE 1.05* 0.95  CALCIUM 8.2* 8.4*   Liver Function Tests No results for input(s): AST, ALT, ALKPHOS, BILITOT, PROT, ALBUMIN in the last 72 hours. No results for input(s): LIPASE, AMYLASE in the last 72 hours. Cardiac Enzymes No results for input(s): CKTOTAL, CKMB, CKMBINDEX, TROPONINI in the last 72 hours.  BNP: BNP (last 3 results) Recent Labs    10/02/20 0516  BNP 947.1*    ProBNP (last 3 results) No results for input(s): PROBNP in the last 8760 hours.   D-Dimer No results for input(s): DDIMER in the last 72 hours. Hemoglobin A1C No results for input(s): HGBA1C in the last 72 hours. Fasting Lipid Panel No results for input(s): CHOL, HDL, LDLCALC, TRIG, CHOLHDL, LDLDIRECT in the last 72 hours. Thyroid Function Tests No results for input(s): TSH, T4TOTAL, T3FREE, THYROIDAB in the last 72 hours.  Invalid input(s): FREET3  Other results:   Imaging    MR ANGIO HEAD WO CONTRAST  Result Date: 10/23/2020 CLINICAL DATA:  Follow-up examination for acute stroke. EXAM: MRA NECK WITHOUT CONTRAST MRA HEAD WITHOUT CONTRAST TECHNIQUE: Multiplanar and multiecho  pulse sequences of the neck were obtained without intravenous contrast. Angiographic images of the neck were obtained using MRA technique without and with intravenous contrast; Angiographic images of the Circle of Willis were obtained using MRA technique without intravenous contrast. COMPARISON:  Prior MRI from 10/22/2020 FINDINGS: MRA NECK FINDINGS AORTIC ARCH: Examination technically limited by lack of IV contrast. Aortic arch and origin of the great vessels not well assessed on this examination. RIGHT CAROTID SYSTEM: Right CCA patent from its origin to the bifurcation without stenosis. Minor  atheromatous irregularity about the right carotid bulb without significant stenosis. Right ICA patent distally without stenosis, evidence for dissection, or occlusion. LEFT CAROTID SYSTEM: Left CCA patent from its origin to the bifurcation without stenosis. Minor atheromatous irregularity about the left carotid bulb without significant stenosis. Left ICA patent distally without stenosis, evidence for dissection, or occlusion. VERTEBRAL ARTERIES: Both vertebral arteries arise from the subclavian arteries. Vertebral arteries widely patent within the neck without stenosis, evidence for dissection or occlusion. MRA HEAD FINDINGS ANTERIOR CIRCULATION: Distal cervical segments of both internal carotid arteries widely patent with antegrade flow. Petrous, cavernous, and supraclinoid segments patent without stenosis or other abnormality. A1 segments patent bilaterally. Normal anterior communicating artery complex. Anterior cerebral arteries patent to their distal aspects without stenosis. No M1 stenosis or occlusion. Normal MCA bifurcations. Distal MCA branches well perfused and symmetric. POSTERIOR CIRCULATION: Both vertebral arteries patent to the vertebrobasilar junction without stenosis. Left V4 segment dominant. Neither PICA origin well visualized. Basilar patent to its distal aspect without stenosis. Superior cerebellar arteries patent bilaterally. Apparent stenosis at the origin of the left SCA favored to be artifactual on this exam. Both PCAs primarily supplied via the basilar well perfused to their distal aspects. No intracranial aneurysm. IMPRESSION: Negative MRA of the head and neck. No large vessel occlusion, hemodynamically significant stenosis, or other acute vascular abnormality. Electronically Signed   By: Jeannine Boga M.D.   On: 10/23/2020 20:15   MR ANGIO NECK WO CONTRAST  Result Date: 10/23/2020 CLINICAL DATA:  Follow-up examination for acute stroke. EXAM: MRA NECK WITHOUT CONTRAST MRA HEAD  WITHOUT CONTRAST TECHNIQUE: Multiplanar and multiecho pulse sequences of the neck were obtained without intravenous contrast. Angiographic images of the neck were obtained using MRA technique without and with intravenous contrast; Angiographic images of the Circle of Willis were obtained using MRA technique without intravenous contrast. COMPARISON:  Prior MRI from 10/22/2020 FINDINGS: MRA NECK FINDINGS AORTIC ARCH: Examination technically limited by lack of IV contrast. Aortic arch and origin of the great vessels not well assessed on this examination. RIGHT CAROTID SYSTEM: Right CCA patent from its origin to the bifurcation without stenosis. Minor atheromatous irregularity about the right carotid bulb without significant stenosis. Right ICA patent distally without stenosis, evidence for dissection, or occlusion. LEFT CAROTID SYSTEM: Left CCA patent from its origin to the bifurcation without stenosis. Minor atheromatous irregularity about the left carotid bulb without significant stenosis. Left ICA patent distally without stenosis, evidence for dissection, or occlusion. VERTEBRAL ARTERIES: Both vertebral arteries arise from the subclavian arteries. Vertebral arteries widely patent within the neck without stenosis, evidence for dissection or occlusion. MRA HEAD FINDINGS ANTERIOR CIRCULATION: Distal cervical segments of both internal carotid arteries widely patent with antegrade flow. Petrous, cavernous, and supraclinoid segments patent without stenosis or other abnormality. A1 segments patent bilaterally. Normal anterior communicating artery complex. Anterior cerebral arteries patent to their distal aspects without stenosis. No M1 stenosis or occlusion. Normal MCA bifurcations. Distal MCA branches well perfused and symmetric.  POSTERIOR CIRCULATION: Both vertebral arteries patent to the vertebrobasilar junction without stenosis. Left V4 segment dominant. Neither PICA origin well visualized. Basilar patent to its distal  aspect without stenosis. Superior cerebellar arteries patent bilaterally. Apparent stenosis at the origin of the left SCA favored to be artifactual on this exam. Both PCAs primarily supplied via the basilar well perfused to their distal aspects. No intracranial aneurysm. IMPRESSION: Negative MRA of the head and neck. No large vessel occlusion, hemodynamically significant stenosis, or other acute vascular abnormality. Electronically Signed   By: Jeannine Boga M.D.   On: 10/23/2020 20:15     Medications:     Scheduled Medications: . aspirin  81 mg Oral Daily  . atorvastatin  80 mg Oral Daily  . bisacodyl  10 mg Oral Daily  . carvedilol  12.5 mg Oral BID WC  . Chlorhexidine Gluconate Cloth  6 each Topical Daily  . clopidogrel  75 mg Oral Daily  . colchicine  0.6 mg Oral BID  . dapagliflozin propanediol  10 mg Oral Daily  . docusate sodium  200 mg Oral Daily  . enoxaparin (LOVENOX) injection  40 mg Subcutaneous Q24H  . gabapentin  100 mg Oral TID  . glipiZIDE  10 mg Oral BID AC  . insulin aspart  0-24 Units Subcutaneous TID WC  . insulin detemir  14 Units Subcutaneous BID  . isosorbide-hydrALAZINE  1 tablet Oral TID  . pantoprazole  40 mg Oral Daily  . sacubitril-valsartan  1 tablet Oral BID  . spironolactone  25 mg Oral Daily  . torsemide  40 mg Oral Daily    Infusions: . lactated ringers      PRN Medications: dextrose, lactated ringers, lactulose, meclizine, metoprolol tartrate, ondansetron (ZOFRAN) IV, sodium chloride flush, sodium chloride flush, traMADol   Assessment/Plan   1. CAD: S/P CABG x4 on 10/09/20.  - ASA 81 mg + Plavix 75 mg  - Atorvastatin 80 mg  - No chest pain 2. Acute systolic CHF: Echo (4/12) with EF 35-40%, mild LVH (similar this admission and last).  Ischemic CM now s/p CABG. Repeat echo 3/3 LVEF improved 40-45%, RV mildly reduced. Now off milrinone. Co-ox stable at 64% .  - Volume status stable. - Continue torsemide 40 daily (can titrate as  needed) - Continue coreg at 12.5 mg twice a day.  - Continue Farxiga 10 mg will get 30 day free card  - Continue spironolactone 25 mg daily.  - Continue Bidil 1 tab tid --> $ 10.00 - Continue Entresto 49-51 mg twice a day. Plan increase to 97-103 bid soon if BP allows. 3. ID:  Concern for possible aspiration PNA pre-op.  CXR with bilateral infiltrates initially, now clearing with RUL infiltrate only (suspect she asymmetric edema or mucus plugging also). Afebrile. Cultures NGTD.  - On cefepime prior to surgery.  - Encouraged pulmonary toilet 4. Type 2 diabetes: Control improved recently. Hgb A1c 7.5 - Now on Farxiga.  - No change 5. VT: Noted in ER though strips not available.  -No VT noted. Off amio      6. AKI: Resolved.   7. Acute Gout -Uric acid 9.1.  - improved with colchicine 8. Deconditioning - continue to ambulate - PT recommending SNF but no insurance. Plan home w/ HH.  9. CVA: Brain MRI completed given persistent dizziness, showing acute to early sub acute ischemic infarcts, and a remote lacunar infarct involving the right thalamus - Neuro recommending repeat ECHO to r/o thrombus. ECHO ordered.    Advanced  Heart Failure Team Pager [phone] (M-F; 7a - 5p)  Please contact [facility] for night-coverage after hours (4p -7a ) and weekends on [email or web address]  Amy Clegg NP-C  10/24/2020 9:44 AM  Patient seen and examined with the above-signed Advanced Practice Provider and/or Housestaff. I personally reviewed laboratory data, imaging studies and relevant notes. I independently examined the patient and formulated the important aspects of the plan. I have edited the note to reflect any of my changes or salient points. I have personally discussed the plan with the patient and/or family.  Feeling better today. Less dizzy. No CP or SOB. Imaging shows no intra/extracranial stenoses. Echo EF 50-55% no LV clot. Personally reviewed Volume status ok  Renal function stable. Co-ox  70%  General:  Well appearing. No resp difficulty HEENT: normal Neck: supple. no JVD. Carotids 2+ bilat; no bruits. No lymphadenopathy or thryomegaly appreciated. Cor: PMI nondisplaced. Regular rate & rhythm. No rubs, gallops or murmurs. Lungs: clear Abdomen: obese soft, nontender, nondistended. No hepatosplenomegaly. No bruits or masses. Good bowel sounds. Extremities: no cyanosis, clubbing, rash, edema Neuro: alert & orientedx3, cranial nerves grossly intact. moves all 4 extremities w/o difficulty. Affect pleasant  Imaging as above reviewed personally. Clinically improved. Volume status ok. Will make med changes as above. Continue to mobilize.   Glori Bickers, MD  9:48 PM

## 2020-10-24 NOTE — Progress Notes (Signed)
Inpatient Diabetes Program Recommendations  AACE/ADA: New Consensus Statement on Inpatient Glycemic Control (2015)  Target Ranges:  Prepandial:   less than 140 mg/dL      Peak postprandial:   less than 180 mg/dL (1-2 hours)      Critically ill patients:  140 - 180 mg/dL   Lab Results  Component Value Date   GLUCAP 105 (H) 10/24/2020   HGBA1C 7.5 (H) 10/09/2020    Review of Glycemic Control Results for Tiffany Le, Tiffany Le (MRN [id number]) as of 10/24/2020 12:56  Ref. Range 10/23/2020 21:49 10/24/2020 04:10 10/24/2020 05:08 10/24/2020 06:35 10/24/2020 11:38  Glucose-Capillary Latest Ref Range: 70 - 99 mg/dL 111 (H) 53 (L) 96 84 105 (H)   Diabetes history: DM 2 Outpatient Diabetes medications:  Lantus 35 units daily, Glucotrol 10 mg bid,  Current orders for Inpatient glycemic control:  Glucotrol 10 mg bid Novolog TCTS 0-24 units tid with meals Levemir 14 units bid Farxiga 10 mg daily  Inpatient Diabetes Program Recommendations:    Please reduce Levemir to 8 units bid and d/c Glucotrol.    Thanks,  Adah Perl, RN, BC-ADM Inpatient Diabetes Coordinator Pager [phone] (8a-5p)

## 2020-10-24 NOTE — Progress Notes (Signed)
Physical Therapy Treatment Patient Details Name: Tiffany Le MRN: [id number] DOB: 1976/02/10 Today's Date: 10/24/2020    History of Present Illness Pt is a 45 y.o. female recently admitted 2/14-2/17/22 with NSTEMI and LHC with severe CAD, recommended CABG vs. PCI, but left AMA, pt now readmitted 10/02/20 with respiratory distress; ETT 2/22-2/23. Pt with acute hypoxic respiratory failure likely secondary to acute cardiogenic pulmonary edema. S/p CABGx4 3/1. Pt underwent left thoracentesis 3/8. Pt with VAC placed 3/9.  Pt hospitalization complicated by  right thalamic CVA with multiple other subacute infarcts 3/14. PMH includes HTN, DM2, CAD, HF.    PT Comments    Pt admitted with above diagnosis. Pt was able to progress ambulation today and was not dizzy and did not have LE pain.  Pt was educated regarding compensatory strategies for vertigo if the vertigo persists. REcommend HHPT for vestibular rehab as well when pt goes home.  Pt agrees.   Pt currently with functional limitations due to balance and endurance deficits. Pt will benefit from skilled PT to increase their independence and safety with mobility to allow discharge to the venue listed below.     Follow Up Recommendations  Home health PT;Supervision/Assistance - 24 hour (Vestibular PT)     Equipment Recommendations  Rolling walker with 5" wheels;3in1 (PT)    Recommendations for Other Services       Precautions / Restrictions Precautions Precautions: Fall;Sternal Precaution Booklet Issued: Yes (comment) Precaution Comments: Pt recalling sternal precautions, VAC Restrictions Other Position/Activity Restrictions: sternal precautions    Mobility  Bed Mobility               General bed mobility comments: Pt in chair in arrival    Transfers Overall transfer level: Needs assistance Equipment used: Rolling walker (2 wheeled) Transfers: Sit to/from Stand Sit to Stand: Independent         General transfer comment:  No assist and pt follows sternal precautions with out cues.  Ambulation/Gait Ambulation/Gait assistance: Min guard;Supervision Gait Distance (Feet): 80 Feet Assistive device: Rolling walker (2 wheeled) Gait Pattern/deviations: Step-through pattern;Decreased stride length Gait velocity: slowed Gait velocity interpretation: <1.31 ft/sec, indicative of household ambulator General Gait Details: pt with slowed gait but steady with RW.  No dizziness reported. Did discuss compensation techniques with pt   Stairs             Wheelchair Mobility    Modified Rankin (Stroke Patients Only)       Balance Overall balance assessment: Needs assistance Sitting-balance support: No upper extremity supported;Feet supported Sitting balance-Leahy Scale: Fair     Standing balance support: Bilateral upper extremity supported Standing balance-Leahy Scale: Poor Standing balance comment: reliant on UE support of RW                            Cognition Arousal/Alertness: Awake/alert Behavior During Therapy: WFL for tasks assessed/performed Overall Cognitive Status: Within Functional Limits for tasks assessed                                        Exercises General Exercises - Lower Extremity Ankle Circles/Pumps: AROM;Both;10 reps Long Arc Quad: AROM;Both;10 reps;Seated Hip Flexion/Marching: AROM;Both;Seated;15 reps    General Comments General comments (skin integrity, edema, etc.): VSS.  Discussed x1 exercises and pt states that her neuro doctor told her not to do them if they made her  dizzy.  Noted that the neuro doctor also has pt on Zofran and Antivert for vertigo.  Will discuss further with neuro MD to see if she can resume x 1 exercises in future.      Pertinent Vitals/Pain      Home Living                      Prior Function            PT Goals (current goals can now be found in the care plan section) Progress towards PT goals:  Progressing toward goals    Frequency    Min 3X/week      PT Plan Discharge plan needs to be updated    Co-evaluation              AM-PAC PT "6 Clicks" Mobility   Outcome Measure  Help needed turning from your back to your side while in a flat bed without using bedrails?: A Little Help needed moving from lying on your back to sitting on the side of a flat bed without using bedrails?: A Lot Help needed moving to and from a bed to a chair (including a wheelchair)?: A Little Help needed standing up from a chair using your arms (e.g., wheelchair or bedside chair)?: A Little Help needed to walk in hospital room?: A Little Help needed climbing 3-5 steps with a railing? : A Lot 6 Click Score: 16    End of Session Equipment Utilized During Treatment: Gait belt Activity Tolerance: Patient tolerated treatment well Patient left: in chair;with call bell/phone within reach;with family/visitor present Nurse Communication: Mobility status PT Visit Diagnosis: Other abnormalities of gait and mobility (R26.89);Muscle weakness (generalized) (M62.81)     Time: 7017-7939 PT Time Calculation (min) (ACUTE ONLY): 34 min  Charges:  $Gait Training: 23-37 mins                     Tiffany Le M,PT Acute Rehab Services [phone] (pager)   Tiffany Le 10/24/2020, 2:35 PM

## 2020-10-24 NOTE — Progress Notes (Signed)
Hypoglycemic Event  CBG: 53  Treatment: Orange juice and lemon italian ice given  Symptoms: Asymptomatic  Follow-up CBG: Time: 0508 CBG Result: 96   Tiffany Le S Baltazar Najjar

## 2020-10-24 NOTE — Progress Notes (Addendum)
      [address]       [place] [address]             [phone]      15 Days Post-Op Procedure(s) (LRB): CORONARY ARTERY BYPASS GRAFTING (CABG), ON PUMP, TIMES FOUR, USING LEFT INTERNAL MAMMARY ARTERY AND LEFT RADIAL ARTERY (OPEN HARVEST) (N/A) RADIAL ARTERY HARVEST (Left) TRANSESOPHAGEAL ECHOCARDIOGRAM (TEE) (N/A) INDOCYANINE GREEN FLUORESCENCE IMAGING (ICG) (N/A) CLIPPING OF ATRIAL APPENDAGE USING ATRICURE 35MM CLIP (N/A)   Subjective:  Patient states she had a good day yesterday.  She states yesterday was the best she ever felt.  She was able to get up and go to the bathroom on her own last evening.  She even asked the nurses to move the scale a little further back so she could walk a little further.  Objective: Vital signs in last 24 hours: Temp:  [97.8 F (36.6 C)-98.4 F (36.9 C)] 97.9 F (36.6 C) (03/16 0342) Pulse Rate:  [68-81] 81 (03/16 0342) Cardiac Rhythm: Normal sinus rhythm (03/16 0700) Resp:  [11-17] 17 (03/16 0342) BP: (99-117)/(55-72) 117/68 (03/16 0342) SpO2:  [91 %-99 %] 99 % (03/16 0342) Weight:  [110.6 kg] 110.6 kg (03/16 0342)  Intake/Output from previous day: 03/15 0701 - 03/16 0700 In: 358 [P.O.:358] Out: 600 [Urine:600]  General appearance: alert, cooperative and no distress Heart: regular rate and rhythm Lungs: clear to auscultation bilaterally Abdomen: soft, non-tender; bowel sounds normal; no masses,  no organomegaly Extremities: edema trace Wound: clean and dry radial site, pravena in place  Lab Results: Recent Labs    10/23/20 0315 10/24/20 0409  WBC 8.8 7.5  HGB 8.2* 8.9*  HCT 25.3* 27.5*  PLT 385 385   BMET:  Recent Labs    10/23/20 0315 10/24/20 0409  NA 138 135  K 4.0 3.9  CL 98 96*  CO2 33* 33*  GLUCOSE 52* 54*  BUN 23* 21*  CREATININE 1.05* 0.95  CALCIUM 8.2* 8.4*    PT/INR: No results for input(s): LABPROT, INR in the last 72 hours. ABG    Component Value Date/Time   PHART 7.364  10/09/2020 2158   HCO3 23.9 10/09/2020 2158   TCO2 25 10/09/2020 2158   ACIDBASEDEF 1.0 10/09/2020 2158   O2SAT 69.9 10/24/2020 0409   CBG (last 3)  Recent Labs    10/24/20 0410 10/24/20 0508 10/24/20 0635  GLUCAP 53* 96 84    Assessment/Plan: S/P Procedure(s) (LRB): CORONARY ARTERY BYPASS GRAFTING (CABG), ON PUMP, TIMES FOUR, USING LEFT INTERNAL MAMMARY ARTERY AND LEFT RADIAL ARTERY (OPEN HARVEST) (N/A) RADIAL ARTERY HARVEST (Left) TRANSESOPHAGEAL ECHOCARDIOGRAM (TEE) (N/A) INDOCYANINE GREEN FLUORESCENCE IMAGING (ICG) (N/A) CLIPPING OF ATRIAL APPENDAGE USING ATRICURE 35MM CLIP (N/A)  1. CV- NSR, BP labile in the 90s at times- on Coreg, Bidil, Entresto- may benefit from reduction in anti-hypertensive medications, will defer to AHF 2. Pulm- no acute issues, continue IS 3. Renal-creatinine stable, weight is below baseline, on Spironolactone, Demadex 4. Neuro-MRA head and neck is normal, continue medications to assist with dizziness, continue PT/OT.. for ECHO once appropriate, continue DAPT 5. DM- hypoglycemia, persists will decrease Levemir down to 14 units BID 6. Dispo- patient feeling better, hopefully she is turning the corner, continue aggressive PT/OT, may benefit from adjustment in anti-hypertensives will defer to AHF   LOS: 22 days    Ellwood Handler, PA-C 10/24/2020 Agree with documentation; appreciate consultant's expertise. Heraclio Seidman Z. Orvan Seen, [place]

## 2020-10-24 NOTE — Progress Notes (Signed)
  Echocardiogram 2D Echocardiogram has been performed.  Tiffany Le 10/24/2020, 3:51 PM

## 2020-10-24 NOTE — Plan of Care (Signed)
  Problem: Education: Goal: Knowledge of General Education information will improve Description: Including pain rating scale, medication(s)/side effects and non-pharmacologic comfort measures Outcome: Progressing   Problem: Health Behavior/Discharge Planning: Goal: Ability to manage health-related needs will improve Outcome: Progressing   Problem: Clinical Measurements: Goal: Ability to maintain clinical measurements within normal limits will improve Outcome: Progressing Goal: Will remain free from infection Outcome: Progressing Goal: Diagnostic test results will improve Outcome: Progressing Goal: Respiratory complications will improve Outcome: Progressing Goal: Cardiovascular complication will be avoided Outcome: Progressing   Problem: Activity: Goal: Risk for activity intolerance will decrease Outcome: Progressing   Problem: Coping: Goal: Level of anxiety will decrease Outcome: Progressing   Problem: Elimination: Goal: Will not experience complications related to bowel motility Outcome: Progressing Goal: Will not experience complications related to urinary retention Outcome: Progressing   Problem: Education: Goal: Ability to demonstrate management of disease process will improve Outcome: Progressing Goal: Ability to verbalize understanding of medication therapies will improve Outcome: Progressing Goal: Individualized Educational Video(s) Outcome: Progressing   Problem: Activity: Goal: Capacity to carry out activities will improve Outcome: Progressing   Problem: Cardiac: Goal: Ability to achieve and maintain adequate cardiopulmonary perfusion will improve Outcome: Progressing   Problem: Education: Goal: Will demonstrate proper wound care and an understanding of methods to prevent future damage Outcome: Progressing Goal: Knowledge of disease or condition will improve Outcome: Progressing Goal: Knowledge of the prescribed therapeutic regimen will  improve Outcome: Progressing Goal: Individualized Educational Video(s) Outcome: Progressing   Problem: Activity: Goal: Risk for activity intolerance will decrease Outcome: Progressing   Problem: Cardiac: Goal: Will achieve and/or maintain hemodynamic stability Outcome: Progressing   Problem: Clinical Measurements: Goal: Postoperative complications will be avoided or minimized Outcome: Progressing   Problem: Respiratory: Goal: Respiratory status will improve Outcome: Progressing   Problem: Skin Integrity: Goal: Wound healing without signs and symptoms of infection Outcome: Progressing Goal: Risk for impaired skin integrity will decrease Outcome: Progressing   Problem: Urinary Elimination: Goal: Ability to achieve and maintain adequate renal perfusion and functioning will improve Outcome: Progressing   Problem: Education: Goal: Knowledge of disease or condition will improve Outcome: Progressing Goal: Knowledge of secondary prevention will improve Outcome: Progressing Goal: Knowledge of patient specific risk factors addressed and post discharge goals established will improve Outcome: Progressing Goal: Individualized Educational Video(s) Outcome: Progressing   Problem: Coping: Goal: Will verbalize positive feelings about self Outcome: Progressing Goal: Will identify appropriate support needs Outcome: Progressing   Problem: Health Behavior/Discharge Planning: Goal: Ability to manage health-related needs will improve Outcome: Progressing   Problem: Health Behavior/Discharge Planning: Goal: Ability to manage health-related needs will improve Outcome: Progressing   Problem: Self-Care: Goal: Ability to participate in self-care as condition permits will improve Outcome: Progressing Goal: Verbalization of feelings and concerns over difficulty with self-care will improve Outcome: Progressing Goal: Ability to communicate needs accurately will improve Outcome:  Progressing   Problem: Nutrition: Goal: Risk of aspiration will decrease Outcome: Progressing Goal: Dietary intake will improve Outcome: Progressing   Problem: Ischemic Stroke/TIA Tissue Perfusion: Goal: Complications of ischemic stroke/TIA will be minimized Outcome: Progressing

## 2020-10-25 LAB — CBC
HCT: 26.3 % — ABNORMAL LOW (ref 36.0–46.0)
Hemoglobin: 8.6 g/dL — ABNORMAL LOW (ref 12.0–15.0)
MCH: 27.4 pg (ref 26.0–34.0)
MCHC: 32.7 g/dL (ref 30.0–36.0)
MCV: 83.8 fL (ref 80.0–100.0)
Platelets: 326 10*3/uL (ref 150–400)
RBC: 3.14 MIL/uL — ABNORMAL LOW (ref 3.87–5.11)
RDW: 14.1 % (ref 11.5–15.5)
WBC: 7.9 10*3/uL (ref 4.0–10.5)
nRBC: 0 % (ref 0.0–0.2)

## 2020-10-25 LAB — BASIC METABOLIC PANEL
Anion gap: 8 (ref 5–15)
BUN: 19 mg/dL (ref 6–20)
CO2: 32 mmol/L (ref 22–32)
Calcium: 8.5 mg/dL — ABNORMAL LOW (ref 8.9–10.3)
Chloride: 97 mmol/L — ABNORMAL LOW (ref 98–111)
Creatinine, Ser: 1.06 mg/dL — ABNORMAL HIGH (ref 0.44–1.00)
GFR, Estimated: >60 mL/min (ref >60)
Glucose, Bld: 67 mg/dL — ABNORMAL LOW (ref 70–99)
Potassium: 4.1 mmol/L (ref 3.5–5.1)
Sodium: 137 mmol/L (ref 135–145)

## 2020-10-25 LAB — GLUCOSE, CAPILLARY
Glucose-Capillary: 117 mg/dL — ABNORMAL HIGH (ref 70–99)
Glucose-Capillary: 144 mg/dL — ABNORMAL HIGH (ref 70–99)
Glucose-Capillary: 207 mg/dL — ABNORMAL HIGH (ref 70–99)
Glucose-Capillary: 179 mg/dL — ABNORMAL HIGH (ref 70–99)

## 2020-10-25 LAB — COOXEMETRY PANEL
Carboxyhemoglobin: 1.4 % (ref 0.5–1.5)
Methemoglobin: 1 % (ref 0.0–1.5)
O2 Saturation: 63.8 %
Total hemoglobin: 8.7 g/dL — ABNORMAL LOW (ref 12.0–16.0)

## 2020-10-25 MED ORDER — ACETAMINOPHEN 325 MG PO TABS
650.0000 mg | ORAL_TABLET | Freq: Four times a day (QID) | ORAL | Status: DC | PRN
  Administered 2020-10-25 – 2020-10-26 (×3): 650 mg via ORAL
  Filled 2020-10-25 (×3): qty 2

## 2020-10-25 MED ORDER — ADULT MULTIVITAMIN W/MINERALS CH
1.0000 | ORAL_TABLET | Freq: Every day | ORAL | Status: DC
  Administered 2020-10-25 – 2020-10-26 (×2): 1 via ORAL
  Filled 2020-10-25 (×3): qty 1

## 2020-10-25 NOTE — Plan of Care (Signed)
  Problem: Education: Goal: Knowledge of General Education information will improve Description: Including pain rating scale, medication(s)/side effects and non-pharmacologic comfort measures Outcome: Progressing   Problem: Health Behavior/Discharge Planning: Goal: Ability to manage health-related needs will improve Outcome: Progressing   Problem: Clinical Measurements: Goal: Will remain free from infection Outcome: Progressing   Problem: Activity: Goal: Risk for activity intolerance will decrease Outcome: Progressing   Problem: Coping: Goal: Level of anxiety will decrease Outcome: Progressing   Problem: Elimination: Goal: Will not experience complications related to bowel motility Outcome: Progressing Goal: Will not experience complications related to urinary retention Outcome: Progressing   Problem: Education: Goal: Ability to demonstrate management of disease process will improve Outcome: Progressing Goal: Ability to verbalize understanding of medication therapies will improve Outcome: Progressing Goal: Individualized Educational Video(s) Outcome: Progressing   Problem: Activity: Goal: Capacity to carry out activities will improve Outcome: Progressing   Problem: Cardiac: Goal: Ability to achieve and maintain adequate cardiopulmonary perfusion will improve Outcome: Progressing   Problem: Education: Goal: Will demonstrate proper wound care and an understanding of methods to prevent future damage Outcome: Progressing Goal: Knowledge of disease or condition will improve Outcome: Progressing   Problem: Activity: Goal: Risk for activity intolerance will decrease Outcome: Progressing   Problem: Cardiac: Goal: Will achieve and/or maintain hemodynamic stability Outcome: Progressing   Problem: Clinical Measurements: Goal: Postoperative complications will be avoided or minimized Outcome: Progressing   Problem: Respiratory: Goal: Respiratory status will  improve Outcome: Progressing   Problem: Skin Integrity: Goal: Risk for impaired skin integrity will decrease Outcome: Progressing   Problem: Education: Goal: Knowledge of disease or condition will improve Outcome: Progressing Goal: Knowledge of secondary prevention will improve Outcome: Progressing   Problem: Coping: Goal: Will verbalize positive feelings about self Outcome: Progressing

## 2020-10-25 NOTE — Plan of Care (Signed)
  Problem: Clinical Measurements: Goal: Ability to maintain clinical measurements within normal limits will improve Outcome: Progressing Goal: Will remain free from infection Outcome: Progressing Goal: Respiratory complications will improve Outcome: Progressing Goal: Cardiovascular complication will be avoided Outcome: Progressing   

## 2020-10-25 NOTE — Progress Notes (Signed)
STROKE TEAM PROGRESS NOTE   HPI Briefly: Admitted for SOB, s/p CABG x4 Developed occipital headache primarily right sided with vertigo like symptoms on 3/14.  MRI shows pontine CVA INTERVAL HISTORY No acute events overnight.  Patient is sitting up in chair at bedside in good spirits reporting feeling much better today overall. Dizziness and vision disturbance are resolved. She was able to stand and brush her teeth and walk with therapists today.   Vitals:   10/24/20 1936 10/24/20 2328 10/25/20 0514 10/25/20 0744  BP: (!) 111/57 123/66 (!) 116/58 126/73  Pulse: 79 83 88 88  Resp: 12 16 17 15   Temp: 98 F (36.7 C) 98.6 F (37 C) 98.9 F (37.2 C) 98.8 F (37.1 C)  TempSrc: Oral Oral Oral Oral  SpO2: 91% 92% 92%   Weight:   109.3 kg   Height:       CBC:  Recent Labs  Lab 10/24/20 0409 10/25/20 0516  WBC 7.5 7.9  HGB 8.9* 8.6*  HCT 27.5* 26.3*  MCV 85.1 83.8  PLT 385 161   Basic Metabolic Panel:  Recent Labs  Lab 10/24/20 0409 10/25/20 0516  NA 135 137  K 3.9 4.1  CL 96* 97*  CO2 33* 32  GLUCOSE 54* 67*  BUN 21* 19  CREATININE 0.95 1.06*  CALCIUM 8.4* 8.5*   Lipid Panel: No results for input(s): CHOL, TRIG, HDL, CHOLHDL, VLDL, LDLCALC in the last 168 hours. HgbA1c: No results for input(s): HGBA1C in the last 168 hours. Urine Drug Screen: No results for input(s): LABOPIA, COCAINSCRNUR, LABBENZ, AMPHETMU, THCU, LABBARB in the last 168 hours.  Alcohol Level No results for input(s): ETH in the last 168 hours.  IMAGING past 24 hours ECHOCARDIOGRAM LIMITED  Result Date: 10/24/2020    ECHOCARDIOGRAM LIMITED REPORT   Patient Name:   Tiffany Le Date of Exam: 10/24/2020 Medical Rec #:  [id number]        Height:       64.0 in Accession #:    [id number]       Weight:       243.8 lb Date of Birth:  December 04, 1975        BSA:          2.128 m Patient Age:    45 years         BP:           128/71 mmHg Patient Gender: F                HR:           81 bpm. Exam Location:   Inpatient Procedure: Limited Echo and Intracardiac Opacification Agent Indications:    CHF  History:        Patient has prior history of Echocardiogram examinations, most                 recent 10/21/2020. CHF, CAD, Prior CABG, Stroke; Risk                 Factors:Hypertension, Diabetes and Dyslipidemia.  Sonographer:    Tiffany Le Referring Phys: [phone] Tiffany Le  Sonographer Comments: Limited study for apical thrombus only IMPRESSIONS  1. Limited study shows no evidence of apical thrombus. LVEF has improved to 50-55%.  2. Left ventricular ejection fraction, by estimation, is 50 to 55%. The left ventricle has low normal function. Conclusion(s)/Recommendation(s): No left ventricular mural or apical thrombus/thrombi. FINDINGS  Left Ventricle: Left ventricular ejection fraction, by estimation, is 50  to 55%. The left ventricle has low normal function. Definity contrast agent was given IV to delineate the left ventricular endocardial borders.  LV Wall Scoring: The mid and distal anterior septum, mid inferoseptal segment, apical anterior segment, and apex are hypokinetic. Tiffany Chiquito MD Electronically signed by Tiffany Chiquito MD Signature Date/Time: 10/24/2020/5:34:52 PM    Final    PHYSICAL EXAM Obese middle-aged African-American lady not in distress. GENERAL: Awake, alert in NAD HEENT: - Normocephalic and atraumatic, dry mm,  LUNGS - Normal respiratory effort. SaO2 CV - RRR on tele Ext: warm, well perfused, BLE pitting edema  NEURO:  Mental Status: AA&Ox3 Speech/Language: speech is clear and coherent.  Cranial Nerves:  II: PERRL_2___mm/brisk. visual fields full.  III, IV, VI: EOMI. Lid elevation symmetric and full.  V: Sensation is intact to light touch and symmetrical to face. Blinks to threat. VII: Face is symmetric resting and smiling. Able to puff cheeks and raise eyebrows.  VIII: Hearing intact to voice IX, X: Palate elevation is symmetric. Phonation normal.  XI: Normal  sternocleidomastoid and trapezius muscle strength XII: Tongue protrudes midline without fasciculations.   Motor: 4/5 strength is all muscle groups. Patient has some pain, moreso in the L arm and bilateral lower extremities post-op.  Tone is normal. Bulk is normal.  Sensation- Intact to light touch bilaterally in all four extremities. Extinction intact.   Coordination: FTN intact bilaterally.  DTRs: 2+ throughout.  Gait- deferred ASSESSMENT/PLAN   Tiffany Le is a 45yo PMHx significant for HTN, HLD, T2DM, CAD (severe, LHC 03/05/35), acute systolic/diastolic HF (LVEF 64-40% 3/47/42). Admitted 2/22 with progressive shortness of breath in the setting of  s/p recent LHC demonstrating severemultivesselCAD with recommendation for CABG vs. PCI.  Patient received CABG x4. This past week patient began having occipital headaches with photophobia and also reported significant nausea and feeling that "the room was spinning." Patient appears to have a neuro exam with no focal deficit, but patient history is concerning for symptoms of ischemia in the pons as patient reports dizziness worsened with motion. Patient had an suggesting that etiology of strokes could be cardioembolic.     Stroke Subacute  patchy ischemic infarcts  involving the bilateral cerebral white matter likely periprocedural from CABG and acute pontine lacunar infarct -MRI Brain Patchy multifocal areas of diffusion abnormality involving the bilateral cerebral white matter and pons as above, consistent with acute to early subacute ischemic infarcts. No associated hemorrhage or mass effect.Underlying mild chronic microvascular ischemic disease with small remote lacunar infarct involving the right thalamus. -MRA Head and Neck: No LVO, No other significant findings  -2D Echo: 10/11/2020 EF 40-45%,no shunt.  hypokinesis of the LV wall noted -Tele tech reviewed entire admission for atrial fibrillation without any found as reported by Tiffany Hayward, RN.  -LDL  91 -HgbA1c 7.5 -VTE prophylaxis - on lovenox PPX -On heart healthy diet -No anticoagulants PTA. On ASA 81mg  for a short while prior to admission. ON ASA 81mg  and Plavix 75mg  daily x 3 weeks then ASA alone.  -Therapy recommendations:  Cleared for home -Disposition:  Cleared for home  Hypertension . Permissive hypertension (OK if < 220/120) but gradually normalize in 5-7 days . Long-term BP goal normotensive  Hyperlipidemia  LDL 91, goal < 70  High intensity statin: Lipitor 80mg  daily   Continue statin at discharge  Diabetes type II Uncontrolled  HgbA1c 7.5, goal < 7.0  CBGs Recent Labs    10/24/20 1618 10/24/20 2102 10/25/20 0654  GLUCAP 86 126* 117*  SSI  Other Stroke Risk Factors  Obesity, Body mass index is 41.36 kg/m., BMI >/= 30 associated with increased stroke risk, recommend weight loss, diet and exercise as appropriate   Coronary artery disease  Congestive heart failure  Other Active Problems    10/25/2020 8:00 AM  Patient is doing well. Recommend DC home on aspirin and plavix given recent CABG. F/u as outpatient with stroke clinic in 6 weeks.  Stroke team will sign off. Tiffany Contras, MD  To contact Stroke Continuity provider, please refer to [email or web address] After hours, contact General Neurology

## 2020-10-25 NOTE — Progress Notes (Signed)
CARDIAC REHAB PHASE I   PRE:  Rate/Rhythm: 85 SR    BP: sitting 94/54    SaO2: 93 RA, 98 2L  MODE:  Ambulation: 100 ft   POST:  Rate/Rhythm: 97 SR    BP: sitting 118/68     SaO2: 94 RA  Pt in bed on arrival, willing to walk. Needed max assist to go from lying to EOB pushing from shoulders. Stands independently, walked with RW. To BSC then ambulated. C/o feeling weak at door therefore sat and rested 4 min. BP 111/69, saO2 94 RA. Able to walk hall 100 ft with several short standing rest stops. Followed with recliner but did not sit again until room. Left in recliner, VSS. Pt practiced IS, 300 mL.  [place], ACSM 10/25/2020 1:36 PM

## 2020-10-25 NOTE — Progress Notes (Addendum)
Initial Nutrition Assessment  DOCUMENTATION CODES:  Morbid obesity  INTERVENTION:  Recommend liberalizing diet to low sodium and continuing fluid restriction to promote PO intake.  Add Magic cup TID with meals, each supplement provides 290 kcal and 9 grams of protein.  Add MVI with minerals daily.  NUTRITION DIAGNOSIS:  Inadequate oral intake related to poor appetite as evidenced by meal completion < 50%,per patient/family report.  GOAL:  Patient will meet greater than or equal to 90% of their needs  MONITOR:  PO intake,Supplement acceptance,Weight trends  REASON FOR ASSESSMENT:  LOS    ASSESSMENT:  45 yo female with a PMH of HTN, HLD, iron deficiency anemia, T2DM, diabetic neuropathy, and CAD who presents with acute systolic CHF. 3/1 - s/p CABG x4; left Radial Harvest 3/8 - s/p left thoracentesis w/ removal of 100 mL light red fluid. 3/14 - Brain MRI shows acute to early sub acute ischemic infarcts, and a remote lacunar infarct involving the right thalamus  Spoke with pt at bedside. Pt reports eating okay, her appetite is getting better - she is not the biggest fan of the food and her lack of choices. She specifically does not like the breakfast, and has been eating cereal and fruit for that meal. For lunch and dinner, she is having a lot of chicken meals. Per Epic, eating between 20-90% of documented meals, mostly 50% or less.  She endorses some weight loss while being in the hospital. This is likely fluid d/t very mild changes in fat and muscle stores on exam - mild wasting in cheeks and around her clavicle.  She does not like Ensure, but she is open to having Magic Cups with meals. Encouraged PO and MC intake to promote healing and to meet increased protein needs.  Relevant Medications: dulcolax, colace, SSI, Levemir, Protonix, spironolactone Labs: reviewed; Glucose 117 HbA1c: 7.5% (10/2020)  NUTRITION - FOCUSED PHYSICAL EXAM: Flowsheet Row Most Recent Value  Orbital  Region No depletion  Upper Arm Region No depletion  Thoracic and Lumbar Region No depletion  Buccal Region Mild depletion  Temple Region No depletion  Clavicle Bone Region No depletion  Clavicle and Acromion Bone Region Mild depletion  Scapular Bone Region No depletion  Dorsal Hand No depletion  Patellar Region No depletion  Anterior Thigh Region No depletion  Posterior Calf Region No depletion  Edema (RD Assessment) Mild  Hair Reviewed  Eyes Reviewed  [pale conjunctiva]  Mouth Reviewed  Skin Reviewed  Nails Reviewed     Diet Order:   Diet Order            Diet heart healthy/carb modified Room service appropriate? Yes; Fluid consistency: Thin; Fluid restriction: 1500 mL Fluid  Diet effective now                EDUCATION NEEDS:  Education needs have been addressed  Skin:  Skin Assessment: Reviewed RN Assessment  Last BM:  10/20/20 - Type 4  Height:  Ht Readings from Last 1 Encounters:  10/20/20 5\' 4"  (1.626 m)   Weight:  Wt Readings from Last 1 Encounters:  10/25/20 109.3 kg   Ideal Body Weight:  54.5 kg  BMI:  Body mass index is 41.36 kg/m.  Estimated Nutritional Needs:  Kcal:  2100-2300 Protein:  100-115 grams Fluid:  1500 ml  Derrel Nip, RD, LDN Registered Dietitian After Hours/Weekend Pager # in [place]

## 2020-10-25 NOTE — Progress Notes (Addendum)
Patient ID: [name], female   DOB: 09-28-1975, 45 y.o.   MRN: [id number]     Advanced Heart Failure Rounding Note  PCP-Cardiologist: Quay Burow, MD   Subjective:    10/09/20 S/P CABG x4  Left Radial Harvest 3/8 s/p left thoracentesis w/ removal of 100 mL light red fluid. 3/14 Brain MRI shows acute to early sub acute ischemic infarcts, and a remote lacunar infarct involving the right thalamus  Co-ox 64%.  CVP 9. No dyspnea.   Feels better today. Has slight HA but improving after Tylenol. Dizziness has resolved.    Echo post-op EF slightly better 40-45%. RV mildly reduced.  Repeat Echo Limited 3/16 EF improved 50-55%, no LA/LV thrombus   Objective:   Weight Range: 109.3 kg Body mass index is 41.36 kg/m.   Vital Signs:   Temp:  [97.9 F (36.6 C)-98.9 F (37.2 C)] 98.8 F (37.1 C) (03/17 0744) Pulse Rate:  [78-88] 88 (03/17 0744) Resp:  [12-17] 15 (03/17 0744) BP: (111-129)/(57-77) 126/73 (03/17 0744) SpO2:  [91 %-99 %] 92 % (03/17 0514) Weight:  [109.3 kg] 109.3 kg (03/17 0514) Last BM Date: 10/20/20  Weight change: Filed Weights   10/22/20 0500 10/24/20 0342 10/25/20 0514  Weight: 111.9 kg 110.6 kg 109.3 kg    Intake/Output:   Intake/Output Summary (Last 24 hours) at 10/25/2020 0753 Last data filed at 10/25/2020 0514 Gross per 24 hour  Intake --  Output 250 ml  Net -250 ml      Physical Exam   CVP 9  General:  Well appearing, sitting up in chair. No respiratory difficulty HEENT: normal Neck: supple. Thick neck, JVD not well visualized. Carotids 2+ bilat; no bruits. No lymphadenopathy or thyromegaly appreciated. Cor: PMI nondisplaced. Regular rate & rhythm. No rubs, gallops or murmurs. + sternal wound vac  Lungs: clear, no wheezing  Abdomen: soft, nontender, nondistended. No hepatosplenomegaly. No bruits or masses. Good bowel sounds. Extremities: no cyanosis, clubbing, rash, edema Neuro: alert & oriented x 3, cranial nerves grossly intact. moves  all 4 extremities w/o difficulty. Affect pleasant.    Telemetry   NSR 80s no Afib.    Labs    CBC Recent Labs    10/24/20 0409 10/25/20 0516  WBC 7.5 7.9  HGB 8.9* 8.6*  HCT 27.5* 26.3*  MCV 85.1 83.8  PLT 385 010   Basic Metabolic Panel Recent Labs    10/24/20 0409 10/25/20 0516  NA 135 137  K 3.9 4.1  CL 96* 97*  CO2 33* 32  GLUCOSE 54* 67*  BUN 21* 19  CREATININE 0.95 1.06*  CALCIUM 8.4* 8.5*   Liver Function Tests No results for input(s): AST, ALT, ALKPHOS, BILITOT, PROT, ALBUMIN in the last 72 hours. No results for input(s): LIPASE, AMYLASE in the last 72 hours. Cardiac Enzymes No results for input(s): CKTOTAL, CKMB, CKMBINDEX, TROPONINI in the last 72 hours.  BNP: BNP (last 3 results) Recent Labs    10/02/20 0516  BNP 947.1*    ProBNP (last 3 results) No results for input(s): PROBNP in the last 8760 hours.   D-Dimer No results for input(s): DDIMER in the last 72 hours. Hemoglobin A1C No results for input(s): HGBA1C in the last 72 hours. Fasting Lipid Panel No results for input(s): CHOL, HDL, LDLCALC, TRIG, CHOLHDL, LDLDIRECT in the last 72 hours. Thyroid Function Tests No results for input(s): TSH, T4TOTAL, T3FREE, THYROIDAB in the last 72 hours.  Invalid input(s): FREET3  Other results:   Imaging  ECHOCARDIOGRAM LIMITED  Result Date: 10/24/2020    ECHOCARDIOGRAM LIMITED REPORT   Patient Name:   SAUL DORSI Date of Exam: 10/24/2020 Medical Rec #:  [id number]        Height:       64.0 in Accession #:    [id number]       Weight:       243.8 lb Date of Birth:  1975/10/20        BSA:          2.128 m Patient Age:    66 years         BP:           128/71 mmHg Patient Gender: F                HR:           81 bpm. Exam Location:  Inpatient Procedure: Limited Echo and Intracardiac Opacification Agent Indications:    CHF  History:        Patient has prior history of Echocardiogram examinations, most                 recent 10/21/2020. CHF,  CAD, Prior CABG, Stroke; Risk                 Factors:Hypertension, Diabetes and Dyslipidemia.  Sonographer:    Dustin Flock Referring Phys: [phone] AMY D CLEGG  Sonographer Comments: Limited study for apical thrombus only IMPRESSIONS  1. Limited study shows no evidence of apical thrombus. LVEF has improved to 50-55%.  2. Left ventricular ejection fraction, by estimation, is 50 to 55%. The left ventricle has low normal function. Conclusion(s)/Recommendation(s): No left ventricular mural or apical thrombus/thrombi. FINDINGS  Left Ventricle: Left ventricular ejection fraction, by estimation, is 50 to 55%. The left ventricle has low normal function. Definity contrast agent was given IV to delineate the left ventricular endocardial borders.  LV Wall Scoring: The mid and distal anterior septum, mid inferoseptal segment, apical anterior segment, and apex are hypokinetic. Eleonore Chiquito MD Electronically signed by Eleonore Chiquito MD Signature Date/Time: 10/24/2020/5:34:52 PM    Final      Medications:     Scheduled Medications: . aspirin  81 mg Oral Daily  . atorvastatin  80 mg Oral Daily  . bisacodyl  10 mg Oral Daily  . carvedilol  12.5 mg Oral BID WC  . Chlorhexidine Gluconate Cloth  6 each Topical Daily  . clopidogrel  75 mg Oral Daily  . colchicine  0.6 mg Oral BID  . dapagliflozin propanediol  10 mg Oral Daily  . docusate sodium  200 mg Oral Daily  . enoxaparin (LOVENOX) injection  40 mg Subcutaneous Q24H  . gabapentin  100 mg Oral TID  . insulin aspart  0-24 Units Subcutaneous TID WC  . insulin detemir  8 Units Subcutaneous BID  . isosorbide-hydrALAZINE  1 tablet Oral TID  . pantoprazole  40 mg Oral Daily  . sacubitril-valsartan  1 tablet Oral BID  . spironolactone  25 mg Oral Daily  . torsemide  40 mg Oral Daily    Infusions: . lactated ringers      PRN Medications: acetaminophen, dextrose, lactated ringers, lactulose, meclizine, metoprolol tartrate, ondansetron (ZOFRAN) IV, sodium  chloride flush, sodium chloride flush, traMADol   Assessment/Plan   1. CAD: S/P CABG x4 on 10/09/20.  - ASA 81 mg + Plavix 75 mg  - Atorvastatin 80 mg  - No chest pain 2. Acute systolic CHF: Echo (2/35) with  EF 35-40%, mild LVH (similar this admission and last).  Ischemic CM now s/p CABG. Repeat echo 3/3 LVEF improved 40-45%, RV mildly reduced. Repeat limited echo 3/16 EF 50-55%. Now off milrinone. Co-ox stable at 64% .  - Volume status stable. CVP 9  - Continue torsemide 40 daily (can titrate as needed) - Continue coreg at 12.5 mg twice a day.  - Continue Farxiga 10 mg will get 30 day free card  - Continue spironolactone 25 mg daily.  - Continue Bidil 1 tab tid --> $ 10.00 - Continue Entresto 49-51 mg twice a day. Plan increase to 97-103 bid soon if BP allows. 3. ID:  Concern for possible aspiration PNA pre-op.  CXR with bilateral infiltrates initially, now clearing with RUL infiltrate only (suspect she asymmetric edema or mucus plugging also). Afebrile. Cultures NGTD.  - On cefepime prior to surgery.  - Encouraged pulmonary toilet 4. Type 2 diabetes: Control improved recently. Hgb A1c 7.5 - Now on Farxiga.  - No change 5. VT: Noted in ER though strips not available.  -No VT noted. Off amio      6. AKI: Resolved.   7. Acute Gout -Uric acid 9.1.  - improved with colchicine 8. Deconditioning - continue to ambulate - PT recommending SNF but no insurance. Plan home w/ HH.  9. CVA: Brain MRI completed given persistent dizziness, showing acute to early sub acute ischemic infarcts, and a remote lacunar infarct involving the right thalamus - Echo w/ no cardiac source  - Neuro recommending 30 day monitor to r/o PAF. If unremarkable, refer to EP for Loop recorder   Advanced Heart Failure Team Pager [phone] (M-F; 7a - 5p)  Please contact [facility] for night-coverage after hours (4p -7a ) and weekends on [email or web address]  Lyda Jester PA-C  10/25/2020 7:53 AM  Patient seen and  examined with the above-signed Advanced Practice Provider and/or Housestaff. I personally reviewed laboratory data, imaging studies and relevant notes. I independently examined the patient and formulated the important aspects of the plan. I have edited the note to reflect any of my changes or salient points. I have personally discussed the plan with the patient and/or family.  Feeling better. Volume status ok. Dizziness improved. Co-ox 64%  General:  Lying in bed  No resp difficulty HEENT: normal Neck: supple. no JVD. Carotids 2+ bilat; no bruits. No lymphadenopathy or thryomegaly appreciated. Cor: PMI nondisplaced. Wound vac ok. Regular rate & rhythm. No rubs, gallops or murmurs. Lungs: clear Abdomen: obese soft, nontender, nondistended. No hepatosplenomegaly. No bruits or masses. Good bowel sounds. Extremities: no cyanosis, clubbing, rash, trace edema Neuro: alert & orientedx3, cranial nerves grossly intact. moves all 4 extremities w/o difficulty. Affect pleasant  She continues to improve. Stable from HF standpoint. Neuro status improving. Continue current HF meds.   Glori Bickers, MD  9:10 AM

## 2020-10-25 NOTE — Progress Notes (Signed)
Physical Therapy Treatment Patient Details Name: Tiffany Le MRN: [id number] DOB: 17-Oct-1975 Today's Date: 10/25/2020    History of Present Illness Pt is a 45 y.o. female recently admitted 2/14-2/17/22 with NSTEMI and LHC with severe CAD, recommended CABG vs. PCI, but left AMA, pt now readmitted 10/02/20 with respiratory distress; ETT 2/22-2/23. Pt with acute hypoxic respiratory failure likely secondary to acute cardiogenic pulmonary edema. S/p CABGx4 3/1. Pt underwent left thoracentesis 3/8. Pt with VAC placed 3/9.  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 PMH includes HTN, DM2, CAD, HF.    PT Comments    Pt very pleasant without dizziness or knee pain today and reports only slight HA this am. Pt with significantly improved mobility, transfers and function with increased gait tolerance and strength. Pt does still plan to D/C to mom's house and does not have to go up stairs there. Educated for progressive HEP and walking as well as all sternal precautions.   HR 82-88    Follow Up Recommendations  Home health PT;Supervision/Assistance - 24 hour     Equipment Recommendations  Rolling walker with 5" wheels;3in1 (PT)    Recommendations for Other Services       Precautions / Restrictions Precautions Precautions: Fall;Sternal;Other (comment) Precaution Comments: VAC    Mobility  Bed Mobility Overal bed mobility: Needs Assistance Bed Mobility: Sit to Supine       Sit to supine: Mod assist   General bed mobility comments: mod assist to lift legs to surface with good control of trunk to return to bed    Transfers Overall transfer level: Needs assistance     Sit to Stand: Supervision         General transfer comment: supervision for lines with good hand placement and strength with pt able to stand from bed and perform 5  repeated sit to stands from bed in 30 sec  Ambulation/Gait Ambulation/Gait assistance: Supervision Gait Distance (Feet): 250 Feet Assistive device: Rolling walker (2 wheeled) Gait Pattern/deviations: Step-through pattern;Decreased stride length   Gait velocity interpretation: >2.62 ft/sec, indicative of community ambulatory General Gait Details: pt with RW for steadiness with increased speed and stability with 3 brief standing rest breaks during gait   Stairs             Wheelchair Mobility    Modified Rankin (Stroke Patients Only)       Balance Overall balance assessment: Mild deficits observed, not formally tested   Sitting balance-Leahy Scale: Good     Standing balance support: Bilateral upper extremity supported Standing balance-Leahy Scale: Poor Standing balance comment: reliant on UE support of RW                            Cognition Arousal/Alertness: Awake/alert Behavior During Therapy: WFL for tasks assessed/performed Overall Cognitive Status: Impaired/Different from baseline Area of Impairment: Memory                     Memory: Decreased recall of precautions         General Comments: pt with ability to recall 3/4 precautions today but demonstrates understanding with mobility      Exercises General Exercises - Lower Extremity Long Arc Quad: AROM;Both;Seated;20 reps    General Comments        Pertinent Vitals/Pain Pain Assessment: No/denies pain    Home Living  Prior Function            PT Goals (current goals can now be found in the care plan section) Progress towards PT goals: Progressing toward goals    Frequency    Min 3X/week      PT Plan Current plan remains appropriate    Co-evaluation              AM-PAC PT "6 Clicks" Mobility   Outcome Measure  Help needed turning from your back to your side while in a flat bed without using bedrails?: A Little Help needed  moving from lying on your back to sitting on the side of a flat bed without using bedrails?: A Little Help needed moving to and from a bed to a chair (including a wheelchair)?: A Little Help needed standing up from a chair using your arms (e.g., wheelchair or bedside chair)?: None Help needed to walk in hospital room?: A Little Help needed climbing 3-5 steps with a railing? : A Lot 6 Click Score: 18    End of Session   Activity Tolerance: Patient tolerated treatment well Patient left: in bed;with call bell/phone within reach Nurse Communication: Mobility status PT Visit Diagnosis: Other abnormalities of gait and mobility (R26.89);Muscle weakness (generalized) (M62.81)     Time: 8786-7672 PT Time Calculation (min) (ACUTE ONLY): 25 min  Charges:  $Gait Training: 8-22 mins $Therapeutic Exercise: 8-22 mins                     Criselda Starke P, PT Acute Rehabilitation Services Pager: [phone] Office: [place] 10/25/2020, 11:29 AM

## 2020-10-25 NOTE — Progress Notes (Deleted)
Cardiology Clinic Note   Patient Name: Tiffany Le Date of Encounter: 10/25/2020  Primary Care Provider:  Azzie Glatter, FNP Primary Cardiologist:  Quay Burow, MD  Patient Profile    Tiffany Le 45 year old female presents the clinic today for follow-up evaluation of her essential hypertension and coronary artery disease.  Past Medical History    Past Medical History:  Diagnosis Date  . Coronary artery disease   . Diabetes mellitus without complication ([place])   . Diabetic neuropathy ([place]) 02/2020  . Hyperlipidemia age 61  . Hypertension age 66  . Hypocalcemia 11/2019  . Hypokalemia 11/2019  . Iron deficiency anemia   . Obesity   . Proteinuria 12/09/2019   Past Surgical History:  Procedure Laterality Date  . CARDIAC CATHETERIZATION  09/25/2020  . CESAREAN SECTION  3/96  . CLIPPING OF ATRIAL APPENDAGE N/A 10/09/2020   Procedure: CLIPPING OF ATRIAL APPENDAGE USING ATRICURE 35MM CLIP;  Surgeon: [name], MD;  Location: [facility];  Service: Open Heart Surgery;  Laterality: N/A;  . CORONARY ARTERY BYPASS GRAFT N/A 10/09/2020   Procedure: CORONARY ARTERY BYPASS GRAFTING (CABG), ON PUMP, TIMES FOUR, USING LEFT INTERNAL MAMMARY ARTERY AND LEFT RADIAL ARTERY (OPEN HARVEST);  Surgeon: [name], MD;  Location: [facility];  Service: Open Heart Surgery;  Laterality: N/A;  POSSIBLE BIMA  . IR THORACENTESIS ASP PLEURAL SPACE W/IMG GUIDE  10/16/2020  . LEFT HEART CATH AND CORONARY ANGIOGRAPHY N/A 09/25/2020   Procedure: LEFT HEART CATH AND CORONARY ANGIOGRAPHY;  Surgeon: Leonie Man, MD;  Location: [facility];  Service: Cardiovascular;  Laterality: N/A;  . ORIF FEMUR FRACTURE    . RADIAL ARTERY HARVEST Left 10/09/2020   Procedure: RADIAL ARTERY HARVEST;  Surgeon: [name], MD;  Location: [facility];  Service: Open Heart Surgery;  Laterality: Left;  . TEE WITHOUT CARDIOVERSION N/A 10/09/2020   Procedure: TRANSESOPHAGEAL ECHOCARDIOGRAM (TEE);  Surgeon:  [name], MD;  Location: [facility];  Service: Open Heart Surgery;  Laterality: N/A;    Allergies  Allergies  Allergen Reactions  . Penicillins Rash    Historical Has patient had a PCN reaction causing immediate rash, facial/tongue/throat swelling, SOB or lightheadedness with hypotension: No Has patient had a PCN reaction causing severe rash involving mucus membranes or skin necrosis: No Has patient had a PCN reaction that required hospitalization: No Has patient had a PCN reaction occurring within the last 10 years: No If all of the above answers are "NO", then may proceed with Cephalosporin use.    History of Present Illness    [name] is a PMH of essential hypertension, NSTEMI status post CABG x4 on 10/09/2020, pulmonary edema, acute respiratory failure with hypoxemia, diabetes mellitus, iron deficiency anemia, hyperlipidemia, and morbid obesity.  She presents the clinic today for follow-up evaluation states***  *** denies chest pain, shortness of breath, lower extremity edema, fatigue, palpitations, melena, hematuria, hemoptysis, diaphoresis, weakness, presyncope, syncope, orthopnea, and PND.   Home Medications    Prior to Admission medications   Medication Sig Start Date End Date Taking? Authorizing Provider  albuterol (PROVENTIL HFA) 108 (90 Base) MCG/ACT inhaler Inhale 2 puffs into the lungs every 6 (six) hours as needed for wheezing. 09/12/20   Azzie Glatter, FNP  amLODipine (NORVASC) 10 MG tablet Take 10 mg by mouth daily.    [provider]  aspirin EC 81 MG tablet Take 81 mg by mouth daily.    [provider]  atorvastatin (  LIPITOR) 80 MG tablet Take 1 tablet (80 mg total) by mouth daily. 09/27/20 10/27/20  Cheryln Manly, NP  carvedilol (COREG) 12.5 MG tablet Take 1 tablet (12.5 mg total) by mouth 2 (two) times daily with a meal. 10/19/20   Barrett, Erin R, PA-C  clopidogrel (PLAVIX) 75 MG tablet Take 1 tablet (75 mg total) by mouth daily.  10/19/20   Barrett, Erin R, PA-C  cyclobenzaprine (FLEXERIL) 10 MG tablet Take 1 tablet (10 mg total) by mouth 2 (two) times daily as needed for muscle spasms. 12/25/19   Azzie Glatter, FNP  dapagliflozin propanediol (FARXIGA) 10 MG TABS tablet Take 1 tablet (10 mg total) by mouth daily. 10/19/20   Barrett, Erin R, PA-C  furosemide (LASIX) 40 MG tablet Take 1 tablet (40 mg total) by mouth daily. 10/19/20   Barrett, Erin R, PA-C  gabapentin (NEURONTIN) 100 MG capsule Take 1 capsule (100 mg total) by mouth 3 (three) times daily. 08/21/20   Azzie Glatter, FNP  glipiZIDE (GLUCOTROL) 10 MG tablet TAKE 1 TABLET BY MOUTH TWICE DAILY BEFORE A MEAL Patient taking differently: Take 10 mg by mouth 2 (two) times daily before a meal. 12/25/19   Azzie Glatter, FNP  hydrochlorothiazide (HYDRODIURIL) 25 MG tablet Take 25 mg by mouth daily.    [provider]  insulin glargine (LANTUS SOLOSTAR) 100 UNIT/ML Solostar Pen Inject 35 Units into the skin daily. 12/25/19   Azzie Glatter, FNP  Insulin Pen Needle 29G X 5MM MISC Use with insulin 08/21/20   Azzie Glatter, FNP  isosorbide-hydrALAZINE (BIDIL) 20-37.5 MG tablet Take 1 tablet by mouth 3 (three) times daily. 10/19/20   [name], MD  lisinopril (ZESTRIL) 10 MG tablet Take 1 tablet (10 mg total) by mouth daily. 12/25/19 12/24/20  Azzie Glatter, FNP  metoprolol tartrate (LOPRESSOR) 50 MG tablet Take 1 tablet (50 mg total) by mouth daily. 09/27/20 10/27/20  Cheryln Manly, NP  nitroGLYCERIN (NITROSTAT) 0.4 MG SL tablet DISSOLVE 1 TABLET UNDER THE TONGUE EVERY 5 MINUTES FOR 3 DOSES AS NEEDED FOR CHEST PAIN 10/08/20   Reino Bellis B, NP  oxyCODONE (OXY IR/ROXICODONE) 5 MG immediate release tablet Take 1 tablet (5 mg total) by mouth every 4 (four) hours as needed for severe pain. 10/19/20   Barrett, Erin R, PA-C  sacubitril-valsartan (ENTRESTO) 49-51 MG Take 1 tablet by mouth 2 (two) times daily. 10/19/20   Barrett, Lodema Hong, PA-C   spironolactone (ALDACTONE) 25 MG tablet Take 1 tablet (25 mg total) by mouth daily. 10/19/20   Barrett, Lodema Hong, PA-C    Family History    Family History  Problem Relation Age of Onset  . Heart disease Mother        CABG <50  . Hypertension Mother   . Diabetes Mother   . Hyperlipidemia Mother   . Heart disease Father   . Hyperlipidemia Father   . Hypertension Father   . Early death Father   . Healthy Daughter   . Diabetes Maternal Aunt   . Cancer Maternal Grandmother        stomach cancer  . Diabetes Maternal Aunt   . Cancer Cousin        colon cancer (dx'd 53)   She indicated that her mother is alive. She indicated that her father is deceased. She indicated that the status of her maternal grandmother is unknown. She indicated that her daughter is alive. She indicated that the status of her cousin is  unknown.  Social History    Social History   Socioeconomic History  . Marital status: Single    Spouse name: Not on file  . Number of children: Not on file  . Years of education: Not on file  . Highest education level: Not on file  Occupational History  . Occupation: Event organiser  Tobacco Use  . Smoking status: Never Smoker  . Smokeless tobacco: Never Used  Vaping Use  . Vaping Use: Never used  Substance and Sexual Activity  . Alcohol use: No  . Drug use: No  . Sexual activity: Not Currently    Partners: Male    Comment: husband currently in [place], returning 02/2013  Other Topics Concern  . 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  Lives with 41 year old daughter, 1 cat Husband is living in [place] (citizen there), trying to come to [place] (has been delayed)   Social Determinants of Radio broadcast assistant Strain: High Risk  . Difficulty of Paying Living Expenses: Hard  Food Insecurity: Food Insecurity Present  . Worried About Charity fundraiser in the Last Year: Sometimes true  . Ran Out of  Food in the Last Year: Sometimes true  Transportation Needs: No Transportation Needs  . Lack of Transportation (Medical): No  . Lack of Transportation (Non-Medical): No  Physical Activity: Not on file  Stress: Stress Concern Present  . Feeling of Stress : Rather much  Social Connections: Not on file  Intimate Partner Violence: Not on file     Review of Systems    General:  No chills, fever, night sweats or weight changes.  Cardiovascular:  No chest pain, dyspnea on exertion, edema, orthopnea, palpitations, paroxysmal nocturnal dyspnea. Dermatological: No rash, lesions/masses Respiratory: No cough, dyspnea Urologic: No hematuria, dysuria Abdominal:   No nausea, vomiting, diarrhea, bright red blood per rectum, melena, or hematemesis Neurologic:  No visual changes, wkns, changes in mental status. All other systems reviewed and are otherwise negative except as noted above.  Physical Exam    VS:  LMP  (LMP Unknown)  , BMI There is no height or weight on file to calculate BMI. GEN: Well nourished, well developed, in no acute distress. HEENT: normal. Neck: Supple, no JVD, carotid bruits, or masses. Cardiac: RRR, no murmurs, rubs, or gallops. No clubbing, cyanosis, edema.  Radials/DP/PT 2+ and equal bilaterally.  Respiratory:  Respirations regular and unlabored, clear to auscultation bilaterally. GI: Soft, nontender, nondistended, BS + x 4. MS: no deformity or atrophy. Skin: warm and dry, no rash. Neuro:  Strength and sensation are intact. Psych: Normal affect.  Accessory Clinical Findings    Recent Labs: 12/09/2019: TSH 1.610 10/02/2020: B Natriuretic Peptide 947.1 10/10/2020: Magnesium 3.0 10/15/2020: ALT 25 10/25/2020: BUN 19; Creatinine, Ser 1.06; Hemoglobin 8.6; Platelets 326; Potassium 4.1; Sodium 137   Recent Lipid Panel    Component Value Date/Time   CHOL 131 09/25/2020 1418   CHOL 165 12/09/2019 1509   TRIG 57 10/03/2020 0852   HDL 30 (L) 09/25/2020 1418   HDL 31 (L)  12/09/2019 1509   CHOLHDL 4.4 09/25/2020 1418   VLDL 10 09/25/2020 1418   LDLCALC 91 09/25/2020 1418   LDLCALC 123 (H) 12/09/2019 1509    ECG personally reviewed by me today- *** - No acute changes  Echocardiogram 10/11/2020 IMPRESSIONS    1. Difficult study due to poor acoustic windows and definity  not  administered.  2. The left ventricular ejection fraction, by estimation, is 40 to 45%.  The left ventricle has mildly decreased function.  3. Based on limited views, there is severe hypokinesis of all septal  segments, the mid-to-apical anterior LV segments, and apex.  4. There is mild asymmetric left ventricular hypertrophy of the posterior  LV segment.  5. Left ventricular diastolic parameters are indeterminate due to  arrhythmia. Elevated left atrial pressure.  6. Right ventricular systolic function is mildly reduced. The right  ventricular size is normal.  7. The mitral valve is normal in structure. Mild mitral valve  regurgitation.  8. The aortic valve is tricuspid. Aortic valve regurgitation is not  visualized. No aortic stenosis is present.   Comparison(s): Compared to TTE on 10/02/20, the LVEF has improved to  40-45% with improved wall motion based on limited views.   Echocardiogram 10/24/2020 IMPRESSIONS    1. Limited study shows no evidence of apical thrombus. LVEF has improved  to 50-55%.  2. Left ventricular ejection fraction, by estimation, is 50 to 55%. The  left ventricle has low normal function.   Conclusion(s)/Recommendation(s): No left ventricular mural or apical  thrombus/thrombi.   FINDINGS  Left Ventricle: Left ventricular ejection fraction, by estimation, is 50  to 55%. The left ventricle has low normal function. Definity contrast  agent was given IV to delineate the left ventricular endocardial borders.   Assessment & Plan   1.  Coronary artery disease status post CABG x4 10/09/2020-healing well and progressing with physical activity.   Reports compliance with sternal precautions. Continue*** Heart healthy low-sodium diet-salty 6 given Increase physical activity as tolerated  Essential hypertension-BP today*** Continue*** Heart healthy low-sodium diet-salty 6 given Increase physical activity as tolerated  Hyperlipidemia-09/25/2020: Cholesterol 131; HDL 30; LDL Cholesterol 91; VLDL 10 10/03/2020: Triglycerides 57 Continue*** Heart healthy low-sodium high-fiber diet.   Increase physical activity as tolerated  Disposition: Follow-up with Dr. Gwenlyn Found in 3 months.   Jossie Ng. Cleaver NP-C    10/25/2020, 7:26 AM [address] Office [phone] Fax [phone]  Notice: This dictation was prepared with Dragon dictation along with smaller phrase technology. Any transcriptional errors that result from this process are unintentional and may not be corrected upon review.  I spent***minutes examining this patient, reviewing medications, and using patient centered shared decision making involving her cardiac care.  Prior to her visit I spent greater than 20 minutes reviewing her past medical history,  medications, and prior cardiac tests.

## 2020-10-25 NOTE — Progress Notes (Addendum)
Tiffany Le       Tiffany Le,Tiffany Le [id number]             [phone]      16 Days Post-Op Procedure(s) (LRB): CORONARY ARTERY BYPASS GRAFTING (CABG), ON PUMP, TIMES FOUR, USING LEFT INTERNAL MAMMARY ARTERY AND LEFT RADIAL ARTERY (OPEN HARVEST) (N/A) RADIAL ARTERY HARVEST (Left) TRANSESOPHAGEAL ECHOCARDIOGRAM (TEE) (N/A) INDOCYANINE GREEN FLUORESCENCE IMAGING (ICG) (N/A) CLIPPING OF ATRIAL APPENDAGE USING ATRICURE 35MM CLIP (N/A)   Subjective:  Patient continues to feel better.  She does have a mild headache today.  She worked with PT yesterday without any dizziness.  She has been able to move her bowels.  Objective: Vital signs in last 24 hours: Temp:  [97.9 F (36.6 C)-98.9 F (37.2 C)] 98.9 F (37.2 C) (03/17 0514) Pulse Rate:  [78-88] 88 (03/17 0514) Cardiac Rhythm: Normal sinus rhythm (03/17 0700) Resp:  [12-17] 17 (03/17 0514) BP: (111-129)/(57-77) 116/58 (03/17 0514) SpO2:  [91 %-99 %] 92 % (03/17 0514) Weight:  [109.3 kg] 109.3 kg (03/17 0514)  Intake/Output from previous day: 03/16 0701 - 03/17 0700 In: -  Out: 250 [Urine:250]  General appearance: alert, cooperative and no distress Heart: regular rate and rhythm Lungs: clear to auscultation bilaterally Abdomen: soft, non-tender; bowel sounds normal; no masses,  no organomegaly Extremities: edema trace Wound: radial site clean and dry, pravena remains in place  Lab Results: Recent Labs    10/24/20 0409 10/25/20 0516  WBC 7.5 7.9  HGB 8.9* 8.6*  HCT 27.5* 26.3*  PLT 385 326   BMET:  Recent Labs    10/24/20 0409 10/25/20 0516  NA 135 137  K 3.9 4.1  CL 96* 97*  CO2 33* 32  GLUCOSE 54* 67*  BUN 21* 19  CREATININE 0.95 1.06*  CALCIUM 8.4* 8.5*    PT/INR: No results for input(s): LABPROT, INR in the last 72 hours. ABG    Component Value Date/Time   PHART 7.364 10/09/2020 2158   HCO3 23.9 10/09/2020 2158   TCO2 25 10/09/2020 2158   ACIDBASEDEF 1.0 10/09/2020 2158   O2SAT  63.8 10/25/2020 0516   CBG (last 3)  Recent Labs    10/24/20 1618 10/24/20 2102 10/25/20 0654  GLUCAP 86 126* 117*    Assessment/Plan: S/P Procedure(s) (LRB): CORONARY ARTERY BYPASS GRAFTING (CABG), ON PUMP, TIMES FOUR, USING LEFT INTERNAL MAMMARY ARTERY AND LEFT RADIAL ARTERY (OPEN HARVEST) (N/A) RADIAL ARTERY HARVEST (Left) TRANSESOPHAGEAL ECHOCARDIOGRAM (TEE) (N/A) INDOCYANINE GREEN FLUORESCENCE IMAGING (ICG) (N/A) CLIPPING OF ATRIAL APPENDAGE USING ATRICURE 35MM CLIP (N/A)  1. CV- NSR, BP stable- on Coreg, Bidil, Entresto 2. Pulm- no acute issues, off oxygen 3. Renal- creatinine WNL, weight is below admission, on Aldactone, Demadex, K is at 4.1 4. Neuro- Echocardiogram done yesterday, no evidence of thrombus found, continue DAPT therapy, recommending 30 day monitor to check for A. Fib, I have contacted Trish with Cardiology to help get his arranged 5. DM- hypoglycemia improved- Glipizide d/c'd per recommendation, insulin decreased 6. Deconditioning- continue PT/OT, home orders have been placed and are arranged 7. DIspo- patient stable, overall starting to feel better, no dizziness when working with PT/OT, Echo no evidence of thrombus, 30 day event monitor recommended and I have contacted Cardiology for this, if patient remains stable and continues to make progress, may be able to get home over the weekend   LOS: 23 days    Ellwood Handler, PA-C  10/25/2020 Agree with documentation; staples removed from left radial harvest  site; sternal vac taken down.  Anticipate d/c soon. Burkley Dech Z. Orvan Seen, [place]

## 2020-10-26 ENCOUNTER — Other Ambulatory Visit: Payer: Self-pay | Admitting: Physician Assistant

## 2020-10-26 ENCOUNTER — Telehealth (HOSPITAL_COMMUNITY): Payer: Self-pay | Admitting: Pharmacy Technician

## 2020-10-26 ENCOUNTER — Inpatient Hospital Stay (HOSPITAL_COMMUNITY): Payer: Medicaid Other

## 2020-10-26 ENCOUNTER — Ambulatory Visit: Payer: Self-pay | Admitting: General Practice

## 2020-10-26 ENCOUNTER — Telehealth: Payer: Self-pay | Admitting: Licensed Clinical Social Worker

## 2020-10-26 LAB — CBC
HCT: 27.3 % — ABNORMAL LOW (ref 36.0–46.0)
Hemoglobin: 8.7 g/dL — ABNORMAL LOW (ref 12.0–15.0)
MCH: 27 pg (ref 26.0–34.0)
MCHC: 31.9 g/dL (ref 30.0–36.0)
MCV: 84.8 fL (ref 80.0–100.0)
Platelets: 234 10*3/uL (ref 150–400)
RBC: 3.22 MIL/uL — ABNORMAL LOW (ref 3.87–5.11)
RDW: 13.9 % (ref 11.5–15.5)
WBC: 8.6 10*3/uL (ref 4.0–10.5)
nRBC: 0 % (ref 0.0–0.2)

## 2020-10-26 LAB — BASIC METABOLIC PANEL
Anion gap: 10 (ref 5–15)
BUN: 18 mg/dL (ref 6–20)
CO2: 30 mmol/L (ref 22–32)
Calcium: 8.7 mg/dL — ABNORMAL LOW (ref 8.9–10.3)
Chloride: 97 mmol/L — ABNORMAL LOW (ref 98–111)
Creatinine, Ser: 1.07 mg/dL — ABNORMAL HIGH (ref 0.44–1.00)
GFR, Estimated: >60 mL/min (ref >60)
Glucose, Bld: 158 mg/dL — ABNORMAL HIGH (ref 70–99)
Potassium: 3.7 mmol/L (ref 3.5–5.1)
Sodium: 137 mmol/L (ref 135–145)

## 2020-10-26 LAB — GLUCOSE, CAPILLARY
Glucose-Capillary: 143 mg/dL — ABNORMAL HIGH (ref 70–99)
Glucose-Capillary: 184 mg/dL — ABNORMAL HIGH (ref 70–99)
Glucose-Capillary: 210 mg/dL — ABNORMAL HIGH (ref 70–99)
Glucose-Capillary: 128 mg/dL — ABNORMAL HIGH (ref 70–99)
Glucose-Capillary: 139 mg/dL — ABNORMAL HIGH (ref 70–99)

## 2020-10-26 LAB — COOXEMETRY PANEL
Carboxyhemoglobin: 1.4 % (ref 0.5–1.5)
Methemoglobin: 0.9 % (ref 0.0–1.5)
O2 Saturation: 62.4 %
Total hemoglobin: 8.9 g/dL — ABNORMAL LOW (ref 12.0–16.0)

## 2020-10-26 MED ORDER — POTASSIUM CHLORIDE CRYS ER 20 MEQ PO TBCR
20.0000 meq | EXTENDED_RELEASE_TABLET | Freq: Once | ORAL | Status: AC
  Administered 2020-10-26: 20 meq via ORAL
  Filled 2020-10-26: qty 1

## 2020-10-26 MED ORDER — ACETAMINOPHEN 325 MG PO TABS: 650.0000 mg | ORAL_TABLET | Freq: Four times a day (QID) | ORAL | Status: DC | PRN

## 2020-10-26 MED ORDER — LANTUS SOLOSTAR 100 UNIT/ML ~~LOC~~ SOPN: 8.0000 [IU] | PEN_INJECTOR | Freq: Two times a day (BID) | SUBCUTANEOUS | Status: DC

## 2020-10-26 MED ORDER — ADULT MULTIVITAMIN W/MINERALS CH: 1.0000 | ORAL_TABLET | Freq: Every day | ORAL | Status: DC

## 2020-10-26 MED ORDER — TORSEMIDE 40 MG PO TABS: 40.0000 mg | ORAL_TABLET | Freq: Every day | ORAL | 3 refills | Status: DC

## 2020-10-26 NOTE — Progress Notes (Addendum)
Patient ID: Tiffany Le, female   DOB: March 20, 1976, 45 y.o.   MRN: [id number]     Advanced Heart Failure Rounding Note  PCP-Cardiologist: Quay Burow, MD   Subjective:    10/09/20 S/P CABG x4  Left Radial Harvest 3/8 s/p left thoracentesis w/ removal of 100 mL light red fluid. 3/14 Brain MRI shows acute to early sub acute ischemic infarcts, and a remote lacunar infarct involving the right thalamus  Co-ox 62%.  Wt down another 2 lb.   Still w/ slight HA. No dizziness.   Sitting up in chair. No cardiac complaints.    Echo post-op EF slightly better 40-45%. RV mildly reduced.  Repeat Echo Limited 3/16 EF improved 50-55%, no LA/LV thrombus   Objective:   Weight Range: 108 kg Body mass index is 40.87 kg/m.   Vital Signs:   Temp:  [98 F (36.7 C)-98.7 F (37.1 C)] 98.5 F (36.9 C) (03/18 0351) Pulse Rate:  [80-90] 86 (03/18 0752) Resp:  [14-20] 20 (03/18 0351) BP: (99-139)/(48-73) 136/72 (03/18 0752) SpO2:  [95 %-100 %] 100 % (03/18 0351) Weight:  [108 kg] 108 kg (03/18 0520) Last BM Date: 10/26/20  Weight change: Filed Weights   10/24/20 0342 10/25/20 0514 10/26/20 0520  Weight: 110.6 kg 109.3 kg 108 kg    Intake/Output:   Intake/Output Summary (Last 24 hours) at 10/26/2020 0901 Last data filed at 10/26/2020 0147 Gross per 24 hour  Intake 660 ml  Output 8 ml  Net 652 ml      Physical Exam    PHYSICAL EXAM: General:  Well appearing, sitting up in chair. No respiratory difficulty HEENT: normal Neck: supple. JVD 6-7 cm. Carotids 2+ bilat; no bruits. No lymphadenopathy or thyromegaly appreciated. Cor: PMI nondisplaced. Regular rate & rhythm. No rubs, gallops or murmurs. + sternal dressing  Lungs: clear Abdomen: soft, nontender, nondistended. No hepatosplenomegaly. No bruits or masses. Good bowel sounds. Extremities: no cyanosis, clubbing, rash, trace- 1+ bilateral LE edema Neuro: alert & oriented x 3, cranial nerves grossly intact. moves all 4 extremities  w/o difficulty. Affect pleasant.    Telemetry   NSR 80s no Afib.    Labs    CBC Recent Labs    10/25/20 0516 10/26/20 0600  WBC 7.9 8.6  HGB 8.6* 8.7*  HCT 26.3* 27.3*  MCV 83.8 84.8  PLT 326 573   Basic Metabolic Panel Recent Labs    10/25/20 0516 10/26/20 0600  NA 137 137  K 4.1 3.7  CL 97* 97*  CO2 32 30  GLUCOSE 67* 158*  BUN 19 18  CREATININE 1.06* 1.07*  CALCIUM 8.5* 8.7*   Liver Function Tests No results for input(s): AST, ALT, ALKPHOS, BILITOT, PROT, ALBUMIN in the last 72 hours. No results for input(s): LIPASE, AMYLASE in the last 72 hours. Cardiac Enzymes No results for input(s): CKTOTAL, CKMB, CKMBINDEX, TROPONINI in the last 72 hours.  BNP: BNP (last 3 results) Recent Labs    10/02/20 0516  BNP 947.1*    ProBNP (last 3 results) No results for input(s): PROBNP in the last 8760 hours.   D-Dimer No results for input(s): DDIMER in the last 72 hours. Hemoglobin A1C No results for input(s): HGBA1C in the last 72 hours. Fasting Lipid Panel No results for input(s): CHOL, HDL, LDLCALC, TRIG, CHOLHDL, LDLDIRECT in the last 72 hours. Thyroid Function Tests No results for input(s): TSH, T4TOTAL, T3FREE, THYROIDAB in the last 72 hours.  Invalid input(s): FREET3  Other results:   Imaging  DG Chest 2 View  Result Date: 10/26/2020 CLINICAL DATA:  Status post CABG EXAM: CHEST - 2 VIEW COMPARISON:  10/16/2020 FINDINGS: Cardiomegaly status post median sternotomy and CABG with left atrial appendage clip. No significant change in chest radiographs with a moderate to large left pleural effusion and small right pleural effusion with associated atelectasis or consolidation. No new airspace opacity. Right upper extremity PICC remains in position. IMPRESSION: 1. No significant change in chest radiographs with a moderate to large left pleural effusion and small right pleural effusion with associated atelectasis or consolidation. 2.  Cardiomegaly status post  median sternotomy and CABG. Electronically Signed   By: Eddie Candle M.D.   On: 10/26/2020 08:19     Medications:     Scheduled Medications: . aspirin  81 mg Oral Daily  . atorvastatin  80 mg Oral Daily  . bisacodyl  10 mg Oral Daily  . carvedilol  12.5 mg Oral BID WC  . Chlorhexidine Gluconate Cloth  6 each Topical Daily  . clopidogrel  75 mg Oral Daily  . colchicine  0.6 mg Oral BID  . dapagliflozin propanediol  10 mg Oral Daily  . docusate sodium  200 mg Oral Daily  . enoxaparin (LOVENOX) injection  40 mg Subcutaneous Q24H  . gabapentin  100 mg Oral TID  . insulin aspart  0-24 Units Subcutaneous TID WC  . insulin detemir  8 Units Subcutaneous BID  . isosorbide-hydrALAZINE  1 tablet Oral TID  . multivitamin with minerals  1 tablet Oral Daily  . pantoprazole  40 mg Oral Daily  . potassium chloride  20 mEq Oral Once  . sacubitril-valsartan  1 tablet Oral BID  . spironolactone  25 mg Oral Daily  . torsemide  40 mg Oral Daily    Infusions: . lactated ringers      PRN Medications: acetaminophen, dextrose, lactated ringers, lactulose, meclizine, metoprolol tartrate, ondansetron (ZOFRAN) IV, sodium chloride flush, sodium chloride flush, traMADol   Assessment/Plan   1. CAD: S/P CABG x4 on 10/09/20.  - ASA 81 mg + Plavix 75 mg  - Atorvastatin 80 mg  - No chest pain 2. Acute systolic CHF: Echo (4/97) with EF 35-40%, mild LVH (similar this admission and last).  Ischemic CM now s/p CABG. Repeat echo 3/3 LVEF improved 40-45%, RV mildly reduced. Repeat limited echo 3/16 EF 50-55%. Now off milrinone. Co-ox stable at 62% . Volume status stable  - Continue torsemide 40 daily (can titrate as needed) - Continue coreg at 12.5 mg twice a day.  - Continue Farxiga 10 mg will get 30 day free card  - Continue spironolactone 25 mg daily.  - Continue Bidil 1 tab tid --> $ 10.00 - Continue Entresto 49-51 mg twice a day. Plan increase to 97-103 bid soon  - Add TED hoses  3. ID:  Concern for  possible aspiration PNA pre-op.  CXR with bilateral infiltrates initially, now clearing with RUL infiltrate only (suspect she asymmetric edema or mucus plugging also). Afebrile. Cultures NGTD.  - On cefepime prior to surgery.  - Encouraged pulmonary toilet 4. Type 2 diabetes: Control improved recently. Hgb A1c 7.5 - Now on Farxiga.  - No change 5. VT: Noted in ER though strips not available.  -No VT noted. Off amio      6. AKI: Resolved.   7. Acute Gout -Uric acid 9.1.  - improved with colchicine 8. Deconditioning - continue to ambulate - PT recommending SNF but no insurance. Plan home w/ HH.  9.  CVA: Brain MRI completed given persistent dizziness, showing acute to early sub acute ischemic infarcts, and a remote lacunar infarct involving the right thalamus - Echo w/ no cardiac source  - Neuro recommending 30 day monitor to r/o PAF. If unremarkable, refer to EP for Loop recorder   Hopefully home tomorrow.   Advanced Heart Failure Team Pager [phone] (M-F; 7a - 5p)  Please contact [facility] for night-coverage after hours (4p -7a ) and weekends on [email or web address]  Lyda Jester PA-C  10/26/2020 9:01 AM   Patient seen and examined with the above-signed Advanced Practice Provider and/or Housestaff. I personally reviewed laboratory data, imaging studies and relevant notes. I independently examined the patient and formulated the important aspects of the plan. I have edited the note to reflect any of my changes or salient points. I have personally discussed the plan with the patient and/or family.  She is doing well. Volume status looks good. Walking halls. No CP.   General:  Well appearing. No resp difficulty HEENT: normal Neck: supple. no JVD. Carotids 2+ bilat; no bruits. No lymphadenopathy or thryomegaly appreciated. Cor: PMI nondisplaced. Wound vac ok Regular rate & rhythm. No rubs, gallops or murmurs. Lungs: clear Abdomen: obese soft, nontender, nondistended. No  hepatosplenomegaly. No bruits or masses. Good bowel sounds. Extremities: no cyanosis, clubbing, rash, edema Neuro: alert & orientedx3, cranial nerves grossly intact. moves all 4 extremities w/o difficulty. Affect pleasant   Doing well. Volume status looks good. Neuro symptoms improved. Hopefully home tomorrow on current meds. Meds d/w with Pharmd personally.  HF team will s/o. Please call as needed.   Glori Bickers, MD  6:21 PM

## 2020-10-26 NOTE — Progress Notes (Signed)
Occupational Therapy Treatment Patient Details Name: Tiffany Le MRN: [id number] DOB: 05-30-1976 Today's Date: 10/26/2020    History of present illness Pt is a 45 y.o. female recently admitted 2/14-2/17/22 with NSTEMI and LHC with severe CAD, recommended CABG vs. PCI, but left AMA, pt now readmitted 10/02/20 with respiratory distress; ETT 2/22-2/23. Pt with acute hypoxic respiratory failure likely secondary to acute cardiogenic pulmonary edema. S/p CABGx4 3/1. Pt underwent left thoracentesis 3/8. Pt with VAC placed 3/9.  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 PMH includes HTN, DM2, CAD, HF.   OT comments  Pt making steady progress towards OT goals this session. Pt received on BSC, pt able to state all sternal precautions and demonstrated good carryover of precautions functionally into ADLs. Pt with no further questions about AE, pt currently requires MOD A for pericare, with plans to use toileting tongs with wipes once DC home. Pt would continue to benefit from skilled occupational therapy while admitted and after d/c to address the below listed limitations in order to improve overall functional mobility and facilitate independence with BADL participation. DC plan remains appropriate, will follow acutely per POC.     Follow Up Recommendations  Home health OT    Equipment Recommendations  3 in 1 bedside commode;Tub/shower bench    Recommendations for Other Services      Precautions / Restrictions Precautions Precautions: Fall;Sternal;Other (comment) Precaution Booklet Issued: Yes (comment) Precaution Comments: able to state all precautions Restrictions Other Position/Activity Restrictions: sternal precautions       Mobility Bed Mobility               General bed mobility comments: pt recieved on BSC and returned to recliner to  eat lunch    Transfers Overall transfer level: Needs assistance Equipment used: Rolling walker (2 wheeled) Transfers: Sit to/from [facility] Sit to Stand: Supervision Stand pivot transfers: Min guard       General transfer comment: pt with good carryover of sit<>stand technique with hand crossed over chest when standing from BSC, min guard assist to pivot from BSC>recliner with RW    Balance Overall balance assessment: Needs assistance Sitting-balance support: No upper extremity supported;Feet supported       Standing balance support: No upper extremity supported;During functional activity   Standing balance comment: able to complete pericare in standing with no UE support                           ADL either performed or assessed with clinical judgement   ADL Overall ADL's : Needs assistance/impaired             Lower Body Bathing: Moderate assistance;Sit to/from stand Lower Body Bathing Details (indicate cue type and reason): simulated via pericare, pt able to reach anterior periarea via sit<>stand, total A for posterior pericare at this time however pt plans to use toilet tongs at home in order to maintain sternal precautions   Upper Body Dressing Details (indicate cue type and reason): education provided on compensatory methods for UB Dressing     Toilet Transfer: Min guard;RW;Stand-pivot;BSC Toilet Transfer Details (indicate cue type and reason): min guard assist for stand pivot from BSC>recliner with RW and min guard assist, good safety awareness in relation to maintaining sternal precautions during transfer Toileting- Clothing Manipulation and Hygiene: Moderate assistance;Sit to/from stand Toileting - Water quality scientist Details (  indicate cue type and reason): pt able to complete anterior pericare from sit<>stand but requries total A for posterior pericare in standing, pt plans to use toilet tongs at home with wipes with pt able to  verbalize technique     Functional mobility during ADLs: Min guard;Rolling walker General ADL Comments: follow up session with pt expressing no concers with AE education from previous session, pt able to state correct usage for each piece of AE. pt even reports already getting tub bench seat for bating at home and bought wipes to use with toileting aid.     Vision       Perception     Praxis      Cognition Arousal/Alertness: Awake/alert Behavior During Therapy: WFL for tasks assessed/performed Overall Cognitive Status: Within Functional Limits for tasks assessed                                          Exercises     Shoulder Instructions       General Comments reviewed all AE with pt expressing no concerns with AE, pt likely to DC home to mothers house tomorrow    Pertinent Vitals/ Pain       Pain Assessment: Faces Faces Pain Scale: No hurt  Home Living                                          Prior Functioning/Environment              Frequency  Min 2X/week        Progress Toward Goals  OT Goals(current goals can now be found in the care plan section)  Progress towards OT goals: Progressing toward goals  Acute Rehab OT Goals Patient Stated Goal: to go home tomorrow OT Goal Formulation: With patient Time For Goal Achievement: 11/07/20 Potential to Achieve Goals: Good  Plan Discharge plan remains appropriate;Frequency remains appropriate    Co-evaluation                 AM-PAC OT "6 Clicks" Daily Activity     Outcome Measure   Help from another person eating meals?: None Help from another person taking care of personal grooming?: None Help from another person toileting, which includes using toliet, bedpan, or urinal?: A Little Help from another person bathing (including washing, rinsing, drying)?: A Little Help from another person to put on and taking off regular upper body clothing?: A Little Help from  another person to put on and taking off regular lower body clothing?: A Little 6 Click Score: 20    End of Session Equipment Utilized During Treatment: Rolling walker  OT Visit Diagnosis: Unsteadiness on feet (R26.81);Muscle weakness (generalized) (M62.81);Dizziness and giddiness (R42);Pain;Other abnormalities of gait and mobility (R26.89)   Activity Tolerance Patient tolerated treatment well   Patient Left in chair;with call bell/phone within reach   Nurse Communication Mobility status        Time: 1448-1856 OT Time Calculation (min): 12 min  Charges: OT General Charges $OT Visit: 1 Visit OT Treatments $Self Care/Home Management : 8-22 mins  Harley Alto., COTA/L Acute Rehabilitation Services [place] 10/26/2020, 1:44 PM

## 2020-10-26 NOTE — Progress Notes (Signed)
CARDIAC REHAB PHASE I   PRE:  Rate/Rhythm: 86 SR    BP: sitting 152/77    SaO2: 95 RA  MODE:  Ambulation: 430 ft   POST:  Rate/Rhythm: 92 SR    BP: sitting 160/99     SaO2: 96 RA   Pt needed mod assist x2 to roll to EOB. Stood independently, to [facility]. Ambulated with RW, standby assist x2. No c/o, tolerated very well. To EOB. BP elevated in leg. Discussed ed with pt and family. Discussed IS (500 mL), sternal precautions, exercise, diet (DM, HH, low sodium), daily wts, and CRPII. Pt voiced understanding, will refer to [place], ACSM 10/26/2020 3:18 PM

## 2020-10-26 NOTE — Plan of Care (Signed)
  Problem: Education: Goal: Knowledge of General Education information will improve Description: Including pain rating scale, medication(s)/side effects and non-pharmacologic comfort measures Outcome: Progressing   Problem: Health Behavior/Discharge Planning: Goal: Ability to manage health-related needs will improve Outcome: Progressing   Problem: Clinical Measurements: Goal: Ability to maintain clinical measurements within normal limits will improve Outcome: Progressing Goal: Will remain free from infection Outcome: Progressing Goal: Diagnostic test results will improve Outcome: Progressing Goal: Respiratory complications will improve Outcome: Progressing Goal: Cardiovascular complication will be avoided Outcome: Progressing   Problem: Activity: Goal: Risk for activity intolerance will decrease Outcome: Progressing   Problem: Coping: Goal: Level of anxiety will decrease Outcome: Progressing   Problem: Elimination: Goal: Will not experience complications related to bowel motility Outcome: Progressing Goal: Will not experience complications related to urinary retention Outcome: Progressing   Problem: Education: Goal: Ability to demonstrate management of disease process will improve Outcome: Progressing Goal: Ability to verbalize understanding of medication therapies will improve Outcome: Progressing Goal: Individualized Educational Video(s) Outcome: Progressing   Problem: Activity: Goal: Capacity to carry out activities will improve Outcome: Progressing   Problem: Cardiac: Goal: Ability to achieve and maintain adequate cardiopulmonary perfusion will improve Outcome: Progressing   Problem: Education: Goal: Will demonstrate proper wound care and an understanding of methods to prevent future damage Outcome: Progressing Goal: Knowledge of disease or condition will improve Outcome: Progressing Goal: Knowledge of the prescribed therapeutic regimen will  improve Outcome: Progressing Goal: Individualized Educational Video(s) Outcome: Progressing   Problem: Activity: Goal: Risk for activity intolerance will decrease Outcome: Progressing   Problem: Cardiac: Goal: Will achieve and/or maintain hemodynamic stability Outcome: Progressing   Problem: Clinical Measurements: Goal: Postoperative complications will be avoided or minimized Outcome: Progressing   Problem: Respiratory: Goal: Respiratory status will improve Outcome: Progressing   Problem: Skin Integrity: Goal: Wound healing without signs and symptoms of infection Outcome: Progressing Goal: Risk for impaired skin integrity will decrease Outcome: Progressing   Problem: Urinary Elimination: Goal: Ability to achieve and maintain adequate renal perfusion and functioning will improve Outcome: Progressing   Problem: Education: Goal: Knowledge of disease or condition will improve Outcome: Progressing Goal: Knowledge of secondary prevention will improve Outcome: Progressing Goal: Knowledge of patient specific risk factors addressed and post discharge goals established will improve Outcome: Progressing Goal: Individualized Educational Video(s) Outcome: Progressing

## 2020-10-26 NOTE — Telephone Encounter (Signed)
Sent in [employer] and [employer] applications via fax.  Will follow up.

## 2020-10-26 NOTE — Progress Notes (Signed)
      [address]       [place] [address]             [phone]      17 Days Post-Op Procedure(s) (LRB): CORONARY ARTERY BYPASS GRAFTING (CABG), ON PUMP, TIMES FOUR, USING LEFT INTERNAL MAMMARY ARTERY AND LEFT RADIAL ARTERY (OPEN HARVEST) (N/A) RADIAL ARTERY HARVEST (Left) TRANSESOPHAGEAL ECHOCARDIOGRAM (TEE) (N/A) INDOCYANINE GREEN FLUORESCENCE IMAGING (ICG) (N/A) CLIPPING OF ATRIAL APPENDAGE USING ATRICURE 35MM CLIP (N/A)   Subjective:  Continues to feel better.  Had some neck pain overnight, didn't sleep much  Objective: Vital signs in last 24 hours: Temp:  [98 F (36.7 C)-98.8 F (37.1 C)] 98.5 F (36.9 C) (03/18 0351) Pulse Rate:  [80-90] 90 (03/18 0351) Cardiac Rhythm: Normal sinus rhythm (03/18 0727) Resp:  [14-20] 20 (03/18 0351) BP: (99-139)/(48-73) 139/73 (03/18 0351) SpO2:  [95 %-100 %] 100 % (03/18 0351) Weight:  [287 kg] 108 kg (03/18 0520)  Intake/Output from previous day: 03/17 0701 - 03/18 0700 In: 900 [P.O.:900] Out: 8 [Urine:4; Stool:4]  General appearance: alert, cooperative and no distress Heart: regular rate and rhythm Lungs: clear to auscultation bilaterally Abdomen: soft, non-tender; bowel sounds normal; no masses,  no organomegaly Extremities: edema trace Wound: clean and dry  Lab Results: Recent Labs    10/25/20 0516 10/26/20 0600  WBC 7.9 8.6  HGB 8.6* 8.7*  HCT 26.3* 27.3*  PLT 326 234   BMET:  Recent Labs    10/25/20 0516 10/26/20 0600  NA 137 137  K 4.1 3.7  CL 97* 97*  CO2 32 30  GLUCOSE 67* 158*  BUN 19 18  CREATININE 1.06* 1.07*  CALCIUM 8.5* 8.7*    PT/INR: No results for input(s): LABPROT, INR in the last 72 hours. ABG    Component Value Date/Time   PHART 7.364 10/09/2020 2158   HCO3 23.9 10/09/2020 2158   TCO2 25 10/09/2020 2158   ACIDBASEDEF 1.0 10/09/2020 2158   O2SAT 62.4 10/26/2020 0600   CBG (last 3)  Recent Labs    10/25/20 1551 10/25/20 2114 10/26/20 0629  GLUCAP 207*  179* 143*    Assessment/Plan: S/P Procedure(s) (LRB): CORONARY ARTERY BYPASS GRAFTING (CABG), ON PUMP, TIMES FOUR, USING LEFT INTERNAL MAMMARY ARTERY AND LEFT RADIAL ARTERY (OPEN HARVEST) (N/A) RADIAL ARTERY HARVEST (Left) TRANSESOPHAGEAL ECHOCARDIOGRAM (TEE) (N/A) INDOCYANINE GREEN FLUORESCENCE IMAGING (ICG) (N/A) CLIPPING OF ATRIAL APPENDAGE USING ATRICURE 35MM CLIP (N/A)  1. CV- NSR, BP stable- continue Entresto, Coreg, Bidil 2. Pulm- no acute issues, continue IS 3. Renal- creatinine WNL, weight continues to improve- on Demadex and Spironolactone 4. DM- hypoglycemia resolved, continue current insulin/medication regimen 5. dispo- patient stable, making progress now that she is feeling better, will have her work with PT/OT again today, ambulate around the unit if she remains clinically stable will plan to d/c home tomorrow if she continues to do well.   LOS: 24 days    Ellwood Handler, PA-C 10/26/2020

## 2020-10-26 NOTE — Telephone Encounter (Signed)
Pt was to have an appointment w/ Denyse Amass today at [facility].  Aware pt still in the hospital, contacted [facility] in scheduling and she has cancelled today's appointment and pt will be rescheduled when discharged.  Westley Hummer, MSW, [place]  [phone]

## 2020-10-27 LAB — GLUCOSE, CAPILLARY
Glucose-Capillary: 151 mg/dL — ABNORMAL HIGH (ref 70–99)
Glucose-Capillary: 164 mg/dL — ABNORMAL HIGH (ref 70–99)

## 2020-10-27 LAB — CBC
HCT: 25.8 % — ABNORMAL LOW (ref 36.0–46.0)
Hemoglobin: 8.4 g/dL — ABNORMAL LOW (ref 12.0–15.0)
MCH: 27.2 pg (ref 26.0–34.0)
MCHC: 32.6 g/dL (ref 30.0–36.0)
MCV: 83.5 fL (ref 80.0–100.0)
Platelets: 265 10*3/uL (ref 150–400)
RBC: 3.09 MIL/uL — ABNORMAL LOW (ref 3.87–5.11)
RDW: 14.3 % (ref 11.5–15.5)
WBC: 8 10*3/uL (ref 4.0–10.5)
nRBC: 0 % (ref 0.0–0.2)

## 2020-10-27 LAB — BASIC METABOLIC PANEL
Anion gap: 10 (ref 5–15)
BUN: 12 mg/dL (ref 6–20)
CO2: 31 mmol/L (ref 22–32)
Calcium: 8.6 mg/dL — ABNORMAL LOW (ref 8.9–10.3)
Chloride: 97 mmol/L — ABNORMAL LOW (ref 98–111)
Creatinine, Ser: 1.05 mg/dL — ABNORMAL HIGH (ref 0.44–1.00)
GFR, Estimated: >60 mL/min (ref >60)
Glucose, Bld: 162 mg/dL — ABNORMAL HIGH (ref 70–99)
Potassium: 3.8 mmol/L (ref 3.5–5.1)
Sodium: 138 mmol/L (ref 135–145)

## 2020-10-27 LAB — COOXEMETRY PANEL
Carboxyhemoglobin: 1.4 % (ref 0.5–1.5)
Methemoglobin: 0.9 % (ref 0.0–1.5)
O2 Saturation: 59.6 %
Total hemoglobin: 8.7 g/dL — ABNORMAL LOW (ref 12.0–16.0)

## 2020-10-27 NOTE — TOC Transition Note (Signed)
Transition of Care [facility]) - CM/SW Discharge Note   Patient Details  Name: Tiffany Le MRN: [id number] Date of Birth: Feb 09, 1976  Transition of [facility]) CM/SW Contact:  Ella Bodo, RN Phone Number: 10/27/2020, 11:18 AM   Clinical Narrative: Patient medically stable for discharge home today.  She has recommended DME at bedside to take home with her.  Notified [place] of discharge home today.  Patient is uninsured, but is eligible for medication assistance through [place] letter given to patient to take to retail pharmacy.  She is appreciative of assistance.    Final next level of care: Home w Home Health Services Barriers to Discharge: Barriers Resolved   Patient Goals and CMS Choice Patient states their goals for this hospitalization and ongoing recovery are:: to be able to get home   Choice offered to / list presented to :  (charity choice for the week was [place]- no insurance)                      Discharge Plan and Services In-house Referral: NA Discharge Planning Services: Medication Assistance,MATCH Program Post Acute Care Choice: [place]          DME Arranged: [place]ford Rile rolling,Bedside commode DME Agency: [employer] Date DME Agency Contacted: 10/18/20 Time DME Agency Contacted: 0712 Representative spoke with at DME Agency: [place]: PT,OT [place] Agency: [place] ([place]) Date [place]: 10/10/20 Time [place]: 1400 Representative spoke with at [place]: [place] ([place]) Interventions Financial Strain Interventions: Other (Comment) (pt. reports she would like to file for disability, and has no health insurance, and she has to pay out of pocket for all her meds, and doesn't have food stamps and has to walk up steps to her apartment with no elevator) Housing Interventions: Intervention Not Indicated Stress  Interventions: Intervention Not Indicated Transportation Interventions: [facility]   Readmission Risk Interventions Readmission Risk Prevention Plan 10/11/2020  Transportation Screening Complete  HRI or [place] Complete  Social Work Consult for [place] Planning/Counseling Complete  Palliative Care Screening Not Applicable  Medication Review Press photographer) Complete  Some recent data might be hidden   Reinaldo Raddle, RN, BSN  Trauma/Neuro ICU Case Manager [phone]

## 2020-10-27 NOTE — Progress Notes (Signed)
IV team nurse removed PICC line. Patient had medications from pharmacy as well as match letter. Patient received discharge instructions and understood it well. Pt's mom took her all belongings and walker & BSC. HS [employer]

## 2020-10-27 NOTE — Progress Notes (Addendum)
[address]       [place] [address]             [phone]        18 Days Post-Op Procedure(s) (LRB): CORONARY ARTERY BYPASS GRAFTING (CABG), ON PUMP, TIMES FOUR, USING LEFT INTERNAL MAMMARY ARTERY AND LEFT RADIAL ARTERY (OPEN HARVEST) (N/A) RADIAL ARTERY HARVEST (Left) TRANSESOPHAGEAL ECHOCARDIOGRAM (TEE) (N/A) INDOCYANINE GREEN FLUORESCENCE IMAGING (ICG) (N/A) CLIPPING OF ATRIAL APPENDAGE USING ATRICURE 35MM CLIP (N/A)  Subjective: Patient states barely has a headache and thinks for a "crink in her neck". She denies dizziness and is looking forward to going home.  Objective: Vital signs in last 24 hours: Temp:  [98.3 F (36.8 C)-98.9 F (37.2 C)] 98.9 F (37.2 C) (03/19 0717) Pulse Rate:  [85-93] 93 (03/19 0351) Cardiac Rhythm: Normal sinus rhythm (03/19 0800) Resp:  [14-20] 15 (03/19 0717) BP: (106-163)/(57-87) 138/64 (03/19 0717) SpO2:  [95 %-99 %] 96 % (03/19 0717) Weight:  [106.2 kg] 106.2 kg (03/19 0558)  Pre op weight 111.9 kg Current Weight  10/27/20 106.2 kg       Intake/Output from previous day: 03/18 0701 - 03/19 0700 In: 240 [P.O.:240] Out: 1025 [Urine:1025]   Physical Exam:  Cardiovascular: RRR Pulmonary: Slightly diminished bibasilar breath sounds Abdomen: Soft, non tender, bowel sounds present. Extremities: Mild LE edema. Motor/sensory intact LUE Wounds: Sternal wound is clean and dry. LUE wound has steri strips in place  Lab Results: CBC: Recent Labs    10/26/20 0600 10/27/20 0500  WBC 8.6 8.0  HGB 8.7* 8.4*  HCT 27.3* 25.8*  PLT 234 265   BMET:  Recent Labs    10/26/20 0600 10/27/20 0500  NA 137 138  K 3.7 3.8  CL 97* 97*  CO2 30 31  GLUCOSE 158* 162*  BUN 18 12  CREATININE 1.07* 1.05*  CALCIUM 8.7* 8.6*    PT/INR:  Lab Results  Component Value Date   INR 1.4 (H) 10/09/2020   INR 1.1 10/02/2020   INR 1.0 12/02/2019   ABG:  INR: Will add last result for INR, ABG once components are  confirmed Will add last 4 CBG results once components are confirmed  Assessment/Plan:  1. CV - S/p NSTEMI. SR. On Coreg 12.5 mg bid, Plavix 75 mg daily, Bidil bid 20/37.5 mg bid. Co ox this am up to 59.6 ;has been off Milrinone. 2.  Pulmonary - On room air. Encourage incentive spirometer. I discussed with Dr. Orvan Seen last CXR showed moderate left pleural effusion. Patient had previous left thoracentesis 03/08 but only 100 cc removed. Per Dr. Orvan Seen, we will follow after discharge and she does not need repeat left thoracentesis at this time. 3. Acute systolic CHF-on Torsemide 40 mg daily,  Spironolactone 25 mg daily,  and Entresto bid. 4.  Expected post op acute blood loss anemia - H and H this am slightly decreased to 8.4 and 25.8 5. DM-CBGs 128/139/151. On Glipizide 10 mg bid, Farxiga 10 mg daily, and Insulin. Pre op HGA1C 7.8. Will need close medical follow up after discharge 6. AKI-creatinine stable at 1.27 7. Deconditioned-has improved a lot this week with cardiac rehab, PT and OT 8. SNF likely not an option as patient does not have insurance. Will likely go [facility] with [facility] but will check with social work 9. Dizzines has resolved and barely has a headache. She thinks her neck has "crink in it, which was causing headache".  MRI done 03/14 showed early sub acute  ischemic infarcts, and a remote lacunar infarct involving the right thalamus. Echo w/ no cardiac source. Neuro recommending 30 day monitor to r/o PAF. If unremarkable, refer to EP for Loop recorder 10. Remove PICC line 11. Likely discharge  Tiffany Le Tiffany ZimmermanPA-C 10/27/2020,9:47 AM

## 2020-10-27 NOTE — Progress Notes (Signed)
CARDIAC REHAB PHASE I  0840 CR RN in to ambulate with patient. Patient observed sitting up in chair using IS. Ambulation offered. Patient declined stating she is going home today and prefers to wait to ambulate at home secondary to being on her menstrual cycle. Patient denied need for education reinforcement. Encouraged to follow guidelines for activity progression upon discharge. Patient verbalized understanding. CR will sign off at this time.  Baer Hinton Minus Breeding RN, BSN

## 2020-10-29 ENCOUNTER — Telehealth (HOSPITAL_COMMUNITY): Payer: Self-pay

## 2020-10-29 ENCOUNTER — Ambulatory Visit: Payer: Self-pay | Admitting: Cardiothoracic Surgery

## 2020-10-29 ENCOUNTER — Telehealth: Payer: Self-pay | Admitting: Licensed Clinical Social Worker

## 2020-10-29 NOTE — Telephone Encounter (Signed)
Called and spoke with pt in regards to CR, pt stated she is still waiting on her Medicaid. But she is interested in our VCR program. Explained scheduling process, patient verbalized understanding. Will contact patient for scheduling once f/u has been completed.

## 2020-10-29 NOTE — Telephone Encounter (Signed)
Previously contacted by [facility] team, pt requesting application for DMV disability placard. Pt care plan is for her to return to [facility] clinic and not [facility] office at this time. Her appt with [facility] is on the 23rd and I have reached out and sent placard application to Claudie Revering at [facility]. If pt does not make that appointment I will f/u with [facility] social worker [place].   Westley Hummer, MSW, [place]  [phone]

## 2020-10-30 ENCOUNTER — Telehealth: Payer: Self-pay | Admitting: Family Medicine

## 2020-10-30 NOTE — Telephone Encounter (Signed)
Erlene Quan from [facility] called to request Occup Therapy orders for this patient. OT once per week for 3 weeks.  Erlene Quan: [phone]

## 2020-10-30 NOTE — Telephone Encounter (Signed)
Sent in [employer] application via fax.  Will follow up.

## 2020-10-31 ENCOUNTER — Ambulatory Visit (INDEPENDENT_AMBULATORY_CARE_PROVIDER_SITE_OTHER): Payer: Medicaid Other | Admitting: Family Medicine

## 2020-10-31 ENCOUNTER — Other Ambulatory Visit: Payer: Self-pay

## 2020-10-31 ENCOUNTER — Encounter: Payer: Self-pay | Admitting: Family Medicine

## 2020-10-31 VITALS — BP 114/65 | HR 86 | Ht 64.0 in | Wt 234.0 lb

## 2020-10-31 DIAGNOSIS — I2699 Other pulmonary embolism without acute cor pulmonale: Secondary | ICD-10-CM | POA: Diagnosis not present

## 2020-10-31 DIAGNOSIS — I639 Cerebral infarction, unspecified: Secondary | ICD-10-CM | POA: Diagnosis not present

## 2020-10-31 DIAGNOSIS — I214 Non-ST elevation (NSTEMI) myocardial infarction: Secondary | ICD-10-CM

## 2020-10-31 DIAGNOSIS — M109 Gout, unspecified: Secondary | ICD-10-CM

## 2020-10-31 DIAGNOSIS — Z09 Encounter for follow-up examination after completed treatment for conditions other than malignant neoplasm: Secondary | ICD-10-CM

## 2020-10-31 DIAGNOSIS — R0602 Shortness of breath: Secondary | ICD-10-CM

## 2020-10-31 DIAGNOSIS — Z951 Presence of aortocoronary bypass graft: Secondary | ICD-10-CM | POA: Diagnosis not present

## 2020-10-31 NOTE — Patient Instructions (Addendum)
Please contact your Opthalmaologist and schedule a Diabetic Eye Exam.  Uric Acid Nephropathy  Uric acid is a chemical compound that is made when your body digests some kinds of food and also when your body breaks down dead cells. It is a waste product that is normally removed from your body by your kidneys. If you have too much uric acid in your blood, it can build up in your kidneys and cause damage (nephropathy). There are two types of uric acid nephropathy:  Sudden (acute) uric acid nephropathy results from a sudden buildup of uric acid.  Long-term (chronic) uric acid nephropathy results from a slow buildup of uric acid over a long period of time. What are the causes? The exact cause of this condition may depend on the type of uric acid nephropathy:  Acute uric acid nephropathy may be caused by: ? Receiving medicines for the treatment of cancer (chemotherapy). Use of these medicines causes rapid breakdown of cells. The cell breakdown produces excess uric acid. As uric acid builds up in your kidneys, it causes an increase of pressure and a loss of blood supply. This makes your kidneys less able to filter blood and make urine. ? A tumor (cancer) in the body. ? Seizures. ? Taking medicines that can cause excess uric acid. Examples are aspirin, water pills (diuretics), and medicines that are prescribed after an organ transplant. ? Severe diarrhea, which causes fluid loss (dehydration).  Chronic uric acid nephropathy may happen if you have high levels of uric acid in your body on a regular basis. One reason you may have high levels of uric acid is gout. With gout, excess uric acid forms into crystals. These crystals can get stuck inside joints and cause painful swelling. They may also build up in your kidneys and cause long-term damage. What increases the risk? You may be more likely to develop this condition if you:  Are female.  Are 66 years old or older.  Have gout.  Eat a lot of foods  that are high in certain natural chemical compounds (purines). Shellfish and red meat contain a lot of purines.  Drink alcohol.  Have recently had heart surgery. What are the signs or symptoms? Signs and symptoms depend on the type of nephropathy that you have. They may include:  Decreased urine output.  Nausea and vomiting.  Lack of energy.  Seizures.  Blood-tinged urine.  Pain when passing urine.  Pain in the sides of the lower back (flank pain). In some cases, there are no symptoms. How is this diagnosed? Your health care provider may suspect uric acid nephropathy from your signs and symptoms, especially if you have gout. He or she may:  Do a physical exam.  Order tests to confirm the diagnosis. Tests may include: ? Blood and urine tests. This is the best way to measure high levels of uric acid. ? Imaging studies to check for kidney stones or kidney damage. These may include:  X-rays.  Ultrasound.  CT scan.  MRI. How is this treated? The goal of treatment is to lower the level of uric acid in your body and prevent kidney damage. This can be done by:  Taking medicines that block the production of uric acid. The most commonly used medicine is allopurinol. If you are starting chemotherapy, ask your health care provider if you should start taking a medicine to prevent high uric acid.  Starting a diet plan that lowers your intake of purines. Work with a diet and nutrition specialist (  dietitian) to limit your intake of foods and drinks that increase uric acid.  Preventing uric acid buildup. Drink plenty of water to maintain a good flow of urine and to lower the acidity of your urine. You may also need to take a medicine called bicarbonate.  Resting the kidneys. This can be done by using a machine to clean your blood (hemodialysis), if necessary. In hemodialysis, your blood is removed, passed through a filtering machine, and then returned to your body. Several sessions of  hemodialysis usually improve kidney function by removing uric acid. Follow these instructions at home: Eating and drinking  Drink enough fluid to keep your urine pale yellow.  Do not drink alcohol.  Do not drink beverages that contain a type of sugar called fructose.  Limit how much red meat and shellfish you eat.  Include plenty of low-fat dairy foods in your diet.      General instructions  Take over-the-counter and prescription medicines only as told by your health care provider.  Maintain a healthy weight. Lose weight as directed by your health care provider.  Keep all follow-up visits as told by your health care provider. This is important. Contact a health care provider if you:  Feel tired and have low energy, even when you get enough sleep.  Have pain when passing urine.  Have nausea or vomiting. Get help right away if you:  Produce very little urine, even when you drink enough fluids.  Have blood in your urine.  Have a seizure. Summary  Uric acid is a waste product that is normally removed from your body by your kidneys. If you have too much uric acid in your blood, it can build up in your kidneys and cause damage (nephropathy).  Sudden (acute) uric acid nephropathy results from a sudden buildup of uric acid. Long-term (chronic) uric acid nephropathy results from a slow buildup of uric acid over a long period of time. This may happen if you have gout.  The goal of treatment is to lower the level of uric acid in your body and prevent kidney damage. This information is not intended to replace advice given to you by your health care provider. Make sure you discuss any questions you have with your health care provider. Document Revised: 11/16/2018 Document Reviewed: 08/18/2017 Elsevier Patient Education  2021 [employer].   Gout  Gout is painful swelling of your joints. Gout is a type of arthritis. It is caused by having too much uric acid in your body. Uric  acid is a chemical that is made when your body breaks down substances called purines. If your body has too much uric acid, sharp crystals can form and build up in your joints. This causes pain and swelling. Gout attacks can happen quickly and be very painful (acute gout). Over time, the attacks can affect more joints and happen more often (chronic gout). What are the causes?  Too much uric acid in your blood. This can happen because: ? Your kidneys do not remove enough uric acid from your blood. ? Your body makes too much uric acid. ? You eat too many foods that are high in purines. These foods include organ meats, some seafood, and beer.  Trauma or stress. What increases the risk?  Having a family history of gout.  Being female and middle-aged.  Being female and having gone through menopause.  Being very overweight (obese).  Drinking alcohol, especially beer.  Not having enough water in the body (being dehydrated).  Losing weight too quickly.  Having an organ transplant.  Having lead poisoning.  Taking certain medicines.  Having kidney disease.  Having a skin condition called psoriasis. What are the signs or symptoms? An attack of acute gout usually happens in just one joint. The most common place is the big toe. Attacks often start at night. Other joints that may be affected include joints of the feet, ankle, knee, fingers, wrist, or elbow. Symptoms of an attack may include:  Very bad pain.  Warmth.  Swelling.  Stiffness.  Shiny, red, or purple skin.  Tenderness. The affected joint may be very painful to touch.  Chills and fever. Chronic gout may cause symptoms more often. More joints may be involved. You may also have white or yellow lumps (tophi) on your hands or feet or in other areas near your joints.   How is this treated?  Treatment for this condition has two phases: treating an acute attack and preventing future attacks.  Acute gout treatment may  include: ? NSAIDs. ? Steroids. These are taken by mouth or injected into a joint. ? Colchicine. This medicine relieves pain and swelling. It can be given by mouth or through an IV tube.  Preventive treatment may include: ? Taking small doses of NSAIDs or colchicine daily. ? Using a medicine that reduces uric acid levels in your blood. ? Making changes to your diet. You may need to see a food expert (dietitian) about what to eat and drink to prevent gout. Follow these instructions at home: During a gout attack  If told, put ice on the painful area: ? Put ice in a plastic bag. ? Place a towel between your skin and the bag. ? Leave the ice on for 20 minutes, 2-3 times a day.  Raise (elevate) the painful joint above the level of your heart as often as you can.  Rest the joint as much as possible. If the joint is in your leg, you may be given crutches.  Follow instructions from your doctor about what you cannot eat or drink.   Avoiding future gout attacks  Eat a low-purine diet. Avoid foods and drinks such as: ? Liver. ? Kidney. ? Anchovies. ? Asparagus. ? Herring. ? Mushrooms. ? Mussels. ? Beer.  Stay at a healthy weight. If you want to lose weight, talk with your doctor. Do not lose weight too fast.  Start or continue an exercise plan as told by your doctor. Eating and drinking  Drink enough fluids to keep your pee (urine) pale yellow.  If you drink alcohol: ? Limit how much you use to:  0-1 drink a day for women.  0-2 drinks a day for men. ? Be aware of how much alcohol is in your drink. In the [place] one drink equals one 12 oz bottle of beer (355 mL), one 5 oz glass of wine (148 mL), or one 1 oz glass of hard liquor (44 mL). General instructions  Take over-the-counter and prescription medicines only as told by your doctor.  Do not drive or use heavy machinery while taking prescription pain medicine.  Return to your normal activities as told by your doctor. Ask  your doctor what activities are safe for you.  Keep all follow-up visits as told by your doctor. This is important. Contact a doctor if:  You have another gout attack.  You still have symptoms of a gout attack after 10 days of treatment.  You have problems (side effects) because of your medicines.  You have chills or a fever.  You have burning pain when you pee (urinate).  You have pain in your lower back or belly. Get help right away if:  You have very bad pain.  Your pain cannot be controlled.  You cannot pee. Summary  Gout is painful swelling of the joints.  The most common site of pain is the big toe, but it can affect other joints.  Medicines and avoiding some foods can help to prevent and treat gout attacks. This information is not intended to replace advice given to you by your health care provider. Make sure you discuss any questions you have with your health care provider. Document Revised: 02/17/2018 Document Reviewed: 02/17/2018 Elsevier Patient Education  [place].

## 2020-10-31 NOTE — Progress Notes (Signed)
Patient [facility] Follow Up  Subjective:  Patient ID: Tiffany Le, female    DOB: 1975-12-13  Age: 45 y.o. MRN: [id number]  CC:  Chief Complaint  Patient presents with  . Hospitalization Follow-up    HPI Tiffany Le is a 45 year old female who presents for Hospital Follow Up today.   Patient Active Problem List   Diagnosis Date Noted  . 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 ([place])   . 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  . Pulmonary edema 10/02/2020  . Acute respiratory failure with hypoxia ([place])   . Coronary artery disease involving native coronary artery of native heart with unstable angina pectoris ([place])   . Acute on chronic combined systolic (congestive) and diastolic (congestive) heart failure ([place])   . NSTEMI (non-ST elevated myocardial infarction) ([place]) 09/25/2020  . Uncontrolled diabetes mellitus with complication, without long-term current use of insulin ([place])   . Shortness of breath 12/25/2019  . Hemoglobin A1C greater than 9%, indicating poor diabetic control 12/25/2019  . Sepsis due to urinary tract infection ([place]) 12/03/2019  . Sepsis secondary to UTI ([place]) 12/02/2019  . Class 3 severe obesity due to excess calories with serious comorbidity and body mass index (BMI) of 40.0 to 44.9 in adult ([place]) 10/24/2018  . Back pain 10/24/2018  . Elevated LFTs 01/13/2014  . Unspecified vitamin D deficiency 01/13/2014  . Anemia, iron deficiency 11/17/2012  . Essential hypertension, benign 11/17/2012  . Pure hypercholesterolemia 11/17/2012   Current Status: Since her last office visit, she has had a Hospital Admission for CVA, CABG, and Pulmonary Embolism from 10/02/2020-10/27/2020 and previously from 09/24/2020-09/27/2020 for NSTEMI. Today, she  is doing well with no complaints. She reports shortness of breath when prone. She denies visual changes, chest pain, cough, heart palpitations, and  falls. She has occasional headaches and dizziness with position changes. Denies severe headaches, confusion, seizures, double vision, and blurred vision, nausea and vomiting. She has not been monitoring her blood glucose levels regularly. She denies fatigue, frequent urination, blurred vision, excessive hunger, excessive thirst, weight gain, weight loss, and poor wound healing. She continues to check her feet regularly.    She denies fevers, chills, fatigue, recent infections, weight loss, and night sweats. Denies GI problems such as diarrhea, and constipation. She has no reports of blood in stools, dysuria and hematuria. No depression or anxiety reported today. She is taking all medications as prescribed. She denies pain today.   Past Medical History:  Diagnosis Date  . Coronary artery disease   . CVA (cerebral vascular accident) ([place]) 09/2020  . Diabetes mellitus without complication ([place])   . Diabetic neuropathy ([place]) 02/2020  . 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  . Hyperlipidemia age 73  . Hypertension age 72  . Hypocalcemia 11/2019  . Hypokalemia 11/2019  . Iron deficiency anemia   . NSTEMI (non-ST elevated myocardial infarction) ([place]) 09/2020  . Obesity   . Proteinuria 12/09/2019  . Pulmonary embolism ([place]) 09/2020    Past Surgical History:  Procedure Laterality Date  . CARDIAC CATHETERIZATION  09/25/2020  . CESAREAN SECTION  3/96  . CLIPPING OF ATRIAL APPENDAGE N/A 10/09/2020   Procedure: CLIPPING OF ATRIAL APPENDAGE USING ATRICURE 35MM CLIP;  Surgeon: [name], MD;  Location: [employer];  Service: Open Heart Surgery;  Laterality: N/A;  . CORONARY ARTERY BYPASS GRAFT N/A 10/09/2020   Procedure: CORONARY ARTERY BYPASS GRAFTING (CABG), ON PUMP, TIMES FOUR,  USING LEFT INTERNAL MAMMARY ARTERY AND LEFT RADIAL ARTERY (OPEN HARVEST);  Surgeon: [name], MD;  Location: [facility];  Service: Open Heart Surgery;  Laterality: N/A;  POSSIBLE BIMA  . IR THORACENTESIS ASP PLEURAL SPACE W/IMG GUIDE   10/16/2020  . LEFT HEART CATH AND CORONARY ANGIOGRAPHY N/A 09/25/2020   Procedure: LEFT HEART CATH AND CORONARY ANGIOGRAPHY;  Surgeon: Leonie Man, MD;  Location: [facility];  Service: Cardiovascular;  Laterality: N/A;  . ORIF FEMUR FRACTURE    . RADIAL ARTERY HARVEST Left 10/09/2020   Procedure: RADIAL ARTERY HARVEST;  Surgeon: [name], MD;  Location: [facility];  Service: Open Heart Surgery;  Laterality: Left;  . TEE WITHOUT CARDIOVERSION N/A 10/09/2020   Procedure: TRANSESOPHAGEAL ECHOCARDIOGRAM (TEE);  Surgeon: [name], MD;  Location: [facility];  Service: Open Heart Surgery;  Laterality: N/A;    Family History  Problem Relation Age of Onset  . Heart disease Mother        CABG <50  . Hypertension Mother   . Diabetes Mother   . Hyperlipidemia Mother   . Heart disease Father   . Hyperlipidemia Father   . Hypertension Father   . Early death Father   . Healthy Daughter   . Diabetes Maternal Aunt   . Cancer Maternal Grandmother        stomach cancer  . Diabetes Maternal Aunt   . Cancer Cousin        colon cancer (dx'd 10)    Social History   Socioeconomic History  . Marital status: Single    Spouse name: Not on file  . Number of children: Not on file  . Years of education: Not on file  . Highest education level: Not on file  Occupational History  . Occupation: Event organiser  Tobacco Use  . Smoking status: Never Smoker  . Smokeless tobacco: Never Used  Vaping Use  . Vaping Use: Never used  Substance and Sexual Activity  . Alcohol use: No  . Drug use: No  . Sexual activity: Not Currently    Partners: Male    Comment: husband currently in [place], returning 02/2013  Other Topics Concern  . 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  Lives with 57 year old daughter, 1 cat Husband is living in [place] (citizen there), trying to come to [place] (has been delayed)   Social Determinants of Adult nurse Strain: High Risk  . Difficulty of Paying Living Expenses: Hard  Food Insecurity: Food Insecurity Present  . Worried About Charity fundraiser in the Last Year: Sometimes true  . Ran Out of Food in the Last Year: Sometimes true  Transportation Needs: No Transportation Needs  . Lack of Transportation (Medical): No  . Lack of Transportation (Non-Medical): No  Physical Activity: Not on file  Stress: Stress Concern Present  . Feeling of Stress : Rather much  Social Connections: Not on file  Intimate Partner Violence: Not on file    Outpatient Medications Prior to Visit  Medication Sig Dispense Refill  . acetaminophen (TYLENOL) 325 MG tablet Take 2 tablets (650 mg total) by mouth every 6 (six) hours as needed for mild pain or headache.    . albuterol (PROVENTIL HFA) 108 (90 Base) MCG/ACT inhaler Inhale 2 puffs into the lungs every 6 (six) hours as needed for wheezing. 18 g 12  .  aspirin EC 81 MG tablet Take 81 mg by mouth daily.    Marland Kitchen atorvastatin (LIPITOR) 80 MG tablet Take 1 tablet (80 mg total) by mouth daily. 30 tablet 0  . carvedilol (COREG) 12.5 MG tablet Take 1 tablet (12.5 mg total) by mouth 2 (two) times daily with a meal. 60 tablet 3  . clopidogrel (PLAVIX) 75 MG tablet Take 1 tablet (75 mg total) by mouth daily. 30 tablet 3  . dapagliflozin propanediol (FARXIGA) 10 MG TABS tablet Take 1 tablet (10 mg total) by mouth daily. 30 tablet 3  . gabapentin (NEURONTIN) 100 MG capsule Take 1 capsule (100 mg total) by mouth 3 (three) times daily. 270 capsule 3  . glipiZIDE (GLUCOTROL) 10 MG tablet TAKE 1 TABLET BY MOUTH TWICE DAILY BEFORE A MEAL (Patient taking differently: Take 10 mg by mouth 2 (two) times daily before a meal.) 60 tablet 11  . insulin glargine (LANTUS SOLOSTAR) 100 UNIT/ML Solostar Pen Inject 8 Units into the skin 2 (two) times daily.    . Insulin Pen Needle 29G X 5MM MISC Use with insulin 300 each 3  . isosorbide-hydrALAZINE (BIDIL) 20-37.5 MG tablet  Take 1 tablet by mouth 3 (three) times daily. 90 tablet 3  . Multiple Vitamin (MULTIVITAMIN WITH MINERALS) TABS tablet Take 1 tablet by mouth daily.    . nitroGLYCERIN (NITROSTAT) 0.4 MG SL tablet DISSOLVE 1 TABLET UNDER THE TONGUE EVERY 5 MINUTES FOR 3 DOSES AS NEEDED FOR CHEST PAIN 25 tablet 7  . oxyCODONE (OXY IR/ROXICODONE) 5 MG immediate release tablet Take 1 tablet (5 mg total) by mouth every 4 (four) hours as needed for severe pain. 30 tablet 0  . sacubitril-valsartan (ENTRESTO) 49-51 MG Take 1 tablet by mouth 2 (two) times daily. 60 tablet 3  . spironolactone (ALDACTONE) 25 MG tablet Take 1 tablet (25 mg total) by mouth daily. 30 tablet 3  . torsemide 40 MG TABS Take 40 mg by mouth daily. 30 tablet 3   No facility-administered medications prior to visit.    Allergies  Allergen Reactions  . Penicillins Rash    Historical Has patient had a PCN reaction causing immediate rash, facial/tongue/throat swelling, SOB or lightheadedness with hypotension: No Has patient had a PCN reaction causing severe rash involving mucus membranes or skin necrosis: No Has patient had a PCN reaction that required hospitalization: No Has patient had a PCN reaction occurring within the last 10 years: No If all of the above answers are "NO", then may proceed with Cephalosporin use.    ROS Review of Systems  Constitutional: Negative.   HENT: Negative.   Eyes: Negative.   Respiratory: Negative.   Cardiovascular: Negative.   Gastrointestinal: Positive for abdominal distention (obese).  Endocrine: Negative.   Genitourinary: Negative.   Skin: Negative.   Allergic/Immunologic: Negative.   Neurological: Positive for dizziness (occasional ) and headaches (occasional ).  Hematological: Negative.   Psychiatric/Behavioral: Negative.       Objective:    Physical Exam Vitals and nursing note reviewed.  Constitutional:      Appearance: Normal appearance. She is obese.  HENT:     Head: Normocephalic and  atraumatic.     Mouth/Throat:     Mouth: Mucous membranes are moist.     Pharynx: Oropharynx is clear.  Cardiovascular:     Rate and Rhythm: Normal rate and regular rhythm.     Pulses: Normal pulses.     Heart sounds: Normal heart sounds.  Pulmonary:     Effort: Pulmonary  effort is normal.     Breath sounds: Normal breath sounds.  Abdominal:     General: Bowel sounds are normal. There is distension (obese).     Palpations: Abdomen is soft.  Musculoskeletal:        General: Normal range of motion.     Cervical back: Normal range of motion and neck supple.  Skin:    General: Skin is warm and dry.  Neurological:     General: No focal deficit present.     Mental Status: She is alert and oriented to person, place, and time.  Psychiatric:        Mood and Affect: Mood normal.        Behavior: Behavior normal.        Thought Content: Thought content normal.        Judgment: Judgment normal.    BP 114/65   Pulse 86   Ht 5\' 4"  (1.626 m)   Wt 234 lb (106.1 kg)   LMP  (LMP Unknown)   BMI 40.17 kg/m  Wt Readings from Last 3 Encounters:  10/31/20 234 lb (106.1 kg)  10/27/20 234 lb 2.1 oz (106.2 kg)  09/27/20 244 lb 14.4 oz (111.1 kg)    Health Maintenance Due  Topic Date Due  . OPHTHALMOLOGY EXAM  Never done    There are no preventive care reminders to display for this patient.  Lab Results  Component Value Date   TSH 1.610 12/09/2019   Lab Results  Component Value Date   WBC 8.0 10/27/2020   HGB 8.4 (L) 10/27/2020   HCT 25.8 (L) 10/27/2020   MCV 83.5 10/27/2020   PLT 265 10/27/2020   Lab Results  Component Value Date   NA 138 10/27/2020   K 3.8 10/27/2020   CO2 31 10/27/2020   GLUCOSE 162 (H) 10/27/2020   BUN 12 10/27/2020   CREATININE 1.05 (H) 10/27/2020   BILITOT 0.4 10/15/2020   ALKPHOS 59 10/15/2020   AST 26 10/15/2020   ALT 25 10/15/2020   PROT 6.4 (L) 10/15/2020   ALBUMIN 2.2 (L) 10/15/2020   CALCIUM 8.6 (L) 10/27/2020   ANIONGAP 10 10/27/2020    Lab Results  Component Value Date   CHOL 131 09/25/2020   Lab Results  Component Value Date   HDL 30 (L) 09/25/2020   Lab Results  Component Value Date   LDLCALC 91 09/25/2020   Lab Results  Component Value Date   TRIG 57 10/03/2020   Lab Results  Component Value Date   CHOLHDL 4.4 09/25/2020   Lab Results  Component Value Date   HGBA1C 7.5 (H) 10/09/2020   Assessment & Plan:   1. Hospital discharge follow-up  2. Cerebrovascular accident (CVA), unspecified mechanism ([place]) No signs or symptoms of recurrence noted or reported today.   3. Pulmonary embolism, unspecified chronicity, unspecified pulmonary embolism type, unspecified whether acute cor pulmonale present ([place]) No signs or symptoms of recurrence noted or reported. Denies sudden onset of dyspnea and coughing. Denies Productive frothy, pink-tinged sputum. Denies tachycardia, pallor, feelings of impending doom.    4. S/P CABG x 4  5. NSTEMI (non-ST elevated myocardial infarction) ([place])  6. Shortness of breath Stable. No signs symptoms of respiratory distress noted or reported.   7. Gout of left knee, unspecified cause, unspecified chronicity Stable   8. Follow up She will follow up in 3 months.   No orders of the defined types were placed in this encounter.   No orders of the  defined types were placed in this encounter.   Referral Orders  No referral(s) requested today    Kathe Becton, MSN, ANE, FNP-BC [facility] [address] [place], [place] [address] [phone]- fax   Problem List Items Addressed This Visit      Cardiovascular and Mediastinum   NSTEMI (non-ST elevated myocardial infarction) ([place]) (Chronic)     Other   S/P CABG x 4   Shortness of breath    Other Visit Diagnoses    Hospital discharge follow-up    -  Primary   Cerebrovascular accident (CVA), unspecified mechanism  ([place])       Pulmonary embolism, unspecified chronicity, unspecified pulmonary embolism type, unspecified whether acute cor pulmonale present ([place])       Gout of left knee, unspecified cause, unspecified chronicity       Follow up          No orders of the defined types were placed in this encounter.   Follow-up: No follow-ups on file.    Azzie Glatter, FNP

## 2020-10-31 NOTE — Telephone Encounter (Signed)
Okay to give verbal order, verbal given.

## 2020-11-01 ENCOUNTER — Encounter: Payer: Self-pay | Admitting: Family Medicine

## 2020-11-02 ENCOUNTER — Other Ambulatory Visit: Payer: Self-pay | Admitting: Cardiothoracic Surgery

## 2020-11-02 DIAGNOSIS — Z951 Presence of aortocoronary bypass graft: Secondary | ICD-10-CM

## 2020-11-05 ENCOUNTER — Telehealth (HOSPITAL_COMMUNITY): Payer: Self-pay | Admitting: Pharmacy Technician

## 2020-11-05 ENCOUNTER — Telehealth: Payer: Self-pay | Admitting: Family Medicine

## 2020-11-05 ENCOUNTER — Telehealth: Payer: Self-pay | Admitting: *Deleted

## 2020-11-05 ENCOUNTER — Ambulatory Visit: Payer: Self-pay | Admitting: Cardiothoracic Surgery

## 2020-11-05 NOTE — Telephone Encounter (Signed)
Advanced Heart Failure Patient Advocate Encounter   Patient was approved to receive Entresto from [employer]  Patient ID: [id number] Effective dates: 10/29/20 through 10/29/21  Have yet to hear back on [employer] or [facility] application.  Called and spoke with patient. She is aware that there are 2 applications pending. Will call her back about those.

## 2020-11-05 NOTE — Telephone Encounter (Signed)
   LESLYN MONDA DOB: 1976-04-15 MRN: [id number]   RIDER WAIVER AND RELEASE OF LIABILITY  For purposes of improving physical access to our facilities, [facility] is pleased to partner with third parties to provide [facility] patients or other authorized individuals the option of convenient, on-demand ground transportation services (the [place]") through use of the technology service that enables users to request on-demand ground transportation from independent third-party providers.  By opting to use and accept these [employer], I, the undersigned, hereby agree on behalf of myself, and on behalf of any minor child using the [employer] for whom I am the parent or legal guardian, as follows:  1. Government social research officer provided to me are provided by independent third-party transportation providers who are not [employer] or employees and who are unaffiliated with [employer]. 2. [facility] is neither a transportation carrier nor a common or public carrier. 3. [facility] has no control over the quality or safety of the transportation that occurs as a result of the [employer]. 4. [facility] cannot guarantee that any third-party transportation provider will complete any arranged transportation service. 5. [facility] makes no representation, warranty, or guarantee regarding the reliability, timeliness, quality, safety, suitability, or availability of any of the Transport Services or that they will be error free. 6. I fully understand that traveling by vehicle involves risks and dangers of serious bodily injury, including permanent disability, paralysis, and death. I agree, on behalf of myself and on behalf of any minor child using the Transport Services for whom I am the parent or legal guardian, that the entire risk arising out of my use of the [employer] remains solely with me, to the maximum extent permitted under applicable law. 7. The [employer] are provided "as is" and "as available." [facility] disclaims all representations and warranties, express, implied or statutory, not expressly set out in these terms, including the implied warranties of merchantability and fitness for a particular purpose. 8. I hereby waive and release [facility], its agents, employees, officers, directors, representatives, insurers, attorneys, assigns, successors, subsidiaries, and affiliates from any and all past, present, or future claims, demands, liabilities, actions, causes of action, or suits of any kind directly or indirectly arising from acceptance and use of the [employer]. 9. I further waive and release [facility] and its affiliates from all present and future liability and responsibility for any injury or death to persons or damages to property caused by or related to the use of the [employer]. 10. I have read this Waiver and Release of Liability, and I understand the terms used in it and their legal significance. This Waiver is freely and voluntarily given with the understanding that my right (as well as the right of any minor child for whom I am the parent or legal guardian using the [employer]) to legal recourse against [facility] in connection with the [employer] is knowingly surrendered in return for use of these services.   I attest that I read the consent document to [name], gave Ms. Caudell the opportunity to ask questions and answered the questions asked (if any). I affirm that [name] then provided consent for she's participation in this program.     Legrand Pitts

## 2020-11-05 NOTE — Telephone Encounter (Signed)
Ms. Ogando mother called for her requesting to reschedule her f/u appt with Dr. Orvan Seen from today to another day. Patient's mother reports patient is just too tired today after taking morning dose of gabapentin to get up and make her appt. Appointment rescheduled for Thursday March 31st at 430pm. Patient acknowledges receipt and verbalizes understanding of rescheduled appt. No further questions at this time.

## 2020-11-06 ENCOUNTER — Ambulatory Visit: Payer: Self-pay | Admitting: Cardiovascular Disease

## 2020-11-06 NOTE — Telephone Encounter (Signed)
Advanced Heart Failure Patient Advocate Encounter   Patient was approved to receive Farxiga from [employer]  Patient ID: [id number] Effective dates: 11/05/20 through 11/04/21

## 2020-11-08 ENCOUNTER — Ambulatory Visit
Admission: RE | Admit: 2020-11-08 | Discharge: 2020-11-08 | Disposition: A | Discharge status: Home / Self Care | Payer: Self-pay | Source: Ambulatory Visit | Attending: Cardiothoracic Surgery | Admitting: Cardiothoracic Surgery

## 2020-11-08 ENCOUNTER — Ambulatory Visit (HOSPITAL_COMMUNITY)
Admit: 2020-11-08 | Discharge: 2020-11-08 | Disposition: A | Discharge status: Home / Self Care | Payer: Medicaid Other | Attending: Cardiology | Admitting: Cardiology

## 2020-11-08 ENCOUNTER — Encounter (HOSPITAL_COMMUNITY): Payer: Self-pay

## 2020-11-08 ENCOUNTER — Ambulatory Visit: Payer: Self-pay | Admitting: Cardiothoracic Surgery

## 2020-11-08 ENCOUNTER — Other Ambulatory Visit: Payer: Self-pay

## 2020-11-08 ENCOUNTER — Other Ambulatory Visit: Payer: Self-pay | Admitting: Cardiothoracic Surgery

## 2020-11-08 ENCOUNTER — Ambulatory Visit (INDEPENDENT_AMBULATORY_CARE_PROVIDER_SITE_OTHER): Payer: Self-pay | Admitting: Cardiothoracic Surgery

## 2020-11-08 VITALS — BP 90/56 | Wt 233.8 lb

## 2020-11-08 VITALS — BP 106/70 | HR 87 | Resp 20 | Ht 64.0 in | Wt 234.0 lb

## 2020-11-08 DIAGNOSIS — Z951 Presence of aortocoronary bypass graft: Secondary | ICD-10-CM

## 2020-11-08 DIAGNOSIS — Z8673 Personal history of transient ischemic attack (TIA), and cerebral infarction without residual deficits: Secondary | ICD-10-CM | POA: Insufficient documentation

## 2020-11-08 DIAGNOSIS — Z880 Allergy status to penicillin: Secondary | ICD-10-CM | POA: Diagnosis not present

## 2020-11-08 DIAGNOSIS — I2510 Atherosclerotic heart disease of native coronary artery without angina pectoris: Secondary | ICD-10-CM | POA: Diagnosis not present

## 2020-11-08 DIAGNOSIS — Z5941 Food insecurity: Secondary | ICD-10-CM | POA: Insufficient documentation

## 2020-11-08 DIAGNOSIS — Z79899 Other long term (current) drug therapy: Secondary | ICD-10-CM | POA: Diagnosis not present

## 2020-11-08 DIAGNOSIS — Y838 Other surgical procedures as the cause of abnormal reaction of the patient, or of later complication, without mention of misadventure at the time of the procedure: Secondary | ICD-10-CM | POA: Diagnosis not present

## 2020-11-08 DIAGNOSIS — Z596 Low income: Secondary | ICD-10-CM | POA: Diagnosis not present

## 2020-11-08 DIAGNOSIS — Z7982 Long term (current) use of aspirin: Secondary | ICD-10-CM | POA: Insufficient documentation

## 2020-11-08 DIAGNOSIS — T8189XA Other complications of procedures, not elsewhere classified, initial encounter: Secondary | ICD-10-CM | POA: Insufficient documentation

## 2020-11-08 DIAGNOSIS — Z8249 Family history of ischemic heart disease and other diseases of the circulatory system: Secondary | ICD-10-CM | POA: Insufficient documentation

## 2020-11-08 DIAGNOSIS — Z86711 Personal history of pulmonary embolism: Secondary | ICD-10-CM | POA: Diagnosis not present

## 2020-11-08 DIAGNOSIS — I255 Ischemic cardiomyopathy: Secondary | ICD-10-CM | POA: Insufficient documentation

## 2020-11-08 DIAGNOSIS — I110 Hypertensive heart disease with heart failure: Secondary | ICD-10-CM | POA: Diagnosis not present

## 2020-11-08 DIAGNOSIS — I480 Paroxysmal atrial fibrillation: Secondary | ICD-10-CM

## 2020-11-08 DIAGNOSIS — Z794 Long term (current) use of insulin: Secondary | ICD-10-CM | POA: Insufficient documentation

## 2020-11-08 DIAGNOSIS — Z7902 Long term (current) use of antithrombotics/antiplatelets: Secondary | ICD-10-CM | POA: Insufficient documentation

## 2020-11-08 DIAGNOSIS — E119 Type 2 diabetes mellitus without complications: Secondary | ICD-10-CM | POA: Diagnosis not present

## 2020-11-08 DIAGNOSIS — I252 Old myocardial infarction: Secondary | ICD-10-CM | POA: Diagnosis not present

## 2020-11-08 DIAGNOSIS — I5042 Chronic combined systolic (congestive) and diastolic (congestive) heart failure: Secondary | ICD-10-CM | POA: Diagnosis not present

## 2020-11-08 LAB — BASIC METABOLIC PANEL
Anion gap: 10 (ref 5–15)
BUN: 19 mg/dL (ref 6–20)
CO2: 29 mmol/L (ref 22–32)
Calcium: 9.1 mg/dL (ref 8.9–10.3)
Chloride: 96 mmol/L — ABNORMAL LOW (ref 98–111)
Creatinine, Ser: 1.1 mg/dL — ABNORMAL HIGH (ref 0.44–1.00)
GFR, Estimated: >60 mL/min (ref >60)
Glucose, Bld: 253 mg/dL — ABNORMAL HIGH (ref 70–99)
Potassium: 4.2 mmol/L (ref 3.5–5.1)
Sodium: 135 mmol/L (ref 135–145)

## 2020-11-08 LAB — CBC
HCT: 30.1 % — ABNORMAL LOW (ref 36.0–46.0)
Hemoglobin: 9.2 g/dL — ABNORMAL LOW (ref 12.0–15.0)
MCH: 27.6 pg (ref 26.0–34.0)
MCHC: 30.6 g/dL (ref 30.0–36.0)
MCV: 90.4 fL (ref 80.0–100.0)
Platelets: 375 10*3/uL (ref 150–400)
RBC: 3.33 MIL/uL — ABNORMAL LOW (ref 3.87–5.11)
RDW: 16.8 % — ABNORMAL HIGH (ref 11.5–15.5)
WBC: 6.1 10*3/uL (ref 4.0–10.5)
nRBC: 0 % (ref 0.0–0.2)

## 2020-11-08 MED ORDER — ISOSORB DINITRATE-HYDRALAZINE 20-37.5 MG PO TABS
0.5000 | ORAL_TABLET | Freq: Three times a day (TID) | ORAL | 3 refills | Status: DC
  Filled 2021-01-08: qty 45, 30d supply, fill #0
  Filled 2021-01-29: qty 45, 30d supply, fill #1

## 2020-11-08 MED ORDER — ENTRESTO 97-103 MG PO TABS: 1.0000 | ORAL_TABLET | Freq: Two times a day (BID) | ORAL | 3 refills | Status: DC

## 2020-11-08 MED ORDER — CARVEDILOL 6.25 MG PO TABS: 6.2500 mg | ORAL_TABLET | Freq: Two times a day (BID) | ORAL | 3 refills | Status: DC

## 2020-11-08 NOTE — Patient Instructions (Addendum)
Labs done today. We will contact you only if your labs are abnormal.  DECREASE Bidil to 1/2 tablet by mouth 3 times daily.  DECREASE Carvedilol (Coreg) to 6.25mg  (1 tablet) by mouth 2 times daily.  INCREASE Entresto to 97-103mg  (1 tablet) by mouth 2 times daily.  No other medication changes were made. Please continue all current medications as prescribed.  [address] will be in contact with you about getting the 30 day event monitor.   Your physician recommends that you schedule a follow-up appointment in: 1 week for a lab only appointment and in about 1 month for a follow up.    If you have any questions or concerns before your next appointment please send [place] a message through [facility] or call our office at [phone].    TO LEAVE A MESSAGE FOR THE NURSE SELECT OPTION 2, PLEASE LEAVE A MESSAGE INCLUDING: . YOUR NAME . DATE OF BIRTH . CALL BACK NUMBER . REASON FOR CALL**this is important as we prioritize the call backs  YOU WILL RECEIVE A CALL BACK THE SAME DAY AS LONG AS YOU CALL BEFORE 4:00 PM   Do the following things EVERYDAY: 1) Weigh yourself in the morning before breakfast. Write it down and keep it in a log. 2) Take your medicines as prescribed 3) Eat low salt foods--Limit salt (sodium) to 2000 mg per day.  4) Stay as active as you can everyday 5) Limit all fluids for the day to less than 2 liters   At the [facility], you and your health needs are our priority. As part of our continuing mission to provide you with exceptional heart care, we have created designated Provider Care Teams. These Care Teams include your primary Cardiologist (physician) and Advanced Practice Providers (APPs- Physician Assistants and Nurse Practitioners) who all work together to provide you with the care you need, when you need it.   You may see any of the following providers on your designated Care Team at your next follow up: Marland Kitchen Dr Glori Bickers . Dr Loralie Champagne . Darrick Grinder, NP . Lyda Jester, PA . Audry Riles, PharmD   Please be sure to bring in all your medications bottles to every appointment.

## 2020-11-08 NOTE — Progress Notes (Signed)
Advanced Heart Failure Clinic Note   Referring Physician: PCP: Azzie Glatter, FNP PCP-Cardiologist: Quay Burow, MD  Advanced Heart Failure MD: Dr. 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   She was first hospitalized from 2/14-2/17 for NSTEMI. Had presented w/ several week h/o progressive exertional CP and dyspnea. Ruled in for NSTEMI w/ Hs Trop peaking at 1,390. Echo showed moderately reduced LVEF at 35-40%. RV normal.  LHC showed severe multivessel CAD w/ mid LAD 95% followed by 70%, bifurcation high OM1 95%, bifurcation LCx-OM2 (65-70%), small caliber codominant RCA with 85% mid stenosis. LVEDP was elevated on cath at 33 mmHg. Her CAD was felt not amendable for PCI. Was evaluated by CT surgery and CABG was recommended however pt left AMA on 12/17.  She presented back to the ED on 10/02/20 via EMS w/ acute hypoxic respiratory failure 2/2 pulmonary edema and ? PNA.  Intubated in the ED. Had a run of VT and placed on IV amiodarone. K 3.1. Readmitted to CCU. Given IV Lasix 40 mg x 1. Repeat limited echo showed no significant interval change. LVEF remained 35-40%. RV not well visualized. No MR or ASD. CABG surgery again recommended. AHF team was consulted for med optimization prior to CABG. She was diuresed and completed abx for suspected aspiration PNA. Once stabilized, she underwent CABG  X 4 on 10/09/20. Required milrinone post op but able to wean off. Repeat echo, post CABG, showed improved LVEF  40-45%, RV mildly reduced. Repeat limited echo 3/16 EF 50-55%. Post op recovery was c/b left pleural effusion requiring thoracentesis as well as subacute cerebral ischemic infarcts. Brain MRI was performed given persistent dizziness, which showed  acute to early sub acute ischemic infarcts, and a remote lacunar infarct involving the right thalamus. She had no documented Afib during admission. Neurology was consulted and recommended 30 day monitor post discharged. She was discharged home on 3/19 on GDMT for systolic heart failure. D/c wt was 237 lb.   She returns to clinic today for f/u. In wheel chair. Wt down 4 lb at 233 lb but she has 1-2+ bilateral LEE on exam, L>R.  Denies resting dyspnea. No orthopnea/PND. NYHA Class II.  No CP. Reports dizziness w/ positional changes. BP is soft, 90/56. EKG shows NSR 87 bpm.   She also appears to have distal sternal wound w/ purulent drainage. Denies pain, fever or chills. Non toxix appearing. She has post surgical f/u w/ CT surgery later this afternoon.   Review of systems complete and found to be negative unless listed in HPI.      Past Medical History:  Diagnosis Date  . Coronary artery disease   . CVA (cerebral vascular accident) ([place]) 09/2020  . Diabetes mellitus without complication ([place])   . Diabetic neuropathy ([place]) 02/2020  . 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  . Hyperlipidemia age 47  . Hypertension age 70  . Hypocalcemia 11/2019  . Hypokalemia 11/2019  . Iron deficiency anemia   . NSTEMI (non-ST elevated myocardial infarction) ([place]) 09/2020  . Obesity   . Proteinuria 12/09/2019  . Pulmonary embolism ([place]) 09/2020    Current Outpatient Medications  Medication Sig Dispense Refill  . acetaminophen (TYLENOL) 325 MG tablet Take 2 tablets (650 mg total)  by mouth every 6 (six) hours as needed for mild pain or headache.    . albuterol (PROVENTIL HFA) 108 (90 Base) MCG/ACT inhaler Inhale 2 puffs into the lungs every 6 (six) hours as needed for wheezing. 18 g 12  . aspirin EC 81 MG tablet Take 81 mg by mouth daily.    Marland Kitchen atorvastatin (LIPITOR) 80 MG tablet Take 1 tablet (80 mg total) by mouth daily. 30 tablet 0  . carvedilol (COREG) 12.5 MG tablet Take 1 tablet (12.5 mg total) by mouth 2 (two) times  daily with a meal. 60 tablet 3  . clopidogrel (PLAVIX) 75 MG tablet Take 1 tablet (75 mg total) by mouth daily. 30 tablet 3  . dapagliflozin propanediol (FARXIGA) 10 MG TABS tablet Take 1 tablet (10 mg total) by mouth daily. 30 tablet 3  . gabapentin (NEURONTIN) 100 MG capsule Take 1 capsule (100 mg total) by mouth 3 (three) times daily. 270 capsule 3  . glipiZIDE (GLUCOTROL) 10 MG tablet TAKE 1 TABLET BY MOUTH TWICE DAILY BEFORE A MEAL (Patient taking differently: Take 10 mg by mouth 2 (two) times daily before a meal.) 60 tablet 11  . insulin glargine (LANTUS SOLOSTAR) 100 UNIT/ML Solostar Pen Inject 8 Units into the skin 2 (two) times daily.    . Insulin Pen Needle 29G X 5MM MISC Use with insulin 300 each 3  . isosorbide-hydrALAZINE (BIDIL) 20-37.5 MG tablet Take 1 tablet by mouth 3 (three) times daily. 90 tablet 3  . Multiple Vitamin (MULTIVITAMIN WITH MINERALS) TABS tablet Take 1 tablet by mouth daily.    . nitroGLYCERIN (NITROSTAT) 0.4 MG SL tablet DISSOLVE 1 TABLET UNDER THE TONGUE EVERY 5 MINUTES FOR 3 DOSES AS NEEDED FOR CHEST PAIN 25 tablet 7  . oxyCODONE (OXY IR/ROXICODONE) 5 MG immediate release tablet Take 1 tablet (5 mg total) by mouth every 4 (four) hours as needed for severe pain. 30 tablet 0  . sacubitril-valsartan (ENTRESTO) 49-51 MG Take 1 tablet by mouth 2 (two) times daily. 60 tablet 3  . spironolactone (ALDACTONE) 25 MG tablet Take 1 tablet (25 mg total) by mouth daily. 30 tablet 3  . torsemide 40 MG TABS Take 40 mg by mouth daily. 30 tablet 3   No current facility-administered medications for this encounter.    Allergies  Allergen Reactions  . Penicillins Rash    Historical Has patient had a PCN reaction causing immediate rash, facial/tongue/throat swelling, SOB or lightheadedness with hypotension: No Has patient had a PCN reaction causing severe rash involving mucus membranes or skin necrosis: No Has patient had a PCN reaction that required hospitalization: No Has  patient had a PCN reaction occurring within the last 10 years: No If all of the above answers are "NO", then may proceed with Cephalosporin use.      Social History   Socioeconomic History  . Marital status: Single    Spouse name: Not on file  . Number of children: Not on file  . Years of education: Not on file  . Highest education level: Not on file  Occupational History  . Occupation: Event organiser  Tobacco Use  . Smoking status: Never Smoker  . Smokeless tobacco: Never Used  Vaping Use  . Vaping Use: Never used  Substance and Sexual Activity  . Alcohol use: No  . Drug use: No  . Sexual activity: Not Currently    Partners: Male    Comment: husband currently in [place], returning 02/2013  Other Topics Concern  . 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  Lives with 74 year old daughter, 1 cat Husband is living in [place] (citizen there), trying to come to [place] (has been delayed)   Social Determinants of Radio broadcast assistant Strain: High Risk  . Difficulty of Paying Living Expenses: Hard  Food Insecurity: Food Insecurity Present  . Worried About Charity fundraiser in the Last Year: Sometimes true  . Ran Out of Food in the Last Year: Sometimes true  Transportation Needs: No Transportation Needs  . Lack of Transportation (Medical): No  . Lack of Transportation (Non-Medical): No  Physical Activity: Not on file  Stress: Stress Concern Present  . Feeling of Stress : Rather much  Social Connections: Not on file  Intimate Partner Violence: Not on file      Family History  Problem Relation Age of Onset  . Heart disease Mother        CABG <50  . Hypertension Mother   . Diabetes Mother   . Hyperlipidemia Mother   . Heart disease Father   . Hyperlipidemia Father   . Hypertension Father   . Early death Father   . Healthy Daughter   . Diabetes Maternal Aunt   . Cancer Maternal Grandmother         stomach cancer  . Diabetes Maternal Aunt   . Cancer Cousin        colon cancer (dx'd 28)    Vitals:   11/08/20 0858  BP: (!) 90/56  Weight: 106.1 kg (233 lb 12.8 oz)     PHYSICAL EXAM: General:  Well appearing, moderately obese, in wheel chair. No respiratory difficulty. No toxic appearing  HEENT: normal Neck: supple. no JVD. Carotids 2+ bilat; no bruits. No lymphadenopathy or thyromegaly appreciated. Cor: PMI nondisplaced. Regular rate & rhythm. No rubs, gallops or murmurs. + distal sternal wound at sternotomy site w/ purulent drainage  Lungs: clear Abdomen: soft, nontender, nondistended. No hepatosplenomegaly. No bruits or masses. Good bowel sounds. Extremities: no cyanosis, clubbing, rash, 1-2+ bilateral LE edema L>R  Neuro: alert & oriented x 3, cranial nerves grossly intact. moves all 4 extremities w/o difficulty. Affect pleasant.  ECG: NSR 87 bpm    ASSESSMENT & PLAN:  1. CAD: S/P CABG x4 on 10/09/20.  - Stable w/o CP  - Continue ASA 81 mg + Plavix 75 mg  - Atorvastatin 80 mg  2.  Chronic Combined Systolic/ Diastolic CHF: Echo (2/84) with EF 35-40%, mild LVH (similar this admission and last).  Ischemic CM now s/p CABG. Repeat echo post CABG 3/3 LVEF improved 40-45%, RV mildly reduced. Repeat limited echo 3/16 EF 50-55%.  - Mildly fluid overloaded on exam, NYHA Class II - BP soft 90/56 - Reduce Bidil to 1/2 tablet tid - Reduce Coreg to 6.25 mg bid - Increase Entresto to 97-103 mg bid  - Continue torsemide 40 daily  - Continue Farxiga 10 mg daily  - Continue spironolactone 25 mg daily.  - BMP today and again in 7 days  3. Type 2 diabetes: Control improved recently. Hgb A1c 7.5 - Now on Farxiga.  - No change 4. CVA: Brain MRI completed post CABG given persistent dizziness, showed acute to early sub acute ischemic infarcts, and a remote lacunar infarct involving the right thalamus. Echo w/ no cardiac source. No Afib detection during hospitalization   - Neuro recommended  30 day monitor to r/o  PAF. I will arrange. If unremarkable, refer to EP for Loop recorder  5. Sternal Wound - luckily she has f/u w/ CT surgery today and they will address - may need abx, will check CBC   F/u in 2-3 weeks for further med titration    Lyda Jester, PA-C 11/08/20

## 2020-11-10 ENCOUNTER — Other Ambulatory Visit (HOSPITAL_COMMUNITY)
Admission: RE | Admit: 2020-11-10 | Discharge: 2020-11-10 | Disposition: A | Discharge status: Home / Self Care | Payer: Medicaid Other | Source: Ambulatory Visit | Attending: Cardiothoracic Surgery | Admitting: Cardiothoracic Surgery

## 2020-11-10 DIAGNOSIS — Z20822 Contact with and (suspected) exposure to covid-19: Secondary | ICD-10-CM | POA: Insufficient documentation

## 2020-11-10 DIAGNOSIS — Z01812 Encounter for preprocedural laboratory examination: Secondary | ICD-10-CM | POA: Insufficient documentation

## 2020-11-10 LAB — SARS CORONAVIRUS 2 (TAT 6-24 HRS): SARS Coronavirus 2: NEGATIVE

## 2020-11-11 ENCOUNTER — Other Ambulatory Visit: Payer: Self-pay

## 2020-11-13 ENCOUNTER — Other Ambulatory Visit: Payer: Self-pay

## 2020-11-13 ENCOUNTER — Other Ambulatory Visit (HOSPITAL_COMMUNITY): Payer: Self-pay

## 2020-11-13 ENCOUNTER — Ambulatory Visit (HOSPITAL_COMMUNITY): Admission: RE | Admit: 2020-11-13 | Payer: Self-pay | Source: Ambulatory Visit

## 2020-11-13 ENCOUNTER — Other Ambulatory Visit: Payer: Self-pay | Admitting: *Deleted

## 2020-11-13 NOTE — Patient Outreach (Signed)
[facility]) Care Management  11/13/2020  COURTNY BENNISON 1976-06-02 [id number]   [facility] outreach for EMMI-stroke  RED ON EMMI ALERT Day #    9       Date: 11/06/20 1000 Tuesday Red Alert Reason: Sad, hopeless, anxious, or empty? Yes Issued smoking cessation, thinking about quitting smoking and stroke preventing secondary stroke EMMIs  Insurance: Medicaid pending  [facility] admissions x 2 ED visits x 2 in the last 6 months  [place] admission [facility] Unsuccessful outreach   Outreach attempt to the home number  No answer. [facility] RN CM left HIPAA [facility] Portability and Accountability Act) compliant voicemail message along with CM's contact info.   EMMI: pending    Plan: [facility] RN CM scheduled this patient for another call attempt within 4-7 business days  Yunior Jain L. [name], RN, BSN, [place] Coordinator Office number [phone] Mobile number [phone]  Main [facility] number [phone] Fax number [phone]

## 2020-11-13 NOTE — Addendum Note (Signed)
Encounter addended by: Scarlette Calico, RN on: 11/13/2020 5:12 PM  Actions taken: Order Reconciliation Section accessed

## 2020-11-14 ENCOUNTER — Other Ambulatory Visit (HOSPITAL_COMMUNITY): Payer: Self-pay

## 2020-11-15 ENCOUNTER — Other Ambulatory Visit: Payer: Self-pay

## 2020-11-15 ENCOUNTER — Ambulatory Visit (INDEPENDENT_AMBULATORY_CARE_PROVIDER_SITE_OTHER): Payer: Medicaid Other | Admitting: Cardiothoracic Surgery

## 2020-11-15 ENCOUNTER — Other Ambulatory Visit (HOSPITAL_COMMUNITY): Payer: Self-pay

## 2020-11-15 VITALS — BP 132/83 | HR 90 | Temp 97.6°F | Resp 20 | Ht 64.0 in | Wt 233.0 lb

## 2020-11-15 DIAGNOSIS — Z951 Presence of aortocoronary bypass graft: Secondary | ICD-10-CM

## 2020-11-15 MED ORDER — SILVER SULFADIAZINE 1 % EX CREA: TOPICAL_CREAM | CUTANEOUS | 1 refills | Status: DC

## 2020-11-16 ENCOUNTER — Other Ambulatory Visit (HOSPITAL_COMMUNITY): Payer: Self-pay

## 2020-11-19 ENCOUNTER — Ambulatory Visit (HOSPITAL_COMMUNITY): Payer: Self-pay

## 2020-11-19 ENCOUNTER — Ambulatory Visit (HOSPITAL_COMMUNITY): Payer: MEDICAID

## 2020-11-20 ENCOUNTER — Other Ambulatory Visit: Payer: Self-pay | Admitting: *Deleted

## 2020-11-20 NOTE — Patient Outreach (Signed)
[place] [facility]) Care Management  11/20/2020  Tiffany Le Nov 27, 1975 [id number]   [facility] second unsuccessful outreach for EMMI-stroke  RED ON EMMI ALERT Day #    9       Date: 11/06/20 1000 Tuesday Red Alert Reason: Sad, hopeless, anxious, or empty? Yes Issued smoking cessation, thinking about quitting smoking and stroke preventing secondary stroke EMMIs  Insurance: Medicaid pending  [facility] admissions x 2 ED visits x 2 in the last 6 months  [place] admission  [facility] Unsuccessful outreach   Outreach attempt to the home number  No answer. [facility] RN CM left HIPAA [facility] Portability and Accountability Act) compliant voicemail message along with CM's contact info.   EMMI: pending    Plan: [facility] RN CM scheduled this patient for a third call attempt within 4-7 business days Unsuccessful outreaches on 11/13/20 and 11/20/20 Unsuccessful [facility] letter sent on 11/13/20  Joelene Millin L. [name], RN, BSN, [place] Coordinator Office number [phone] Mobile number [phone]  Main [facility] number [phone] Fax number [phone]

## 2020-11-21 ENCOUNTER — Other Ambulatory Visit (HOSPITAL_COMMUNITY): Payer: Self-pay

## 2020-11-21 ENCOUNTER — Encounter (HOSPITAL_COMMUNITY): Payer: Self-pay

## 2020-11-21 ENCOUNTER — Ambulatory Visit (HOSPITAL_COMMUNITY): Admission: RE | Admit: 2020-11-21 | Payer: MEDICAID | Source: Ambulatory Visit

## 2020-11-26 ENCOUNTER — Ambulatory Visit: Payer: Self-pay | Admitting: Cardiothoracic Surgery

## 2020-11-26 ENCOUNTER — Other Ambulatory Visit: Payer: Self-pay

## 2020-11-26 ENCOUNTER — Other Ambulatory Visit: Payer: Self-pay | Admitting: *Deleted

## 2020-11-26 NOTE — Patient Outreach (Signed)
[place] [facility]) Care Management  11/26/2020  Tiffany Le 23-Oct-1975 [id number]   [facility] successful outreach for EMMI-stroke  RED ON EMMI ALERT Day #9Date: 11/06/20 1000 Tuesday Red Alert Reason: Sad, hopeless, anxious, or empty? Yes Issued smoking cessation, thinking about quitting smoking and stroke preventing secondary stroke EMMIs  Insurance:Medicaid pending- Not on APL [facility] admissions x2ED visits x 2in the last 6 months  [place] admission   EMMI Sad, hopeless, anxious, or empty ? Yes - This answer was correct per pt at the time of the [facility] outreach but since has been resolved [place] Patient Outreach Telephone from 11/26/2020 in [place] Coordination  PHQ-2 Total Score 1      Saw MD on  11/08/20 Medications  Tiffany Le reports she had been having sad, anxious symptoms because she ran out of medicine, no refills and did not know how to navigate or coordinate care after her discharge She reports calling the hospital and receiving assistance that decreased the symptoms  She has an appointment  with her surgeon today 11/26/20 and reports she will address these needs also  [facility] RN CM reviewed with Tiffany Le the [facility] program and services to include care coordination and disease management to assist with navigating and bridging her concerns after discharge like medication management, appointments, DME, etc She voiced understanding [facility] SW services reviewed and offered but voiced not needed at this time    Plan: Patient agrees to care plan and follow up within the next 30 business days Pt encouraged to return a call to [name] CM prn   Alano Blasco L. [name], RN, BSN, [place] Coordinator Office number [phone] Mobile number [phone]  Main [facility] number [phone] Fax number [phone]

## 2020-11-27 ENCOUNTER — Telehealth: Payer: Self-pay

## 2020-11-27 ENCOUNTER — Other Ambulatory Visit: Payer: Self-pay

## 2020-11-27 DIAGNOSIS — Z131 Encounter for screening for diabetes mellitus: Secondary | ICD-10-CM

## 2020-11-27 DIAGNOSIS — E1140 Type 2 diabetes mellitus with diabetic neuropathy, unspecified: Secondary | ICD-10-CM

## 2020-11-27 MED ORDER — GABAPENTIN 100 MG PO CAPS
100.0000 mg | ORAL_CAPSULE | Freq: Three times a day (TID) | ORAL | 3 refills | Status: DC
  Filled 2021-02-05: qty 90, 30d supply, fill #0

## 2020-11-27 MED ORDER — ATORVASTATIN CALCIUM 80 MG PO TABS: 1.0000 | ORAL_TABLET | Freq: Every day | ORAL | 3 refills | Status: DC

## 2020-11-27 MED ORDER — SPIRONOLACTONE 25 MG PO TABS
25.0000 mg | ORAL_TABLET | Freq: Every day | ORAL | 3 refills | Status: DC
  Filled 2021-01-08: qty 30, 30d supply, fill #0
  Filled 2021-02-12 – 2021-02-28 (×2): qty 30, 30d supply, fill #1

## 2020-11-27 MED ORDER — CARVEDILOL 6.25 MG PO TABS: 6.2500 mg | ORAL_TABLET | Freq: Two times a day (BID) | ORAL | 3 refills | Status: DC

## 2020-11-27 MED ORDER — CLOPIDOGREL BISULFATE 75 MG PO TABS: 75.0000 mg | ORAL_TABLET | Freq: Every day | ORAL | 3 refills | Status: DC

## 2020-11-27 MED ORDER — GLIPIZIDE 10 MG PO TABS: ORAL_TABLET | ORAL | 11 refills | Status: DC

## 2020-11-27 NOTE — Telephone Encounter (Signed)
Clide Dales, PT with home health contacted the office to state patient cancelled PT for this week and a day would be made up for next week.  Called back and left voicemail acknowledging receipt.

## 2020-11-27 NOTE — Telephone Encounter (Signed)
Called and spoke w/ patient, informed pt that refill were sent to the pharmacy  [facility] by the hospital . Explain to the patient she need go the pharmacy to pick up rx.

## 2020-11-27 NOTE — Telephone Encounter (Signed)
Patient contacted the office stating that she needed to reschedule her wound check appointment for today. She did not have a ride to bring her to her appointment.  She stated that she is keeping the wound open to air and applying a cream to the site that Dr. Orvan Seen gave her.  She was unaware of the name of the medication cream.  Rescheduled her appointment to next Thursday, 12/06/20, patient acknowledged receipt.

## 2020-11-27 NOTE — Telephone Encounter (Signed)
Med refill,  Atorvastatin 80 mg cardilol 12.5 plavix 75 mg Gabapentin 100 mg Glipizide 10 mg  Spironolactone 25 mg Torsemide 40 mg  [facility]

## 2020-12-06 ENCOUNTER — Ambulatory Visit: Payer: Self-pay | Admitting: Cardiothoracic Surgery

## 2020-12-11 ENCOUNTER — Other Ambulatory Visit: Payer: Self-pay | Admitting: *Deleted

## 2020-12-11 NOTE — Patient Outreach (Signed)
[place] [facility]) Care Management  12/11/2020  Tiffany Le May 01, 1976 [id number]   [facility] Unsuccessful outreach  Tiffany Le was referred to [facility] on 11/07/20 for EMMI stroke RED ON EMMI ALERT Day #9Date: 11/06/20 1000 Tuesday Red Alert Reason: Sad, hopeless, anxious, or empty? Yes Issued smoking cessation, thinking about quitting smoking and stroke preventing secondary stroke EMMIs  Insurance:Medicaid pending- Not on APL [facility] admissions x2ED visits x 2in the last 6 months  [place] admission    Outreach attempt to the home number [phone] No answer. [facility] RN CM left HIPAA [facility] Portability and Accountability Act) compliant voicemail message along with CM's contact info.   Plan: [facility] RN CM scheduled this patient for another call attempt within 4-7 business days  Nathanie Ottley L. [name], RN, BSN, [place] Coordinator Office number [phone] Mobile number [phone]  Main [facility] number [phone] Fax number [phone]

## 2020-12-14 ENCOUNTER — Encounter (HOSPITAL_COMMUNITY): Payer: Self-pay | Admitting: Thoracic Surgery (Cardiothoracic Vascular Surgery)

## 2020-12-14 ENCOUNTER — Telehealth: Payer: Self-pay | Admitting: *Deleted

## 2020-12-14 ENCOUNTER — Inpatient Hospital Stay (HOSPITAL_COMMUNITY): Payer: Medicaid Other

## 2020-12-14 ENCOUNTER — Inpatient Hospital Stay (HOSPITAL_COMMUNITY)
Admission: AD | Admit: 2020-12-14 | Discharge: 2020-12-28 | DRG: 902 | Disposition: A | Discharge status: Home / Self Care | Payer: Medicaid Other | Attending: Cardiothoracic Surgery | Admitting: Cardiothoracic Surgery

## 2020-12-14 ENCOUNTER — Encounter (HOSPITAL_COMMUNITY)
Admission: AD | Disposition: A | Discharge status: Home / Self Care | Payer: Self-pay | Source: Home / Self Care | Attending: Cardiothoracic Surgery

## 2020-12-14 ENCOUNTER — Other Ambulatory Visit: Payer: Self-pay

## 2020-12-14 DIAGNOSIS — I110 Hypertensive heart disease with heart failure: Secondary | ICD-10-CM | POA: Diagnosis present

## 2020-12-14 DIAGNOSIS — Z6841 Body Mass Index (BMI) 40.0 and over, adult: Secondary | ICD-10-CM | POA: Diagnosis not present

## 2020-12-14 DIAGNOSIS — E785 Hyperlipidemia, unspecified: Secondary | ICD-10-CM | POA: Diagnosis present

## 2020-12-14 DIAGNOSIS — E1140 Type 2 diabetes mellitus with diabetic neuropathy, unspecified: Secondary | ICD-10-CM | POA: Diagnosis present

## 2020-12-14 DIAGNOSIS — I5042 Chronic combined systolic (congestive) and diastolic (congestive) heart failure: Secondary | ICD-10-CM | POA: Diagnosis present

## 2020-12-14 DIAGNOSIS — Z83438 Family history of other disorder of lipoprotein metabolism and other lipidemia: Secondary | ICD-10-CM

## 2020-12-14 DIAGNOSIS — I2510 Atherosclerotic heart disease of native coronary artery without angina pectoris: Secondary | ICD-10-CM | POA: Diagnosis present

## 2020-12-14 DIAGNOSIS — Z8249 Family history of ischemic heart disease and other diseases of the circulatory system: Secondary | ICD-10-CM | POA: Diagnosis not present

## 2020-12-14 DIAGNOSIS — Y838 Other surgical procedures as the cause of abnormal reaction of the patient, or of later complication, without mention of misadventure at the time of the procedure: Secondary | ICD-10-CM | POA: Diagnosis present

## 2020-12-14 DIAGNOSIS — E1165 Type 2 diabetes mellitus with hyperglycemia: Secondary | ICD-10-CM | POA: Diagnosis present

## 2020-12-14 DIAGNOSIS — Z8673 Personal history of transient ischemic attack (TIA), and cerebral infarction without residual deficits: Secondary | ICD-10-CM

## 2020-12-14 DIAGNOSIS — I9789 Other postprocedural complications and disorders of the circulatory system, not elsewhere classified: Secondary | ICD-10-CM

## 2020-12-14 DIAGNOSIS — Z7902 Long term (current) use of antithrombotics/antiplatelets: Secondary | ICD-10-CM

## 2020-12-14 DIAGNOSIS — Z951 Presence of aortocoronary bypass graft: Secondary | ICD-10-CM

## 2020-12-14 DIAGNOSIS — T8132XA Disruption of internal operation (surgical) wound, not elsewhere classified, initial encounter: Secondary | ICD-10-CM | POA: Diagnosis not present

## 2020-12-14 DIAGNOSIS — Z86711 Personal history of pulmonary embolism: Secondary | ICD-10-CM | POA: Diagnosis not present

## 2020-12-14 DIAGNOSIS — Z833 Family history of diabetes mellitus: Secondary | ICD-10-CM

## 2020-12-14 DIAGNOSIS — Z7982 Long term (current) use of aspirin: Secondary | ICD-10-CM

## 2020-12-14 DIAGNOSIS — E876 Hypokalemia: Secondary | ICD-10-CM | POA: Diagnosis not present

## 2020-12-14 DIAGNOSIS — T8149XA Infection following a procedure, other surgical site, initial encounter: Secondary | ICD-10-CM | POA: Diagnosis present

## 2020-12-14 DIAGNOSIS — E7800 Pure hypercholesterolemia, unspecified: Secondary | ICD-10-CM | POA: Diagnosis present

## 2020-12-14 DIAGNOSIS — Z794 Long term (current) use of insulin: Secondary | ICD-10-CM | POA: Diagnosis not present

## 2020-12-14 DIAGNOSIS — T8131XA Disruption of external operation (surgical) wound, not elsewhere classified, initial encounter: Secondary | ICD-10-CM | POA: Diagnosis present

## 2020-12-14 DIAGNOSIS — T8132XS Disruption of internal operation (surgical) wound, not elsewhere classified, sequela: Secondary | ICD-10-CM

## 2020-12-14 DIAGNOSIS — Z880 Allergy status to penicillin: Secondary | ICD-10-CM

## 2020-12-14 DIAGNOSIS — I509 Heart failure, unspecified: Secondary | ICD-10-CM

## 2020-12-14 DIAGNOSIS — Z20822 Contact with and (suspected) exposure to covid-19: Secondary | ICD-10-CM | POA: Diagnosis present

## 2020-12-14 DIAGNOSIS — Z79899 Other long term (current) drug therapy: Secondary | ICD-10-CM | POA: Diagnosis not present

## 2020-12-14 DIAGNOSIS — L299 Pruritus, unspecified: Secondary | ICD-10-CM | POA: Diagnosis not present

## 2020-12-14 DIAGNOSIS — I252 Old myocardial infarction: Secondary | ICD-10-CM

## 2020-12-14 DIAGNOSIS — R112 Nausea with vomiting, unspecified: Secondary | ICD-10-CM | POA: Diagnosis not present

## 2020-12-14 HISTORY — PX: STERNAL WOUND DEBRIDEMENT: SHX1058

## 2020-12-14 HISTORY — PX: APPLICATION OF WOUND VAC: SHX5189

## 2020-12-14 LAB — GLUCOSE, CAPILLARY
Glucose-Capillary: 100 mg/dL — ABNORMAL HIGH (ref 70–99)
Glucose-Capillary: 140 mg/dL — ABNORMAL HIGH (ref 70–99)
Glucose-Capillary: 188 mg/dL — ABNORMAL HIGH (ref 70–99)
Glucose-Capillary: 58 mg/dL — ABNORMAL LOW (ref 70–99)
Glucose-Capillary: 70 mg/dL (ref 70–99)

## 2020-12-14 LAB — POCT I-STAT, CHEM 8
BUN: 9 mg/dL (ref 6–20)
Calcium, Ion: 1.26 mmol/L (ref 1.15–1.40)
Chloride: 103 mmol/L (ref 98–111)
Creatinine, Ser: 0.6 mg/dL (ref 0.44–1.00)
Glucose, Bld: 254 mg/dL — ABNORMAL HIGH (ref 70–99)
HCT: 29 % — ABNORMAL LOW (ref 36.0–46.0)
Hemoglobin: 9.9 g/dL — ABNORMAL LOW (ref 12.0–15.0)
Potassium: 3.6 mmol/L (ref 3.5–5.1)
Sodium: 141 mmol/L (ref 135–145)
TCO2: 24 mmol/L (ref 22–32)

## 2020-12-14 LAB — CBC
HCT: 34.6 % — ABNORMAL LOW (ref 36.0–46.0)
Hemoglobin: 10.2 g/dL — ABNORMAL LOW (ref 12.0–15.0)
MCH: 26.9 pg (ref 26.0–34.0)
MCHC: 29.5 g/dL — ABNORMAL LOW (ref 30.0–36.0)
MCV: 91.3 fL (ref 80.0–100.0)
Platelets: 305 10*3/uL (ref 150–400)
RBC: 3.79 MIL/uL — ABNORMAL LOW (ref 3.87–5.11)
RDW: 14.5 % (ref 11.5–15.5)
WBC: 5.9 10*3/uL (ref 4.0–10.5)
nRBC: 0 % (ref 0.0–0.2)

## 2020-12-14 LAB — BASIC METABOLIC PANEL
Anion gap: 6 (ref 5–15)
BUN: 9 mg/dL (ref 6–20)
CO2: 25 mmol/L (ref 22–32)
Calcium: 8.7 mg/dL — ABNORMAL LOW (ref 8.9–10.3)
Chloride: 105 mmol/L (ref 98–111)
Creatinine, Ser: 0.8 mg/dL (ref 0.44–1.00)
GFR, Estimated: >60 mL/min (ref >60)
Glucose, Bld: 117 mg/dL — ABNORMAL HIGH (ref 70–99)
Potassium: 3.6 mmol/L (ref 3.5–5.1)
Sodium: 136 mmol/L (ref 135–145)

## 2020-12-14 LAB — SARS CORONAVIRUS 2 BY RT PCR (HOSPITAL ORDER, PERFORMED IN ~~LOC~~ HOSPITAL LAB): SARS Coronavirus 2: NEGATIVE

## 2020-12-14 LAB — POCT PREGNANCY, URINE: Preg Test, Ur: NEGATIVE

## 2020-12-14 SURGERY — DEBRIDEMENT, WOUND, STERNUM
Anesthesia: General

## 2020-12-14 MED ORDER — ORAL CARE MOUTH RINSE: 15.0000 mL | Freq: Once | OROMUCOSAL | Status: AC

## 2020-12-14 MED ORDER — ASPIRIN EC 81 MG PO TBEC
81.0000 mg | DELAYED_RELEASE_TABLET | Freq: Every day | ORAL | Status: DC
  Administered 2020-12-15 – 2020-12-28 (×14): 81 mg via ORAL
  Filled 2020-12-14 (×14): qty 1

## 2020-12-14 MED ORDER — SACUBITRIL-VALSARTAN 49-51 MG PO TABS
1.0000 | ORAL_TABLET | Freq: Two times a day (BID) | ORAL | Status: DC
  Administered 2020-12-15 – 2020-12-28 (×27): 1 via ORAL
  Filled 2020-12-14 (×28): qty 1

## 2020-12-14 MED ORDER — CEFAZOLIN SODIUM-DEXTROSE 2-4 GM/100ML-% IV SOLN
2.0000 g | Freq: Three times a day (TID) | INTRAVENOUS | Status: AC
  Administered 2020-12-15 (×2): 2 g via INTRAVENOUS
  Filled 2020-12-14 (×3): qty 100

## 2020-12-14 MED ORDER — SUGAMMADEX SODIUM 200 MG/2ML IV SOLN
INTRAVENOUS | Status: DC | PRN
  Administered 2020-12-14: 200 mg via INTRAVENOUS
  Administered 2020-12-14: 100 mg via INTRAVENOUS
  Administered 2020-12-14: 200 mg via INTRAVENOUS

## 2020-12-14 MED ORDER — FENTANYL CITRATE (PF) 250 MCG/5ML IJ SOLN
INTRAMUSCULAR | Status: AC
  Filled 2020-12-14: qty 5

## 2020-12-14 MED ORDER — ISOSORB DINITRATE-HYDRALAZINE 20-37.5 MG PO TABS
1.0000 | ORAL_TABLET | Freq: Three times a day (TID) | ORAL | Status: DC
  Administered 2020-12-15 – 2020-12-28 (×39): 1 via ORAL
  Filled 2020-12-14 (×39): qty 1

## 2020-12-14 MED ORDER — CEFAZOLIN SODIUM 1 G IJ SOLR
INTRAMUSCULAR | Status: AC
  Filled 2020-12-14: qty 20

## 2020-12-14 MED ORDER — ADULT MULTIVITAMIN W/MINERALS CH
1.0000 | ORAL_TABLET | Freq: Every day | ORAL | Status: DC
  Administered 2020-12-15 – 2020-12-28 (×12): 1 via ORAL
  Filled 2020-12-14 (×14): qty 1

## 2020-12-14 MED ORDER — OXYCODONE HCL 5 MG PO TABS
5.0000 mg | ORAL_TABLET | ORAL | Status: DC | PRN
  Filled 2020-12-14: qty 1

## 2020-12-14 MED ORDER — ATORVASTATIN CALCIUM 80 MG PO TABS
80.0000 mg | ORAL_TABLET | Freq: Every day | ORAL | Status: DC
  Administered 2020-12-15 – 2020-12-28 (×14): 80 mg via ORAL
  Filled 2020-12-14 (×15): qty 1

## 2020-12-14 MED ORDER — MIDAZOLAM HCL 5 MG/5ML IJ SOLN
INTRAMUSCULAR | Status: DC | PRN
  Administered 2020-12-14: 2 mg via INTRAVENOUS

## 2020-12-14 MED ORDER — DEXAMETHASONE SODIUM PHOSPHATE 10 MG/ML IJ SOLN
INTRAMUSCULAR | Status: AC
  Filled 2020-12-14: qty 1

## 2020-12-14 MED ORDER — CARVEDILOL 6.25 MG PO TABS
6.2500 mg | ORAL_TABLET | Freq: Two times a day (BID) | ORAL | Status: DC
  Administered 2020-12-15 – 2020-12-17 (×6): 6.25 mg via ORAL
  Filled 2020-12-14 (×6): qty 1

## 2020-12-14 MED ORDER — ONDANSETRON HCL 4 MG/2ML IJ SOLN: 4.0000 mg | Freq: Once | INTRAMUSCULAR | Status: DC | PRN

## 2020-12-14 MED ORDER — SODIUM BICARBONATE 8.4 % IV SOLN
INTRAVENOUS | Status: AC
  Filled 2020-12-14: qty 50

## 2020-12-14 MED ORDER — ACETAMINOPHEN 325 MG PO TABS: 650.0000 mg | ORAL_TABLET | Freq: Four times a day (QID) | ORAL | Status: DC | PRN

## 2020-12-14 MED ORDER — PHENYLEPHRINE 40 MCG/ML (10ML) SYRINGE FOR IV PUSH (FOR BLOOD PRESSURE SUPPORT)
PREFILLED_SYRINGE | INTRAVENOUS | Status: DC | PRN
  Administered 2020-12-14: 80 ug via INTRAVENOUS

## 2020-12-14 MED ORDER — ONDANSETRON HCL 4 MG/2ML IJ SOLN
INTRAMUSCULAR | Status: AC
  Filled 2020-12-14: qty 2

## 2020-12-14 MED ORDER — VANCOMYCIN HCL 1000 MG IV SOLR
INTRAVENOUS | Status: AC
  Filled 2020-12-14: qty 1000

## 2020-12-14 MED ORDER — CEFAZOLIN SODIUM-DEXTROSE 2-4 GM/100ML-% IV SOLN
2.0000 g | Freq: Once | INTRAVENOUS | Status: AC
  Administered 2020-12-14: 2 g via INTRAVENOUS

## 2020-12-14 MED ORDER — MIDAZOLAM HCL 2 MG/2ML IJ SOLN
INTRAMUSCULAR | Status: AC
  Filled 2020-12-14: qty 2

## 2020-12-14 MED ORDER — CHLORHEXIDINE GLUCONATE 0.12 % MT SOLN
OROMUCOSAL | Status: AC
  Filled 2020-12-14: qty 15

## 2020-12-14 MED ORDER — DEXTROSE 50 % IV SOLN
INTRAVENOUS | Status: AC
  Filled 2020-12-14: qty 50

## 2020-12-14 MED ORDER — DAPAGLIFLOZIN PROPANEDIOL 10 MG PO TABS
10.0000 mg | ORAL_TABLET | Freq: Every day | ORAL | Status: DC
  Administered 2020-12-15 – 2020-12-28 (×14): 10 mg via ORAL
  Filled 2020-12-14 (×14): qty 1

## 2020-12-14 MED ORDER — SODIUM CHLORIDE 0.9 % IR SOLN
Status: DC | PRN
  Administered 2020-12-14: 2000 mL

## 2020-12-14 MED ORDER — INSULIN GLARGINE 100 UNIT/ML ~~LOC~~ SOLN
8.0000 [IU] | Freq: Two times a day (BID) | SUBCUTANEOUS | Status: DC
  Administered 2020-12-15 – 2020-12-28 (×27): 8 [IU] via SUBCUTANEOUS
  Filled 2020-12-14 (×31): qty 0.08

## 2020-12-14 MED ORDER — PROPOFOL 10 MG/ML IV BOLUS
INTRAVENOUS | Status: DC | PRN
  Administered 2020-12-14: 120 mg via INTRAVENOUS

## 2020-12-14 MED ORDER — PROTAMINE SULFATE 10 MG/ML IV SOLN
INTRAVENOUS | Status: AC
  Filled 2020-12-14: qty 25

## 2020-12-14 MED ORDER — TORSEMIDE 20 MG PO TABS
40.0000 mg | ORAL_TABLET | Freq: Every day | ORAL | Status: DC
  Administered 2020-12-15 – 2020-12-28 (×14): 40 mg via ORAL
  Filled 2020-12-14 (×14): qty 2

## 2020-12-14 MED ORDER — SUGAMMADEX SODIUM 500 MG/5ML IV SOLN
INTRAVENOUS | Status: AC
  Filled 2020-12-14: qty 5

## 2020-12-14 MED ORDER — SPIRONOLACTONE 25 MG PO TABS: 25.0000 mg | ORAL_TABLET | Freq: Every day | ORAL | Status: DC

## 2020-12-14 MED ORDER — FENTANYL CITRATE (PF) 100 MCG/2ML IJ SOLN: 25.0000 ug | INTRAMUSCULAR | Status: DC | PRN

## 2020-12-14 MED ORDER — VANCOMYCIN HCL 1000 MG IV SOLR: INTRAVENOUS | Status: DC | PRN

## 2020-12-14 MED ORDER — VANCOMYCIN HCL 1500 MG/300ML IV SOLN
1500.0000 mg | Freq: Once | INTRAVENOUS | Status: AC
  Administered 2020-12-14: 1500 mg via INTRAVENOUS
  Filled 2020-12-14: qty 300

## 2020-12-14 MED ORDER — VANCOMYCIN HCL 1000 MG/200ML IV SOLN
1000.0000 mg | Freq: Two times a day (BID) | INTRAVENOUS | Status: AC
Start: 2020-12-15 — End: 2020-12-15
  Administered 2020-12-15: 1000 mg via INTRAVENOUS
  Filled 2020-12-14: qty 200

## 2020-12-14 MED ORDER — DEXAMETHASONE SODIUM PHOSPHATE 10 MG/ML IJ SOLN
INTRAMUSCULAR | Status: DC | PRN
  Administered 2020-12-14: 4 mg via INTRAVENOUS

## 2020-12-14 MED ORDER — OXYCODONE HCL 5 MG/5ML PO SOLN: 5.0000 mg | Freq: Once | ORAL | Status: DC | PRN

## 2020-12-14 MED ORDER — ACETAMINOPHEN 500 MG PO TABS
1000.0000 mg | ORAL_TABLET | Freq: Four times a day (QID) | ORAL | Status: DC
  Administered 2020-12-14 – 2020-12-15 (×3): 1000 mg via ORAL
  Filled 2020-12-14 (×3): qty 2

## 2020-12-14 MED ORDER — ONDANSETRON HCL 4 MG/2ML IJ SOLN
INTRAMUSCULAR | Status: DC | PRN
  Administered 2020-12-14: 4 mg via INTRAVENOUS

## 2020-12-14 MED ORDER — ACETAMINOPHEN 160 MG/5ML PO SOLN: 1000.0000 mg | Freq: Four times a day (QID) | ORAL | Status: DC

## 2020-12-14 MED ORDER — FENTANYL CITRATE (PF) 250 MCG/5ML IJ SOLN
INTRAMUSCULAR | Status: DC | PRN
  Administered 2020-12-14 (×2): 50 ug via INTRAVENOUS

## 2020-12-14 MED ORDER — HEPARIN SODIUM (PORCINE) 1000 UNIT/ML IJ SOLN
INTRAMUSCULAR | Status: AC
  Filled 2020-12-14: qty 1

## 2020-12-14 MED ORDER — GABAPENTIN 100 MG PO CAPS
100.0000 mg | ORAL_CAPSULE | Freq: Three times a day (TID) | ORAL | Status: DC
  Administered 2020-12-15 – 2020-12-22 (×20): 100 mg via ORAL
  Filled 2020-12-14 (×20): qty 1

## 2020-12-14 MED ORDER — INSULIN ASPART 100 UNIT/ML IJ SOLN
0.0000 [IU] | INTRAMUSCULAR | Status: DC
  Administered 2020-12-15: 4 [IU] via SUBCUTANEOUS
  Administered 2020-12-15: 2 [IU] via SUBCUTANEOUS
  Administered 2020-12-15 (×2): 4 [IU] via SUBCUTANEOUS
  Administered 2020-12-15: 8 [IU] via SUBCUTANEOUS

## 2020-12-14 MED ORDER — TRAMADOL HCL 50 MG PO TABS
50.0000 mg | ORAL_TABLET | Freq: Four times a day (QID) | ORAL | Status: DC | PRN
  Administered 2020-12-16: 50 mg via ORAL
  Administered 2020-12-25: 100 mg via ORAL
  Filled 2020-12-14: qty 1
  Filled 2020-12-14: qty 2

## 2020-12-14 MED ORDER — TORSEMIDE 40 MG PO TABS: 40.0000 mg | ORAL_TABLET | Freq: Every day | ORAL | Status: DC

## 2020-12-14 MED ORDER — LIDOCAINE 2% (20 MG/ML) 5 ML SYRINGE
INTRAMUSCULAR | Status: DC | PRN
  Administered 2020-12-14: 40 mg via INTRAVENOUS

## 2020-12-14 MED ORDER — GLIPIZIDE 10 MG PO TABS
10.0000 mg | ORAL_TABLET | Freq: Every day | ORAL | Status: DC
  Administered 2020-12-15 – 2020-12-27 (×10): 10 mg via ORAL
  Filled 2020-12-14 (×15): qty 1

## 2020-12-14 MED ORDER — OXYCODONE HCL 5 MG PO TABS: 5.0000 mg | ORAL_TABLET | Freq: Once | ORAL | Status: DC | PRN

## 2020-12-14 MED ORDER — SPIRONOLACTONE 25 MG PO TABS
25.0000 mg | ORAL_TABLET | Freq: Every day | ORAL | Status: DC
  Administered 2020-12-15 – 2020-12-28 (×15): 25 mg via ORAL
  Filled 2020-12-14 (×15): qty 1

## 2020-12-14 MED ORDER — ATORVASTATIN CALCIUM 80 MG PO TABS: 80.0000 mg | ORAL_TABLET | Freq: Every day | ORAL | Status: DC

## 2020-12-14 MED ORDER — ROCURONIUM BROMIDE 100 MG/10ML IV SOLN
INTRAVENOUS | Status: DC | PRN
  Administered 2020-12-14: 60 mg via INTRAVENOUS

## 2020-12-14 MED ORDER — ALBUTEROL SULFATE (2.5 MG/3ML) 0.083% IN NEBU: 3.0000 mL | INHALATION_SOLUTION | Freq: Four times a day (QID) | RESPIRATORY_TRACT | Status: DC | PRN

## 2020-12-14 MED ORDER — BISACODYL 5 MG PO TBEC
10.0000 mg | DELAYED_RELEASE_TABLET | Freq: Every day | ORAL | Status: DC
  Administered 2020-12-15 – 2020-12-28 (×8): 10 mg via ORAL
  Filled 2020-12-14 (×14): qty 2

## 2020-12-14 MED ORDER — ONDANSETRON HCL 4 MG/2ML IJ SOLN
4.0000 mg | Freq: Four times a day (QID) | INTRAMUSCULAR | Status: DC | PRN
  Administered 2020-12-23: 4 mg via INTRAVENOUS
  Filled 2020-12-14: qty 2

## 2020-12-14 MED ORDER — LACTATED RINGERS IV SOLN: INTRAVENOUS | Status: DC

## 2020-12-14 MED ORDER — SENNOSIDES-DOCUSATE SODIUM 8.6-50 MG PO TABS
1.0000 | ORAL_TABLET | Freq: Every day | ORAL | Status: DC
  Administered 2020-12-17 – 2020-12-27 (×8): 1 via ORAL
  Filled 2020-12-14 (×13): qty 1

## 2020-12-14 MED ORDER — DEXTROSE 50 % IV SOLN
INTRAVENOUS | Status: DC | PRN
  Administered 2020-12-14: 1 via INTRAVENOUS

## 2020-12-14 MED ORDER — CHLORHEXIDINE GLUCONATE 0.12 % MT SOLN
15.0000 mL | Freq: Once | OROMUCOSAL | Status: AC
  Administered 2020-12-14: 15 mL via OROMUCOSAL

## 2020-12-14 MED FILL — Torsemide Tab 20 MG: ORAL | 30 days supply | Qty: 60 | Fill #0 | Status: CN

## 2020-12-14 MED FILL — Clopidogrel Bisulfate Tab 75 MG (Base Equiv): ORAL | 30 days supply | Qty: 30 | Fill #0 | Status: CN

## 2020-12-14 MED FILL — Gabapentin Cap 100 MG: ORAL | 30 days supply | Qty: 90 | Fill #0 | Status: CN

## 2020-12-14 SURGICAL SUPPLY — 60 items
APL SKNCLS STERI-STRIP NONHPOA (GAUZE/BANDAGES/DRESSINGS)
ATTRACTOMAT 16X20 MAGNETIC DRP (DRAPES) ×2 IMPLANT
BAG DECANTER FOR FLEXI CONT (MISCELLANEOUS) ×2 IMPLANT
BAND INSRT 18 STRL LF DISP RB (MISCELLANEOUS)
BAND RUBBER #18 3X1/16 STRL (MISCELLANEOUS) IMPLANT
BENZOIN TINCTURE PRP APPL 2/3 (GAUZE/BANDAGES/DRESSINGS) IMPLANT
BLADE CLIPPER SURG (BLADE) ×2 IMPLANT
BLADE SURG 10 STRL SS (BLADE) ×4 IMPLANT
BNDG GAUZE ELAST 4 BULKY (GAUZE/BANDAGES/DRESSINGS) IMPLANT
CANISTER SUCT 3000ML PPV (MISCELLANEOUS) ×2 IMPLANT
CANISTER WOUNDNEG PRESSURE 500 (CANNISTER) ×1 IMPLANT
CATH THORACIC 28FR RT ANG (CATHETERS) IMPLANT
CATH THORACIC 36FR (CATHETERS) IMPLANT
CATH THORACIC 36FR RT ANG (CATHETERS) IMPLANT
CLIP VESOCCLUDE SM WIDE 24/CT (CLIP) IMPLANT
CNTNR URN SCR LID CUP LEK RST (MISCELLANEOUS) IMPLANT
CONN Y 3/8X3/8X3/8  BEN (MISCELLANEOUS) ×2
CONN Y 3/8X3/8X3/8 BEN (MISCELLANEOUS) ×1 IMPLANT
CONT SPEC 4OZ STRL OR WHT (MISCELLANEOUS)
DRAPE LAPAROSCOPIC ABDOMINAL (DRAPES) ×2 IMPLANT
DRSG PAD ABDOMINAL 8X10 ST (GAUZE/BANDAGES/DRESSINGS) ×6 IMPLANT
DRSG VAC ATS LRG SENSATRAC (GAUZE/BANDAGES/DRESSINGS) ×1 IMPLANT
DRSG VAC ATS MED SENSATRAC (GAUZE/BANDAGES/DRESSINGS) ×1 IMPLANT
DRSG VERAFLOW VAC LRG (GAUZE/BANDAGES/DRESSINGS) ×1 IMPLANT
ELECT REM PT RETURN 9FT ADLT (ELECTROSURGICAL) ×2
ELECTRODE REM PT RTRN 9FT ADLT (ELECTROSURGICAL) ×1 IMPLANT
GAUZE SPONGE 4X4 12PLY STRL (GAUZE/BANDAGES/DRESSINGS) ×2 IMPLANT
GAUZE XEROFORM 5X9 LF (GAUZE/BANDAGES/DRESSINGS) IMPLANT
GOWN STRL REUS W/ TWL LRG LVL3 (GOWN DISPOSABLE) ×4 IMPLANT
GOWN STRL REUS W/TWL LRG LVL3 (GOWN DISPOSABLE) ×8
HANDPIECE INTERPULSE COAX TIP (DISPOSABLE)
HEMOSTAT POWDER SURGIFOAM 1G (HEMOSTASIS) IMPLANT
HEMOSTAT SURGICEL 2X14 (HEMOSTASIS) ×2 IMPLANT
KIT BASIN OR (CUSTOM PROCEDURE TRAY) ×2 IMPLANT
KIT SUCTION CATH 14FR (SUCTIONS) IMPLANT
KIT TURNOVER KIT B (KITS) ×2 IMPLANT
NS IRRIG 1000ML POUR BTL (IV SOLUTION) ×2 IMPLANT
PACK CHEST (CUSTOM PROCEDURE TRAY) ×2 IMPLANT
PAD ARMBOARD 7.5X6 YLW CONV (MISCELLANEOUS) ×4 IMPLANT
PIN SAFETY STERILE (MISCELLANEOUS) IMPLANT
SET HNDPC FAN SPRY TIP SCT (DISPOSABLE) IMPLANT
SOL PREP POV-IOD 4OZ 10% (MISCELLANEOUS) IMPLANT
SPONGE LAP 18X18 RF (DISPOSABLE) ×2 IMPLANT
STAPLER VISISTAT 35W (STAPLE) IMPLANT
SUT ETHILON 3 0 FSL (SUTURE) IMPLANT
SUT MNCRL AB 4-0 PS2 18 (SUTURE) IMPLANT
SUT PDS AB 1 CTX 36 (SUTURE) ×4 IMPLANT
SUT STEEL 6MS V (SUTURE) IMPLANT
SUT STEEL STERNAL CCS#1 18IN (SUTURE) IMPLANT
SUT STEEL SZ 6 DBL 3X14 BALL (SUTURE) IMPLANT
SUT VIC AB 1 CTX 36 (SUTURE)
SUT VIC AB 1 CTX36XBRD ANBCTR (SUTURE) IMPLANT
SUT VIC AB 2-0 CTX 27 (SUTURE) IMPLANT
SUT VIC AB 3-0 X1 27 (SUTURE) IMPLANT
SWAB COLLECTION DEVICE MRSA (MISCELLANEOUS) IMPLANT
SWAB CULTURE ESWAB REG 1ML (MISCELLANEOUS) IMPLANT
SYSTEM SAHARA CHEST DRAIN ATS (WOUND CARE) ×2 IMPLANT
TOWEL GREEN STERILE (TOWEL DISPOSABLE) ×2 IMPLANT
TOWEL GREEN STERILE FF (TOWEL DISPOSABLE) ×2 IMPLANT
WATER STERILE IRR 1000ML POUR (IV SOLUTION) ×2 IMPLANT

## 2020-12-14 NOTE — Progress Notes (Signed)
Admission from PACU by stretcher awake and alert .

## 2020-12-14 NOTE — H&P (Addendum)
[address]       [place] [address]             [phone]          CARDIOTHORACIC SURGERY HISTORY AND PHYSICAL EXAM  PCP is Azzie Glatter, FNP (Inactive) Primary Cardiologist is Quay Burow, MD  Chief Complaint:  Sternal wound drainage  HPI:  Patient is a 45 year old morbidly obese African-American female with history of poorly controlled diabetes mellitus, hypertension, hyperlipidemia, and coronary artery disease who underwent coronary artery bypass grafting by Dr. Orvan Seen on October 09, 2020.  The patient developed superficial sternal wound dehiscence for which she has been followed intermittently in the office.  She called today because of increased drainage from her sternal wound.  She has spoken over the telephone with Ellwood Handler, PA-C and other members of our office staff who after reviewing photograph of her wound recommended that the patient present directly to the hospital for examination possible wound debridement.  Patient denies any fevers or chills.  She reports some pressure across her chest.  She denies any sensation of clicking or motion of the sternum.  Past Medical History:  Diagnosis Date  . Coronary artery disease   . CVA (cerebral vascular accident) ([place] [facility]) 09/2020  . Diabetes mellitus without complication ([place])   . Diabetic neuropathy ([facility]) 02/2020  . 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  . Hyperlipidemia age 29  . Hypertension age 1  . Hypocalcemia 11/2019  . Hypokalemia 11/2019  . Iron deficiency anemia   . NSTEMI (non-ST elevated myocardial infarction) ([place] [place]) 09/2020  . Obesity   . Proteinuria 12/09/2019  . Pulmonary embolism ([place]) 09/2020    Past Surgical History:  Procedure Laterality Date  . CARDIAC CATHETERIZATION  09/25/2020  . CESAREAN SECTION  3/96  . CLIPPING OF ATRIAL APPENDAGE N/A 10/09/2020   Procedure: CLIPPING OF ATRIAL APPENDAGE USING ATRICURE 35MM CLIP;  Surgeon: [name], MD;  Location: [facility];  Service: Open Heart  Surgery;  Laterality: N/A;  . CORONARY ARTERY BYPASS GRAFT N/A 10/09/2020   Procedure: CORONARY ARTERY BYPASS GRAFTING (CABG), ON PUMP, TIMES FOUR, USING LEFT INTERNAL MAMMARY ARTERY AND LEFT RADIAL ARTERY (OPEN HARVEST);  Surgeon: [name], MD;  Location: [facility];  Service: Open Heart Surgery;  Laterality: N/A;  POSSIBLE BIMA  . IR THORACENTESIS ASP PLEURAL SPACE W/IMG GUIDE  10/16/2020  . LEFT HEART CATH AND CORONARY ANGIOGRAPHY N/A 09/25/2020   Procedure: LEFT HEART CATH AND CORONARY ANGIOGRAPHY;  Surgeon: Leonie Man, MD;  Location: [facility];  Service: Cardiovascular;  Laterality: N/A;  . ORIF FEMUR FRACTURE    . RADIAL ARTERY HARVEST Left 10/09/2020   Procedure: RADIAL ARTERY HARVEST;  Surgeon: [name], MD;  Location: [facility];  Service: Open Heart Surgery;  Laterality: Left;  . TEE WITHOUT CARDIOVERSION N/A 10/09/2020   Procedure: TRANSESOPHAGEAL ECHOCARDIOGRAM (TEE);  Surgeon: [name], MD;  Location: [facility];  Service: Open Heart Surgery;  Laterality: N/A;    Family History  Problem Relation Age of Onset  . Heart disease Mother        CABG <50  . Hypertension Mother   . Diabetes Mother   . Hyperlipidemia Mother   . Heart disease Father   . Hyperlipidemia Father   . Hypertension Father   . Early death Father   . Healthy Daughter   . Diabetes Maternal Aunt   . Cancer Maternal Grandmother  stomach cancer  . Diabetes Maternal Aunt   . Cancer Cousin        colon cancer (dx'd 48)    Social History   Socioeconomic History  . Marital status: Single    Spouse name: Not on file  . Number of children: Not on file  . Years of education: Not on file  . Highest education level: Not on file  Occupational History  . Occupation: Event organiser  Tobacco Use  . Smoking status: Never Smoker  . Smokeless tobacco: Never Used  Vaping Use  . Vaping Use: Never used  Substance and Sexual Activity  . Alcohol use: No  . Drug use: No  . Sexual activity:  Not Currently    Partners: Male    Comment: husband currently in [place], returning 02/2013  Other Topics Concern  . 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  Lives with 68 year old daughter, 1 cat Husband is living in [place] (citizen there), trying to come to [place] (has been delayed)   Social Determinants of Radio broadcast assistant Strain: High Risk  . Difficulty of Paying Living Expenses: Hard  Food Insecurity: Food Insecurity Present  . Worried About Charity fundraiser in the Last Year: Sometimes true  . Ran Out of Food in the Last Year: Sometimes true  Transportation Needs: No Transportation Needs  . Lack of Transportation (Medical): No  . Lack of Transportation (Non-Medical): No  Physical Activity: Not on file  Stress: Stress Concern Present  . Feeling of Stress : Rather much  Social Connections: Not on file  Intimate Partner Violence: Not on file    Prior to Admission medications   Medication Sig Start Date End Date Taking? Authorizing Provider  acetaminophen (TYLENOL) 325 MG tablet Take 2 tablets (650 mg total) by mouth every 6 (six) hours as needed for mild pain or headache. 10/26/20   Barrett, Erin R, PA-C  albuterol (PROVENTIL HFA) 108 (90 Base) MCG/ACT inhaler Inhale 2 puffs into the lungs every 6 (six) hours as needed for wheezing. 09/12/20   Azzie Glatter, FNP  albuterol (VENTOLIN HFA) 108 (90 Base) MCG/ACT inhaler INHALE 2 PUFFS INTO THE LUNGS EVERY 6 (SIX) HOURS AS NEEDED FOR WHEEZING. 09/12/20 09/12/21  Azzie Glatter, FNP  aspirin EC 81 MG tablet Take 81 mg by mouth daily.    [provider]  atorvastatin (LIPITOR) 80 MG tablet Take 1 tablet (80 mg total) by mouth daily. 09/27/20 10/27/20  Cheryln Manly, NP  atorvastatin (LIPITOR) 80 MG tablet TAKE 1 TABLET (80 MG TOTAL) BY MOUTH DAILY. 11/27/20 11/27/21  Azzie Glatter, FNP  carvedilol (COREG) 12.5 MG tablet TAKE 1 TABLET (12.5 MG  TOTAL) BY MOUTH TWO TIMES DAILY WITH A MEAL. 10/19/20 10/19/21  Barrett, Erin R, PA-C  carvedilol (COREG) 6.25 MG tablet Take 1 tablet (6.25 mg total) by mouth 2 (two) times daily with a meal. 11/27/20   Azzie Glatter, FNP  clopidogrel (PLAVIX) 75 MG tablet Take 1 tablet (75 mg total) by mouth daily. 11/27/20   Azzie Glatter, FNP  clopidogrel (PLAVIX) 75 MG tablet TAKE 1 TABLET (75 MG TOTAL) BY MOUTH DAILY. 10/19/20 10/19/21  Barrett, Erin R, PA-C  dapagliflozin propanediol (FARXIGA) 10 MG TABS tablet Take 1 tablet (10 mg total) by mouth daily. 10/19/20   Barrett, Erin R, PA-C  dapagliflozin propanediol (FARXIGA) 10 MG TABS tablet  TAKE 1 TABLET (10 MG TOTAL) BY MOUTH DAILY. 10/19/20 10/19/21  Barrett, Erin R, PA-C  furosemide (LASIX) 40 MG tablet TAKE 1 TABLET (40 MG TOTAL) BY MOUTH DAILY. 10/19/20 10/19/21  Barrett, Erin R, PA-C  gabapentin (NEURONTIN) 100 MG capsule Take 1 capsule (100 mg total) by mouth 3 (three) times daily. 11/27/20   Azzie Glatter, FNP  gabapentin (NEURONTIN) 100 MG capsule TAKE 1 CAPSULE (100 MG TOTAL) BY MOUTH 3 (THREE) TIMES DAILY. 08/21/20 08/21/21  Azzie Glatter, FNP  glipiZIDE (GLUCOTROL) 10 MG tablet TAKE 1 TABLET BY MOUTH TWICE DAILY BEFORE A MEAL 11/27/20   Azzie Glatter, FNP  insulin glargine (LANTUS SOLOSTAR) 100 UNIT/ML Solostar Pen Inject 8 Units into the skin 2 (two) times daily. 10/26/20   Barrett, Erin R, PA-C  Insulin Pen Needle 29G X 5MM MISC Use with insulin 08/21/20   Azzie Glatter, FNP  isosorbide-hydrALAZINE (BIDIL) 20-37.5 MG tablet Take 0.5 tablets by mouth 3 (three) times daily. 11/08/20   Simmons, Brittainy M, PA-C  isosorbide-hydrALAZINE (BIDIL) 20-37.5 MG tablet TAKE 1 TABLET BY MOUTH 3 (THREE) TIMES DAILY. 10/19/20 10/19/21  [name], MD  isosorbide-hydrALAZINE (BIDIL) 20-37.5 MG tablet TAKE 1 TABLET BY MOUTH THREE TIMES DAILY. 10/19/20 10/19/21  Barrett, Erin R, PA-C  metoprolol tartrate (LOPRESSOR) 50 MG tablet TAKE 1 TABLET (50 MG  TOTAL) BY MOUTH DAILY. 09/27/20 09/27/21  Cheryln Manly, NP  Multiple Vitamin (MULTIVITAMIN WITH MINERALS) TABS tablet Take 1 tablet by mouth daily. 10/26/20   Barrett, Erin R, PA-C  nitroGLYCERIN (NITROSTAT) 0.4 MG SL tablet DISSOLVE 1 TABLET UNDER THE TONGUE EVERY 5 MINUTES FOR 3 DOSES AS NEEDED FOR CHEST PAIN 10/08/20   Reino Bellis B, NP  nitroGLYCERIN (NITROSTAT) 0.4 MG SL tablet PLACE 1 TABLET (0.4 MG TOTAL) UNDER THE TONGUE EVERY FIVE MINUTES AS NEEDED. 09/27/20 09/27/21  Reino Bellis B, NP  oxyCODONE (OXY IR/ROXICODONE) 5 MG immediate release tablet Take 1 tablet (5 mg total) by mouth every 4 (four) hours as needed for severe pain. 10/19/20   Barrett, Erin R, PA-C  oxyCODONE (OXY IR/ROXICODONE) 5 MG immediate release tablet TAKE 1 TABLET (5 MG TOTAL) BY MOUTH EVERY FOUR HOURS AS NEEDED FOR SEVERE PAIN. 10/19/20 04/17/21  Barrett, Erin R, PA-C  sacubitril-valsartan (ENTRESTO) 49-51 MG TAKE 1 TABLET BY MOUTH TWO TIMES DAILY. 10/19/20 10/19/21  Barrett, Erin R, PA-C  sacubitril-valsartan (ENTRESTO) 97-103 MG Take 1 tablet by mouth 2 (two) times daily. 11/08/20   Lyda Jester M, PA-C  silver sulfADIAZINE (SILVADENE) 1 % cream Apply to affected area daily 11/15/20 11/15/21  [name], MD  spironolactone (ALDACTONE) 25 MG tablet Take 1 tablet (25 mg total) by mouth daily. 11/27/20   Azzie Glatter, FNP  spironolactone (ALDACTONE) 25 MG tablet TAKE 1 TABLET (25 MG TOTAL) BY MOUTH DAILY. 10/19/20 10/19/21  Barrett, Erin R, PA-C  torsemide (DEMADEX) 20 MG tablet TAKE 2 TABLETS (40MG  TOTAL) BY MOUTH ONCE A DAY 10/26/20 10/26/21  Barrett, Erin R, PA-C  torsemide 40 MG TABS Take 40 mg by mouth daily. 10/26/20   Barrett, Lodema Hong, PA-C    Current Facility-Administered Medications  Medication Dose Route Frequency Provider Last Rate Last Admin  . chlorhexidine (PERIDEX) 0.12 % solution           . lactated ringers infusion   Intravenous Continuous Roberts Gaudy, MD 10 mL/hr at 12/14/20 1534 New Bag  at 12/14/20 1534    Allergies  Allergen Reactions  . Penicillins Rash  Historical Has patient had a PCN reaction causing immediate rash, facial/tongue/throat swelling, SOB or lightheadedness with hypotension: No Has patient had a PCN reaction causing severe rash involving mucus membranes or skin necrosis: No Has patient had a PCN reaction that required hospitalization: No Has patient had a PCN reaction occurring within the last 10 years: No If all of the above answers are "NO", then may proceed with Cephalosporin use.      Review of Systems:  Per HPI    Physical Exam:   BP (!) 193/108   Pulse (!) 101   Temp 98.9 F (37.2 C) (Oral)   Resp 20   Ht 5\' 4"  (1.626 m)   Wt 107 kg   SpO2 100%   BMI 40.51 kg/m   General:  Morbidly obese female NAD  HEENT:  Unremarkable   Neck:   no JVD, no bruits, no adenopathy   Chest:   clear to auscultation, symmetrical breath sounds, no wheezes, no rhonchi   Incision:  Dehiscence of majority of the superficial aspect of the sternal wound with liquefied fat necrosis and copious amount of necrotic tissue in the subcutaneous tissues.  There is small amount of serosanguineous drainage.  There is no clicking or sensation of motion of the sternum.  There is no excessive amount of pain with palpation of the sternum.  CV:   RRR, no murmur   Abdomen:  soft, non-tender, no masses   Extremities:  warm, well-perfused, pulses not palpable, no lower extremity edema  Rectal/GU  Deferred  Neuro:   Grossly non-focal and symmetrical throughout  Skin:   Clean and dry, no rashes, no breakdown  Diagnostic Tests:  Lab Results: No results for input(s): WBC, HGB, HCT, PLT in the last 72 hours. BMET: No results for input(s): NA, K, CL, CO2, GLUCOSE, BUN, CREATININE, CALCIUM in the last 72 hours.  CBG (last 3)  Recent Labs    12/14/20 1441  GLUCAP 70   PT/INR:  No results for input(s): LABPROT, INR in the last 72 hours.  CXR:  N/A    Impression:  I  have personally examined the patient and agree with the admission history and physical examination as documented by Ellwood Handler, PA-C.  Patient has extensive dehiscence of her sternal wound with a great deal of necrotic tissue including liquefied fat necrosis that needs excisional debridement.  There is no obvious evidence of deep sternal infection or mediastinitis at this time.   Plan:  Check blood cultures and proceed directly to the operating room for excisional debridement of the superficial aspect of the sternal wound.  Patient will likely benefit from negative pressure wound therapy and intravenous antibiotics.   I am hopeful that the infection does not extend into the mediastinum and that sternal wire removal will not be necessary.  I discussed the indications, risk, and potential benefits of wound debridement with the patient at length.  Alternative treatment strategies have been discussed.  All of her questions been answered.  We plan to proceed directly to the operating room soon as practical.     Valentina Gu. Roxy Manns, MD 12/14/2020 3:53 PM

## 2020-12-14 NOTE — Anesthesia Postprocedure Evaluation (Signed)
Anesthesia Post Note  Patient: Tiffany Le  Procedure(s) Performed: STERNAL WOUND DEBRIDEMENT (N/A ) APPLICATION OF WOUND VAC (N/A )     Patient location during evaluation: PACU Anesthesia Type: General Level of consciousness: awake and alert Pain management: pain level controlled Vital Signs Assessment: post-procedure vital signs reviewed and stable Respiratory status: spontaneous breathing, nonlabored ventilation, respiratory function stable and patient connected to nasal cannula oxygen Cardiovascular status: blood pressure returned to baseline and stable Postop Assessment: no apparent nausea or vomiting Anesthetic complications: no   No complications documented.  Last Vitals:  Vitals:   12/14/20 1800 12/14/20 1845  BP: 133/86 133/84  Pulse: 82 84  Resp: 14 12  Temp: (!) 36.1 C 37.1 C  SpO2: 99% 98%    Last Pain:  Vitals:   12/14/20 1845  TempSrc: Oral  PainSc:                  Naomia Lenderman COKER

## 2020-12-14 NOTE — Hospital Course (Addendum)
History of Present Illness:  Tiffany Le is a 45 yo obese AA female well known to [facility].  She is S/P is CABG x 4 performed by Dr. Orvan Seen at 10/10/2020.  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  The patient contacted our office this morning (5/6) with complaints of poorly healing sternal wound.  She states that she had been doing local wound care, but unfortunately her wound had gotten progressively worse.  The patient denied fever, warmth and reddness.  She did mention the wound had foul smelling drainage present.  She sent a picture to our office nurse which was reviewed by the PA on call.  It was felt the patient would require admission and OR debridement.  Hospital Course:  Patient was taken to the operating room by Dr. Roxy Manns.  He performed excisional debridement of the sternal wound with application of a wound vac.  She tolerated the procedure without difficulty, was extubated, and taken to the PACU in stable condition.  The patients OR culture grew Serratia Marcescens.  She was started on IV Rocephin for this.  She was restarted on her home regimen of heart failure medications and diuretics.  The patient developed nausea and vomiting.  She was treated with anti-emetics and responded well to this.  She was taken back to the OR for wound vac change on 12/18/2020.  She was then again taken to the OR after being seen by plastics on 12/20/2000 and Dr. Oletha Blend placed a cell, VAC and ABRA.  The wound has continued to show good healing and the patient was returned to the OR on 12/25/2020 by Dr. Orvan Seen where he was closed and a Prevena wound dressing device was placed over it.  She is completed a 10-day course of meropenem.  Patient remains medically stable and on 12/28/2020 the Praveena was removed by Dr. Orvan Seen and an aqua seal dressing was placed.  Plans are for discharge on today's date and early follow-up next week.  The wound JP drain has been  removed.

## 2020-12-14 NOTE — Op Note (Signed)
CARDIOTHORACIC SURGERY OPERATIVE NOTE  Date of Procedure:   12/14/2020  Preoperative Diagnosis:  Sternal Wound Dehiscence  Postoperative Diagnosis:  same  Procedure:      Excisional Debridement of Sternal Wound  Application of Negative Pressure Wound Vacuum Assisted Closure Device  Surgeon:    Valentina Gu. Roxy Manns, MD  Anesthesia:    General  Operative Findings:   Extensive liquified necrosis of subcutaneous fat extending to anterior table of sternum  No sign of sternal wire dehiscence  No obvious signs of deep sternal wound infection     BRIEF CLINICAL NOTE AND INDICATIONS FOR SURGERY  Patient is a 45 year old morbidly obese African-American female with history of poorly controlled diabetes mellitus, hypertension, hyperlipidemia, and coronary artery disease who underwent coronary artery bypass grafting by Dr. Orvan Seen on October 09, 2020.  The patient developed superficial sternal wound dehiscence for which she has been followed intermittently in the office.  She called today because of increased drainage from her sternal wound.  She has spoken over the telephone with Ellwood Handler, PA-C and other members of our office staff who after reviewing photograph of her wound recommended that the patient present directly to the hospital for examination possible wound debridement.  Patient denies any fevers or chills.  She reports some pressure across her chest.  She denies any sensation of clicking or motion of the sternum.  The patient has been seen in consultation and counseled at length regarding the indications, risks and potential benefits of surgery.  All questions have been answered, and the patient provides full informed consent for the operation as described.      DETAILS OF THE OPERATIVE PROCEDURE  The patient is brought to the operating room on the above mentioned date and placed in the supine position on the operating table.  General endotracheal anesthesia is induced uneventfully  under the care of direction of Dr. Roberts Gaudy.  Intravenous antibiotics were administered.  A timeout procedure was performed.  The patient's existing dressing over the sternal wound is removed.  The entire anterior chest is patent with Betadine solution and draped in a sterile fashion.  Wound swab cultures were obtained from the obviously liquefied necrosis of the deep subcutaneous tissues.  Excisional sharp debridement is performed to eliminate all of the necrotic and devitalized tissues.  The wound extends down to the anterior table of the sternum inferiorly.  There is no sign of sternal wire dehiscence through the bone.  There is no obvious evidence of deep sternal wound infection.  After all of the devitalized tissue has been sharply excised, the wound is irrigated with copious saline solution with a pulse lavage irrigation device.  Final wound dimensions measure 13 cm long by 7.5 cm wide by 3.5 cm deep.  Wound vacuum-assisted closure device is applied and negative pressure wound therapy initiated.  Patient tolerated the procedure well, extubated in the operating room, and transported to recovery room in stable condition.  There were no intraoperative complications.  Estimated blood loss was trivial.     Valentina Gu. Roxy Manns MD 12/14/2020 4:58 PM

## 2020-12-14 NOTE — Telephone Encounter (Signed)
Contacted Tiffany Le, per Dr. Roxy Manns, patient to be admitted directly to Short Stay holding for sternal wound debridement. Patient states she has not had anything to eat or drink today, advised patient to not eat or drink anything in preparation of surgery. Patient aware and verbalizes understanding of going straight to [facility] Short Stay department. Lindsi with Short Stay made aware. Direct Admit request canceled. No further questions from Tiffany Le at this time.

## 2020-12-14 NOTE — Telephone Encounter (Signed)
Ms. Birnie called stating she has been doing local wound care for sternal incision, s/p CABG 3/1 by Dr. Orvan Seen. Per patient she has been using a topical cream as prescribed by Dr. Orvan Seen to apply to sternal wound. Patient states she has been using the cream, however, wound has progressively gotten worse. Patient denies fever, redness, or warmth to wound. Patient states wound is draining pus and has a foul odor. Photo sent to office cell phone for observation. Photo shown to E. Barrett, PA. Per PA, a bed request was made for direct admission to [facility]. Patient in agreement with plan and is aware the hospital will notify her when a bed becomes available.

## 2020-12-14 NOTE — Anesthesia Preprocedure Evaluation (Signed)
Anesthesia Evaluation  Patient identified by MRN, date of birth, ID band Patient awake    Reviewed: Allergy & Precautions, NPO status , Patient's Chart, lab work & pertinent test results  Airway Mallampati: II  TM Distance: >3 FB Neck ROM: Full    Dental  (+) Teeth Intact, Dental Advisory Given   Pulmonary    breath sounds clear to auscultation       Cardiovascular hypertension,  Rhythm:Regular Rate:Normal  Sternal wound tender, erythematous   Neuro/Psych    GI/Hepatic   Endo/Other  diabetes  Renal/GU      Musculoskeletal   Abdominal   Peds  Hematology   Anesthesia Other Findings   Reproductive/Obstetrics                             Anesthesia Physical Anesthesia Plan  ASA: III  Anesthesia Plan: General   Post-op Pain Management:    Induction: Intravenous  PONV Risk Score and Plan: Ondansetron and Dexamethasone  Airway Management Planned: Oral ETT  Additional Equipment:   Intra-op Plan:   Post-operative Plan: Extubation in OR  Informed Consent: I have reviewed the patients History and Physical, chart, labs and discussed the procedure including the risks, benefits and alternatives for the proposed anesthesia with the patient or authorized representative who has indicated his/her understanding and acceptance.     Dental advisory given  Plan Discussed with: CRNA and Anesthesiologist  Anesthesia Plan Comments:         Anesthesia Quick Evaluation

## 2020-12-14 NOTE — Anesthesia Procedure Notes (Signed)
Procedure Name: Intubation Date/Time: 12/14/2020 4:20 PM Performed by: Janene Harvey, CRNA Pre-anesthesia Checklist: Patient identified, Emergency Drugs available, Suction available and Patient being monitored Patient Re-evaluated:Patient Re-evaluated prior to induction Oxygen Delivery Method: Circle system utilized Preoxygenation: Pre-oxygenation with 100% oxygen Induction Type: IV induction Ventilation: Mask ventilation without difficulty Laryngoscope Size: Mac and 3 Grade View: Grade I Tube type: Oral Tube size: 7.0 mm Number of attempts: 1 Airway Equipment and Method: Stylet and Oral airway Placement Confirmation: ETT inserted through vocal cords under direct vision,  positive ETCO2 and breath sounds checked- equal and bilateral Secured at: 22 cm Tube secured with: Tape Dental Injury: Teeth and Oropharynx as per pre-operative assessment  Comments: Inserted by Paulina Fusi, SRNA

## 2020-12-14 NOTE — Transfer of Care (Signed)
Immediate Anesthesia Transfer of Care Note  Patient: Tiffany Le  Procedure(s) Performed: STERNAL WOUND DEBRIDEMENT (N/A ) APPLICATION OF WOUND VAC (N/A )  Patient Location: PACU  Anesthesia Type:General  Level of Consciousness: drowsy and patient cooperative  Airway & Oxygen Therapy: Patient Spontanous Breathing and Patient connected to face mask oxygen  Post-op Assessment: Report given to RN and Post -op Vital signs reviewed and stable  Post vital signs: Reviewed  Last Vitals:  Vitals Value Taken Time  BP 160/99 12/14/20 1705  Temp    Pulse 91 12/14/20 1711  Resp 8 12/14/20 1711  SpO2 100 % 12/14/20 1711  Vitals shown include unvalidated device data.  Last Pain:  Vitals:   12/14/20 1514  TempSrc:   PainSc: 0-No pain      Patients Stated Pain Goal: 3 (44/01/02 7253)  Complications: No complications documented.

## 2020-12-14 NOTE — Progress Notes (Signed)
[facility] BRIEF PROGRESS NOTE  Day of Surgery  S/P Procedure(s) (LRB): STERNAL WOUND DEBRIDEMENT (N/A) APPLICATION OF WOUND VAC (N/A)   Doing very well in PACU.  Denies any pain or SOB Wound VAC dressing intact  Plan: [facility].  Mobilize.  Advance diet.  Rexene Alberts, MD 12/14/2020 6:05 PM

## 2020-12-14 NOTE — H&P (Signed)
[address]       ,[place] [address]             [phone]     Subjective:   Tiffany Le is a 45 yo obese AA female well known to [facility]CTS.  She is S/P is CABG x 4 performed by Dr. Orvan Seen at 10/10/2020.  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  The patient contacted our office this morning with complaints of poorly healing sternal wound.  She states that she had been doing local wound care, but unfortunately her wound had gotten progressively worse.  The patient denied fever, warmth and reddness.  She did mention the wound had foul smelling drainage present.  She sent a picture to our office nurse which was reviewed by the PA on call.  It was felt the patient would require admission and OR debridement.  She was contacted and set up for this to be performed today.  Patient Active Problem List   Diagnosis Date Noted  . Sternal wound dehiscence 12/14/2020  . 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 ([place])   . 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  . Pulmonary edema 10/02/2020  . Acute respiratory failure with hypoxia ([place])   . Coronary artery disease involving native coronary artery of native heart with unstable angina pectoris ([place])   . Acute on chronic combined systolic (congestive) and diastolic (congestive) heart failure ([place])   . NSTEMI (non-ST elevated myocardial infarction) ([place]) 09/25/2020  . Uncontrolled diabetes mellitus with complication, without long-term current use of insulin ([place])   . Shortness of breath 12/25/2019  . Hemoglobin A1C greater than 9%, indicating poor diabetic control 12/25/2019  . Sepsis due to urinary tract infection ([place]) 12/03/2019  . Sepsis secondary to UTI ([place]) 12/02/2019  . Class 3 severe obesity due to excess calories with serious comorbidity and body mass index (BMI) of 40.0 to 44.9 in adult ([place]) 10/24/2018  . Back pain 10/24/2018  . Elevated  LFTs 01/13/2014  . Unspecified vitamin D deficiency 01/13/2014  . Anemia, iron deficiency 11/17/2012  . Essential hypertension, benign 11/17/2012  . Pure hypercholesterolemia 11/17/2012   Past Medical History:  Diagnosis Date  . Coronary artery disease   . CVA (cerebral vascular accident) ([place]) 09/2020  . Diabetes mellitus without complication ([place])   . Diabetic neuropathy ([place]) 02/2020  . 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  . Hyperlipidemia age 33  . Hypertension age 56  . Hypocalcemia 11/2019  . Hypokalemia 11/2019  . Iron deficiency anemia   . NSTEMI (non-ST elevated myocardial infarction) ([place]) 09/2020  . Obesity   . Proteinuria 12/09/2019  . Pulmonary embolism ([place]) 09/2020    Past Surgical History:  Procedure Laterality Date  . CARDIAC CATHETERIZATION  09/25/2020  . CESAREAN SECTION  3/96  . CLIPPING OF ATRIAL APPENDAGE N/A 10/09/2020   Procedure: CLIPPING OF ATRIAL APPENDAGE USING ATRICURE 35MM CLIP;  Surgeon: [name], MD;  Location: [facility];  Service: Open Heart Surgery;  Laterality: N/A;  . CORONARY ARTERY BYPASS GRAFT N/A 10/09/2020   Procedure: CORONARY ARTERY BYPASS GRAFTING (CABG), ON PUMP, TIMES FOUR, USING LEFT INTERNAL MAMMARY ARTERY AND LEFT RADIAL ARTERY (OPEN HARVEST);  Surgeon: [name], MD;  Location: [facility];  Service: Open Heart Surgery;  Laterality: N/A;  POSSIBLE BIMA  . IR THORACENTESIS ASP PLEURAL SPACE W/IMG GUIDE  10/16/2020  . LEFT HEART CATH AND CORONARY ANGIOGRAPHY N/A  09/25/2020   Procedure: LEFT HEART CATH AND CORONARY ANGIOGRAPHY;  Surgeon: Leonie Man, MD;  Location: [facility];  Service: Cardiovascular;  Laterality: N/A;  . ORIF FEMUR FRACTURE    . RADIAL ARTERY HARVEST Left 10/09/2020   Procedure: RADIAL ARTERY HARVEST;  Surgeon: [name], MD;  Location: [facility];  Service: Open Heart Surgery;  Laterality: Left;  . TEE WITHOUT CARDIOVERSION N/A 10/09/2020   Procedure: TRANSESOPHAGEAL ECHOCARDIOGRAM (TEE);  Surgeon: [name],  MD;  Location: [facility];  Service: Open Heart Surgery;  Laterality: N/A;    No medications prior to admission.   Allergies  Allergen Reactions  . Penicillins Rash    Historical Has patient had a PCN reaction causing immediate rash, facial/tongue/throat swelling, SOB or lightheadedness with hypotension: No Has patient had a PCN reaction causing severe rash involving mucus membranes or skin necrosis: No Has patient had a PCN reaction that required hospitalization: No Has patient had a PCN reaction occurring within the last 10 years: No If all of the above answers are "NO", then may proceed with Cephalosporin use.    Social History   Tobacco Use  . Smoking status: Never Smoker  . Smokeless tobacco: Never Used  Substance Use Topics  . Alcohol use: No    Family History  Problem Relation Age of Onset  . Heart disease Mother        CABG <50  . Hypertension Mother   . Diabetes Mother   . Hyperlipidemia Mother   . Heart disease Father   . Hyperlipidemia Father   . Hypertension Father   . Early death Father   . Healthy Daughter   . Diabetes Maternal Aunt   . Cancer Maternal Grandmother        stomach cancer  . Diabetes Maternal Aunt   . Cancer Cousin        colon cancer (dx'd 21)    Review of Systems  Pertinent items are noted in HPI.  Objective:    Performed by Dr. Roxy Manns:  BP (!) 193/108   Pulse (!) 101   Temp 98.9 F (37.2 C) (Oral)   Resp 20   Ht 5\' 4"  (1.626 m)   Wt 107 kg   SpO2 100%   BMI 40.51 kg/m              General:                      Morbidly obese female NAD             HEENT:                       Unremarkable              Neck:                           no JVD, no bruits, no adenopathy              Chest:                          clear to auscultation, symmetrical breath sounds, no wheezes, no rhonchi              Incision:                       Dehiscence of  majority of the superficial aspect of the sternal wound with liquefied fat necrosis and  copious amount of necrotic tissue in the subcutaneous tissues.  There is small amount of serosanguineous drainage.  There is no clicking or sensation of motion of the sternum.  There is no excessive amount of pain with palpation of the sternum.             CV:                              RRR, no murmur              Abdomen:                    soft, non-tender, no masses              Extremities:                 warm, well-perfused, pulses not palpable, no lower extremity edema             Rectal/GU                   Deferred             Neuro:                         Grossly non-focal and symmetrical throughout             Skin:                            Clean and dry, no rashes, no breakdown    Assessment:   Active Problems:   Sternal wound dehiscence   Plan:   1. Sternal wound dehiscence- wound is approximately 6-8 in in length with yellow sloth and drainage present, that is foul smelling 2. DM- poorly controlled 3. HTN 4. Hyperlipidemia 5. Dispo- patient will require admission, she will be taken directly to the OR for sternal wound debridement, culture, and placement of wound vac.  Will obtain blood cultures.

## 2020-12-15 ENCOUNTER — Encounter (HOSPITAL_COMMUNITY): Payer: Self-pay | Admitting: Thoracic Surgery (Cardiothoracic Vascular Surgery)

## 2020-12-15 LAB — CBC
HCT: 32.1 % — ABNORMAL LOW (ref 36.0–46.0)
Hemoglobin: 9.8 g/dL — ABNORMAL LOW (ref 12.0–15.0)
MCH: 27 pg (ref 26.0–34.0)
MCHC: 30.5 g/dL (ref 30.0–36.0)
MCV: 88.4 fL (ref 80.0–100.0)
Platelets: 380 10*3/uL (ref 150–400)
RBC: 3.63 MIL/uL — ABNORMAL LOW (ref 3.87–5.11)
RDW: 14.4 % (ref 11.5–15.5)
WBC: 6.7 10*3/uL (ref 4.0–10.5)
nRBC: 0 % (ref 0.0–0.2)

## 2020-12-15 LAB — GLUCOSE, CAPILLARY
Glucose-Capillary: 188 mg/dL — ABNORMAL HIGH (ref 70–99)
Glucose-Capillary: 209 mg/dL — ABNORMAL HIGH (ref 70–99)
Glucose-Capillary: 186 mg/dL — ABNORMAL HIGH (ref 70–99)
Glucose-Capillary: 146 mg/dL — ABNORMAL HIGH (ref 70–99)
Glucose-Capillary: 190 mg/dL — ABNORMAL HIGH (ref 70–99)

## 2020-12-15 LAB — BASIC METABOLIC PANEL
Anion gap: 8 (ref 5–15)
BUN: 13 mg/dL (ref 6–20)
CO2: 22 mmol/L (ref 22–32)
Calcium: 8.8 mg/dL — ABNORMAL LOW (ref 8.9–10.3)
Chloride: 104 mmol/L (ref 98–111)
Creatinine, Ser: 0.77 mg/dL (ref 0.44–1.00)
GFR, Estimated: >60 mL/min (ref >60)
Glucose, Bld: 198 mg/dL — ABNORMAL HIGH (ref 70–99)
Potassium: 4.7 mmol/L (ref 3.5–5.1)
Sodium: 134 mmol/L — ABNORMAL LOW (ref 135–145)

## 2020-12-15 MED ORDER — INSULIN ASPART 100 UNIT/ML IJ SOLN
0.0000 [IU] | Freq: Three times a day (TID) | INTRAMUSCULAR | Status: DC
Start: 2020-12-15 — End: 2020-12-28
  Administered 2020-12-16 – 2020-12-17 (×3): 2 [IU] via SUBCUTANEOUS
  Administered 2020-12-18: 12 [IU] via SUBCUTANEOUS
  Administered 2020-12-18: 16 [IU] via SUBCUTANEOUS
  Administered 2020-12-19: 4 [IU] via SUBCUTANEOUS
  Administered 2020-12-19 – 2020-12-21 (×4): 2 [IU] via SUBCUTANEOUS
  Administered 2020-12-22: 8 [IU] via SUBCUTANEOUS
  Administered 2020-12-23 – 2020-12-24 (×3): 2 [IU] via SUBCUTANEOUS
  Administered 2020-12-24: 4 [IU] via SUBCUTANEOUS
  Administered 2020-12-26 – 2020-12-27 (×2): 8 [IU] via SUBCUTANEOUS
  Administered 2020-12-27: 2 [IU] via SUBCUTANEOUS
  Administered 2020-12-27 – 2020-12-28 (×2): 4 [IU] via SUBCUTANEOUS

## 2020-12-15 MED ORDER — SODIUM CHLORIDE 0.9 % IV SOLN
INTRAVENOUS | Status: DC | PRN
  Administered 2020-12-15: 250 mL via INTRAVENOUS

## 2020-12-15 MED ORDER — ENOXAPARIN SODIUM 30 MG/0.3ML IJ SOSY
30.0000 mg | PREFILLED_SYRINGE | INTRAMUSCULAR | Status: DC
  Administered 2020-12-15 – 2020-12-16 (×2): 30 mg via SUBCUTANEOUS
  Filled 2020-12-15 (×2): qty 0.3

## 2020-12-15 MED ORDER — INSULIN ASPART 100 UNIT/ML IJ SOLN: 0.0000 [IU] | INTRAMUSCULAR | Status: DC

## 2020-12-15 NOTE — Progress Notes (Addendum)
      [address]       [place], [address]             [phone]      1 Day Post-Op Procedure(s) (LRB): STERNAL WOUND DEBRIDEMENT (N/A) APPLICATION OF WOUND VAC (N/A)   Subjective:  No complaints.  Doing well.  Pain is controlled, Denies N/V  Objective: Vital signs in last 24 hours: Temp:  [97 F (36.1 C)-98.9 F (37.2 C)] 98.4 F (36.9 C) (05/07 0757) Pulse Rate:  [80-101] 97 (05/07 0757) Cardiac Rhythm: Normal sinus rhythm (05/07 0749) Resp:  [9-20] 19 (05/07 0757) BP: (107-193)/(63-108) 107/66 (05/07 0757) SpO2:  [91 %-100 %] 96 % (05/07 0757) Weight:  [107 kg-109.4 kg] 107.9 kg (05/07 0428)  Intake/Output from previous day: 05/06 0701 - 05/07 0700 In: 1064.2 [P.O.:778; I.V.:30.2; IV Piggyback:256] Out: 51 [Drains:50; Blood:5]  General appearance: alert, cooperative and no distress Heart: regular rate and rhythm Lungs: clear to auscultation bilaterally Abdomen: soft, non-tender; bowel sounds normal; no masses,  no organomegaly Extremities: extremities normal, atraumatic, no cyanosis or edema Wound: wound vac in place on sternum, radial artery harvest site well healed  Lab Results: Recent Labs    12/14/20 1419 12/14/20 1629 12/15/20 0126  WBC 5.9  --  6.7  HGB 10.2* 9.9* 9.8*  HCT 34.6* 29.0* 32.1*  PLT 305  --  380   BMET:  Recent Labs    12/14/20 1753 12/15/20 0126  NA 136 134*  K 3.6 4.7  CL 105 104  CO2 25 22  GLUCOSE 117* 198*  BUN 9 13  CREATININE 0.80 0.77  CALCIUM 8.7* 8.8*    PT/INR: No results for input(s): LABPROT, INR in the last 72 hours. ABG    Component Value Date/Time   PHART 7.364 10/09/2020 2158   HCO3 23.9 10/09/2020 2158   TCO2 24 12/14/2020 1629   ACIDBASEDEF 1.0 10/09/2020 2158   O2SAT 59.6 10/27/2020 0530   CBG (last 3)  Recent Labs    12/14/20 2355 12/15/20 0323 12/15/20 0814  GLUCAP 188* 188* 209*    Assessment/Plan: S/P Procedure(s) (LRB): STERNAL WOUND DEBRIDEMENT (N/A) APPLICATION  OF WOUND VAC (N/A)  1. CV- NSR, BP elevated at times- continue Coreg, Bidil, Entresto 2. Pulm- no oxygen requirement, continue IS 3. Renal-creatinine stable, continue Aldactone, Demadex per home regimen 4. ID- no leukocytosis, afebrile, OR/blood cultures remain pending.. wound debrided 5/6 will plan for vac change on Monday with Dr. Orvan Seen 5. DM- ranging from 100-200s- continue insulin regimen, good glucose control will be imperative for wound healing 6. Lovenox for DVT prophylaxis   LOS: 1 day    Ellwood Handler, PA-C 12/15/2020   I have seen and examined the patient and agree with the assessment and plan as outlined.  Afebrile w/ normal WBC and cultures negative so far.  Rexene Alberts, MD 12/15/2020 10:46 AM

## 2020-12-16 LAB — GLUCOSE, CAPILLARY
Glucose-Capillary: 123 mg/dL — ABNORMAL HIGH (ref 70–99)
Glucose-Capillary: 87 mg/dL (ref 70–99)
Glucose-Capillary: 103 mg/dL — ABNORMAL HIGH (ref 70–99)
Glucose-Capillary: 112 mg/dL — ABNORMAL HIGH (ref 70–99)
Glucose-Capillary: 175 mg/dL — ABNORMAL HIGH (ref 70–99)

## 2020-12-16 LAB — CBC
HCT: 28.2 % — ABNORMAL LOW (ref 36.0–46.0)
Hemoglobin: 8.8 g/dL — ABNORMAL LOW (ref 12.0–15.0)
MCH: 27.2 pg (ref 26.0–34.0)
MCHC: 31.2 g/dL (ref 30.0–36.0)
MCV: 87.3 fL (ref 80.0–100.0)
Platelets: 330 10*3/uL (ref 150–400)
RBC: 3.23 MIL/uL — ABNORMAL LOW (ref 3.87–5.11)
RDW: 14.4 % (ref 11.5–15.5)
WBC: 6.6 10*3/uL (ref 4.0–10.5)
nRBC: 0 % (ref 0.0–0.2)

## 2020-12-16 LAB — COMPREHENSIVE METABOLIC PANEL
ALT: 9 U/L (ref 0–44)
AST: 10 U/L — ABNORMAL LOW (ref 15–41)
Albumin: 2.8 g/dL — ABNORMAL LOW (ref 3.5–5.0)
Alkaline Phosphatase: 59 U/L (ref 38–126)
Anion gap: 8 (ref 5–15)
BUN: 23 mg/dL — ABNORMAL HIGH (ref 6–20)
CO2: 23 mmol/L (ref 22–32)
Calcium: 8.5 mg/dL — ABNORMAL LOW (ref 8.9–10.3)
Chloride: 106 mmol/L (ref 98–111)
Creatinine, Ser: 1 mg/dL (ref 0.44–1.00)
GFR, Estimated: >60 mL/min (ref >60)
Glucose, Bld: 122 mg/dL — ABNORMAL HIGH (ref 70–99)
Potassium: 4 mmol/L (ref 3.5–5.1)
Sodium: 137 mmol/L (ref 135–145)
Total Bilirubin: 0.2 mg/dL — ABNORMAL LOW (ref 0.3–1.2)
Total Protein: 6.7 g/dL (ref 6.5–8.1)

## 2020-12-16 MED ORDER — SODIUM CHLORIDE 0.9 % IV SOLN
2.0000 g | Freq: Three times a day (TID) | INTRAVENOUS | Status: DC
  Administered 2020-12-16 – 2020-12-27 (×32): 2 g via INTRAVENOUS
  Filled 2020-12-16 (×33): qty 2

## 2020-12-16 MED ORDER — ENOXAPARIN SODIUM 40 MG/0.4ML IJ SOSY
40.0000 mg | PREFILLED_SYRINGE | INTRAMUSCULAR | Status: DC
  Administered 2020-12-17 – 2020-12-24 (×8): 40 mg via SUBCUTANEOUS
  Filled 2020-12-16 (×10): qty 0.4

## 2020-12-16 MED ORDER — SODIUM CHLORIDE 0.9 % IV SOLN
2.0000 g | INTRAVENOUS | Status: DC
  Administered 2020-12-16: 2 g via INTRAVENOUS
  Filled 2020-12-16: qty 20

## 2020-12-16 NOTE — Progress Notes (Signed)
Pharmacy Antibiotic Note  Tiffany Le is a 45 y.o. female admitted on 12/14/2020 with sternal wound drainage 2/2 recent CABG in March s/p I&D yesterday. Wound culture now growing Serratia. Pharmacy has been consulted for cefepime dosing for empiric coverage.  Will start cefepime 2g q8hr given kidney function. Although Serratia may result as "sensitive" to ceftriaxone, this antibiotic can induce resistance to cephalosporins. We recommend avoiding cefriaxone for empiric treatment of Serratia. Cefepime has very low cross-reactivity with penicillins, so not concerned with history of penicillin rash.  Plan: Stop ceftriaxone Start cefepime 2g q8hr Monitor cultures, renal function, and clinical progression  Height: 5\' 4"  (162.6 cm) Weight: 107.9 kg (237 lb 14 oz) IBW/kg (Calculated) : 54.7  Temp (24hrs), Avg:98.4 F (36.9 C), Min:98.1 F (36.7 C), Max:98.8 F (37.1 C)  Recent Labs  Lab 12/14/20 1419 12/14/20 1629 12/14/20 1753 12/15/20 0126 12/16/20 0028  WBC 5.9  --   --  6.7 6.6  CREATININE  --  0.60 0.80 0.77 1.00    Estimated Creatinine Clearance: 86.1 mL/min (by C-G formula based on SCr of 1 mg/dL).    Allergies  Allergen Reactions  . Penicillins Rash    Historical Has patient had a PCN reaction causing immediate rash, facial/tongue/throat swelling, SOB or lightheadedness with hypotension: No Has patient had a PCN reaction causing severe rash involving mucus membranes or skin necrosis: No Has patient had a PCN reaction that required hospitalization: No Has patient had a PCN reaction occurring within the last 10 years: No If all of the above answers are "NO", then may proceed with Cephalosporin use. Has tolerated cephalosporins    Antimicrobials this admission: 5/8 ceftriaxone x1 5/8 cefepime >>  Microbiology results: 5/6 bcx ngtd 5/6 OR sternal cx serratia marcescens - sensis pending  Thank you for allowing pharmacy to be a part of this patient's care.  Alfonse Spruce, PharmD PGY2 ID Pharmacy Resident Phone between 7 am - 3:30 pm: [phone]  Please check AMION for all [facility] phone numbers After 10:00 PM, call [place] [phone]  12/16/2020 1:46 PM

## 2020-12-16 NOTE — Progress Notes (Addendum)
      [address]       [place] [address]             [phone]       2 Days Post-Op Procedure(s) (LRB): STERNAL WOUND DEBRIDEMENT (N/A) APPLICATION OF WOUND VAC (N/A)   Subjective:  No complaints.  Patient remains stable  Objective: Vital signs in last 24 hours: Temp:  [98.1 F (36.7 C)-98.8 F (37.1 C)] 98.5 F (36.9 C) (05/08 0728) Pulse Rate:  [82-92] 84 (05/08 0800) Cardiac Rhythm: Normal sinus rhythm (05/08 0744) Resp:  [14-18] 16 (05/08 0800) BP: (92-128)/(56-67) 105/67 (05/08 0800) SpO2:  [95 %-100 %] 95 % (05/08 0800)  Intake/Output from previous day: 05/07 0701 - 05/08 0700 In: 720 [P.O.:720] Out: 800 [Urine:800] Intake/Output this shift: Total I/O In: 240 [P.O.:240] Out: 400 [Urine:400]  General appearance: alert, cooperative and no distress Heart: regular rate and rhythm Lungs: clear to auscultation bilaterally Abdomen: soft, non-tender; bowel sounds normal; no masses,  no organomegaly Extremities: extremities normal, atraumatic, no cyanosis or edema Wound: wound vac in place  Lab Results: Recent Labs    12/15/20 0126 12/16/20 0028  WBC 6.7 6.6  HGB 9.8* 8.8*  HCT 32.1* 28.2*  PLT 380 330   BMET:  Recent Labs    12/15/20 0126 12/16/20 0028  NA 134* 137  K 4.7 4.0  CL 104 106  CO2 22 23  GLUCOSE 198* 122*  BUN 13 23*  CREATININE 0.77 1.00  CALCIUM 8.8* 8.5*    PT/INR: No results for input(s): LABPROT, INR in the last 72 hours. ABG    Component Value Date/Time   PHART 7.364 10/09/2020 2158   HCO3 23.9 10/09/2020 2158   TCO2 24 12/14/2020 1629   ACIDBASEDEF 1.0 10/09/2020 2158   O2SAT 59.6 10/27/2020 0530   CBG (last 3)  Recent Labs    12/15/20 1609 12/15/20 2052 12/16/20 0612  GLUCAP 146* 190* 123*    Assessment/Plan: S/P Procedure(s) (LRB): STERNAL WOUND DEBRIDEMENT (N/A) APPLICATION OF WOUND VAC (N/A)  1. CV- NSR, BP improved- continue Coreg, Bidil, Entresto 2. Pulm- no acute issues, 3.  Renal- creatinine stable, home Spiro/Demadex ordered 4. ID- afebrile, blood cultures negative, OR culture showing rare Serratia Marcescens, re-incubated for better growth, will discuss ABX with Dr. Roxy Manns.... wound vac in place, will plan to change with Dr. Orvan Seen tomorrow 5. DM- cbgs under better control, continue current insulin regimen    LOS: 2 days    Ellwood Handler, PA-C 12/16/2020   I have seen and examined the patient and agree with the assessment and plan as outlined.  Will start I.V. Rocephin for + Serratia wound culture.  Rexene Alberts, MD 12/16/2020 11:30 AM

## 2020-12-17 ENCOUNTER — Other Ambulatory Visit: Payer: Self-pay

## 2020-12-17 ENCOUNTER — Other Ambulatory Visit: Payer: Self-pay | Admitting: *Deleted

## 2020-12-17 LAB — GLUCOSE, CAPILLARY
Glucose-Capillary: 120 mg/dL — ABNORMAL HIGH (ref 70–99)
Glucose-Capillary: 85 mg/dL (ref 70–99)
Glucose-Capillary: 74 mg/dL (ref 70–99)
Glucose-Capillary: 133 mg/dL — ABNORMAL HIGH (ref 70–99)
Glucose-Capillary: 127 mg/dL — ABNORMAL HIGH (ref 70–99)

## 2020-12-17 LAB — SURGICAL PCR SCREEN
MRSA, PCR: NEGATIVE
Staphylococcus aureus: NEGATIVE

## 2020-12-17 MED ORDER — METOCLOPRAMIDE HCL 5 MG/ML IJ SOLN
10.0000 mg | Freq: Four times a day (QID) | INTRAMUSCULAR | Status: AC
  Administered 2020-12-17 – 2020-12-18 (×4): 10 mg via INTRAVENOUS
  Filled 2020-12-17 (×4): qty 2

## 2020-12-17 NOTE — Patient Outreach (Signed)
[facility]) Care Management  12/17/2020  Tiffany Le [birth year] [id number]   [facility] care coordination for noted hospitalized Tiffany Le was referred to [facility] on 11/07/20 for Tiffany stroke For RED ON Tiffany ALERT Day #9Date: 11/06/20 1000 Tuesday Red Alert Reason: Sad, hopeless, anxious, or empty? Yes Issued smoking cessation, thinking about quitting smoking and stroke preventing secondary stroke EMMIs 2 unsuccessful outreaches to patient occurred Pt was reached successful on the third outreach on 11/26/20 in which the red alert reason was addressed. Pt reported the answer to the Tiffany question was correct on the day of the [facility] outreach as she was having difficulty obtaining her discharge medications. Pt reported the reason was resolved after calls to the hospital and a visit to her pcp  She denied need of [facility] SW and other resources. Her depression screen on 11/26/20 PHQ2= 1 There was a scheduled follow call attempt to the pt on 12/11/20 that was unsuccessful Prior to the today's (12/17/20) scheduled outreach, [facility] RN CM noted an admission on 12/14/20  Insurance:self pay, Medicaid pending- Not on [facility] admissions x3ED visits x 3in the last 6 months  [facility] admissions 12/14/20 top present congestive Heart Failure (CHF) 10/02/20 to 10/27/20 pulmonary edema with status post (s/p) Coronary artery bypass graft (CABG) x 4 on 10/09/20 09/24/20 to 09/27/20 Non ST elevation myocardial infarction (NSTEMI), Hypertension (HTN), uncontrolled 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  [facility] RN CM intervention Outreach to pcp office [phone], spoke with Tiffany Le who confirms the office has a SW, Tiffany Le - no Cm Left a message to include [facility] RN CM's office number for pcp SW to attempt to discuss pt needs and possible warm transfer  Return call from Tiffany Le, pcp SW to review [facility] Tiffany stroke pt case information and discuss possible warm  transfer of needs for patient after a further [facility] RN CM post hospital assessment Discussed possible pt financial concerns (medications, health coverage/pending medicaid, self pay co pays) that may be driving the continued medical care of this patient Tiffany Le confirms patient is engaged also with  CM/SW staff at heart care center as the staff member has also reached out to [place] Pt reported to be changing pcp staff at [employer] office Tiffany Le leaving)  To see Tiffany Le January 31 2021 per pcp SW   Plan: [facility] RN CM scheduled this patient for another call attempt within 4-7 business days pending possible hospital discharge  Sent note to [facility] hospital liaison - possible need of new referral for Tiffany stroke Sent this and 11/26/20 note to new pcp  Goals Addressed              This Visit's Progress     Patient Stated   .  [facility]) Decrease inpatient admissions/ readmissions (pt-stated)   Not on track     Timeframe:  Short Range Goal Priority:  High Start Date:                11/26/20             Expected End Date:  12/26/20                     Follow Up Date 12/21/20  Work with pcp and cardiac center staff on barriers to home care and for admission prevention   Notes 12/17/20 Re admission noted on 12/14/20 for CHF 12/11/20 unsuccessful Tiffany stroke follow up attempt  11/26/20 successful outreach denied depression s/s related to cva. Confirmed related to filling medications but resolved after pcp visit She denied need of [facility] SW and other resources. Her depression screen on 11/26/20 PHQ2= 1         Tiffany Le L. [name], RN, BSN, [place] Coordinator Office number [phone] Mobile number [phone]  Main [facility] number [phone] Fax number [phone]

## 2020-12-17 NOTE — Progress Notes (Signed)
AS per pt. she vomited and the MD is aware. She got relief after few min.wth no meds given.continue  to monitor.

## 2020-12-17 NOTE — Progress Notes (Addendum)
      [address]       ,[place] [address]             [phone]      3 Days Post-Op Procedure(s) (LRB): STERNAL WOUND DEBRIDEMENT (N/A) APPLICATION OF WOUND VAC (N/A)   Subjective:  Patient with nauseated this morning,  Had some vomiting.   Objective: Vital signs in last 24 hours: Temp:  [98 F (36.7 C)-98.3 F (36.8 C)] 98.1 F (36.7 C) (05/09 0745) Pulse Rate:  [85-93] 93 (05/09 0745) Cardiac Rhythm: Normal sinus rhythm (05/09 0745) Resp:  [13-17] 17 (05/09 0745) BP: (99-139)/(63-79) 108/63 (05/09 0745) SpO2:  [92 %-100 %] 96 % (05/09 0745)  Intake/Output from previous day: 05/08 0701 - 05/09 0700 In: 720 [P.O.:720] Out: 1200 [Urine:1200] Intake/Output this shift: Total I/O In: 120 [P.O.:120] Out: -   General appearance: alert, cooperative and no distress Heart: regular rate and rhythm Lungs: clear to auscultation bilaterally Abdomen: soft, non-tender; bowel sounds normal; no masses,  no organomegaly Extremities: extremities normal, atraumatic, no cyanosis or edema Wound: wound vac in place  Lab Results: Recent Labs    12/15/20 0126 12/16/20 0028  WBC 6.7 6.6  HGB 9.8* 8.8*  HCT 32.1* 28.2*  PLT 380 330   BMET:  Recent Labs    12/15/20 0126 12/16/20 0028  NA 134* 137  K 4.7 4.0  CL 104 106  CO2 22 23  GLUCOSE 198* 122*  BUN 13 23*  CREATININE 0.77 1.00  CALCIUM 8.8* 8.5*    PT/INR: No results for input(s): LABPROT, INR in the last 72 hours. ABG    Component Value Date/Time   PHART 7.364 10/09/2020 2158   HCO3 23.9 10/09/2020 2158   TCO2 24 12/14/2020 1629   ACIDBASEDEF 1.0 10/09/2020 2158   O2SAT 59.6 10/27/2020 0530   CBG (last 3)  Recent Labs    12/16/20 1530 12/16/20 2110 12/17/20 0621  GLUCAP 112* 175* 85    Assessment/Plan: S/P Procedure(s) (LRB): STERNAL WOUND DEBRIDEMENT (N/A) APPLICATION OF WOUND VAC (N/A)  1. CV- NSR, BP stable- continue Coreg, Bidil, Entresto 2. Pulm- no acute issues 3.  Renal- creatinine remains stable, home diuretics Spiro/Demadex as ordered 4. GI- nausea/vomiting, zofran prn, will add reglan prn 5. ID- afebrile, no leukocytosis, OR cultures growing Serratia Marcescens, continue Cefipime per pharmacy recs 6. DM-sugars controlled, continue current insulin regimen 7. dispo- patient stable, N/V this morning, continue anti-emetics, plan for wound vac change and debridement if necessary tomorrow afternoon in OR   LOS: 3 days    Ellwood Handler, PA-C 12/17/2020 Pt seen and examined; agree with documentation. Plan OR for debridement tomorrow am. [place]. Orvan Seen, [place]

## 2020-12-17 NOTE — Consult Note (Signed)
WOC Nurse Consult Note: Patient receiving care in [facility] 2C14. Reason for Consult: Sternal NPWT Wound type: surgical Per a Secure Chat message from primary RN [facility], received just a few minutes ago, "As per TCTS PA ,no need to change wound vac dressing, going to OR in am. Thanks"  The patient was not seen by the [facility] team, and I am removing her from our follow up list.  Please reconsult the [facility] team if we are needed in the future.  [place] nurse will not follow at this time.  Please re-consult the [place] team if needed.  Val Riles, RN, MSN, CWOCN, CNS-BC, pager [phone]

## 2020-12-17 NOTE — Plan of Care (Signed)
  Problem: Education: Goal: Knowledge of General Education information will improve Description Including pain rating scale, medication(s)/side effects and non-pharmacologic comfort measures Outcome: Progressing   

## 2020-12-18 ENCOUNTER — Inpatient Hospital Stay (HOSPITAL_COMMUNITY): Payer: Medicaid Other | Admitting: Certified Registered Nurse Anesthetist

## 2020-12-18 ENCOUNTER — Encounter (HOSPITAL_COMMUNITY): Payer: Self-pay | Admitting: Thoracic Surgery (Cardiothoracic Vascular Surgery)

## 2020-12-18 ENCOUNTER — Encounter (HOSPITAL_COMMUNITY)
Admission: AD | Disposition: A | Discharge status: Home / Self Care | Payer: Self-pay | Source: Home / Self Care | Attending: Cardiothoracic Surgery

## 2020-12-18 DIAGNOSIS — I9789 Other postprocedural complications and disorders of the circulatory system, not elsewhere classified: Secondary | ICD-10-CM

## 2020-12-18 HISTORY — PX: STERNAL WOUND DEBRIDEMENT: SHX1058

## 2020-12-18 LAB — GLUCOSE, CAPILLARY
Glucose-Capillary: 82 mg/dL (ref 70–99)
Glucose-Capillary: 81 mg/dL (ref 70–99)
Glucose-Capillary: 96 mg/dL (ref 70–99)
Glucose-Capillary: 299 mg/dL — ABNORMAL HIGH (ref 70–99)
Glucose-Capillary: 319 mg/dL — ABNORMAL HIGH (ref 70–99)

## 2020-12-18 SURGERY — DEBRIDEMENT, WOUND, STERNUM
Anesthesia: General

## 2020-12-18 MED ORDER — ONDANSETRON HCL 4 MG/2ML IJ SOLN
INTRAMUSCULAR | Status: AC
  Filled 2020-12-18: qty 2

## 2020-12-18 MED ORDER — 0.9 % SODIUM CHLORIDE (POUR BTL) OPTIME
TOPICAL | Status: DC | PRN
  Administered 2020-12-18: 2000 mL

## 2020-12-18 MED ORDER — ONDANSETRON HCL 4 MG/2ML IJ SOLN
INTRAMUSCULAR | Status: DC | PRN
  Administered 2020-12-18: 4 mg via INTRAVENOUS

## 2020-12-18 MED ORDER — PHENYLEPHRINE 40 MCG/ML (10ML) SYRINGE FOR IV PUSH (FOR BLOOD PRESSURE SUPPORT)
PREFILLED_SYRINGE | INTRAVENOUS | Status: AC
  Filled 2020-12-18: qty 10

## 2020-12-18 MED ORDER — LIDOCAINE 2% (20 MG/ML) 5 ML SYRINGE
INTRAMUSCULAR | Status: AC
  Filled 2020-12-18: qty 5

## 2020-12-18 MED ORDER — CARVEDILOL 6.25 MG PO TABS
6.2500 mg | ORAL_TABLET | Freq: Two times a day (BID) | ORAL | Status: DC
  Administered 2020-12-18 – 2020-12-28 (×19): 6.25 mg via ORAL
  Filled 2020-12-18 (×20): qty 1

## 2020-12-18 MED ORDER — LIDOCAINE 2% (20 MG/ML) 5 ML SYRINGE
INTRAMUSCULAR | Status: DC | PRN
  Administered 2020-12-18: 80 mg via INTRAVENOUS

## 2020-12-18 MED ORDER — MIDAZOLAM HCL 2 MG/2ML IJ SOLN
INTRAMUSCULAR | Status: DC | PRN
  Administered 2020-12-18: 2 mg via INTRAVENOUS

## 2020-12-18 MED ORDER — DEXAMETHASONE SODIUM PHOSPHATE 10 MG/ML IJ SOLN
INTRAMUSCULAR | Status: DC | PRN
  Administered 2020-12-18: 8 mg via INTRAVENOUS

## 2020-12-18 MED ORDER — FENTANYL CITRATE (PF) 250 MCG/5ML IJ SOLN
INTRAMUSCULAR | Status: AC
  Filled 2020-12-18: qty 5

## 2020-12-18 MED ORDER — SODIUM CHLORIDE 0.9 % IR SOLN
Status: DC | PRN
  Administered 2020-12-18: 3000 mL

## 2020-12-18 MED ORDER — DEXAMETHASONE SODIUM PHOSPHATE 10 MG/ML IJ SOLN
INTRAMUSCULAR | Status: AC
  Filled 2020-12-18: qty 1

## 2020-12-18 MED ORDER — SCOPOLAMINE 1 MG/3DAYS TD PT72
MEDICATED_PATCH | TRANSDERMAL | Status: DC | PRN
  Administered 2020-12-18: 1 via TRANSDERMAL

## 2020-12-18 MED ORDER — MIDAZOLAM HCL 2 MG/2ML IJ SOLN
INTRAMUSCULAR | Status: AC
  Filled 2020-12-18: qty 2

## 2020-12-18 MED ORDER — PROPOFOL 10 MG/ML IV BOLUS
INTRAVENOUS | Status: DC | PRN
  Administered 2020-12-18: 200 mg via INTRAVENOUS

## 2020-12-18 MED ORDER — CEFAZOLIN SODIUM-DEXTROSE 2-3 GM-%(50ML) IV SOLR
INTRAVENOUS | Status: DC | PRN
  Administered 2020-12-18: 2 g via INTRAVENOUS

## 2020-12-18 MED ORDER — FENTANYL CITRATE (PF) 250 MCG/5ML IJ SOLN
INTRAMUSCULAR | Status: DC | PRN
  Administered 2020-12-18: 50 ug via INTRAVENOUS

## 2020-12-18 MED ORDER — PROPOFOL 10 MG/ML IV BOLUS
INTRAVENOUS | Status: AC
  Filled 2020-12-18: qty 20

## 2020-12-18 MED ORDER — SUCCINYLCHOLINE CHLORIDE 200 MG/10ML IV SOSY
PREFILLED_SYRINGE | INTRAVENOUS | Status: AC
  Filled 2020-12-18: qty 10

## 2020-12-18 MED ORDER — PHENYLEPHRINE 40 MCG/ML (10ML) SYRINGE FOR IV PUSH (FOR BLOOD PRESSURE SUPPORT)
PREFILLED_SYRINGE | INTRAVENOUS | Status: DC | PRN
  Administered 2020-12-18 (×2): 120 ug via INTRAVENOUS
  Administered 2020-12-18: 160 ug via INTRAVENOUS
  Administered 2020-12-18: 120 ug via INTRAVENOUS

## 2020-12-18 MED ORDER — ALBUMIN HUMAN 5 % IV SOLN: INTRAVENOUS | Status: DC | PRN

## 2020-12-18 MED ORDER — SUCCINYLCHOLINE CHLORIDE 200 MG/10ML IV SOSY
PREFILLED_SYRINGE | INTRAVENOUS | Status: DC | PRN
  Administered 2020-12-18: 120 mg via INTRAVENOUS

## 2020-12-18 MED ORDER — LACTATED RINGERS IV SOLN: INTRAVENOUS | Status: DC | PRN

## 2020-12-18 SURGICAL SUPPLY — 65 items
APL SKNCLS STERI-STRIP NONHPOA (GAUZE/BANDAGES/DRESSINGS) ×2
BAG DECANTER FOR FLEXI CONT (MISCELLANEOUS) ×1 IMPLANT
BANDAGE ESMARK 6X9 LF (GAUZE/BANDAGES/DRESSINGS) IMPLANT
BENZOIN TINCTURE PRP APPL 2/3 (GAUZE/BANDAGES/DRESSINGS) ×2 IMPLANT
BLADE CLIPPER SURG (BLADE) ×1 IMPLANT
BLADE SURG 10 STRL SS (BLADE) ×2 IMPLANT
BNDG CMPR 9X6 STRL LF SNTH (GAUZE/BANDAGES/DRESSINGS)
BNDG ESMARK 6X9 LF (GAUZE/BANDAGES/DRESSINGS)
BNDG GAUZE ELAST 4 BULKY (GAUZE/BANDAGES/DRESSINGS) IMPLANT
CANISTER SUCT 3000ML PPV (MISCELLANEOUS) ×2 IMPLANT
CANISTER WOUND CARE 500ML ATS (WOUND CARE) ×2 IMPLANT
CANISTER WOUNDNEG PRESSURE 500 (CANNISTER) ×1 IMPLANT
CATH FOLEY 2WAY SLVR  5CC 16FR (CATHETERS)
CATH FOLEY 2WAY SLVR 5CC 16FR (CATHETERS) IMPLANT
CLIP VESOCCLUDE SM WIDE 24/CT (CLIP) IMPLANT
CNTNR URN SCR LID CUP LEK RST (MISCELLANEOUS) IMPLANT
CONT SPEC 4OZ STRL OR WHT (MISCELLANEOUS)
COVER SURGICAL LIGHT HANDLE (MISCELLANEOUS) ×3 IMPLANT
DRAPE INCISE IOBAN 66X45 STRL (DRAPES) IMPLANT
DRAPE LAPAROSCOPIC ABDOMINAL (DRAPES) ×2 IMPLANT
DRAPE WARM FLUID 44X44 (DRAPES) ×1 IMPLANT
DRSG ADAPTIC 3X8 NADH LF (GAUZE/BANDAGES/DRESSINGS) ×1 IMPLANT
DRSG AQUACEL AG ADV 3.5X14 (GAUZE/BANDAGES/DRESSINGS) ×1 IMPLANT
DRSG VAC ATS LRG SENSATRAC (GAUZE/BANDAGES/DRESSINGS) IMPLANT
DRSG VAC ATS MED SENSATRAC (GAUZE/BANDAGES/DRESSINGS) ×1 IMPLANT
DRSG VAC ATS SM SENSATRAC (GAUZE/BANDAGES/DRESSINGS) IMPLANT
ELECT CAUTERY BLADE 6.4 (BLADE) ×1 IMPLANT
ELECT REM PT RETURN 9FT ADLT (ELECTROSURGICAL)
ELECTRODE REM PT RTRN 9FT ADLT (ELECTROSURGICAL) ×1 IMPLANT
GAUZE SPONGE 4X4 12PLY STRL (GAUZE/BANDAGES/DRESSINGS) ×1 IMPLANT
GAUZE XEROFORM 5X9 LF (GAUZE/BANDAGES/DRESSINGS) IMPLANT
GLOVE BIOGEL PI ORTHO PRO 7.5 (GLOVE) ×1
GLOVE NEODERM STRL 7.5  LF PF (GLOVE) ×1
GLOVE NEODERM STRL 7.5 LF PF (GLOVE) ×1 IMPLANT
GLOVE PI ORTHO PRO STRL 7.5 (GLOVE) IMPLANT
GLOVE SS BIOGEL STRL SZ 8.5 (GLOVE) IMPLANT
GLOVE SUPERSENSE BIOGEL SZ 8.5 (GLOVE) ×1
GLOVE SURG NEODERM 7.5  LF PF (GLOVE) ×1
GOWN STRL REUS W/ TWL LRG LVL3 (GOWN DISPOSABLE) ×3 IMPLANT
GOWN STRL REUS W/TWL LRG LVL3 (GOWN DISPOSABLE) ×6
HANDPIECE INTERPULSE COAX TIP (DISPOSABLE) ×2
HEMOSTAT POWDER SURGIFOAM 1G (HEMOSTASIS) IMPLANT
HEMOSTAT SURGICEL 2X14 (HEMOSTASIS) IMPLANT
KIT BASIN OR (CUSTOM PROCEDURE TRAY) ×2 IMPLANT
KIT TURNOVER KIT B (KITS) ×2 IMPLANT
MICROMATRIX 1000MG (Tissue) ×2 IMPLANT
NS IRRIG 1000ML POUR BTL (IV SOLUTION) ×3 IMPLANT
PACK GENERAL/GYN (CUSTOM PROCEDURE TRAY) ×2 IMPLANT
PAD ARMBOARD 7.5X6 YLW CONV (MISCELLANEOUS) ×2 IMPLANT
SET HNDPC FAN SPRY TIP SCT (DISPOSABLE) IMPLANT
SOL PREP POV-IOD 4OZ 10% (MISCELLANEOUS) IMPLANT
SOLUTION PARTIC MCRMTRX 1000MG (Tissue) IMPLANT
SPONGE LAP 18X18 RF (DISPOSABLE) ×1 IMPLANT
STAPLER VISISTAT 35W (STAPLE) IMPLANT
SURGILUBE 2OZ TUBE FLIPTOP (MISCELLANEOUS) ×1 IMPLANT
SUT ETHILON 3 0 FSL (SUTURE) IMPLANT
SUT PDS AB 1 CTX 36 (SUTURE) IMPLANT
SUT PROLENE 2 0 MH 48 (SUTURE) IMPLANT
SUT VIC AB 2-0 CTX 27 (SUTURE) ×2 IMPLANT
SWAB COLLECTION DEVICE MRSA (MISCELLANEOUS) IMPLANT
SWAB CULTURE ESWAB REG 1ML (MISCELLANEOUS) IMPLANT
SYR 5ML LL (SYRINGE) IMPLANT
TOWEL GREEN STERILE (TOWEL DISPOSABLE) ×2 IMPLANT
TOWEL GREEN STERILE FF (TOWEL DISPOSABLE) ×2 IMPLANT
WATER STERILE IRR 1000ML POUR (IV SOLUTION) ×2 IMPLANT

## 2020-12-18 NOTE — H&P (Signed)
History and Physical Interval Note:  12/18/2020 7:39 AM  [name]  has presented today for surgery, with the diagnosis of sternal wound infection.  The various methods of treatment have been discussed with the patient and family. After consideration of risks, benefits and other options for treatment, the patient has consented to  Procedure(s): STERNAL WOUND DEBRIDEMENT, Wound Vac Change (N/A) as a surgical intervention.  The patient's history has been reviewed, patient examined, no change in status, stable for surgery.  I have reviewed the patient's chart and labs.  Questions were answered to the patient's satisfaction.     Tiffany Le

## 2020-12-18 NOTE — Brief Op Note (Signed)
12/18/2020  10:14 AM  PATIENT:  Tiffany Le  45 y.o. female  PRE-OPERATIVE DIAGNOSIS:  sternal wound infection  POST-OPERATIVE DIAGNOSIS:  sternal wound infection  PROCEDURE:  Procedure(s): STERNAL WOUND DEBRIDEMENT, Wound Vac Change (N/A)  SURGEON:  Surgeon(s) and Role:    * [name], MD - Primary  PHYSICIAN ASSISTANT:   ASSISTANTS: staff   ANESTHESIA:   general  EBL:  5 mL   BLOOD ADMINISTERED:none  DRAINS: none   LOCAL MEDICATIONS USED:  NONE  SPECIMEN:  No Specimen  DISPOSITION OF SPECIMEN:  N/A  COUNTS:  YES  TOURNIQUET:  * No tourniquets in log *  DICTATION: .Note written in EPIC  PLAN OF CARE: Admit to inpatient   PATIENT DISPOSITION:  PACU - hemodynamically stable.   Delay start of Pharmacological VTE agent (>24hrs) due to surgical blood loss or risk of bleeding: yes

## 2020-12-18 NOTE — Anesthesia Preprocedure Evaluation (Addendum)
Anesthesia Evaluation  Patient identified by MRN, date of birth, ID band Patient awake    Reviewed: Allergy & Precautions, NPO status , Patient's Chart, lab work & pertinent test results  Airway Mallampati: III  TM Distance: >3 FB Neck ROM: Full    Dental  (+) Teeth Intact, Dental Advisory Given   Pulmonary neg pulmonary ROS,    breath sounds clear to auscultation       Cardiovascular hypertension, + CAD, + Past MI, + CABG and +CHF   Rhythm:Regular Rate:Normal     Neuro/Psych CVA    GI/Hepatic negative GI ROS, Neg liver ROS,   Endo/Other  diabetes  Renal/GU negative Renal ROS     Musculoskeletal negative musculoskeletal ROS (+)   Abdominal Normal abdominal exam  (+)   Peds  Hematology negative hematology ROS (+)   Anesthesia Other Findings - HLD  Reproductive/Obstetrics                            Anesthesia Physical Anesthesia Plan  ASA: III  Anesthesia Plan: General   Post-op Pain Management:    Induction: Intravenous  PONV Risk Score and Plan: 4 or greater and Ondansetron, Dexamethasone, Midazolam and Scopolamine patch - Pre-op  Airway Management Planned: Oral ETT  Additional Equipment: None  Intra-op Plan:   Post-operative Plan: Extubation in OR  Informed Consent: I have reviewed the patients History and Physical, chart, labs and discussed the procedure including the risks, benefits and alternatives for the proposed anesthesia with the patient or authorized representative who has indicated his/her understanding and acceptance.     Dental advisory given  Plan Discussed with: CRNA  Anesthesia Plan Comments: (Echo:  2. The left ventricular ejection fraction, by estimation, is 40 to 45%.  The left ventricle has mildly decreased function.  3. Based on limited views, there is severe hypokinesis of all septal  segments, the mid-to-apical anterior LV segments, and apex.   4. There is mild asymmetric left ventricular hypertrophy of the posterior  LV segment.  5. Left ventricular diastolic parameters are indeterminate due to  arrhythmia. Elevated left atrial pressure.  6. Right ventricular systolic function is mildly reduced. The right  ventricular size is normal.  7. The mitral valve is normal in structure. Mild mitral valve  regurgitation.  8. The aortic valve is tricuspid. Aortic valve regurgitation is not  visualized. No aortic stenosis is present. )       Anesthesia Quick Evaluation

## 2020-12-18 NOTE — Op Note (Signed)
Procedure(s): STERNAL WOUND DEBRIDEMENT, Wound Vac Change Procedure Note  Tiffany Le female 45 y.o. 12/18/2020  Procedure(s) and Anesthesia Type:    * STERNAL WOUND DEBRIDEMENT, Wound Vac Change - General  Surgeon(s) and Role:    * [name], MD - Primary   Indications: The patient was admitted to the hospital with superficial sternal infection status post CABG.  She is taken to the operating room for [facility] change and further debridement     Surgeon: [name]   Assistants: StaffGeneral endotracheal anesthesia  Anesthesia: General endotracheal anesthesia  ASA Class: 3    Procedure Detail  STERNAL WOUND DEBRIDEMENT, Wound Vac Change After informed consent, the patient was taken to the operating room on the above listed date.  She was placed in the supine position.  Anesthesia was induced smoothly.  The anterior chest was cleansed and draped sterilely.  A preop surgical pause was performed.  The sternal defect was irrigated copiously.  This was gently debrided of any fibrinous material.  Biologic substrate was applied to the bed of the cleansed wound.  This was then covered with a VAC foam cut to fit the defect.  VAC drape was applied.  This concluded the procedure.  All sponge instrument and needle counts were correct.  Estimated Blood Loss:  Minimal         Drains: None          Blood Given: none          Specimens: None         Implants: none        Complications:  * No complications entered in OR log *         Disposition: PACU - hemodynamically stable.         Condition: stable

## 2020-12-18 NOTE — Progress Notes (Signed)
Back from PACU by bed awake and alert. Denied any discomfort. 

## 2020-12-18 NOTE — Transfer of Care (Signed)
Immediate Anesthesia Transfer of Care Note  Patient: Tiffany Le  Procedure(s) Performed: STERNAL WOUND DEBRIDEMENT, Wound Vac Change (N/A )  Patient Location: PACU  Anesthesia Type:General  Level of Consciousness: drowsy and patient cooperative  Airway & Oxygen Therapy: Patient Spontanous Breathing and Patient connected to face mask oxygen  Post-op Assessment: Report given to RN and Post -op Vital signs reviewed and stable  Post vital signs: Reviewed and stable  Last Vitals:  Vitals Value Taken Time  BP 117/56 12/18/20 0856  Temp    Pulse 86 12/18/20 0859  Resp 18 12/18/20 0859  SpO2 95 % 12/18/20 0859  Vitals shown include unvalidated device data.  Last Pain:  Vitals:   12/18/20 0300  TempSrc: Oral  PainSc:       Patients Stated Pain Goal: 3 (76/28/31 5176)  Complications: No complications documented.

## 2020-12-18 NOTE — TOC Progression Note (Addendum)
Transition of Care [facility]) - Progression Note    Patient Details  Name: Tiffany Le MRN: [id number] Date of Birth: 04-02-1976  Transition of Care [facility]) CM/SW Contact  Zenon Mayo, RN Phone Number: 12/18/2020, 2:09 PM  Clinical Narrative:    Patient has wound vac to sternal wound, and she does not have any insurance, will probably go home with wound vac per [place] PA. She will need to get wound vac for charity til get a Medicaid Pending number. Will fax the charity application to [facility] with [facility]M. Will need HHRN for charity as well.  Left message with Lattie Haw with [facility] for referral for charity [facility] for wound vac, awaiting call back. Received call back from [place] stating she is unable to take referral. Will try [facility] they are charity this week. NCM made referral to [facility] with [facility] for [place] for wound vac care , awaiting call back.       Expected Discharge Plan and Services                                                 Social Determinants of Health (SDOH) Interventions    Readmission Risk Interventions Readmission Risk Prevention Plan 10/11/2020  Transportation Screening Complete  HRI or [place] Complete  Social Work Consult for [place] Planning/Counseling Complete  Palliative Care Screening Not Applicable  Medication Review Press photographer) Complete  Some recent data might be hidden

## 2020-12-18 NOTE — Progress Notes (Signed)
Pt. Is in the OR during shift change.

## 2020-12-18 NOTE — Anesthesia Procedure Notes (Signed)
Procedure Name: Intubation Date/Time: 12/18/2020 8:17 AM Performed by: Kathryne Hitch, CRNA Pre-anesthesia Checklist: Patient identified, Emergency Drugs available, Suction available and Patient being monitored Patient Re-evaluated:Patient Re-evaluated prior to induction Oxygen Delivery Method: Circle system utilized Preoxygenation: Pre-oxygenation with 100% oxygen Induction Type: IV induction Ventilation: Mask ventilation without difficulty Laryngoscope Size: Miller and 2 Grade View: Grade I Tube type: Oral Tube size: 7.0 mm Number of attempts: 1 Airway Equipment and Method: Stylet and Oral airway Placement Confirmation: ETT inserted through vocal cords under direct vision,  positive ETCO2 and breath sounds checked- equal and bilateral Secured at: 21 cm Tube secured with: Tape Dental Injury: Teeth and Oropharynx as per pre-operative assessment

## 2020-12-19 ENCOUNTER — Ambulatory Visit: Payer: Self-pay | Admitting: Family Medicine

## 2020-12-19 ENCOUNTER — Encounter (HOSPITAL_COMMUNITY): Payer: Self-pay | Admitting: Cardiothoracic Surgery

## 2020-12-19 LAB — CULTURE, BLOOD (ROUTINE X 2)
Culture: NO GROWTH
Culture: NO GROWTH
Special Requests: ADEQUATE

## 2020-12-19 LAB — GLUCOSE, CAPILLARY
Glucose-Capillary: 94 mg/dL (ref 70–99)
Glucose-Capillary: 105 mg/dL — ABNORMAL HIGH (ref 70–99)
Glucose-Capillary: 167 mg/dL — ABNORMAL HIGH (ref 70–99)
Glucose-Capillary: 132 mg/dL — ABNORMAL HIGH (ref 70–99)

## 2020-12-19 LAB — AEROBIC/ANAEROBIC CULTURE W GRAM STAIN (SURGICAL/DEEP WOUND)

## 2020-12-19 NOTE — Progress Notes (Signed)
[address]       [place] [address]             [phone]      1 Day Post-Op Procedure(s) (LRB): STERNAL WOUND DEBRIDEMENT, Wound Vac Change (N/A) Subjective: Minor discomfort  Objective: Vital signs in last 24 hours: Temp:  [97.8 F (36.6 C)-99.3 F (37.4 C)] 98.2 F (36.8 C) (05/11 0300) Pulse Rate:  [80-94] 80 (05/11 0300) Cardiac Rhythm: Normal sinus rhythm (05/10 1900) Resp:  [8-23] 14 (05/11 0300) BP: (109-129)/(56-80) 117/68 (05/11 0300) SpO2:  [91 %-100 %] 99 % (05/11 0300)  Hemodynamic parameters for last 24 hours:    Intake/Output from previous day: 05/10 0701 - 05/11 0700 In: 1979 [P.O.:719; I.V.:1000; IV Piggyback:260] Out: 55 [Drains:50; Blood:5] Intake/Output this shift: No intake/output data recorded.  General appearance: alert, cooperative and no distress Heart: regular rate and rhythm Lungs: clear to auscultation bilaterally Abdomen: benign Extremities: no edema Wound: VAC in place  Lab Results: No results for input(s): WBC, HGB, HCT, PLT in the last 72 hours. BMET: No results for input(s): NA, K, CL, CO2, GLUCOSE, BUN, CREATININE, CALCIUM in the last 72 hours.  PT/INR: No results for input(s): LABPROT, INR in the last 72 hours. ABG    Component Value Date/Time   PHART 7.364 10/09/2020 2158   HCO3 23.9 10/09/2020 2158   TCO2 24 12/14/2020 1629   ACIDBASEDEF 1.0 10/09/2020 2158   O2SAT 59.6 10/27/2020 0530   CBG (last 3)  Recent Labs    12/18/20 1556 12/18/20 2128 12/19/20 0544  GLUCAP 299* 319* 94    Meds Scheduled Meds: . aspirin EC  81 mg Oral Daily  . atorvastatin  80 mg Oral Daily  . bisacodyl  10 mg Oral Daily  . carvedilol  6.25 mg Oral BID WC  . dapagliflozin propanediol  10 mg Oral Daily  . enoxaparin (LOVENOX) injection  40 mg Subcutaneous Q24H  . gabapentin  100 mg Oral TID  . glipiZIDE  10 mg Oral QAC breakfast  . insulin aspart  0-24 Units Subcutaneous TID AC & HS  . insulin glargine  8  Units Subcutaneous BID  . isosorbide-hydrALAZINE  1 tablet Oral TID  . multivitamin with minerals  1 tablet Oral Daily  . sacubitril-valsartan  1 tablet Oral BID  . senna-docusate  1 tablet Oral QHS  . spironolactone  25 mg Oral Daily  . torsemide  40 mg Oral Daily   Continuous Infusions: . sodium chloride 250 mL (12/15/20 0109)  . ceFEPime (MAXIPIME) IV 2 g (12/19/20 2683)   PRN Meds:.sodium chloride, acetaminophen, albuterol, ondansetron (ZOFRAN) IV, oxyCODONE, traMADol  Recent Results (from the past 240 hour(s))  SARS Coronavirus 2 by RT PCR (hospital order, performed in [facility] hospital lab) Nasopharyngeal Nasopharyngeal Swab     Status: None   Collection Time: 12/14/20  2:19 PM   Specimen: Nasopharyngeal Swab  Result Value Ref Range Status   SARS Coronavirus 2 NEGATIVE NEGATIVE Final    Comment: (NOTE) SARS-CoV-2 target nucleic acids are NOT DETECTED.  The SARS-CoV-2 RNA is generally detectable in upper and lower respiratory specimens during the acute phase of infection. The lowest concentration of SARS-CoV-2 viral copies this assay can detect is 250 copies / mL. A negative result does not preclude SARS-CoV-2 infection and should not be used as the sole basis for treatment or other patient management decisions.  A negative result may occur with improper specimen collection / handling, submission of specimen  other than nasopharyngeal swab, presence of viral mutation(s) within the areas targeted by this assay, and inadequate number of viral copies (<250 copies / mL). A negative result must be combined with clinical observations, patient history, and epidemiological information.  Fact Sheet for Patients:   StrictlyIdeas.no  Fact Sheet for Healthcare Providers: BankingDealers.co.za  This test is not yet approved or  cleared by the [place] FDA and has been authorized for detection and/or diagnosis of SARS-CoV-2 by FDA  under an Emergency Use Authorization (EUA).  This EUA will remain in effect (meaning this test can be used) for the duration of the COVID-19 declaration under Section 564(b)(1) of the Act, 21 U.S.C. section 360bbb-3(b)(1), unless the authorization is terminated or revoked sooner.  Performed at [facility], [place] [address] [place], [place] [address]   Culture, blood (Routine X 2) w Reflex to ID Panel     Status: None (Preliminary result)   Collection Time: 12/14/20  3:20 PM   Specimen: BLOOD RIGHT FOREARM  Result Value Ref Range Status   Specimen Description BLOOD RIGHT FOREARM  Final   Special Requests   Final    BOTTLES DRAWN AEROBIC AND ANAEROBIC Blood Culture results may not be optimal due to an inadequate volume of blood received in culture bottles   Culture   Final    NO GROWTH 4 DAYS Performed at [facility], [place] [address] [place], [place] [address]    Report Status PENDING  Incomplete  Culture, blood (Routine X 2) w Reflex to ID Panel     Status: None (Preliminary result)   Collection Time: 12/14/20  3:33 PM   Specimen: BLOOD  Result Value Ref Range Status   Specimen Description BLOOD LEFT ANTECUBITAL  Final   Special Requests   Final    BOTTLES DRAWN AEROBIC AND ANAEROBIC Blood Culture adequate volume   Culture   Final    NO GROWTH 4 DAYS Performed at [facility], [place] [address], [place], [place] [address]    Report Status PENDING  Incomplete  Fungus Culture With Stain     Status: None (Preliminary result)   Collection Time: 12/14/20  4:36 PM   Specimen: Chest; Wound  Result Value Ref Range Status   Fungus Stain Final report  Final    Comment: (NOTE) Performed At: [facility] [place], [place] [id number] Rush Farmer MD MV:[id number]    Fungus (Mycology) Culture PENDING  Incomplete   Fungal Source WOUND  Final    Comment: CHEST STERNAL Performed at [facility], [place] [address], [place], [place] [address]    Aerobic/Anaerobic Culture w Gram Stain (surgical/deep wound)     Status: None (Preliminary result)   Collection Time: 12/14/20  4:36 PM   Specimen: Chest; Wound  Result Value Ref Range Status   Specimen Description WOUND CHEST  Final   Special Requests STERNAL  Final   Gram Stain   Final    FEW WBC PRESENT, PREDOMINANTLY PMN NO ORGANISMS SEEN Performed at [facility], [address], [place], [place] [address]    Culture   Final    RARE SERRATIA MARCESCENS NO ANAEROBES ISOLATED; CULTURE IN PROGRESS FOR 5 DAYS    Report Status PENDING  Incomplete   Organism ID, Bacteria SERRATIA MARCESCENS  Final      Susceptibility   Serratia marcescens - MIC*    CEFAZOLIN >=64 RESISTANT Resistant     CEFEPIME <=0.12 SENSITIVE Sensitive  CEFTAZIDIME <=1 SENSITIVE Sensitive     CEFTRIAXONE <=0.25 SENSITIVE Sensitive     CIPROFLOXACIN <=0.25 SENSITIVE Sensitive     GENTAMICIN <=1 SENSITIVE Sensitive     TRIMETH/SULFA <=20 SENSITIVE Sensitive     * RARE SERRATIA MARCESCENS  Fungus Culture Result     Status: None   Collection Time: 12/14/20  4:36 PM  Result Value Ref Range Status   Result 1 Comment  Final    Comment: (NOTE) KOH/Calcofluor preparation:  no fungus observed. Performed At: [facility] [place], [place] [id number] Rush Farmer MD JQ:[id number]   Surgical pcr screen     Status: None   Collection Time: 12/17/20  5:12 PM   Specimen: Nasal Mucosa; Nasal Swab  Result Value Ref Range Status   MRSA, PCR NEGATIVE NEGATIVE Final   Staphylococcus aureus NEGATIVE NEGATIVE Final    Comment: (NOTE) The Xpert SA Assay (FDA approved for NASAL specimens in patients 62 years of age and older), is one component of a comprehensive surveillance program. It is not intended to diagnose infection nor to guide or monitor treatment. Performed at [facility], [place] [address], [place], [place] [address]       Xrays No results  found.  Assessment/Plan: S/P Procedure(s) (LRB): STERNAL WOUND DEBRIDEMENT, Wound Vac Change (N/A)  1 Tmax 99.3, SR, VSS 2 sats good on RA 3 variable control with CBG's, 96-319 yesterday- on farxiga ,glipizide, SSI  and lantus- may need adjustment but follow for now with current therapy 4 no new labs 5 on maxepime for serratia marscesens- sensitive 6 minimal drainage 7 dressing change poss friday       LOS: 5 days    [name] PA-C Pager [phone] 12/19/2020

## 2020-12-19 NOTE — Anesthesia Postprocedure Evaluation (Signed)
Anesthesia Post Note  Patient: Tiffany Le  Procedure(s) Performed: STERNAL WOUND DEBRIDEMENT, Wound Vac Change (N/A )     Patient location during evaluation: PACU Anesthesia Type: General Level of consciousness: awake and alert Pain management: pain level controlled Vital Signs Assessment: post-procedure vital signs reviewed and stable Respiratory status: spontaneous breathing, nonlabored ventilation, respiratory function stable and patient connected to nasal cannula oxygen Cardiovascular status: blood pressure returned to baseline and stable Postop Assessment: no apparent nausea or vomiting Anesthetic complications: no   No complications documented.              Effie Berkshire

## 2020-12-19 NOTE — H&P (View-Only) (Signed)
Call to speak with patient to discuss plans for surgery tomorrow. I did discuss with her that I would be by tomorrow morning to discuss the plan for surgical intervention. We briefly discussed the plan for surgery and all of her questions were answered. She is aware to remain NPO effective at midnight.   She is aware that tomorrow we will plan to further discuss surgery and any additional questions can be answered.

## 2020-12-19 NOTE — Progress Notes (Signed)
Call to speak with patient to discuss plans for surgery tomorrow. I did discuss with her that I would be by tomorrow morning to discuss the plan for surgical intervention. We briefly discussed the plan for surgery and all of her questions were answered. She is aware to remain NPO effective at midnight.   She is aware that tomorrow we will plan to further discuss surgery and any additional questions can be answered. 

## 2020-12-20 ENCOUNTER — Encounter (HOSPITAL_COMMUNITY)
Admission: AD | Disposition: A | Discharge status: Home / Self Care | Payer: Self-pay | Source: Home / Self Care | Attending: Cardiothoracic Surgery

## 2020-12-20 ENCOUNTER — Inpatient Hospital Stay (HOSPITAL_COMMUNITY): Payer: Medicaid Other | Admitting: Certified Registered Nurse Anesthetist

## 2020-12-20 ENCOUNTER — Encounter (HOSPITAL_COMMUNITY): Payer: Self-pay | Admitting: Thoracic Surgery (Cardiothoracic Vascular Surgery)

## 2020-12-20 DIAGNOSIS — T8132XA Disruption of internal operation (surgical) wound, not elsewhere classified, initial encounter: Secondary | ICD-10-CM

## 2020-12-20 HISTORY — PX: INCISION AND DRAINAGE OF WOUND: SHX1803

## 2020-12-20 LAB — GLUCOSE, CAPILLARY
Glucose-Capillary: 78 mg/dL (ref 70–99)
Glucose-Capillary: 95 mg/dL (ref 70–99)
Glucose-Capillary: 87 mg/dL (ref 70–99)
Glucose-Capillary: 88 mg/dL (ref 70–99)
Glucose-Capillary: 160 mg/dL — ABNORMAL HIGH (ref 70–99)
Glucose-Capillary: 132 mg/dL — ABNORMAL HIGH (ref 70–99)

## 2020-12-20 SURGERY — IRRIGATION AND DEBRIDEMENT WOUND
Anesthesia: Monitor Anesthesia Care | Site: Chest

## 2020-12-20 MED ORDER — ONDANSETRON HCL 4 MG/2ML IJ SOLN
INTRAMUSCULAR | Status: AC
  Filled 2020-12-20: qty 2

## 2020-12-20 MED ORDER — MIDAZOLAM HCL 2 MG/2ML IJ SOLN
INTRAMUSCULAR | Status: AC
  Filled 2020-12-20: qty 2

## 2020-12-20 MED ORDER — MIDAZOLAM HCL 5 MG/5ML IJ SOLN
INTRAMUSCULAR | Status: DC | PRN
  Administered 2020-12-20: 2 mg via INTRAVENOUS

## 2020-12-20 MED ORDER — CHLORHEXIDINE GLUCONATE 4 % EX LIQD: 60.0000 mL | Freq: Once | CUTANEOUS | Status: DC

## 2020-12-20 MED ORDER — FENTANYL CITRATE (PF) 100 MCG/2ML IJ SOLN: 25.0000 ug | INTRAMUSCULAR | Status: DC | PRN

## 2020-12-20 MED ORDER — ONDANSETRON HCL 4 MG/2ML IJ SOLN
INTRAMUSCULAR | Status: DC | PRN
  Administered 2020-12-20: 4 mg via INTRAVENOUS

## 2020-12-20 MED ORDER — DEXAMETHASONE SODIUM PHOSPHATE 10 MG/ML IJ SOLN
INTRAMUSCULAR | Status: AC
  Filled 2020-12-20: qty 1

## 2020-12-20 MED ORDER — FENTANYL CITRATE (PF) 100 MCG/2ML IJ SOLN
INTRAMUSCULAR | Status: DC | PRN
  Administered 2020-12-20 (×3): 50 ug via INTRAVENOUS

## 2020-12-20 MED ORDER — ONDANSETRON HCL 4 MG/2ML IJ SOLN: 4.0000 mg | Freq: Once | INTRAMUSCULAR | Status: DC | PRN

## 2020-12-20 MED ORDER — LACTATED RINGERS IV SOLN: INTRAVENOUS | Status: DC

## 2020-12-20 MED ORDER — CHLORHEXIDINE GLUCONATE 0.12 % MT SOLN
OROMUCOSAL | Status: AC
  Filled 2020-12-20: qty 15

## 2020-12-20 MED ORDER — LIDOCAINE 2% (20 MG/ML) 5 ML SYRINGE
INTRAMUSCULAR | Status: AC
  Filled 2020-12-20: qty 5

## 2020-12-20 MED ORDER — FENTANYL CITRATE (PF) 250 MCG/5ML IJ SOLN
INTRAMUSCULAR | Status: AC
  Filled 2020-12-20: qty 5

## 2020-12-20 SURGICAL SUPPLY — 58 items
APL SKNCLS STERI-STRIP NONHPOA (GAUZE/BANDAGES/DRESSINGS) ×1
APL SWBSTK 6 STRL LF DISP (MISCELLANEOUS)
APPLICATOR COTTON TIP 6 STRL (MISCELLANEOUS) IMPLANT
APPLICATOR COTTON TIP 6IN STRL (MISCELLANEOUS) IMPLANT
BAG DECANTER FOR FLEXI CONT (MISCELLANEOUS) IMPLANT
BENZOIN TINCTURE PRP APPL 2/3 (GAUZE/BANDAGES/DRESSINGS) ×2 IMPLANT
CANISTER SUCT 3000ML PPV (MISCELLANEOUS) ×2 IMPLANT
CANISTER WOUNDNEG PRESSURE 500 (CANNISTER) ×1 IMPLANT
CNTNR URN SCR LID CUP LEK RST (MISCELLANEOUS) IMPLANT
CONT SPEC 4OZ STRL OR WHT (MISCELLANEOUS)
COVER SURGICAL LIGHT HANDLE (MISCELLANEOUS) ×2 IMPLANT
COVER WAND RF STERILE (DRAPES) ×2 IMPLANT
DRAIN CHANNEL 19F RND (DRAIN) IMPLANT
DRAIN JP 10F RND SILICONE (MISCELLANEOUS) IMPLANT
DRAPE HALF SHEET 40X57 (DRAPES) IMPLANT
DRAPE IMP U-DRAPE 54X76 (DRAPES) ×2 IMPLANT
DRAPE INCISE IOBAN 66X45 STRL (DRAPES) IMPLANT
DRAPE LAPAROSCOPIC ABDOMINAL (DRAPES) IMPLANT
DRAPE LAPAROTOMY 100X72 PEDS (DRAPES) ×2 IMPLANT
DRSG ADAPTIC 3X8 NADH LF (GAUZE/BANDAGES/DRESSINGS) IMPLANT
DRSG CUTIMED SORBACT 7X9 (GAUZE/BANDAGES/DRESSINGS) ×1 IMPLANT
DRSG PAD ABDOMINAL 8X10 ST (GAUZE/BANDAGES/DRESSINGS) IMPLANT
DRSG VAC ATS LRG SENSATRAC (GAUZE/BANDAGES/DRESSINGS) IMPLANT
DRSG VAC ATS MED SENSATRAC (GAUZE/BANDAGES/DRESSINGS) ×1 IMPLANT
DRSG VAC ATS SM SENSATRAC (GAUZE/BANDAGES/DRESSINGS) IMPLANT
ELECT CAUTERY BLADE 6.4 (BLADE) IMPLANT
ELECT REM PT RETURN 9FT ADLT (ELECTROSURGICAL) ×2
ELECTRODE REM PT RTRN 9FT ADLT (ELECTROSURGICAL) ×1 IMPLANT
GAUZE SPONGE 4X4 12PLY STRL (GAUZE/BANDAGES/DRESSINGS) IMPLANT
GLOVE BIO SURGEON STRL SZ 6.5 (GLOVE) ×2 IMPLANT
GLOVE BIOGEL M 6.5 STRL (GLOVE) ×2 IMPLANT
GOWN STRL REUS W/ TWL LRG LVL3 (GOWN DISPOSABLE) ×3 IMPLANT
GOWN STRL REUS W/TWL LRG LVL3 (GOWN DISPOSABLE) ×6
KIT BASIN OR (CUSTOM PROCEDURE TRAY) ×2 IMPLANT
KIT TURNOVER KIT B (KITS) ×2 IMPLANT
MATRIX WOUND 3-LAYER 10X15 (Tissue) ×1 IMPLANT
MICROMATRIX 1000MG (Tissue) ×2 IMPLANT
NDL HYPO 25GX1X1/2 BEV (NEEDLE) ×1 IMPLANT
NEEDLE HYPO 25GX1X1/2 BEV (NEEDLE) ×2 IMPLANT
NS IRRIG 1000ML POUR BTL (IV SOLUTION) ×2 IMPLANT
PACK GENERAL/GYN (CUSTOM PROCEDURE TRAY) ×2 IMPLANT
PACK UNIVERSAL I (CUSTOM PROCEDURE TRAY) ×2 IMPLANT
PAD ARMBOARD 7.5X6 YLW CONV (MISCELLANEOUS) ×4 IMPLANT
SET ADHESIVE SKIN CLSR ABRA (MISCELLANEOUS) ×4 IMPLANT
SOLUTION PARTIC MCRMTRX 1000MG (Tissue) IMPLANT
STAPLER VISISTAT 35W (STAPLE) ×2 IMPLANT
SURGILUBE 2OZ TUBE FLIPTOP (MISCELLANEOUS) ×1 IMPLANT
SUT MNCRL AB 3-0 PS2 27 (SUTURE) IMPLANT
SUT MNCRL AB 4-0 PS2 18 (SUTURE) IMPLANT
SUT MON AB 2-0 CT1 36 (SUTURE) IMPLANT
SUT MON AB 5-0 PS2 18 (SUTURE) IMPLANT
SUT VIC AB 5-0 PS2 18 (SUTURE) ×2 IMPLANT
SUT VICRYL 3 0 (SUTURE) IMPLANT
SWAB COLLECTION DEVICE MRSA (MISCELLANEOUS) IMPLANT
SWAB CULTURE ESWAB REG 1ML (MISCELLANEOUS) IMPLANT
SYR CONTROL 10ML LL (SYRINGE) ×2 IMPLANT
TOWEL GREEN STERILE (TOWEL DISPOSABLE) ×2 IMPLANT
UNDERPAD 30X36 HEAVY ABSORB (UNDERPADS AND DIAPERS) ×2 IMPLANT

## 2020-12-20 NOTE — Anesthesia Preprocedure Evaluation (Addendum)
Anesthesia Evaluation  Patient identified by MRN, date of birth, ID band Patient awake    Reviewed: Allergy & Precautions, NPO status , Patient's Chart, lab work & pertinent test results, reviewed documented beta blocker date and time   Airway Mallampati: III  TM Distance: >3 FB Neck ROM: Full    Dental  (+) Teeth Intact, Dental Advisory Given   Pulmonary neg pulmonary ROS, shortness of breath and with exertion,    breath sounds clear to auscultation       Cardiovascular hypertension, Pt. 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 wound   Neuro/Psych CVA, Residual Symptoms negative psych ROS   GI/Hepatic negative GI ROS, Neg liver ROS,   Endo/Other  diabetes, Poorly Controlled, Type 2Morbid obesityHLD  Renal/GU negative Renal ROS  negative genitourinary   Musculoskeletal negative musculoskeletal ROS (+) Sternal wound   Abdominal Normal abdominal exam  (+) + obese,   Peds  Hematology  (+) anemia , Lovenox therapy   Anesthesia Other Findings Had OJ no pulp this am for hypoglycemia  Reproductive/Obstetrics                          Anesthesia Physical  Anesthesia Plan  ASA: III  Anesthesia Plan: MAC   Post-op Pain Management:    Induction: Intravenous  PONV Risk Score and Plan: 4 or greater and Ondansetron, Dexamethasone, Midazolam and Scopolamine patch - Pre-op  Airway Management Planned: Natural Airway and Nasal Cannula  Additional Equipment: None  Intra-op Plan:   Post-operative Plan:   Informed Consent: I have reviewed the patients History and Physical, chart, labs and discussed the procedure including the risks, benefits and alternatives for the proposed anesthesia with the patient or authorized representative who has indicated his/her understanding and acceptance.     Dental advisory  given  Plan Discussed with: CRNA and Anesthesiologist  Anesthesia Plan Comments: (Echo:  2. The left ventricular ejection fraction, by estimation, is 40 to 45%.  The left ventricle has mildly decreased function.  3. Based on limited views, there is severe hypokinesis of all septal  segments, the mid-to-apical anterior LV segments, and apex.  4. There is mild asymmetric left ventricular hypertrophy of the posterior  LV segment.  5. Left ventricular diastolic parameters are indeterminate due to  arrhythmia. Elevated left atrial pressure.  6. Right ventricular systolic function is mildly reduced. The right  ventricular size is normal.  7. The mitral valve is normal in structure. Mild mitral valve  regurgitation.  8. The aortic valve is tricuspid. Aortic valve regurgitation is not  visualized. No aortic stenosis is present. )        Anesthesia Quick Evaluation

## 2020-12-20 NOTE — Anesthesia Postprocedure Evaluation (Signed)
Anesthesia Post Note  Patient: Tiffany Le  Procedure(s) Performed: STERNAL WOUND DEBRIDEMENT WITH SKIN SUBSTITUTION AND ABRA (N/A Chest)     Patient location during evaluation: PACU Anesthesia Type: MAC Level of consciousness: awake and alert Pain management: pain level controlled Vital Signs Assessment: post-procedure vital signs reviewed and stable Respiratory status: spontaneous breathing, nonlabored ventilation, respiratory function stable and patient connected to nasal cannula oxygen Cardiovascular status: stable and blood pressure returned to baseline Postop Assessment: no apparent nausea or vomiting Anesthetic complications: no   No complications documented.  Last Vitals:  Vitals:   12/20/20 1255 12/20/20 1310  BP: 115/77 111/67  Pulse: 89 92  Resp: 17 18  Temp:    SpO2: 94% 93%    Last Pain:  Vitals:   12/20/20 1240  TempSrc:   PainSc: 0-No pain                 Ring[place],Kostas Marrow A.

## 2020-12-20 NOTE — Progress Notes (Addendum)
Upon the pt arriving to Short Stay for surgery, the pt stated that she was given 1 cup of orange juice at 0600 for a low blood sugar and then given another cup approximately 151ml at 0830 by her nurse Carmell Austria. Dr. Royce Macadamia with anesthesia and Dr. Marla Roe made aware. Carmell Austria, RN made aware that if a pt is scheduled for a surgical procedure and the blood sugar is low, that IV glucose should be given and not to give citric beverages to bring up the levels.

## 2020-12-20 NOTE — Consult Note (Signed)
[facility]  Reason for Consult: Sternal wound Referring Physician: Dr. Atkins  Tiffany Le is an 45 y.o. female.  HPI: 45-year-old female currently hospitalized for sternal wound.  She has a past medical history significant for morbid obesity, coronary artery disease, CVA, poorly controlled type II diabetes mellitus, NSTEMI, pulmonary embolism.  Patient underwent CABG x4 per Dr. Atkins on 10/09/2020.  Left internal mammary artery and pedicled right internal mammary artery graft were used.  Patient subsequently developed sternal wound dehiscence.  She was readmitted on 12/14/2020.  She underwent excisional debridement of sternal wound and application of wound VAC on 12/14/2020 with Dr. Owen.  2 days ago she underwent sternal wound debridement, wound VAC change and application of biological substrate with Dr. Atkins  Plastic surgery was consulted for evaluation of sternal wound.  Patient reports today she is feeling nauseous.  She reports that overall she does not feel very well.  She denies any chest pains or shortness of breath.  She reports she is very thirsty.   Past Medical History:  Diagnosis Date  . Coronary artery disease   . CVA (cerebral vascular accident) (HCC) 09/2020  . Diabetes mellitus without complication (HCC)   . Diabetic neuropathy (HCC) 02/2020  . 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  . Hyperlipidemia age 36  . Hypertension age 33  . Hypocalcemia 11/2019  . Hypokalemia 11/2019  . Iron deficiency anemia   . NSTEMI (non-ST elevated myocardial infarction) (HCC) 09/2020  . Obesity   . Proteinuria 12/09/2019  . Pulmonary embolism (HCC) 09/2020    Past Surgical History:  Procedure Laterality Date  . APPLICATION OF WOUND VAC N/A 12/14/2020   Procedure: APPLICATION OF WOUND VAC;  Surgeon: Owen, Clarence H, MD;  [facility];  Service: Thoracic;  Laterality: N/A;  . CARDIAC CATHETERIZATION  09/25/2020  . CESAREAN SECTION  3/96  . CLIPPING OF ATRIAL APPENDAGE N/A 10/09/2020    Procedure: CLIPPING OF ATRIAL APPENDAGE USING ATRICURE 35MM CLIP;  Surgeon: Atkins, Broadus Z, MD;  [facility];  Service: Open Heart Surgery;  Laterality: N/A;  . CORONARY ARTERY BYPASS GRAFT N/A 10/09/2020   Procedure: CORONARY ARTERY BYPASS GRAFTING (CABG), ON PUMP, TIMES FOUR, USING LEFT INTERNAL MAMMARY ARTERY AND LEFT RADIAL ARTERY (OPEN HARVEST);  Surgeon: Atkins, Broadus Z, MD;  [facility];  Service: Open Heart Surgery;  Laterality: N/A;  POSSIBLE BIMA  . IR THORACENTESIS ASP PLEURAL SPACE W/IMG GUIDE  10/16/2020  . LEFT HEART CATH AND CORONARY ANGIOGRAPHY N/A 09/25/2020   Procedure: LEFT HEART CATH AND CORONARY ANGIOGRAPHY;  Surgeon: Harding, David W, MD;  [facility];  Service: Cardiovascular;  Laterality: N/A;  . ORIF FEMUR FRACTURE    . RADIAL ARTERY HARVEST Left 10/09/2020   Procedure: RADIAL ARTERY HARVEST;  Surgeon: Atkins, Broadus Z, MD;  [facility];  Service: Open Heart Surgery;  Laterality: Left;  . STERNAL WOUND DEBRIDEMENT N/A 12/14/2020   Procedure: STERNAL WOUND DEBRIDEMENT;  Surgeon: Owen, Clarence H, MD;  [facility];  Service: Thoracic;  Laterality: N/A;  . STERNAL WOUND DEBRIDEMENT N/A 12/18/2020   Procedure: STERNAL WOUND DEBRIDEMENT, Wound Vac Change;  Surgeon: Atkins, Broadus Z, MD;  [facility];  Service: Thoracic;  Laterality: N/A;  . TEE WITHOUT CARDIOVERSION N/A 10/09/2020   Procedure: TRANSESOPHAGEAL ECHOCARDIOGRAM (TEE);  Surgeon: Atkins, Broadus Z, MD;  [facility];  Service: Open Heart Surgery;  Laterality: N/A;    Family History  Problem Relation Age of Onset  . Heart   disease Mother        CABG <50  . Hypertension Mother   . Diabetes Mother   . Hyperlipidemia Mother   . Heart disease Father   . Hyperlipidemia Father   . Hypertension Father   . Early death Father   . Healthy Daughter   . Diabetes Maternal Aunt   . Cancer Maternal Grandmother        stomach cancer  . Diabetes Maternal Aunt   . Cancer Cousin         colon cancer (dx'd 21)    Social History:  reports that she has never smoked. She has never used smokeless tobacco. She reports that she does not drink alcohol and does not use drugs.  Allergies:  Allergies  Allergen Reactions  . Penicillins Rash    Historical Has patient had a PCN reaction causing immediate rash, facial/tongue/throat swelling, SOB or lightheadedness with hypotension: No Has patient had a PCN reaction causing severe rash involving mucus membranes or skin necrosis: No Has patient had a PCN reaction that required hospitalization: No Has patient had a PCN reaction occurring within the last 10 years: No If all of the above answers are "NO", then may proceed with Cephalosporin use. Has tolerated cephalosporins    Medications: I have reviewed the patient's current medications.  Results for orders placed or performed during the hospital encounter of 12/14/20 (from the past 48 hour(s))  Glucose, capillary     Status: None   Collection Time: 12/18/20  8:55 AM  Result Value Ref Range   Glucose-Capillary 81 70 - 99 mg/dL    Comment: Glucose reference range applies only to samples taken after fasting for at least 8 hours.  Glucose, capillary     Status: None   Collection Time: 12/18/20 11:20 AM  Result Value Ref Range   Glucose-Capillary 96 70 - 99 mg/dL    Comment: Glucose reference range applies only to samples taken after fasting for at least 8 hours.  Glucose, capillary     Status: Abnormal   Collection Time: 12/18/20  3:56 PM  Result Value Ref Range   Glucose-Capillary 299 (H) 70 - 99 mg/dL    Comment: Glucose reference range applies only to samples taken after fasting for at least 8 hours.  Glucose, capillary     Status: Abnormal   Collection Time: 12/18/20  9:28 PM  Result Value Ref Range   Glucose-Capillary 319 (H) 70 - 99 mg/dL    Comment: Glucose reference range applies only to samples taken after fasting for at least 8 hours.  Glucose, capillary     Status:  None   Collection Time: 12/19/20  5:44 AM  Result Value Ref Range   Glucose-Capillary 94 70 - 99 mg/dL    Comment: Glucose reference range applies only to samples taken after fasting for at least 8 hours.  Glucose, capillary     Status: Abnormal   Collection Time: 12/19/20 11:48 AM  Result Value Ref Range   Glucose-Capillary 105 (H) 70 - 99 mg/dL    Comment: Glucose reference range applies only to samples taken after fasting for at least 8 hours.  Glucose, capillary     Status: Abnormal   Collection Time: 12/19/20  4:41 PM  Result Value Ref Range   Glucose-Capillary 167 (H) 70 - 99 mg/dL    Comment: Glucose reference range applies only to samples taken after fasting for at least 8 hours.  Glucose, capillary     Status: Abnormal  Collection Time: 12/19/20  9:21 PM  Result Value Ref Range   Glucose-Capillary 132 (H) 70 - 99 mg/dL    Comment: Glucose reference range applies only to samples taken after fasting for at least 8 hours.  Glucose, capillary     Status: None   Collection Time: 12/20/20  5:35 AM  Result Value Ref Range   Glucose-Capillary 78 70 - 99 mg/dL    Comment: Glucose reference range applies only to samples taken after fasting for at least 8 hours.    No results found.  Review of Systems  Constitutional: Positive for malaise/fatigue. Negative for chills and fever.  Respiratory: Negative for shortness of breath.   Cardiovascular: Negative for chest pain.  Gastrointestinal: Positive for nausea. Negative for vomiting.  Neurological: Positive for weakness. Negative for speech change and focal weakness.   Blood pressure 132/62, pulse 88, temperature 99.4 F (37.4 C), temperature source Oral, resp. rate 18, height 5' 4" (1.626 m), weight 107.9 kg, SpO2 96 %. Physical Exam Constitutional:      General: She is not in acute distress.    Appearance: Normal appearance. She is obese. She is ill-appearing.  HENT:     Head: Normocephalic and atraumatic.  Pulmonary:      Effort: Pulmonary effort is normal.  Chest:    Skin:    General: Skin is warm and dry.       Neurological:     General: No focal deficit present.     Mental Status: She is alert and oriented to person, place, and time.  Psychiatric:        Mood and Affect: Mood normal.        Behavior: Behavior normal.     Assessment/Plan:  Spoke with patient this morning in regards to her sternal wound.  Discussed plan for return to the operating room today for wound VAC change, possible application of additional wound matrix.  All of the patient's questions were answered to her content.  We discussed risks associated with the scheduled procedure.  Appreciate consultation.   Nickey Kloepfer J Tarhonda Hollenberg, PA-C 12/20/2020, 8:26 AM      

## 2020-12-20 NOTE — H&P (View-Only) (Signed)
[facility]  Reason for Consult: Sternal wound Referring Physician: Dr. Elson Clan is an 45 y.o. female.  HPI: 45 year old female currently hospitalized for sternal wound.  She has a past medical history significant for morbid obesity, coronary artery disease, CVA, poorly controlled type II diabetes mellitus, NSTEMI, pulmonary embolism.  Patient underwent CABG x4 per Dr. Orvan Seen on 10/09/2020.  Left internal mammary artery and pedicled right internal mammary artery graft were used.  Patient subsequently developed sternal wound dehiscence.  She was readmitted on 12/14/2020.  She underwent excisional debridement of sternal wound and application of wound VAC on 12/14/2020 with Dr. Roxy Manns.  2 days ago she underwent sternal wound debridement, wound VAC change and application of biological substrate with Dr. Orvan Seen  Plastic surgery was consulted for evaluation of sternal wound.  Patient reports today she is feeling nauseous.  She reports that overall she does not feel very well.  She denies any chest pains or shortness of breath.  She reports she is very thirsty.   Past Medical History:  Diagnosis Date  . Coronary artery disease   . CVA (cerebral vascular accident) ([place]) 09/2020  . Diabetes mellitus without complication ([place])   . Diabetic neuropathy ([place]) 02/2020  . 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  . Hyperlipidemia age 7  . Hypertension age 55  . Hypocalcemia 11/2019  . Hypokalemia 11/2019  . Iron deficiency anemia   . NSTEMI (non-ST elevated myocardial infarction) ([place]) 09/2020  . Obesity   . Proteinuria 12/09/2019  . Pulmonary embolism ([place]) 09/2020    Past Surgical History:  Procedure Laterality Date  . APPLICATION OF WOUND VAC N/A 12/14/2020   Procedure: APPLICATION OF WOUND VAC;  Surgeon: Rexene Alberts, MD;  Location: [place];  Service: Thoracic;  Laterality: N/A;  . CARDIAC CATHETERIZATION  09/25/2020  . CESAREAN SECTION  3/96  . CLIPPING OF ATRIAL APPENDAGE N/A 10/09/2020    Procedure: CLIPPING OF ATRIAL APPENDAGE USING ATRICURE 35MM CLIP;  Surgeon: [name], MD;  Location: [place];  Service: Open Heart Surgery;  Laterality: N/A;  . CORONARY ARTERY BYPASS GRAFT N/A 10/09/2020   Procedure: CORONARY ARTERY BYPASS GRAFTING (CABG), ON PUMP, TIMES FOUR, USING LEFT INTERNAL MAMMARY ARTERY AND LEFT RADIAL ARTERY (OPEN HARVEST);  Surgeon: [name], MD;  Location: [place];  Service: Open Heart Surgery;  Laterality: N/A;  POSSIBLE BIMA  . IR THORACENTESIS ASP PLEURAL SPACE W/IMG GUIDE  10/16/2020  . LEFT HEART CATH AND CORONARY ANGIOGRAPHY N/A 09/25/2020   Procedure: LEFT HEART CATH AND CORONARY ANGIOGRAPHY;  Surgeon: Leonie Man, MD;  Location: [place] CV LAB;  Service: Cardiovascular;  Laterality: N/A;  . ORIF FEMUR FRACTURE    . RADIAL ARTERY HARVEST Left 10/09/2020   Procedure: RADIAL ARTERY HARVEST;  Surgeon: [name], MD;  Location: [place];  Service: Open Heart Surgery;  Laterality: Left;  . STERNAL WOUND DEBRIDEMENT N/A 12/14/2020   Procedure: STERNAL WOUND DEBRIDEMENT;  Surgeon: Rexene Alberts, MD;  Location: [place];  Service: Thoracic;  Laterality: N/A;  . STERNAL WOUND DEBRIDEMENT N/A 12/18/2020   Procedure: STERNAL WOUND DEBRIDEMENT, Wound Vac Change;  Surgeon: [name], MD;  [facility];  Service: Thoracic;  Laterality: N/A;  . TEE WITHOUT CARDIOVERSION N/A 10/09/2020   Procedure: TRANSESOPHAGEAL ECHOCARDIOGRAM (TEE);  Surgeon: [name], MD;  Location: [place];  Service: Open Heart Surgery;  Laterality: N/A;    Family History  Problem Relation Age of Onset  . Heart  disease Mother        CABG <50  . Hypertension Mother   . Diabetes Mother   . Hyperlipidemia Mother   . Heart disease Father   . Hyperlipidemia Father   . Hypertension Father   . Early death Father   . Healthy Daughter   . Diabetes Maternal Aunt   . Cancer Maternal Grandmother        stomach cancer  . Diabetes Maternal Aunt   . Cancer Cousin         colon cancer (dx'd 21)    Social History:  reports that she has never smoked. She has never used smokeless tobacco. She reports that she does not drink alcohol and does not use drugs.  Allergies:  Allergies  Allergen Reactions  . Penicillins Rash    Historical Has patient had a PCN reaction causing immediate rash, facial/tongue/throat swelling, SOB or lightheadedness with hypotension: No Has patient had a PCN reaction causing severe rash involving mucus membranes or skin necrosis: No Has patient had a PCN reaction that required hospitalization: No Has patient had a PCN reaction occurring within the last 10 years: No If all of the above answers are "NO", then may proceed with Cephalosporin use. Has tolerated cephalosporins    Medications: I have reviewed the patient's current medications.  Results for orders placed or performed during the hospital encounter of 12/14/20 (from the past 48 hour(s))  Glucose, capillary     Status: None   Collection Time: 12/18/20  8:55 AM  Result Value Ref Range   Glucose-Capillary 81 70 - 99 mg/dL    Comment: Glucose reference range applies only to samples taken after fasting for at least 8 hours.  Glucose, capillary     Status: None   Collection Time: 12/18/20 11:20 AM  Result Value Ref Range   Glucose-Capillary 96 70 - 99 mg/dL    Comment: Glucose reference range applies only to samples taken after fasting for at least 8 hours.  Glucose, capillary     Status: Abnormal   Collection Time: 12/18/20  3:56 PM  Result Value Ref Range   Glucose-Capillary 299 (H) 70 - 99 mg/dL    Comment: Glucose reference range applies only to samples taken after fasting for at least 8 hours.  Glucose, capillary     Status: Abnormal   Collection Time: 12/18/20  9:28 PM  Result Value Ref Range   Glucose-Capillary 319 (H) 70 - 99 mg/dL    Comment: Glucose reference range applies only to samples taken after fasting for at least 8 hours.  Glucose, capillary     Status:  None   Collection Time: 12/19/20  5:44 AM  Result Value Ref Range   Glucose-Capillary 94 70 - 99 mg/dL    Comment: Glucose reference range applies only to samples taken after fasting for at least 8 hours.  Glucose, capillary     Status: Abnormal   Collection Time: 12/19/20 11:48 AM  Result Value Ref Range   Glucose-Capillary 105 (H) 70 - 99 mg/dL    Comment: Glucose reference range applies only to samples taken after fasting for at least 8 hours.  Glucose, capillary     Status: Abnormal   Collection Time: 12/19/20  4:41 PM  Result Value Ref Range   Glucose-Capillary 167 (H) 70 - 99 mg/dL    Comment: Glucose reference range applies only to samples taken after fasting for at least 8 hours.  Glucose, capillary     Status: Abnormal  Collection Time: 12/19/20  9:21 PM  Result Value Ref Range   Glucose-Capillary 132 (H) 70 - 99 mg/dL    Comment: Glucose reference range applies only to samples taken after fasting for at least 8 hours.  Glucose, capillary     Status: None   Collection Time: 12/20/20  5:35 AM  Result Value Ref Range   Glucose-Capillary 78 70 - 99 mg/dL    Comment: Glucose reference range applies only to samples taken after fasting for at least 8 hours.    No results found.  Review of Systems  Constitutional: Positive for malaise/fatigue. Negative for chills and fever.  Respiratory: Negative for shortness of breath.   Cardiovascular: Negative for chest pain.  Gastrointestinal: Positive for nausea. Negative for vomiting.  Neurological: Positive for weakness. Negative for speech change and focal weakness.   Blood pressure 132/62, pulse 88, temperature 99.4 F (37.4 C), temperature source Oral, resp. rate 18, height 5\' 4"  (1.626 m), weight 107.9 kg, SpO2 96 %. Physical Exam Constitutional:      General: She is not in acute distress.    Appearance: Normal appearance. She is obese. She is ill-appearing.  HENT:     Head: Normocephalic and atraumatic.  Pulmonary:      Effort: Pulmonary effort is normal.  Chest:    Skin:    General: Skin is warm and dry.       Neurological:     General: No focal deficit present.     Mental Status: She is alert and oriented to person, place, and time.  Psychiatric:        Mood and Affect: Mood normal.        Behavior: Behavior normal.     Assessment/Plan:  Spoke with patient this morning in regards to her sternal wound.  Discussed plan for return to the operating room today for wound VAC change, possible application of additional wound matrix.  All of the patient's questions were answered to her content.  We discussed risks associated with the scheduled procedure.  Appreciate consultation.   Carola Rhine Caleb Prigmore, PA-C 12/20/2020, 8:26 AM

## 2020-12-20 NOTE — Progress Notes (Signed)
      [address]       [place] [address]             [phone]      2 Days Post-Op Procedure(s) (LRB): STERNAL WOUND DEBRIDEMENT, Wound Vac Change (N/A) Subjective: Feels fatigued and weak today  Objective: Vital signs in last 24 hours: Temp:  [98.3 F (36.8 C)-99.4 F (37.4 C)] 99.4 F (37.4 C) (05/12 0722) Pulse Rate:  [85-89] 88 (05/12 0100) Cardiac Rhythm: Normal sinus rhythm (05/11 1900) Resp:  [16-20] 18 (05/12 0722) BP: (98-132)/(59-70) 132/62 (05/12 0722) SpO2:  [96 %-99 %] 96 % (05/12 0722)  Hemodynamic parameters for last 24 hours:    Intake/Output from previous day: 05/11 0701 - 05/12 0700 In: 480 [P.O.:480] Out: 50 [Drains:50] Intake/Output this shift: Total I/O In: -  Out: 50 [Drains:50]  Wound: stable with VAC in place  Lab Results: No results for input(s): WBC, HGB, HCT, PLT in the last 72 hours. BMET: No results for input(s): NA, K, CL, CO2, GLUCOSE, BUN, CREATININE, CALCIUM in the last 72 hours.  PT/INR: No results for input(s): LABPROT, INR in the last 72 hours. ABG    Component Value Date/Time   PHART 7.364 10/09/2020 2158   HCO3 23.9 10/09/2020 2158   TCO2 24 12/14/2020 1629   ACIDBASEDEF 1.0 10/09/2020 2158   O2SAT 59.6 10/27/2020 0530   CBG (last 3)  Recent Labs    12/19/20 1641 12/19/20 2121 12/20/20 0535  GLUCAP 167* 132* 78    Meds Scheduled Meds: . aspirin EC  81 mg Oral Daily  . atorvastatin  80 mg Oral Daily  . bisacodyl  10 mg Oral Daily  . carvedilol  6.25 mg Oral BID WC  . dapagliflozin propanediol  10 mg Oral Daily  . enoxaparin (LOVENOX) injection  40 mg Subcutaneous Q24H  . gabapentin  100 mg Oral TID  . glipiZIDE  10 mg Oral QAC breakfast  . insulin aspart  0-24 Units Subcutaneous TID AC & HS  . insulin glargine  8 Units Subcutaneous BID  . isosorbide-hydrALAZINE  1 tablet Oral TID  . multivitamin with minerals  1 tablet Oral Daily  . sacubitril-valsartan  1 tablet Oral BID  .  senna-docusate  1 tablet Oral QHS  . spironolactone  25 mg Oral Daily  . torsemide  40 mg Oral Daily   Continuous Infusions: . sodium chloride 250 mL (12/15/20 0109)  . ceFEPime (MAXIPIME) IV 2 g (12/20/20 0615)   PRN Meds:.sodium chloride, acetaminophen, albuterol, ondansetron (ZOFRAN) IV, oxyCODONE, traMADol  Xrays No results found.  Assessment/Plan: S/P Procedure(s) (LRB): STERNAL WOUND DEBRIDEMENT, Wound Vac Change (N/A)  1 afeb, VSS SBP 90's to 130's, sinus rhythm, sats good on RA 2 BS stable 70's to 160's 3 plastics has her scheduled for OR today    LOS: 6 days    [name] PA-C  12/20/2020

## 2020-12-20 NOTE — Interval H&P Note (Signed)
History and Physical Interval Note:  12/20/2020 11:31 AM  Tiffany Le  has presented today for surgery, with the diagnosis of Sternal Wound.  The various methods of treatment have been discussed with the patient and family. After consideration of risks, benefits and other options for treatment, the patient has consented to  Procedure(s): STERNAL WOUND DEBRIDEMENT WITH SKIN SUBSTITUTION AND ABRA (N/A) as a surgical intervention.  The patient's history has been reviewed, patient examined, no change in status, stable for surgery.  I have reviewed the patient's chart and labs.  Questions were answered to the patient's satisfaction.     Loel Lofty Annabel Gibeau

## 2020-12-20 NOTE — Op Note (Signed)
DATE OF OPERATION: 12/20/2020  LOCATION: [facility] In[facility]  PREOPERATIVE DIAGNOSIS: sternal wound   POSTOPERATIVE DIAGNOSIS: Same  PROCEDURE: preparation of sternal wound 10 x 16 cm for placement of Acell 10 x 15 cm sheet, 1 gm powder, VAC and ABRA  SURGEON: Genice Kimberlin H. J. Heinz, DO  ASSISTANT: Roetta Sessions, ,PA  EBL: none  CONDITION: Stable  COMPLICATIONS: None  INDICATION: The patient, Tiffany Le, is a 45 y.o. female born on 11/01/1975, is here for treatment of a sternal wound.   PROCEDURE DETAILS:  The patient was seen prior to surgery and marked.  The IV antibiotics were given. The patient was taken to the operating room and given a general anesthetic. A standard time out was performed and all information was confirmed by those in the room. SCDs were placed.   The chest was prepped and draped.  The wound was cleaned 10 x 16 cm.  All of the Acell powder and sheet were applied.  The sheet was covered with the sorbact.  KY gel was applied. The VAC was placed.  There was an excellent seal.  The ABRA device was placed.  The patient was allowed to wake up and taken to recovery room in stable condition at the end of the case. The family was notified at the end of the case.   The advanced practice practitioner (APP) assisted throughout the case.  The APP was essential in retraction and counter traction when needed to make the case progress smoothly.  This retraction and assistance made it possible to see the tissue plans for the procedure.  The assistance was needed for blood control, tissue re-approximation and assisted with closure of the incision site.

## 2020-12-20 NOTE — Transfer of Care (Signed)
Immediate Anesthesia Transfer of Care Note  Patient: Tiffany Le  Procedure(s) Performed: STERNAL WOUND DEBRIDEMENT WITH SKIN SUBSTITUTION AND ABRA (N/A )  Patient Location: PACU  Anesthesia Type:MAC  Level of Consciousness: awake, alert  and oriented  Airway & Oxygen Therapy: Patient Spontanous Breathing and Patient connected to face mask oxygen  Post-op Assessment: Report given to RN and Post -op Vital signs reviewed and stable  Post vital signs: Reviewed and stable  Last Vitals:  Vitals Value Taken Time  BP 112/69 12/20/20 1225  Temp    Pulse 90 12/20/20 1226  Resp 4 12/20/20 1226  SpO2 95 % 12/20/20 1226  Vitals shown include unvalidated device data.  Last Pain:  Vitals:   12/20/20 1133  TempSrc:   PainSc: 0-No pain      Patients Stated Pain Goal: 3 (20/94/70 9628)  Complications: No complications documented.

## 2020-12-20 NOTE — Interval H&P Note (Signed)
History and Physical Interval Note:  12/20/2020 11:31 AM  Tiffany Le  has presented today for surgery, with the diagnosis of Sternal Wound.  The various methods of treatment have been discussed with the patient and family. After consideration of risks, benefits and other options for treatment, the patient has consented to  Procedure(s): STERNAL WOUND DEBRIDEMENT WITH SKIN SUBSTITUTION AND ABRA (N/A) as a surgical intervention.  The patient's history has been reviewed, patient examined, no change in status, stable for surgery.  I have reviewed the patient's chart and labs.  Questions were answered to the patient's satisfaction.     Jay Kempe S Coraima Tibbs   

## 2020-12-21 ENCOUNTER — Other Ambulatory Visit: Payer: Self-pay

## 2020-12-21 ENCOUNTER — Other Ambulatory Visit: Payer: Self-pay | Admitting: *Deleted

## 2020-12-21 ENCOUNTER — Encounter (HOSPITAL_COMMUNITY): Payer: Self-pay | Admitting: Plastic Surgery

## 2020-12-21 LAB — GLUCOSE, CAPILLARY
Glucose-Capillary: 89 mg/dL (ref 70–99)
Glucose-Capillary: 107 mg/dL — ABNORMAL HIGH (ref 70–99)
Glucose-Capillary: 76 mg/dL (ref 70–99)
Glucose-Capillary: 124 mg/dL — ABNORMAL HIGH (ref 70–99)

## 2020-12-21 MED ORDER — SODIUM CHLORIDE 0.9% FLUSH
10.0000 mL | Freq: Two times a day (BID) | INTRAVENOUS | Status: DC
  Administered 2020-12-21 – 2020-12-28 (×12): 10 mL

## 2020-12-21 MED ORDER — SODIUM CHLORIDE 0.9% FLUSH: 10.0000 mL | INTRAVENOUS | Status: DC | PRN

## 2020-12-21 NOTE — Plan of Care (Signed)
  Problem: Education: Goal: Knowledge of General Education information will improve Description: Including pain rating scale, medication(s)/side effects and non-pharmacologic comfort measures 12/21/2020 1343 by Thressa Sheller, RN Outcome: Progressing 12/21/2020 1343 by Thressa Sheller, RN Outcome: Progressing   Problem: Health Behavior/Discharge Planning: Goal: Ability to manage health-related needs will improve 12/21/2020 1343 by Thressa Sheller, RN Outcome: Progressing 12/21/2020 1343 by Thressa Sheller, RN Outcome: Progressing   Problem: Clinical Measurements: Goal: Ability to maintain clinical measurements within normal limits will improve Outcome: Progressing Goal: Will remain free from infection Outcome: Progressing Goal: Diagnostic test results will improve Outcome: Progressing Goal: Respiratory complications will improve Outcome: Progressing Goal: Cardiovascular complication will be avoided 12/21/2020 1343 by Thressa Sheller, RN Outcome: Progressing 12/21/2020 1343 by Thressa Sheller, RN Outcome: Progressing   Problem: Activity: Goal: Risk for activity intolerance will decrease 12/21/2020 1343 by Thressa Sheller, RN Outcome: Progressing 12/21/2020 1343 by Thressa Sheller, RN Outcome: Progressing   Problem: Nutrition: Goal: Adequate nutrition will be maintained 12/21/2020 1343 by Thressa Sheller, RN Outcome: Progressing 12/21/2020 1343 by Thressa Sheller, RN Outcome: Progressing   Problem: Coping: Goal: Level of anxiety will decrease Outcome: Progressing   Problem: Elimination: Goal: Will not experience complications related to bowel motility Outcome: Progressing Goal: Will not experience complications related to urinary retention Outcome: Progressing   Problem: Pain Managment: Goal: General experience of comfort will improve Outcome: Progressing   Problem: Safety: Goal: Ability to remain free from injury will improve Outcome:  Progressing   Problem: Skin Integrity: Goal: Risk for impaired skin integrity will decrease 12/21/2020 1343 by Thressa Sheller, RN Outcome: Progressing 12/21/2020 1343 by Thressa Sheller, RN Outcome: Progressing

## 2020-12-21 NOTE — Patient Outreach (Addendum)
[facility]) Care Management  12/21/2020  Tiffany Le [birth year] [id number]   [facility] care coordination for noted hospitalized [name] was referred to [facility] on 11/07/20 for EMMI stroke For RED ON EMMI ALERT Day #9Date: 11/06/20 1000 Tuesday Red Alert Reason: Sad, hopeless, anxious, or empty? Yes Issued smoking cessation, thinking about quitting smoking and stroke preventing secondary stroke EMMIs  Insurance:self pay, Medicaid pending- Not on [facility] admissions x3ED visits x 3in the last 6 months  [facility] admissions 12/14/20 top present congestive Heart Failure (CHF) 10/02/20 to 10/27/20 pulmonary edema with status post (s/p) Coronary artery bypass graft (CABG) x 4 on 10/09/20 09/24/20 to 09/27/20 Non ST elevation myocardial infarction (NSTEMI), Hypertension (HTN), uncontrolled Diabetes (DM) type 2without long term current use of insulin    Engagement and services for patient  2 unsuccessful outreaches to patient occurred Pt was reached successful on the third outreach on 11/26/20 in which the red alert reason was addressed. [name] on the day of the [facility] outreach as she was having difficulty obtaining her discharge medications. Pt reported the reason was resolved after calls to the hospital and a visit to her pcp  She denied need of [facility] SW and other resources. Her depression screen on 11/26/20 PHQ2= 1  There was a scheduled follow call attempt to the pt on 12/11/20 that was unsuccessful  Prior to 12/17/20 scheduled outreach, [facility] RN CM noted an admission on 12/14/20 On 12/17/20 [facility] RN CM collaborated with pcp SW, Tiffany Le  Discussed possible pt financial concerns (medications, health coverage/pending medicaid, self pay co pays) that may be driving the continued medical care of this patient Tiffany Le confirms patient is engaged also with  CM/SW staff at heart care center as the staff member has  also reached out to [place] Pt reported to be changing pcp staff at [employer] office Tiffany Le leaving)  To see Tiffany Le January 31 2021 per pcp SW    Prior to 12/21/20 outreach, [facility] RN CM notes pt continues to be hospitalized Review of EPIC noted indicate patient had 12/20/20 STERNAL WOUND DEBRIDEMENT WITH SKIN SUBSTITUTION AND ABRA (N/A Chest   Plan: [facility] RN CM scheduled this patient for another call attempt within 4-7 business days pending possible hospital discharge  Note to pcp  Goals Addressed              This Visit's Progress     Patient Stated   .  [facility]) Decrease inpatient admissions/ readmissions (pt-stated)        Timeframe:  Short Range Goal Priority:  High Start Date:                11/26/20             Expected End Date:  12/26/20                     Follow Up Date 12/21/20  Work with pcp and cardiac center staff on barriers to home care and for admission prevention   Notes 12/21/20 remains hospitalized s/p sternal wound debridement with skin substitution and abra on 12/20/20  12/17/20 Re admission noted on 12/14/20 for CHF. Collaboration with pcp SW Tiffany Le 12/11/20 unsuccessful EMMI stroke follow up attempt 11/26/20 successful outreach denied depression s/s related to cva. Confirmed related to filling medications but resolved after pcp visit She denied need of [facility] SW and other resources. Her depression screen on  11/26/20 PHQ2= 1        Tiffany Le L. [name], RN, BSN, [place] Coordinator Office number [phone] [facility] number [phone] Fax number [phone]

## 2020-12-21 NOTE — Progress Notes (Signed)
      [address]       [place] [address]             [phone]      1 Day Post-Op Procedure(s) (LRB): STERNAL WOUND DEBRIDEMENT WITH SKIN SUBSTITUTION AND ABRA (N/A) Subjective: In excellent spirits this am  Objective: Vital signs in last 24 hours: Temp:  [98.1 F (36.7 C)-99.6 F (37.6 C)] 99.6 F (37.6 C) (05/13 0724) Pulse Rate:  [88-99] 94 (05/13 0724) Cardiac Rhythm: Normal sinus rhythm (05/12 1900) Resp:  [12-20] 20 (05/13 0724) BP: (105-155)/(53-77) 105/65 (05/13 0724) SpO2:  [93 %-100 %] 100 % (05/13 0724)  Hemodynamic parameters for last 24 hours:    Intake/Output from previous day: 05/12 0701 - 05/13 0700 In: 740 [P.O.:240; I.V.:500] Out: 50 [Drains:50] Intake/Output this shift: No intake/output data recorded.  Wound: dressing in place  Lab Results: No results for input(s): WBC, HGB, HCT, PLT in the last 72 hours. BMET: No results for input(s): NA, K, CL, CO2, GLUCOSE, BUN, CREATININE, CALCIUM in the last 72 hours.  PT/INR: No results for input(s): LABPROT, INR in the last 72 hours. ABG    Component Value Date/Time   PHART 7.364 10/09/2020 2158   HCO3 23.9 10/09/2020 2158   TCO2 24 12/14/2020 1629   ACIDBASEDEF 1.0 10/09/2020 2158   O2SAT 59.6 10/27/2020 0530   CBG (last 3)  Recent Labs    12/20/20 1614 12/20/20 2117 12/21/20 0532  GLUCAP 160* 132* 89    Meds Scheduled Meds: . aspirin EC  81 mg Oral Daily  . atorvastatin  80 mg Oral Daily  . bisacodyl  10 mg Oral Daily  . carvedilol  6.25 mg Oral BID WC  . dapagliflozin propanediol  10 mg Oral Daily  . enoxaparin (LOVENOX) injection  40 mg Subcutaneous Q24H  . gabapentin  100 mg Oral TID  . glipiZIDE  10 mg Oral QAC breakfast  . insulin aspart  0-24 Units Subcutaneous TID AC & HS  . insulin glargine  8 Units Subcutaneous BID  . isosorbide-hydrALAZINE  1 tablet Oral TID  . multivitamin with minerals  1 tablet Oral Daily  . sacubitril-valsartan  1 tablet Oral BID   . senna-docusate  1 tablet Oral QHS  . spironolactone  25 mg Oral Daily  . torsemide  40 mg Oral Daily   Continuous Infusions: . sodium chloride 250 mL (12/15/20 0109)  . ceFEPime (MAXIPIME) IV 2 g (12/21/20 0635)   PRN Meds:.sodium chloride, acetaminophen, albuterol, ondansetron (ZOFRAN) IV, oxyCODONE, traMADol  Xrays No results found.  Assessment/Plan: S/P Procedure(s) (LRB): STERNAL WOUND DEBRIDEMENT WITH SKIN SUBSTITUTION AND ABRA (N/A)   1 tmax 99.6, VSS SBP 110's to 150's, mostly good range 2 sats good on RA 3 BS control is improved 4 no new labs- will obtain for am 6 ABRA device and wound management as per plastics   LOS: 7 days    [name] PA-C Pager [phone] 12/21/2020

## 2020-12-21 NOTE — Plan of Care (Signed)
  Problem: Education: Goal: Knowledge of General Education information will improve Description: Including pain rating scale, medication(s)/side effects and non-pharmacologic comfort measures Outcome: Progressing   Problem: Health Behavior/Discharge Planning: Goal: Ability to manage health-related needs will improve Outcome: Progressing   Problem: Clinical Measurements: Goal: Ability to maintain clinical measurements within normal limits will improve Outcome: Progressing   Problem: Clinical Measurements: Goal: Diagnostic test results will improve Outcome: Progressing   Problem: Clinical Measurements: Goal: Cardiovascular complication will be avoided Outcome: Progressing   Problem: Activity: Goal: Risk for activity intolerance will decrease Outcome: Progressing   Problem: Nutrition: Goal: Adequate nutrition will be maintained Outcome: Progressing   Problem: Coping: Goal: Level of anxiety will decrease Outcome: Progressing   Problem: Pain Managment: Goal: General experience of comfort will improve Outcome: Progressing   Problem: Skin Integrity: Goal: Risk for impaired skin integrity will decrease Outcome: Progressing   

## 2020-12-21 NOTE — TOC Progression Note (Addendum)
Transition of Care [facility]) - Progression Note    Patient Details  Name: Tiffany Le MRN: [id number] Date of Birth: 16-Jan-1976  Transition of Care [facility]) CM/SW Contact  Zenon Mayo, RN Phone Number: 12/21/2020, 8:46 AM  Clinical Narrative:      Patient has wound vac to sternal wound, and she does not have any insurance, will probably go home with wound vac per [place] PA. She will need to get wound vac for charity til get a Medicaid Pending number.  Fax ed the charity application to [employer] with [employer]M. Will need HHRN for charity as well.  Left message with Lattie Haw with [facility] for referral for charity [facility] for wound vac. Received call back from [place] stating she is unable to take referral. Will try [facility] they are charity this week. NCM made referral to [facility] with [facility] for [place] for wound vac care , awaiting call back.  Myron PA states he will sign the escript todday for wound vac.  Per Ramond Marrow they are not able to take referral.   5/13-  14:07 NCM spoke with Dr. Marla Roe, informed her that patient will not have [place] for charity to see if patient can come to her clinic to do the wound vac change.  She states yes she would come to [facility] and the phone number is [phone].  This information was given to [facility] with [employer] wound vac.  Dr. Marla Roe states patient will be going back to OR next week.  Olivia Mackie will have wound vac possibly by Monday.      Expected Discharge Plan and Services                                                 Social Determinants of Health (SDOH) Interventions    Readmission Risk Interventions Readmission Risk Prevention Plan 10/11/2020  Transportation Screening Complete  HRI or [place] Complete  Social Work Consult for [place] Planning/Counseling Complete  Palliative Care Screening Not Applicable  Medication Review Press photographer) Complete  Some recent data might be hidden

## 2020-12-21 NOTE — Progress Notes (Signed)
1 Day Post-Op  Subjective: Resting in bed, reports had an episode of emesis today.  She reports she is feeling better today than yesterday.  No chest pain or shortness of breath. Objective: Vital signs in last 24 hours: Temp:  [98.9 F (37.2 C)-99.6 F (37.6 C)] 99.3 F (37.4 C) (05/13 1055) Pulse Rate:  [91-99] 94 (05/13 0724) Resp:  [12-20] 20 (05/13 0724) BP: (105-113)/(51-74) 109/51 (05/13 1055) SpO2:  [93 %-100 %] 100 % (05/13 0724) Last BM Date: 12/16/20  Intake/Output from previous day: 05/12 0701 - 05/13 0700 In: 740 [P.O.:240; I.V.:500] Out: 50 [Drains:50] Intake/Output this shift: Total I/O In: 240 [P.O.:240] Out: 0   General appearance: alert, cooperative and No distress Chest wall: no tenderness, Wound VAC in place, good seal noted.  Abir straps in place, some of them are coming loose.  Breast binder no longer in place as it had been soiled for emesis.  No erythema.  No cellulitic changes. Extremities: extremities normal, atraumatic, no cyanosis or edema  Lab Results:  CBC Latest Ref Rng & Units 12/16/2020 12/15/2020 12/14/2020  WBC 4.0 - 10.5 K/uL 6.6 6.7 -  Hemoglobin 12.0 - 15.0 g/dL 8.8(L) 9.8(L) 9.9(L)  Hematocrit 36.0 - 46.0 % 28.2(L) 32.1(L) 29.0(L)  Platelets 150 - 400 K/uL 330 380 -    BMET No results for input(s): NA, K, CL, CO2, GLUCOSE, BUN, CREATININE, CALCIUM in the last 72 hours. PT/INR No results for input(s): LABPROT, INR in the last 72 hours. ABG No results for input(s): PHART, HCO3 in the last 72 hours.  Invalid input(s): PCO2, PO2  Studies/Results: No results found.  Anti-infectives: Anti-infectives (From admission, onward)   Start     Dose/Rate Route Frequency Ordered Stop   12/16/20 1445  ceFEPIme (MAXIPIME) 2 g in sodium chloride 0.9 % 100 mL IVPB        2 g 200 mL/hr over 30 Minutes Intravenous Every 8 hours 12/16/20 1346     12/16/20 1300  cefTRIAXone (ROCEPHIN) 2 g in sodium chloride 0.9 % 100 mL IVPB  Status:  Discontinued         2 g 200 mL/hr over 30 Minutes Intravenous Every 24 hours 12/16/20 1202 12/16/20 1344   12/15/20 0400  vancomycin (VANCOREADY) IVPB 1000 mg/200 mL        1,000 mg 200 mL/hr over 60 Minutes Intravenous Every 12 hours 12/14/20 1849 12/15/20 0520   12/15/20 0000  ceFAZolin (ANCEF) IVPB 2g/100 mL premix        2 g 200 mL/hr over 30 Minutes Intravenous Every 8 hours 12/14/20 1849 12/15/20 0915   12/14/20 1635  vancomycin (VANCOCIN) 1,000 mg in sodium chloride 0.9 % 1,000 mL irrigation  Status:  Discontinued          As needed 12/14/20 1635 12/14/20 1702   12/14/20 1615  ceFAZolin (ANCEF) IVPB 2g/100 mL premix        2 g 200 mL/hr over 30 Minutes Intravenous  Once 12/14/20 1601 12/14/20 1621   12/14/20 1615  vancomycin (VANCOREADY) IVPB 1500 mg/300 mL        1,500 mg 150 mL/hr over 120 Minutes Intravenous  Once 12/14/20 1601 12/14/20 1621      Assessment/Plan: s/p Procedure(s): STERNAL WOUND DEBRIDEMENT WITH SKIN SUBSTITUTION AND ABRA  Wound VAC to remain in place.  ABRA to remain in place Nursing to order new breast binder to replace soiled binder.  Keep this on 24/7.  We will continue to follow.  Will discuss plans for further  surgical management with surgical team and reevaluate next week.    LOS: 7 days    Charlies Constable, PA-C 12/21/2020

## 2020-12-22 LAB — CBC
HCT: 28 % — ABNORMAL LOW (ref 36.0–46.0)
Hemoglobin: 9 g/dL — ABNORMAL LOW (ref 12.0–15.0)
MCH: 27.2 pg (ref 26.0–34.0)
MCHC: 32.1 g/dL (ref 30.0–36.0)
MCV: 84.6 fL (ref 80.0–100.0)
Platelets: 294 10*3/uL (ref 150–400)
RBC: 3.31 MIL/uL — ABNORMAL LOW (ref 3.87–5.11)
RDW: 14.2 % (ref 11.5–15.5)
WBC: 6.8 10*3/uL (ref 4.0–10.5)
nRBC: 0 % (ref 0.0–0.2)

## 2020-12-22 LAB — BASIC METABOLIC PANEL
Anion gap: 8 (ref 5–15)
BUN: 24 mg/dL — ABNORMAL HIGH (ref 6–20)
CO2: 28 mmol/L (ref 22–32)
Calcium: 8.5 mg/dL — ABNORMAL LOW (ref 8.9–10.3)
Chloride: 96 mmol/L — ABNORMAL LOW (ref 98–111)
Creatinine, Ser: 0.86 mg/dL (ref 0.44–1.00)
GFR, Estimated: >60 mL/min (ref >60)
Glucose, Bld: 75 mg/dL (ref 70–99)
Potassium: 3.4 mmol/L — ABNORMAL LOW (ref 3.5–5.1)
Sodium: 132 mmol/L — ABNORMAL LOW (ref 135–145)

## 2020-12-22 LAB — GLUCOSE, CAPILLARY
Glucose-Capillary: 87 mg/dL (ref 70–99)
Glucose-Capillary: 81 mg/dL (ref 70–99)
Glucose-Capillary: 73 mg/dL (ref 70–99)
Glucose-Capillary: 209 mg/dL — ABNORMAL HIGH (ref 70–99)

## 2020-12-22 MED ORDER — GABAPENTIN 100 MG PO CAPS
200.0000 mg | ORAL_CAPSULE | Freq: Three times a day (TID) | ORAL | Status: DC
  Administered 2020-12-22 (×2): 200 mg via ORAL
  Administered 2020-12-23 (×2): 100 mg via ORAL
  Administered 2020-12-23 – 2020-12-28 (×13): 200 mg via ORAL
  Filled 2020-12-22 (×18): qty 2

## 2020-12-22 MED ORDER — POTASSIUM CHLORIDE CRYS ER 10 MEQ PO TBCR
20.0000 meq | EXTENDED_RELEASE_TABLET | Freq: Once | ORAL | Status: AC
  Administered 2020-12-22: 20 meq via ORAL
  Filled 2020-12-22: qty 2

## 2020-12-22 MED ORDER — POTASSIUM CHLORIDE CRYS ER 20 MEQ PO TBCR
30.0000 meq | EXTENDED_RELEASE_TABLET | Freq: Two times a day (BID) | ORAL | Status: DC
  Administered 2020-12-22: 10 meq via ORAL
  Filled 2020-12-22: qty 1

## 2020-12-22 MED ORDER — POTASSIUM CHLORIDE CRYS ER 10 MEQ PO TBCR
30.0000 meq | EXTENDED_RELEASE_TABLET | Freq: Once | ORAL | Status: AC
  Administered 2020-12-22: 30 meq via ORAL
  Filled 2020-12-22: qty 3

## 2020-12-22 NOTE — Progress Notes (Signed)
      [address]       [place] [address]             [phone]      2 Days Post-Op Procedure(s) (LRB): STERNAL WOUND DEBRIDEMENT WITH SKIN SUBSTITUTION AND ABRA (N/A) Subjective: No complaints, wants to go home  Objective: Vital signs in last 24 hours: Temp:  [98.8 F (37.1 C)-99.3 F (37.4 C)] 98.9 F (37.2 C) (05/14 0722) Pulse Rate:  [86-92] 90 (05/14 0722) Cardiac Rhythm: Normal sinus rhythm (05/14 0900) Resp:  [17-20] 17 (05/14 0722) BP: (109-155)/(51-77) 130/57 (05/14 0722) SpO2:  [95 %-97 %] 96 % (05/14 0722)     Intake/Output from previous day: 05/13 0701 - 05/14 0700 In: 1679.9 [P.O.:480; IV Piggyback:1199.9] Out: 100 [Drains:100] Intake/Output this shift: Total I/O In: 10 [I.V.:10] Out: -   General appearance: alert, cooperative and no distress Heart: regular rate and rhythm, S1, S2 normal, no murmur, click, rub or gallop Lungs: clear to auscultation bilaterally Abdomen: soft, non-tender; bowel sounds normal; no masses,  no organomegaly Extremities: extremities normal, atraumatic, no cyanosis or edema Wound: wound vac and ABRA  Lab Results: Recent Labs    12/22/20 0357  WBC 6.8  HGB 9.0*  HCT 28.0*  PLT 294   BMET:  Recent Labs    12/22/20 0357  NA 132*  K 3.4*  CL 96*  CO2 28  GLUCOSE 75  BUN 24*  CREATININE 0.86  CALCIUM 8.5*    PT/INR: No results for input(s): LABPROT, INR in the last 72 hours. ABG    Component Value Date/Time   PHART 7.364 10/09/2020 2158   HCO3 23.9 10/09/2020 2158   TCO2 24 12/14/2020 1629   ACIDBASEDEF 1.0 10/09/2020 2158   O2SAT 59.6 10/27/2020 0530   CBG (last 3)  Recent Labs    12/21/20 1605 12/21/20 2111 12/22/20 0620  GLUCAP 76 124* 87    Assessment/Plan: S/P Procedure(s) (LRB): STERNAL WOUND DEBRIDEMENT WITH SKIN SUBSTITUTION AND ABRA (N/A)  1 tmax 99.6, VSS SBP 150s-110s, stable 2 sats good on RA 3 BS control is improved 76/124/87 4 hypokalemia- will replace.  Creatinine 0.86 5. H and H stable, 9.0/28.0 6 ABRA device and wound management as per plastics     LOS: 8 days    Elgie Collard 12/22/2020

## 2020-12-22 NOTE — Plan of Care (Signed)
  Problem: Education: Goal: Knowledge of General Education information will improve Description: Including pain rating scale, medication(s)/side effects and non-pharmacologic comfort measures Outcome: Progressing   Problem: Health Behavior/Discharge Planning: Goal: Ability to manage health-related needs will improve Outcome: Progressing   Problem: Clinical Measurements: Goal: Ability to maintain clinical measurements within normal limits will improve Outcome: Progressing   Problem: Clinical Measurements: Goal: Will remain free from infection Outcome: Progressing   Problem: Clinical Measurements: Goal: Diagnostic test results will improve Outcome: Progressing   Problem: Activity: Goal: Risk for activity intolerance will decrease Outcome: Progressing   Problem: Nutrition: Goal: Adequate nutrition will be maintained Outcome: Progressing   Problem: Pain Managment: Goal: General experience of comfort will improve Outcome: Progressing   Problem: Skin Integrity: Goal: Risk for impaired skin integrity will decrease Outcome: Progressing

## 2020-12-22 NOTE — Progress Notes (Signed)
2 Days Post-Op Procedure(s) (LRB): STERNAL WOUND DEBRIDEMENT WITH SKIN SUBSTITUTION AND ABRA (N/A) Subjective: Had episode of nausea/vomiting; feeling better  Objective: Vital signs in last 24 hours: Temp:  [98.8 F (37.1 C)-99 F (37.2 C)] 98.9 F (37.2 C) (05/14 0722) Pulse Rate:  [86-92] 90 (05/14 0722) Cardiac Rhythm: Normal sinus rhythm (05/14 0900) Resp:  [17-20] 17 (05/14 0722) BP: (111-155)/(57-77) 130/57 (05/14 0722) SpO2:  [95 %-97 %] 96 % (05/14 0722)  Hemodynamic parameters for last 24 hours:    Intake/Output from previous day: 05/13 0701 - 05/14 0700 In: 1679.9 [P.O.:480; IV Piggyback:1199.9] Out: 100 [Drains:100] Intake/Output this shift: Total I/O In: 10 [I.V.:10] Out: -   General appearance: alert and cooperative Neurologic: intact Wound: dressing intact  Lab Results: Recent Labs    12/22/20 0357  WBC 6.8  HGB 9.0*  HCT 28.0*  PLT 294   BMET:  Recent Labs    12/22/20 0357  NA 132*  K 3.4*  CL 96*  CO2 28  GLUCOSE 75  BUN 24*  CREATININE 0.86  CALCIUM 8.5*    PT/INR: No results for input(s): LABPROT, INR in the last 72 hours. ABG    Component Value Date/Time   PHART 7.364 10/09/2020 2158   HCO3 23.9 10/09/2020 2158   TCO2 24 12/14/2020 1629   ACIDBASEDEF 1.0 10/09/2020 2158   O2SAT 59.6 10/27/2020 0530   CBG (last 3)  Recent Labs    12/21/20 1605 12/21/20 2111 12/22/20 0620  GLUCAP 76 124* 87    Assessment/Plan: S/P Procedure(s) (LRB): STERNAL WOUND DEBRIDEMENT WITH SKIN SUBSTITUTION AND ABRA (N/A) Mobilize change dressing in OR 1st of week.   LOS: 8 days    Tiffany Le 12/22/2020

## 2020-12-23 LAB — GLUCOSE, CAPILLARY
Glucose-Capillary: 115 mg/dL — ABNORMAL HIGH (ref 70–99)
Glucose-Capillary: 151 mg/dL — ABNORMAL HIGH (ref 70–99)
Glucose-Capillary: 150 mg/dL — ABNORMAL HIGH (ref 70–99)
Glucose-Capillary: 117 mg/dL — ABNORMAL HIGH (ref 70–99)

## 2020-12-23 LAB — BASIC METABOLIC PANEL
Anion gap: 8 (ref 5–15)
BUN: 26 mg/dL — ABNORMAL HIGH (ref 6–20)
CO2: 27 mmol/L (ref 22–32)
Calcium: 8.8 mg/dL — ABNORMAL LOW (ref 8.9–10.3)
Chloride: 98 mmol/L (ref 98–111)
Creatinine, Ser: 1.03 mg/dL — ABNORMAL HIGH (ref 0.44–1.00)
GFR, Estimated: >60 mL/min (ref >60)
Glucose, Bld: 144 mg/dL — ABNORMAL HIGH (ref 70–99)
Potassium: 4.3 mmol/L (ref 3.5–5.1)
Sodium: 133 mmol/L — ABNORMAL LOW (ref 135–145)

## 2020-12-23 NOTE — Progress Notes (Signed)
      [address]       [place] [address]             [phone]      3 Days Post-Op Procedure(s) (LRB): STERNAL WOUND DEBRIDEMENT WITH SKIN SUBSTITUTION AND ABRA (N/A) Subjective: Fresh breast binder in place, good suction on the vac.  Objective: Vital signs in last 24 hours: Temp:  [98.2 F (36.8 C)-99.3 F (37.4 C)] 98.4 F (36.9 C) (05/15 0740) Pulse Rate:  [84-94] 84 (05/15 0740) Cardiac Rhythm: Normal sinus rhythm (05/15 0800) Resp:  [10-19] 18 (05/15 0740) BP: (100-140)/(53-73) 135/73 (05/15 0740) SpO2:  [93 %-98 %] 97 % (05/15 0740)     Intake/Output from previous day: 05/14 0701 - 05/15 0700 In: 950 [P.O.:640; I.V.:10; IV Piggyback:300] Out: 50 [Drains:50] Intake/Output this shift: Total I/O In: 110 [P.O.:100; I.V.:10] Out: -   General appearance: alert, cooperative and no distress Heart: regular rate and rhythm, S1, S2 normal, no murmur, click, rub or gallop Lungs: clear to auscultation bilaterally Abdomen: soft, non-tender; bowel sounds normal; no masses,  no organomegaly Extremities: extremities normal, atraumatic, no cyanosis or edema Wound: with ABRA device  Lab Results: Recent Labs    12/22/20 0357  WBC 6.8  HGB 9.0*  HCT 28.0*  PLT 294   BMET:  Recent Labs    12/22/20 0357  NA 132*  K 3.4*  CL 96*  CO2 28  GLUCOSE 75  BUN 24*  CREATININE 0.86  CALCIUM 8.5*    PT/INR: No results for input(s): LABPROT, INR in the last 72 hours. ABG    Component Value Date/Time   PHART 7.364 10/09/2020 2158   HCO3 23.9 10/09/2020 2158   TCO2 24 12/14/2020 1629   ACIDBASEDEF 1.0 10/09/2020 2158   O2SAT 59.6 10/27/2020 0530   CBG (last 3)  Recent Labs    12/22/20 1604 12/22/20 2106 12/23/20 0624  GLUCAP 73 209* 115*    Assessment/Plan: S/P Procedure(s) (LRB): STERNAL WOUND DEBRIDEMENT WITH SKIN SUBSTITUTION AND ABRA (N/A)  1 tmax 99.6, VSS SBP 150s-110s, stable 2 sats good on RA 3 BS control is improved  73/209/115 4 hypokalemia- will replace. Creatinine 0.86 5. H and H stable, 9.0/28.0 6 ABRA device and wound management as per plastics   LOS: 9 days    Elgie Collard 12/23/2020

## 2020-12-24 ENCOUNTER — Encounter (HOSPITAL_COMMUNITY): Payer: Self-pay

## 2020-12-24 LAB — GLUCOSE, CAPILLARY
Glucose-Capillary: 103 mg/dL — ABNORMAL HIGH (ref 70–99)
Glucose-Capillary: 150 mg/dL — ABNORMAL HIGH (ref 70–99)
Glucose-Capillary: 53 mg/dL — ABNORMAL LOW (ref 70–99)
Glucose-Capillary: 88 mg/dL (ref 70–99)
Glucose-Capillary: 194 mg/dL — ABNORMAL HIGH (ref 70–99)

## 2020-12-24 LAB — BASIC METABOLIC PANEL
Anion gap: 8 (ref 5–15)
BUN: 29 mg/dL — ABNORMAL HIGH (ref 6–20)
CO2: 27 mmol/L (ref 22–32)
Calcium: 8.6 mg/dL — ABNORMAL LOW (ref 8.9–10.3)
Chloride: 100 mmol/L (ref 98–111)
Creatinine, Ser: 1.15 mg/dL — ABNORMAL HIGH (ref 0.44–1.00)
GFR, Estimated: >60 mL/min (ref >60)
Glucose, Bld: 157 mg/dL — ABNORMAL HIGH (ref 70–99)
Potassium: 4 mmol/L (ref 3.5–5.1)
Sodium: 135 mmol/L (ref 135–145)

## 2020-12-24 MED ORDER — DIPHENHYDRAMINE HCL 25 MG PO CAPS
25.0000 mg | ORAL_CAPSULE | Freq: Four times a day (QID) | ORAL | Status: DC | PRN
  Administered 2020-12-24: 25 mg via ORAL
  Filled 2020-12-24: qty 1

## 2020-12-24 MED ORDER — DIPHENHYDRAMINE HCL 25 MG PO CAPS
50.0000 mg | ORAL_CAPSULE | Freq: Four times a day (QID) | ORAL | Status: DC | PRN
  Administered 2020-12-25 – 2020-12-27 (×2): 50 mg via ORAL
  Filled 2020-12-24 (×3): qty 2

## 2020-12-24 NOTE — Progress Notes (Signed)
4 Days Post-Op  Subjective: 45 year old female with sternal wound.  Underwent multiple debridements with most recent debridement and placement of wound matrix with Dr. Marla Roe on 12/20/2020.  She reports she is doing well today.  She complains of no pain.  Denies any fevers or chills or nausea or vomiting or chest pain or shortness of breath.  She does report that she feels itchy, thinks it may be associated with the breast binder.  Objective: Vital signs in last 24 hours: Temp:  [97.8 F (36.6 C)-98.7 F (37.1 C)] 98.6 F (37 C) (05/16 1119) Pulse Rate:  [81-91] 84 (05/16 1119) Resp:  [17-20] 19 (05/16 1119) BP: (107-141)/(57-77) 107/58 (05/16 1119) SpO2:  [92 %-98 %] 97 % (05/16 1119) Last BM Date: 12/22/20  Intake/Output from previous day: 05/15 0701 - 05/16 0700 In: 770 [P.O.:560; I.V.:10; IV Piggyback:200] Out: 0  Intake/Output this shift: No intake/output data recorded.  General appearance: alert, cooperative, no distress and Sitting up in bed Chest wall: Wound VAC in place with good seal noted.  Serosanguineous drainage in canister Pulses: 2+ and symmetric  Lab Results:  CBC Latest Ref Rng & Units 12/22/2020 12/16/2020 12/15/2020  WBC 4.0 - 10.5 K/uL 6.8 6.6 6.7  Hemoglobin 12.0 - 15.0 g/dL 9.0(L) 8.8(L) 9.8(L)  Hematocrit 36.0 - 46.0 % 28.0(L) 28.2(L) 32.1(L)  Platelets 150 - 400 K/uL 294 330 380    BMET Recent Labs    12/23/20 1204 12/24/20 0842  NA 133* 135  K 4.3 4.0  CL 98 100  CO2 27 27  GLUCOSE 144* 157*  BUN 26* 29*  CREATININE 1.03* 1.15*  CALCIUM 8.8* 8.6*   PT/INR No results for input(s): LABPROT, INR in the last 72 hours. ABG No results for input(s): PHART, HCO3 in the last 72 hours.  Invalid input(s): PCO2, PO2  Studies/Results: No results found.  Anti-infectives: Anti-infectives (From admission, onward)   Start     Dose/Rate Route Frequency Ordered Stop   12/16/20 1445  ceFEPIme (MAXIPIME) 2 g in sodium chloride 0.9 % 100 mL IVPB         2 g 200 mL/hr over 30 Minutes Intravenous Every 8 hours 12/16/20 1346     12/16/20 1300  cefTRIAXone (ROCEPHIN) 2 g in sodium chloride 0.9 % 100 mL IVPB  Status:  Discontinued        2 g 200 mL/hr over 30 Minutes Intravenous Every 24 hours 12/16/20 1202 12/16/20 1344   12/15/20 0400  vancomycin (VANCOREADY) IVPB 1000 mg/200 mL        1,000 mg 200 mL/hr over 60 Minutes Intravenous Every 12 hours 12/14/20 1849 12/15/20 0520   12/15/20 0000  ceFAZolin (ANCEF) IVPB 2g/100 mL premix        2 g 200 mL/hr over 30 Minutes Intravenous Every 8 hours 12/14/20 1849 12/15/20 0915   12/14/20 1635  vancomycin (VANCOCIN) 1,000 mg in sodium chloride 0.9 % 1,000 mL irrigation  Status:  Discontinued          As needed 12/14/20 1635 12/14/20 1702   12/14/20 1615  ceFAZolin (ANCEF) IVPB 2g/100 mL premix        2 g 200 mL/hr over 30 Minutes Intravenous  Once 12/14/20 1601 12/14/20 1621   12/14/20 1615  vancomycin (VANCOREADY) IVPB 1500 mg/300 mL        1,500 mg 150 mL/hr over 120 Minutes Intravenous  Once 12/14/20 1601 12/14/20 1621      Assessment/Plan: s/p Procedure(s): STERNAL WOUND DEBRIDEMENT WITH SKIN SUBSTITUTION AND ABRA  Patient scheduled for return to OR tomorrow with CT surgery for additional debridement, wound VAC change and possible delayed sternal closure. We remain available as needed for any assistance with reconstruction.  Patient is stable   LOS: 10 days    Charlies Constable, PA-C 12/24/2020

## 2020-12-24 NOTE — Plan of Care (Signed)

## 2020-12-24 NOTE — Progress Notes (Signed)
      [address]       [place] [address]             [phone]      4 Days Post-Op Procedure(s) (LRB): STERNAL WOUND DEBRIDEMENT WITH SKIN SUBSTITUTION AND ABRA (N/A) Subjective: C/o pruritis- body   Objective: Vital signs in last 24 hours: Temp:  [97.8 F (36.6 C)-98.7 F (37.1 C)] 97.9 F (36.6 C) (05/16 0744) Pulse Rate:  [81-91] 91 (05/16 0744) Cardiac Rhythm: Normal sinus rhythm (05/16 0738) Resp:  [12-20] 18 (05/16 0744) BP: (103-141)/(51-77) 141/77 (05/16 0744) SpO2:  [92 %-98 %] 97 % (05/16 0744)  Hemodynamic parameters for last 24 hours:    Intake/Output from previous day: 05/15 0701 - 05/16 0700 In: 770 [P.O.:560; I.V.:10; IV Piggyback:200] Out: 0  Intake/Output this shift: No intake/output data recorded.  General appearance: alert, cooperative and no distress Wound: VAC in place  Lab Results: Recent Labs    12/22/20 0357  WBC 6.8  HGB 9.0*  HCT 28.0*  PLT 294   BMET:  Recent Labs    12/22/20 0357 12/23/20 1204  NA 132* 133*  K 3.4* 4.3  CL 96* 98  CO2 28 27  GLUCOSE 75 144*  BUN 24* 26*  CREATININE 0.86 1.03*  CALCIUM 8.5* 8.8*    PT/INR: No results for input(s): LABPROT, INR in the last 72 hours. ABG    Component Value Date/Time   PHART 7.364 10/09/2020 2158   HCO3 23.9 10/09/2020 2158   TCO2 24 12/14/2020 1629   ACIDBASEDEF 1.0 10/09/2020 2158   O2SAT 59.6 10/27/2020 0530   CBG (last 3)  Recent Labs    12/23/20 1127 12/23/20 1610 12/23/20 2114  GLUCAP 151* 150* 117*    Meds Scheduled Meds: . aspirin EC  81 mg Oral Daily  . atorvastatin  80 mg Oral Daily  . bisacodyl  10 mg Oral Daily  . carvedilol  6.25 mg Oral BID WC  . dapagliflozin propanediol  10 mg Oral Daily  . enoxaparin (LOVENOX) injection  40 mg Subcutaneous Q24H  . gabapentin  200 mg Oral TID  . glipiZIDE  10 mg Oral QAC breakfast  . insulin aspart  0-24 Units Subcutaneous TID AC & HS  . insulin glargine  8 Units Subcutaneous BID   . isosorbide-hydrALAZINE  1 tablet Oral TID  . multivitamin with minerals  1 tablet Oral Daily  . sacubitril-valsartan  1 tablet Oral BID  . senna-docusate  1 tablet Oral QHS  . sodium chloride flush  10-40 mL Intracatheter Q12H  . spironolactone  25 mg Oral Daily  . torsemide  40 mg Oral Daily   Continuous Infusions: . sodium chloride 250 mL (12/15/20 0109)  . ceFEPime (MAXIPIME) IV 2 g (12/24/20 0652)   PRN Meds:.sodium chloride, acetaminophen, albuterol, diphenhydrAMINE, ondansetron (ZOFRAN) IV, oxyCODONE, sodium chloride flush, traMADol  Xrays No results found.  Assessment/Plan: S/P Procedure(s) (LRB): STERNAL WOUND DEBRIDEMENT WITH SKIN SUBSTITUTION AND ABRA (N/A)   1 afeb, VSS 2 sats good on RA 3 sugars adeq controlled 4 no new labs 5 VAC supplies obtained for home 6 plastics to re-eval wound this week     LOS: 10 days    Tiffany Le 12/24/2020

## 2020-12-25 ENCOUNTER — Inpatient Hospital Stay (HOSPITAL_COMMUNITY): Payer: Medicaid Other

## 2020-12-25 ENCOUNTER — Encounter (HOSPITAL_COMMUNITY)
Admission: AD | Disposition: A | Discharge status: Home / Self Care | Payer: Self-pay | Source: Home / Self Care | Attending: Cardiothoracic Surgery

## 2020-12-25 DIAGNOSIS — I9789 Other postprocedural complications and disorders of the circulatory system, not elsewhere classified: Secondary | ICD-10-CM

## 2020-12-25 HISTORY — PX: STERNAL WOUND DEBRIDEMENT: SHX1058

## 2020-12-25 HISTORY — PX: STERNAL CLOSURE: SHX6203

## 2020-12-25 HISTORY — PX: APPLICATION OF WOUND VAC: SHX5189

## 2020-12-25 LAB — GLUCOSE, CAPILLARY
Glucose-Capillary: 114 mg/dL — ABNORMAL HIGH (ref 70–99)
Glucose-Capillary: 111 mg/dL — ABNORMAL HIGH (ref 70–99)
Glucose-Capillary: 87 mg/dL (ref 70–99)
Glucose-Capillary: 99 mg/dL (ref 70–99)
Glucose-Capillary: 82 mg/dL (ref 70–99)
Glucose-Capillary: 105 mg/dL — ABNORMAL HIGH (ref 70–99)

## 2020-12-25 SURGERY — DEBRIDEMENT, WOUND, STERNUM
Anesthesia: Monitor Anesthesia Care | Site: Chest

## 2020-12-25 MED ORDER — CHLORHEXIDINE GLUCONATE 0.12 % MT SOLN: 15.0000 mL | Freq: Once | OROMUCOSAL | Status: AC

## 2020-12-25 MED ORDER — PROPOFOL 10 MG/ML IV BOLUS
INTRAVENOUS | Status: DC | PRN
  Administered 2020-12-25: 30 mg via INTRAVENOUS

## 2020-12-25 MED ORDER — MIDAZOLAM HCL 5 MG/5ML IJ SOLN
INTRAMUSCULAR | Status: DC | PRN
  Administered 2020-12-25: 1 mg via INTRAVENOUS

## 2020-12-25 MED ORDER — FENTANYL CITRATE (PF) 250 MCG/5ML IJ SOLN
INTRAMUSCULAR | Status: AC
  Filled 2020-12-25: qty 5

## 2020-12-25 MED ORDER — FENTANYL CITRATE (PF) 100 MCG/2ML IJ SOLN
INTRAMUSCULAR | Status: AC
  Filled 2020-12-25: qty 2

## 2020-12-25 MED ORDER — FENTANYL CITRATE (PF) 250 MCG/5ML IJ SOLN
INTRAMUSCULAR | Status: DC | PRN
  Administered 2020-12-25: 25 ug via INTRAVENOUS

## 2020-12-25 MED ORDER — ORAL CARE MOUTH RINSE: 15.0000 mL | Freq: Once | OROMUCOSAL | Status: AC

## 2020-12-25 MED ORDER — LIDOCAINE 2% (20 MG/ML) 5 ML SYRINGE
INTRAMUSCULAR | Status: AC
  Filled 2020-12-25: qty 5

## 2020-12-25 MED ORDER — PROPOFOL 10 MG/ML IV BOLUS
INTRAVENOUS | Status: AC
  Filled 2020-12-25: qty 20

## 2020-12-25 MED ORDER — PHENYLEPHRINE 40 MCG/ML (10ML) SYRINGE FOR IV PUSH (FOR BLOOD PRESSURE SUPPORT)
PREFILLED_SYRINGE | INTRAVENOUS | Status: DC | PRN
  Administered 2020-12-25: 80 ug via INTRAVENOUS

## 2020-12-25 MED ORDER — AMISULPRIDE (ANTIEMETIC) 5 MG/2ML IV SOLN: 10.0000 mg | Freq: Once | INTRAVENOUS | Status: DC | PRN

## 2020-12-25 MED ORDER — PROPOFOL 500 MG/50ML IV EMUL
INTRAVENOUS | Status: DC | PRN
  Administered 2020-12-25: 75 ug/kg/min via INTRAVENOUS

## 2020-12-25 MED ORDER — LIDOCAINE 2% (20 MG/ML) 5 ML SYRINGE
INTRAMUSCULAR | Status: DC | PRN
  Administered 2020-12-25: 60 mg via INTRAVENOUS

## 2020-12-25 MED ORDER — ONDANSETRON HCL 4 MG/2ML IJ SOLN
INTRAMUSCULAR | Status: AC
  Filled 2020-12-25: qty 2

## 2020-12-25 MED ORDER — LACTATED RINGERS IV SOLN: INTRAVENOUS | Status: DC

## 2020-12-25 MED ORDER — FENTANYL CITRATE (PF) 100 MCG/2ML IJ SOLN
25.0000 ug | INTRAMUSCULAR | Status: DC | PRN
  Administered 2020-12-25: 50 ug via INTRAVENOUS

## 2020-12-25 MED ORDER — MIDAZOLAM HCL 2 MG/2ML IJ SOLN
INTRAMUSCULAR | Status: AC
  Filled 2020-12-25: qty 2

## 2020-12-25 MED ORDER — ONDANSETRON HCL 4 MG/2ML IJ SOLN
INTRAMUSCULAR | Status: DC | PRN
  Administered 2020-12-25: 4 mg via INTRAVENOUS

## 2020-12-25 MED ORDER — CHLORHEXIDINE GLUCONATE 0.12 % MT SOLN
OROMUCOSAL | Status: AC
  Administered 2020-12-25: 15 mL via OROMUCOSAL
  Filled 2020-12-25: qty 15

## 2020-12-25 MED ORDER — SODIUM CHLORIDE 0.9 % IR SOLN
Status: DC | PRN
  Administered 2020-12-25: 1000 mL

## 2020-12-25 SURGICAL SUPPLY — 70 items
APL SKNCLS STERI-STRIP NONHPOA (GAUZE/BANDAGES/DRESSINGS) ×1
BAG DECANTER FOR FLEXI CONT (MISCELLANEOUS) ×2 IMPLANT
BANDAGE ESMARK 6X9 LF (GAUZE/BANDAGES/DRESSINGS) IMPLANT
BENZOIN TINCTURE PRP APPL 2/3 (GAUZE/BANDAGES/DRESSINGS) ×1 IMPLANT
BINDER BREAST 3XL (GAUZE/BANDAGES/DRESSINGS) ×1 IMPLANT
BLADE CLIPPER SURG (BLADE) ×2 IMPLANT
BLADE SURG 10 STRL SS (BLADE) ×2 IMPLANT
BNDG CMPR 9X6 STRL LF SNTH (GAUZE/BANDAGES/DRESSINGS)
BNDG ESMARK 6X9 LF (GAUZE/BANDAGES/DRESSINGS)
BNDG GAUZE ELAST 4 BULKY (GAUZE/BANDAGES/DRESSINGS) IMPLANT
CANISTER SUCT 3000ML PPV (MISCELLANEOUS) ×2 IMPLANT
CANISTER WOUND CARE 500ML ATS (WOUND CARE) ×2 IMPLANT
CANISTER WOUNDNEG PRESSURE 500 (CANNISTER) ×1 IMPLANT
CATH FOLEY 2WAY SLVR  5CC 16FR (CATHETERS)
CATH FOLEY 2WAY SLVR 5CC 16FR (CATHETERS) IMPLANT
CLIP VESOCCLUDE SM WIDE 24/CT (CLIP) IMPLANT
CNTNR URN SCR LID CUP LEK RST (MISCELLANEOUS) IMPLANT
CONT SPEC 4OZ STRL OR WHT (MISCELLANEOUS)
COVER SURGICAL LIGHT HANDLE (MISCELLANEOUS) ×5 IMPLANT
DRAPE INCISE IOBAN 66X45 STRL (DRAPES) IMPLANT
DRAPE LAPAROSCOPIC ABDOMINAL (DRAPES) ×2 IMPLANT
DRAPE WARM FLUID 44X44 (DRAPES) IMPLANT
DRESSING PEEL AND PLAC PRVNA20 (GAUZE/BANDAGES/DRESSINGS) IMPLANT
DRSG AQUACEL AG ADV 3.5X14 (GAUZE/BANDAGES/DRESSINGS) ×2 IMPLANT
DRSG PEEL AND PLACE PREVENA 20 (GAUZE/BANDAGES/DRESSINGS) ×2
DRSG VAC ATS LRG SENSATRAC (GAUZE/BANDAGES/DRESSINGS) IMPLANT
DRSG VAC ATS MED SENSATRAC (GAUZE/BANDAGES/DRESSINGS) IMPLANT
DRSG VAC ATS SM SENSATRAC (GAUZE/BANDAGES/DRESSINGS) IMPLANT
ELECT REM PT RETURN 9FT ADLT (ELECTROSURGICAL) ×2
ELECTRODE REM PT RTRN 9FT ADLT (ELECTROSURGICAL) ×1 IMPLANT
EVACUATOR SILICONE 100CC (DRAIN) ×1 IMPLANT
GAUZE SPONGE 4X4 12PLY STRL (GAUZE/BANDAGES/DRESSINGS) ×2 IMPLANT
GAUZE XEROFORM 5X9 LF (GAUZE/BANDAGES/DRESSINGS) IMPLANT
GLOVE NEODERM STRL 7.5  LF PF (GLOVE) ×1
GLOVE NEODERM STRL 7.5 LF PF (GLOVE) ×1 IMPLANT
GLOVE SURG MICRO LTX SZ6 (GLOVE) ×1 IMPLANT
GLOVE SURG NEODERM 7.5  LF PF (GLOVE) ×1
GOWN STRL REUS W/ TWL LRG LVL3 (GOWN DISPOSABLE) ×3 IMPLANT
GOWN STRL REUS W/TWL LRG LVL3 (GOWN DISPOSABLE) ×6
HANDPIECE INTERPULSE COAX TIP (DISPOSABLE)
HEMOSTAT POWDER SURGIFOAM 1G (HEMOSTASIS) IMPLANT
HEMOSTAT SURGICEL 2X14 (HEMOSTASIS) IMPLANT
KIT BASIN OR (CUSTOM PROCEDURE TRAY) ×2 IMPLANT
KIT TURNOVER KIT B (KITS) ×2 IMPLANT
MICROMATRIX 1000MG (Tissue) ×2 IMPLANT
NS IRRIG 1000ML POUR BTL (IV SOLUTION) ×2 IMPLANT
PACK GENERAL/GYN (CUSTOM PROCEDURE TRAY) ×2 IMPLANT
PAD ABD 8X10 STRL (GAUZE/BANDAGES/DRESSINGS) ×3 IMPLANT
PAD ARMBOARD 7.5X6 YLW CONV (MISCELLANEOUS) ×4 IMPLANT
SET HNDPC FAN SPRY TIP SCT (DISPOSABLE) IMPLANT
SOL PREP POV-IOD 4OZ 10% (MISCELLANEOUS) IMPLANT
SOLUTION PARTIC MCRMTRX 1000MG (Tissue) IMPLANT
SPONGE LAP 18X18 RF (DISPOSABLE) ×2 IMPLANT
STAPLER VISISTAT 35W (STAPLE) IMPLANT
SUT ETHILON 1 LR 30 (SUTURE) ×6 IMPLANT
SUT ETHILON 2 LR (SUTURE) ×2 IMPLANT
SUT ETHILON 3 0 FSL (SUTURE) ×1 IMPLANT
SUT MNCRL AB 3-0 PS2 18 (SUTURE) ×1 IMPLANT
SUT PDS AB 1 CTX 36 (SUTURE) IMPLANT
SUT PROLENE 2 0 MH 48 (SUTURE) IMPLANT
SUT VIC AB 2-0 CT1 27 (SUTURE) ×2
SUT VIC AB 2-0 CT1 TAPERPNT 27 (SUTURE) IMPLANT
SUT VIC AB 2-0 CTX 27 (SUTURE) ×5 IMPLANT
SUT VIC AB 2-0 CTX 36 (SUTURE) ×1 IMPLANT
SWAB COLLECTION DEVICE MRSA (MISCELLANEOUS) IMPLANT
SWAB CULTURE ESWAB REG 1ML (MISCELLANEOUS) IMPLANT
SYR 5ML LL (SYRINGE) IMPLANT
TOWEL GREEN STERILE (TOWEL DISPOSABLE) ×3 IMPLANT
TOWEL GREEN STERILE FF (TOWEL DISPOSABLE) ×2 IMPLANT
WATER STERILE IRR 1000ML POUR (IV SOLUTION) ×2 IMPLANT

## 2020-12-25 NOTE — Anesthesia Preprocedure Evaluation (Addendum)
Anesthesia Evaluation  Patient identified by MRN, date of birth, ID band Patient awake    Reviewed: Allergy & Precautions, NPO status , Patient's Chart, lab work & pertinent test results, reviewed documented beta blocker date and time   Airway Mallampati: III  TM Distance: >3 FB Neck ROM: Full    Dental  (+) Teeth Intact, Dental Advisory Given   Pulmonary neg pulmonary ROS, shortness of breath and with exertion,    breath sounds clear to auscultation       Cardiovascular hypertension, Pt. 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 wound   Neuro/Psych CVA, Residual Symptoms negative psych ROS   GI/Hepatic negative GI ROS, Neg liver ROS,   Endo/Other  diabetes, Poorly Controlled, Type 2Morbid obesityHLD  Renal/GU negative Renal ROS  negative genitourinary   Musculoskeletal negative musculoskeletal ROS (+) Sternal wound   Abdominal Normal abdominal exam  (+) + obese,   Peds  Hematology  (+) anemia , Lovenox therapy   Anesthesia Other Findings   Reproductive/Obstetrics                            Anesthesia Physical  Anesthesia Plan  ASA: III  Anesthesia Plan: MAC   Post-op Pain Management:    Induction: Intravenous  PONV Risk Score and Plan: 3 and Ondansetron and Propofol infusion  Airway Management Planned: Natural Airway and Simple Face Mask  Additional Equipment: None  Intra-op Plan:   Post-operative Plan:   Informed Consent: I have reviewed the patients History and Physical, chart, labs and discussed the procedure including the risks, benefits and alternatives for the proposed anesthesia with the patient or authorized representative who has indicated his/her understanding and acceptance.     Dental advisory given  Plan Discussed with: CRNA and Anesthesiologist  Anesthesia Plan Comments:  (Echo:  2. The left ventricular ejection fraction, by estimation, is 40 to 45%.  The left ventricle has mildly decreased function.  3. Based on limited views, there is severe hypokinesis of all septal  segments, the mid-to-apical anterior LV segments, and apex.  4. There is mild asymmetric left ventricular hypertrophy of the posterior  LV segment.  5. Left ventricular diastolic parameters are indeterminate due to  arrhythmia. Elevated left atrial pressure.  6. Right ventricular systolic function is mildly reduced. The right  ventricular size is normal.  7. The mitral valve is normal in structure. Mild mitral valve  regurgitation.  8. The aortic valve is tricuspid. Aortic valve regurgitation is not  visualized. No aortic stenosis is present. )        Anesthesia Quick Evaluation

## 2020-12-25 NOTE — Transfer of Care (Signed)
Immediate Anesthesia Transfer of Care Note  Patient: Tiffany Le  Procedure(s) Performed: STERNAL WOUND IRRIGATION AND DEBRIDEMENT (N/A Chest) WOUND VAC CHANGE (N/A Chest) STERNAL CLOSURE (N/A Chest)  Patient Location: PACU  Anesthesia Type:MAC  Level of Consciousness: awake, alert  and oriented  Airway & Oxygen Therapy: Patient Spontanous Breathing and Patient connected to face mask oxygen  Post-op Assessment: Report given to RN and Post -op Vital signs reviewed and stable  Post vital signs: Reviewed and stable  Last Vitals:  Vitals Value Taken Time  BP 117/73 12/25/20 1454  Temp    Pulse 81 12/25/20 1500  Resp 24 12/25/20 1500  SpO2 94 % 12/25/20 1500  Vitals shown include unvalidated device data.  Last Pain:  Vitals:   12/25/20 1228  TempSrc:   PainSc: 0-No pain      Patients Stated Pain Goal: 2 (91/47/82 9562)  Complications: No complications documented.

## 2020-12-25 NOTE — H&P (Signed)
History and Physical Interval Note:  12/25/2020 1:19 PM  Tiffany Le  has presented today for surgery, with the diagnosis of Sternal wound infection.  The various methods of treatment have been discussed with the patient and family. After consideration of risks, benefits and other options for treatment, the patient has consented to  Procedure(s): STERNAL WOUND IRRIGATION AND DEBRIDEMENT (N/A) WOUND VAC CHANGE (N/A) possible delayed STERNAL CLOSURE (N/A) as a surgical intervention.  The patient's history has been reviewed, patient examined, no change in status, stable for surgery.  I have reviewed the patient's chart and labs.  Questions were answered to the patient's satisfaction.     [name]

## 2020-12-25 NOTE — Brief Op Note (Signed)
12/25/2020  3:29 PM  PATIENT:  Tiffany Le  45 y.o. female  PRE-OPERATIVE DIAGNOSIS:  Sternal wound infection  POST-OPERATIVE DIAGNOSIS:  Sternal wound infection  PROCEDURE:  Procedure(s): STERNAL WOUND IRRIGATION AND DEBRIDEMENT (N/A) WOUND VAC CHANGE (N/A) STERNAL CLOSURE (N/A)  Secondary closure of superficial sternal defect Application of Prevena management system  SURGEON:  Surgeon(s) and Role:    * [name], MD - Primary  PHYSICIAN ASSISTANT: n/a  ASSISTANTS: staff   ANESTHESIA:   MAC EBL:  15 mL   BLOOD ADMINISTERED:none  DRAINS: (10Fr) Jackson-Pratt drain(s) with closed bulb suction in the presternal tissue   LOCAL MEDICATIONS USED:  NONE  SPECIMEN:  No Specimen  DISPOSITION OF SPECIMEN:  N/A  COUNTS:  YES  TOURNIQUET:  * No tourniquets in log *  DICTATION: .Note written in EPIC  PLAN OF CARE: Admit to inpatient   PATIENT DISPOSITION:  PACU - hemodynamically stable.   Delay start of Pharmacological VTE agent (>24hrs) due to surgical blood loss or risk of bleeding: yes

## 2020-12-25 NOTE — Anesthesia Procedure Notes (Signed)
Procedure Name: MAC Date/Time: 12/25/2020 1:45 PM Performed by: Trinna Post., CRNA Pre-anesthesia Checklist: Emergency Drugs available, Patient identified, Suction available, Patient being monitored and Timeout performed Patient Re-evaluated:Patient Re-evaluated prior to induction Oxygen Delivery Method: Simple face mask Preoxygenation: Pre-oxygenation with 100% oxygen Induction Type: IV induction Placement Confirmation: positive ETCO2

## 2020-12-25 NOTE — Progress Notes (Signed)
Inpatient Diabetes Program Recommendations  AACE/ADA: New Consensus Statement on Inpatient Glycemic Control   Target Ranges:  Prepandial:   less than 140 mg/dL      Peak postprandial:   less than 180 mg/dL (1-2 hours)      Critically ill patients:  140 - 180 mg/dL   Results for [name], [name] (MRN [id number]) as of 12/25/2020 09:57  Ref. Range 12/24/2020 06:16  12/24/2020 11:16 12/24/2020 15:58 12/24/2020 16:54 12/24/2020 21:25 12/25/2020 05:43  Glucose-Capillary Latest Ref Range: 70 - 99 mg/dL 103 (H)    Glipzide 10 mg @7 :44 Farxiga 10 mg @9 :25 Lantus 8 units @9 :25 150 (H)  Novolog 2 units 53 (L) 88 194 (H)  Novolog 4 units  Lantus 8 units 114 (H)   Review of Glycemic Control  Diabetes history: DM2 Outpatient Diabetes medications: Lantus 8 units BID, Farxiga 10 mg daily, Glipizide 10 mg BID Current orders for Inpatient glycemic control: Novolog 0-24 units AC&HS, Farxiga 10 QAM,, Glipizide 10 mg QAM, Lantus 8 units BID  Inpatient Diabetes Program Recommendations:    Insulin: Please consider decreasing Novolog correction to Novolog 0-15 units AC&HS.  Thanks, Barnie Alderman, RN, MSN, CDE Diabetes Coordinator Inpatient Diabetes Program [phone] (Team Pager from 8am to 5pm)

## 2020-12-25 NOTE — Plan of Care (Signed)

## 2020-12-26 ENCOUNTER — Encounter (HOSPITAL_COMMUNITY): Payer: Self-pay | Admitting: Cardiothoracic Surgery

## 2020-12-26 LAB — GLUCOSE, CAPILLARY
Glucose-Capillary: 96 mg/dL (ref 70–99)
Glucose-Capillary: 106 mg/dL — ABNORMAL HIGH (ref 70–99)
Glucose-Capillary: 70 mg/dL (ref 70–99)
Glucose-Capillary: 90 mg/dL (ref 70–99)
Glucose-Capillary: 222 mg/dL — ABNORMAL HIGH (ref 70–99)

## 2020-12-26 MED ORDER — KETOROLAC TROMETHAMINE 15 MG/ML IJ SOLN
7.5000 mg | Freq: Four times a day (QID) | INTRAMUSCULAR | Status: AC
  Administered 2020-12-27: 7.5 mg via INTRAVENOUS
  Filled 2020-12-26: qty 1

## 2020-12-26 MED ORDER — SODIUM CHLORIDE 0.9 % IV SOLN
6.2500 mg | Freq: Four times a day (QID) | INTRAVENOUS | Status: DC | PRN
  Administered 2020-12-26: 6.25 mg via INTRAVENOUS
  Filled 2020-12-26: qty 0.25

## 2020-12-26 MED ORDER — THIAMINE HCL 100 MG/ML IJ SOLN
Freq: Once | INTRAVENOUS | Status: AC
  Filled 2020-12-26: qty 1000

## 2020-12-26 NOTE — Progress Notes (Signed)
Patient refused to take medication until getting nausea medication. Patient received prn phenergan IVPB then her nausea was getting better. She started to drink water & juice, still feel okay then she wanted to have regular food. Put in the order and continue to monitor her nausea. HS [employer]

## 2020-12-26 NOTE — Progress Notes (Signed)
      [address]       [place] [address]             [phone]      1 Day Post-Op Procedure(s) (LRB): STERNAL WOUND IRRIGATION AND DEBRIDEMENT (N/A) WOUND VAC CHANGE (N/A) STERNAL CLOSURE (N/A) Subjective: C/o itching and nausea- threw up a couple times yesterday  Objective: Vital signs in last 24 hours: Temp:  [97.5 F (36.4 C)-98.3 F (36.8 C)] 98 F (36.7 C) (05/18 0300) Pulse Rate:  [76-86] 83 (05/18 0300) Cardiac Rhythm: Normal sinus rhythm (05/17 1900) Resp:  [14-22] 15 (05/18 0300) BP: (92-136)/(60-79) 107/65 (05/18 0300) SpO2:  [88 %-98 %] 92 % (05/18 0300)  Hemodynamic parameters for last 24 hours:    Intake/Output from previous day: 05/17 0701 - 05/18 0700 In: 400 [I.V.:300; IV Piggyback:100] Out: 535 [Urine:500; Drains:20; Blood:15] Intake/Output this shift: No intake/output data recorded.  General appearance: alert, cooperative and no distress Heart: regular rate and rhythm Lungs: dim in bases Abdomen: benign Wound: provena in place   Lab Results: No results for input(s): WBC, HGB, HCT, PLT in the last 72 hours. BMET: Recent Labs    12/23/20 1204 12/24/20 0842  NA 133* 135  K 4.3 4.0  CL 98 100  CO2 27 27  GLUCOSE 144* 157*  BUN 26* 29*  CREATININE 1.03* 1.15*  CALCIUM 8.8* 8.6*    PT/INR: No results for input(s): LABPROT, INR in the last 72 hours. ABG    Component Value Date/Time   PHART 7.364 10/09/2020 2158   HCO3 23.9 10/09/2020 2158   TCO2 24 12/14/2020 1629   ACIDBASEDEF 1.0 10/09/2020 2158   O2SAT 59.6 10/27/2020 0530   CBG (last 3)  Recent Labs    12/25/20 2109 12/25/20 2222 12/26/20 0545  GLUCAP 82 105* 96    Meds Scheduled Meds: . aspirin EC  81 mg Oral Daily  . atorvastatin  80 mg Oral Daily  . bisacodyl  10 mg Oral Daily  . carvedilol  6.25 mg Oral BID WC  . dapagliflozin propanediol  10 mg Oral Daily  . enoxaparin (LOVENOX) injection  40 mg Subcutaneous Q24H  . gabapentin  200 mg Oral  TID  . glipiZIDE  10 mg Oral QAC breakfast  . insulin aspart  0-24 Units Subcutaneous TID AC & HS  . insulin glargine  8 Units Subcutaneous BID  . isosorbide-hydrALAZINE  1 tablet Oral TID  . multivitamin with minerals  1 tablet Oral Daily  . sacubitril-valsartan  1 tablet Oral BID  . senna-docusate  1 tablet Oral QHS  . sodium chloride flush  10-40 mL Intracatheter Q12H  . spironolactone  25 mg Oral Daily  . torsemide  40 mg Oral Daily   Continuous Infusions: . sodium chloride 250 mL (12/15/20 0109)  . ceFEPime (MAXIPIME) IV 2 g (12/26/20 0707)   PRN Meds:.sodium chloride, acetaminophen, albuterol, diphenhydrAMINE, ondansetron (ZOFRAN) IV, oxyCODONE, sodium chloride flush, traMADol  Xrays No results found.  Assessment/Plan: S/P Procedure(s) (LRB): STERNAL WOUND IRRIGATION AND DEBRIDEMENT (N/A) WOUND VAC CHANGE (N/A) STERNAL CLOSURE (N/A)  1 afeb, VSS 2 sats ok on RA 3 no new labs 4 conts provena- will discuss plans with MD 5 prn benadryl/zofran     LOS: 12 days    [name] PA-C Pager [phone] 12/26/2020

## 2020-12-26 NOTE — Anesthesia Postprocedure Evaluation (Signed)
Anesthesia Post Note  Patient: Tiffany Le  Procedure(s) Performed: STERNAL WOUND IRRIGATION AND DEBRIDEMENT (N/A Chest) WOUND VAC CHANGE (N/A Chest) STERNAL CLOSURE (N/A Chest)     Patient location during evaluation: PACU Anesthesia Type: MAC Level of consciousness: awake and alert Pain management: pain level controlled Vital Signs Assessment: post-procedure vital signs reviewed and stable Respiratory status: spontaneous breathing, nonlabored ventilation, respiratory function stable and patient connected to nasal cannula oxygen Cardiovascular status: stable and blood pressure returned to baseline Postop Assessment: no apparent nausea or vomiting Anesthetic complications: no   No complications documented.  Last Vitals:  Vitals:   12/25/20 2300 12/26/20 0300  BP: 92/60 107/65  Pulse: 85 83  Resp: 14 15  Temp: 36.7 C 36.7 C  SpO2: 93% 92%    Last Pain:  Vitals:   12/26/20 0300  TempSrc: Oral  PainSc: 0-No pain                 Tiajuana Amass

## 2020-12-27 LAB — GLUCOSE, CAPILLARY
Glucose-Capillary: 121 mg/dL — ABNORMAL HIGH (ref 70–99)
Glucose-Capillary: 167 mg/dL — ABNORMAL HIGH (ref 70–99)
Glucose-Capillary: 232 mg/dL — ABNORMAL HIGH (ref 70–99)
Glucose-Capillary: 56 mg/dL — ABNORMAL LOW (ref 70–99)
Glucose-Capillary: 58 mg/dL — ABNORMAL LOW (ref 70–99)
Glucose-Capillary: 69 mg/dL — ABNORMAL LOW (ref 70–99)
Glucose-Capillary: 126 mg/dL — ABNORMAL HIGH (ref 70–99)

## 2020-12-27 MED ORDER — HEPARIN SODIUM (PORCINE) 1000 UNIT/ML IJ SOLN
INTRAMUSCULAR | Status: AC
  Filled 2020-12-27: qty 4

## 2020-12-27 MED ORDER — HYDROCORTISONE 1 % EX LOTN
TOPICAL_LOTION | Freq: Three times a day (TID) | CUTANEOUS | Status: DC | PRN
  Filled 2020-12-27: qty 118

## 2020-12-27 MED ORDER — HYDROCORTISONE 1 % EX CREA
TOPICAL_CREAM | Freq: Three times a day (TID) | CUTANEOUS | Status: DC | PRN
  Filled 2020-12-27: qty 28

## 2020-12-27 NOTE — Progress Notes (Signed)
Mobility Specialist - Progress Note   12/27/20 1432  Mobility  Activity Ambulated in hall  Level of Assistance Independent after set-up  Assistive Device  (IV stand)  Distance Ambulated (ft) 400 ft  Mobility Ambulated with assistance in hallway  Mobility Response Tolerated well  Mobility performed by Mobility specialist  $Mobility charge 1 Mobility   Pt asx throughout ambulation. Pt sitting up on edge of bed after walk, call bell at side. VSS throughout.  Pricilla Handler Mobility Specialist Mobility Specialist Phone: [phone]

## 2020-12-27 NOTE — Plan of Care (Signed)

## 2020-12-27 NOTE — Progress Notes (Addendum)
[address]       [place] [address]             [phone]      2 Days Post-Op Procedure(s) (LRB): STERNAL WOUND IRRIGATION AND DEBRIDEMENT (N/A) WOUND VAC CHANGE (N/A) STERNAL CLOSURE (N/A) Subjective: Feels much better, vomiting resolved, still a lot of itching  Objective: Vital signs in last 24 hours: Temp:  [97.7 F (36.5 C)-98.8 F (37.1 C)] 98.5 F (36.9 C) (05/19 0335) Pulse Rate:  [79-91] 85 (05/19 0335) Cardiac Rhythm: Normal sinus rhythm (05/18 1901) Resp:  [15-19] 16 (05/19 0335) BP: (93-125)/(55-73) 93/56 (05/19 0335) SpO2:  [96 %-99 %] 96 % (05/19 0335)  Hemodynamic parameters for last 24 hours:    Intake/Output from previous day: 05/18 0701 - 05/19 0700 In: 1470 [P.O.:920; IV Piggyback:550] Out: 15 [Drains:15] Intake/Output this shift: No intake/output data recorded.  General appearance: alert, cooperative and no distress Wound: provena in place  Lab Results: No results for input(s): WBC, HGB, HCT, PLT in the last 72 hours. BMET: Recent Labs    12/24/20 0842  NA 135  K 4.0  CL 100  CO2 27  GLUCOSE 157*  BUN 29*  CREATININE 1.15*  CALCIUM 8.6*    PT/INR: No results for input(s): LABPROT, INR in the last 72 hours. ABG    Component Value Date/Time   PHART 7.364 10/09/2020 2158   HCO3 23.9 10/09/2020 2158   TCO2 24 12/14/2020 1629   ACIDBASEDEF 1.0 10/09/2020 2158   O2SAT 59.6 10/27/2020 0530   CBG (last 3)  Recent Labs    12/26/20 1704 12/26/20 2109 12/27/20 0609  GLUCAP 90 222* 121*    Meds Scheduled Meds: . aspirin EC  81 mg Oral Daily  . atorvastatin  80 mg Oral Daily  . bisacodyl  10 mg Oral Daily  . carvedilol  6.25 mg Oral BID WC  . dapagliflozin propanediol  10 mg Oral Daily  . enoxaparin (LOVENOX) injection  40 mg Subcutaneous Q24H  . gabapentin  200 mg Oral TID  . glipiZIDE  10 mg Oral QAC breakfast  . insulin aspart  0-24 Units Subcutaneous TID AC & HS  . insulin glargine  8 Units  Subcutaneous BID  . isosorbide-hydrALAZINE  1 tablet Oral TID  . ketorolac  7.5 mg Intravenous Q6H  . multivitamin with minerals  1 tablet Oral Daily  . sacubitril-valsartan  1 tablet Oral BID  . senna-docusate  1 tablet Oral QHS  . sodium chloride flush  10-40 mL Intracatheter Q12H  . spironolactone  25 mg Oral Daily  . torsemide  40 mg Oral Daily   Continuous Infusions: . sodium chloride 250 mL (12/15/20 0109)  . ceFEPime (MAXIPIME) IV 2 g (12/27/20 0646)  . promethazine (PHENERGAN) injection (IM or IVPB) 6.25 mg (12/26/20 1158)   PRN Meds:.sodium chloride, acetaminophen, albuterol, diphenhydrAMINE, ondansetron (ZOFRAN) IV, oxyCODONE, promethazine (PHENERGAN) injection (IM or IVPB), sodium chloride flush, traMADol  Xrays No results found.  Assessment/Plan: S/P Procedure(s) (LRB): STERNAL WOUND IRRIGATION AND DEBRIDEMENT (N/A) WOUND VAC CHANGE (N/A) STERNAL CLOSURE (N/A)  1 afeb, VSS sBP 90's to 120's 2 sats good on RA 3 CBG 70's - 200's 4 conts wound care with provena 5 will stop maxepime- has completed 10 days- wound closed, may be contrib to itching with PCN allergy could have cephalosporin cross reactivity    LOS: 13 days    [name] PA-C Pager [phone] 12/27/2020 Pt seen and examined; check  drain output today--may be able to remove later Likely discharge home tomorrow with portable Prevena in place--I will f/u in office early next week Dezmond Downie Z. Orvan Seen, [place]

## 2020-12-28 ENCOUNTER — Other Ambulatory Visit: Payer: Self-pay

## 2020-12-28 ENCOUNTER — Other Ambulatory Visit: Payer: Self-pay | Admitting: *Deleted

## 2020-12-28 LAB — GLUCOSE, CAPILLARY
Glucose-Capillary: 85 mg/dL (ref 70–99)
Glucose-Capillary: 168 mg/dL — ABNORMAL HIGH (ref 70–99)

## 2020-12-28 MED ORDER — TRAMADOL HCL 50 MG PO TABS
50.0000 mg | ORAL_TABLET | Freq: Four times a day (QID) | ORAL | 0 refills | Status: AC | PRN
  Filled 2020-12-28: qty 28, 7d supply, fill #0

## 2020-12-28 MED ORDER — CARVEDILOL 6.25 MG PO TABS
6.2500 mg | ORAL_TABLET | Freq: Two times a day (BID) | ORAL | 1 refills | Status: DC
  Filled 2020-12-28: qty 60, 30d supply, fill #0

## 2020-12-28 MED ORDER — TORSEMIDE 20 MG PO TABS: 40.0000 mg | ORAL_TABLET | Freq: Every day | ORAL | Status: DC

## 2020-12-28 MED ORDER — SACUBITRIL-VALSARTAN 49-51 MG PO TABS
1.0000 | ORAL_TABLET | Freq: Two times a day (BID) | ORAL | 3 refills | Status: DC
  Filled 2020-12-28: qty 60, 30d supply, fill #0

## 2020-12-28 NOTE — Plan of Care (Signed)

## 2020-12-28 NOTE — Progress Notes (Signed)
      [address]       [place] [address]             [phone]      3 Days Post-Op Procedure(s) (LRB): STERNAL WOUND IRRIGATION AND DEBRIDEMENT (N/A) WOUND VAC CHANGE (N/A) STERNAL CLOSURE (N/A) Subjective: Feels ok  Objective: Vital signs in last 24 hours: Temp:  [98.1 F (36.7 C)-98.9 F (37.2 C)] 98.5 F (36.9 C) (05/20 0722) Pulse Rate:  [81-93] 93 (05/20 0722) Cardiac Rhythm: Normal sinus rhythm (05/20 0738) Resp:  [11-17] 11 (05/20 0722) BP: (97-119)/(56-71) 110/63 (05/20 0722) SpO2:  [96 %-97 %] 97 % (05/20 0722)  Hemodynamic parameters for last 24 hours:    Intake/Output from previous day: 05/19 0701 - 05/20 0700 In: 600 [P.O.:600] Out: 11 [Urine:1; Drains:10] Intake/Output this shift: No intake/output data recorded.  General appearance: alert, cooperative and no distress Wound: provena in place  Lab Results: No results for input(s): WBC, HGB, HCT, PLT in the last 72 hours. BMET: No results for input(s): NA, K, CL, CO2, GLUCOSE, BUN, CREATININE, CALCIUM in the last 72 hours.  PT/INR: No results for input(s): LABPROT, INR in the last 72 hours. ABG    Component Value Date/Time   PHART 7.364 10/09/2020 2158   HCO3 23.9 10/09/2020 2158   TCO2 24 12/14/2020 1629   ACIDBASEDEF 1.0 10/09/2020 2158   O2SAT 59.6 10/27/2020 0530   CBG (last 3)  Recent Labs    12/27/20 2152 12/27/20 2231 12/28/20 0604  GLUCAP 69* 126* 85    Meds Scheduled Meds: . aspirin EC  81 mg Oral Daily  . atorvastatin  80 mg Oral Daily  . bisacodyl  10 mg Oral Daily  . carvedilol  6.25 mg Oral BID WC  . dapagliflozin propanediol  10 mg Oral Daily  . enoxaparin (LOVENOX) injection  40 mg Subcutaneous Q24H  . gabapentin  200 mg Oral TID  . glipiZIDE  10 mg Oral QAC breakfast  . insulin aspart  0-24 Units Subcutaneous TID AC & HS  . insulin glargine  8 Units Subcutaneous BID  . isosorbide-hydrALAZINE  1 tablet Oral TID  . multivitamin with minerals  1  tablet Oral Daily  . sacubitril-valsartan  1 tablet Oral BID  . senna-docusate  1 tablet Oral QHS  . sodium chloride flush  10-40 mL Intracatheter Q12H  . spironolactone  25 mg Oral Daily  . torsemide  40 mg Oral Daily   Continuous Infusions: . sodium chloride 250 mL (12/15/20 0109)  . promethazine (PHENERGAN) injection (IM or IVPB) 6.25 mg (12/26/20 1158)   PRN Meds:.sodium chloride, acetaminophen, albuterol, diphenhydrAMINE, hydrocortisone cream, ondansetron (ZOFRAN) IV, oxyCODONE, promethazine (PHENERGAN) injection (IM or IVPB), sodium chloride flush, traMADol  Xrays No results found.  Assessment/Plan: S/P Procedure(s) (LRB): STERNAL WOUND IRRIGATION AND DEBRIDEMENT (N/A) WOUND VAC CHANGE (N/A) STERNAL CLOSURE (N/A)  1 afeb, VSS 2 sats good on RA 3 no new labs, sugars running a bit low at times- will hold this am glipizide  4 plan as I understand is for Dr Orvan Seen to remove VAC (provena) and observe the wound- poss d/c today if looks ok. Will remove JP and d/c midline   LOS: 14 days    [name] PA-C Pager [phone] 12/28/2020

## 2020-12-28 NOTE — Plan of Care (Signed)

## 2020-12-28 NOTE — Op Note (Signed)
Procedure(s): STERNAL WOUND IRRIGATION AND DEBRIDEMENT WOUND VAC CHANGE STERNAL CLOSURE Procedure Note  [place] female 45 y.o. 12/25/20 Procedure(s) and Anesthesia Type:    * STERNAL WOUND IRRIGATION AND DEBRIDEMENT - General    * WOUND VAC CHANGE - General    * STERNAL CLOSURE - General  Surgeon(s) and Role:    * [name], MD - Primary   Indications: The patient is status post CABG which resulted in a superficial sternal infection.  This is been managed for approximately 10 days with [facility] therapy and with the assistance of plastic surgery wound care.  She is taken the operating room today for sternal debridement and possible closure     Surgeon: [name]   Assistants: Staff  Anesthesia: General endotracheal anesthesia  ASA Class: 3    Procedure Detail  STERNAL WOUND IRRIGATION AND DEBRIDEMENT, WOUND VAC CHANGE, STERNAL CLOSURE After informed consent, the patient taken the operating room and the above listed date.  She placed in supine position.  Anesthesia confirmed by the general endotracheal technique.  The anterior chest was cleansed and draped as a sterile field using Betadine solution. A preop surgical pause was performed. The superficial sternal defect was inspected thoroughly irrigated.  He has good granulation tissue evident throughout.  Decision made to close the incision.  A 10 [place] Blake drain was inserted at the base of the wound.  Horizontal mattress sutures of #2 nylon placed intermittently along the length of the open defect.  This brought the wound together again effectively.  The subdermal tissue was reapproximated using interrupted suture of Vicryl.  The skin was approximated with subcuticular suture.  A wound VAC was placed in the closed incision as a sterile dressing.  All sponge instrument needle counts are correct and I was present for all aspects procedure  Estimated Blood Loss:  less than 50 mL         Drains: 10 [place] Blake      Blood Given: none          Specimens: Nonenone         Implants: none        Complications:  * No complications entered in OR log *         Disposition: PACU - hemodynamically stable.         Condition: stable

## 2020-12-28 NOTE — Plan of Care (Signed)
  Problem: Education: Goal: Knowledge of General Education information will improve Description: Including pain rating scale, medication(s)/side effects and non-pharmacologic comfort measures 12/28/2020 1144 by Leonie Man, RN Outcome: Adequate for Discharge 12/28/2020 1020 by Leonie Man, RN Outcome: Adequate for Discharge   Problem: Health Behavior/Discharge Planning: Goal: Ability to manage health-related needs will improve 12/28/2020 1144 by Leonie Man, RN Outcome: Adequate for Discharge 12/28/2020 1020 by Leonie Man, RN Outcome: Adequate for Discharge   Problem: Clinical Measurements: Goal: Ability to maintain clinical measurements within normal limits will improve 12/28/2020 1144 by Leonie Man, RN Outcome: Adequate for Discharge 12/28/2020 1020 by Leonie Man, RN Outcome: Adequate for Discharge Goal: Will remain free from infection 12/28/2020 1144 by Leonie Man, RN Outcome: Adequate for Discharge 12/28/2020 1020 by Leonie Man, RN Outcome: Adequate for Discharge Goal: Diagnostic test results will improve 12/28/2020 1144 by Leonie Man, RN Outcome: Adequate for Discharge 12/28/2020 1020 by Leonie Man, RN Outcome: Adequate for Discharge Goal: Respiratory complications will improve 12/28/2020 1144 by Leonie Man, RN Outcome: Adequate for Discharge 12/28/2020 1020 by Leonie Man, RN Outcome: Adequate for Discharge Goal: Cardiovascular complication will be avoided 12/28/2020 1144 by Leonie Man, RN Outcome: Adequate for Discharge 12/28/2020 1020 by Leonie Man, RN Outcome: Adequate for Discharge   Problem: Activity: Goal: Risk for activity intolerance will decrease 12/28/2020 1144 by Leonie Man, RN Outcome: Adequate for Discharge 12/28/2020 1020 by Leonie Man, RN Outcome: Adequate for Discharge   Problem: Nutrition: Goal: Adequate nutrition will be maintained 12/28/2020 1144 by Leonie Man, RN Outcome: Adequate for Discharge 12/28/2020 1020  by Leonie Man, RN Outcome: Adequate for Discharge   Problem: Coping: Goal: Level of anxiety will decrease 12/28/2020 1144 by Leonie Man, RN Outcome: Adequate for Discharge 12/28/2020 1020 by Leonie Man, RN Outcome: Adequate for Discharge   Problem: Elimination: Goal: Will not experience complications related to bowel motility 12/28/2020 1144 by Leonie Man, RN Outcome: Adequate for Discharge 12/28/2020 1020 by Leonie Man, RN Outcome: Adequate for Discharge Goal: Will not experience complications related to urinary retention 12/28/2020 1144 by Leonie Man, RN Outcome: Adequate for Discharge 12/28/2020 1020 by Leonie Man, RN Outcome: Adequate for Discharge   Problem: Pain Managment: Goal: General experience of comfort will improve 12/28/2020 1144 by Leonie Man, RN Outcome: Adequate for Discharge 12/28/2020 1020 by Leonie Man, RN Outcome: Adequate for Discharge   Problem: Safety: Goal: Ability to remain free from injury will improve 12/28/2020 1144 by Leonie Man, RN Outcome: Adequate for Discharge 12/28/2020 1020 by Leonie Man, RN Outcome: Adequate for Discharge   Problem: Skin Integrity: Goal: Risk for impaired skin integrity will decrease 12/28/2020 1144 by Leonie Man, RN Outcome: Adequate for Discharge 12/28/2020 1020 by Leonie Man, RN Outcome: Adequate for Discharge

## 2020-12-28 NOTE — Discharge Summary (Signed)
[address]       [place] [address]             [phone]    Physician Discharge Summary  Patient ID: Tiffany Le MRN: [id number] DOB/AGE: 09/02/1975 45 y.o.  Admit date: 12/14/2020 Discharge date: 12/28/2020  Admission Diagnoses: Sternal wound dehiscence  Patient Active Problem List   Diagnosis Date Noted  . Sternal wound dehiscence 12/14/2020  . 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  . Pulmonary edema 10/02/2020  . Acute respiratory failure with hypoxia ([place])   . Coronary artery disease involving native coronary artery of native heart with unstable angina pectoris ([place])   . Acute on chronic combined systolic (congestive) and diastolic (congestive) heart failure ([place])   . NSTEMI (non-ST elevated myocardial infarction) ([place]) 09/25/2020  . Uncontrolled diabetes mellitus with complication, without long-term current use of insulin ([place])   . Shortness of breath 12/25/2019  . Hemoglobin A1C greater than 9%, indicating poor diabetic control 12/25/2019  . Sepsis due to urinary tract infection ([place]) 12/03/2019  . Sepsis secondary to UTI ([place]) 12/02/2019  . Class 3 severe obesity due to excess calories with serious comorbidity and body mass index (BMI) of 40.0 to 44.9 in adult ([place]) 10/24/2018  . Back pain 10/24/2018  . Elevated LFTs 01/13/2014  . Unspecified vitamin D deficiency 01/13/2014  . Anemia, iron deficiency 11/17/2012  . Essential hypertension, benign 11/17/2012  . Pure hypercholesterolemia 11/17/2012     Discharge Diagnoses:  Patient Active Problem List   Diagnosis Date Noted  . Sternal wound dehiscence 12/14/2020  . 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  . Pulmonary edema 10/02/2020  . Acute respiratory failure with hypoxia ([place])   . Coronary artery disease involving native coronary artery of  native heart with unstable angina pectoris ([place])   . Acute on chronic combined systolic (congestive) and diastolic (congestive) heart failure ([place])   . NSTEMI (non-ST elevated myocardial infarction) ([place]) 09/25/2020  . Uncontrolled diabetes mellitus with complication, without long-term current use of insulin ([place])   . Shortness of breath 12/25/2019  . Hemoglobin A1C greater than 9%, indicating poor diabetic control 12/25/2019  . Sepsis due to urinary tract infection ([place]) 12/03/2019  . Sepsis secondary to UTI ([place]) 12/02/2019  . Class 3 severe obesity due to excess calories with serious comorbidity and body mass index (BMI) of 40.0 to 44.9 in adult ([place]) 10/24/2018  . Back pain 10/24/2018  . Elevated LFTs 01/13/2014  . Unspecified vitamin D deficiency 01/13/2014  . Anemia, iron deficiency 11/17/2012  . Essential hypertension, benign 11/17/2012  . Pure hypercholesterolemia 11/17/2012     Discharged Condition: good  History of Present Illness:  [place] is a 45 yo obese AA female well known to [facility].  She is S/P is CABG x 4 performed by Dr. Orvan Seen at 10/10/2020.  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  The patient contacted our office this morning (5/6) with complaints of poorly healing sternal wound.  She states that she had been doing local wound care, but unfortunately her wound had gotten progressively worse.  The patient denied fever, warmth and reddness.  She did mention the wound had foul smelling drainage present.  She sent a picture to our office nurse which was reviewed by the  PA on call.  It was felt the patient would require admission and OR debridement.  Hospital Course:  Patient was taken to the operating room by Dr. Roxy Manns.  He performed excisional debridement of the sternal wound with application of a wound vac.  She tolerated the procedure without difficulty, was extubated, and taken to the PACU in stable  condition.  The patients OR culture grew Serratia Marcescens.  She was started on IV Rocephin for this.  She was restarted on her home regimen of heart failure medications and diuretics.  The patient developed nausea and vomiting.  She was treated with anti-emetics and responded well to this.  She was taken back to the OR for wound vac change on 12/18/2020.  She was then again taken to the OR after being seen by plastics on 12/20/2000 and Dr. Oletha Blend placed a cell, VAC and ABRA.  The wound has continued to show good healing and the patient was returned to the OR on 12/25/2020 by Dr. Orvan Seen where he was closed and a Prevena wound dressing device was placed over it.  She is completed a 10-day course of meropenem.  Patient remains medically stable and on 12/28/2020 the Praveena was removed by Dr. Orvan Seen and an aqua seal dressing was placed.  Plans are for discharge on today's date and early follow-up next week.  The wound JP drain has been removed.     Consults: plastic surgery  Significant Diagnostic Studies: routine post-op labs   Treatments: surgery: multiple CARDIOTHORACIC SURGERY OPERATIVE NOTE  Date of Procedure:                            12/14/2020  Preoperative Diagnosis:                  Sternal Wound Dehiscence  Postoperative Diagnosis:                same  Procedure:                                           Excisional Debridement of Sternal Wound  Application of Negative Pressure Wound Vacuum Assisted Closure Device  Surgeon:                                            Valentina Gu. Roxy Manns, MD  Anesthesia:                                        General  Operative Findings:   ? Extensive liquified necrosis of subcutaneous fat extending to anterior table of sternum ? No sign of sternal wire dehiscence ? No obvious signs of deep sternal wound infection   Procedure(s): STERNAL WOUND DEBRIDEMENT, Wound Vac Change Procedure Note  Tiffany Le female 45  y.o. 12/18/2020  Procedure(s) and Anesthesia Type:    * STERNAL WOUND DEBRIDEMENT, Wound Vac Change - General  Surgeon(s) and Role:    * [name], MD - Primary   Indications: The patient was admitted to the hospital with superficial sternal infection status post CABG.  She is taken to the operating room for [facility] change and further  debridement     Surgeon: [name]   Assistants: StaffGeneral endotracheal anesthesia  Anesthesia: General endotracheal anesthesia  ASA Class: 3  DATE OF OPERATION: 12/20/2020  LOCATION: [facility] Inpatient  PREOPERATIVE DIAGNOSIS: sternal wound   POSTOPERATIVE DIAGNOSIS: Same  PROCEDURE: preparation of sternal wound 10 x 16 cm for placement of Acell 10 x 15 cm sheet, 1 gm powder, VAC and ABRA  SURGEON: Claire H. J. Heinz, DO  ASSISTANT: Roetta Sessions, ,PA  EBL: none  CONDITION: Stable  COMPLICATIONS: None    1/61/0960  3:29 PM  PATIENT:  [name]  45 y.o. female  PRE-OPERATIVE DIAGNOSIS:  Sternal wound infection  POST-OPERATIVE DIAGNOSIS:  Sternal wound infection  PROCEDURE:  Procedure(s): STERNAL WOUND IRRIGATION AND DEBRIDEMENT (N/A) WOUND VAC CHANGE (N/A) STERNAL CLOSURE (N/A)  Secondary closure of superficial sternal defect Application of Prevena management system  SURGEON:  Surgeon(s) and Role:    [name], MD - Primary   Discharge Exam: Blood pressure 110/63, pulse 93, temperature 98.5 F (36.9 C), temperature source Oral, resp. rate 11, height 5\' 4"  (1.626 m), weight 107.9 kg, last menstrual period 12/19/2020, SpO2 97 %.    General appearance: alert, cooperative and no distress Wound: provena in place   Discharge Instructions    Discharge patient   Complete by: As directed    Discharge disposition: 01-Home or Self Care   Discharge patient date: 12/28/2020     Allergies as of 12/28/2020      Reactions   Penicillins Rash    Historical Has patient had a PCN reaction causing immediate rash, facial/tongue/throat swelling, SOB or lightheadedness with hypotension: No Has patient had a PCN reaction causing severe rash involving mucus membranes or skin necrosis: No Has patient had a PCN reaction that required hospitalization: No Has patient had a PCN reaction occurring within the last 10 years: No If all of the above answers are "NO", then may proceed with Cephalosporin use. Has tolerated cephalosporins      Medication List    STOP taking these medications   furosemide 40 MG tablet Commonly known as: LASIX   Insulin Pen Needle 29G X 5MM Misc   lisinopril 10 MG tablet Commonly known as: ZESTRIL   metoprolol tartrate 50 MG tablet Commonly known as: LOPRESSOR   silver sulfADIAZINE 1 % cream Commonly known as: SILVADENE     TAKE these medications   acetaminophen 325 MG tablet Commonly known as: TYLENOL Take 2 tablets (650 mg total) by mouth every 6 (six) hours as needed for mild pain or headache.   albuterol 108 (90 Base) MCG/ACT inhaler Commonly known as: Proventil HFA Inhale 2 puffs into the lungs every 6 (six) hours as needed for wheezing.   aspirin EC 81 MG tablet Take 81 mg by mouth daily.   atorvastatin 80 MG tablet Commonly known as: LIPITOR TAKE 1 TABLET (80 MG TOTAL) BY MOUTH DAILY.   carvedilol 6.25 MG tablet Commonly known as: COREG Take 1 tablet (6.25 mg total) by mouth 2 (two) times daily with a meal. What changed:   medication strength  how much to take  how to take this  when to take this   clopidogrel 75 MG tablet Commonly known as: PLAVIX Take 1 tablet (75 mg total) by mouth daily.   dapagliflozin propanediol 10 MG Tabs tablet Commonly known as: FARXIGA Take 1 tablet (10 mg total) by mouth daily.   gabapentin 100 MG capsule Commonly known as: NEURONTIN Take 1  capsule (100 mg total) by mouth 3 (three) times daily.   glipiZIDE 10 MG tablet Commonly known as:  GLUCOTROL TAKE 1 TABLET BY MOUTH TWICE DAILY BEFORE A MEAL What changed:   how much to take  how to take this  when to take this  additional instructions   isosorbide-hydrALAZINE 20-37.5 MG tablet Commonly known as: BIDIL Take 0.5 tablets by mouth 3 (three) times daily.   Lantus SoloStar 100 UNIT/ML Solostar Pen Generic drug: insulin glargine Inject 8 Units into the skin 2 (two) times daily.   multivitamin with minerals Tabs tablet Take 1 tablet by mouth daily.   nitroGLYCERIN 0.4 MG SL tablet Commonly known as: NITROSTAT DISSOLVE 1 TABLET UNDER THE TONGUE EVERY 5 MINUTES FOR 3 DOSES AS NEEDED FOR CHEST PAIN What changed: See the new instructions.   sacubitril-valsartan 49-51 MG Commonly known as: ENTRESTO TAKE 1 TABLET BY MOUTH TWO TIMES DAILY. What changed: Another medication with the same name was removed. Continue taking this medication, and follow the directions you see here.   spironolactone 25 MG tablet Commonly known as: ALDACTONE Take 1 tablet (25 mg total) by mouth daily.   torsemide 20 MG tablet Commonly known as: DEMADEX Take 2 tablets (40 mg total) by mouth daily.   traMADol 50 MG tablet Commonly known as: ULTRAM Take 1 tablet (50 mg total) by mouth every 6 (six) hours as needed for up to 7 days (mild pain).       Follow-up Information    [name], MD Follow up.   Specialty: Cardiothoracic Surgery Why: Your routine follow-up appointment is on:  6/2 @1 :30. Please arrive at 1:00p for a chest xray located at [facility] which is on the first floor of our building Contact information: [address] [place] [address] [phone]               Signed: @mec @ 12/28/2020, 10:13 AM

## 2020-12-28 NOTE — Progress Notes (Signed)
Hypoglycemic Event  CBG: 58  Treatment: Pt given sprite and she was eating a snack she had in the room at the time.  Symptoms: asymptomatic  Follow-up CBG: Time:2231 CBG Result: 126  Possible Reasons for Event: Pt had received 8 units of insulin with evening meal that she did not finish. Poor appetite contributed to event.   Comments/MD notified: NA    Theron Arista

## 2020-12-28 NOTE — Patient Outreach (Signed)
[facility]) Care Management  12/28/2020  KEEVA REISEN February 11, 1976 [id number]  [facility] outreach to [facility] stroke patient referred patient, re admission Ms TATISHA CERINO was referred to [facility] on 11/07/20 for EMMI stroke Rosaria Ferries ON EMMI ALERT Day #9Date: 11/06/20 1000 Tuesday Red Alert Reason: Sad, hopeless, anxious, or empty? Yes Issued smoking cessation, thinking about quitting smoking and stroke preventing secondary stroke EMMIs  Insurance:self pay,Medicaid pending- Not on APL [facility] x3ED visits x3in the last 6 months  [facility] top presentcongestive Heart Failure (CHF) 10/02/20 to 3/19/22pulmonary edema with status post (s/p)Coronary artery bypass graft (CABG)x 4 on 10/09/20 09/24/20 to 2/17/22Non ST elevation myocardial infarction (NSTEMI),Hypertension (HTN), uncontrolledDiabetes (DM) type 2without long term current use of insulin    Ms Alire was scheduled for a chart review and 4 pm outreach  With outreach to her she has recently discharged and is awaiting medications with the assistance of her daughter Patient is able to verify HIPAA ([place]) identifiers Reviewed and addressed the purpose of the follow up call with the patient  Consent: [facility] ([place]) RN CM reviewed [facility] services with patient. Patient gave verbal consent for services.  Ms Winkel was encouraged to get home safely and agrees to follow up within the next 1-3 business days.   Transition of care assessment initiated  She was given [facility] RN CM office number and [facility] 24 hour nurse call center number for after hours services She voiced appreciation for the outreach   Patient Active Problem List   Diagnosis Date Noted  . Sternal wound dehiscence 12/14/2020  . 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  . Pulmonary edema  10/02/2020  . Acute respiratory failure with hypoxia ([place])   . Coronary artery disease involving native coronary artery of native heart with unstable angina pectoris ([place])   . Acute on chronic combined systolic (congestive) and diastolic (congestive) heart failure ([place])   . NSTEMI (non-ST elevated myocardial infarction) ([place]) 09/25/2020  . Uncontrolled diabetes mellitus with complication, without long-term current use of insulin ([place])   . Shortness of breath 12/25/2019  . Hemoglobin A1C greater than 9%, indicating poor diabetic control 12/25/2019  . Sepsis due to urinary tract infection ([place]) 12/03/2019  . Sepsis secondary to UTI ([place]) 12/02/2019  . Class 3 severe obesity due to excess calories with serious comorbidity and body mass index (BMI) of 40.0 to 44.9 in adult ([place]) 10/24/2018  . Back pain 10/24/2018  . Elevated LFTs 01/13/2014  . Unspecified vitamin D deficiency 01/13/2014  . Anemia, iron deficiency 11/17/2012  . Essential hypertension, benign 11/17/2012  . Pure hypercholesterolemia 11/17/2012    Plans Patient agrees to care plan and follow up within the next 1-3 business days for post hospital services Pt encouraged to return a call to [facility] RN CM prn Goals Addressed              This Visit's Progress     Patient Stated   .  [facility]) Decrease inpatient admissions/ readmissions (pt-stated)        Timeframe:  Short Range Goal Priority:  High Start Date:                11/26/20             Expected End Date:  01/08/21  Follow Up Date 12/31/20  Work with pcp and cardiac center staff on barriers to home care and for admission prevention   Notes 12/28/20 discharging home from hospital at time of outreach, awaiting assist from daughter to get her medications filled. Agreed on scheduling of another outreach appointment 12/21/20 remains hospitalized s/p sternal wound debridement with skin substitution and abra on 12/20/20  12/17/20 Re admission noted on 12/14/20  for CHF. Collaboration with pcp SW Manuela Schwartz 12/11/20 unsuccessful EMMI stroke follow up attempt 11/26/20 successful outreach denied depression s/s related to CVA. Confirmed related to filling medications but resolved after pcp visit She denied need of [facility] SW and other resources. Her depression screen on 11/26/20 PHQ2= 1         Dior Stepter L. [name], RN, BSN, [place] Coordinator Office number [phone] Main [facility] number [phone] Fax number [phone]

## 2020-12-31 ENCOUNTER — Telehealth (HOSPITAL_COMMUNITY): Payer: Self-pay | Admitting: Cardiology

## 2020-12-31 ENCOUNTER — Other Ambulatory Visit: Payer: Self-pay | Admitting: *Deleted

## 2020-12-31 NOTE — Progress Notes (Signed)
Patient presents for wound check.  She is status post CABG with a superficial dehiscence.  She denies fevers or chills.  She has been dressing the wound at home on her own  Physical exam:BP 132/83   Pulse 90   Temp 97.6 F (36.4 C) (Skin)   Resp 20   Ht 5\' 4"  (1.626 m)   Wt 105.7 kg   LMP  (LMP Unknown)   BMI 39.99 kg/m  The wound is examined and gently debrided Silvadene cream was applied to the eschar.  This is then covered with a clean dressing  impression/plan: Superficial sternal dehiscence; follow-up with [place] next week for wound examination  Mithran Strike Z. Orvan Seen, [place]

## 2020-12-31 NOTE — Patient Outreach (Signed)
[facility]) Care Management  12/31/2020  KIALEE KHAM [birth year] [id number]   [facility] Unsuccessful outreach Ms UNNAMED HINO was referred to [facility] on 11/07/20 for EMMI stroke Rosaria Ferries ON EMMI ALERT Day #9Date: 11/06/20 1000 Tuesday Red Alert Reason: Sad, hopeless, anxious, or empty? Yes Issued smoking cessation, thinking about quitting smoking and stroke preventing secondary stroke EMMIs  Insurance:self pay,Medicaid pending- Not on APL [facility] x3ED visits x3in the last 6 months  [facility] to presentcongestive Heart Failure (CHF) 10/02/20 to 3/19/22pulmonary edema with status post (s/p)Coronary artery bypass graft (CABG)x 4 on 10/09/20 09/24/20 to 2/17/22Non ST elevation myocardial infarction (NSTEMI),Hypertension (HTN), uncontrolledDiabetes (DM) type 2without long term current use of insulin    Outreach attempt to the home number [phone]  No answer. [facility] RN CM left HIPAA [facility] Portability and Accountability Act) compliant voicemail message along with CM's contact info.   Reviewed an [facility] cardiology note indicating patient needing to have Zio heart monitor placed that was pending from 12/26/20- Pt was hospitalized on 12/26/20  She was not discharged until 12/28/20 late evening   Plan: [facility] RN CM scheduled this patient for another call attempt within 4-7 business days  [place] L. [name], RN, BSN, [place] Coordinator Office number [phone] Mobile number [phone]  Main [facility] number [phone] Fax number [phone]

## 2020-12-31 NOTE — Telephone Encounter (Signed)
Per Lyda Jester PA Pt will need to return for Zio AT  Pt scheduled for labs and zio placement 5/18 Pt cancelled appt

## 2020-12-31 NOTE — Telephone Encounter (Signed)
-----   Message from Claudia Pollock sent at 11/09/2020  9:14 AM EDT ----- Regarding: FW: 30 day event monitor See below. Ask [place] what she wants to do  ----- Message ----- From: Jennefer Bravo Sent: 11/08/2020   5:52 PM EDT To: Claudia Pollock Subject: RE: 30 day event monitor                       Hi [place],    Order was put in wrong.  A cardiac event monitor would be CAR05. You would also need to specify to be done at [facility] office on order.   However, patient is a Manufacturing systems engineer currently self pay.  A 30 day cardiac event monitor will cost patient $225. from monitor company if paid within 7 days.  The cost goes up to $350. if not paid within 7 days of applying the monitor.    Her provider may consider changing the order to a [place] [address] ( ZIO AT).  A ZIO AT monitor would be a 14 day live monitor.  [id number] . They offer a  sliding scale patient assistance program where it is possible patient would have a lower out of pocket cost if  any. They also offer 12 month interest free payment plans.  The patient  would need to call [employer] at [phone], select option 4, select option 2, ask to apply for patient assistance program.  They would ask her household income and how many people were in her household.  [employer] would then quote her out of pocket cost and set up payment plan if needed.  If her provider chooses this option just enter order to be done at [address] office.  We will have monitor shipped to patient Fed Ex.  Thanks,  Darrick Penna ----- Message ----- From: Claudia Pollock Sent: 11/08/2020   9:54 AM EDT To: Jennefer Bravo Subject: 30 day event monitor                           GM. We have a pt in [place] who need a 30 day event monitor. Can you please call pt to schedule appointment to come in to apply the monitor. If you are not the person I need to schedule please let me know. If you have any questions please call me @ [phone]  opt 3 thanks  [employer]

## 2021-01-01 ENCOUNTER — Other Ambulatory Visit: Payer: Self-pay | Admitting: *Deleted

## 2021-01-01 NOTE — Patient Outreach (Signed)
[facility]) Care Management  01/01/2021  [name] August 16, 1975 [id number]   [facility] second Unsuccessful outreach [name] was referred to [facility] on 11/07/20 for EMMI stroke Rosaria Ferries ON EMMI ALERT Day #9Date: 11/06/20 1000 Tuesday Red Alert Reason: Sad, hopeless, anxious, or empty? Yes Issued smoking cessation, thinking about quitting smoking and stroke preventing secondary stroke EMMIs  Insurance:self pay,Medicaid pending- Not on APL [facility] x3ED visits x3in the last 6 months  [facility] to presentcongestive Heart Failure (CHF) 10/02/20 to 3/19/22pulmonary edema with status post (s/p)Coronary artery bypass graft (CABG)x 4 on 10/09/20 09/24/20 to 2/17/22Non ST elevation myocardial infarction (NSTEMI),Hypertension (HTN), uncontrolledDiabetes (DM) type 2without long term current use of insulin   last unsuccessful outreach on 12/31/20  Outreach attempt to the home number [phone]  No answer. [facility] RN CM left HIPAA [facility] Portability and Accountability Act) compliant voicemail message along with CM's contact info.   Reviewed an [facility] cardiology note indicating patient needing to have Zio heart monitor placed that was pending from 12/26/20- Pt was hospitalized on 12/26/20  She was not discharged until 12/28/20 late evening   Plan: [facility] RN CM scheduled this patient for another call attempt within 4-7 business days Sent [facility] unsuccessful outreach letter via [employer] L. [name], RN, BSN, [place] Coordinator Office number [phone] Mobile number [phone]  Main [facility] number [phone] Fax number [phone]

## 2021-01-02 ENCOUNTER — Other Ambulatory Visit: Payer: Self-pay | Admitting: Thoracic Surgery (Cardiothoracic Vascular Surgery)

## 2021-01-02 ENCOUNTER — Other Ambulatory Visit: Payer: Self-pay | Admitting: Cardiothoracic Surgery

## 2021-01-02 DIAGNOSIS — Z951 Presence of aortocoronary bypass graft: Secondary | ICD-10-CM

## 2021-01-03 ENCOUNTER — Other Ambulatory Visit: Payer: Self-pay

## 2021-01-03 ENCOUNTER — Ambulatory Visit: Payer: Self-pay | Admitting: Cardiothoracic Surgery

## 2021-01-03 ENCOUNTER — Ambulatory Visit (INDEPENDENT_AMBULATORY_CARE_PROVIDER_SITE_OTHER): Payer: Self-pay | Admitting: Cardiothoracic Surgery

## 2021-01-03 ENCOUNTER — Ambulatory Visit
Admission: RE | Admit: 2021-01-03 | Discharge: 2021-01-03 | Disposition: A | Discharge status: Home / Self Care | Payer: Self-pay | Source: Ambulatory Visit | Attending: Cardiothoracic Surgery | Admitting: Cardiothoracic Surgery

## 2021-01-03 VITALS — BP 159/90 | HR 71 | Resp 20 | Ht 64.0 in | Wt 244.0 lb

## 2021-01-03 DIAGNOSIS — Z951 Presence of aortocoronary bypass graft: Secondary | ICD-10-CM

## 2021-01-03 NOTE — Progress Notes (Signed)
  44 year old lady presents for wound check.  Status post treatment of a superficial sternal infection and subsequent secondary closure.  She has had no fevers or chills at home.  She is kept the dressing with which she was discharged intact since going home.  Physical exam:  BP (!) 159/90   Pulse 71   Resp 20   Ht 5\' 4"  (1.626 m)   Wt 110.7 kg   LMP 12/19/2020   SpO2 95% Comment: RA  BMI 41.88 kg/m  Well-appearing no acute distress The sternal incision is intact.  There is no significant drainage and no surrounding erythema or fluctuance.  Impression/plan: Doing well after secondary sternal closure May  relax sternal precautions Keep breast support in place is much as possible Follow-up early next week for wound check  Duha Abair Z. Orvan Seen, [place]

## 2021-01-08 ENCOUNTER — Telehealth: Payer: Self-pay

## 2021-01-08 ENCOUNTER — Other Ambulatory Visit: Payer: Self-pay | Admitting: Nurse Practitioner

## 2021-01-08 ENCOUNTER — Other Ambulatory Visit: Payer: Self-pay

## 2021-01-08 ENCOUNTER — Other Ambulatory Visit: Payer: Self-pay | Admitting: *Deleted

## 2021-01-08 ENCOUNTER — Other Ambulatory Visit (HOSPITAL_COMMUNITY): Payer: Self-pay | Admitting: Nurse Practitioner

## 2021-01-08 ENCOUNTER — Encounter: Payer: Self-pay | Admitting: *Deleted

## 2021-01-08 ENCOUNTER — Ambulatory Visit: Payer: Self-pay | Admitting: Cardiothoracic Surgery

## 2021-01-08 ENCOUNTER — Encounter: Payer: Self-pay | Admitting: Cardiothoracic Surgery

## 2021-01-08 VITALS — BP 138/84 | HR 100 | Resp 20 | Ht 64.0 in | Wt 251.2 lb

## 2021-01-08 DIAGNOSIS — Z5189 Encounter for other specified aftercare: Secondary | ICD-10-CM

## 2021-01-08 DIAGNOSIS — Z951 Presence of aortocoronary bypass graft: Secondary | ICD-10-CM

## 2021-01-08 MED ORDER — ISOSORB DINITRATE-HYDRALAZINE 20-37.5 MG PO TABS
1.0000 | ORAL_TABLET | Freq: Three times a day (TID) | ORAL | 3 refills | Status: DC
  Filled 2021-01-29: qty 90, 30d supply, fill #0

## 2021-01-08 MED ORDER — SPIRONOLACTONE 25 MG PO TABS
25.0000 mg | ORAL_TABLET | Freq: Every day | ORAL | 11 refills | Status: DC
Start: 2021-01-08 — End: 2021-02-07

## 2021-01-08 MED ORDER — TORSEMIDE 20 MG PO TABS
20.0000 mg | ORAL_TABLET | Freq: Two times a day (BID) | ORAL | 3 refills | Status: DC
  Filled 2021-02-05: qty 60, 30d supply, fill #0
  Filled 2021-02-12 – 2021-02-28 (×2): qty 60, 30d supply, fill #1
  Filled 2021-04-08: qty 60, 30d supply, fill #2

## 2021-01-08 NOTE — Progress Notes (Signed)
  The patient presents for wound check.  She is status post secondary closure after a superficial sternal defect.  She presents to have her retention sutures removed.  She has had no difficulty since last Le.  Physical exam: BP 138/84 (BP Location: Left Arm, Patient Position: Sitting, Cuff Size: Large)   Pulse 100   Resp 20   Ht 5\' 4"  (1.626 m)   Wt 113.9 kg   LMP 12/19/2020   SpO2 100% Comment: RA  BMI 43.12 kg/m  Well-appearing no acute distress Wound is intact with retention sutures are removed.  There is a small ulcerated appearing defect of the right side of the incision in the area of the retention suture.  This is treated with wound gel.  Assessment/plan: Doing well Follow-up in 10 days Continue sternal precautions Tiffany Le Z. Tiffany Le, [place]

## 2021-01-08 NOTE — Telephone Encounter (Signed)
Med refill  bidil spironolactone Torsemide

## 2021-01-08 NOTE — Patient Outreach (Signed)
[facility]) Care Management  01/08/2021  Tiffany Le 11/28/1975 [id number]  [facility] incoming outreach for follow up  Tiffany Tiffany Le was referred to [facility] on 11/07/20 for EMMI stroke Tiffany Le ON EMMI ALERT Day #9Date: 11/06/20 1000 Tuesday Red Alert Reason: Sad, hopeless, anxious, or empty? Yes Issued smoking cessation, thinking about quitting smoking and stroke preventing secondary stroke EMMIs  2 unsuccessful outreaches to patient occurred Pt was reached successful on the third outreach on 11/26/20 in which the red alert reason was addressed. [name] on the day of the [facility] outreach as she was having difficulty obtaining her discharge medications. Pt reported the reason was resolved after calls to the hospital and a visit to her pcp  She denied need of [facility] SW and other resources. Her depression screen on 11/26/20 PHQ2= 1 There was a scheduled follow call attempt to the pt on 12/11/20 that was unsuccessful Prior to the 12/17/20, 12/21/20 & 12/28/20 scheduled outreaches, [facility] RN CM noted inpatient admission  last unsuccessful outreach on 12/31/20   Insurance:self pay,Medicaid pending- Not on APL (All Payer List)   [facility] x[facility] the last 6 months  12/14/20 to  12/28/20 congestive Heart Failure (CHF) 10/02/20 to 3/19/22pulmonary edema with status post (s/p)Coronary artery bypass graft (CABG)x 4 on 10/09/20 09/24/20 to 2/17/22Non ST elevation myocardial infarction (NSTEMI),Hypertension (HTN), uncontrolledDiabetes (DM) type 2without long term current use of insulin    Outreach from patient Tiffany Le was scheduled for a 01/08/21 follow up but [facility] RN CM received an incoming outreach from Tiffany Tiffany Le She discusses with [facility] RN CM that she is still pending her 01/31/21 new patient visit to [facility] and has run out of a few of her medication refills   Since her 12/28/20  hospital discharge, she has followed up with Dr Orvan Seen on 01/03/21 She has attempted to get medication refill assistance from the [place] patient care center without success today  She generally is able to afford to fill and purchase medications from [facility]. This pharmacy is closer to her home and most economical for her as she is a self pay patient  She reports with congestive Heart Failure (CHF) assessment that she has lower extremity edema, heavy legs and a weight gain from 230 lbs to 244 lbs. She has been without her Torsemide for "five days' She is scheduled to follow up with Dr Orvan Seen today 01/08/21 1615-30 and is requesting assistance with refills on her medications  [facility] RN CM interventions Completed a conference call outreach with Tiffany Le to Dr Rodena Goldmann office and left a message for RN, Caryl Pina, spoke with the receptionist about the message left   Completed an outreach to [facility] ([place] [facility]) Spoke with staff and transferred to [place] with Tiffany Le to confirm patient needs a new prescription for Torsemide and other medications prior to her being able to receive further assistance today   Caryl Pina, RN from Dr Orvan Seen returned a call and states she will ask Dr Orvan Seen to assist patient today with Torsemide refill  Caryl Pina also discusses patient will be able to get further assistance from the [facility] for future refills [phone]. Pt needs to rescheduled her missed appointment for [facility] that was missed when she was hospitalized.  Tiffany Shanks, RN for her assistance with a prescription for Torsemide today  Called pt to update  her and completed a conference call with her to [facility] [phone] to re-schedule her appointment  Spoke with Tiffany Le at advance heart failure clinic to obtain a January 24 2021 1200 appointment so that patient can continue to receive care and CHF medication refills as she awaits her  new patient primary care provider (PCP) appointment on January 31 2021  Education Encouraged patient attendance to scheduled appointments vs cancellations Discussed limiting sodium and fluid restriction at home Discussed pathophysiology of CHF, CHF action plan Pt confirms she will attend her 01/08/21 appointment today and the upcoming appointments  Pt voiced understanding and appreciation of services rendered   Social Works at a call center, 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  Lives with 59 year old daughter, 1 cat Husband is living in [place] (citizen there), trying to come to [place] (has been delayed)  Sent EMMI education on heart failure:full program, cardiac rehab information and stroke preventing secondary stroke via listed e-mail in demographics   Past Medical History:  Diagnosis Date  . Coronary artery disease   . CVA (cerebral vascular accident) ([place]) 09/2020  . Diabetes mellitus without complication ([place])   . Diabetic neuropathy ([place]) 02/2020  . 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  . Hyperlipidemia age 75  . Hypertension age 63  . Hypocalcemia 11/2019  . Hypokalemia 11/2019  . Iron deficiency anemia   . NSTEMI (non-ST elevated myocardial infarction) ([place]) 09/2020  . Obesity   . Proteinuria 12/09/2019  . Pulmonary embolism ([place]) 09/2020    Plan: Patient agrees to care plan [facility] RN CM scheduled this patient for another call attempt within 7-10 business days Pt encouraged to return a call to [facility] RN CM prn Goals Addressed              This Visit's Progress     Patient Stated   .  [facility]) Decrease inpatient admissions/ readmissions (pt-stated)   On track     Timeframe:  Short Range Goal Priority:  High Start Date:                11/26/20             Expected End Date:  01/08/21                     Follow Up Date 01/25/21  Work with pcp and cardiac HF clinic center staff on barriers to home care, medications and for admission prevention   Notes 01/08/21 pt reached out prior to  her scheduled appointment to receive assist with refilling of needed CHF medicines to prevent re admission as increase in weight, without torsemide x 5 days 12/28/20 discharging home from hospital at time of outreach, awaiting assist from daughter to get her medications filled. Agreed on scheduling of another outreach appointment 12/21/20 remains hospitalized s/p sternal wound debridement with skin substitution and abra on 12/20/20  12/17/20 Re admission noted on 12/14/20 for CHF. Collaboration with pcp SW Manuela Schwartz 12/11/20 unsuccessful EMMI stroke follow up attempt 11/26/20 successful outreach denied depression s/s related to CVA. Confirmed related to filling medications but resolved after pcp visit She denied need of [facility] SW and other resources. Her depression screen on 11/26/20 PHQ2= 1     .  [facility]) Manage My Medicine (pt-stated)   On track     Timeframe:  Short-Term Goal Priority:  High Start Date:               01/08/21  Expected End Date:     01/25/21                  Follow Up Date 01/25/21   - call for medicine refill 2 or 3 days before it runs out - keep a list of all the medicines I take; vitamins and herbals too      Notes:  01/08/21 Pt called [facility] RN CM for assist to get refill of torsemide +, to follow up with cardiac surgeon 01/08/21 and go to cardiac rehab/HF clinic rescheduled appointment 01/24/21 to continue to receive care and medicine refills from HF clinic    .  [facility]) Track and Manage Fluids and Swelling-Heart Failure (pt-stated)   On track     Timeframe:  Short-Term Goal Priority:  High Start Date:                        01/08/21     Expected End Date:         01/25/21              Follow Up Date 01/25/21   - use salt in moderation - watch for swelling in feet, ankles and legs every day - weigh myself daily      Notes:  01/08/21 weighing Noted wt gain and out of torsemide, called for assist to get refill of torsemide, to follow up with cardiac surgeon and go to cardiac  rehab rescheduled appointment voice understanding of CHF action plan         Michol Emory L. [name], RN, BSN, [place] Coordinator Office number [phone] Mobile number [phone]  Main [facility] number [phone] Fax number [phone]

## 2021-01-09 ENCOUNTER — Other Ambulatory Visit: Payer: Self-pay

## 2021-01-09 ENCOUNTER — Ambulatory Visit: Payer: Self-pay | Admitting: *Deleted

## 2021-01-09 NOTE — Telephone Encounter (Signed)
Medication have been refilled.  

## 2021-01-10 ENCOUNTER — Ambulatory Visit: Payer: Self-pay | Admitting: Cardiothoracic Surgery

## 2021-01-11 ENCOUNTER — Other Ambulatory Visit: Payer: Self-pay

## 2021-01-14 ENCOUNTER — Other Ambulatory Visit: Payer: Self-pay

## 2021-01-14 LAB — FUNGUS CULTURE WITH STAIN

## 2021-01-14 LAB — FUNGAL ORGANISM REFLEX

## 2021-01-14 LAB — FUNGUS CULTURE RESULT

## 2021-01-15 ENCOUNTER — Other Ambulatory Visit: Payer: Self-pay

## 2021-01-15 ENCOUNTER — Other Ambulatory Visit: Payer: Self-pay | Admitting: Nurse Practitioner

## 2021-01-15 DIAGNOSIS — R7303 Prediabetes: Secondary | ICD-10-CM

## 2021-01-15 DIAGNOSIS — E1169 Type 2 diabetes mellitus with other specified complication: Secondary | ICD-10-CM

## 2021-01-15 DIAGNOSIS — E119 Type 2 diabetes mellitus without complications: Secondary | ICD-10-CM

## 2021-01-15 DIAGNOSIS — R739 Hyperglycemia, unspecified: Secondary | ICD-10-CM

## 2021-01-17 ENCOUNTER — Other Ambulatory Visit: Payer: Self-pay

## 2021-01-17 ENCOUNTER — Other Ambulatory Visit: Payer: Self-pay | Admitting: *Deleted

## 2021-01-17 ENCOUNTER — Ambulatory Visit
Admission: RE | Admit: 2021-01-17 | Discharge: 2021-01-17 | Disposition: A | Discharge status: Home / Self Care | Payer: Self-pay | Source: Ambulatory Visit | Attending: Cardiothoracic Surgery | Admitting: Cardiothoracic Surgery

## 2021-01-17 ENCOUNTER — Ambulatory Visit: Payer: Self-pay | Admitting: Cardiothoracic Surgery

## 2021-01-17 ENCOUNTER — Encounter: Payer: Self-pay | Admitting: Cardiothoracic Surgery

## 2021-01-17 VITALS — BP 126/81 | HR 98 | Resp 20

## 2021-01-17 DIAGNOSIS — J90 Pleural effusion, not elsewhere classified: Secondary | ICD-10-CM

## 2021-01-17 DIAGNOSIS — Z951 Presence of aortocoronary bypass graft: Secondary | ICD-10-CM

## 2021-01-17 DIAGNOSIS — Z5189 Encounter for other specified aftercare: Secondary | ICD-10-CM

## 2021-01-18 NOTE — Telephone Encounter (Signed)
Left voicemail for callback.

## 2021-01-21 ENCOUNTER — Other Ambulatory Visit: Payer: Self-pay

## 2021-01-21 MED ORDER — INSULIN PEN NEEDLE 31G X 5 MM MISC
3 refills | Status: DC
  Filled 2021-01-21 – 2021-10-11 (×3): qty 100, 25d supply, fill #0

## 2021-01-24 ENCOUNTER — Encounter (HOSPITAL_COMMUNITY): Payer: Self-pay

## 2021-01-25 ENCOUNTER — Other Ambulatory Visit: Payer: Self-pay | Admitting: *Deleted

## 2021-01-25 NOTE — Patient Outreach (Addendum)
[facility]) Care Management  01/25/2021  Tiffany Le November 30, 1975 [id number]   [facility] outreach for follow up  Tiffany Le was referred to [facility] on 11/07/20 for EMMI stroke For RED ON [place] Day # 9       Date: 11/06/20 1000 Tuesday Red Alert Reason: Sad, hopeless, anxious, or empty? Yes Issued smoking cessation, thinking about quitting smoking and stroke preventing secondary stroke EMMIs  Insurance: self pay, Medicaid pending   On 11/26/20 the EMMI alert was addressed and reported resolved Last outreach on 01/08/21 in which she was assisted with medication management or Torsemide and to reschedule her congestive Heart Failure (CHF) clinic appointment With review of EPIC today, patient noted to cancel this re scheduled appointment, appears changed to 01/28/21  [facility] Unsuccessful outreach   Outreach attempt to the home number  No answer. [facility] RN CM left HIPAA [facility] Portability and Accountability Act) compliant voicemail message along with CM's contact info.   Plan: [facility] RN CM scheduled this patient for another call attempt within 4-7 business days  Lacey Dotson L. [name], RN, BSN, [place] Coordinator Office number [phone] Mobile number [phone]  Main [facility] number [phone] Fax number [phone]

## 2021-01-28 ENCOUNTER — Other Ambulatory Visit: Payer: Self-pay

## 2021-01-28 ENCOUNTER — Ambulatory Visit: Payer: Medicaid Other | Attending: Cardiothoracic Surgery | Admitting: Physical Therapy

## 2021-01-29 ENCOUNTER — Other Ambulatory Visit: Payer: Self-pay

## 2021-01-30 ENCOUNTER — Other Ambulatory Visit: Payer: Self-pay

## 2021-01-31 ENCOUNTER — Ambulatory Visit: Payer: Self-pay | Admitting: Cardiothoracic Surgery

## 2021-01-31 ENCOUNTER — Other Ambulatory Visit: Payer: Self-pay

## 2021-01-31 ENCOUNTER — Encounter: Payer: Self-pay | Admitting: Cardiothoracic Surgery

## 2021-01-31 ENCOUNTER — Ambulatory Visit: Payer: Self-pay | Admitting: Nurse Practitioner

## 2021-01-31 VITALS — BP 148/89 | HR 116 | Resp 20 | Ht 64.0 in

## 2021-01-31 DIAGNOSIS — Z5189 Encounter for other specified aftercare: Secondary | ICD-10-CM

## 2021-01-31 NOTE — Progress Notes (Signed)
The patient presents for wound check.  She is status post repair of a superficial sternal infection.  She has been treating the wound himself at home over the last couple weeks and has been doing well.  Physical exam: Well-appearing no acute distress Completely epithelialized sternal incision  Impression/plan: Follow-up as needed She is encouraged to keep her physical therapy appointment for 02/12/2021  Damarius Karnes Z. Orvan Seen, [place]

## 2021-02-01 NOTE — Progress Notes (Signed)
Patient is [name] for wound check.  She is status post repair of a superficial sternal infection.  This is largely been treated but there is a small defect in the right lower aspect of the incision that is coincident with a retention suture site.  She has been treating this at home on her home.  Physical exam: BP 126/81 (BP Location: Right Arm, Patient Position: Sitting, Cuff Size: Large)   Pulse 98   Resp 20   LMP 12/19/2020   SpO2 100% Comment: RA Well-appearing no acute distress Sternum is largely intact with a small defect in the right inferior aspect.  There is no purulence.  Impression/plan: Continue to treat as she is doing at home. Follow-up in 2 weeks.  Tiffany [name], [place]

## 2021-02-05 ENCOUNTER — Other Ambulatory Visit: Payer: Self-pay

## 2021-02-05 ENCOUNTER — Other Ambulatory Visit: Payer: Self-pay | Admitting: *Deleted

## 2021-02-05 NOTE — Patient Outreach (Addendum)
[facility]) Care Management  02/05/2021  Tiffany Le 13-Apr-1976 [id number]  [facility] outreach to EMMI stroke referred patient [name] was referred to [facility] on 11/07/20 for EMMI stroke For RED ON EMMI ALERT Day # 9       Date: 11/06/20 1000 Tuesday Red Alert Reason: Sad, hopeless, anxious, or empty? Yes Issued smoking cessation, thinking about quitting smoking and stroke preventing secondary stroke EMMIs   Insurance: self pay, Medicaid pending    On 11/26/20 the EMMI alert was addressed and reported resolved Last outreach on 01/08/21 in which she was assisted with medication management or Torsemide and to reschedule her congestive Heart Failure (CHF) clinic appointment With review of EPIC today, patient noted to cancel this re scheduled appointment, appears changed to 01/28/21  Pcp follow up per pt ws rescheduled an is now a telephonic outreach  EPIC indicates 02/07/21  Wound status post repair of a superficial sternal infection (Dr Tiffany Le) is reported to be completely close  New voiced concern since last outreach  Difficulty in lifting Left arm and leg and was referred to neuro outpatient PT  [place] 2 weeks ago - around 0530 stood up to go to restroom and just fell  Denies dizziness or buckling of knee or ankle but had pain with left arm  The left arm was used to obtain a harvest vein She was on oxycodone but has since decreased the use of oxycodone and has started the use of tramadol. She reports being cautious with the use of tramadol as she reports the it makes her drowsy if used during the day  She denies speech issues but has noted visual issues She reports eye pain when in the sun which causes her to have to use sun shades She is in need of an eye exam and new glasses but remains uninsured, pending medicaid  DME glasses  Medicaid/uninsured medical resources [facility] RN Le encouraged her to outreach to the Tiffany Le medicaid program to find out the status of her  June 7121 application appeal Provided medicaid appeal # [phone] and Tiffany Le, Tiffany Le, Tiffany Le appeals staff number [id number] She completes an application for orange card but is pending update  Discussed the Tiffany Le for assist with resources  Provided the Tiffany Le health billing number [phone] for her to inquire about the hardship settlement & other billing programs   Medications  Need more torsemide  She plans on outreaching to her pcp's pharmacy for assistance and will follow up with [facility] RN [facility] if unsuccessful  Plans Patient agrees to care plan and follow up within the next 14-21 days Pt encouraged to return a call to Tiffany Le prn Sent note to pcp, and pcp SW  In basket message to PCP Tiffany Le. Tiffany Hamman, RN, BSN, Tiffany Le Coordinator Office number [phone] [facility] number [phone] Fax number [phone]

## 2021-02-07 ENCOUNTER — Other Ambulatory Visit: Payer: Self-pay

## 2021-02-07 ENCOUNTER — Encounter: Payer: Self-pay | Admitting: Nurse Practitioner

## 2021-02-07 ENCOUNTER — Telehealth (INDEPENDENT_AMBULATORY_CARE_PROVIDER_SITE_OTHER): Payer: Medicaid Other | Admitting: Nurse Practitioner

## 2021-02-07 DIAGNOSIS — R0602 Shortness of breath: Secondary | ICD-10-CM

## 2021-02-07 DIAGNOSIS — E1169 Type 2 diabetes mellitus with other specified complication: Secondary | ICD-10-CM

## 2021-02-07 DIAGNOSIS — I25110 Atherosclerotic heart disease of native coronary artery with unstable angina pectoris: Secondary | ICD-10-CM | POA: Diagnosis not present

## 2021-02-07 DIAGNOSIS — I10 Essential (primary) hypertension: Secondary | ICD-10-CM | POA: Diagnosis not present

## 2021-02-07 MED ORDER — TRAMADOL HCL 50 MG PO TABS: 50.0000 mg | ORAL_TABLET | Freq: Four times a day (QID) | ORAL | 0 refills | Status: AC | PRN

## 2021-02-07 MED ORDER — ALBUTEROL SULFATE HFA 108 (90 BASE) MCG/ACT IN AERS
2.0000 | INHALATION_SPRAY | Freq: Four times a day (QID) | RESPIRATORY_TRACT | 12 refills | Status: DC | PRN
  Filled 2021-02-07: qty 18, 25d supply, fill #0

## 2021-02-07 MED ORDER — ISOSORB DINITRATE-HYDRALAZINE 20-37.5 MG PO TABS
0.5000 | ORAL_TABLET | Freq: Three times a day (TID) | ORAL | 3 refills | Status: DC
  Filled 2021-02-07: qty 45, 30d supply, fill #0

## 2021-02-07 MED ORDER — ISOSORB DINITRATE-HYDRALAZINE 20-37.5 MG PO TABS: 0.5000 | ORAL_TABLET | Freq: Three times a day (TID) | ORAL | 3 refills | Status: DC

## 2021-02-07 MED ORDER — ALBUTEROL SULFATE HFA 108 (90 BASE) MCG/ACT IN AERS: 2.0000 | INHALATION_SPRAY | Freq: Four times a day (QID) | RESPIRATORY_TRACT | 12 refills | Status: DC | PRN

## 2021-02-07 NOTE — Patient Instructions (Signed)
Managing Your Hypertension Hypertension, also called high blood pressure, is when the force of the blood pressing against the walls of the arteries is too strong. Arteries are blood vessels that carry blood from your heart throughout your body. Hypertension forces the heart to work harder to pump blood and may cause the arteries tobecome narrow or stiff. Understanding blood pressure readings Your personal target blood pressure may vary depending on your medical conditions, your age, and other factors. A blood pressure reading includes a higher number over a lower number. Ideally, your blood pressure should be below 120/80. You should know that: The first, or top, number is called the systolic pressure. It is a measure of the pressure in your arteries as your heart beats. The second, or bottom number, is called the diastolic pressure. It is a measure of the pressure in your arteries as the heart relaxes. Blood pressure is classified into four stages. Based on your blood pressure reading, your health care provider may use the following stages to determine what type of treatment you need, if any. Systolic pressure and diastolicpressure are measured in a unit called mmHg. Normal Systolic pressure: below 120. Diastolic pressure: below 80. Elevated Systolic pressure: 120-129. Diastolic pressure: below 80. Hypertension stage 1 Systolic pressure: 130-139. Diastolic pressure: 80-89. Hypertension stage 2 Systolic pressure: 140 or above. Diastolic pressure: 90 or above. How can this condition affect me? Managing your hypertension is an important responsibility. Over time, hypertension can damage the arteries and decrease blood flow to important parts of the body, including the brain, heart, and kidneys. Having untreated or uncontrolled hypertension can lead to: A heart attack. A stroke. A weakened blood vessel (aneurysm). Heart failure. Kidney damage. Eye damage. Metabolic syndrome. Memory and  concentration problems. Vascular dementia. What actions can I take to manage this condition? Hypertension can be managed by making lifestyle changes and possibly by taking medicines. Your health care provider will help you make a plan to bring yourblood pressure within a normal range. Nutrition  Eat a diet that is high in fiber and potassium, and low in salt (sodium), added sugar, and fat. An example eating plan is called the Dietary Approaches to Stop Hypertension (DASH) diet. To eat this way: Eat plenty of fresh fruits and vegetables. Try to fill one-half of your plate at each meal with fruits and vegetables. Eat whole grains, such as whole-wheat pasta, brown rice, or whole-grain bread. Fill about one-fourth of your plate with whole grains. Eat low-fat dairy products. Avoid fatty cuts of meat, processed or cured meats, and poultry with skin. Fill about one-fourth of your plate with lean proteins such as fish, chicken without skin, beans, eggs, and tofu. Avoid pre-made and processed foods. These tend to be higher in sodium, added sugar, and fat. Reduce your daily sodium intake. Most people with hypertension should eat less than 1,500 mg of sodium a day.  Lifestyle  Work with your health care provider to maintain a healthy body weight or to lose weight. Ask what an ideal weight is for you. Get at least 30 minutes of exercise that causes your heart to beat faster (aerobic exercise) most days of the week. Activities may include walking, swimming, or biking. Include exercise to strengthen your muscles (resistance exercise), such as weight lifting, as part of your weekly exercise routine. Try to do these types of exercises for 30 minutes at least 3 days a week. Do not use any products that contain nicotine or tobacco, such as cigarettes, e-cigarettes, and chewing   tobacco. If you need help quitting, ask your health care provider. Control any long-term (chronic) conditions you have, such as high  cholesterol or diabetes. Identify your sources of stress and find ways to manage stress. This may include meditation, deep breathing, or making time for fun activities.  Alcohol use Do not drink alcohol if: Your health care provider tells you not to drink. You are pregnant, may be pregnant, or are planning to become pregnant. If you drink alcohol: Limit how much you use to: 0-1 drink a day for women. 0-2 drinks a day for men. Be aware of how much alcohol is in your drink. In the [place]., one drink equals one 12 oz bottle of beer (355 mL), one 5 oz glass of wine (148 mL), or one 1 oz glass of hard liquor (44 mL). Medicines Your health care provider may prescribe medicine if lifestyle changes are not enough to get your blood pressure under control and if: Your systolic blood pressure is 130 or higher. Your diastolic blood pressure is 80 or higher. Take medicines only as told by your health care provider. Follow the directions carefully. Blood pressure medicines must be taken as told by your health care provider. The medicine does not work as well when you skip doses. Skippingdoses also puts you at risk for problems. Monitoring Before you monitor your blood pressure: Do not smoke, drink caffeinated beverages, or exercise within 30 minutes before taking a measurement. Use the bathroom and empty your bladder (urinate). Sit quietly for at least 5 minutes before taking measurements. Monitor your blood pressure at home as told by your health care provider. To do this: Sit with your back straight and supported. Place your feet flat on the floor. Do not cross your legs. Support your arm on a flat surface, such as a table. Make sure your upper arm is at heart level. Each time you measure, take two or three readings one minute apart and record the results. You may also need to have your blood pressure checked regularly by your healthcare provider. General information Talk with your health care  provider about your diet, exercise habits, and other lifestyle factors that may be contributing to hypertension. Review all the medicines you take with your health care provider because there may be side effects or interactions. Keep all visits as told by your health care provider. Your health care provider can help you create and adjust your plan for managing your high blood pressure. Where to find more information National Heart, Lung, and Blood Institute: www.nhlbi.nih.gov American Heart Association: www.heart.org Contact a health care provider if: You think you are having a reaction to medicines you have taken. You have repeated (recurrent) headaches. You feel dizzy. You have swelling in your ankles. You have trouble with your vision. Get help right away if: You develop a severe headache or confusion. You have unusual weakness or numbness, or you feel faint. You have severe pain in your chest or abdomen. You vomit repeatedly. You have trouble breathing. These symptoms may represent a serious problem that is an emergency. Do not wait to see if the symptoms will go away. Get medical help right away. Call your local emergency services (911 in the U.S.). Do not drive yourself to the hospital. Summary Hypertension is when the force of blood pumping through your arteries is too strong. If this condition is not controlled, it may put you at risk for serious complications. Your personal target blood pressure may vary depending on your medical conditions,   your age, and other factors. For most people, a normal blood pressure is less than 120/80. Hypertension is managed by lifestyle changes, medicines, or both. Lifestyle changes to help manage hypertension include losing weight, eating a healthy, low-sodium diet, exercising more, stopping smoking, and limiting alcohol. This information is not intended to replace advice given to you by your health care provider. Make sure you discuss any questions  you have with your healthcare provider. Document Revised: 09/02/2019 Document Reviewed: 06/28/2019 Elsevier Patient Education  2022 [place].  Diabetes Mellitus and Nutrition, Adult When you have diabetes, or diabetes mellitus, it is very important to have healthy eating habits because your blood sugar (glucose) levels are greatly affected by what you eat and drink. Eating healthy foods in the right amounts, at about the same times every day, can help you: Control your blood glucose. Lower your risk of heart disease. Improve your blood pressure. Reach or maintain a healthy weight. What can affect my meal plan? Every person with diabetes is different, and each person has different needs for a meal plan. Your health care provider may recommend that you work with a dietitian to make a meal plan that is best for you. Your meal plan may vary depending on factors such as: The calories you need. The medicines you take. Your weight. Your blood glucose, blood pressure, and cholesterol levels. Your activity level. Other health conditions you have, such as heart or kidney disease. How do carbohydrates affect me? Carbohydrates, also called carbs, affect your blood glucose level more than any other type of food. Eating carbs naturally raises the amount of glucose in your blood. Carb counting is a method for keeping track of how many carbs you eat. Counting carbs is important to keep your blood glucose at a healthy level,especially if you use insulin or take certain oral diabetes medicines. It is important to know how many carbs you can safely have in each meal. This is different for every person. Your dietitian can help you calculate how manycarbs you should have at each meal and for each snack. How does alcohol affect me? Alcohol can cause a sudden decrease in blood glucose (hypoglycemia), especially if you use insulin or take certain oral diabetes medicines. Hypoglycemia can be a life-threatening  condition. Symptoms of hypoglycemia, such as sleepiness, dizziness, and confusion, are similar to symptoms of having too much alcohol. Do not drink alcohol if: Your health care provider tells you not to drink. You are pregnant, may be pregnant, or are planning to become pregnant. If you drink alcohol: Do not drink on an empty stomach. Limit how much you use to: 0-1 drink a day for women. 0-2 drinks a day for men. Be aware of how much alcohol is in your drink. In the [place]., one drink equals one 12 oz bottle of beer (355 mL), one 5 oz glass of wine (148 mL), or one 1 oz glass of hard liquor (44 mL). Keep yourself hydrated with water, diet soda, or unsweetened iced tea. Keep in mind that regular soda, juice, and other mixers may contain a lot of sugar and must be counted as carbs. What are tips for following this plan?  Reading food labels Start by checking the serving size on the "Nutrition Facts" label of packaged foods and drinks. The amount of calories, carbs, fats, and other nutrients listed on the label is based on one serving of the item. Many items contain more than one serving per package. Check the total grams (g) of  carbs in one serving. You can calculate the number of servings of carbs in one serving by dividing the total carbs by 15. For example, if a food has 30 g of total carbs per serving, it would be equal to 2 servings of carbs. Check the number of grams (g) of saturated fats and trans fats in one serving. Choose foods that have a low amount or none of these fats. Check the number of milligrams (mg) of salt (sodium) in one serving. Most people should limit total sodium intake to less than 2,300 mg per day. Always check the nutrition information of foods labeled as "low-fat" or "nonfat." These foods may be higher in added sugar or refined carbs and should be avoided. Talk to your dietitian to identify your daily goals for nutrients listed on the label. Shopping Avoid buying  canned, pre-made, or processed foods. These foods tend to be high in fat, sodium, and added sugar. Shop around the outside edge of the grocery store. This is where you will most often find fresh fruits and vegetables, bulk grains, fresh meats, and fresh dairy. Cooking Use low-heat cooking methods, such as baking, instead of high-heat cooking methods like deep frying. Cook using healthy oils, such as olive, canola, or sunflower oil. Avoid cooking with butter, cream, or high-fat meats. Meal planning Eat meals and snacks regularly, preferably at the same times every day. Avoid going long periods of time without eating. Eat foods that are high in fiber, such as fresh fruits, vegetables, beans, and whole grains. Talk with your dietitian about how many servings of carbs you can eat at each meal. Eat 4-6 oz (112-168 g) of lean protein each day, such as lean meat, chicken, fish, eggs, or tofu. One ounce (oz) of lean protein is equal to: 1 oz (28 g) of meat, chicken, or fish. 1 egg.  cup (62 g) of tofu. Eat some foods each day that contain healthy fats, such as avocado, nuts, seeds, and fish. What foods should I eat? Fruits Berries. Apples. Oranges. Peaches. Apricots. Plums. Grapes. Mango. Papaya.Pomegranate. Kiwi. Cherries. Vegetables Lettuce. Spinach. Leafy greens, including kale, chard, collard greens, and mustard greens. Beets. Cauliflower. Cabbage. Broccoli. Carrots. Green beans.Tomatoes. Peppers. Onions. Cucumbers. Brussels sprouts. Grains Whole grains, such as whole-wheat or whole-grain bread, crackers, tortillas,cereal, and pasta. Unsweetened oatmeal. Quinoa. Brown or wild rice. Meats and other proteins Seafood. Poultry without skin. Lean cuts of poultry and beef. Tofu. Nuts. Seeds. Dairy Low-fat or fat-free dairy products such as milk, yogurt, and cheese. The items listed above may not be a complete list of foods and beverages you can eat. Contact a dietitian for more information. What  foods should I avoid? Fruits Fruits canned with syrup. Vegetables Canned vegetables. Frozen vegetables with butter or cream sauce. Grains Refined white flour and flour products such as bread, pasta, snack foods, andcereals. Avoid all processed foods. Meats and other proteins Fatty cuts of meat. Poultry with skin. Breaded or fried meats. Processed meat.Avoid saturated fats. Dairy Full-fat yogurt, cheese, or milk. Beverages Sweetened drinks, such as soda or iced tea. The items listed above may not be a complete list of foods and beverages you should avoid. Contact a dietitian for more information. Questions to ask a health care provider Do I need to meet with a diabetes educator? Do I need to meet with a dietitian? What number can I call if I have questions? When are the best times to check my blood glucose? Where to find more information: American Diabetes Association:  diabetes.org Academy of Nutrition and Dietetics: www.eatright.[employer] of Diabetes and Digestive and Kidney Diseases: [place] Association of Diabetes Care and Education Specialists: www.diabeteseducator.org Summary It is important to have healthy eating habits because your blood sugar (glucose) levels are greatly affected by what you eat and drink. A healthy meal plan will help you control your blood glucose and maintain a healthy lifestyle. Your health care provider may recommend that you work with a dietitian to make a meal plan that is best for you. Keep in mind that carbohydrates (carbs) and alcohol have immediate effects on your blood glucose levels. It is important to count carbs and to use alcohol carefully. This information is not intended to replace advice given to you by your health care provider. Make sure you discuss any questions you have with your healthcare provider. Document Revised: 07/05/2019 Document Reviewed: 07/05/2019 Elsevier Patient Education  2021 [employer].

## 2021-02-07 NOTE — Progress Notes (Signed)
[place], [place]  [address] Phone:  [phone]   Fax:  [phone] Virtual Visit via Video Note  I connected with Tiffany Le on 02/08/21 at  3:00 PM EDT by video and verified that I am speaking with the correct person using two identifiers.   I discussed the limitations, risks, security and privacy concerns of performing an evaluation and management service by video and the availability of in person appointments. I also discussed with the patient that there may be a patient responsible charge related to this service. The patient expressed understanding and agreed to proceed.  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 A former patient of NP [place].   She reports been hospitalized on for 12/14/20 for sternal wound dehsicment . The incision is currently completely closed. 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 She has some swelling in her legs with numbness in both her feet.  She reports that this is ongoing.   She sufferd a fall about 0530 about 2 weeks ago. She will start physical therapy on 02/12/21. She reports that the arm feels heavy and painful.   Hypertension Patient is here for follow-up of elevated blood pressure. She is not exercising and is adherent to a low-salt diet. Blood pressure is not monitoring at home. Cardiac symptoms: lower extremity edema. Patient denies chest pain, dyspnea, and irregular heart beat. Cardiovascular risk factors:  diabetes mellitus, dyslipidemia, hypertension, obesity (BMI >= 30 kg/m2), and sedentary lifestyle. Use of agents associated with hypertension: none. History of target organ damage: angina/ prior myocardial infarction ans stroke  She have not taken the atorvastatin.  She reports that the pill is too large.  Diabetes Mellitus Patient presents for follow up of diabetes. Patient denies hypoglycemia . Evaluation to date has included: hemoglobin A1C.   Current treatment: Continued insulin which has been effective, Continued sulfonylurea which has been effective, Continued statin which has been unable to assess effectiveness, Continued ACE inhibitor/ARB which has been unable to assess effectiveness, and Continued Dewaine Oats which has been effective. Occasional eating jolly ranchers   Observations/Objective:  Virtual visit no exam patient in no acute distress Assessment and Plan: Assessment  Primary Diagnosis & Pertinent Problem List: The primary encounter diagnosis was Type 2 diabetes mellitus with other specified complication, without long-term current use of insulin ([place]). 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  Visit Diagnosis: 1. Type 2 diabetes mellitus with other specified complication, without long-term current use of insulin ([facility])  Controlled Encourage compliance with current treatment regimen  Encourage regular CBG monitoring Encourage contacting office if excessive hyperglycemia and or hypoglycemia Lifestyle modification with healthy diet (fewer calories, more high fiber foods, whole grains and non-starchy vegetables, lower fat meat and fish, low-fat diary include healthy oils) regular exercise (physical activity) and weight loss  2. Shortness of breath  Persistent inhalers refilled  3. Coronary artery disease involving  native coronary artery of native heart with unstable angina pectoris ([place])  Continue to  follow-up with cardiology continue with current regimen Discussed the importance of taking the atorvastatin daily as directed.  Patient to see if pharmacy i has additional option due to size to ensure compliance  4. Essential hypertension, benign  Persistent continue to follow-up with cardiology and continue with current     Follow Up Instructions: Follow-up as scheduled   I discussed the assessment and treatment plan with the patient. The patient was provided an opportunity to ask questions and all were answered. The patient agreed with the plan and demonstrated an understanding of the instructions.   The patient was advised to call back or seek an in-person evaluation if the symptoms worsen or if the condition fails to improve as anticipated.  I provided 18 minutes of video- visit time during this encounter.   Vevelyn Francois, NP

## 2021-02-08 ENCOUNTER — Telehealth: Payer: Self-pay | Admitting: Clinical

## 2021-02-08 NOTE — Telephone Encounter (Signed)
[facility]ated [facility]al [facility] Case Management Referral Note  02/08/2021 Name: Tiffany Le MRN: [id number] DOB: 11-19-1975 Tiffany Le is a 45 y.o. year old female who sees Vevelyn Francois, NP for primary care. LCSW was consulted to assess patient's needs and assist the patient with  financial resources .  Interpreter: No.   Interpreter Name & Language: none  Assessment: Patient experiencing Financial constraints related to lack of health coverage . Patient was referred by [facility].  Intervention: CSW called patient to follow up on referral from [facility], who indicated patient was in process of appealing Medicaid decision. Patient reported she had a hearing regarding her Medicaid application and is awaiting the outcome. She reports she was denied social security disability earlier this year. Advised patient to follow up with CSW once she receives the determination and advised that CSW can assist in either following up for a new appeal or application, or can assist with [employer] and [employer] applications. Provided CSW contact information.   Review of patient status, including review of consultants reports, relevant laboratory and other test results, and collaboration with appropriate care team members and the patient's provider was performed as part of comprehensive patient evaluation and provision of services.    Estanislado Emms, [facility] [phone]

## 2021-02-12 ENCOUNTER — Other Ambulatory Visit: Payer: Self-pay

## 2021-02-12 ENCOUNTER — Ambulatory Visit: Payer: Medicaid Other | Attending: Cardiothoracic Surgery | Admitting: Physical Therapy

## 2021-02-12 ENCOUNTER — Encounter: Payer: Self-pay | Admitting: Physical Therapy

## 2021-02-12 VITALS — HR 83

## 2021-02-12 DIAGNOSIS — M6281 Muscle weakness (generalized): Secondary | ICD-10-CM | POA: Diagnosis not present

## 2021-02-12 DIAGNOSIS — M542 Cervicalgia: Secondary | ICD-10-CM

## 2021-02-12 DIAGNOSIS — G8929 Other chronic pain: Secondary | ICD-10-CM

## 2021-02-12 DIAGNOSIS — M25612 Stiffness of left shoulder, not elsewhere classified: Secondary | ICD-10-CM | POA: Diagnosis present

## 2021-02-12 DIAGNOSIS — M25512 Pain in left shoulder: Secondary | ICD-10-CM | POA: Insufficient documentation

## 2021-02-12 NOTE — Therapy (Signed)
[place], [place], [address] Phone: [phone]   Fax:  [phone]  Physical Therapy Evaluation  Patient Details  Name: Tiffany Le MRN: [id number] Date of Birth: 1976-07-21 Referring Provider (PT): Fredrich Romans, MD   Encounter Date: 02/12/2021   PT End of Session - 02/12/21 1624     Visit Number 1    Number of Visits 17    Date for PT Re-Evaluation 04/09/21    Authorization Type Self pay, medicaid pending    PT Start Time 8588    PT Stop Time 1706    PT Time Calculation (min) 42 min    Activity Tolerance Patient tolerated treatment well;No increased pain    Behavior During Therapy WFL for tasks assessed/performed             Past Medical History:  Diagnosis Date   Coronary artery disease    CVA (cerebral vascular accident) ([place]) 09/2020   Diabetes mellitus without complication (HCC)    Diabetic neuropathy ([place]) 02/2020   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   Hyperlipidemia age 45   Hypertension age 45   Hypocalcemia 11/2019   Hypokalemia 11/2019   Iron deficiency anemia    NSTEMI (non-ST elevated myocardial infarction) ([place]) 09/2020   Obesity    Proteinuria 12/09/2019   Pulmonary embolism ([place]) 09/2020    Past Surgical History:  Procedure Laterality Date   APPLICATION OF WOUND VAC N/A 12/14/2020   Procedure: APPLICATION OF WOUND VAC;  Surgeon: Rexene Alberts, MD;  Location: [place];  Service: Thoracic;  Laterality: N/A;   APPLICATION OF WOUND VAC N/A 12/25/2020   Procedure: WOUND VAC CHANGE;  Surgeon: [name], MD;  Location: [place];  Service: Thoracic;  Laterality: N/A;   CARDIAC CATHETERIZATION  09/25/2020   CESAREAN SECTION  3/96   CLIPPING OF ATRIAL APPENDAGE N/A 10/09/2020   Procedure: CLIPPING OF ATRIAL APPENDAGE USING ATRICURE 35MM CLIP;  Surgeon: [name], MD;  Location: [place];  Service: Open Heart Surgery;  Laterality: N/A;   CORONARY ARTERY BYPASS GRAFT N/A 10/09/2020    Procedure: CORONARY ARTERY BYPASS GRAFTING (CABG), ON PUMP, TIMES FOUR, USING LEFT INTERNAL MAMMARY ARTERY AND LEFT RADIAL ARTERY (OPEN HARVEST);  Surgeon: [name], MD;  Location: [place];  Service: Open Heart Surgery;  Laterality: N/A;  POSSIBLE BIMA   INCISION AND DRAINAGE OF WOUND N/A 12/20/2020   Procedure: STERNAL WOUND DEBRIDEMENT WITH SKIN SUBSTITUTION AND ABRA;  Surgeon: Wallace Going, DO;  Location: [place];  Service: Plastics;  Laterality: N/A;   IR THORACENTESIS ASP PLEURAL SPACE W/IMG GUIDE  10/16/2020   LEFT HEART CATH AND CORONARY ANGIOGRAPHY N/A 09/25/2020   Procedure: LEFT HEART CATH AND CORONARY ANGIOGRAPHY;  Surgeon: Leonie Man, MD;  Location: [place] [facility];  Service: Cardiovascular;  Laterality: N/A;   ORIF FEMUR FRACTURE     RADIAL ARTERY HARVEST Left 10/09/2020   Procedure: RADIAL ARTERY HARVEST;  Surgeon: [name], MD;  Location: [place];  Service: Open Heart Surgery;  Laterality: Left;   STERNAL CLOSURE N/A 12/25/2020   Procedure: STERNAL CLOSURE;  Surgeon: [name], MD;  Location: [place] [facility];  Service: Thoracic;  Laterality: N/A;   STERNAL WOUND DEBRIDEMENT N/A 12/14/2020   Procedure: STERNAL WOUND DEBRIDEMENT;  Surgeon: Rexene Alberts, MD;  Location: [place];  Service: Thoracic;  Laterality: N/A;   STERNAL WOUND DEBRIDEMENT N/A 12/18/2020   Procedure: STERNAL WOUND DEBRIDEMENT, Wound Vac Change;  Surgeon: [name], MD;  Location: [place];  Service: Thoracic;  Laterality: N/A;   STERNAL WOUND DEBRIDEMENT N/A 12/25/2020   Procedure: STERNAL WOUND IRRIGATION AND DEBRIDEMENT;  Surgeon: [name], MD;  [facility];  Service: Thoracic;  Laterality: N/A;   TEE WITHOUT CARDIOVERSION N/A 10/09/2020   Procedure: TRANSESOPHAGEAL ECHOCARDIOGRAM (TEE);  Surgeon: [name], MD;  Location: [place];  Service: Open Heart Surgery;  Laterality: N/A;    Vitals:   02/12/21 1633  Pulse: 83  SpO2: 99%      Subjective Assessment - 02/12/21  1633     Subjective Patient reports "one morning I woke up", couldn't lift L arm, a couple of weeks later she tried to stand up and couldn't  hold herself up and fell back on her bed. Since then she has had ongoing L shoulder pain superior/posterior/lateral, in the clavical region with decreased sensation, not particularly painful at ant/post [facility] region.  Patient reprots she was not able to lift arm "at all" for a few weeks but has been working on it and now can lift arm "a little". Also states she needs to put pressure on the back of her neck to lay down.  Patient reports that she has had two CVAs in the occipital region that has left her vision "off" since the CABG.  Patient reports she sometimes gets SOB with activity. Patient states she has difficulty with some dressing tasks.    Limitations Lifting    Patient Stated Goals R shoulder needs to move to be more independent.    Currently in Pain? Yes    Pain Score 0-No pain   Max this week = 4/10   Pain Location Shoulder    Pain Orientation Left;Anterior   Clavical area as well as neck/shoulder   Pain Descriptors / Indicators Aching;Throbbing    Pain Type Chronic pain    Pain Radiating Towards to clavical    Pain Onset More than a month ago    Pain Frequency Intermittent   Increases with movement.   Pain Relieving Factors Medication    Effect of Pain on Daily Activities Can't lift/reach up.                [facility] PT Assessment - 02/12/21 0001       Assessment   Medical Diagnosis L shoulder pain    Referring Provider (PT) Fredrich Romans, MD    Onset Date/Surgical Date 01/09/21   approx   Hand Dominance Right    Next MD Visit NONE    Prior Therapy no      Precautions   Precautions None    Precaution Comments Released to work, but hasn't return yet.      Restrictions   Weight Bearing Restrictions No      Balance Screen   Has the patient fallen in the past 6 months Yes    How many times? 1    Has the patient had a decrease in  activity level because of a fear of falling?  Yes   Cautious   Is the patient reluctant to leave their home because of a fear of falling?  No      Home Environment   Living Environment Private residence    Living Arrangements Children    Type of [place] to enter    Entrance Stairs-Number of Steps 14    Entrance Stairs-Rails Can reach both    [place] One  level      Prior Function   Level of Independence Independent    Vocation Full time employment   Not working at this time   [employer] work      Observation/Other Assessments   Observations No apparent distress    Focus on Therapeutic Outcomes (FOTO)  N/A      Sensation   Light Touch Impaired by gross assessment   L UE with decreased sensation compared to R.     AROM   Right Shoulder Flexion 120 Degrees    Right Shoulder ABduction 135 Degrees    Right Shoulder Internal Rotation --   T8   Right Shoulder External Rotation 70 Degrees    Left Shoulder Flexion 60 Degrees    Left Shoulder ABduction 75 Degrees    Left Shoulder Internal Rotation --   sacrum   Left Shoulder External Rotation 60 Degrees    Cervical Flexion WNL    Cervical Extension WNL    Cervical - Right Side Bend WNL   Mild pain R cervical musculature   Cervical - Left Side Bend WNL    Cervical - Right Rotation WNL   Mild pain R cervical musculature   Cervical - Left Rotation WNL      PROM   Left Shoulder Flexion 95 Degrees    Left Shoulder ABduction 96 Degrees      Strength   Right Shoulder Flexion 5/5    Right Shoulder ABduction 5/5    Left Shoulder Flexion 3-/5    Left Shoulder ABduction 3-/5    Left Shoulder Internal Rotation 3-/5    Left Shoulder External Rotation 3+/5      Palpation   Palpation comment TTP B UTs, L levator, L periscap, L midscap, L clavical region, NOT at ant/post [facility].                        Objective measurements completed on examination: See above findings.        [facility] Adult PT Treatment/Exercise - 02/12/21 0001       Shoulder Exercises: Seated   Retraction 10 reps;Both    Retraction Limitations 5sec hold      Shoulder Exercises: Stretch   Table Stretch - Flexion 30 seconds;2 reps    Table Stretch - Abduction 30 seconds;2 reps    Table Stretch - ABduction Limitations SCAPTION    Table Stretch - External Rotation 30 seconds;2 reps      Manual Therapy   Manual Therapy Soft tissue mobilization    Soft tissue mobilization STM B UT/levator/periscap/upper thor paraspinals                    PT Education - 02/12/21 2100     Education Details Results of evalaution, POC, HEP    Person(s) Educated Patient    Methods Explanation;Demonstration;Verbal cues;Handout    Comprehension Verbalized understanding;Returned demonstration              PT Short Term Goals - 02/12/21 2124       PT SHORT TERM GOAL #1   Title Patient will be indenpendent with initial HEP for symptom management.    Baseline self perscribed movement as exercise    Time 3    Period Weeks    Status New    Target Date 03/05/21      PT SHORT TERM GOAL #2   Title Patient will be able to reach shoulder high shelf with no more  than 5/10 pain for light household tasks.    Baseline flex=60deg    Time 3    Period Weeks    Status New    Target Date 03/05/21               PT Long Term Goals - 02/12/21 2127       PT LONG TERM GOAL #1   Title Patient will be independent with final HEP and progression to continue with progress of patient movement and pain management.    Baseline self perscrived movement    Time 8    Period Weeks    Status New    Target Date 04/09/21      PT LONG TERM GOAL #2   Title Patient will be able to lift 5# object from table to shoulder high shelf 3 consecutive times with L UE with no more than 2/10 pain to domonstrate ability to perform improved work tasks in an office.    Baseline Not able to life any weight with L UE.     Time 8    Period Weeks    Status New    Target Date 04/09/21      PT LONG TERM GOAL #3   Title Patient will report performing household chores >46min with no more than 2/10pain.    Baseline Household chores difficulty and cause up to 9/10 pain.    Time 8    Status New    Target Date 04/09/21      PT LONG TERM GOAL #4   Title Patient will report being able to don bra behind back with no more than 2/10 pain.    Baseline Unable to don bra    Time 8    Period Weeks    Status New    Target Date 04/09/21                    Plan - 02/12/21 1624     Clinical Impression Statement Patient late for evaluation, limiting assessment time.  45 yo R handed female referred to [facility] by Cardiothoracic surgeon for L shoulder pain following s/p CABGx4 (10/10/2020) with prolonged lack of movement.  Patient reports an episode of sudden onset of lack of movement L UE, then an episode of lack of ability to maintain standing with fall onto bed.  No imaging for possible neurological involvement performed.  Patient presented to PT evaluation with significant L shoulder strength, decreased active and passive ROM in all planes, particularly elevation and IR.  Patient with L shoulder muscular tightness with cervical screen, no radicular symptoms, chest and UT/levator/periscap tenderness.  Patient will require further assessment of shoulder vs cervical involvement. Patient with keloid surgical incision scar on palmer side of L lower arm from vein graft. Patient works full time as a Economist and is quite motivated to get back to use of L UE.  Patient will benefit from skilled PT to address deficits, patient education and to maximize functional use of L UE.    Personal Factors and Comorbidities Comorbidity 3+    Comorbidities CAD s/p CABG, HTN, HLD, IDDM with B foot neuropathy, HLD, CVAx2, PE s/p chest tube.    Examination-Activity Limitations Carry;Lift    Examination-Participation Restrictions  Community Activity;Cleaning;Occupation;Laundry;Meal Prep;Other   Dressing   Stability/Clinical Decision Making Stable/Uncomplicated    Clinical Decision Making Low    Rehab Potential Good    PT Frequency 2x / week    PT Duration 8 weeks  PT Treatment/Interventions ADLs/Self Care Home Management;Aquatic Therapy;Cryotherapy;Moist Heat;Therapeutic activities;Therapeutic exercise;Neuromuscular re-education;Patient/family education;Manual techniques;Scar mobilization;Passive range of motion;Dry needling;Taping;Vasopneumatic Device    PT Next Visit Plan Assess affects of HEP progress as appropriate, manual treatment as appropriate, continue assessment of cervical involvement, L UE strength and range. Perform Oswestry shoulder outcome assessment.    PT Home Exercise Plan Access Code: [email or web address]    Consulted and Agree with Plan of Care Patient             Patient will benefit from skilled therapeutic intervention in order to improve the following deficits and impairments:  Decreased range of motion, Cardiopulmonary status limiting activity, Decreased endurance, Impaired UE functional use, Impaired perceived functional ability, Impaired vision/preception, Pain, Decreased scar mobility, Decreased strength  Visit Diagnosis: Muscle weakness (generalized) - Plan: PT plan of care cert/re-cert  Stiffness of left shoulder, not elsewhere classified - Plan: PT plan of care cert/re-cert  Chronic left shoulder pain - Plan: PT plan of care cert/re-cert  Cervicalgia - Plan: PT plan of care cert/re-cert     Problem List Patient Active Problem List   Diagnosis Date Noted   Visit for wound check 01/08/2021   Sternal wound dehiscence 12/14/2020   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 ([place])    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   Pulmonary edema 10/02/2020   Acute respiratory failure with hypoxia ([place])    Coronary artery disease involving native coronary artery of native  heart with unstable angina pectoris ([place])    Acute on chronic combined systolic (congestive) and diastolic (congestive) heart failure (HCC)    NSTEMI (non-ST elevated myocardial infarction) ([place]) 09/25/2020   Uncontrolled diabetes mellitus with complication, without long-term current use of insulin (HCC)    Shortness of breath 12/25/2019   Hemoglobin A1C greater than 9%, indicating poor diabetic control 12/25/2019   Sepsis due to urinary tract infection ([place]) 12/03/2019   Sepsis secondary to UTI ([place]) 12/02/2019   Class 3 severe obesity due to excess calories with serious comorbidity and body mass index (BMI) of 40.0 to 44.9 in adult ([place]) 10/24/2018   Back pain 10/24/2018   Elevated LFTs 01/13/2014   Unspecified vitamin D deficiency 01/13/2014   Anemia, iron deficiency 11/17/2012   Essential hypertension, benign 11/17/2012   Pure hypercholesterolemia 11/17/2012    Pollyann Samples, PT 02/12/2021, 9:41 PM  [facility] [address] [place], [place], [address] Phone: [phone]   Fax:  [phone]  Name: Tiffany Le MRN: [id number] Date of Birth: [birth year]

## 2021-02-12 NOTE — Patient Instructions (Signed)
Access Code: [email or web address] URL: [email or web address] Date: 02/12/2021 Prepared by: Pollyann Samples  Exercises Seated Shoulder Flexion Towel Slide at Table Top Full Range of Motion - 1 x daily - 7 x weekly - 2 sets - 30sec hold Seated Shoulder Scaption Slide at Table Top with Forearm in Neutral - 1 x daily - 7 x weekly - 2 sets - 30sec hold Seated Shoulder External Rotation PROM on Table - 1 x daily - 7 x weekly - 2 sets - 30sec hold Seated Scapular Retraction - 1 x daily - 7 x weekly - 10 reps - 5sec hold

## 2021-02-14 ENCOUNTER — Other Ambulatory Visit: Payer: Self-pay

## 2021-02-15 ENCOUNTER — Other Ambulatory Visit: Payer: Self-pay | Admitting: *Deleted

## 2021-02-15 ENCOUNTER — Telehealth: Payer: Self-pay | Admitting: Physical Therapy

## 2021-02-15 ENCOUNTER — Encounter (HOSPITAL_COMMUNITY): Payer: Self-pay

## 2021-02-15 ENCOUNTER — Ambulatory Visit: Payer: Medicaid Other | Admitting: Physical Therapy

## 2021-02-15 NOTE — Patient Outreach (Signed)
[facility]) Care Management  02/15/2021  SHALANA JARDIN 1976/06/22 [id number]   [facility] Unsuccessful outreach  Ms CHRISTYL OSENTOSKI was referred to [facility] on 11/07/20 for EMMI stroke For RED ON [place] [address] # 9       Date: 11/06/20 1000 Tuesday Red Alert Reason: Sad, hopeless, anxious, or empty? Yes Issued smoking cessation, thinking about quitting smoking and stroke preventing secondary stroke EMMIs   Insurance: self pay, Medicaid pending   On 11/26/20 the EMMI alert was addressed and reported resolved Last outreach on 01/08/21 in which she was assisted with medication management or Torsemide and to reschedule her congestive Heart Failure (CHF) clinic appointment  Outreach attempt to the home number to follow  up fall, medicine, medicaid  No answer. [facility] RN CM left HIPAA [facility] Portability and Accountability Act) compliant voicemail message along with CM's contact info.   Plan: [facility] RN CM scheduled this patient for another call attempt within 4-7 business days Unsuccessful outreach letter sent on  Unsuccessful outreach on    [place]. [name], RN, BSN, [place] Coordinator Office number [phone] Mobile number [phone]  Main [facility] number [phone] Fax number [phone]

## 2021-02-15 NOTE — Telephone Encounter (Signed)
PT called patient at [phone] and left voice mail informing patient of missed appointment today at 1215.0 Informed of next scheduled visit on TUE 02/19/21 at 5:00 and to call [phone] with questions. N/S policy mentioned.

## 2021-02-19 ENCOUNTER — Ambulatory Visit: Payer: Medicaid Other | Admitting: Physical Therapy

## 2021-02-19 ENCOUNTER — Telehealth: Payer: Self-pay | Admitting: Physical Therapy

## 2021-02-19 NOTE — Telephone Encounter (Signed)
PT called patient at [phone] and spoke to patient, informing patient of missed appointment today at 500 today.   Patient states she had to go out of town.  Patient informed that the remaining scheduled appointments will be cancelled and she will need to call back to reschedule, from here forward one at a time and to call [phone]. Patient verbalized understanding.

## 2021-02-21 ENCOUNTER — Ambulatory Visit: Payer: Medicaid Other | Admitting: Physical Therapy

## 2021-02-26 ENCOUNTER — Encounter: Payer: Self-pay | Admitting: Physical Therapy

## 2021-02-28 ENCOUNTER — Other Ambulatory Visit: Payer: Self-pay

## 2021-03-12 ENCOUNTER — Other Ambulatory Visit: Payer: Self-pay

## 2021-03-12 ENCOUNTER — Other Ambulatory Visit: Payer: Self-pay | Admitting: *Deleted

## 2021-03-12 NOTE — Patient Outreach (Signed)
[place] [facility]) Care Management  03/12/2021  VALOR TURBERVILLE 06-Jan-1976 [id number]   [facility] outreach follow up with case closure  Ms BEYONCE SAWATZKY was referred to [facility] on 11/07/20 for EMMI stroke For RED ON [place] Day # 9       Date: 11/06/20 1000 Tuesday Red Alert Reason: Sad, hopeless, anxious, or empty? Yes Issued smoking cessation, thinking about quitting smoking and stroke preventing secondary stroke EMMIs   Insurance: self pay, Medicaid approved per pt on 03/12/21   On 11/26/20 the EMMI alert was addressed and reported resolved Last outreach on 01/08/21 in which she was assisted with medication management or Torsemide and to reschedule her congestive Heart Failure (CHF) clinic appointment   Outreach to follow  up fall, medicine, medicaid    Patient is able to verify HIPAA ([place]) identifiers Reviewed and addressed the purpose of the follow up call with the patient  Consent: [facility]) RN CM reviewed [facility] services with patient. Patient gave verbal consent for services.  Assessment Mrs Ausburn reports she is doing much better  She was approved for medicaid and encouraged to take her hospital bills to local Department of Social Services (DSS)  on [address]  She was also encouraged to inquire about her assigned DSS staff  She confirms she now has all her medications   She started outpatient therapies but she had been working independently on her arm at home that after attending her session she was evaluated and found not to need further outpatient therapy She was encouraged by the outpatient therapy staff to continue a list of home work exercises provided to her She was pleased as this also helps her financially RN CM discussed also home non expensive durable medical equipment (DME) she can order   Eye glasses are needed She was provided with DSS staff name Reuben Likes [phone]   And DM eye  financial pt relation of [facility]  program number [id number]  provided website [facility].org (pt/visitor tab, financial assistance) to print an application that generally takes 4- 6 weeks for a response to assist with her getting glasses    No further falls She did review this with her MD  [facility] progression Her EMMI services have concluded and she has greatly improved This was discussed with her along with [facility] EMMI case closure with transfer to her primary care provider (PCP) office social work (SW) staff  She voiced appreciation  Plans Patient agrees to case closure No longer eligible for further [facility] services Pt encouraged to return a call to [facility] RN CM prn Sent case closure letters to pcp, and pt  Sent EPIC note to PCP SW    Goals Addressed               This Visit's Progress     Patient Stated     COMPLETED: [facility]) Decrease inpatient admissions/ readmissions (pt-stated)   On track     Timeframe:  Short Range Goal Priority:  High Start Date:                11/26/20             Expected End Date:  02/07/21                     Follow Up Date  Barriers: Knowledge  Work with pcp and cardiac HF clinic center staff on barriers to home  care, medications and for admission prevention   Notes 03/12/21 no further admission goal met 01/25/21 unsuccessful outreach Noted pt cancelled 01/24/21 CHF clinic appointment 01/08/21 pt reached out prior to her scheduled appointment to receive assist with refilling of needed CHF medicines to prevent re admission as increase in weight, without torsemide x 5 days 12/28/20 discharging home from hospital at time of outreach, awaiting assist from daughter to get her medications filled. Agreed on scheduling of another outreach appointment 12/21/20 remains hospitalized s/p sternal wound debridement with skin substitution and abra on 12/20/20  12/17/20 Re admission noted on 12/14/20 for CHF. Collaboration with pcp SW Manuela Schwartz 12/11/20 unsuccessful EMMI  stroke follow up attempt 11/26/20 successful outreach denied depression s/s related to cva. Confirmed related to filling medications but resolved after pcp visit She denied need of [facility] SW and other resources. Her depression screen on 11/26/20 PHQ2= 1       COMPLETED: [facility]) Manage My Medicine (pt-stated)        Timeframe:  Short-Term Goal Priority:  High Start Date:               01/08/21              Expected End Date:     02/05/21                  Follow Up Date   Barriers: Knowledge  - call for medicine refill 2 or 3 days before it runs out - keep a list of all the medicines I take; vitamins and herbals too    Notes:  03/12/21 has all medicines goal met 01/25/21 unsuccessful outreach Noted pt cancelled 01/24/21 CHF clinic appointment 01/08/21 Pt called [facility] RN CM for assist to get refill of torsemide +, to follow up with cardiac surgeon 01/08/21 and go to cardiac rehab/HF clinic rescheduled appointment 12/24/20 to continue to receive care and medicine refills from HF clinic      COMPLETED: [facility]) Track and Manage Fluids and Swelling-Heart Failure (pt-stated)   On track     Timeframe:  Short-Term Goal Priority:  High Start Date:                        01/08/21     Expected End Date:         02/05/21              Follow Up Date 02/05/21  Barriers: Knowledge  - use salt in moderation - watch for swelling in feet, ankles and legs every day - weigh myself daily      Notes:  03/12/21 symptoms better goal met 02/05/21 continues to have some swelling and is needing another refill on Torsemide She will attempt to outreach to her pharmacy and call  01/25/21 unsuccessful outreach Noted pt cancelled 01/24/21 CHF clinic appointment 01/08/21 weighing Noted wt gain and out of torsemide, called for assist to get refill of torsemide, to follow up with cardiac surgeon and go to cardiac rehab rescheduled appointment voice understanding of CHF action plan        Per Beagley L. [name], RN, BSN, [place] Coordinator Office number [phone] [facility] number [phone] Fax number [phone]

## 2021-03-14 NOTE — Progress Notes (Signed)
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  She had a relatively prolonged hospital stay but was ultimately able to be discharged.  She now presents for her first postoperative visit.  She states that she is slowly improving in regards to her energy level; she endorses mild shortness of breath   Current Outpatient Medications  Medication Sig Dispense Refill   acetaminophen (TYLENOL) 325 MG tablet Take 2 tablets (650 mg total) by mouth every 6 (six) hours as needed for mild pain or headache.     aspirin EC 81 MG tablet Take 81 mg by mouth daily.     dapagliflozin propanediol (FARXIGA) 10 MG TABS tablet Take 1 tablet (10 mg total) by mouth daily. 30 tablet 3   insulin glargine (LANTUS SOLOSTAR) 100 UNIT/ML Solostar Pen Inject 8 Units into the skin 2 (two) times daily.     Multiple Vitamin (MULTIVITAMIN WITH MINERALS) TABS tablet Take 1 tablet by mouth daily.     nitroGLYCERIN (NITROSTAT) 0.4 MG SL tablet DISSOLVE 1 TABLET UNDER THE TONGUE EVERY 5 MINUTES FOR 3 DOSES AS NEEDED FOR CHEST PAIN (Patient taking differently: Place under the tongue every 5 (five) minutes as needed for chest pain.) 25 tablet 7   albuterol (PROVENTIL HFA) 108 (90 Base) MCG/ACT inhaler Inhale 2 puffs into the lungs every 6 (six) hours as needed for wheezing. 18 g 12   atorvastatin (LIPITOR) 80 MG tablet TAKE 1 TABLET (80 MG TOTAL) BY MOUTH DAILY. (Patient taking differently: Take 80 mg by mouth daily.) 30 tablet 3   carvedilol (COREG) 6.25 MG tablet Take 1 tablet (6.25 mg total) by mouth 2 (two) times daily with a meal. 60 tablet 1   clopidogrel (PLAVIX) 75 MG tablet Take 1 tablet (75 mg total) by mouth daily. 30 tablet 3   gabapentin (NEURONTIN) 100 MG capsule  Take 1 capsule (100 mg total) by mouth 3 (three) times daily. 270 capsule 3   glipiZIDE (GLUCOTROL) 10 MG tablet TAKE 1 TABLET BY MOUTH TWICE DAILY BEFORE A MEAL (Patient taking differently: Take 10 mg by mouth 2 (two) times daily before a meal.) 60 tablet 11   Insulin Pen Needle 31G X 5 MM MISC Use as directed with insulin. 300 each 3   isosorbide-hydrALAZINE (BIDIL) 20-37.5 MG tablet Take 0.5 tablets by mouth 3 (three) times daily. 135 tablet 3   sacubitril-valsartan (ENTRESTO) 49-51 MG TAKE 1 TABLET BY MOUTH TWO TIMES DAILY. 60 tablet 3   spironolactone (ALDACTONE) 25 MG tablet Take 1 tablet (25 mg total) by mouth daily. 30 tablet 3   torsemide (DEMADEX) 20 MG tablet Take 1 tablet (20 mg total) by mouth 2 (two) times daily. 60 tablet 3   No current facility-administered medications for this visit.      Physical Exam:   BP 106/70   Pulse 87   Resp 20   Ht '5\' 4"'$  (1.626 m)   Wt 106.1 kg   LMP  (LMP Unknown)   SpO2 94% Comment: RA  BMI 40.17 kg/m   General:  Well-appearing no acute distress  Chest:   Clear to auscultation except at left base  CV:  Regular rate and rhythm  Incisions:  Clean dry and intact  Abdomen:  Soft nontender  Extremities:  Mild edema  Diagnostic Tests:  Chest x-ray from today demonstrates small left effusion and stable mediastinum   Impression:  Doing well after CABG with slow improvement  Plan:  Follow-up as needed with thoracic surgery  I spent in excess of 10 minutes during the conduct of this office consultation and >50% of this time involved direct face-to-face encounter with the patient for counseling and/or coordination of their care.  Level 2                 10 minutes Level 3                 15 minutes Level 4                 25 minutes Level 5                 40 minutes  B. Murvin Natal, MD 03/14/2021 1:57 PM

## 2021-03-22 ENCOUNTER — Telehealth: Payer: Self-pay | Admitting: Clinical

## 2021-03-22 NOTE — Telephone Encounter (Signed)
[facility] General Follow Up Note  03/22/2021 Name: Tiffany Le MRN: [id number] DOB: [birth year] Tiffany Le is a 45 y.o. year old female who sees Vevelyn Francois, NP for primary care.  LCSW was consulted to assess patient's needs and assist the patient with  financial resources .  Interpreter: No.   Interpreter Name & Language: none  Assessment: Patient was experiencing financial constraints related to lack of health coverage.  Ongoing Intervention: Today CSW called patient to follow up on conversation about patient's Medicaid application and appeal status. Patient reported she was approved for Medicaid and has been able to get all her prescriptions. She reports she is doing well and had no additional needs or questions for CSW.   Review of patient status, including review of consultants reports, relevant laboratory and other test results, and collaboration with appropriate care team members and the patient's provider was performed as part of comprehensive patient evaluation and provision of services.    Estanislado Emms, [facility] [phone]

## 2021-04-08 ENCOUNTER — Other Ambulatory Visit: Payer: Self-pay

## 2021-04-09 ENCOUNTER — Other Ambulatory Visit: Payer: Self-pay

## 2021-04-09 ENCOUNTER — Ambulatory Visit: Payer: Medicaid Other | Admitting: Nurse Practitioner

## 2021-04-11 ENCOUNTER — Ambulatory Visit: Payer: Self-pay | Admitting: Nurse Practitioner

## 2021-04-24 ENCOUNTER — Ambulatory Visit: Payer: Medicaid Other | Admitting: Nurse Practitioner

## 2021-05-01 ENCOUNTER — Other Ambulatory Visit: Payer: Self-pay

## 2021-05-01 MED ORDER — CARVEDILOL 6.25 MG PO TABS: 6.2500 mg | ORAL_TABLET | Freq: Two times a day (BID) | ORAL | 1 refills | Status: DC

## 2021-05-07 ENCOUNTER — Other Ambulatory Visit: Payer: Self-pay

## 2021-05-07 ENCOUNTER — Other Ambulatory Visit: Payer: Self-pay | Admitting: Nurse Practitioner

## 2021-05-13 ENCOUNTER — Other Ambulatory Visit: Payer: Self-pay

## 2021-05-13 ENCOUNTER — Other Ambulatory Visit: Payer: Self-pay | Admitting: Nurse Practitioner

## 2021-05-14 ENCOUNTER — Other Ambulatory Visit: Payer: Self-pay

## 2021-05-14 MED ORDER — CLOPIDOGREL BISULFATE 75 MG PO TABS
75.0000 mg | ORAL_TABLET | Freq: Every day | ORAL | 3 refills | Status: DC
  Filled 2021-05-14: qty 30, 30d supply, fill #0
  Filled 2021-06-28: qty 30, 30d supply, fill #1

## 2021-05-14 MED ORDER — ISOSORB DINITRATE-HYDRALAZINE 20-37.5 MG PO TABS
1.0000 | ORAL_TABLET | Freq: Three times a day (TID) | ORAL | 0 refills | Status: AC
  Filled 2021-05-14: qty 90, 30d supply, fill #0

## 2021-05-14 MED ORDER — TORSEMIDE 20 MG PO TABS
20.0000 mg | ORAL_TABLET | Freq: Two times a day (BID) | ORAL | 0 refills | Status: DC
  Filled 2021-05-14: qty 60, 30d supply, fill #0

## 2021-05-15 ENCOUNTER — Other Ambulatory Visit: Payer: Self-pay

## 2021-05-20 ENCOUNTER — Ambulatory Visit (INDEPENDENT_AMBULATORY_CARE_PROVIDER_SITE_OTHER): Payer: Medicaid Other | Admitting: Nurse Practitioner

## 2021-05-20 ENCOUNTER — Telehealth (HOSPITAL_COMMUNITY): Payer: Self-pay

## 2021-05-20 ENCOUNTER — Other Ambulatory Visit: Payer: Self-pay

## 2021-05-20 ENCOUNTER — Encounter: Payer: Self-pay | Admitting: Nurse Practitioner

## 2021-05-20 VITALS — BP 126/68 | HR 90 | Temp 97.7°F | Ht 64.0 in | Wt 248.0 lb

## 2021-05-20 DIAGNOSIS — E1169 Type 2 diabetes mellitus with other specified complication: Secondary | ICD-10-CM | POA: Diagnosis not present

## 2021-05-20 DIAGNOSIS — I10 Essential (primary) hypertension: Secondary | ICD-10-CM

## 2021-05-20 DIAGNOSIS — R221 Localized swelling, mass and lump, neck: Secondary | ICD-10-CM

## 2021-05-20 DIAGNOSIS — I25110 Atherosclerotic heart disease of native coronary artery with unstable angina pectoris: Secondary | ICD-10-CM

## 2021-05-20 DIAGNOSIS — R0609 Other forms of dyspnea: Secondary | ICD-10-CM | POA: Diagnosis not present

## 2021-05-20 DIAGNOSIS — R531 Weakness: Secondary | ICD-10-CM | POA: Diagnosis not present

## 2021-05-20 LAB — POCT GLYCOSYLATED HEMOGLOBIN (HGB A1C): Hemoglobin A1C: 5.9 % — AB (ref 4.0–5.6)

## 2021-05-20 NOTE — Patient Instructions (Signed)
You were seen today in the [facility] for follow up on chronic illness. Labs were collected, results will be available via MyChart or, if abnormal, you will be contacted by clinic staff.  Please follow up in 1 mth for reevaluation of symptoms.

## 2021-05-20 NOTE — Telephone Encounter (Signed)
Called and left patient a voice message to confirm/remind patient of their appointment at the [facility] on 05/21/21.

## 2021-05-20 NOTE — Progress Notes (Signed)
[place], [place]  [address] Phone:  [phone]   Fax:  [phone] Subjective:   Patient ID: Tiffany Le, female    DOB: 10-15-1975, 45 y.o.   MRN: [id number]  Chief Complaint  Patient presents with   Follow-up    Knot on left side of neck, no pain   HPI Tiffany Le 45 y.o. 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 To the [facility] for follow up of chronic illness and "lumps/knots" to neck and arm. Patient states that she has had knots to RUE x 4 wks and one to the left side of neck x 2 days. Denies any pain to affected areas.   Patient also complaining of persistent weakness and shortness of breath with exertion. States that she is only able to walk short distances without experiencing fatigue and shortness of breath. Has had symptoms since completion of cardiac surgery. Informed cardiologist of symptoms and was informed that symptoms would resolve over time. Requesting renewal of handicap sticker  Denies adhering to any specific diet and/ exercise regimen. Currently compliant with all medications. Has not been evaluated by cardiologist in several months, has upcoming appointment in Wednesday. Denies any other complaints today.  Denies fever. Denies any fatigue, chest pain, shortness of breath, HA or dizziness. Denies any blurred vision, numbness or tingling.   Past Medical History:  Diagnosis Date   Coronary artery disease    CVA (cerebral vascular accident) ([place])  09/2020   Diabetes mellitus without complication (HCC)    Diabetic neuropathy ([place]) 02/2020   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   Hyperlipidemia age 27   Hypertension age 50   Hypocalcemia 11/2019   Hypokalemia 11/2019   Iron deficiency anemia    NSTEMI (non-ST elevated myocardial infarction) ([place]) 09/2020   Obesity    Proteinuria 12/09/2019   Pulmonary embolism ([place]) 09/2020    Past Surgical History:  Procedure Laterality Date   APPLICATION OF WOUND VAC N/A 12/14/2020   Procedure: APPLICATION OF WOUND VAC;  Surgeon: Rexene Alberts, MD;  Location: [place];  Service: Thoracic;  Laterality: N/A;   APPLICATION OF WOUND VAC N/A 12/25/2020   Procedure: WOUND VAC CHANGE;  Surgeon: [name], MD;  Location: [place];  Service: Thoracic;  Laterality: N/A;   CARDIAC CATHETERIZATION  09/25/2020   CESAREAN SECTION  3/96   CLIPPING OF ATRIAL APPENDAGE N/A 10/09/2020   Procedure: CLIPPING OF ATRIAL APPENDAGE USING ATRICURE 35MM CLIP;  Surgeon: [name], MD;  Location: [place];  Service: Open Heart Surgery;  Laterality: N/A;   CORONARY ARTERY BYPASS GRAFT N/A 10/09/2020   Procedure: CORONARY ARTERY BYPASS GRAFTING (CABG), ON PUMP, TIMES FOUR, USING LEFT INTERNAL MAMMARY ARTERY AND LEFT RADIAL ARTERY (OPEN HARVEST);  Surgeon: [name], MD;  Location: [place];  Service: Open Heart Surgery;  Laterality: N/A;  POSSIBLE BIMA   INCISION AND DRAINAGE OF WOUND N/A 12/20/2020   Procedure: STERNAL WOUND DEBRIDEMENT WITH SKIN SUBSTITUTION AND ABRA;  Surgeon: Wallace Going, DO;  Location: [place];  Service: Plastics;  Laterality: N/A;   IR THORACENTESIS ASP PLEURAL SPACE W/IMG GUIDE  10/16/2020  LEFT HEART CATH AND CORONARY ANGIOGRAPHY N/A 09/25/2020   Procedure: LEFT HEART CATH AND CORONARY ANGIOGRAPHY;  Surgeon: Leonie Man, MD;  Location: [place] [facility];  Service: Cardiovascular;  Laterality: N/A;   ORIF FEMUR FRACTURE     RADIAL ARTERY HARVEST Left 10/09/2020   Procedure: RADIAL ARTERY HARVEST;   Surgeon: [name], MD;  Location: [place];  Service: Open Heart Surgery;  Laterality: Left;   STERNAL CLOSURE N/A 12/25/2020   Procedure: STERNAL CLOSURE;  Surgeon: [name], MD;  Location: [place] [facility];  Service: Thoracic;  Laterality: N/A;   STERNAL WOUND DEBRIDEMENT N/A 12/14/2020   Procedure: STERNAL WOUND DEBRIDEMENT;  Surgeon: Rexene Alberts, MD;  Location: [place];  Service: Thoracic;  Laterality: N/A;   STERNAL WOUND DEBRIDEMENT N/A 12/18/2020   Procedure: STERNAL WOUND DEBRIDEMENT, Wound Vac Change;  Surgeon: [name], MD;  Location: [place];  Service: Thoracic;  Laterality: N/A;   STERNAL WOUND DEBRIDEMENT N/A 12/25/2020   Procedure: STERNAL WOUND IRRIGATION AND DEBRIDEMENT;  Surgeon: [name], MD;  [facility];  Service: Thoracic;  Laterality: N/A;   TEE WITHOUT CARDIOVERSION N/A 10/09/2020   Procedure: TRANSESOPHAGEAL ECHOCARDIOGRAM (TEE);  Surgeon: [name], MD;  Location: [facility];  Service: Open Heart Surgery;  Laterality: N/A;    Family History  Problem Relation Age of Onset   Heart disease Mother        CABG <50   Hypertension Mother    Diabetes Mother    Hyperlipidemia Mother    Heart disease Father    Hyperlipidemia Father    Hypertension Father    Early death Father    Healthy Daughter    Diabetes Maternal Aunt    Cancer Maternal Grandmother        stomach cancer   Diabetes Maternal Aunt    Cancer Cousin        colon cancer (dx'd 3)    Social History   Socioeconomic History   Marital status: Single    Spouse name: Not on file   Number of children: Not on file   Years of education: Not on file   Highest education level: Not on file  Occupational History   Occupation: Event organiser  Tobacco Use   Smoking status: Never   Smokeless tobacco: Never  Vaping Use   Vaping Use: Never used  Substance and Sexual Activity   Alcohol use: No   Drug use: No   Sexual activity: Not Currently    Partners: Male    Comment: husband  currently in [place], returning 02/2013  Other Topics Concern   Not on file  Social History Narrative   Works at a call center, 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  Lives with 80 year old daughter, 1 cat Husband is living in [place] (citizen there), trying to come to [place] (has been delayed)   Social Determinants of Radio broadcast assistant Strain: High Risk   Difficulty of Paying Living Expenses: Hard  Food Insecurity: Food Insecurity Present   Worried About Charity fundraiser in the Last Year: Sometimes true   Arboriculturist in the Last Year: Never true  Transportation Needs: No Transportation Needs   Lack of Transportation (Medical): No   Lack of Transportation (Non-Medical): No  Physical Activity: Not on file  Stress: Stress Concern Present   Feeling of Stress : Rather much  Social Connections: Moderately Integrated   Frequency of Communication  with Friends and Family: More than three times a week   Frequency of Social Gatherings with Friends and Family: More than three times a week   Attends Religious Services: 1 to 4 times per year   Active Member of [employer] or Organizations: Yes   Attends Archivist Meetings: 1 to 4 times per year   Marital Status: Never married  Human resources officer Violence: Not At Risk   Fear of Current or Ex-Partner: No   Emotionally Abused: No   Physically Abused: No   Sexually Abused: No    Outpatient Medications Prior to Visit  Medication Sig Dispense Refill   acetaminophen (TYLENOL) 325 MG tablet Take 2 tablets (650 mg total) by mouth every 6 (six) hours as needed for mild pain or headache.     albuterol (PROVENTIL HFA) 108 (90 Base) MCG/ACT inhaler Inhale 2 puffs into the lungs every 6 (six) hours as needed for wheezing. 18 g 12   aspirin EC 81 MG tablet Take 81 mg by mouth daily.     carvedilol (COREG) 6.25 MG tablet Take 1 tablet (6.25 mg total) by mouth 2 (two) times daily with a meal. 60 tablet 1   clopidogrel (PLAVIX) 75 MG  tablet Take 1 tablet (75 mg total) by mouth daily. 30 tablet 3   gabapentin (NEURONTIN) 100 MG capsule Take 1 capsule (100 mg total) by mouth 3 (three) times daily. 270 capsule 3   glipiZIDE (GLUCOTROL) 10 MG tablet TAKE 1 TABLET BY MOUTH TWICE DAILY BEFORE A MEAL (Patient taking differently: Take 10 mg by mouth 2 (two) times daily before a meal.) 60 tablet 11   insulin glargine (LANTUS SOLOSTAR) 100 UNIT/ML Solostar Pen Inject 8 Units into the skin 2 (two) times daily.     isosorbide-hydrALAZINE (BIDIL) 20-37.5 MG tablet Take 0.5 tablets by mouth 3 (three) times daily. 135 tablet 3   isosorbide-hydrALAZINE (BIDIL) 20-37.5 MG tablet Take 1 tablet by mouth 3 (three) times daily. 90 tablet 0   nitroGLYCERIN (NITROSTAT) 0.4 MG SL tablet DISSOLVE 1 TABLET UNDER THE TONGUE EVERY 5 MINUTES FOR 3 DOSES AS NEEDED FOR CHEST PAIN (Patient taking differently: Place under the tongue every 5 (five) minutes as needed for chest pain.) 25 tablet 7   sacubitril-valsartan (ENTRESTO) 49-51 MG TAKE 1 TABLET BY MOUTH TWO TIMES DAILY. 60 tablet 3   spironolactone (ALDACTONE) 25 MG tablet Take 1 tablet (25 mg total) by mouth daily. 30 tablet 3   torsemide (DEMADEX) 20 MG tablet Take 1 tablet (20 mg total) by mouth 2 (two) times daily. 60 tablet 0   Multiple Vitamin (MULTIVITAMIN WITH MINERALS) TABS tablet Take 1 tablet by mouth daily.     atorvastatin (LIPITOR) 80 MG tablet TAKE 1 TABLET (80 MG TOTAL) BY MOUTH DAILY. (Patient not taking: Reported on 05/20/2021) 30 tablet 3   dapagliflozin propanediol (FARXIGA) 10 MG TABS tablet Take 1 tablet (10 mg total) by mouth daily. 30 tablet 3   Insulin Pen Needle 31G X 5 MM MISC Use as directed with insulin. 300 each 3   No facility-administered medications prior to visit.    Allergies  Allergen Reactions   Penicillins Rash    Historical Has patient had a PCN reaction causing immediate rash, facial/tongue/throat swelling, SOB or lightheadedness with hypotension: No Has  patient had a PCN reaction causing severe rash involving mucus membranes or skin necrosis: No Has patient had a PCN reaction that required hospitalization: No Has patient had a PCN reaction occurring within the last  10 years: No If all of the above answers are "NO", then may proceed with Cephalosporin use. Has tolerated cephalosporins    Review of Systems  Constitutional:  Positive for malaise/fatigue. Negative for chills and fever.  HENT: Negative.    Eyes: Negative.   Respiratory:  Positive for shortness of breath. Negative for cough.   Cardiovascular:  Negative for chest pain, palpitations and leg swelling.  Gastrointestinal:  Negative for abdominal pain, blood in stool, constipation, diarrhea, nausea and vomiting.  Musculoskeletal: Negative.   Skin:        Mass on RUE and left neck  Neurological: Negative.   Psychiatric/Behavioral:  Negative for depression. The patient is not nervous/anxious.   All other systems reviewed and are negative.     Objective:    Physical Exam Vitals reviewed.  Constitutional:      General: She is not in acute distress.    Appearance: Normal appearance. She is obese. She is not ill-appearing.  HENT:     Head: Normocephalic.     Right Ear: Tympanic membrane, ear canal and external ear normal.     Left Ear: Tympanic membrane, ear canal and external ear normal.     Nose: Nose normal.     Mouth/Throat:     Mouth: Mucous membranes are moist.     Pharynx: Oropharynx is clear.  Eyes:     Extraocular Movements: Extraocular movements intact.     Conjunctiva/sclera: Conjunctivae normal.     Pupils: Pupils are equal, round, and reactive to light.  Cardiovascular:     Rate and Rhythm: Normal rate and regular rhythm.     Pulses: Normal pulses.     Heart sounds: Normal heart sounds.     Comments: No obvious peripheral edema Pulmonary:     Effort: Pulmonary effort is normal.     Breath sounds: Normal breath sounds.  Lymphadenopathy:     Head:      Left side of head: Submental and submandibular adenopathy present.     Upper Body:     Right upper body: Epitrochlear adenopathy present.     Comments: Multiple areas of lymphadenopathy noted, with the largest noted in the left lateral neck. Mass is 1-2 cm in diameter, fixed  and non tender to palpation  Skin:    General: Skin is warm and dry.     Capillary Refill: Capillary refill takes less than 2 seconds.  Neurological:     General: No focal deficit present.     Mental Status: She is alert and oriented to person, place, and time.  Psychiatric:        Mood and Affect: Mood normal.        Behavior: Behavior normal.        Thought Content: Thought content normal.        Judgment: Judgment normal.    BP 126/68   Pulse 90   Temp 97.7 F (36.5 C)   Ht 5\' 4"  (1.626 m)   Wt 248 lb 0.2 oz (112.5 kg)   LMP 05/13/2021   SpO2 100%   BMI 42.57 kg/m  Wt Readings from Last 3 Encounters:  05/20/21 248 lb 0.2 oz (112.5 kg)  01/08/21 251 lb 3.2 oz (113.9 kg)  01/03/21 244 lb (110.7 kg)     There is no immunization history on file for this patient.  Diabetic Foot Exam - Simple   No data filed     Lab Results  Component Value Date   TSH 1.610 12/09/2019  Lab Results  Component Value Date   WBC 6.8 12/22/2020   HGB 9.0 (L) 12/22/2020   HCT 28.0 (L) 12/22/2020   MCV 84.6 12/22/2020   PLT 294 12/22/2020   Lab Results  Component Value Date   NA 135 12/24/2020   K 4.0 12/24/2020   CO2 27 12/24/2020   GLUCOSE 157 (H) 12/24/2020   BUN 29 (H) 12/24/2020   CREATININE 1.15 (H) 12/24/2020   BILITOT 0.2 (L) 12/16/2020   ALKPHOS 59 12/16/2020   AST 10 (L) 12/16/2020   ALT 9 12/16/2020   PROT 6.7 12/16/2020   ALBUMIN 2.8 (L) 12/16/2020   CALCIUM 8.6 (L) 12/24/2020   ANIONGAP 8 12/24/2020   Lab Results  Component Value Date   CHOL 131 09/25/2020   CHOL 165 12/09/2019   CHOL 248 (H) 10/19/2018   Lab Results  Component Value Date   HDL 30 (L) 09/25/2020   HDL 31 (L)  12/09/2019   HDL 39 (L) 10/19/2018   Lab Results  Component Value Date   LDLCALC 91 09/25/2020   LDLCALC 123 (H) 12/09/2019   LDLCALC 190 (H) 10/19/2018   Lab Results  Component Value Date   TRIG 57 10/03/2020   TRIG 1,841 (H) 10/03/2020   TRIG 49 09/25/2020   Lab Results  Component Value Date   CHOLHDL 4.4 09/25/2020   CHOLHDL 5.3 (H) 12/09/2019   CHOLHDL 6.4 (H) 10/19/2018   Lab Results  Component Value Date   HGBA1C 5.9 (A) 05/20/2021   HGBA1C 7.5 (H) 10/09/2020   HGBA1C 7.8 (H) 09/26/2020       Assessment & Plan:  Based on patient history and PE today concerned for malignancy v electrolyte abnormality v CHF v other cardiac etiology v thyroid disease  Orders placed described below:  Problem List Items Addressed This Visit       Cardiovascular and Mediastinum   Essential hypertension, benign (Chronic)   Relevant Orders   POCT URINALYSIS DIP (CLINITEK)   CBC with Differential/Platelet   Comprehensive metabolic panel   Lipid panel Encouraged continued diet and exercise efforts  Encouraged continued compliance with medication     Coronary artery disease involving native coronary artery of native heart with unstable angina pectoris ([place])   Relevant Orders   ECHOCARDIOGRAM COMPLETE Informed to maintain upcoming follow up with cardiology    Other Visit Diagnoses     Type 2 diabetes mellitus with other specified complication, without long-term current use of insulin ([place])    -  Primary   Relevant Orders   HgB A1c (Completed)   POCT URINALYSIS DIP (CLINITEK)   CBC with Differential/Platelet   Comprehensive metabolic panel   Lipid panel   Dyspnea on exertion       Relevant Orders   Pro b natriuretic peptide   ECHOCARDIOGRAM COMPLETE   Weakness       Relevant Orders   ECHOCARDIOGRAM COMPLETE   TSH   Vitamin D, 25-hydroxy   Neck mass       Relevant Orders   CT Soft Tissue Neck W Contrast   Follow up in 1 mth for reevaluation of symptoms, sooner as  needed    I have discontinued [place] A. Dexter's multivitamin with minerals. I am also having her maintain her aspirin EC, nitroGLYCERIN, dapagliflozin propanediol, acetaminophen, Lantus SoloStar, atorvastatin, gabapentin, spironolactone, glipiZIDE, sacubitril-valsartan, Insulin Pen Needle, albuterol, isosorbide-hydrALAZINE, carvedilol, isosorbide-hydrALAZINE, clopidogrel, and torsemide.  No orders of the defined types were placed in this encounter.    Teena Dunk,  NP

## 2021-05-21 ENCOUNTER — Ambulatory Visit (HOSPITAL_COMMUNITY)
Admission: RE | Admit: 2021-05-21 | Discharge: 2021-05-21 | Disposition: A | Discharge status: Home / Self Care | Payer: Medicaid Other | Source: Ambulatory Visit | Attending: Nurse Practitioner | Admitting: Nurse Practitioner

## 2021-05-21 ENCOUNTER — Other Ambulatory Visit: Payer: Self-pay | Admitting: Nurse Practitioner

## 2021-05-21 ENCOUNTER — Encounter (HOSPITAL_COMMUNITY): Payer: Self-pay

## 2021-05-21 ENCOUNTER — Other Ambulatory Visit: Payer: Self-pay

## 2021-05-21 DIAGNOSIS — E559 Vitamin D deficiency, unspecified: Secondary | ICD-10-CM

## 2021-05-21 DIAGNOSIS — R221 Localized swelling, mass and lump, neck: Secondary | ICD-10-CM | POA: Insufficient documentation

## 2021-05-21 LAB — CBC WITH DIFFERENTIAL/PLATELET
Basophils Absolute: 0 10*3/uL (ref 0.0–0.2)
Basos: 0 %
EOS (ABSOLUTE): 0.1 10*3/uL (ref 0.0–0.4)
Eos: 1 %
Hematocrit: 29.6 % — ABNORMAL LOW (ref 34.0–46.6)
Hemoglobin: 9.6 g/dL — ABNORMAL LOW (ref 11.1–15.9)
Immature Grans (Abs): 0 10*3/uL (ref 0.0–0.1)
Immature Granulocytes: 0 %
Lymphocytes Absolute: 2.2 10*3/uL (ref 0.7–3.1)
Lymphs: 27 %
MCH: 27.1 pg (ref 26.6–33.0)
MCHC: 32.4 g/dL (ref 31.5–35.7)
MCV: 84 fL (ref 79–97)
Monocytes Absolute: 0.9 10*3/uL (ref 0.1–0.9)
Monocytes: 12 %
Neutrophils Absolute: 4.7 10*3/uL (ref 1.4–7.0)
Neutrophils: 60 %
Platelets: 278 10*3/uL (ref 150–450)
RBC: 3.54 x10E6/uL — ABNORMAL LOW (ref 3.77–5.28)
RDW: 13.6 % (ref 11.7–15.4)
WBC: 7.9 10*3/uL (ref 3.4–10.8)

## 2021-05-21 LAB — COMPREHENSIVE METABOLIC PANEL
ALT: 9 IU/L (ref 0–32)
AST: 13 IU/L (ref 0–40)
Albumin/Globulin Ratio: 0.9 — ABNORMAL LOW (ref 1.2–2.2)
Albumin: 3.6 g/dL — ABNORMAL LOW (ref 3.8–4.8)
Alkaline Phosphatase: 122 IU/L — ABNORMAL HIGH (ref 44–121)
BUN/Creatinine Ratio: 15 (ref 9–23)
BUN: 14 mg/dL (ref 6–24)
Bilirubin Total: <0.2 mg/dL (ref 0.0–1.2)
CO2: 24 mmol/L (ref 20–29)
Calcium: 8.7 mg/dL (ref 8.7–10.2)
Chloride: 104 mmol/L (ref 96–106)
Creatinine, Ser: 0.93 mg/dL (ref 0.57–1.00)
Globulin, Total: 4.1 g/dL (ref 1.5–4.5)
Glucose: 120 mg/dL — ABNORMAL HIGH (ref 70–99)
Potassium: 4.1 mmol/L (ref 3.5–5.2)
Sodium: 141 mmol/L (ref 134–144)
Total Protein: 7.7 g/dL (ref 6.0–8.5)
eGFR: 77 mL/min/{1.73_m2} (ref >59)

## 2021-05-21 LAB — PRO B NATRIURETIC PEPTIDE: NT-Pro BNP: 1091 pg/mL — ABNORMAL HIGH (ref 0–249)

## 2021-05-21 LAB — LIPID PANEL
Chol/HDL Ratio: 5.6 ratio — ABNORMAL HIGH (ref 0.0–4.4)
Cholesterol, Total: 185 mg/dL (ref 100–199)
HDL: 33 mg/dL — ABNORMAL LOW (ref >39)
LDL Chol Calc (NIH): 141 mg/dL — ABNORMAL HIGH (ref 0–99)
Triglycerides: 59 mg/dL (ref 0–149)
VLDL Cholesterol Cal: 11 mg/dL (ref 5–40)

## 2021-05-21 LAB — VITAMIN D 25 HYDROXY (VIT D DEFICIENCY, FRACTURES): Vit D, 25-Hydroxy: 5.2 ng/mL — ABNORMAL LOW (ref 30.0–100.0)

## 2021-05-21 LAB — TSH: TSH: 3 u[IU]/mL (ref 0.450–4.500)

## 2021-05-21 MED ORDER — IOHEXOL 350 MG/ML SOLN
80.0000 mL | Freq: Once | INTRAVENOUS | Status: AC | PRN
  Administered 2021-05-21: 80 mL via INTRAVENOUS

## 2021-05-21 MED ORDER — VITAMIN D (ERGOCALCIFEROL) 1.25 MG (50000 UNIT) PO CAPS
50000.0000 [IU] | ORAL_CAPSULE | ORAL | 1 refills | Status: AC
  Filled 2021-05-21: qty 4, 28d supply, fill #0

## 2021-05-24 ENCOUNTER — Other Ambulatory Visit: Payer: Self-pay

## 2021-05-24 DIAGNOSIS — R0609 Other forms of dyspnea: Secondary | ICD-10-CM

## 2021-05-28 ENCOUNTER — Other Ambulatory Visit: Payer: Self-pay

## 2021-05-29 ENCOUNTER — Other Ambulatory Visit: Payer: Self-pay | Admitting: Nurse Practitioner

## 2021-05-29 DIAGNOSIS — R591 Generalized enlarged lymph nodes: Secondary | ICD-10-CM

## 2021-05-31 ENCOUNTER — Encounter: Payer: Self-pay | Admitting: Cardiology

## 2021-05-31 ENCOUNTER — Ambulatory Visit (HOSPITAL_COMMUNITY): Payer: Medicaid Other | Attending: Cardiology

## 2021-05-31 ENCOUNTER — Telehealth: Payer: Self-pay | Admitting: Hematology and Oncology

## 2021-05-31 ENCOUNTER — Encounter (HOSPITAL_COMMUNITY): Payer: Self-pay | Admitting: Nurse Practitioner

## 2021-05-31 NOTE — Telephone Encounter (Signed)
Scheduled appt per 10/19 referral. Pt is aware of appt date and time. And is aware of arrive 15 mins early to appt.

## 2021-05-31 NOTE — Progress Notes (Unsigned)
Patient ID: Tiffany Le, female   DOB: 05/26/1976, 45 y.o.   MRN: [id number]  Verified appointment "no show" status with Leanne at 7:29am.

## 2021-06-03 ENCOUNTER — Other Ambulatory Visit: Payer: Medicaid Other

## 2021-06-03 ENCOUNTER — Encounter: Payer: Medicaid Other | Admitting: Hematology and Oncology

## 2021-06-14 ENCOUNTER — Telehealth (HOSPITAL_COMMUNITY): Payer: Self-pay | Admitting: Nurse Practitioner

## 2021-06-14 NOTE — Telephone Encounter (Signed)
Just an FYI. We have made several attempts to contact this patient including sending a letter to schedule or reschedule their echocardiogram. We will be removing the patient from the echo WQ.   05/31/21 NO SHOWED-MAILED LETTER [facility]     Thank you

## 2021-06-21 ENCOUNTER — Ambulatory Visit: Payer: Medicaid Other | Admitting: Nurse Practitioner

## 2021-06-28 ENCOUNTER — Other Ambulatory Visit: Payer: Self-pay

## 2021-06-28 ENCOUNTER — Other Ambulatory Visit: Payer: Self-pay | Admitting: Nurse Practitioner

## 2021-06-28 MED ORDER — TORSEMIDE 20 MG PO TABS
20.0000 mg | ORAL_TABLET | Freq: Two times a day (BID) | ORAL | 0 refills | Status: DC
  Filled 2021-06-28: qty 60, 30d supply, fill #0

## 2021-07-02 ENCOUNTER — Other Ambulatory Visit: Payer: Self-pay

## 2021-07-02 ENCOUNTER — Ambulatory Visit: Payer: Medicaid Other | Admitting: Nurse Practitioner

## 2021-07-10 ENCOUNTER — Ambulatory Visit: Payer: Medicaid Other | Admitting: Nurse Practitioner

## 2021-07-16 ENCOUNTER — Other Ambulatory Visit: Payer: Self-pay

## 2021-07-16 ENCOUNTER — Emergency Department (HOSPITAL_COMMUNITY): Payer: Medicaid Other

## 2021-07-16 ENCOUNTER — Emergency Department (HOSPITAL_COMMUNITY)
Admission: EM | Admit: 2021-07-16 | Discharge: 2021-07-16 | Disposition: A | Discharge status: Home / Self Care | Payer: Medicaid Other | Attending: Emergency Medicine | Admitting: Emergency Medicine

## 2021-07-16 ENCOUNTER — Encounter (HOSPITAL_COMMUNITY): Payer: Self-pay

## 2021-07-16 DIAGNOSIS — Z794 Long term (current) use of insulin: Secondary | ICD-10-CM | POA: Insufficient documentation

## 2021-07-16 DIAGNOSIS — Z79899 Other long term (current) drug therapy: Secondary | ICD-10-CM | POA: Diagnosis not present

## 2021-07-16 DIAGNOSIS — Z7984 Long term (current) use of oral hypoglycemic drugs: Secondary | ICD-10-CM | POA: Diagnosis not present

## 2021-07-16 DIAGNOSIS — E1140 Type 2 diabetes mellitus with diabetic neuropathy, unspecified: Secondary | ICD-10-CM | POA: Diagnosis not present

## 2021-07-16 DIAGNOSIS — Z951 Presence of aortocoronary bypass graft: Secondary | ICD-10-CM | POA: Insufficient documentation

## 2021-07-16 DIAGNOSIS — I5043 Acute on chronic combined systolic (congestive) and diastolic (congestive) heart failure: Secondary | ICD-10-CM | POA: Diagnosis not present

## 2021-07-16 DIAGNOSIS — Z7982 Long term (current) use of aspirin: Secondary | ICD-10-CM | POA: Diagnosis not present

## 2021-07-16 DIAGNOSIS — I25118 Atherosclerotic heart disease of native coronary artery with other forms of angina pectoris: Secondary | ICD-10-CM | POA: Insufficient documentation

## 2021-07-16 DIAGNOSIS — I110 Hypertensive heart disease with heart failure: Secondary | ICD-10-CM | POA: Insufficient documentation

## 2021-07-16 DIAGNOSIS — Z7902 Long term (current) use of antithrombotics/antiplatelets: Secondary | ICD-10-CM | POA: Insufficient documentation

## 2021-07-16 DIAGNOSIS — Y92410 Unspecified street and highway as the place of occurrence of the external cause: Secondary | ICD-10-CM | POA: Diagnosis not present

## 2021-07-16 DIAGNOSIS — S39012A Strain of muscle, fascia and tendon of lower back, initial encounter: Secondary | ICD-10-CM | POA: Insufficient documentation

## 2021-07-16 DIAGNOSIS — S34109A Unspecified injury to unspecified level of lumbar spinal cord, initial encounter: Secondary | ICD-10-CM | POA: Diagnosis present

## 2021-07-16 MED ORDER — HYDROCODONE-ACETAMINOPHEN 5-325 MG PO TABS
1.0000 | ORAL_TABLET | Freq: Once | ORAL | Status: DC
  Filled 2021-07-16: qty 1

## 2021-07-16 MED ORDER — TRAMADOL HCL 50 MG PO TABS
50.0000 mg | ORAL_TABLET | Freq: Once | ORAL | Status: AC
  Administered 2021-07-16: 50 mg via ORAL
  Filled 2021-07-16: qty 1

## 2021-07-16 MED ORDER — METHOCARBAMOL 500 MG PO TABS: 500.0000 mg | ORAL_TABLET | Freq: Three times a day (TID) | ORAL | 0 refills | Status: DC | PRN
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
## 2021-07-16 NOTE — ED Notes (Signed)
Provider at bedside
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
## 2021-07-16 NOTE — ED Notes (Signed)
Patient transported to X-ray 

## 2021-08-12 ENCOUNTER — Other Ambulatory Visit: Payer: Self-pay | Admitting: Nurse Practitioner

## 2021-08-13 ENCOUNTER — Telehealth: Payer: Self-pay

## 2021-08-13 ENCOUNTER — Other Ambulatory Visit: Payer: Self-pay

## 2021-08-13 DIAGNOSIS — I10 Essential (primary) hypertension: Secondary | ICD-10-CM

## 2021-08-13 DIAGNOSIS — I25110 Atherosclerotic heart disease of native coronary artery with unstable angina pectoris: Secondary | ICD-10-CM

## 2021-08-13 MED ORDER — CARVEDILOL 6.25 MG PO TABS: 6.2500 mg | ORAL_TABLET | Freq: Two times a day (BID) | ORAL | 0 refills | Status: DC

## 2021-08-13 MED ORDER — TORSEMIDE 20 MG PO TABS: 20.0000 mg | ORAL_TABLET | Freq: Two times a day (BID) | ORAL | 0 refills | Status: DC

## 2021-08-13 NOTE — Telephone Encounter (Signed)
Water pill Clopidogrel   [facility] on [address]

## 2021-08-13 NOTE — Telephone Encounter (Signed)
Refills have been sent to the patient pharmacy [facility].

## 2021-08-14 ENCOUNTER — Other Ambulatory Visit: Payer: Self-pay | Admitting: Nurse Practitioner

## 2021-08-14 DIAGNOSIS — I2699 Other pulmonary embolism without acute cor pulmonale: Secondary | ICD-10-CM

## 2021-08-14 MED ORDER — CLOPIDOGREL BISULFATE 75 MG PO TABS: 75.0000 mg | ORAL_TABLET | Freq: Every day | ORAL | 3 refills | Status: DC

## 2021-08-20 ENCOUNTER — Ambulatory Visit (INDEPENDENT_AMBULATORY_CARE_PROVIDER_SITE_OTHER): Payer: Medicaid Other | Admitting: Nurse Practitioner

## 2021-08-20 ENCOUNTER — Other Ambulatory Visit: Payer: Self-pay

## 2021-08-20 ENCOUNTER — Encounter: Payer: Self-pay | Admitting: Nurse Practitioner

## 2021-08-20 VITALS — BP 140/72 | HR 67 | Temp 97.4°F | Ht 64.0 in | Wt 249.0 lb

## 2021-08-20 DIAGNOSIS — E7849 Other hyperlipidemia: Secondary | ICD-10-CM

## 2021-08-20 DIAGNOSIS — R82998 Other abnormal findings in urine: Secondary | ICD-10-CM

## 2021-08-20 DIAGNOSIS — E1140 Type 2 diabetes mellitus with diabetic neuropathy, unspecified: Secondary | ICD-10-CM

## 2021-08-20 DIAGNOSIS — E559 Vitamin D deficiency, unspecified: Secondary | ICD-10-CM

## 2021-08-20 DIAGNOSIS — E538 Deficiency of other specified B group vitamins: Secondary | ICD-10-CM

## 2021-08-20 DIAGNOSIS — E1169 Type 2 diabetes mellitus with other specified complication: Secondary | ICD-10-CM

## 2021-08-20 DIAGNOSIS — T733XXA Exhaustion due to excessive exertion, initial encounter: Secondary | ICD-10-CM

## 2021-08-20 LAB — POCT GLYCOSYLATED HEMOGLOBIN (HGB A1C)
HbA1c POC (<> result, manual entry): 6.4 % (ref 4.0–5.6)
HbA1c, POC (controlled diabetic range): 6.4 % (ref 0.0–7.0)
HbA1c, POC (prediabetic range): 6.4 % (ref 5.7–6.4)
Hemoglobin A1C: 6.4 % — AB (ref 4.0–5.6)

## 2021-08-20 LAB — POCT URINALYSIS DIP (CLINITEK)
Bilirubin, UA: NEGATIVE
Glucose, UA: 500 mg/dL — AB
Ketones, POC UA: NEGATIVE mg/dL
Nitrite, UA: NEGATIVE
POC PROTEIN,UA: 30 — AB
Spec Grav, UA: 1.025 (ref 1.010–1.025)
Urobilinogen, UA: 0.2 E.U./dL
pH, UA: 6 (ref 5.0–8.0)

## 2021-08-20 MED ORDER — GABAPENTIN 300 MG PO CAPS: 300.0000 mg | ORAL_CAPSULE | Freq: Two times a day (BID) | ORAL | 2 refills | Status: DC

## 2021-08-20 MED ORDER — SIMVASTATIN 40 MG PO TABS: 40.0000 mg | ORAL_TABLET | Freq: Every day | ORAL | 3 refills | Status: DC
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
## 2021-08-20 NOTE — Patient Instructions (Signed)
You were seen today in the [facility] for follow up for chronic illness. Labs were collected, results will be available via MyChart or, if abnormal, you will be contacted by clinic staff. You were prescribed medications, please take as directed. Please follow up in 3 mths for reevaluation of chronic illness.

## 2021-08-21 LAB — CBC WITH DIFFERENTIAL/PLATELET
Basophils Absolute: 0 10*3/uL (ref 0.0–0.2)
Basos: 1 %
EOS (ABSOLUTE): 0.1 10*3/uL (ref 0.0–0.4)
Eos: 1 %
Hematocrit: 35.3 % (ref 34.0–46.6)
Hemoglobin: 11.7 g/dL (ref 11.1–15.9)
Immature Grans (Abs): 0 10*3/uL (ref 0.0–0.1)
Immature Granulocytes: 0 %
Lymphocytes Absolute: 1.8 10*3/uL (ref 0.7–3.1)
Lymphs: 40 %
MCH: 27.7 pg (ref 26.6–33.0)
MCHC: 33.1 g/dL (ref 31.5–35.7)
MCV: 84 fL (ref 79–97)
Monocytes Absolute: 0.7 10*3/uL (ref 0.1–0.9)
Monocytes: 16 %
Neutrophils Absolute: 1.9 10*3/uL (ref 1.4–7.0)
Neutrophils: 42 %
Platelets: 319 10*3/uL (ref 150–450)
RBC: 4.23 x10E6/uL (ref 3.77–5.28)
RDW: 13.7 % (ref 11.7–15.4)
WBC: 4.6 10*3/uL (ref 3.4–10.8)

## 2021-08-21 LAB — COMPREHENSIVE METABOLIC PANEL
ALT: 11 IU/L (ref 0–32)
AST: 15 IU/L (ref 0–40)
Albumin/Globulin Ratio: 1.2 (ref 1.2–2.2)
Albumin: 4 g/dL (ref 3.8–4.8)
Alkaline Phosphatase: 115 IU/L (ref 44–121)
BUN/Creatinine Ratio: 18 (ref 9–23)
BUN: 17 mg/dL (ref 6–24)
Bilirubin Total: <0.2 mg/dL (ref 0.0–1.2)
CO2: 24 mmol/L (ref 20–29)
Calcium: 9 mg/dL (ref 8.7–10.2)
Chloride: 104 mmol/L (ref 96–106)
Creatinine, Ser: 0.95 mg/dL (ref 0.57–1.00)
Globulin, Total: 3.4 g/dL (ref 1.5–4.5)
Glucose: 96 mg/dL (ref 70–99)
Potassium: 4.1 mmol/L (ref 3.5–5.2)
Sodium: 141 mmol/L (ref 134–144)
Total Protein: 7.4 g/dL (ref 6.0–8.5)
eGFR: 75 mL/min/{1.73_m2} (ref >59)

## 2021-08-21 LAB — VITAMIN D 25 HYDROXY (VIT D DEFICIENCY, FRACTURES): Vit D, 25-Hydroxy: 6 ng/mL — ABNORMAL LOW (ref 30.0–100.0)

## 2021-08-21 LAB — VITAMIN B12: Vitamin B-12: 225 pg/mL — ABNORMAL LOW (ref 232–1245)

## 2021-08-21 MED ORDER — VITAMIN B-12 1000 MCG PO TABS: 1000.0000 ug | ORAL_TABLET | ORAL | 0 refills | Status: AC

## 2021-08-21 MED ORDER — VITAMIN D (ERGOCALCIFEROL) 1.25 MG (50000 UNIT) PO CAPS: 50000.0000 [IU] | ORAL_CAPSULE | ORAL | 0 refills | Status: DC

## 2021-08-22 LAB — URINE CULTURE

## 2021-08-29 ENCOUNTER — Other Ambulatory Visit (HOSPITAL_COMMUNITY): Payer: Self-pay

## 2021-08-29 ENCOUNTER — Telehealth (HOSPITAL_COMMUNITY): Payer: Self-pay | Admitting: Pharmacy Technician

## 2021-08-29 NOTE — Telephone Encounter (Signed)
Patient Advocate Encounter   Received notification from [facility] that prior authorization for Delene Loll is required.   PA submitted on CoverMyMeds Key [id number] Status is pending   Will continue to follow.

## 2021-08-29 NOTE — Telephone Encounter (Signed)
Advanced Heart Failure Patient Advocate Encounter  Prior Authorization for Delene Loll has been approved.    PA# [id number] Effective dates: 08/29/21 through 08/28/22  Patients co-pay is $0 (90 days)   Farxiga 90 day copay is $0.   Received communication that it is time to renew AZ&Me assistance with [place]. Since the patient has a managed medicaid plan, will get RXs sent to [facility] on [address]. Sent request to [employer] banker, corporate). Called and spoke with the patient. Advised her to call and request the refills once she is getting low on what she currently has.   Charlann Boxer, CPhT

## 2021-09-03 ENCOUNTER — Other Ambulatory Visit: Payer: Self-pay

## 2021-09-05 ENCOUNTER — Other Ambulatory Visit (HOSPITAL_COMMUNITY): Payer: Self-pay | Admitting: *Deleted

## 2021-09-05 MED ORDER — SACUBITRIL-VALSARTAN 49-51 MG PO TABS: 1.0000 | ORAL_TABLET | Freq: Two times a day (BID) | ORAL | 3 refills | Status: DC

## 2021-09-06 ENCOUNTER — Other Ambulatory Visit (HOSPITAL_COMMUNITY): Payer: Self-pay

## 2021-09-06 ENCOUNTER — Other Ambulatory Visit: Payer: Self-pay | Admitting: Nurse Practitioner

## 2021-09-06 DIAGNOSIS — I10 Essential (primary) hypertension: Secondary | ICD-10-CM

## 2021-09-09 ENCOUNTER — Other Ambulatory Visit: Payer: Self-pay

## 2021-09-09 ENCOUNTER — Other Ambulatory Visit (HOSPITAL_COMMUNITY): Payer: Self-pay

## 2021-09-09 ENCOUNTER — Ambulatory Visit (HOSPITAL_COMMUNITY)
Admission: RE | Admit: 2021-09-09 | Discharge: 2021-09-09 | Disposition: A | Discharge status: Home / Self Care | Payer: Medicaid Other | Source: Ambulatory Visit | Attending: Nurse Practitioner | Admitting: Nurse Practitioner

## 2021-09-09 DIAGNOSIS — Z951 Presence of aortocoronary bypass graft: Secondary | ICD-10-CM | POA: Diagnosis not present

## 2021-09-09 DIAGNOSIS — T733XXA Exhaustion due to excessive exertion, initial encounter: Secondary | ICD-10-CM | POA: Insufficient documentation

## 2021-09-09 DIAGNOSIS — E785 Hyperlipidemia, unspecified: Secondary | ICD-10-CM | POA: Insufficient documentation

## 2021-09-09 DIAGNOSIS — I2510 Atherosclerotic heart disease of native coronary artery without angina pectoris: Secondary | ICD-10-CM | POA: Diagnosis not present

## 2021-09-09 DIAGNOSIS — I10 Essential (primary) hypertension: Secondary | ICD-10-CM | POA: Diagnosis not present

## 2021-09-09 DIAGNOSIS — E118 Type 2 diabetes mellitus with unspecified complications: Secondary | ICD-10-CM | POA: Insufficient documentation

## 2021-09-09 DIAGNOSIS — I214 Non-ST elevation (NSTEMI) myocardial infarction: Secondary | ICD-10-CM | POA: Insufficient documentation

## 2021-09-09 LAB — ECHOCARDIOGRAM COMPLETE
Area-P 1/2: 3.03 cm2
S' Lateral: 2.6 cm

## 2021-09-14 NOTE — Progress Notes (Signed)
Cardiology Office Note:    Date:  09/16/2021   ID:  Tiffany Le, DOB 09/05/1975, MRN [id number]  PCP:  Teena Dunk, NP   [facility] Providers Cardiologist:  Lenna Sciara, MD Referring MD: Bo Merino I, NP   Chief Complaint/Reason for Referral:  Fatigue/establish CV care  ASSESSMENT:    Fatigue, unspecified type - Plan: EKG 37-TGGY, Basic metabolic panel  S/P CABG x 4 - Plan: EKG 12-Lead, AMB referral to cardiac rehabilitation, Basic metabolic panel  Class 3 severe obesity due to excess calories with serious comorbidity and body mass index (BMI) of 40.0 to 44.9 in adult [facility])  Type 2 diabetes mellitus with complication, without long-term current use of insulin ([place]) - Plan: Basic metabolic panel  Pure hypercholesterolemia - Plan: EKG 12-Lead, Lipid panel, Hepatic function panel, AMB Referral to Heartcare Pharm-D  Snoring - Plan: Split night study    PLAN:    In order of problems listed above:  1.  I believe her fatigue is likely due to deconditioning.  We will refer Tiffany Le to cardiac rehabilitation.  Follow-up in 6 months.  2.  Continue dual antiplatelet therapy and unitl March 2023 and stop Plavix and continue aspirin indefinitely.  3.  Will refer to pharmacy for recommendations regarding pharmacotherapy.  4.  Stop Entresto as Le has normal LV function and Entresto has no benefits with diastolic dysfunction.  Start losartan 25 mg nightly, will check BMP next week.  5.  Her LDL is not at goal.  Her atorvastatin 80 had been changed to simvastatin 40 because Tiffany Le could not tolerate Tiffany Tiffany pill size of atorvastatin.  We will start Crestor 20 mg.  Her LDL should be less than 70, it was 141 in October 2022.  Tiffany Le is a diabetic and has coronary artery disease.  We will check lipid panel in 2 months and refer to pharmacy for further management.  6.  Will obtain sleep study.       Cardiac Rehabilitation Eligibility Assessment  Tiffany Le is  ready to start cardiac rehabilitation from a cardiac standpoint.          Dispo:  No follow-ups on file.     Medication Adjustments/Labs and Tests Ordered: Current medicines are reviewed at length with Tiffany Le today.  Concerns regarding medicines are outlined above.   Tests Ordered: Orders Placed This Encounter  Procedures   Lipid panel   Hepatic function panel   Basic metabolic panel   AMB Referral to Heartcare Pharm-D   AMB referral to cardiac rehabilitation   EKG 12-Lead   Split night study    Medication Changes: Meds ordered this encounter  Medications   rosuvastatin (CRESTOR) 20 MG tablet    Sig: Take 1 tablet (20 mg total) by mouth daily.    Dispense:  90 tablet    Refill:  3    D/c Simvastatin   losartan (COZAAR) 25 MG tablet    Sig: Take 1 tablet (25 mg total) by mouth at bedtime.    Dispense:  90 tablet    Refill:  3    History of Present Illness:    FOCUSED CARDIOVASCULAR PROBLEM LIST:   1.  Coronary artery disease status post CABG consisting of a LIMA to LAD, RIMA to PDA, and sequential left radial to OM1 to OM 2 with atrial appendage clipping March 6948; this was complicated by 2.  Hypertension 3.  Hyperlipidemia 4.  Type 2 diabetes c/b neuropathy 5.  History of CVA; MRI  showed remote lacunar infarct of right thalamus and subacute ischemic infarctions bilateral cerebral white matter and pons; MRA and echo negative  Tiffany Le is a 46 y.o. female with Tiffany indicated medical history here for fatigue and to establish cardiovascular care.  Tiffany Le saw her primary care provider recently.  Tiffany Le denied any chest pain or shortness of breath.  Recent lab work was unremarkable.  A TSH was drawn in October 2022 which was unremarkable.  Echocardiogram was ordered which demonstrated normal LV function with no significant valvular abnormalities and grade 2 diastolic dysfunction.    Tiffany Le tells me that Tiffany Le has had sharp shooting chest discomfort that lasts seconds.   This happens when Tiffany Le carries her work bag.  Tiffany Le has no other types of chest pain.  Tiffany Le denies any significant shortness of breath, orthopnea, paroxysmal nocturnal dyspnea, palpitations, or signs or symptoms of recurrent stroke.  Tiffany Le notes that Tiffany Le has been tired.  Tiffany Le did not participate in cardiac rehabilitation postoperatively due to have to think to do intensive physical therapy because her left arm was very weak.  Her biggest issue is with neuropathic bilateral leg pain.       Previous Medical History: Past Medical History:  Diagnosis Date   Coronary artery disease    CVA (cerebral vascular accident) ([place]) 09/2020   Diabetes mellitus without complication ([place])    Diabetic neuropathy ([place]) 02/2020   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   Hyperlipidemia age 58   Hypertension age 64   Hypocalcemia 11/2019   Hypokalemia 11/2019   Iron deficiency anemia    NSTEMI (non-ST elevated myocardial infarction) ([place]) 09/2020   Obesity    Proteinuria 12/09/2019   Pulmonary embolism ([place]) 09/2020     Current Medications: Current Meds  Medication Sig   acetaminophen (TYLENOL) 325 MG tablet Take 2 tablets (650 mg total) by mouth every 6 (six) hours as needed for mild pain or headache.   albuterol (PROVENTIL HFA) 108 (90 Base) MCG/ACT inhaler Inhale 2 puffs into Tiffany lungs every 6 (six) hours as needed for wheezing.   aspirin EC 81 MG tablet Take 81 mg by mouth daily.   carvedilol (COREG) 6.25 MG tablet Take 1 tablet (6.25 mg total) by mouth 2 (two) times daily with a meal.   clopidogrel (PLAVIX) 75 MG tablet Take 1 tablet (75 mg total) by mouth daily.   dapagliflozin propanediol (FARXIGA) 10 MG TABS tablet Take 1 tablet (10 mg total) by mouth daily.   gabapentin (NEURONTIN) 300 MG capsule Take 1 capsule (300 mg total) by mouth 2 (two) times daily.   glipiZIDE (GLUCOTROL) 10 MG tablet TAKE 1 TABLET BY MOUTH TWICE DAILY BEFORE A MEAL   insulin glargine (LANTUS SOLOSTAR) 100 UNIT/ML Solostar Pen Inject 8 Units into  Tiffany skin 2 (two) times daily.   Insulin Pen Needle 31G X 5 MM MISC Use as directed with insulin.   isosorbide-hydrALAZINE (BIDIL) 20-37.5 MG tablet Take 0.5 tablets by mouth 3 (three) times daily.   losartan (COZAAR) 25 MG tablet Take 1 tablet (25 mg total) by mouth at bedtime.   methocarbamol (ROBAXIN) 500 MG tablet Take 1 tablet (500 mg total) by mouth every 8 (eight) hours as needed for muscle spasms.   nitroGLYCERIN (NITROSTAT) 0.4 MG SL tablet DISSOLVE 1 TABLET UNDER Tiffany TONGUE EVERY 5 MINUTES FOR 3 DOSES AS NEEDED FOR CHEST PAIN   rosuvastatin (CRESTOR) 20 MG tablet Take 1 tablet (20 mg total) by mouth daily.   sacubitril-valsartan (ENTRESTO) 49-51 MG TAKE  1 TABLET BY MOUTH TWO TIMES DAILY.   spironolactone (ALDACTONE) 25 MG tablet Take 1 tablet (25 mg total) by mouth daily.   torsemide (DEMADEX) 20 MG tablet TAKE 1 TABLET(20 MG) BY MOUTH TWICE DAILY   vitamin B-12 (CYANOCOBALAMIN) 1000 MCG tablet Take 1 tablet (1,000 mcg total) by mouth once a week for 8 doses.   Vitamin D, Ergocalciferol, (DRISDOL) 1.25 MG (50000 UNIT) CAPS capsule Take 1 capsule (50,000 Units total) by mouth every 7 (seven) days for 8 doses.   [DISCONTINUED] simvastatin (ZOCOR) 40 MG tablet Take 1 tablet (40 mg total) by mouth at bedtime.     Allergies:    Penicillins   Social History:   Social History   Tobacco Use   Smoking status: Never   Smokeless tobacco: Never  Vaping Use   Vaping Use: Never used  Substance Use Topics   Alcohol use: No   Drug use: No     Family Hx: Family History  Problem Relation Age of Onset   Heart disease Mother        CABG <50   Hypertension Mother    Diabetes Mother    Hyperlipidemia Mother    Heart disease Father    Hyperlipidemia Father    Hypertension Father    Early death Father    Healthy Daughter    Diabetes Maternal Aunt    Cancer Maternal Grandmother        stomach cancer   Diabetes Maternal Aunt    Cancer Cousin        colon cancer (dx'd 72)     Review  of Systems:   Please see Tiffany history of present illness.    All other systems reviewed and are negative.     EKGs/Labs/Other Test Reviewed:    EKG: Sinus rhythm with left anterior fascicular block and anterior infarction pattern EKG from December 2022 demonstrates sinus rhythm with anteroseptal infarction pattern   Prior CV studies: TTE 1/23 1. Left ventricular ejection fraction, by estimation, is 60 to 65%. Tiffany  left ventricle has normal function. Tiffany left ventricle has no regional  wall motion abnormalities. There is mild left ventricular hypertrophy.  Left ventricular diastolic parameters  are consistent with Grade II diastolic dysfunction (pseudonormalization).  Elevated left atrial pressure.   2. Right ventricular systolic function is normal. Tiffany right ventricular  size is normal.   3. Tiffany mitral valve is normal in structure. Trivial mitral valve  regurgitation. No evidence of mitral stenosis.   4. Tiffany aortic valve is tricuspid. Aortic valve regurgitation is not  visualized. No aortic stenosis is present.   5. Tiffany inferior vena cava is normal in size with greater than 50%  respiratory variability, suggesting right atrial pressure of 3 mmHg.   Imaging studies that I have independently reviewed today: Echocardiogram  Recent Labs: 10/02/2020: B Natriuretic Peptide 947.1 10/10/2020: Magnesium 3.0 05/20/2021: NT-Pro BNP 1,091; TSH 3.000 08/20/2021: ALT 11; BUN 17; Creatinine, Ser 0.95; Hemoglobin 11.7; Platelets 319; Potassium 4.1; Sodium 141   Recent Lipid Panel Lab Results  Component Value Date/Time   CHOL 185 05/20/2021 02:22 PM   TRIG 59 05/20/2021 02:22 PM   HDL 33 (L) 05/20/2021 02:22 PM   LDLCALC 141 (H) 05/20/2021 02:22 PM    Risk Assessment/Calculations:          Physical Exam:    VS:  BP 114/70 (BP Location: Left Arm, Le Position: Sitting, Cuff Size: Large)    Pulse 78    Ht 5\' 4"  (  1.626 m)    Wt 257 lb (116.6 kg)    SpO2 97%    BMI 44.11 kg/m    Wt  Readings from Last 3 Encounters:  09/16/21 257 lb (116.6 kg)  08/20/21 249 lb (112.9 kg)  07/16/21 235 lb (106.6 kg)    GENERAL:  No apparent distress, AOx3 HEENT:  No carotid bruits, +2 carotid impulses, no scleral icterus CAR: RRR no murmurs, gallops, rubs, or thrills RES:  Clear to auscultation bilaterally ABD:  Soft, nontender, nondistended, positive bowel sounds x 4 VASC:  +2 radial pulses, +2 carotid pulses, palpable pedal pulses NEURO:  CN 2-12 grossly intact; motor and sensory grossly intact PSYCH:  No active depression or anxiety EXT:  No edema, ecchymosis, or cyanosis  Signed, Early Osmond, MD  09/16/2021 3:19 PM    [address], [place], [place]  [address] Phone: [phone]; Fax: [phone]   Note:  This document was prepared using Dragon voice recognition software and may include unintentional dictation errors.

## 2021-09-16 ENCOUNTER — Encounter: Payer: Self-pay | Admitting: Internal Medicine

## 2021-09-16 ENCOUNTER — Ambulatory Visit (INDEPENDENT_AMBULATORY_CARE_PROVIDER_SITE_OTHER): Payer: Medicaid Other | Admitting: Internal Medicine

## 2021-09-16 ENCOUNTER — Other Ambulatory Visit: Payer: Self-pay

## 2021-09-16 VITALS — BP 114/70 | HR 78 | Ht 64.0 in | Wt 257.0 lb

## 2021-09-16 DIAGNOSIS — E7800 Pure hypercholesterolemia, unspecified: Secondary | ICD-10-CM | POA: Diagnosis not present

## 2021-09-16 DIAGNOSIS — Z951 Presence of aortocoronary bypass graft: Secondary | ICD-10-CM

## 2021-09-16 DIAGNOSIS — E118 Type 2 diabetes mellitus with unspecified complications: Secondary | ICD-10-CM

## 2021-09-16 DIAGNOSIS — R5383 Other fatigue: Secondary | ICD-10-CM | POA: Diagnosis not present

## 2021-09-16 DIAGNOSIS — Z6841 Body Mass Index (BMI) 40.0 and over, adult: Secondary | ICD-10-CM

## 2021-09-16 DIAGNOSIS — R0683 Snoring: Secondary | ICD-10-CM

## 2021-09-16 MED ORDER — LOSARTAN POTASSIUM 25 MG PO TABS: 25.0000 mg | ORAL_TABLET | Freq: Every day | ORAL | 3 refills | Status: DC

## 2021-09-16 MED ORDER — ROSUVASTATIN CALCIUM 20 MG PO TABS: 20.0000 mg | ORAL_TABLET | Freq: Every day | ORAL | 3 refills | Status: DC

## 2021-09-16 NOTE — Patient Instructions (Addendum)
Medication Instructions:  1) DISCONTINUE Simvastatin 2) START Rosuvastatin 20mg  once daily 3) DISCONTINUE Entresto 4) START Losartan 25mg  once daily  *If you need a refill on your cardiac medications before your next appointment, please call your pharmacy*   Lab Work:  BMET in 1 week  Liver and Lipid in 2 months.  You will need to be fasting for these labs (nothing to eat or drink after midnight except water and black coffee).  If you have labs (blood work) drawn today and your tests are completely normal, you will receive your results only by: [place] (if you have MyChart) OR A paper copy in the mail If you have any lab test that is abnormal or we need to change your treatment, we will call you to review the results.   Testing/Procedures: Your physician has recommended that you have a sleep study. This test records several body functions during sleep, including: brain activity, eye movement, oxygen and carbon dioxide blood levels, heart rate and rhythm, breathing rate and rhythm, the flow of air through your mouth and nose, snoring, body muscle movements, and chest and belly movement.    Follow-Up:  Your physician recommends that you schedule a follow-up appointment with the [facility] shortly after your labs in 2 months.  At [facility], you and your health needs are our priority.  As part of our continuing mission to provide you with exceptional heart care, we have created designated Provider Care Teams.  These Care Teams include your primary Cardiologist (physician) and Advanced Practice Providers (APPs -  Physician Assistants and Nurse Practitioners) who all work together to provide you with the care you need, when you need it.  We recommend signing up for the patient portal called "MyChart".  Sign up information is provided on this After Visit Summary.  MyChart is used to connect with patients for Virtual Visits (Telemedicine).  Patients are able to view lab/test  results, encounter notes, upcoming appointments, etc.  Non-urgent messages can be sent to your provider as well.   To learn more about what you can do with MyChart, go to [email or web address].    Your next appointment:   6 month(s)  The format for your next appointment:   In Person  Provider:   Lenna Sciara, MD    Other Instructions  You have been referred to Cardiac Rehab.  They will contact you to get this scheduled. It can take a few weeks before they reach out.

## 2021-09-18 ENCOUNTER — Ambulatory Visit: Payer: Medicaid Other | Admitting: Internal Medicine

## 2021-09-26 ENCOUNTER — Other Ambulatory Visit: Payer: Medicaid Other

## 2021-10-11 ENCOUNTER — Other Ambulatory Visit: Payer: Self-pay

## 2021-10-14 ENCOUNTER — Telehealth: Payer: Self-pay | Admitting: Nurse Practitioner

## 2021-10-14 ENCOUNTER — Encounter: Payer: Self-pay | Admitting: Internal Medicine

## 2021-10-14 NOTE — Telephone Encounter (Signed)
Pt states that the medicine her heart doctor gave her for cholesterol is making her feet swell. ?

## 2021-10-16 ENCOUNTER — Other Ambulatory Visit: Payer: Medicaid Other

## 2021-10-18 ENCOUNTER — Other Ambulatory Visit: Payer: Medicaid Other | Admitting: *Deleted

## 2021-10-18 ENCOUNTER — Other Ambulatory Visit: Payer: Self-pay

## 2021-10-18 ENCOUNTER — Ambulatory Visit (INDEPENDENT_AMBULATORY_CARE_PROVIDER_SITE_OTHER): Payer: Medicaid Other | Admitting: Nurse Practitioner

## 2021-10-18 ENCOUNTER — Telehealth: Payer: Self-pay

## 2021-10-18 ENCOUNTER — Encounter: Payer: Self-pay | Admitting: Nurse Practitioner

## 2021-10-18 VITALS — BP 128/69 | HR 74 | Temp 97.6°F | Ht 64.0 in | Wt 266.2 lb

## 2021-10-18 DIAGNOSIS — E118 Type 2 diabetes mellitus with unspecified complications: Secondary | ICD-10-CM

## 2021-10-18 DIAGNOSIS — R609 Edema, unspecified: Secondary | ICD-10-CM

## 2021-10-18 DIAGNOSIS — Z951 Presence of aortocoronary bypass graft: Secondary | ICD-10-CM

## 2021-10-18 DIAGNOSIS — R5383 Other fatigue: Secondary | ICD-10-CM

## 2021-10-18 NOTE — Patient Instructions (Signed)
You were seen today in the [facility] for reevaluation of DM and chronic illness. Labs were collected, results will be available via MyChart or, if abnormal, you will be contacted by clinic staff. . Please follow up in 3 mths for reevaluation. Please follow up with cardiology for management of diuretics and evaluation for possible fluid retention.  ?

## 2021-10-18 NOTE — Telephone Encounter (Signed)
Spoke with pt who states she was seen by her PCP today and was referred to cardiology for management of her edema and diuretics.  Pt reports increased bilateral edema of feet and hands x 10 days as well as some new, mild SOB even at rest.  Pt states she tries to follow a low Na+ diet but does eat out. She drinks mostly water and juice.  She denies current CP or dizziness.  She is taking medications as prescribed and does not feel she is having a great deal of urine output with her diuretics.  ?Will forward to Dr Ali Lowe for further review and recommendation.  Pt verbalizes understanding and agrees with current plan.  ?

## 2021-10-18 NOTE — Progress Notes (Signed)
[place], [place]  [address] Phone:  [phone]   Fax:  [phone] Subjective:   Patient ID: Tiffany Le, female    DOB: 1976/07/26, 46 y.o.   MRN: [id number]  Chief Complaint  Patient presents with   Follow-up    Patient is here for her 3 month follow up visit and would like to discuss the swelling in both her legs x 1 week and half. Patient also states that she has no energy.   HPI Tiffany Le 46 y.o. female  has a past medical history of Coronary artery disease, CVA (cerebral vascular accident) ([place]) (09/2020), Diabetes mellitus without complication ([place]), Diabetic neuropathy ([place]) (02/2020), CABG (09/2020), Hyperlipidemia (age 52), Hypertension (age 23), Hypocalcemia (11/2019), Hypokalemia (11/2019), Iron deficiency anemia, NSTEMI (non-ST elevated myocardial infarction) ([place]) (09/2020), Obesity, Proteinuria (12/09/2019), and Pulmonary embolism ([place]) (09/2020). To the [facility] for bilateral leg swelling and fatigue.  States that she has had swelling in BLE x 1 mth since medications were prescribed by cardiologist. Denies any prolonged sitting and or standing. Swelling worsens in BLE as the day progresses. States that she is also gaining weight and often feels bloated. Has also noted swelling in the bilateral hands. Has gained 20-30 lbs in the past 2 mths, concerned for water retention. Has follow up with cardiology scheduled for today. Also continues to have consistent fatigue, states that she was informed by cardiology that she needs rehab and completion of sleep study.   Currently compliant with all medications. States that blood glucose at home 84-114. Endorses monitoring meals and exercising intermittently throughout the week. States that since last visit neuropathy has improved with increased dosage of gabapentin. 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 Denies any other concerns today.  Denies any  fever. Denies any fatigue, chest pain, shortness of breath, HA or dizziness. Denies any blurred vision, numbness or tingling.   Past Medical History:  Diagnosis Date   Coronary artery disease    CVA (cerebral vascular accident) ([place]) 09/2020   Diabetes mellitus without complication (HCC)    Diabetic neuropathy ([place]) 02/2020   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   Hyperlipidemia age 11   Hypertension age 35   Hypocalcemia 11/2019   Hypokalemia 11/2019   Iron deficiency anemia    NSTEMI (non-ST elevated myocardial infarction) ([place]) 09/2020   Obesity    Proteinuria 12/09/2019   Pulmonary embolism ([place]) 09/2020    Past Surgical History:  Procedure Laterality Date   APPLICATION OF WOUND VAC N/A 12/14/2020   Procedure: APPLICATION OF WOUND VAC;  Surgeon: Rexene Alberts, MD;  Location: [place];  Service: Thoracic;  Laterality: N/A;   APPLICATION OF WOUND VAC N/A 12/25/2020   Procedure: WOUND VAC CHANGE;  Surgeon: [name], MD;  Location: [place];  Service: Thoracic;  Laterality: N/A;   CARDIAC CATHETERIZATION  09/25/2020   CESAREAN SECTION  3/96   CLIPPING OF ATRIAL APPENDAGE N/A 10/09/2020   Procedure: CLIPPING OF ATRIAL APPENDAGE USING ATRICURE 35MM CLIP;  Surgeon: [name], MD;  Location: [place];  Service: Open Heart Surgery;  Laterality: N/A;   CORONARY ARTERY BYPASS GRAFT N/A 10/09/2020   Procedure: CORONARY ARTERY BYPASS GRAFTING (CABG), ON PUMP, TIMES FOUR, USING LEFT INTERNAL MAMMARY ARTERY AND LEFT RADIAL ARTERY (OPEN HARVEST);  Surgeon: [name], MD;  Location: [place];  Service: Open Heart Surgery;  Laterality: N/A;  POSSIBLE BIMA   INCISION AND DRAINAGE  OF WOUND N/A 12/20/2020   Procedure: STERNAL WOUND DEBRIDEMENT WITH SKIN SUBSTITUTION AND ABRA;  Surgeon: Wallace Going, DO;  Location: [place];  Service: Plastics;  Laterality: N/A;   IR THORACENTESIS ASP PLEURAL SPACE W/IMG GUIDE  10/16/2020   LEFT HEART CATH AND CORONARY ANGIOGRAPHY N/A 09/25/2020   Procedure: LEFT HEART  CATH AND CORONARY ANGIOGRAPHY;  Surgeon: Leonie Man, MD;  Location: [place] [facility];  Service: Cardiovascular;  Laterality: N/A;   ORIF FEMUR FRACTURE     RADIAL ARTERY HARVEST Left 10/09/2020   Procedure: RADIAL ARTERY HARVEST;  Surgeon: [name], MD;  Location: [place];  Service: Open Heart Surgery;  Laterality: Left;   STERNAL CLOSURE N/A 12/25/2020   Procedure: STERNAL CLOSURE;  Surgeon: [name], MD;  Location: [place] [facility];  Service: Thoracic;  Laterality: N/A;   STERNAL WOUND DEBRIDEMENT N/A 12/14/2020   Procedure: STERNAL WOUND DEBRIDEMENT;  Surgeon: Rexene Alberts, MD;  Location: [place];  Service: Thoracic;  Laterality: N/A;   STERNAL WOUND DEBRIDEMENT N/A 12/18/2020   Procedure: STERNAL WOUND DEBRIDEMENT, Wound Vac Change;  Surgeon: [name], MD;  Location: [place];  Service: Thoracic;  Laterality: N/A;   STERNAL WOUND DEBRIDEMENT N/A 12/25/2020   Procedure: STERNAL WOUND IRRIGATION AND DEBRIDEMENT;  Surgeon: [name], MD;  [facility];  Service: Thoracic;  Laterality: N/A;   TEE WITHOUT CARDIOVERSION N/A 10/09/2020   Procedure: TRANSESOPHAGEAL ECHOCARDIOGRAM (TEE);  Surgeon: [name], MD;  Location: [place];  Service: Open Heart Surgery;  Laterality: N/A;    Family History  Problem Relation Age of Onset   Heart disease Mother        CABG <50   Hypertension Mother    Diabetes Mother    Hyperlipidemia Mother    Heart disease Father    Hyperlipidemia Father    Hypertension Father    Early death Father    Healthy Daughter    Diabetes Maternal Aunt    Cancer Maternal Grandmother        stomach cancer   Diabetes Maternal Aunt    Cancer Cousin        colon cancer (dx'd 6)    Social History   Socioeconomic History   Marital status: Single    Spouse name: Not on file   Number of children: Not on file   Years of education: Not on file   Highest education level: Not on file  Occupational History   Occupation: Event organiser  Tobacco  Use   Smoking status: Never   Smokeless tobacco: Never  Vaping Use   Vaping Use: Never used  Substance and Sexual Activity   Alcohol use: No   Drug use: No   Sexual activity: Not Currently    Partners: Male    Comment: husband currently in [place], returning 02/2013  Other Topics Concern   Not on file  Social History Narrative   Works at a call center, 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  Lives with 20 year old daughter, 1 cat Husband is living in [place] (citizen there), trying to come to [place] (has been delayed)   Social Determinants of Radio broadcast assistant Strain: Not on file  Food Insecurity: Food Insecurity Present   Worried About Charity fundraiser in the Last Year: Sometimes true   Arboriculturist in the Last Year: Never true  Transportation Needs: No Data processing manager (Medical):  No   Lack of Transportation (Non-Medical): No  Physical Activity: Not on file  Stress: Not on file  Social Connections: Moderately Integrated   Frequency of Communication with Friends and Family: More than three times a week   Frequency of Social Gatherings with Friends and Family: More than three times a week   Attends Religious Services: 1 to 4 times per year   Active Member of [employer] or Organizations: Yes   Attends Archivist Meetings: 1 to 4 times per year   Marital Status: Never married  Human resources officer Violence: Not At Risk   Fear of Current or Ex-Partner: No   Emotionally Abused: No   Physically Abused: No   Sexually Abused: No    Outpatient Medications Prior to Visit  Medication Sig Dispense Refill   acetaminophen (TYLENOL) 325 MG tablet Take 2 tablets (650 mg total) by mouth every 6 (six) hours as needed for mild pain or headache.     albuterol (PROVENTIL HFA) 108 (90 Base) MCG/ACT inhaler Inhale 2 puffs into the lungs every 6 (six) hours as needed for wheezing. 18 g 12   aspirin EC 81 MG tablet Take 81 mg by mouth daily.      carvedilol (COREG) 6.25 MG tablet Take 1 tablet (6.25 mg total) by mouth 2 (two) times daily with a meal. 60 tablet 0   clopidogrel (PLAVIX) 75 MG tablet Take 1 tablet (75 mg total) by mouth daily. 30 tablet 3   dapagliflozin propanediol (FARXIGA) 10 MG TABS tablet Take 1 tablet (10 mg total) by mouth daily. 30 tablet 3   gabapentin (NEURONTIN) 300 MG capsule Take 1 capsule (300 mg total) by mouth 2 (two) times daily. 60 capsule 2   glipiZIDE (GLUCOTROL) 10 MG tablet TAKE 1 TABLET BY MOUTH TWICE DAILY BEFORE A MEAL 60 tablet 11   insulin glargine (LANTUS SOLOSTAR) 100 UNIT/ML Solostar Pen Inject 8 Units into the skin 2 (two) times daily.     Insulin Pen Needle 31G X 5 MM MISC Use as directed with insulin. 300 each 3   isosorbide-hydrALAZINE (BIDIL) 20-37.5 MG tablet Take 0.5 tablets by mouth 3 (three) times daily. 135 tablet 3   losartan (COZAAR) 25 MG tablet Take 1 tablet (25 mg total) by mouth at bedtime. 90 tablet 3   nitroGLYCERIN (NITROSTAT) 0.4 MG SL tablet DISSOLVE 1 TABLET UNDER THE TONGUE EVERY 5 MINUTES FOR 3 DOSES AS NEEDED FOR CHEST PAIN 25 tablet 7   rosuvastatin (CRESTOR) 20 MG tablet Take 1 tablet (20 mg total) by mouth daily. 90 tablet 3   spironolactone (ALDACTONE) 25 MG tablet Take 1 tablet (25 mg total) by mouth daily. 30 tablet 3   torsemide (DEMADEX) 20 MG tablet TAKE 1 TABLET(20 MG) BY MOUTH TWICE DAILY 60 tablet 3   methocarbamol (ROBAXIN) 500 MG tablet Take 1 tablet (500 mg total) by mouth every 8 (eight) hours as needed for muscle spasms. (Patient not taking: Reported on 10/18/2021) 21 tablet 0   No facility-administered medications prior to visit.    Allergies  Allergen Reactions   Penicillins Rash    Historical Has patient had a PCN reaction causing immediate rash, facial/tongue/throat swelling, SOB or lightheadedness with hypotension: No Has patient had a PCN reaction causing severe rash involving mucus membranes or skin necrosis: No Has patient had a PCN reaction  that required hospitalization: No Has patient had a PCN reaction occurring within the last 10 years: No If all of the above answers are "NO", then  may proceed with Cephalosporin use. Has tolerated cephalosporins    Review of Systems  Constitutional:  Positive for malaise/fatigue. Negative for chills and fever.  Respiratory:  Negative for cough and shortness of breath.   Cardiovascular:  Positive for leg swelling. Negative for chest pain and palpitations.  Gastrointestinal:  Negative for abdominal pain, blood in stool, constipation, diarrhea, nausea and vomiting.  Skin: Negative.   Neurological: Negative.   Psychiatric/Behavioral:  Negative for depression. The patient is not nervous/anxious.   All other systems reviewed and are negative.     Objective:    Physical Exam Vitals reviewed.  Constitutional:      General: She is not in acute distress.    Appearance: Normal appearance. She is obese.  HENT:     Head: Normocephalic.  Cardiovascular:     Rate and Rhythm: Normal rate and regular rhythm.     Pulses: Normal pulses.     Heart sounds: Normal heart sounds.     Comments: Mild to moderate peripheral edema noted to the BLE, non pitting  Pulmonary:     Effort: Pulmonary effort is normal.     Breath sounds: Normal breath sounds.  Musculoskeletal:        General: Swelling present. No deformity or signs of injury. Normal range of motion.     Right lower leg: Edema present.     Left lower leg: Edema present.  Skin:    General: Skin is warm and dry.     Capillary Refill: Capillary refill takes less than 2 seconds.  Neurological:     General: No focal deficit present.     Mental Status: She is alert and oriented to person, place, and time.  Psychiatric:        Mood and Affect: Mood normal.        Behavior: Behavior normal.        Thought Content: Thought content normal.        Judgment: Judgment normal.    BP 128/69    Pulse 74    Temp 97.6 F (36.4 C)    Ht '5\' 4"'  (1.626 m)     Wt 266 lb 3.2 oz (120.7 kg)    SpO2 100%    BMI 45.69 kg/m  Wt Readings from Last 3 Encounters:  10/18/21 266 lb 3.2 oz (120.7 kg)  09/16/21 257 lb (116.6 kg)  08/20/21 249 lb (112.9 kg)     There is no immunization history on file for this patient.  Diabetic Foot Exam - Simple   No data filed     Lab Results  Component Value Date   TSH 3.000 05/20/2021   Lab Results  Component Value Date   WBC 4.6 08/20/2021   HGB 11.7 08/20/2021   HCT 35.3 08/20/2021   MCV 84 08/20/2021   PLT 319 08/20/2021   Lab Results  Component Value Date   NA 141 08/20/2021   K 4.1 08/20/2021   CO2 24 08/20/2021   GLUCOSE 96 08/20/2021   BUN 17 08/20/2021   CREATININE 0.95 08/20/2021   BILITOT <0.2 08/20/2021   ALKPHOS 115 08/20/2021   AST 15 08/20/2021   ALT 11 08/20/2021   PROT 7.4 08/20/2021   ALBUMIN 4.0 08/20/2021   CALCIUM 9.0 08/20/2021   ANIONGAP 8 12/24/2020   EGFR 75 08/20/2021   Lab Results  Component Value Date   CHOL 185 05/20/2021   CHOL 131 09/25/2020   CHOL 165 12/09/2019   Lab Results  Component Value Date  HDL 33 (L) 05/20/2021   HDL 30 (L) 09/25/2020   HDL 31 (L) 12/09/2019   Lab Results  Component Value Date   LDLCALC 141 (H) 05/20/2021   LDLCALC 91 09/25/2020   LDLCALC 123 (H) 12/09/2019   Lab Results  Component Value Date   TRIG 59 05/20/2021   TRIG 57 10/03/2020   TRIG 1,841 (H) 10/03/2020   Lab Results  Component Value Date   CHOLHDL 5.6 (H) 05/20/2021   CHOLHDL 4.4 09/25/2020   CHOLHDL 5.3 (H) 12/09/2019   Lab Results  Component Value Date   HGBA1C 6.4 (A) 08/20/2021   HGBA1C 6.4 08/20/2021   HGBA1C 6.4 08/20/2021   HGBA1C 6.4 08/20/2021       Assessment & Plan:   Problem List Items Addressed This Visit   None Visit Diagnoses     Other fatigue    -  Primary   Peripheral edema     Informed to maintain follow up with cardiology today, may require increase in diuretics  Discussed non pharmacological methods for management of  fluid retention  Encouraged to maintain current treatment plan for evaluation and management of fatigue symptoms  Encouraged continued diet and exercise efforts  Encouraged continued compliance with medication     Follow up in 3 mths for reevaluation of DM and chronic illness, sooner as needed     I am having Ingham A. Casanas maintain her aspirin EC, nitroGLYCERIN, dapagliflozin propanediol, acetaminophen, Lantus SoloStar, spironolactone, glipiZIDE, Insulin Pen Needle, albuterol, isosorbide-hydrALAZINE, methocarbamol, carvedilol, clopidogrel, gabapentin, torsemide, rosuvastatin, and losartan.  No orders of the defined types were placed in this encounter.    Teena Dunk, NP

## 2021-10-18 NOTE — Telephone Encounter (Signed)
-----   Message from Judie Grieve sent at 10/18/2021 11:47 AM EST ----- ?Regarding: Patient has swelling/ primary wanted to speak about medication ?Patient experiencing swelling in legs, primary care wanted them to speak with someone here regarding medications. ? ?I confirmed that mobile number would be best contact. ? ?Thank you! ? ? ?

## 2021-10-18 NOTE — Telephone Encounter (Signed)
Spoke with pt and appointment scheduled for 10/22/2021 at 11am.  Pt verbalizes understanding and agrees with current plan. ?

## 2021-10-19 LAB — BASIC METABOLIC PANEL
BUN/Creatinine Ratio: 18 (ref 9–23)
BUN: 20 mg/dL (ref 6–24)
CO2: 22 mmol/L (ref 20–29)
Calcium: 9.2 mg/dL (ref 8.7–10.2)
Chloride: 102 mmol/L (ref 96–106)
Creatinine, Ser: 1.11 mg/dL — ABNORMAL HIGH (ref 0.57–1.00)
Glucose: 86 mg/dL (ref 70–99)
Potassium: 4.2 mmol/L (ref 3.5–5.2)
Sodium: 142 mmol/L (ref 134–144)
eGFR: 62 mL/min/{1.73_m2} (ref >59)

## 2021-10-20 NOTE — Progress Notes (Unsigned)
Cardiology Office Note:    Date:  10/20/2021   ID:  Tiffany Le, DOB 11/13/1975, MRN [id number]  PCP:  Teena Dunk, NP   [facility] Providers Cardiologist:  Lenna Sciara, MD Referring MD: Bo Merino I, NP   Chief Complaint/Reason for Referral:  Dyspnea/edema  ASSESSMENT:    Dyspnea, unspecified type  Type 2 diabetes mellitus with complication, without long-term current use of insulin ([place])  Hypertension associated with diabetes ([place])  Hyperlipidemia associated with type 2 diabetes mellitus ([place])  Class 3 severe obesity due to excess calories with serious comorbidity and body mass index (BMI) of 40.0 to 44.9 in adult [facility])  Cerebrovascular accident (CVA), unspecified mechanism ([place])  S/P CABG x 4    PLAN:    In order of problems listed above: 1.  Likely multifactorial.  Currently on torsemide.  Will refer for right heart catheterization to evaluate further.  Follow-up.... 2.  Continue [name].  Increase dose today.  Continue statin, losartan, and aspirin. 3.  Stop spironolactone and increase losartan.  We will stop Coreg as EF is normal. 4.  Has lipid panel to be drawn in the future.  Goal LDL is less than 70. 5.  Conditions regarding obesity. 6.  We will obtain bubble study echocardiogram to evaluate further. 7.  Stop Plavix now and continue aspirin indefinitely.      {The patient has an active order for outpatient cardiac rehabilitation.   Please indicate if the patient is ready to start. Do NOT delete this.  It will auto delete.  Refresh note, then sign.              Click here to document readiness and see contraindications.  :1}  Cardiac Rehabilitation Eligibility Assessment            Dispo:  No follow-ups on file.     Medication Adjustments/Labs and Tests Ordered: Current medicines are reviewed at length with the patient today.  Concerns regarding medicines are outlined above.   Tests Ordered: No orders of the defined types  were placed in this encounter.   Medication Changes: No orders of the defined types were placed in this encounter.   History of Present Illness:    FOCUSED CARDIOVASCULAR PROBLEM LIST:   1.  Coronary artery disease status post CABG consisting of a LIMA to LAD, RIMA to PDA, and sequential left radial to OM1 to OM 2 with atrial appendage clipping March 7544; this was complicated by sternal wound dehiscence requiring debridement 2.  Hypertension 3.  Hyperlipidemia 4.  Type 2 diabetes c/b neuropathy 5.  History of CVA; MRI showed remote lacunar infarct of right thalamus and subacute ischemic infarctions bilateral cerebral white matter and pons; MRA and echo negative  February 2023: Tiffany Le was stopped due to normal LV function and losartan was started due to history of diabetes.  Crestor was started and patient was referred to cardiac rehabilitation as well as for sleep study.  Current visit: The patient returns for expedited follow-up due to increasing shortness of breath.       Previous Medical History: Past Medical History:  Diagnosis Date   Coronary artery disease    CVA (cerebral vascular accident) ([place]) 09/2020   Diabetes mellitus without complication (HCC)    Diabetic neuropathy ([place]) 02/2020   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   Hyperlipidemia age 73   Hypertension age 52   Hypocalcemia 11/2019   Hypokalemia 11/2019   Iron deficiency anemia    NSTEMI (non-ST elevated myocardial infarction) (  [place]) 09/2020   Obesity    Proteinuria 12/09/2019   Pulmonary embolism ([place]) 09/2020     Current Medications: No outpatient medications have been marked as taking for the 10/22/21 encounter (Appointment) with Early Osmond, MD.     Allergies:    Penicillins   Social History:   Social History   Tobacco Use   Smoking status: Never   Smokeless tobacco: Never  Vaping Use   Vaping Use: Never used  Substance Use Topics   Alcohol use: No   Drug use: No     Family Hx: Family History   Problem Relation Age of Onset   Heart disease Mother        CABG <50   Hypertension Mother    Diabetes Mother    Hyperlipidemia Mother    Heart disease Father    Hyperlipidemia Father    Hypertension Father    Early death Father    Healthy Daughter    Diabetes Maternal Aunt    Cancer Maternal Grandmother        stomach cancer   Diabetes Maternal Aunt    Cancer Cousin        colon cancer (dx'd 40)     Review of Systems:   Please see the history of present illness.    All other systems reviewed and are negative.     EKGs/Labs/Other Test Reviewed:    EKG: Sinus rhythm with left anterior fascicular block and anterior infarction pattern EKG from December 2022 demonstrates sinus rhythm with anteroseptal infarction pattern   Prior CV studies: TTE 1/23 1. Left ventricular ejection fraction, by estimation, is 60 to 65%. The  left ventricle has normal function. The left ventricle has no regional  wall motion abnormalities. There is mild left ventricular hypertrophy.  Left ventricular diastolic parameters  are consistent with Grade II diastolic dysfunction (pseudonormalization).  Elevated left atrial pressure.   2. Right ventricular systolic function is normal. The right ventricular  size is normal.   3. The mitral valve is normal in structure. Trivial mitral valve  regurgitation. No evidence of mitral stenosis.   4. The aortic valve is tricuspid. Aortic valve regurgitation is not  visualized. No aortic stenosis is present.   5. The inferior vena cava is normal in size with greater than 50%  respiratory variability, suggesting right atrial pressure of 3 mmHg.   Imaging studies that I have independently reviewed today: Echocardiogram  Recent Labs: 05/20/2021: NT-Pro BNP 1,091; TSH 3.000 08/20/2021: ALT 11; Hemoglobin 11.7; Platelets 319 10/18/2021: BUN 20; Creatinine, Ser 1.11; Potassium 4.2; Sodium 142   Recent Lipid Panel Lab Results  Component Value Date/Time   CHOL  185 05/20/2021 02:22 PM   TRIG 59 05/20/2021 02:22 PM   HDL 33 (L) 05/20/2021 02:22 PM   LDLCALC 141 (H) 05/20/2021 02:22 PM    Risk Assessment/Calculations:          Physical Exam:    VS:  There were no vitals taken for this visit.   Wt Readings from Last 3 Encounters:  10/18/21 266 lb 3.2 oz (120.7 kg)  09/16/21 257 lb (116.6 kg)  08/20/21 249 lb (112.9 kg)    GENERAL:  No apparent distress, AOx3 HEENT:  No carotid bruits, +2 carotid impulses, no scleral icterus CAR: RRR no murmurs, gallops, rubs, or thrills RES:  Clear to auscultation bilaterally ABD:  Soft, nontender, nondistended, positive bowel sounds x 4 VASC:  +2 radial pulses, +2 carotid pulses, palpable pedal pulses NEURO:  CN  2-12 grossly intact; motor and sensory grossly intact PSYCH:  No active depression or anxiety EXT:  No edema, ecchymosis, or cyanosis  Signed, Early Osmond, MD  10/20/2021 8:22 AM    [facility] [place], [place], [place]  [address] Phone: [phone]; Fax: [phone]   Note:  This document was prepared using Dragon voice recognition software and may include unintentional dictation errors.

## 2021-10-22 ENCOUNTER — Encounter: Payer: Self-pay | Admitting: Internal Medicine

## 2021-10-22 ENCOUNTER — Ambulatory Visit (INDEPENDENT_AMBULATORY_CARE_PROVIDER_SITE_OTHER): Payer: Medicaid Other | Admitting: Internal Medicine

## 2021-10-22 ENCOUNTER — Other Ambulatory Visit: Payer: Self-pay

## 2021-10-22 VITALS — BP 126/70 | HR 78 | Ht 64.0 in | Wt 262.0 lb

## 2021-10-22 DIAGNOSIS — E1169 Type 2 diabetes mellitus with other specified complication: Secondary | ICD-10-CM | POA: Diagnosis not present

## 2021-10-22 DIAGNOSIS — I152 Hypertension secondary to endocrine disorders: Secondary | ICD-10-CM

## 2021-10-22 DIAGNOSIS — Z6841 Body Mass Index (BMI) 40.0 and over, adult: Secondary | ICD-10-CM

## 2021-10-22 DIAGNOSIS — E1159 Type 2 diabetes mellitus with other circulatory complications: Secondary | ICD-10-CM | POA: Diagnosis not present

## 2021-10-22 DIAGNOSIS — I639 Cerebral infarction, unspecified: Secondary | ICD-10-CM

## 2021-10-22 DIAGNOSIS — R0600 Dyspnea, unspecified: Secondary | ICD-10-CM

## 2021-10-22 DIAGNOSIS — Z951 Presence of aortocoronary bypass graft: Secondary | ICD-10-CM

## 2021-10-22 DIAGNOSIS — E785 Hyperlipidemia, unspecified: Secondary | ICD-10-CM

## 2021-10-22 DIAGNOSIS — E118 Type 2 diabetes mellitus with unspecified complications: Secondary | ICD-10-CM | POA: Diagnosis not present

## 2021-10-22 MED ORDER — LOSARTAN POTASSIUM 50 MG PO TABS: 50.0000 mg | ORAL_TABLET | Freq: Every day | ORAL | 3 refills | Status: DC

## 2021-10-22 NOTE — Addendum Note (Signed)
Addended by: Mendel Ryder on: 10/22/2021 11:42 AM ? ? Modules accepted: Orders ? ?

## 2021-10-22 NOTE — Patient Instructions (Signed)
Medication Instructions:  ?1) Stop Carvedilol  ? ?2) Stop Plavix  ? ?3) Stop Spironolactone  ? ?4) Stop Bidil ? ?5) Increase Losartan to 50 mg daily  ? ?*If you need a refill on your cardiac medications before your next appointment, please call your pharmacy* ? ? ?Lab Work: ?Your physician recommends that you return for a FASTING lipid profile and comprehensive metabolic panel next week.  ? ?If you have labs (blood work) drawn today and your tests are completely normal, you will receive your results only by: ?MyChart Message (if you have MyChart) OR ?A paper copy in the mail ?If you have any lab test that is abnormal or we need to change your treatment, we will call you to review the results. ? ? ?Testing/Procedures: ?Your physician has requested that you have an echocardiogram bubble study. Echocardiography is a painless test that uses sound waves to create images of your heart. It provides your doctor with information about the size and shape of your heart and how well your heart?s chambers and valves are working. This procedure takes approximately one hour. There are no restrictions for this procedure. ? ? ?Your physician has referred you to see our pharmacist  ? ? ?Follow-Up: ?At [facility], you and your health needs are our priority.  As part of our continuing mission to provide you with exceptional heart care, we have created designated Provider Care Teams.  These Care Teams include your primary Cardiologist (physician) and Advanced Practice Providers (APPs -  Physician Assistants and Nurse Practitioners) who all work together to provide you with the care you need, when you need it. ? ?We recommend signing up for the patient portal called "MyChart".  Sign up information is provided on this After Visit Summary.  MyChart is used to connect with patients for Virtual Visits (Telemedicine).  Patients are able to view lab/test results, encounter notes, upcoming appointments, etc.  Non-urgent messages can be sent  to your provider as well.   ?To learn more about what you can do with MyChart, go to [email or web address].   ? ?Your next appointment:   ?6 month(s) ? ?The format for your next appointment:   ?In Person ? ?Provider:   ?Dr. Lenna Sciara  ? ? ?Other Instructions ?None   ?

## 2021-10-23 ENCOUNTER — Ambulatory Visit: Payer: Medicaid Other | Admitting: Podiatry

## 2021-10-24 ENCOUNTER — Other Ambulatory Visit: Payer: Self-pay | Admitting: Nurse Practitioner

## 2021-10-25 ENCOUNTER — Other Ambulatory Visit (HOSPITAL_COMMUNITY): Payer: Self-pay

## 2021-10-25 MED ORDER — DAPAGLIFLOZIN PROPANEDIOL 10 MG PO TABS: 10.0000 mg | ORAL_TABLET | Freq: Every day | ORAL | 0 refills | Status: DC

## 2021-10-28 ENCOUNTER — Other Ambulatory Visit: Payer: Medicaid Other

## 2021-10-29 ENCOUNTER — Other Ambulatory Visit: Payer: Self-pay

## 2021-10-29 ENCOUNTER — Other Ambulatory Visit: Payer: Medicaid Other | Admitting: *Deleted

## 2021-10-29 DIAGNOSIS — E1169 Type 2 diabetes mellitus with other specified complication: Secondary | ICD-10-CM

## 2021-10-29 LAB — LIPID PANEL
Chol/HDL Ratio: 3.3 ratio (ref 0.0–4.4)
Cholesterol, Total: 140 mg/dL (ref 100–199)
HDL: 42 mg/dL (ref >39)
LDL Chol Calc (NIH): 86 mg/dL (ref 0–99)
Triglycerides: 58 mg/dL (ref 0–149)
VLDL Cholesterol Cal: 12 mg/dL (ref 5–40)

## 2021-10-30 ENCOUNTER — Telehealth (HOSPITAL_COMMUNITY): Payer: Self-pay

## 2021-10-30 ENCOUNTER — Encounter (HOSPITAL_COMMUNITY): Payer: Self-pay

## 2021-10-30 NOTE — Telephone Encounter (Signed)
Attempted to call patient in regards to Cardiac Rehab - LM on VM Mailed letter 

## 2021-10-30 NOTE — Telephone Encounter (Signed)
Pt insurance is active and benefits verified through [facility]. Co-pay $4.00, DED $0.00/$0.00 met, out of pocket $0.00/$0.00 met, co-insurance 0%. No pre-authorization required. [place] [employer], 10/30/21 @ 2:59PM, CKI#[id number] ?  ?Will contact patient to see if she is interested in the Cardiac Rehab Program.  ?

## 2021-10-30 NOTE — Telephone Encounter (Signed)
Pt returned CR phone call and stated she is interested in CR. Patient will come in for orientation on 11/28/21 @ 1030AM and will attend the 1030AM exercise class. Went over insurance, patient verbalized understanding.  ?  ?Tourist information centre manager.  ?

## 2021-11-04 ENCOUNTER — Ambulatory Visit (INDEPENDENT_AMBULATORY_CARE_PROVIDER_SITE_OTHER): Payer: Medicaid Other

## 2021-11-04 ENCOUNTER — Ambulatory Visit (INDEPENDENT_AMBULATORY_CARE_PROVIDER_SITE_OTHER): Payer: Medicaid Other | Admitting: Podiatry

## 2021-11-04 ENCOUNTER — Encounter: Payer: Self-pay | Admitting: Podiatry

## 2021-11-04 ENCOUNTER — Other Ambulatory Visit: Payer: Self-pay

## 2021-11-04 DIAGNOSIS — M779 Enthesopathy, unspecified: Secondary | ICD-10-CM | POA: Diagnosis not present

## 2021-11-04 DIAGNOSIS — G629 Polyneuropathy, unspecified: Secondary | ICD-10-CM | POA: Diagnosis not present

## 2021-11-04 DIAGNOSIS — M7751 Other enthesopathy of right foot: Secondary | ICD-10-CM

## 2021-11-04 MED ORDER — TRIAMCINOLONE ACETONIDE 10 MG/ML IJ SUSP
10.0000 mg | Freq: Once | INTRAMUSCULAR | Status: AC
  Administered 2021-11-04: 10 mg

## 2021-11-04 NOTE — Progress Notes (Signed)
Subjective:  ? ?Patient ID: Tiffany Le, female   DOB: 46 y.o.   MRN: [id number]  ? ?HPI ?Patient states she has had numbness in her toes of both feet and has developed some discomfort in her right ankle and foot over the left and states that gradually has become more of an issue.  Patient also had open heart surgery does have obesity is complicating factor.  Patient does not smoke would like to be active ? ? ?Review of Systems  ?All other systems reviewed and are negative. ? ? ?   ?Objective:  ?Physical Exam ?Vitals and nursing note reviewed.  ?Constitutional:   ?   Appearance: She is well-developed.  ?Pulmonary:  ?   Effort: Pulmonary effort is normal.  ?Musculoskeletal:     ?   General: Normal range of motion.  ?Skin: ?   General: Skin is warm.  ?Neurological:  ?   Mental Status: She is alert.  ?  ?Neurovascular status was found to be moderately reduced sharp dull vibratory with circulatory status intact.  Patient does have significant obesity does have heart issues and does have neuropathic-like symptomatology that she takes gabapentin for.  Pain in the right over left ankle noted ? ?   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 ?   ?Plan:  ?H&P x-rays removed sterile prep did inject the right ankle 3 mg Dexasone Kenalog 5 mg Xylocaine advised on reduced activity and if symptoms are helpful we could consider the left in future.  Reappoint to recheck as indicated or needed ? ?X-rays indicate no signs of advanced arthritis stress fracture with spur formation noted ?   ? ? ?

## 2021-11-05 ENCOUNTER — Ambulatory Visit (HOSPITAL_COMMUNITY): Payer: Medicaid Other | Attending: Internal Medicine

## 2021-11-05 ENCOUNTER — Other Ambulatory Visit: Payer: Self-pay | Admitting: Podiatry

## 2021-11-05 DIAGNOSIS — M779 Enthesopathy, unspecified: Secondary | ICD-10-CM

## 2021-11-07 ENCOUNTER — Ambulatory Visit (HOSPITAL_COMMUNITY): Payer: No Typology Code available for payment source

## 2021-11-08 ENCOUNTER — Telehealth: Payer: Self-pay

## 2021-11-08 ENCOUNTER — Telehealth: Payer: Self-pay | Admitting: *Deleted

## 2021-11-08 DIAGNOSIS — Z79899 Other long term (current) drug therapy: Secondary | ICD-10-CM

## 2021-11-08 DIAGNOSIS — R0683 Snoring: Secondary | ICD-10-CM

## 2021-11-08 DIAGNOSIS — R5383 Other fatigue: Secondary | ICD-10-CM

## 2021-11-08 MED ORDER — ROSUVASTATIN CALCIUM 40 MG PO TABS: 40.0000 mg | ORAL_TABLET | Freq: Every day | ORAL | 3 refills | Status: DC

## 2021-11-08 NOTE — Telephone Encounter (Signed)
Prior Authorization for SPLIT NIGHT sent to [facility] via Phone.  ?Reference # [id number].  ?

## 2021-11-08 NOTE — Telephone Encounter (Signed)
-----   Message from Early Osmond, MD sent at 10/30/2021  7:45 AM EDT ----- ?LDL too high, let's increase cresto to 40 and check repeat FLP and LFTs on day of next visit. ?

## 2021-11-11 ENCOUNTER — Other Ambulatory Visit: Payer: Medicaid Other

## 2021-11-11 NOTE — Telephone Encounter (Signed)
4/3 APPROVED TURNER TO READ ?11/08/21---02/06/22 ?3/31  CASE# [id number]. ?Earney Mallet TO [facility] ?[phone] ?

## 2021-11-14 NOTE — Progress Notes (Deleted)
Patient ID: Tiffany Le                 DOB: 02-12-1976                    MRN: [id number] ? ? ? ? ?HPI: ?Tiffany Le is a 46 y.o. female patient referred to lipid clinic by Dr. Ali Lowe. 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  ? ?Patient referred to Dr. Ali Lowe to establish CV care in Feb 2023 at which time her LDL of 141 (05/2021) was above goal on simvastatin 40 mg daily. She had previously been taking atorvastatin 80 mg but was changed to simvastatin 40 mg because she couldn't tolerate the pill size of atorvastatin. Patient started rosuvastatin 20 mg and followed up with Dr. Ali Lowe in March 2023 where her LDL was still above goal (86) and statin therapy was optimized to rosuvastatin 40 mg daily. ? ?Current Medications:  ?Rosuvastatin 40 mg (increased 10/30/2021) ? ?Intolerances: none ? ?Risk Factors: ASCVD, HTN, DM, FHx of premature CAD ?LDL goal: <70 mg/dL ? ?Diet: *** ? ?Exercise: *** ? ?Family History:  ?Mother - heart disease, CABG <50yo, HTN, DM, HLD ?Father - heart disease, HLD, HTN, early death ?Maternal Aunt x2 - DM ? ?Social History: no tobacco, alcohol, or drug use reported ? ?Labs: ?05/20/21 - LDL 141, HDL 33, TC 185, TG 59 ?09/16/21 - AST 15, ALT 11 ?08/20/21 - LDL 86, HDL 42, TC 140, TG 58 ? ?Past Medical History:  ?Diagnosis Date  ? Coronary artery disease   ? CVA (cerebral vascular accident) ([place]) 09/2020  ? Diabetes mellitus without complication ([place])   ? Diabetic neuropathy ([place]) 02/2020  ? 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 Hyperlipidemia age 27  ? Hypertension age 68  ? Hypocalcemia 11/2019  ? Hypokalemia 11/2019  ? Iron deficiency anemia   ? NSTEMI (non-ST elevated myocardial infarction) ([place]) 09/2020  ? Obesity   ? Proteinuria 12/09/2019  ? Pulmonary embolism ([place]) 09/2020  ? ? ?Current Outpatient Medications on File Prior to Visit  ?Medication Sig Dispense Refill  ? acetaminophen (TYLENOL) 325 MG tablet Take 2 tablets (650 mg total) by mouth  every 6 (six) hours as needed for mild pain or headache.    ? albuterol (PROVENTIL HFA) 108 (90 Base) MCG/ACT inhaler Inhale 2 puffs into the lungs every 6 (six) hours as needed for wheezing. 18 g 12  ? aspirin EC 81 MG tablet Take 81 mg by mouth daily.    ? dapagliflozin propanediol (FARXIGA) 10 MG TABS tablet Take 1 tablet (10 mg total) by mouth daily. Need appointment for further refills 90 tablet 0  ? gabapentin (NEURONTIN) 300 MG capsule Take 1 capsule (300 mg total) by mouth 2 (two) times daily. 60 capsule 2  ? glipiZIDE (GLUCOTROL) 10 MG tablet TAKE 1 TABLET BY MOUTH TWICE DAILY BEFORE A MEAL 60 tablet 11  ? insulin glargine (LANTUS SOLOSTAR) 100 UNIT/ML Solostar Pen Inject 8 Units into the skin 2 (two) times daily.    ? Insulin Pen Needle 31G X 5 MM MISC Use as directed with insulin. 300 each 3  ? methocarbamol (ROBAXIN) 500 MG tablet Take 1 tablet (500 mg total) by mouth every 8 (eight) hours as needed for muscle spasms. 21 tablet 0  ? nitroGLYCERIN (NITROSTAT) 0.4 MG SL tablet DISSOLVE 1 TABLET UNDER THE TONGUE EVERY 5 MINUTES FOR 3 DOSES AS NEEDED FOR CHEST PAIN 25 tablet 7  ?  rosuvastatin (CRESTOR) 40 MG tablet Take 1 tablet (40 mg total) by mouth daily. 90 tablet 3  ? spironolactone (ALDACTONE) 25 MG tablet Take 25 mg by mouth daily.    ? torsemide (DEMADEX) 20 MG tablet TAKE 1 TABLET(20 MG) BY MOUTH TWICE DAILY 60 tablet 3  ? Vitamin D, Ergocalciferol, (DRISDOL) 1.25 MG (50000 UNIT) CAPS capsule TAKE 1 CAPSULE BY MOUTH EVERY 7 DAYS FOR 8 DOSES 8 capsule 0  ? ?No current facility-administered medications on file prior to visit.  ? ? ?Allergies  ?Allergen Reactions  ? Penicillins Rash  ?  Historical ?Has patient had a PCN reaction causing immediate rash, facial/tongue/throat swelling, SOB or lightheadedness with hypotension: No ?Has patient had a PCN reaction causing severe rash involving mucus membranes or skin necrosis: No ?Has patient had a PCN reaction that required hospitalization: No ?Has patient  had a PCN reaction occurring within the last 10 years: No ?If all of the above answers are "NO", then may proceed with Cephalosporin use. ?Has tolerated cephalosporins  ? ? ?Assessment/Plan: ? ?1. Hyperlipidemia -  ? ?Pending repeat lipid panel... add zetia? ? ?Thank you, ? ? ?Laurey Arrow, PharmD ?PGY1 Pharmacy Resident ?[id number] [place], [place] [address]  ?Phone: [phone]; Fax: [phone]  ?

## 2021-11-14 NOTE — Progress Notes (Addendum)
Patient ID: [name]                 DOB: 24-Feb-1976                    MRN: [id number] ? ? ? ? ?HPI: ?Tiffany Le is a 46 y.o. female patient referred to PharmD clinic by Dr Ali Lowe for lipid and weight loss management. PMH is significant for  NSTEMI 09/2020 with CABG x4v, CVA and PE,  CHF, T2DM, HTN, HLD, and obesity. ? ?Pt presents today in good spirits. Reports tolerating her medications well. Still has spironolactone on her med list although this was stopped at last cards visit. Also had losartan removed from her med list by CMA at podiatry office although she is still taking this as prescribed. Med list has been updated. Starts cardiac rehab on April 20th. Has struggled with ~30 lb weight gain since January. Had been drinking 3-4 regular Cokes each day then, now down to 1 small can every other day. Has had some constipation recently. ? ?Current Medications: rosuvastatin '40mg'$  daily ?Risk Factors: premature ASCVD,CVA, DM, HTN, FHx premature CAD ?LDL goal: '55mg'$ /dL ? ?Diet: Apple in the AM. Vending machine snacks, trying to cut back. Had gained 30 lbs since January, was drinking a lot of regular Coke, 3-4 cans a day. Now down to 1 small can every other day. ? ?Exercise: starting cardiac rehab on April 20th ? ?Family History: Mother with CABG < age 83, DM, HTN, and HLD. Father with CAD, HLD, HTN, and early death. ? ?Social History: Denies tobacco, alcohol and drug use. ? ?Labs: ?10/29/21: TC 140, TG 58, HDL 42, LDL 86 (rosuvastatin '20mg'$  daily) ?08/20/21: A1c 6.4% ? ?Past Medical History:  ?Diagnosis Date  ? Coronary artery disease   ? CVA (cerebral vascular accident) ([place]) 09/2020  ? Diabetes mellitus without complication ([place])   ? Diabetic neuropathy ([place]) 02/2020  ? 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 Hyperlipidemia age 53  ? Hypertension age 37  ? Hypocalcemia 11/2019  ? Hypokalemia 11/2019  ? Iron deficiency anemia   ? NSTEMI (non-ST elevated myocardial infarction) ([place]) 09/2020  ? Obesity   ? Proteinuria  12/09/2019  ? Pulmonary embolism ([place]) 09/2020  ? ? ?Current Outpatient Medications on File Prior to Visit  ?Medication Sig Dispense Refill  ? acetaminophen (TYLENOL) 325 MG tablet Take 2 tablets (650 mg total) by mouth every 6 (six) hours as needed for mild pain or headache.    ? albuterol (PROVENTIL HFA) 108 (90 Base) MCG/ACT inhaler Inhale 2 puffs into the lungs every 6 (six) hours as needed for wheezing. 18 g 12  ? aspirin EC 81 MG tablet Take 81 mg by mouth daily.    ? dapagliflozin propanediol (FARXIGA) 10 MG TABS tablet Take 1 tablet (10 mg total) by mouth daily. Need appointment for further refills 90 tablet 0  ? gabapentin (NEURONTIN) 300 MG capsule Take 1 capsule (300 mg total) by mouth 2 (two) times daily. 60 capsule 2  ? glipiZIDE (GLUCOTROL) 10 MG tablet TAKE 1 TABLET BY MOUTH TWICE DAILY BEFORE A MEAL 60 tablet 11  ? insulin glargine (LANTUS SOLOSTAR) 100 UNIT/ML Solostar Pen Inject 8 Units into the skin 2 (two) times daily.    ? Insulin Pen Needle 31G X 5 MM MISC Use as directed with insulin. 300 each 3  ? methocarbamol (ROBAXIN) 500 MG tablet Take 1 tablet (500 mg total) by mouth every 8 (eight) hours as needed for muscle  spasms. 21 tablet 0  ? nitroGLYCERIN (NITROSTAT) 0.4 MG SL tablet DISSOLVE 1 TABLET UNDER THE TONGUE EVERY 5 MINUTES FOR 3 DOSES AS NEEDED FOR CHEST PAIN 25 tablet 7  ? rosuvastatin (CRESTOR) 40 MG tablet Take 1 tablet (40 mg total) by mouth daily. 90 tablet 3  ? spironolactone (ALDACTONE) 25 MG tablet Take 25 mg by mouth daily.    ? torsemide (DEMADEX) 20 MG tablet TAKE 1 TABLET(20 MG) BY MOUTH TWICE DAILY 60 tablet 3  ? Vitamin D, Ergocalciferol, (DRISDOL) 1.25 MG (50000 UNIT) CAPS capsule TAKE 1 CAPSULE BY MOUTH EVERY 7 DAYS FOR 8 DOSES 8 capsule 0  ? ?No current facility-administered medications on file prior to visit.  ? ? ?Allergies  ?Allergen Reactions  ? Penicillins Rash  ?  Historical ?Has patient had a PCN reaction causing immediate rash, facial/tongue/throat swelling, SOB  or lightheadedness with hypotension: No ?Has patient had a PCN reaction causing severe rash involving mucus membranes or skin necrosis: No ?Has patient had a PCN reaction that required hospitalization: No ?Has patient had a PCN reaction occurring within the last 10 years: No ?If all of the above answers are "NO", then may proceed with Cephalosporin use. ?Has tolerated cephalosporins  ? ? ?Assessment/Plan: ? ?1. Hyperlipidemia - LDL 86 on rosuvastatin '20mg'$  daily, above goal < 55 given premature ASCVD, CVA, and DM. Dose has since been increased to '40mg'$  daily which typically lowers LDL an additional 6%. Will also add on ezetimibe '10mg'$  daily to target LDL goal. Her insurance does not cover Nexlizet. Also starts cardiac rehab in 2 weeks. Rechecking fasting lipids at Sept appt with MD. ? ?2. DM/weight loss - A1c well controlled at 6.4% on Lantus 8u BID, glipizide '10mg'$  BID, and Farxiga '10mg'$  daily. Will stop glipizide due to lack of CV benefit and potential to contribute to weight gain. Will instead start Ozempic 0.'25mg'$  weekly for better CV benefit and weight loss. Will call pt in 1 month for further dose titration. She'll continue to monitor glucose at home and call if she starts noticing lows - can decrease insulin if needed. Also discussed changing from regular to diet Coke with ultimate goal of minimizing soda intake as much as possible. Discussed eating smaller, more frequent portions throughout the day. ? ?[name] E. Ocie Tino, PharmD, BCACP, CPP ?[id number], [place], [place] [address] ?Phone: [phone]; Fax: [phone] ?11/15/2021 11:26 AM ? ? ? ?

## 2021-11-15 ENCOUNTER — Ambulatory Visit (INDEPENDENT_AMBULATORY_CARE_PROVIDER_SITE_OTHER): Payer: Medicaid Other | Admitting: Pharmacist

## 2021-11-15 VITALS — Wt 263.5 lb

## 2021-11-15 DIAGNOSIS — Z6841 Body Mass Index (BMI) 40.0 and over, adult: Secondary | ICD-10-CM | POA: Diagnosis not present

## 2021-11-15 DIAGNOSIS — E782 Mixed hyperlipidemia: Secondary | ICD-10-CM | POA: Diagnosis not present

## 2021-11-15 DIAGNOSIS — E118 Type 2 diabetes mellitus with unspecified complications: Secondary | ICD-10-CM

## 2021-11-15 DIAGNOSIS — Z794 Long term (current) use of insulin: Secondary | ICD-10-CM

## 2021-11-15 DIAGNOSIS — E785 Hyperlipidemia, unspecified: Secondary | ICD-10-CM | POA: Insufficient documentation

## 2021-11-15 MED ORDER — OZEMPIC (0.25 OR 0.5 MG/DOSE) 2 MG/3ML ~~LOC~~ SOPN: PEN_INJECTOR | SUBCUTANEOUS | 1 refills | Status: DC

## 2021-11-15 MED ORDER — NITROGLYCERIN 0.4 MG SL SUBL: SUBLINGUAL_TABLET | SUBLINGUAL | 5 refills | Status: DC

## 2021-11-15 MED ORDER — DAPAGLIFLOZIN PROPANEDIOL 10 MG PO TABS: 10.0000 mg | ORAL_TABLET | Freq: Every day | ORAL | 3 refills | Status: DC

## 2021-11-15 MED ORDER — EZETIMIBE 10 MG PO TABS: 10.0000 mg | ORAL_TABLET | Freq: Every day | ORAL | 3 refills | Status: DC

## 2021-11-15 NOTE — Patient Instructions (Addendum)
Your LDL cholesterol is 86 and your goal is < 55 ?-Continue taking rosuvastatin '40mg'$  daily ?-Start taking Zetia (ezetimibe) '10mg'$  once daily ?-Recheck fasting labs when you see Dr Ali Lowe in September ? ?Your A1c is well controlled at 6.4% ?-Stop taking glipizide ?-Start taking Ozempic 0.'25mg'$  once weekly ? ?Ozempic Counseling Points ?This medication reduces your appetite and may make you feel fuller longer.  ?Stop eating when your body tells you that you are full. This will likely happen sooner than you are used to. ?Store your medication in the fridge until you are ready to use it. ?Inject your medication in the fatty tissue of your lower abdominal area (2 inches away from belly button) or upper outer thigh. Rotate injection sites. ?Each pen will last you about 1 month (the first month it will last a few weeks longer). Use a different needle with each weekly injection. ?Common side effects include: nausea, diarrhea/constipation, and heartburn, and are more likely to occur if you overeat. ? ?Dosing schedule: ?- Month 1: Inject 0.25 subcutaneously once weekly for 4 weeks ?- Month 2: Inject 0.5 subcutaneously once weekly for 6 weeks ?- Month 3: Inject 1 subcutaneously once weekly for 4 weeks ?- Month 4: Inject 2 subcutaneously once weekly ? ?Tips for living a healthier life ? ? ? ? ?Building a Naval architect Diet ?Make most of your meal vegetables and fruits - ? of your plate. ?Aim for color and variety, and remember that potatoes don?t count as vegetables on the Healthy Eating Plate because of their negative impact on blood sugar. ? ?Go for whole grains - ? of your plate. ?Whole and intact grains--whole wheat, barley, wheat berries, quinoa, oats, brown rice, and foods made with them, such as whole wheat pasta--have a milder effect on blood sugar and insulin than white bread, white rice, and other refined grains. ? ?Protein power - ? of your plate. ?Fish, poultry, beans, and nuts are all healthy, versatile  protein sources--they can be mixed into salads, and pair well with vegetables on a plate. Limit red meat, and avoid processed meats such as bacon and sausage. ? ?Healthy plant oils - in moderation. ?Choose healthy vegetable oils like olive, canola, soy, corn, sunflower, peanut, and others, and avoid partially hydrogenated oils, which contain unhealthy trans fats. Remember that low-fat does not mean ?healthy.? ? ?Drink water, coffee, or tea. ?Skip sugary drinks, limit milk and dairy products to one to two servings per day, and limit juice to a small glass per day. ? ?Stay active. ?The red figure running across the [place] is a reminder that staying active is also important in weight control. ? ?The main message of the Healthy Eating Plate is to focus on diet quality: ? ?The type of carbohydrate in the diet is more important than the amount of carbohydrate in the diet, because some sources of carbohydrate--like vegetables (other than potatoes), fruits, whole grains, and beans--are healthier than others. ?The Healthy Eating Plate also advises consumers to avoid sugary beverages, a major source of calories--usually with little nutritional value--in the American diet. ?The Healthy Eating Plate encourages consumers to use healthy oils, and it does not set a maximum on the percentage of calories people should get each day from healthy sources of fat. In this way, the Healthy Eating Plate recommends the opposite of the low-fat message promoted for decades by the USDA. ? ?[email or web address].no ? ?SUGAR ? ?Sugar is a huge problem in the modern day diet. Sugar is a big  contributor to heart disease, diabetes, high triglyceride levels, fatty liver disease and obesity. Sugar is hidden in almost all packaged foods/beverages. Added sugar is extra sugar that is added beyond what is naturally found and has no nutritional benefit for your body. The American Heart  Association recommends limiting added sugars to no more than 25g for women and 36 grams for men per day. There are many names for sugar including maltose, sucrose (names ending in "ose"), high fructose corn syrup, molasses, cane sugar, corn sweetener, raw sugar, syrup, honey or fruit juice concentrate.  ? ?One of the best ways to limit your added sugars is to stop drinking sweetened beverages such as soda, sweet tea, and fruit juice. ? ?There is 65g of added sugars in one 20oz bottle of Coke! That is equal to 7.5 donuts.  ? ?Pay attention and read all nutrition facts labels. Below is an examples of a nutrition facts label. The #1 is showing you the total sugars where the # 2 is showing you the added sugars. This one serving has almost the max amount of added sugars per day! ? ? ? ? ?20 oz Soda ?65g Sugar = 7.5 Glazed Donuts ? ?16oz Energy  ?Drink ?54g Sugar = 6.5 Glazed Donuts ? ?Large Sweet  ?Tea ?38g Sugar = 4 Glazed Donuts ? ?20oz Sports  ?Drink ?34g Sugar = 3.5 Glazed Donuts ? ?8oz Chocolate Milk ?24g Sugar =2.5 Glazed Donuts ? ?8oz Orange  ?Juice ?21g Sugar = 2 Glazed Donuts ? ?1 Juice Box ?14g Sugar = 1.5 Glazed Donuts ? ?16oz Water= NO SUGAR!! ? ?EXERCISE ? ?Exercise is good. We?ve all heard that. In an ideal world, we would all have time and resources to get plenty of it. When you are active, your heart pumps more efficiently and you will feel better.  Multiple studies show that even walking regularly has benefits that include living a longer life. The American Heart Association recommends 150 minutes per week of exercise (30 minutes per day most days of the week). You can do this in any increment you wish. Nine or more 10-minute walks count. So does an hour-long exercise class. Break the time apart into what will work in your life. Some of the best things you can do include walking briskly, jogging, cycling or swimming laps. Not everyone is ready to ?exercise.? Sometimes we need to start with just getting  active. Here are some easy ways to be more active throughout the day: ? Take the stairs instead of the elevator ? Go for a 10-15 minute walk during your lunch break (find a friend to make it more enjoyable) ? When shopping, park at the back of the parking lot ? If you take public transportation, get off one stop early and walk the extra distance ? Pace around while making phone calls ? ?Check with your doctor if you aren?t sure what your limitations may be. Always remember to drink plenty of water when doing any type of exercise. Don?t feel like a failure if you?re not getting the 90-150 minutes per week. If you started by being a couch potato, then just a 10-minute walk each day is a huge improvement. Start with little victories and work your way up. ? ? ?HEALTHY EATING TIPS ? ?When looking to improve your eating habits, whether to lose weight, lower blood pressure or just be healthier, it helps to know what a serving size is.  ? ?Grains ?1 slice of bread, ? bagel, ? cup pasta or rice  Vegetables ?1 cup fresh or raw vegetables, ? cup cooked or canned ?Fruits ?1 piece of medium sized fruit, ? cup canned,   Meats/Proteins ?? cup dried       1 oz meat, 1 egg, ? cup cooked beans, nuts or seeds ? ?Dairy        Fats ?Individual yogurt container, 1 cup (8oz)    1 teaspoon margarine/butter or vegetable  ?milk or milk alternative, 1 slice of cheese          oil; 1 tablespoon mayonnaise or salad dressing                 ? ?Plan ahead: make a menu of the meals for a week then create a grocery list to go with that menu. Consider meals that easily stretch into a night of leftovers, such as stews or casseroles. Or consider making two of your favorite meal and put one in the freezer for another night. Try a night or two each week that is ?meatless? or ?no cook? such as salads. When you get home from the grocery store wash and prepare your vegetables and fruits. Then when you need them they are ready to go.  ? ?Tips for going to  the grocery store: ? [place] store or generic brands ? Check the weekly ad from your store on-line or in their in-store flyer ? Look at the unit price on the shelf tag to compare/contrast the costs of different ite

## 2021-11-18 ENCOUNTER — Ambulatory Visit: Payer: Medicaid Other | Admitting: Nurse Practitioner

## 2021-11-19 ENCOUNTER — Encounter (HOSPITAL_COMMUNITY): Payer: Self-pay

## 2021-11-19 ENCOUNTER — Other Ambulatory Visit (HOSPITAL_COMMUNITY): Payer: Medicaid Other

## 2021-11-26 ENCOUNTER — Telehealth: Payer: Self-pay

## 2021-11-26 NOTE — Telephone Encounter (Signed)
**Note De-Identified Tiffany Le Obfuscation** [name] PA started through covermymeds. ?Key: [id number] ?

## 2021-11-27 ENCOUNTER — Telehealth (HOSPITAL_COMMUNITY): Payer: Self-pay | Admitting: *Deleted

## 2021-11-27 NOTE — Telephone Encounter (Signed)
Left message to call cardiac rehab to confirm appointment for orientation tomorrow.Barnet Pall, RN,BSN ?11/27/2021 5:05 PM  ?

## 2021-11-28 ENCOUNTER — Encounter (HOSPITAL_COMMUNITY)
Admission: RE | Admit: 2021-11-28 | Discharge: 2021-11-28 | Disposition: A | Discharge status: Home / Self Care | Payer: Medicaid Other | Source: Ambulatory Visit | Attending: Internal Medicine | Admitting: Internal Medicine

## 2021-11-28 VITALS — BP 120/80 | HR 76 | Ht 65.0 in | Wt 258.4 lb

## 2021-11-28 DIAGNOSIS — I429 Cardiomyopathy, unspecified: Secondary | ICD-10-CM | POA: Insufficient documentation

## 2021-11-28 LAB — GLUCOSE, CAPILLARY: Glucose-Capillary: 162 mg/dL — ABNORMAL HIGH (ref 70–99)

## 2021-11-28 NOTE — Progress Notes (Signed)
Cardiac Rehab Medication Review by a Nurse ? ?Does the patient  feel that his/her medications are working for him/her?  YES  ? ?Has the patient been experiencing any side effects to the medications prescribed?  NO ? ?Does the patient measure his/her own blood pressure or blood glucose at home?  YES  ? ?Does the patient have any problems obtaining medications due to transportation or finances?   NO ? ?Understanding of regimen: good ?Understanding of indications: good ?Potential of compliance: excellent ? ? ? ?Nurse  comments: Tiffany Le is taking her medications as prescribed and has a good understanding of what her medications are for. Tiffany Le checks her CBG's daily. Tiffany Le has a wrist BP monitor. Tiffany Le says she will bring her home cuff to cardiac rehab for comparison. ? ? ? ?Harrell Gave RN ?11/28/2021 12:40 PM ?  ?

## 2021-11-29 ENCOUNTER — Encounter (HOSPITAL_COMMUNITY): Payer: Self-pay

## 2021-11-29 NOTE — Progress Notes (Signed)
Cardiac Individual Treatment Plan ? ?Patient Details  ?Name: Tiffany Le ?MRN: [id number] ?Date of Birth: [birth year] ?Referring Provider:   ?Flowsheet Row CARDIAC REHAB PHASE II ORIENTATION from 11/28/2021 in [place]  ?Referring Provider Dr. Lenna Sciara, MD  ? ?  ? ? ?Initial Encounter Date:  ?Flowsheet Row CARDIAC REHAB PHASE II ORIENTATION from 11/28/2021 in [place]  ?Date 11/28/21  ? ?  ? ? ?Visit Diagnosis: Cardiomyopathy ? ?Patient's Home Medications on Admission: ? ?Current Outpatient Medications:  ?  albuterol (PROVENTIL HFA) 108 (90 Base) MCG/ACT inhaler, Inhale 2 puffs into the lungs every 6 (six) hours as needed for wheezing., Disp: 18 g, Rfl: 12 ?  aspirin EC 81 MG tablet, Take 81 mg by mouth daily., Disp: , Rfl:  ?  dapagliflozin propanediol (FARXIGA) 10 MG TABS tablet, Take 1 tablet (10 mg total) by mouth daily. Need appointment for further refills, Disp: 90 tablet, Rfl: 3 ?  ezetimibe (ZETIA) 10 MG tablet, Take 1 tablet (10 mg total) by mouth daily., Disp: 90 tablet, Rfl: 3 ?  gabapentin (NEURONTIN) 300 MG capsule, Take 1 capsule (300 mg total) by mouth 2 (two) times daily., Disp: 60 capsule, Rfl: 2 ?  insulin glargine (LANTUS SOLOSTAR) 100 UNIT/ML Solostar Pen, Inject 8 Units into the skin 2 (two) times daily., Disp: , Rfl:  ?  losartan (COZAAR) 50 MG tablet, Take 50 mg by mouth at bedtime., Disp: , Rfl:  ?  methocarbamol (ROBAXIN) 500 MG tablet, Take 1 tablet (500 mg total) by mouth every 8 (eight) hours as needed for muscle spasms., Disp: 21 tablet, Rfl: 0 ?  nitroGLYCERIN (NITROSTAT) 0.4 MG SL tablet, DISSOLVE 1 TABLET UNDER THE TONGUE EVERY 5 MINUTES FOR 3 DOSES AS NEEDED FOR CHEST PAIN, Disp: 25 tablet, Rfl: 5 ?  rosuvastatin (CRESTOR) 40 MG tablet, Take 1 tablet (40 mg total) by mouth daily., Disp: 90 tablet, Rfl: 3 ?  Semaglutide,0.25 or 0.'5MG'$ /DOS, (OZEMPIC, 0.25 OR 0.5 MG/DOSE,) 2 MG/3ML SOPN, Inject 0.'25mg'$  subcutaneously  once weekly for 4 weeks, then increase to 0.'5mg'$  once weekly, Disp: 3 mL, Rfl: 1 ?  torsemide (DEMADEX) 20 MG tablet, TAKE 1 TABLET(20 MG) BY MOUTH TWICE DAILY, Disp: 60 tablet, Rfl: 3 ?  Insulin Pen Needle 31G X 5 MM MISC, Use as directed with insulin., Disp: 300 each, Rfl: 3 ?  Vitamin D, Ergocalciferol, (DRISDOL) 1.25 MG (50000 UNIT) CAPS capsule, TAKE 1 CAPSULE BY MOUTH EVERY 7 DAYS FOR 8 DOSES (Patient not taking: Reported on 11/21/2021), Disp: 8 capsule, Rfl: 0 ? ?Past Medical History: ?Past Medical History:  ?Diagnosis Date  ? Coronary artery disease   ? CVA (cerebral vascular accident) ([place]) 09/2020  ? Diabetes mellitus without complication ([place])   ? Diabetic neuropathy ([place]) 02/2020  ? 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 Hyperlipidemia age 75  ? Hypertension age 35  ? Hypocalcemia 11/2019  ? Hypokalemia 11/2019  ? Iron deficiency anemia   ? NSTEMI (non-ST elevated myocardial infarction) ([place]) 09/2020  ? Obesity   ? Proteinuria 12/09/2019  ? Pulmonary embolism ([place]) 09/2020  ? ? ?Tobacco Use: ?Social History  ? ?Tobacco Use  ?Smoking Status Never  ?Smokeless Tobacco Never  ? ? ?Labs: ?Review Flowsheet   ? ?  ?  Latest Ref Rng & Units 10/27/2020 12/14/2020 05/20/2021 08/20/2021  ?Labs for ITP Cardiac and Pulmonary Rehab  ?Cholestrol 100 - 199 mg/dL   185     ?LDL (calc) 0 - 99  mg/dL   141     ?HDL-C >39 mg/dL   33     ?Trlycerides 0 - 149 mg/dL   59     ?Hemoglobin A1c 5.7 - 6.4 %   5.9   6.4    ? 6.4    ? 6.4    ? 6.4    ?TCO2 22 - 32 mmol/L  24      ?O2 Saturation % 59.6       ? ?  10/29/2021  ?Labs for ITP Cardiac and Pulmonary Rehab  ?Cholestrol 140    ?LDL (calc) 86    ?HDL-C 42    ?Trlycerides 58    ?Hemoglobin A1c   ?TCO2   ?O2 Saturation   ?  ? ? Multiple values from one day are sorted in reverse-chronological order  ?  ?  ? ? ?Capillary Blood Glucose: ?Lab Results  ?Component Value Date  ? GLUCAP 162 (H) 11/28/2021  ? GLUCAP 168 (H) 12/28/2020  ? GLUCAP 85 12/28/2020  ? GLUCAP 126 (H) 12/27/2020  ? GLUCAP 69 (L)  12/27/2020  ? ? ? ?Exercise Target Goals: ?Exercise Program Goal: ?Individual exercise prescription set using results from initial 6 min walk test and THRR while considering  patient?s activity barriers and safety.  ? ?Exercise Prescription Goal: ?Starting with aerobic activity 30 plus minutes a day, 3 days per week for initial exercise prescription. Provide home exercise prescription and guidelines that participant acknowledges understanding prior to discharge. ? ?Activity Barriers & Risk Stratification: ? Activity Barriers & Cardiac Risk Stratification - 11/28/21 1201   ? ?  ? Activity Barriers & Cardiac Risk Stratification  ? Activity Barriers Balance Concerns;Deconditioning   ? Cardiac Risk Stratification High   ? ?  ?  ? ?  ? ? ?6 Minute Walk: ? 6 Minute Walk   ? ? [place] Name 11/28/21 1158  ?  ?  ?  ? 6 Minute Walk  ? Phase Initial    ? Distance 1318 feet    ? Walk Time 6 minutes    ? # of Rest Breaks 0    ? MPH 2.5    ? METS 3.71    ? RPE 9    ? Perceived Dyspnea  0    ? VO2 Peak 12.97    ? Symptoms Yes (comment)    ? Comments Bilateral leg tightness    ? Resting HR 76 bpm    ? Resting BP 120/80    ? Resting Oxygen Saturation  100 %    ? Exercise Oxygen Saturation  during 6 min walk 100 %    ? Max Ex. HR 120 bpm    ? Max Ex. BP 142/82    ? 2 Minute Post BP 122/82    ? ?  ?  ? ?  ? ? ?Oxygen Initial Assessment: ? ? ?Oxygen Re-Evaluation: ? ? ?Oxygen Discharge (Final Oxygen Re-Evaluation): ? ? ?Initial Exercise Prescription: ? Initial Exercise Prescription - 11/28/21 1200   ? ?  ? Date of Initial Exercise RX and Referring Provider  ? Date 11/28/21   ? Referring Provider Dr. Lenna Sciara, MD   ? Expected Discharge Date 01/24/22   ?  ? NuStep  ? Level 1   ? SPM 60   ? Minutes 15   ? METs 1.8   ?  ? Track  ? Laps 11   ? Minutes 15   ? METs 2.28   ?  ? Prescription Details  ?  Frequency (times per week) 3   ? Duration Progress to 30 minutes of continuous aerobic without signs/symptoms of physical distress   ?  ?  Intensity  ? THRR 40-80% of Max Heartrate 70-140   ? Ratings of Perceived Exertion 11-13   ? Perceived Dyspnea 0-4   ?  ? Progression  ? Progression Continue progressive overload as per policy without signs/symptoms or physical distress.   ?  ? Resistance Training  ? Training Prescription Yes   ? Weight 3   ? Reps 10-15   ? ?  ?  ? ?  ? ? ?Perform Capillary Blood Glucose checks as needed. ? ?Exercise Prescription Changes: ? ? ?Exercise Comments: ? ? ?Exercise Goals and Review: ? ? Exercise Goals   ? ? [place] Name 11/28/21 1204  ?  ?  ?  ?  ?  ? Exercise Goals  ? Increase Physical Activity Yes      ? Intervention Provide advice, education, support and counseling about physical activity/exercise needs.;Develop an individualized exercise prescription for aerobic and resistive training based on initial evaluation findings, risk stratification, comorbidities and participant's personal goals.      ? Expected Outcomes Short Term: Attend rehab on a regular basis to increase amount of physical activity.;Long Term: Add in home exercise to make exercise part of routine and to increase amount of physical activity.;Long Term: Exercising regularly at least 3-5 days a week.      ? Increase Strength and Stamina Yes      ? Intervention Provide advice, education, support and counseling about physical activity/exercise needs.;Develop an individualized exercise prescription for aerobic and resistive training based on initial evaluation findings, risk stratification, comorbidities and participant's personal goals.      ? Expected Outcomes Short Term: Increase workloads from initial exercise prescription for resistance, speed, and METs.;Short Term: Perform resistance training exercises routinely during rehab and add in resistance training at home;Long Term: Improve cardiorespiratory fitness, muscular endurance and strength as measured by increased METs and functional capacity (6MWT)      ? Able to understand and use rate of perceived  exertion (RPE) scale Yes      ? Intervention Provide education and explanation on how to use RPE scale      ? Expected Outcomes Short Term: Able to use RPE daily in rehab to express subjective intensity level;Long Term

## 2021-12-02 ENCOUNTER — Encounter (HOSPITAL_COMMUNITY)
Admission: RE | Admit: 2021-12-02 | Discharge: 2021-12-02 | Disposition: A | Discharge status: Home / Self Care | Payer: Medicaid Other | Source: Ambulatory Visit | Attending: Internal Medicine | Admitting: Internal Medicine

## 2021-12-02 DIAGNOSIS — I429 Cardiomyopathy, unspecified: Secondary | ICD-10-CM

## 2021-12-02 LAB — GLUCOSE, CAPILLARY
Glucose-Capillary: 161 mg/dL — ABNORMAL HIGH (ref 70–99)
Glucose-Capillary: 203 mg/dL — ABNORMAL HIGH (ref 70–99)

## 2021-12-02 NOTE — Progress Notes (Signed)
Daily Session Note ? ?Patient Details  ?Name: Tiffany Le ?MRN: [id number] ?Date of Birth: 1975/10/07 ?Referring Provider:   ?Flowsheet Row CARDIAC REHAB PHASE II ORIENTATION from 11/28/2021 in [place]  ?Referring Provider Dr. Lenna Sciara, MD  ? ?  ? ? ?Encounter Date: 12/02/2021 ? ?Check In: ? Session Check In - 12/02/21 1035   ? ?  ? Check-In  ? Supervising physician immediately available to respond to emergencies [facility] immediately available   ? Physician(s) Dr. Pietro Cassis   ? Location [facility]   ? Staff Present Barnet Pall, RN, BSN;Jetta Walker BS, ACSM EP-C, Exercise Physiologist;Olinty Deweese, MS, ACSM CEP, Exercise Physiologist;David Intel, MS, ACSM-CEP, CCRP, Exercise Physiologist;Carlette Wilber [place], Therapist, sports, BSN   ? Virtual Visit No   ? Medication changes reported     No   ? Fall or balance concerns reported    No   ? Tobacco Cessation No Change   ? Warm-up and Cool-down Performed as group-led instruction   ? Resistance Training Performed Yes   ? VAD Patient? No   ? PAD/SET Patient? No   ?  ? Pain Assessment  ? Currently in Pain? No/denies   ? Pain Score 0-No pain   ? Multiple Pain Sites No   ? ?  ?  ? ?  ? ? ?Capillary Blood Glucose: ?Results for orders placed or performed during the hospital encounter of 11/28/21 (from the past 24 hour(s))  ?Glucose, capillary     Status: Abnormal  ? Collection Time: 12/02/21 10:27 AM  ?Result Value Ref Range  ? Glucose-Capillary 161 (H) 70 - 99 mg/dL  ?Glucose, capillary     Status: Abnormal  ? Collection Time: 12/02/21 11:17 AM  ?Result Value Ref Range  ? Glucose-Capillary 203 (H) 70 - 99 mg/dL  ? ? ? Exercise Prescription Changes - 12/02/21 1027   ? ?  ? Response to Exercise  ? Blood Pressure (Admit) 132/80   ? Blood Pressure (Exercise) 150/84   ? Blood Pressure (Exit) 122/82   ? Heart Rate (Admit) 81 bpm   ? Heart Rate (Exercise) 125 bpm   ? Heart Rate (Exit) 91 bpm   ? Rating of Perceived Exertion  (Exercise) 12   ? Symptoms None   ? Comments Off to a good start with exercise.   ? Duration Continue with 30 min of aerobic exercise without signs/symptoms of physical distress.   ? Intensity THRR unchanged   ?  ? Progression  ? Progression Continue to progress workloads to maintain intensity without signs/symptoms of physical distress.   ? Average METs 2.1   ?  ? Resistance Training  ? Training Prescription Yes   ? Weight 3   ? Reps 10-15   ? Time 10 Minutes   ?  ? Interval Training  ? Interval Training No   ?  ? NuStep  ? Level 1   ? SPM 85   ? Minutes 15   ? METs 2   ?  ? Track  ? Laps 11   ? Minutes 15   ? METs 2.28   ? ?  ?  ? ?  ? ? ?Social History  ? ?Tobacco Use  ?Smoking Status Never  ?Smokeless Tobacco Never  ? ? ?Goals Met:  ?Exercise tolerated well ?No report of concerns or symptoms today ?Strength training completed today ? ?Goals Unmet:  ?Not Applicable ? ?Comments: Tiffany Le  started cardiac rehab today.  Pt tolerated light exercise  without difficulty. VSS, telemetry-Sinus Rhythm with t wave inversion, asymptomatic.  Medication list reconciled. Pt denies barriers to medicaiton compliance.  PSYCHOSOCIAL ASSESSMENT:  PHQ-0. Pt exhibits positive coping skills, hopeful outlook with supportive family. No psychosocial needs identified at this time, no psychosocial interventions necessary.    Pt enjoys cooking and gardening.   Pt oriented to exercise equipment and routine.    Understanding verbalized. Barnet Pall, RN,BSN ?12/02/2021 5:25 PM  ? ? ?Dr. Fransico Him is Medical Director for Cardiac Rehab at [facility]. ?

## 2021-12-02 NOTE — Progress Notes (Signed)
Tiffany Le 46 y.o. female ?Nutrition Note ?[name] is motivated to make lifestyle changes to aid with cardiac/pulmonary rehab. Patient has medical history of HTN, NSTEMI, CAD, congestive heart failure, CVA, pulmonary edema, DM2, s/p CABG x4, iron deficiency anemia.  She recently started ozempic for DM2 and weight loss; she reports struggling with some nausea. She reports significant fatigue. She works as a Event organiser. She has been making many dietary changes including reduced soda, increased fiber intake.  ? ?Labs: B12 225, Vitamin D 6.0, A1c 6.4 ? ?Nutrition Diagnosis ?Morbid Obesity related to excessive energy intake as evidenced by a 43.0 ? ?Nutrition Intervention ?Pt?s individual nutrition plan reviewed with pt. ?Benefits of adopting Heart Healthy diet discussed.  ?Continue client-centered nutrition education by RD, as part of interdisciplinary care. ? ?Monitor/Evaluation: ?Patient reports motivation to make lifestyle changes for adherence to heart healthy diet recommendation, blood sugar control, and weight management. We discussed convenience breakfast ideas today. We discussed energy level including start MVI as she is not currently taking B12 despite low levels, and referral for sleep study. Handouts/notes given. Patient amicable to RD suggestions and verbalizes understanding. Will follow-up as needed.  ? ?8 minutes spent in review of topics related to a heart healthy diet including sodium intake, blood sugar control, weight management, and fiber intake. ? ?Goal(s) ?Pair protein + carbohydrate at breakfast to aid with satiety, blood sugar control. (Examples: greek yogurt + fruit, Fairlife protein shake + fruit, [employer] protein bart, etc).  ?May being MVI for B12 deficiency, hx of iron deficiency, etc.  ?Pt to identify and limit food sources of saturated fat, trans fat, refined carbohydrates and sodium ?Pt to identify food quantities necessary to achieve weight loss of 6-24 lb at graduation  from cardiac rehab.  ?Pt able to name foods that affect blood glucose. Continue to limit simple sugars, refined carbohydrates, sugary beverages, etc.  ?Pt to describe the benefit of including lean protein/plant proteins, fruits, vegetables, whole grains, nuts/seeds, and low-fat dairy products in a heart healthy meal plan. ?Pt to practice mindful and intuitive eating exercises ? ?Plan:  ?Will provide client-centered nutrition education as part of interdisciplinary care ?Monitor and evaluate progress toward nutrition goal with team. ? ? ?Tiffany Lisby [place], MS, RDN, LDN  ?

## 2021-12-04 ENCOUNTER — Encounter (HOSPITAL_COMMUNITY)
Admission: RE | Admit: 2021-12-04 | Discharge: 2021-12-04 | Disposition: A | Discharge status: Home / Self Care | Payer: Medicaid Other | Source: Ambulatory Visit | Attending: Internal Medicine | Admitting: Internal Medicine

## 2021-12-04 DIAGNOSIS — I429 Cardiomyopathy, unspecified: Secondary | ICD-10-CM

## 2021-12-04 LAB — GLUCOSE, CAPILLARY
Glucose-Capillary: 205 mg/dL — ABNORMAL HIGH (ref 70–99)
Glucose-Capillary: 250 mg/dL — ABNORMAL HIGH (ref 70–99)

## 2021-12-04 NOTE — Telephone Encounter (Signed)
**Note De-Identified Kimo Bancroft Obfuscation** Letter received Jonni Oelkers fax from [facility] stating that they have approved the pts Farxiga PA from 11/15/21 until further notice. ?ID #: [id number] ? ?I have notified Regina #[id number] Lady Gary, [place] (Ph: [phone]) of this approval. ?
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
## 2021-12-05 ENCOUNTER — Ambulatory Visit (HOSPITAL_COMMUNITY): Payer: Medicaid Other | Attending: Cardiology

## 2021-12-05 DIAGNOSIS — I6389 Other cerebral infarction: Secondary | ICD-10-CM

## 2021-12-05 DIAGNOSIS — I639 Cerebral infarction, unspecified: Secondary | ICD-10-CM | POA: Insufficient documentation

## 2021-12-05 LAB — ECHOCARDIOGRAM COMPLETE BUBBLE STUDY
Area-P 1/2: 3.21 cm2
S' Lateral: 2.5 cm

## 2021-12-05 MED ORDER — PERFLUTREN LIPID MICROSPHERE
1.0000 mL | INTRAVENOUS | Status: AC | PRN
  Administered 2021-12-05: 2 mL via INTRAVENOUS

## 2021-12-06 ENCOUNTER — Encounter (HOSPITAL_COMMUNITY): Payer: Medicaid Other

## 2021-12-06 ENCOUNTER — Encounter: Payer: Self-pay | Admitting: Internal Medicine

## 2021-12-06 ENCOUNTER — Encounter: Payer: Self-pay | Admitting: Pharmacist

## 2021-12-06 ENCOUNTER — Telehealth (HOSPITAL_COMMUNITY): Payer: Self-pay | Admitting: Nurse Practitioner

## 2021-12-06 MED ORDER — GLIPIZIDE 5 MG PO TABS: 5.0000 mg | ORAL_TABLET | Freq: Two times a day (BID) | ORAL | 1 refills | Status: DC

## 2021-12-06 NOTE — Telephone Encounter (Signed)
Duplicate message sent by pt, already addressed in other MyChart encounter. ?

## 2021-12-06 NOTE — Telephone Encounter (Signed)
Called patient w echo results and she asked about this mychart message.  She had not read the reply from Fuller [place], PharmD yet. ? ?I reviewed that message w her.  The patient states that she threw away the glipizide 10 mg tablets that she stopped when she started taking Ozempic.  I have sent a new prescription for the 5 mg BID tablets to her pharmacy.  Will forward to PharmD as [name].  60 tabs, refill 1. ? ?Pt aware Tiffany Le will be in touch w her next week re: increasing Ozempic. ?

## 2021-12-07 ENCOUNTER — Other Ambulatory Visit: Payer: Self-pay | Admitting: Nurse Practitioner

## 2021-12-07 DIAGNOSIS — E1140 Type 2 diabetes mellitus with diabetic neuropathy, unspecified: Secondary | ICD-10-CM

## 2021-12-09 ENCOUNTER — Encounter (HOSPITAL_COMMUNITY)
Admission: RE | Admit: 2021-12-09 | Discharge: 2021-12-09 | Disposition: A | Discharge status: Home / Self Care | Payer: Medicaid Other | Source: Ambulatory Visit | Attending: Internal Medicine | Admitting: Internal Medicine

## 2021-12-09 ENCOUNTER — Telehealth: Payer: Self-pay | Admitting: Pharmacist

## 2021-12-09 DIAGNOSIS — I429 Cardiomyopathy, unspecified: Secondary | ICD-10-CM | POA: Insufficient documentation

## 2021-12-09 NOTE — Telephone Encounter (Signed)
Called pt to follow up with Ozempic tolerability. Experienced nausea at first, did curb her appetite. Glucose 122 this AM after taking glipizide. ? ?Will increase Ozempic to 0.'5mg'$  weekly for the next 6 weeks. She'll continue to monitor glucose - goal will be to decrease/stop glipizide as Ozempic titration continues. ? ?Will call pt in another 6 weeks for further titration. ?

## 2021-12-11 ENCOUNTER — Encounter (HOSPITAL_COMMUNITY): Payer: Medicaid Other

## 2021-12-12 IMAGING — DX DG CHEST 1V PORT
1 series · 1 of 1 positions shown · non-contrast
Comparison: 10/14/2020

CLINICAL DATA: Chest pain following open heart surgery

EXAM:
PORTABLE CHEST 1 VIEW

[chest]
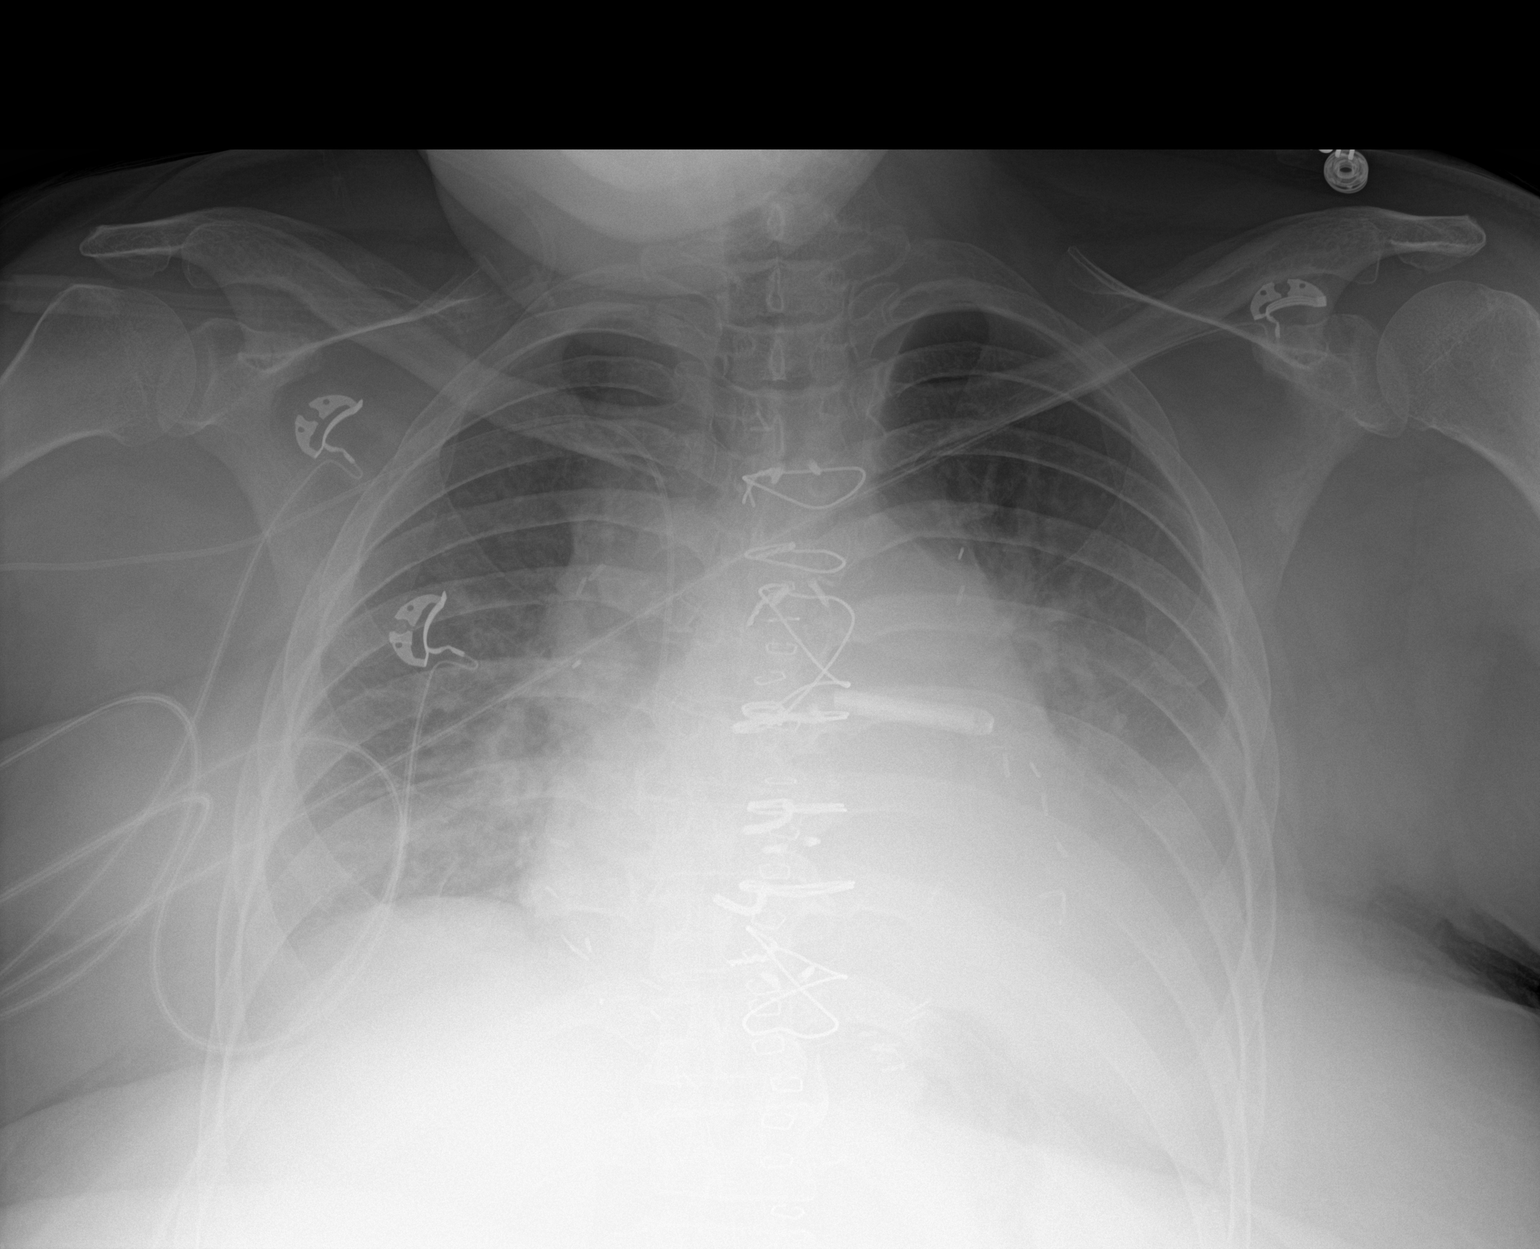

[1 of 1 positions shown; findings below may reference images not displayed]

FINDINGS: Lung volumes are small with resultant vascular crowding at the hila.
Moderate left pleural effusion is present. Trace perihilar pulmonary
edema is suspected. No pneumothorax. Right upper extremity PICC line
is seen with its tip within the superior vena cava. Coronary artery
bypass grafting has been performed. Cardiac size is mildly
enlarged.
IMPRESSION: Pulmonary hypoinflation.

Trace perihilar pulmonary edema.  Moderate left pleural effusion.

## 2021-12-13 ENCOUNTER — Encounter (HOSPITAL_COMMUNITY)
Admission: RE | Admit: 2021-12-13 | Discharge: 2021-12-13 | Disposition: A | Discharge status: Home / Self Care | Payer: Medicaid Other | Source: Ambulatory Visit | Attending: Internal Medicine | Admitting: Internal Medicine

## 2021-12-13 DIAGNOSIS — I429 Cardiomyopathy, unspecified: Secondary | ICD-10-CM | POA: Diagnosis not present

## 2021-12-13 IMAGING — US IR THORACENTESIS ASP PLEURAL SPACE W/IMG GUIDE
1 series · 5 of 5 positions shown · non-contrast
Comparison: none

INDICATION: Patient with history of CABG x4 10/09/2020, dyspnea, and left pleural
effusion. Request is made for therapeutic left thoracentesis.

[Series 1: ir (id) (id)/(id)/(id) ir · 5 of 5 slices shown]
[im 1/5]
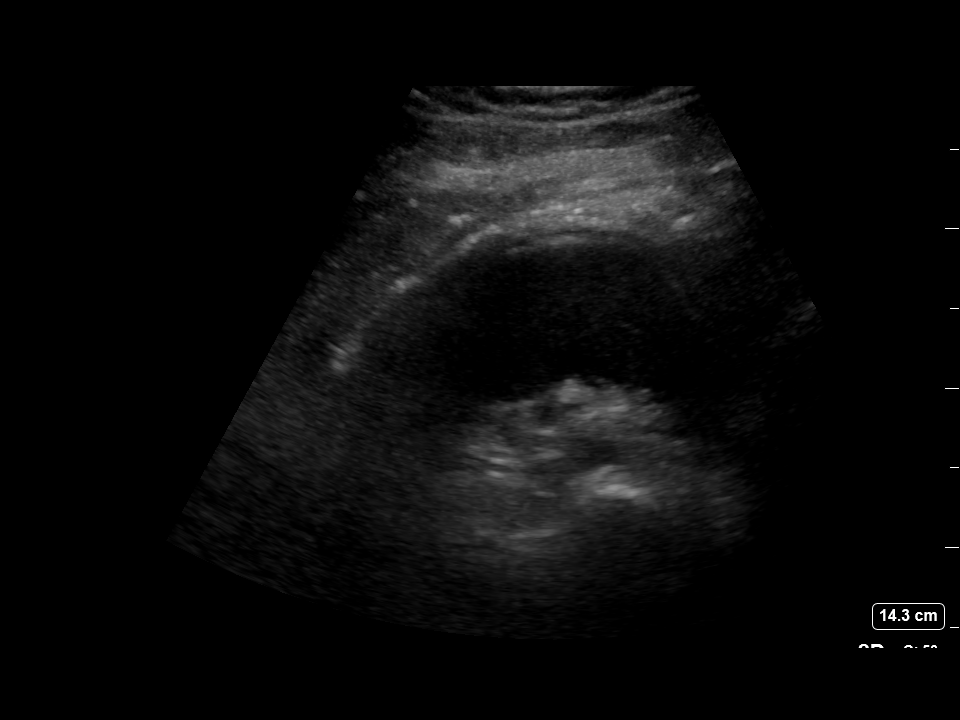
[im 2/5]
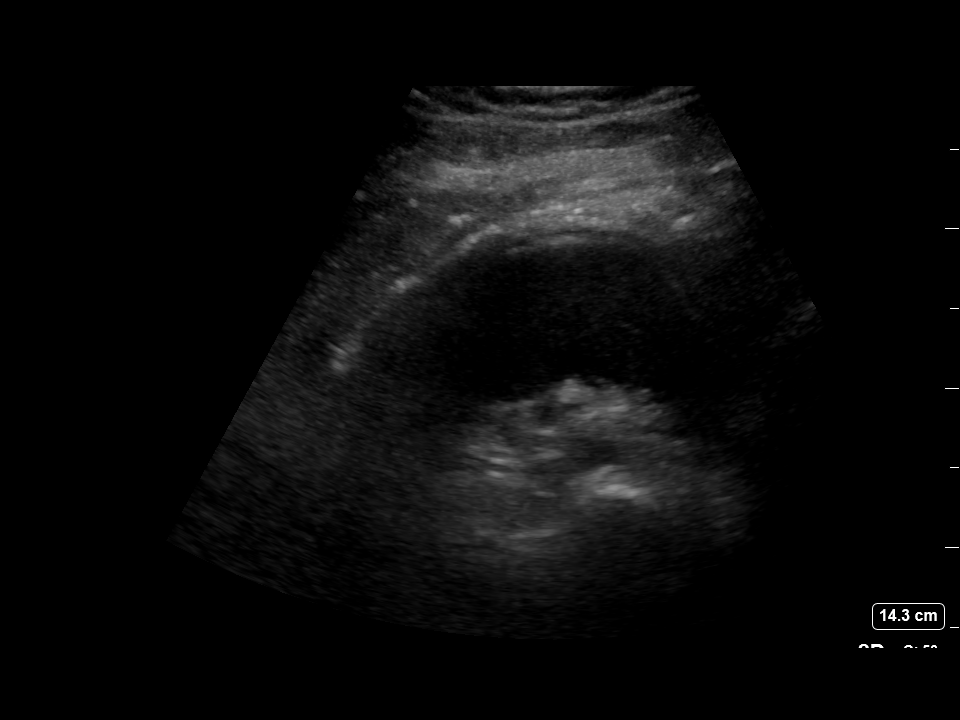
[im 3/5]
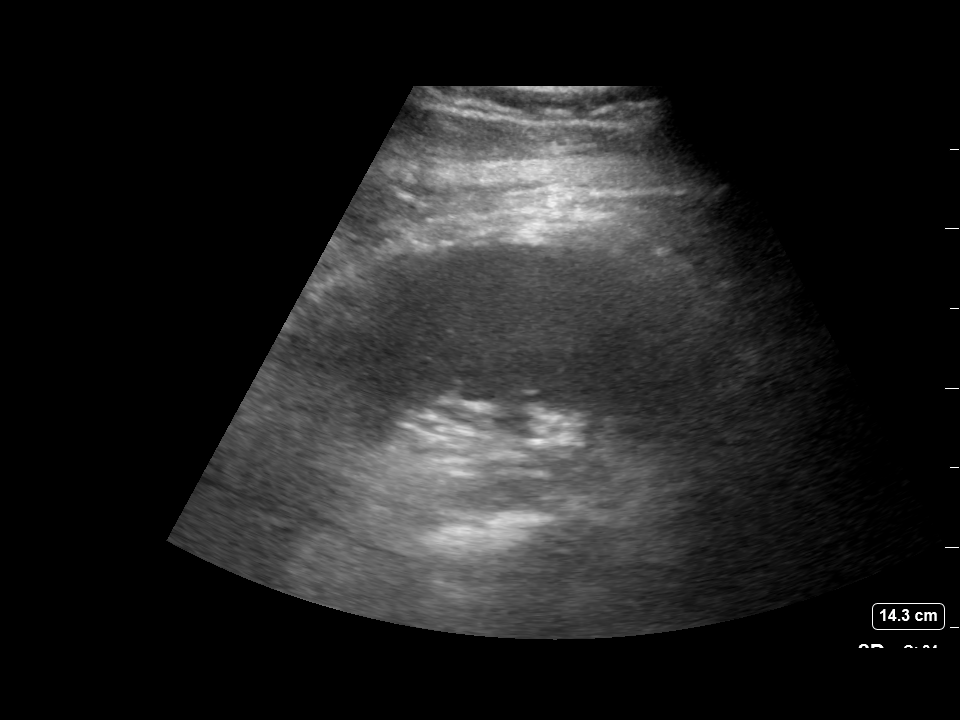
[im 4/5]
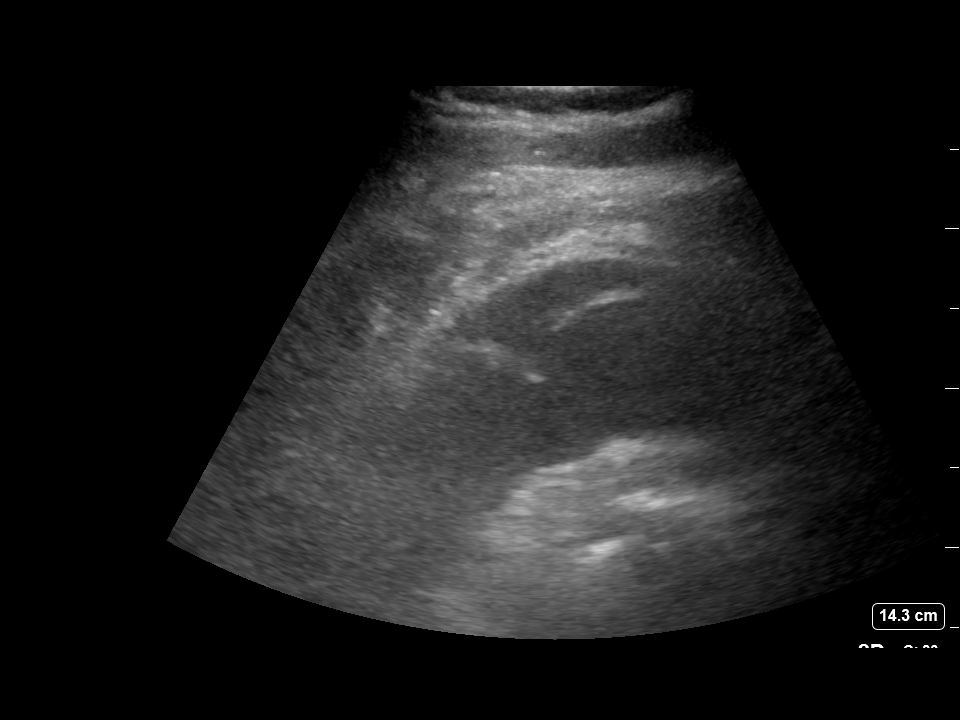
[im 5/5]
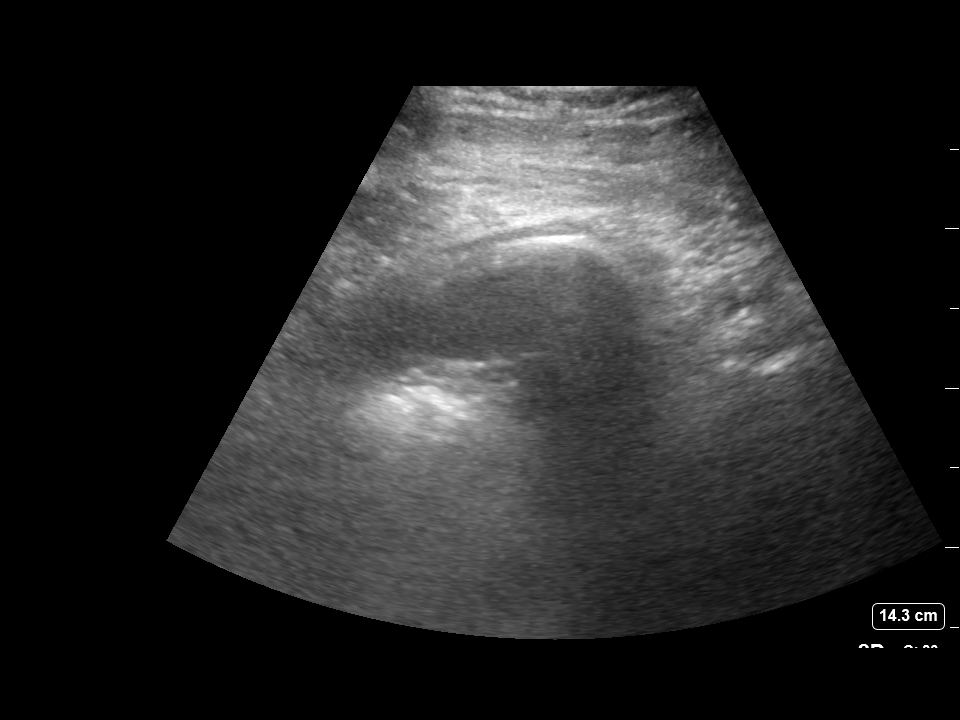

[5 of 5 positions shown; findings below may reference images not displayed]

EXAM:
ULTRASOUND GUIDED THERAPEUTIC LEFT THORACENTESIS

MEDICATIONS:
15 mL 1% lidocaine

COMPLICATIONS:
None immediate.

PROCEDURE:
An ultrasound guided thoracentesis was thoroughly discussed with the
patient and questions answered. The benefits, risks, alternatives
and complications were also discussed. The patient understands and
wishes to proceed with the procedure. Written consent was obtained.

Ultrasound was performed to localize and mark an adequate pocket of
fluid in the left chest. The area was then prepped and draped in the
normal sterile fashion. 1% Lidocaine was used for local anesthesia.
Under ultrasound guidance a 6 Fr Safe-T-Centesis catheter was
introduced. Thoracentesis was performed however only a small amount
of fluid was able to be aspirated. Attempts were made to reposition
the catheter however the patient declined. As such, the procedure
was terminated. The catheter was removed and a dressing applied.
FINDINGS: A total of approximately 100 mL of light red fluid was removed.
Attempt to reposition catheter to increase drainage, however patient
declined.
IMPRESSION: Successful ultrasound guided left thoracentesis yielding 100 mL of
pleural fluid.

## 2021-12-13 IMAGING — DX DG CHEST 1V
1 series · 1 of 1 positions shown · non-contrast
Comparison: 10/16/2020; 10/15/2020

CLINICAL DATA: Post attempted left-sided thoracentesis

EXAM:
CHEST  1 VIEW

[chest ap]
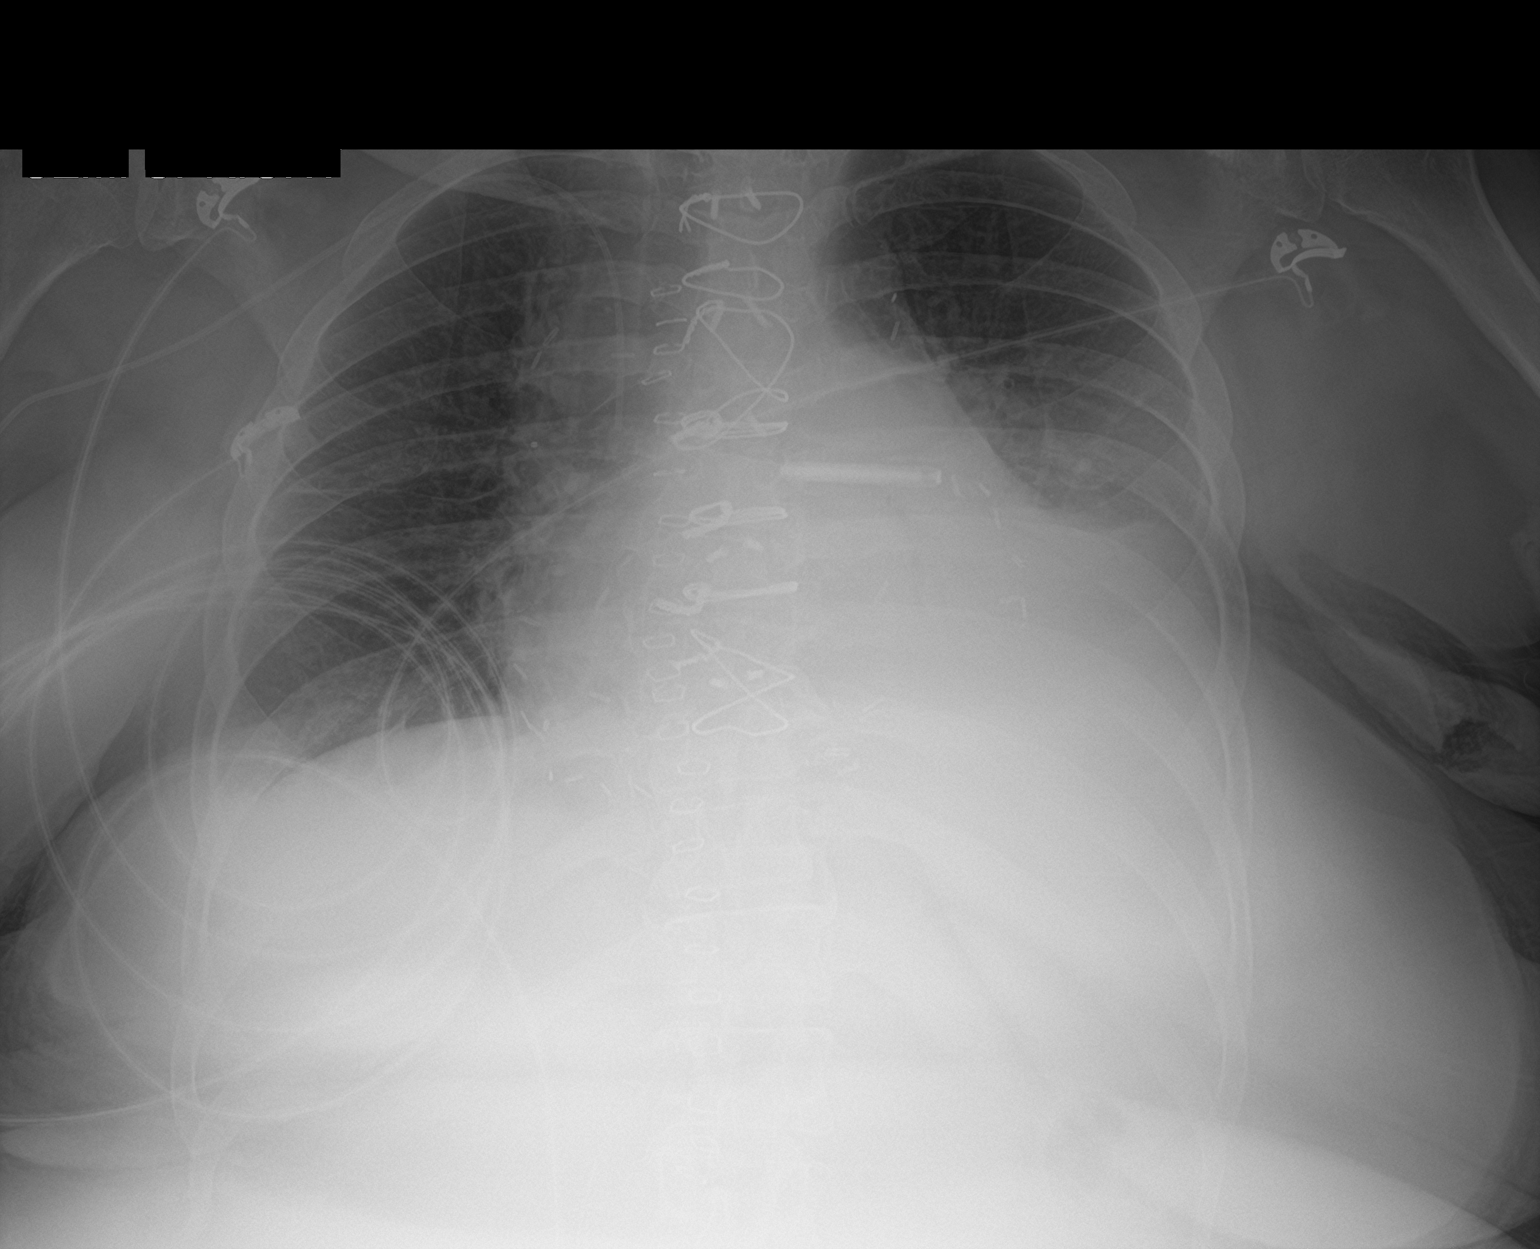

[1 of 1 positions shown; findings below may reference images not displayed]

FINDINGS: Grossly unchanged enlarged cardiac silhouette and mediastinal
contours post median sternotomy, CABG and atrial appendage clipping.
Stable position of support apparatus. No significant change in small
to moderate-sized layering left-sided effusion with associated left
mid and lower lung heterogeneous/consolidative opacities. Pulmonary
vasculature appears indistinct with cephalization of flow. Unchanged
trace right-sided pleural effusion. No acute osseous abnormalities.
IMPRESSION: 1. No change in small to moderate-sized layering left-sided effusion
post thoracentesis. No pneumothorax.
2. Otherwise, similar findings of cardiomegaly, pulmonary edema,
trace right-sided effusion and bibasilar heterogeneous/consolidative
opacities, left greater than right, atelectasis versus infiltrate.

## 2021-12-16 ENCOUNTER — Encounter (HOSPITAL_COMMUNITY)
Admission: RE | Admit: 2021-12-16 | Discharge: 2021-12-16 | Disposition: A | Discharge status: Home / Self Care | Payer: Medicaid Other | Source: Ambulatory Visit | Attending: Cardiovascular Disease | Admitting: Cardiovascular Disease

## 2021-12-16 DIAGNOSIS — I429 Cardiomyopathy, unspecified: Secondary | ICD-10-CM

## 2021-12-16 LAB — GLUCOSE, CAPILLARY: Glucose-Capillary: 130 mg/dL — ABNORMAL HIGH (ref 70–99)

## 2021-12-16 NOTE — Progress Notes (Signed)
Cardiac Individual Treatment Plan  Patient Details  Name: Tiffany Le MRN: [id number] Date of Birth: 1976-06-11 Referring Provider:   Flowsheet Row CARDIAC REHAB PHASE II ORIENTATION from 11/28/2021 in [facility] CARDIAC REHAB  Referring Provider Dr. Alverda Skeans, MD       Initial Encounter Date:  Flowsheet Row CARDIAC REHAB PHASE II ORIENTATION from 11/28/2021 in [facility] CARDIAC REHAB  Date 11/28/21       Visit Diagnosis: Cardiomyopathy  Patient's Home Medications on Admission:  Current Outpatient Medications:    albuterol (PROVENTIL HFA) 108 (90 Base) MCG/ACT inhaler, Inhale 2 puffs into the lungs every 6 (six) hours as needed for wheezing., Disp: 18 g, Rfl: 12   aspirin EC 81 MG tablet, Take 81 mg by mouth daily., Disp: , Rfl:    dapagliflozin propanediol (FARXIGA) 10 MG TABS tablet, Take 1 tablet (10 mg total) by mouth daily. Need appointment for further refills, Disp: 90 tablet, Rfl: 3   ezetimibe (ZETIA) 10 MG tablet, Take 1 tablet (10 mg total) by mouth daily., Disp: 90 tablet, Rfl: 3   gabapentin (NEURONTIN) 300 MG capsule, TAKE 1 CAPSULE(300 MG) BY MOUTH TWICE DAILY, Disp: 60 capsule, Rfl: 2   glipiZIDE (GLUCOTROL) 5 MG tablet, Take 1 tablet (5 mg total) by mouth 2 (two) times daily before a meal., Disp: 60 tablet, Rfl: 1   insulin glargine (LANTUS SOLOSTAR) 100 UNIT/ML Solostar Pen, Inject 8 Units into the skin 2 (two) times daily., Disp: , Rfl:    Insulin Pen Needle 31G X 5 MM MISC, Use as directed with insulin., Disp: 300 each, Rfl: 3   losartan (COZAAR) 50 MG tablet, Take 50 mg by mouth at bedtime., Disp: , Rfl:    methocarbamol (ROBAXIN) 500 MG tablet, Take 1 tablet (500 mg total) by mouth every 8 (eight) hours as needed for muscle spasms., Disp: 21 tablet, Rfl: 0   nitroGLYCERIN (NITROSTAT) 0.4 MG SL tablet, DISSOLVE 1 TABLET UNDER THE TONGUE EVERY 5 MINUTES FOR 3 DOSES AS NEEDED FOR CHEST PAIN, Disp: 25 tablet, Rfl: 5    rosuvastatin (CRESTOR) 40 MG tablet, Take 1 tablet (40 mg total) by mouth daily., Disp: 90 tablet, Rfl: 3   Semaglutide,0.25 or 0.5MG /DOS, (OZEMPIC, 0.25 OR 0.5 MG/DOSE,) 2 MG/3ML SOPN, Inject 0.25mg  subcutaneously once weekly for 4 weeks, then increase to 0.5mg  once weekly, Disp: 3 mL, Rfl: 1   torsemide (DEMADEX) 20 MG tablet, TAKE 1 TABLET(20 MG) BY MOUTH TWICE DAILY, Disp: 60 tablet, Rfl: 3   Vitamin D, Ergocalciferol, (DRISDOL) 1.25 MG (50000 UNIT) CAPS capsule, TAKE 1 CAPSULE BY MOUTH EVERY 7 DAYS FOR 8 DOSES (Patient not taking: Reported on 11/21/2021), Disp: 8 capsule, Rfl: 0  Past Medical History: Past Medical History:  Diagnosis Date   Coronary artery disease    CVA (cerebral vascular accident) (HCC) 09/2020   Diabetes mellitus without complication (HCC)    Diabetic neuropathy (HCC) 02/2020   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   Hyperlipidemia age 30   Hypertension age 21   Hypocalcemia 11/2019   Hypokalemia 11/2019   Iron deficiency anemia    NSTEMI (non-ST elevated myocardial infarction) (HCC) 09/2020   Obesity    Proteinuria 12/09/2019   Pulmonary embolism (HCC) 09/2020    Tobacco Use: Social History   Tobacco Use  Smoking Status Never  Smokeless Tobacco Never    Labs: Review Flowsheet        Latest Ref Rng & Units 10/27/2020 12/14/2020 05/20/2021 08/20/2021  Labs for  ITP Cardiac and Pulmonary Rehab  Cholestrol 100 - 199 mg/dL   952     LDL (calc) 0 - 99 mg/dL   841     HDL-C >32 mg/dL   33     Trlycerides 0 - 149 mg/dL   59     Hemoglobin G4W 5.7 - 6.4 %   5.9   6.4     6.4     6.4     6.4    TCO2 22 - 32 mmol/L  24      O2 Saturation % 59.6          10/29/2021  Labs for ITP Cardiac and Pulmonary Rehab  Cholestrol 140    LDL (calc) 86    HDL-C 42    Trlycerides 58    Hemoglobin A1c   TCO2   O2 Saturation       Multiple values from one day are sorted in reverse-chronological order        Capillary Blood Glucose: Lab Results  Component Value Date   GLUCAP 130  (H) 12/16/2021   GLUCAP 250 (H) 12/04/2021   GLUCAP 205 (H) 12/04/2021   GLUCAP 203 (H) 12/02/2021   GLUCAP 161 (H) 12/02/2021     Exercise Target Goals: Exercise Program Goal: Individual exercise prescription set using results from initial 6 min walk test and THRR while considering  patient's activity barriers and safety.   Exercise Prescription Goal: Initial exercise prescription builds to 30-45 minutes a day of aerobic activity, 2-3 days per week.  Home exercise guidelines will be given to patient during program as part of exercise prescription that the participant will acknowledge.  Activity Barriers & Risk Stratification:  Activity Barriers & Cardiac Risk Stratification - 11/28/21 1201       Activity Barriers & Cardiac Risk Stratification   Activity Barriers Balance Concerns;Deconditioning    Cardiac Risk Stratification High             6 Minute Walk:  6 Minute Walk     Row Name 11/28/21 1158         6 Minute Walk   Phase Initial     Distance 1318 feet     Walk Time 6 minutes     # of Rest Breaks 0     MPH 2.5     METS 3.71     RPE 9     Perceived Dyspnea  0     VO2 Peak 12.97     Symptoms Yes (comment)     Comments Bilateral leg tightness     Resting HR 76 bpm     Resting BP 120/80     Resting Oxygen Saturation  100 %     Exercise Oxygen Saturation  during 6 min walk 100 %     Max Ex. HR 120 bpm     Max Ex. BP 142/82     2 Minute Post BP 122/82              Oxygen Initial Assessment:   Oxygen Re-Evaluation:   Oxygen Discharge (Final Oxygen Re-Evaluation):   Initial Exercise Prescription:  Initial Exercise Prescription - 11/28/21 1200       Date of Initial Exercise RX and Referring Provider   Date 11/28/21    Referring Provider Dr. Alverda Skeans, MD    Expected Discharge Date 01/24/22      NuStep   Level 1    SPM 60    Minutes 15  METs 1.8      Track   Laps 11    Minutes 15    METs 2.28      Prescription Details    Frequency (times per week) 3    Duration Progress to 30 minutes of continuous aerobic without signs/symptoms of physical distress      Intensity   THRR 40-80% of Max Heartrate 70-140    Ratings of Perceived Exertion 11-13    Perceived Dyspnea 0-4      Progression   Progression Continue progressive overload as per policy without signs/symptoms or physical distress.      Resistance Training   Training Prescription Yes    Weight 3    Reps 10-15             Perform Capillary Blood Glucose checks as needed.  Exercise Prescription Changes:   Exercise Prescription Changes     Row Name 12/02/21 1027 12/16/21 1037           Response to Exercise   Blood Pressure (Admit) 132/80 112/70      Blood Pressure (Exercise) 150/84 126/68      Blood Pressure (Exit) 122/82 108/70      Heart Rate (Admit) 81 bpm 85 bpm      Heart Rate (Exercise) 125 bpm 112 bpm      Heart Rate (Exit) 91 bpm 88 bpm      Rating of Perceived Exertion (Exercise) 12 11      Symptoms None None      Comments Off to a good start with exercise. --      Duration Continue with 30 min of aerobic exercise without signs/symptoms of physical distress. Continue with 30 min of aerobic exercise without signs/symptoms of physical distress.      Intensity THRR unchanged THRR unchanged        Progression   Progression Continue to progress workloads to maintain intensity without signs/symptoms of physical distress. Continue to progress workloads to maintain intensity without signs/symptoms of physical distress.      Average METs 2.1 2.2        Resistance Training   Training Prescription Yes Yes      Weight 3 3      Reps 10-15 10-15      Time 10 Minutes 10 Minutes        Interval Training   Interval Training No No        NuStep   Level 1 1      SPM 85 85      Minutes 15 25      METs 2 2.2        Track   Laps 11 --      Minutes 15 --      METs 2.28 --               Exercise Comments:   Exercise Comments      Row Name 12/02/21 1134 12/04/21 1050 12/09/21 1050       Exercise Comments Patient tolerated 1st session of exercise well without symptoms. Reviewed goals with patient. Error              Exercise Goals and Review:   Exercise Goals     Row Name 11/28/21 1204             Exercise Goals   Increase Physical Activity Yes       Intervention Provide advice, education, support and counseling about physical activity/exercise needs.;Develop an individualized  exercise prescription for aerobic and resistive training based on initial evaluation findings, risk stratification, comorbidities and participant's personal goals.       Expected Outcomes Short Term: Attend rehab on a regular basis to increase amount of physical activity.;Long Term: Add in home exercise to make exercise part of routine and to increase amount of physical activity.;Long Term: Exercising regularly at least 3-5 days a week.       Increase Strength and Stamina Yes       Intervention Provide advice, education, support and counseling about physical activity/exercise needs.;Develop an individualized exercise prescription for aerobic and resistive training based on initial evaluation findings, risk stratification, comorbidities and participant's personal goals.       Expected Outcomes Short Term: Increase workloads from initial exercise prescription for resistance, speed, and METs.;Short Term: Perform resistance training exercises routinely during rehab and add in resistance training at home;Long Term: Improve cardiorespiratory fitness, muscular endurance and strength as measured by increased METs and functional capacity ( )       Able to understand and use rate of perceived exertion (RPE) scale Yes       Intervention Provide education and explanation on how to use RPE scale       Expected Outcomes Short Term: Able to use RPE daily in rehab to express subjective intensity level;Long Term:  Able to use RPE to guide intensity  level when exercising independently       Knowledge and understanding of Target Heart Rate Range (THRR) Yes       Intervention Provide education and explanation of THRR including how the numbers were predicted and where they are located for reference       Expected Outcomes Short Term: Able to state/look up THRR;Long Term: Able to use THRR to govern intensity when exercising independently;Short Term: Able to use daily as guideline for intensity in rehab       Understanding of Exercise Prescription Yes       Intervention Provide education, explanation, and written materials on patient's individual exercise prescription       Expected Outcomes Short Term: Able to explain program exercise prescription;Long Term: Able to explain home exercise prescription to exercise independently                Exercise Goals Re-Evaluation :  Exercise Goals Re-Evaluation     Row Name 12/02/21 1134 12/04/21 1050           Exercise Goal Re-Evaluation   Exercise Goals Review Increase Physical Activity;Able to understand and use rate of perceived exertion (RPE) scale Increase Physical Activity;Able to understand and use rate of perceived exertion (RPE) scale      Comments Patient able to understand and use RPE scale appropriately. Patient has neuropathy and ankle pain which limits her mobility. Discussed switching equipment/ exercise prescription at cardiac rehab, but patient will continue current routine for now.      Expected Outcomes Progress workloads as tolerated to help improve cardiorespiratory fitness. Continue to progress workloads as tolerated.               Discharge Exercise Prescription (Final Exercise Prescription Changes):  Exercise Prescription Changes - 12/16/21 1037       Response to Exercise   Blood Pressure (Admit) 112/70    Blood Pressure (Exercise) 126/68    Blood Pressure (Exit) 108/70    Heart Rate (Admit) 85 bpm    Heart Rate (Exercise) 112 bpm    Heart Rate (Exit) 88 bpm  Rating of Perceived Exertion (Exercise) 11    Symptoms None    Duration Continue with 30 min of aerobic exercise without signs/symptoms of physical distress.    Intensity THRR unchanged      Progression   Progression Continue to progress workloads to maintain intensity without signs/symptoms of physical distress.    Average METs 2.2      Resistance Training   Training Prescription Yes    Weight 3    Reps 10-15    Time 10 Minutes      Interval Training   Interval Training No      NuStep   Level 1    SPM 85    Minutes 25    METs 2.2             Nutrition:  Target Goals: Understanding of nutrition guidelines, daily intake of sodium 1500mg , cholesterol 200mg , calories 30% from fat and 7% or less from saturated fats, daily to have 5 or more servings of fruits and vegetables.  Biometrics:   Post Biometrics - 11/28/21 1206        Post  Biometrics   Waist Circumference 56 inches    Hip Circumference 54.5 inches    Waist to Hip Ratio 1.03 %    Triceps Skinfold 47 mm    % Body Fat 55.7 %    Grip Strength 32 kg    Flexibility 0 in   Pt able to touch the dial, but not move it.   Single Leg Stand 18.6 seconds             Nutrition Therapy Plan and Nutrition Goals:  Nutrition Therapy & Goals - 12/02/21 1213       Nutrition Therapy   Diet Heart Healthy/Carbohydrate consistent    Drug/Food Interactions Statins/Certain Fruits      Personal Nutrition Goals   Nutrition Goal Patient to describe the benefit off including fruits, vegetables, whole grains, lean protein/plant protein, low fat dairy products as part of a heart healthy meal plan.    Personal Goal #2 Patient to identify and limit refined carbohydrates, simples sugars, sodium.      Intervention Plan   Intervention Prescribe, educate and counsel regarding individualized specific dietary modifications aiming towards targeted core components such as weight, hypertension, lipid management, diabetes, heart  failure and other comorbidities.;Nutrition handout(s) given to patient.    Expected Outcomes Short Term Goal: Understand basic principles of dietary content, such as calories, fat, sodium, cholesterol and nutrients.;Long Term Goal: Adherence to prescribed nutrition plan.             Nutrition Assessments:  MEDIFICTS Score Key: ?70 Need to make dietary changes  40-70 Heart Healthy Diet ? 40 Therapeutic Level Cholesterol Diet    Picture Your Plate Scores: <29 Unhealthy dietary pattern with much room for improvement. 41-50 Dietary pattern unlikely to meet recommendations for good health and room for improvement. 51-60 More healthful dietary pattern, with some room for improvement.  >60 Healthy dietary pattern, although there may be some specific behaviors that could be improved.    Nutrition Goals Re-Evaluation:   Nutrition Goals Re-Evaluation:   Nutrition Goals Discharge (Final Nutrition Goals Re-Evaluation):   Psychosocial: Target Goals: Acknowledge presence or absence of significant depression and/or stress, maximize coping skills, provide positive support system. Participant is able to verbalize types and ability to use techniques and skills needed for reducing stress and depression.  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 Tiffany Le has a good support network     Barriers   Psychosocial barriers to participate in program The patient should benefit from training in stress management and relaxation.      Screening Interventions   Interventions Encouraged to exercise;To provide support and resources with identified psychosocial needs;Provide  feedback about the scores to participant    Expected Outcomes Long Term Goal: Stressors or current issues are controlled or eliminated.;Long Term goal: The participant improves quality of Life and PHQ9 Scores as seen by post scores and/or verbalization of changes;Short Term goal: Identification and review with participant of any Quality of Life or Depression concerns found by scoring the questionnaire.             Quality of Life Scores:  Quality of Life - 11/28/21 1253       Quality of Life   Select Quality of Life      Quality of Life Scores   Health/Function Pre 17.07 %    Socioeconomic Pre 18.71 %    Psych/Spiritual Pre 19.17 %    Family Pre 22.8 %    GLOBAL Pre 18.67 %            Scores of 19 and below usually indicate a poorer quality of life in these areas.  A difference of  2-3 points is a clinically meaningful difference.  A difference of 2-3 points in the total score of the Quality of Life Index has been associated with significant improvement in overall quality of life, self-image, physical symptoms, and general health in studies assessing change in quality of life.  PHQ-9: Review Flowsheet        11/28/2021 10/18/2021 08/20/2021 05/20/2021 01/08/2021  Depression screen PHQ 2/9  Decreased Interest 0 0 3 0 0  Down, Depressed, Hopeless 0 0 3 0 1  PHQ - 2 Score 0 0 6 0 1  Altered sleeping   1    Tired, decreased energy   3    Change in appetite   0    Feeling bad or failure about yourself    0    Trouble concentrating   0    Moving slowly or fidgety/restless   0    Suicidal thoughts   0    PHQ-9 Score   10    Difficult doing work/chores   Somewhat difficult        Multiple values from one day are sorted in reverse-chronological order       Interpretation of Total Score  Total Score Depression Severity:  1-4 = Minimal depression, 5-9 = Mild depression, 10-14 = Moderate depression, 15-19 = Moderately severe depression, 20-27 = Severe depression    Psychosocial Evaluation and Intervention:   Psychosocial Re-Evaluation:  Psychosocial Re-Evaluation     Row Name 12/04/21 1208 12/17/21 0753           Psychosocial Re-Evaluation   Current issues with History of Depression;Current Stress Concerns History of Depression;Current Stress Concerns      Comments Reviewed quality of life questionnare with patient. Forwarded to primary care provider. Tiffany Le denies being depressed currently. Tiffany Le continues to have stress concerns regarding her job as she works almost  every day. Tiffany Le hopes to receive disability benefits in the near future so she can cut back her hours.Tiffany Le denies being depressed currently. Saida's primary care provider will discuss her QOL at her follow up in June.      Expected Outcomes Kyanna will have decreased stressors upon completion of phase 2 cardiac rehab. Tiffany Le will have decreased stressors upon completion of phase 2 cardiac rehab.      Interventions Stress management education;Relaxation education;Encouraged to attend Cardiac Rehabilitation for the exercise Stress management education;Relaxation education;Encouraged to attend Cardiac Rehabilitation for the exercise      Continue Psychosocial Services  Follow up required by staff Follow up required by staff        Initial Review   Source of Stress Concerns Chronic Illness;Unable to participate in former interests or hobbies;Occupation;Unable to perform yard/household activities Chronic Illness;Unable to participate in former interests or hobbies;Occupation;Unable to perform yard/household activities      Comments Will continue to monitor and offer support as needed. Will continue to monitor and offer support as needed.               Psychosocial Discharge (Final Psychosocial Re-Evaluation):  Psychosocial Re-Evaluation - 12/17/21 0753       Psychosocial Re-Evaluation   Current issues with History of Depression;Current Stress Concerns    Comments  Delicia continues to have stress concerns regarding her job as she works almost every day. Nicola hopes to receive disability benefits in the near future so she can cut back her hours.Tiffany Le denies being depressed currently. Tahira's primary care provider will discuss her QOL at her follow up in June.    Expected Outcomes Jerene will have decreased stressors upon completion of phase 2 cardiac rehab.    Interventions Stress management education;Relaxation education;Encouraged to attend Cardiac Rehabilitation for the exercise    Continue Psychosocial Services  Follow up required by staff      Initial Review   Source of Stress Concerns Chronic Illness;Unable to participate in former interests or hobbies;Occupation;Unable to perform yard/household activities    Comments Will continue to monitor and offer support as needed.             Vocational Rehabilitation: Provide vocational rehab assistance to qualifying candidates.   Vocational Rehab Evaluation & Intervention:  Vocational Rehab - 11/28/21 1248       Initial Vocational Rehab Evaluation & Intervention   Assessment shows need for Vocational Rehabilitation No   Tiffany Le works full time and does not need vocational rehab at this time            Education: Education Goals: Education classes will be provided on a weekly basis, covering required topics. Participant will state understanding/return demonstration of topics presented.  Learning Barriers/Preferences:  Learning Barriers/Preferences - 11/28/21 1253       Learning Barriers/Preferences   Learning Barriers Sight;Exercise Concerns   Pt dizzy at times, visually impaired, wears glasses   Learning Preferences Group Instruction;Individual Instruction;Pictoral;Skilled Demonstration;Video;Written Material;Computer/Internet             Education Topics: Count Your Pulse:  -Group instruction provided by verbal instruction, demonstration, patient participation and  written materials to support subject.  Instructors address importance of being able to find your pulse and how to count your pulse when at home without a heart monitor.  Patients get hands on experience counting their pulse with staff help and individually.   Heart Attack, Angina, and Risk Factor Modification:  -Group instruction provided by verbal instruction, video, and written  materials to support subject.  Instructors address signs and symptoms of angina and heart attacks.    Also discuss risk factors for heart disease and how to make changes to improve heart health risk factors.   Functional Fitness:  -Group instruction provided by verbal instruction, demonstration, patient participation, and written materials to support subject.  Instructors address safety measures for doing things around the house.  Discuss how to get up and down off the floor, how to pick things up properly, how to safely get out of a chair without assistance, and balance training.   Meditation and Mindfulness:  -Group instruction provided by verbal instruction, patient participation, and written materials to support subject.  Instructor addresses importance of mindfulness and meditation practice to help reduce stress and improve awareness.  Instructor also leads participants through a meditation exercise.    Stretching for Flexibility and Mobility:  -Group instruction provided by verbal instruction, patient participation, and written materials to support subject.  Instructors lead participants through series of stretches that are designed to increase flexibility thus improving mobility.  These stretches are additional exercise for major muscle groups that are typically performed during regular warm up and cool down.   Hands Only CPR:  -Group verbal, video, and participation provides a basic overview of AHA guidelines for community CPR. Role-play of emergencies allow participants the opportunity to practice calling for  help and chest compression technique with discussion of AED use.   Hypertension: -Group verbal and written instruction that provides a basic overview of hypertension including the most recent diagnostic guidelines, risk factor reduction with self-care instructions and medication management.    Nutrition I class: Heart Healthy Eating:  -Group instruction provided by PowerPoint slides, verbal discussion, and written materials to support subject matter. The instructor gives an explanation and review of the Therapeutic Lifestyle Changes diet recommendations, which includes a discussion on lipid goals, dietary fat, sodium, fiber, plant stanol/sterol esters, sugar, and the components of a well-balanced, healthy diet.   Nutrition II class: Lifestyle Skills:  -Group instruction provided by PowerPoint slides, verbal discussion, and written materials to support subject matter. The instructor gives an explanation and review of label reading, grocery shopping for heart health, heart healthy recipe modifications, and ways to make healthier choices when eating out.   Diabetes Question & Answer:  -Group instruction provided by PowerPoint slides, verbal discussion, and written materials to support subject matter. The instructor gives an explanation and review of diabetes co-morbidities, pre- and post-prandial blood glucose goals, pre-exercise blood glucose goals, signs, symptoms, and treatment of hypoglycemia and hyperglycemia, and foot care basics.   Diabetes Blitz:  -Group instruction provided by PowerPoint slides, verbal discussion, and written materials to support subject matter. The instructor gives an explanation and review of the physiology behind type 1 and type 2 diabetes, diabetes medications and rational behind using different medications, pre- and post-prandial blood glucose recommendations and Hemoglobin A1c goals, diabetes diet, and exercise including blood glucose guidelines for exercising  safely.    Portion Distortion:  -Group instruction provided by PowerPoint slides, verbal discussion, written materials, and food models to support subject matter. The instructor gives an explanation of serving size versus portion size, changes in portions sizes over the last 20 years, and what consists of a serving from each food group.   Stress Management:  -Group instruction provided by verbal instruction, video, and written materials to support subject matter.  Instructors review role of stress in heart disease and how to cope with stress positively.  Exercising on Your Own:  -Group instruction provided by verbal instruction, power point, and written materials to support subject.  Instructors discuss benefits of exercise, components of exercise, frequency and intensity of exercise, and end points for exercise.  Also discuss use of nitroglycerin and activating EMS.  Review options of places to exercise outside of rehab.  Review guidelines for sex with heart disease.   Cardiac Drugs I:  -Group instruction provided by verbal instruction and written materials to support subject.  Instructor reviews cardiac drug classes: antiplatelets, anticoagulants, beta blockers, and statins.  Instructor discusses reasons, side effects, and lifestyle considerations for each drug class.   Cardiac Drugs II:  -Group instruction provided by verbal instruction and written materials to support subject.  Instructor reviews cardiac drug classes: angiotensin converting enzyme inhibitors (ACE-I), angiotensin II receptor blockers (ARBs), nitrates, and calcium channel blockers.  Instructor discusses reasons, side effects, and lifestyle considerations for each drug class.   Anatomy and Physiology of the Circulatory System:  Group verbal and written instruction and models provide basic cardiac anatomy and physiology, with the coronary electrical and arterial systems. Review of: AMI, Angina, Valve disease, Heart  Failure, Peripheral Artery Disease, Cardiac Arrhythmia, Pacemakers, and the ICD.   Other Education:  -Group or individual verbal, written, or video instructions that support the educational goals of the cardiac rehab program.   Holiday Eating Survival Tips:  -Group instruction provided by PowerPoint slides, verbal discussion, and written materials to support subject matter. The instructor gives patients tips, tricks, and techniques to help them not only survive but enjoy the holidays despite the onslaught of food that accompanies the holidays.   Knowledge Questionnaire Score:  Knowledge Questionnaire Score - 11/28/21 1256       Knowledge Questionnaire Score   Pre Score 23/24             Core Components/Risk Factors/Patient Goals at Admission:  Personal Goals and Risk Factors at Admission - 11/28/21 1259       Core Components/Risk Factors/Patient Goals on Admission    Weight Management Yes;Obesity;Weight Loss    Intervention Weight Management: Develop a combined nutrition and exercise program designed to reach desired caloric intake, while maintaining appropriate intake of nutrient and fiber, sodium and fats, and appropriate energy expenditure required for the weight goal.;Weight Management: Provide education and appropriate resources to help participant work on and attain dietary goals.;Weight Management/Obesity: Establish reasonable short term and long term weight goals.;Obesity: Provide education and appropriate resources to help participant work on and attain dietary goals.    Expected Outcomes Short Term: Continue to assess and modify interventions until short term weight is achieved;Long Term: Adherence to nutrition and physical activity/exercise program aimed toward attainment of established weight goal;Weight Loss: Understanding of general recommendations for a balanced deficit meal plan, which promotes 1-2 lb weight loss per week and includes a negative energy balance of  [phone] kcal/d;Understanding recommendations for meals to include 15-35% energy as protein, 25-35% energy from fat, 35-60% energy from carbohydrates, less than 200mg  of dietary cholesterol, 20-35 gm of total fiber daily;Understanding of distribution of calorie intake throughout the day with the consumption of 4-5 meals/snacks    Diabetes Yes    Intervention Provide education about signs/symptoms and action to take for hypo/hyperglycemia.;Provide education about proper nutrition, including hydration, and aerobic/resistive exercise prescription along with prescribed medications to achieve blood glucose in normal ranges: Fasting glucose 65-99 mg/dL    Expected Outcomes Short Term: Participant verbalizes understanding of the signs/symptoms and immediate care of hyper/hypoglycemia,  proper foot care and importance of medication, aerobic/resistive exercise and nutrition plan for blood glucose control.;Long Term: Attainment of HbA1C < 7%.    Hypertension Yes    Intervention Provide education on lifestyle modifcations including regular physical activity/exercise, weight management, moderate sodium restriction and increased consumption of fresh fruit, vegetables, and low fat dairy, alcohol moderation, and smoking cessation.;Monitor prescription use compliance.    Expected Outcomes Short Term: Continued assessment and intervention until BP is < 140/33mm HG in hypertensive participants. < 130/65mm HG in hypertensive participants with diabetes, heart failure or chronic kidney disease.;Long Term: Maintenance of blood pressure at goal levels.    Lipids Yes    Intervention Provide education and support for participant on nutrition & aerobic/resistive exercise along with prescribed medications to achieve LDL 70mg , HDL >40mg .    Expected Outcomes Short Term: Participant states understanding of desired cholesterol values and is compliant with medications prescribed. Participant is following exercise prescription and  nutrition guidelines.;Long Term: Cholesterol controlled with medications as prescribed, with individualized exercise RX and with personalized nutrition plan. Value goals: LDL < 70mg , HDL > 40 mg.    Stress Yes    Intervention Offer individual and/or small group education and counseling on adjustment to heart disease, stress management and health-related lifestyle change. Teach and support self-help strategies.;Refer participants experiencing significant psychosocial distress to appropriate mental health specialists for further evaluation and treatment. When possible, include family members and significant others in education/counseling sessions.    Expected Outcomes Short Term: Participant demonstrates changes in health-related behavior, relaxation and other stress management skills, ability to obtain effective social support, and compliance with psychotropic medications if prescribed.;Long Term: Emotional wellbeing is indicated by absence of clinically significant psychosocial distress or social isolation.    Personal Goal Other Yes    Personal Goal Short term: get moving, know what to do, be healthy Long term: flexibility and strength    Intervention Will continue to monitor pt and progress workloads as tolerated without sign or symptom    Expected Outcomes Pt will achieve her goals and create a healthy lifestyle             Core Components/Risk Factors/Patient Goals Review:   Goals and Risk Factor Review     Row Name 12/04/21 1210 12/17/21 0757           Core Components/Risk Factors/Patient Goals Review   Personal Goals Review Weight Management/Obesity;Stress;Diabetes;Lipids;Hypertension Weight Management/Obesity;Stress;Diabetes;Lipids;Hypertension      Review Tiffany Le started cardiac rehab on 12/04/21 and did well with exercise. vital signs stable. CBG's varied from the 160's to 200's forwarded to primary care Tiffany Le has good attendance and participaiton in phase 2 cardiac rehab.  Tiffany Le. vital signs and CBG's have been stable.      Expected Outcomes Tiffany Le will continue to participate in phase 2 cardaic rehab for exercise, nutrition and lifestyle modifications. Tiffany Le will continue to participate in phase 2 cardaic rehab for exercise, nutrition and lifestyle modifications.               Core Components/Risk Factors/Patient Goals at Discharge (Final Review):   Goals and Risk Factor Review - 12/17/21 0757       Core Components/Risk Factors/Patient Goals Review   Personal Goals Review Weight Management/Obesity;Stress;Diabetes;Lipids;Hypertension    Review Tiffany Le has good attendance and participaiton in phase 2 cardiac rehab. Tiffany Le's. vital signs and CBG's have been stable.    Expected Outcomes Tiffany Le will continue to participate in phase 2 cardaic rehab for exercise, nutrition and lifestyle modifications.  ITP Comments:  ITP Comments     Row Name 11/28/21 1128 12/04/21 1207 12/17/21 0751       ITP Comments Dr Armanda Magic MD, Medical Director 30 Day ITP Review. Dessa Phi started cardaic rehab on 12/02/21 and did well with exercise 30 Day ITP Review. Dessa Phi has good participation and fair attendance in phase 2 cardiac rehab.              Comments: See ITP Comments

## 2021-12-18 ENCOUNTER — Encounter (HOSPITAL_COMMUNITY)
Admission: RE | Admit: 2021-12-18 | Discharge: 2021-12-18 | Disposition: A | Discharge status: Home / Self Care | Payer: Medicaid Other | Source: Ambulatory Visit | Attending: Internal Medicine | Admitting: Internal Medicine

## 2021-12-18 DIAGNOSIS — I429 Cardiomyopathy, unspecified: Secondary | ICD-10-CM

## 2021-12-19 IMAGING — MR MR HEAD WO/W CM
7 of 13 series · 21 of 48 positions shown · IV contrast (Yes GAD)
Comparison: None available.

CLINICAL DATA: Initial evaluation for dizziness, headache. History
of recent CABG.

EXAM:
MRI HEAD WITHOUT AND WITH CONTRAST
TECHNIQUE: Multiplanar, multiecho pulse sequences of the brain and surrounding
structures were obtained without and with intravenous contrast.
CONTRAST:  11mL GADAVIST GADOBUTROL 1 MMOL/ML IV SOLN

[Series 2: DWI · coronal · 4.0mm · 0.94mm/px · 4 of 74 slices shown (1 of 2)]
[im 1/74]
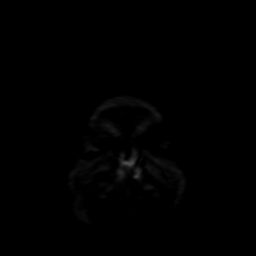
[im 25/74]
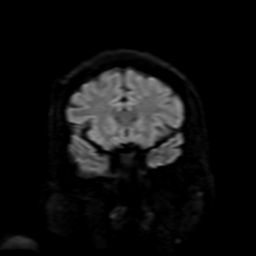
[im 49/74]
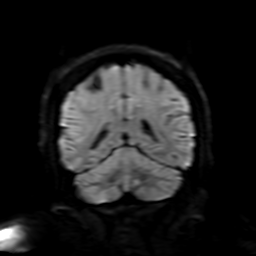
[im 74/74]
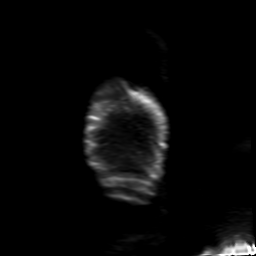

[Series 3: FLAIR · sagittal · 5.0mm · 0.23mm/px · 2 of 25 slices shown (1 of 2)]
[im 1/25]
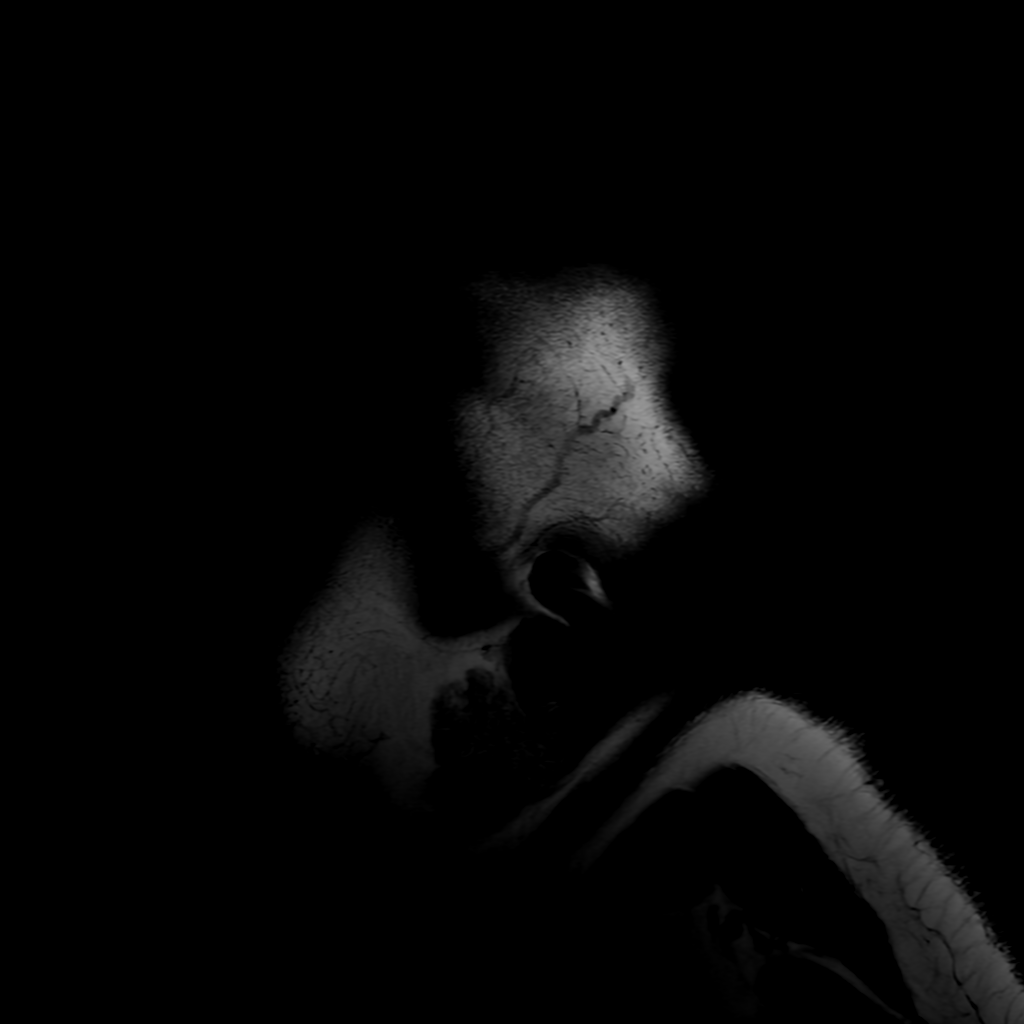
[im 25/25]
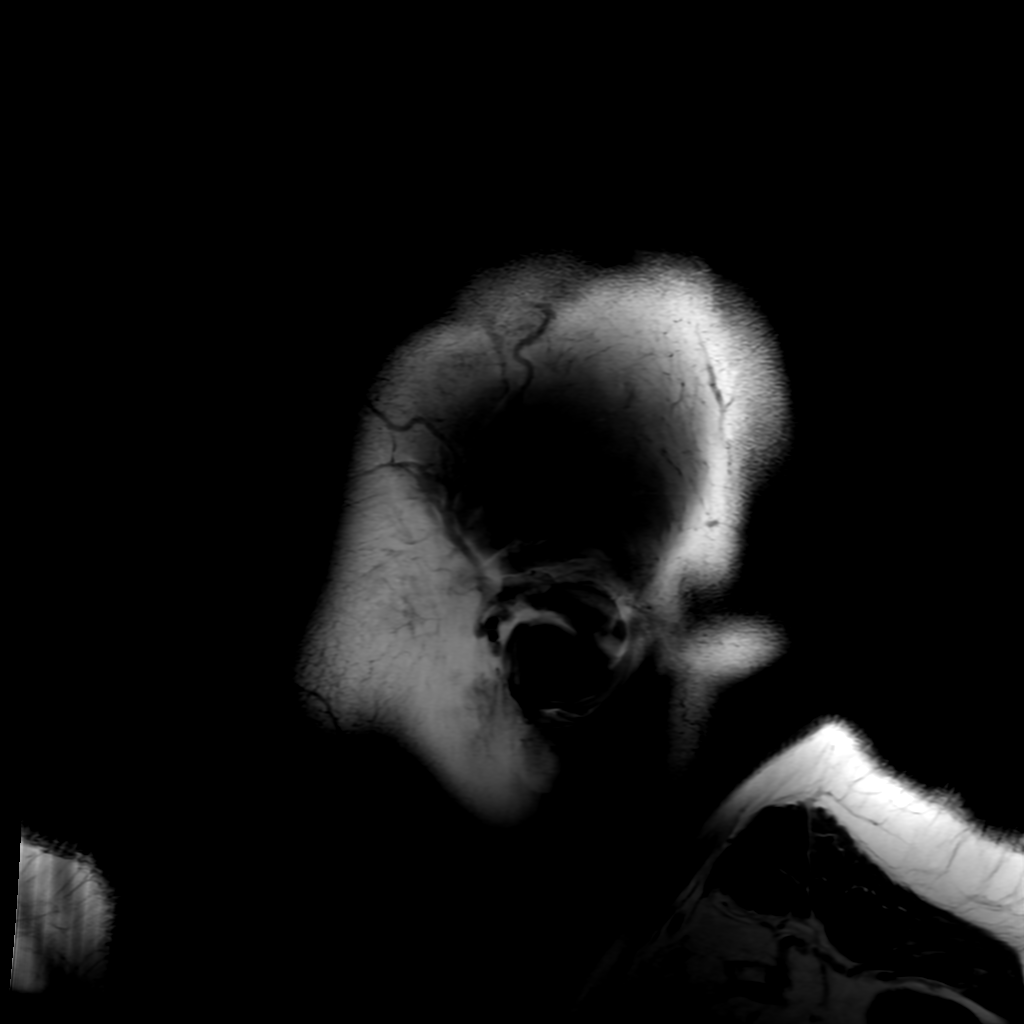

[Series 4: T2 · axial · 5.0mm · 0.23mm/px · 1 of 26 slices shown]
[im 1/26]
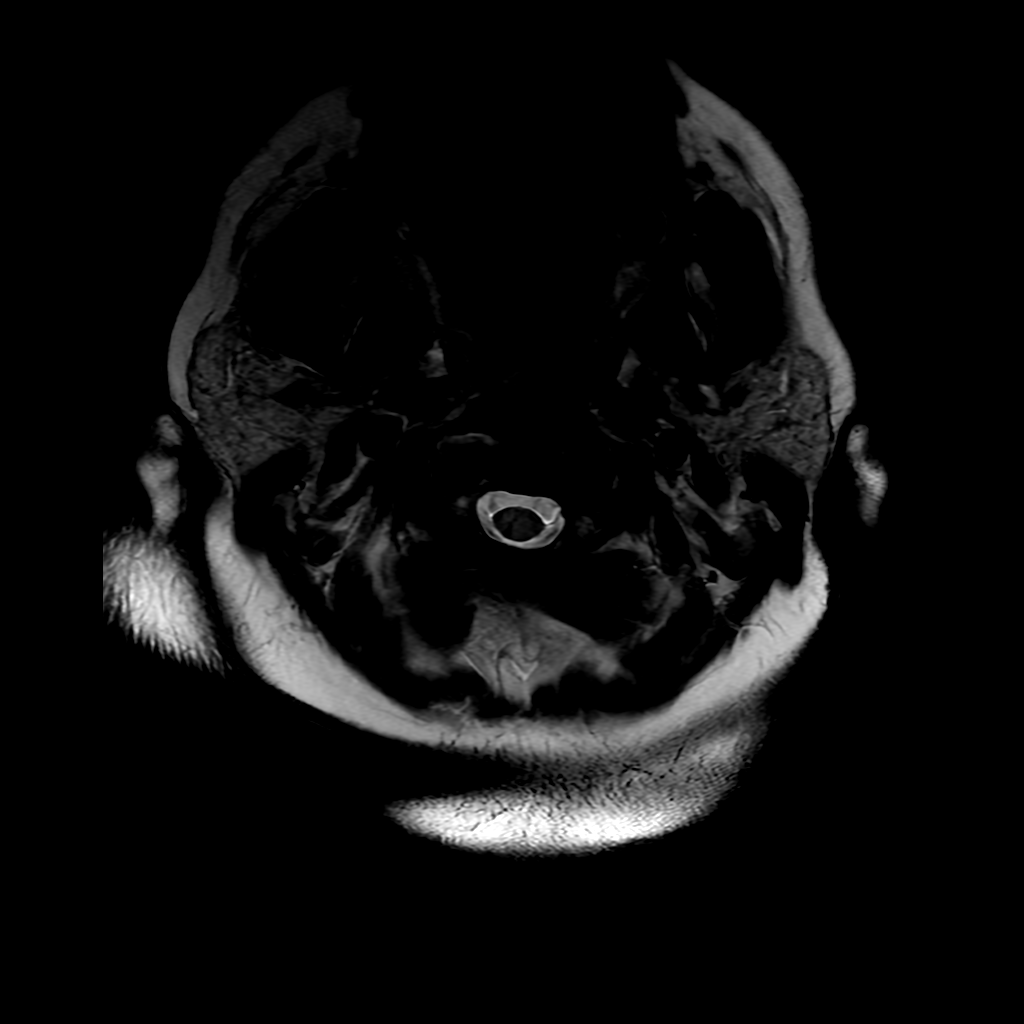

[Series 5: FLAIR · axial · 3.0mm · 0.45mm/px · z∈[-97,+48]mm · 2 of 26 slices shown (2 of 2)]
[im 1/26]
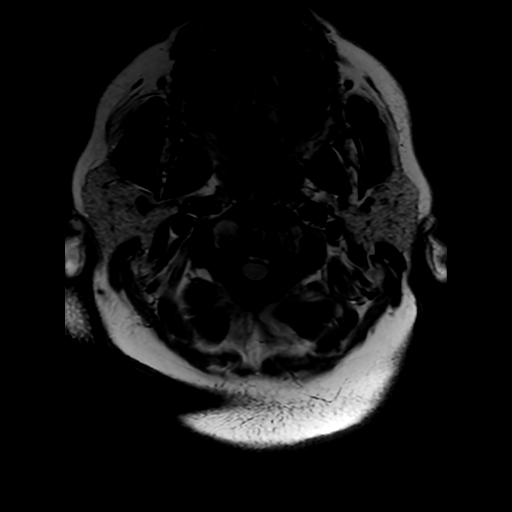
[im 26/26]
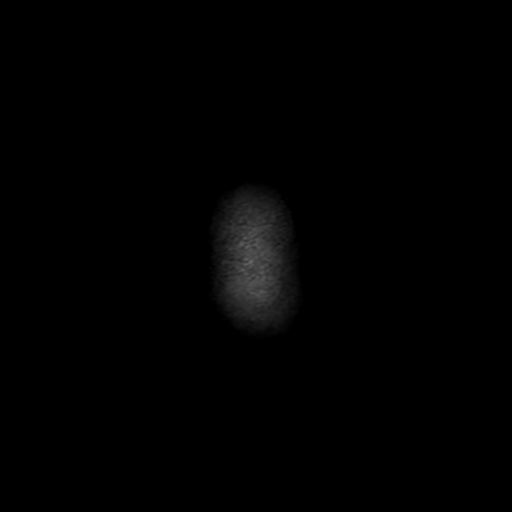

[Series 7: DWI · axial · 3.0mm · 0.94mm/px · z∈[-98,+45]mm · 7 of 100 slices shown (2 of 2)]
[im 1/100]
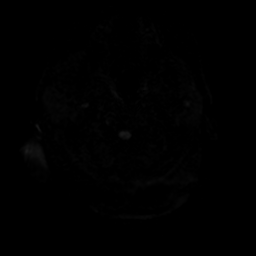
[im 17/100]
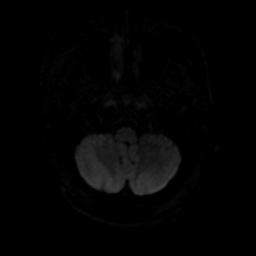
[im 34/100]
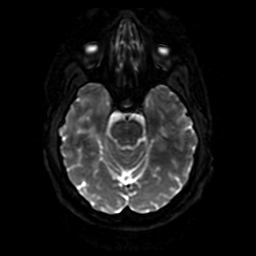
[im 50/100]
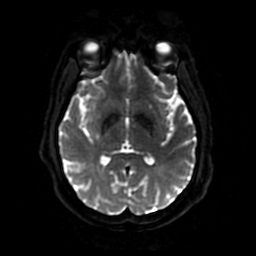
[im 67/100]
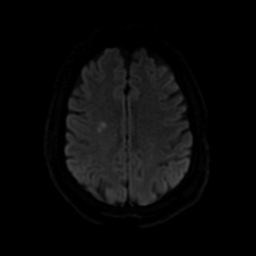
[im 83/100]
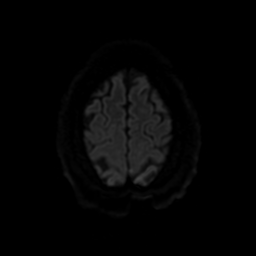
[im 100/100]
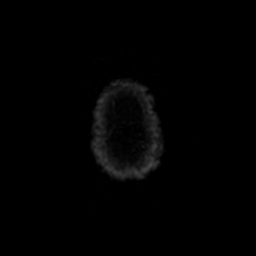

[Series 250: ADC · coronal · 4.0mm · 0.94mm/px · 2 of 37 slices shown (1 of 2)]
[im 1/37]
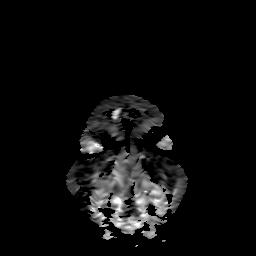
[im 37/37]
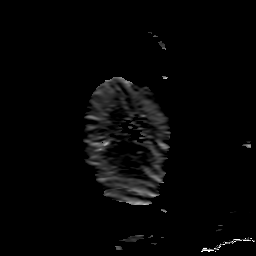

[Series 750: ADC · axial · 3.0mm · 0.94mm/px · z∈[-98,+45]mm · 3 of 50 slices shown (2 of 2)]
[im 1/50]
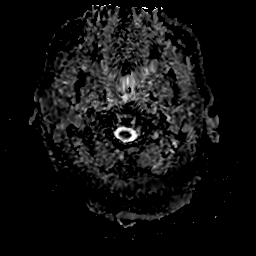
[im 25/50]
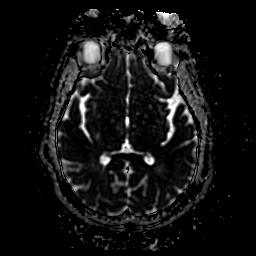
[im 50/50]
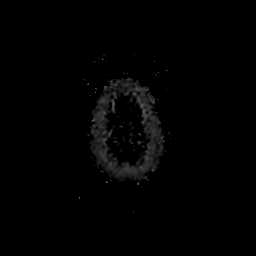

[21 of 48 positions shown; findings below may reference images not displayed]

FINDINGS: Brain: Examination degraded by motion artifact.

Cerebral volume within normal limits for age. Minimal hazy T2/FLAIR
hyperintensity noted within the periventricular white matter,
nonspecific, but most like related to mild chronic microvascular
ischemic disease. Superimposed tiny remote lacunar infarct noted at
the right thalamus.

1.3 cm area of patchy diffusion abnormality seen involving the deep
white matter of the right frontal centrum semi ovale/corona radiata
(series 7, image 32). Additional 7 mm focus of diffusion abnormality
noted at the contralateral white matter of the left centrum semi
ovale/corona radiata. Associated minimal patchy post-contrast
enhancement. Additional 8 mm focus seen involving the ventral pons
along the midline (series 7, image 19). Additional tiny
subcentimeter focus of diffusion abnormality noted at the
subcortical anterior left frontal lobe (series 7, image 36).
Findings consistent with acute to early subacute ischemic infarcts.
No associated mass effect or hemorrhage.

Gray-white matter differentiation otherwise maintained. No other
areas of encephalomalacia to suggest chronic cortical infarction. No
other evidence for acute intracranial hemorrhage. Two chronic micro
hemorrhages noted involving the left temporal lobe and left
cerebellum.

No mass lesion, midline shift or mass effect. No hydrocephalus or
extra-axial fluid collection. Pituitary gland suprasellar region
within normal limits. Midline structures intact. No other abnormal
enhancement.

Vascular: Major intracranial vascular flow voids are maintained.

Skull and upper cervical spine: Craniocervical junction within
normal limits. Bone marrow signal intensity normal. No scalp soft
tissue abnormality.

Sinuses/Orbits: Globes and orbital soft tissues demonstrate no acute
finding. Mild scattered mucosal thickening noted within the
frontoethmoidal sinuses. Paranasal sinuses are otherwise clear. No
significant mastoid effusion. Inner ear structures grossly normal.

Other: None.
IMPRESSION: 1. Patchy multifocal areas of diffusion abnormality involving the
bilateral cerebral white matter and pons as above, consistent with
acute to early subacute ischemic infarcts. No associated hemorrhage
or mass effect.
2. Underlying mild chronic microvascular ischemic disease with small
remote lacunar infarct involving the right thalamus.

## 2021-12-20 ENCOUNTER — Encounter (HOSPITAL_COMMUNITY)
Admission: RE | Admit: 2021-12-20 | Discharge: 2021-12-20 | Disposition: A | Discharge status: Home / Self Care | Payer: Medicaid Other | Source: Ambulatory Visit | Attending: Internal Medicine | Admitting: Internal Medicine

## 2021-12-20 VITALS — BP 128/76 | Wt 260.6 lb

## 2021-12-20 DIAGNOSIS — I429 Cardiomyopathy, unspecified: Secondary | ICD-10-CM | POA: Diagnosis not present

## 2021-12-23 ENCOUNTER — Encounter (HOSPITAL_COMMUNITY): Payer: Medicaid Other

## 2021-12-25 ENCOUNTER — Telehealth (HOSPITAL_COMMUNITY): Payer: Self-pay | Admitting: *Deleted

## 2021-12-25 ENCOUNTER — Encounter (HOSPITAL_COMMUNITY): Payer: Medicaid Other

## 2021-12-25 NOTE — Telephone Encounter (Signed)
Called pt to discuss as she should not have fewer needles vs Ozempic doses to use. She states she primed the pen once and then threw out the needle even though she hadn't used it to give herself a dose. Advised her not to do this in the future as the pharmacy will not replace single needles, she'd need to buy a box of 50 or 100. Advised her to get her next refill of Ozempic as there should be an extra 2 needles in that box that she won't need for the 0.'5mg'$  doses in that month supply box. She verbalized understanding. ?

## 2021-12-25 NOTE — Telephone Encounter (Signed)
Left message to cardiac  cardiac rehab.Harrell Gave RN BSN  ?

## 2021-12-27 ENCOUNTER — Encounter (HOSPITAL_COMMUNITY): Payer: Medicaid Other

## 2021-12-30 ENCOUNTER — Encounter (HOSPITAL_COMMUNITY): Payer: Medicaid Other

## 2022-01-01 ENCOUNTER — Encounter (HOSPITAL_COMMUNITY): Payer: Medicaid Other

## 2022-01-03 ENCOUNTER — Encounter (HOSPITAL_COMMUNITY): Payer: Medicaid Other

## 2022-01-03 ENCOUNTER — Other Ambulatory Visit: Payer: Self-pay | Admitting: Nurse Practitioner

## 2022-01-03 ENCOUNTER — Telehealth (HOSPITAL_COMMUNITY): Payer: Self-pay | Admitting: *Deleted

## 2022-01-03 DIAGNOSIS — I10 Essential (primary) hypertension: Secondary | ICD-10-CM

## 2022-01-03 NOTE — Telephone Encounter (Signed)
Left message to call cardiac rehab. Will discharge from cardiac rehab due to nonattendance. Tiffany Le attended 7 exercise sessions between 12/02/21-12/20/21. Tiffany Le did well with exercise when in attendance. Harrell Gave RN BSN

## 2022-01-07 ENCOUNTER — Telehealth: Payer: Self-pay | Admitting: Internal Medicine

## 2022-01-07 ENCOUNTER — Emergency Department (HOSPITAL_COMMUNITY)
Admission: EM | Admit: 2022-01-07 | Discharge: 2022-01-08 | Disposition: A | Discharge status: Home / Self Care | Payer: Medicaid Other | Attending: Emergency Medicine | Admitting: Emergency Medicine

## 2022-01-07 ENCOUNTER — Other Ambulatory Visit: Payer: Self-pay

## 2022-01-07 ENCOUNTER — Encounter (HOSPITAL_COMMUNITY): Payer: Self-pay | Admitting: Emergency Medicine

## 2022-01-07 DIAGNOSIS — R112 Nausea with vomiting, unspecified: Secondary | ICD-10-CM

## 2022-01-07 DIAGNOSIS — D72829 Elevated white blood cell count, unspecified: Secondary | ICD-10-CM | POA: Insufficient documentation

## 2022-01-07 DIAGNOSIS — I2510 Atherosclerotic heart disease of native coronary artery without angina pectoris: Secondary | ICD-10-CM | POA: Insufficient documentation

## 2022-01-07 DIAGNOSIS — R000 Tachycardia, unspecified: Secondary | ICD-10-CM | POA: Diagnosis not present

## 2022-01-07 DIAGNOSIS — N3000 Acute cystitis without hematuria: Secondary | ICD-10-CM

## 2022-01-07 DIAGNOSIS — E1140 Type 2 diabetes mellitus with diabetic neuropathy, unspecified: Secondary | ICD-10-CM | POA: Diagnosis not present

## 2022-01-07 DIAGNOSIS — R1031 Right lower quadrant pain: Secondary | ICD-10-CM | POA: Diagnosis present

## 2022-01-07 DIAGNOSIS — I10 Essential (primary) hypertension: Secondary | ICD-10-CM | POA: Diagnosis not present

## 2022-01-07 DIAGNOSIS — Z951 Presence of aortocoronary bypass graft: Secondary | ICD-10-CM | POA: Diagnosis not present

## 2022-01-07 MED ORDER — ONDANSETRON 4 MG PO TBDP
4.0000 mg | ORAL_TABLET | Freq: Once | ORAL | Status: AC
  Administered 2022-01-07: 4 mg via ORAL
  Filled 2022-01-07: qty 1

## 2022-01-07 NOTE — Telephone Encounter (Signed)
Paged by Ms. Cottone's friend/coworker Engineer, civil (consulting). She reports Ms. Favela has thrown up "at least 5 times" and cannot keep anything down. She is dizzy and has felt like she is going to pass out. Also believes she may have had a fever. I recommend she come to the emergency department for evaluation.

## 2022-01-07 NOTE — ED Provider Triage Note (Signed)
Emergency Medicine Provider Triage Evaluation Note  ADABELLA STANIS , a 46 y.o. female  was evaluated in triage.  Pt complains of abdominal pain x 18:30. RLQ, radiates into right lower back, waxing/waning in severity with associated N/V. Often has problems moving her bowels, had a small BM earlier with no change in pain.   Review of Systems  Positive: Abdominal pain, back pain, n/v Negative: Dysuria, hematuria,vaginal bleeding, chest pain  Physical Exam  BP 125/69 (BP Location: Right Arm)   Pulse (!) 116   Temp 99.7 F (37.6 C) (Oral)   Resp 16   SpO2 98%  Gen:   Awake, no distress   Resp:  Normal effort  MSK:   Moves extremities without difficulty  Other:  No peritoneal signs on abdominal exam.   Medical Decision Making  Medically screening exam initiated at 11:16 PM.  Appropriate orders placed.  MARGA GRAMAJO was informed that the remainder of the evaluation will be completed by another provider, this initial triage assessment does not replace that evaluation, and the importance of remaining in the ED until their evaluation is complete.  Abdominal pain   Amaryllis Dyke, PA-C 01/07/22 2333

## 2022-01-07 NOTE — ED Triage Notes (Signed)
Pt BIB [facility] from work, c/o RLQ pain and vomiting that started at 6:30pm tonight. Has been on ozempic x 4 weeks, had a shot today. Reports similar symptoms when she first started ozempic. EMS VS: 160palp, HR 82, CBG 181

## 2022-01-08 ENCOUNTER — Encounter (HOSPITAL_COMMUNITY): Payer: Medicaid Other

## 2022-01-08 ENCOUNTER — Emergency Department (HOSPITAL_COMMUNITY): Payer: Medicaid Other

## 2022-01-08 LAB — URINALYSIS, ROUTINE W REFLEX MICROSCOPIC
Bilirubin Urine: NEGATIVE
Glucose, UA: >=500 mg/dL — AB
Ketones, ur: NEGATIVE mg/dL
Nitrite: NEGATIVE
Protein, ur: 30 mg/dL — AB
Specific Gravity, Urine: 1.014 (ref 1.005–1.030)
WBC, UA: >50 WBC/hpf — ABNORMAL HIGH (ref 0–5)
pH: 5 (ref 5.0–8.0)

## 2022-01-08 LAB — CBC WITH DIFFERENTIAL/PLATELET
Abs Immature Granulocytes: 0.07 10*3/uL (ref 0.00–0.07)
Basophils Absolute: 0 10*3/uL (ref 0.0–0.1)
Basophils Relative: 0 %
Eosinophils Absolute: 0 10*3/uL (ref 0.0–0.5)
Eosinophils Relative: 0 %
HCT: 37.2 % (ref 36.0–46.0)
Hemoglobin: 11.9 g/dL — ABNORMAL LOW (ref 12.0–15.0)
Immature Granulocytes: 0 %
Lymphocytes Relative: 7 %
Lymphs Abs: 1.2 10*3/uL (ref 0.7–4.0)
MCH: 28.1 pg (ref 26.0–34.0)
MCHC: 32 g/dL (ref 30.0–36.0)
MCV: 87.9 fL (ref 80.0–100.0)
Monocytes Absolute: 1.3 10*3/uL — ABNORMAL HIGH (ref 0.1–1.0)
Monocytes Relative: 7 %
Neutro Abs: 15.2 10*3/uL — ABNORMAL HIGH (ref 1.7–7.7)
Neutrophils Relative %: 86 %
Platelets: 268 10*3/uL (ref 150–400)
RBC: 4.23 MIL/uL (ref 3.87–5.11)
RDW: 12.8 % (ref 11.5–15.5)
WBC: 17.8 10*3/uL — ABNORMAL HIGH (ref 4.0–10.5)
nRBC: 0 % (ref 0.0–0.2)

## 2022-01-08 LAB — I-STAT BETA HCG BLOOD, ED (MC, WL, AP ONLY): I-stat hCG, quantitative: <5 m[IU]/mL (ref <5)

## 2022-01-08 LAB — COMPREHENSIVE METABOLIC PANEL
ALT: 18 U/L (ref 0–44)
AST: 22 U/L (ref 15–41)
Albumin: 4.1 g/dL (ref 3.5–5.0)
Alkaline Phosphatase: 74 U/L (ref 38–126)
Anion gap: 13 (ref 5–15)
BUN: 14 mg/dL (ref 6–20)
CO2: 29 mmol/L (ref 22–32)
Calcium: 9.3 mg/dL (ref 8.9–10.3)
Chloride: 97 mmol/L — ABNORMAL LOW (ref 98–111)
Creatinine, Ser: 1.06 mg/dL — ABNORMAL HIGH (ref 0.44–1.00)
GFR, Estimated: >60 mL/min (ref >60)
Glucose, Bld: 177 mg/dL — ABNORMAL HIGH (ref 70–99)
Potassium: 3.7 mmol/L (ref 3.5–5.1)
Sodium: 139 mmol/L (ref 135–145)
Total Bilirubin: 0.7 mg/dL (ref 0.3–1.2)
Total Protein: 8.3 g/dL — ABNORMAL HIGH (ref 6.5–8.1)

## 2022-01-08 LAB — LIPASE, BLOOD: Lipase: 33 U/L (ref 11–51)

## 2022-01-08 MED ORDER — ONDANSETRON 4 MG PO TBDP: 4.0000 mg | ORAL_TABLET | Freq: Three times a day (TID) | ORAL | 0 refills | Status: DC | PRN

## 2022-01-08 MED ORDER — SODIUM CHLORIDE 0.9 % IV SOLN
1.0000 g | Freq: Once | INTRAVENOUS | Status: AC
Start: 2022-01-08 — End: 2022-01-08
  Administered 2022-01-08: 1 g via INTRAVENOUS
  Filled 2022-01-08: qty 10

## 2022-01-08 MED ORDER — CEFPODOXIME PROXETIL 200 MG PO TABS: 200.0000 mg | ORAL_TABLET | Freq: Two times a day (BID) | ORAL | 0 refills | Status: AC

## 2022-01-08 MED ORDER — SODIUM CHLORIDE 0.9 % IV BOLUS
1000.0000 mL | Freq: Once | INTRAVENOUS | Status: AC
  Administered 2022-01-08: 1000 mL via INTRAVENOUS

## 2022-01-08 NOTE — Discharge Instructions (Signed)
You were seen in the ED today with vomiting and abdominal pain. I am treating you with antibiotics for the next 10 days for a urine infection. I have also called in medication to help with vomiting. If you develop worsening symptoms you should return to the ED for re-evaluation. Otherwise, please follow with your primary care doctor in the coming week to review your ED visit and symptoms.

## 2022-01-08 NOTE — ED Provider Notes (Signed)
Emergency Department Provider Note   I have reviewed the triage vital signs and the nursing notes.   HISTORY  Chief Complaint Abdominal Pain   HPI Tiffany Le is a 46 y.o. female with PMH reviewed below presents to the ED with lower abdominal discomfort, worse on the right. Symptoms began at 6 PM this evening. Pain is worse in the RLQ and associated with non-bloody emesis. She has been managing constipation at home with last BM today, although small. She recalls that she had similar symptoms 4 weeks prior when she started Ozempic, which she continues to take. No upper abdominal or CP. No dysuria, hesitancy, or urgency. No vaginal bleeding or discharge. No pain radiating to the back/flank.     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 ENT: No sore throat. Cardiovascular: Denies chest pain. Respiratory: Denies shortness of breath. Gastrointestinal: Positive RLQ abdominal pain. Positive nausea and vomiting.  No diarrhea. Positive constipation. Genitourinary: Negative for dysuria. Musculoskeletal: Negative for back pain. Skin: Negative for rash. Neurological: Negative for headaches, focal weakness or numbness.   ____________________________________________   PHYSICAL EXAM:  VITAL SIGNS: ED Triage Vitals  Enc Vitals Group     BP 01/07/22 2321 125/69     Pulse Rate 01/07/22 2321 (!) 116     Resp 01/07/22 2321 16     Temp 01/07/22 2321 99.7 F (37.6 C)      Temp Source 01/07/22 2321 Oral     SpO2 01/07/22 2322 98 %   Constitutional: Alert and oriented. Well appearing and in no acute distress. Eyes: Conjunctivae are normal. Head: Atraumatic. Nose: No congestion/rhinnorhea. Mouth/Throat: Mucous membranes are moist.  Neck: No stridor.   Cardiovascular: Tachycardia. Good peripheral circulation. Grossly normal heart sounds.   Respiratory: Normal respiratory effort.  No retractions. Lungs CTAB. Gastrointestinal: Soft with mild RLQ tenderness. No peritonitis. No distention.  Musculoskeletal: No lower extremity tenderness nor edema. No gross deformities of extremities. Neurologic:  Normal speech and language. No gross focal neurologic deficits are appreciated.  Skin:  Skin is warm, dry and intact. No rash noted.  ____________________________________________   LABS (all labs ordered are listed, but only abnormal results are displayed)  Labs Reviewed  COMPREHENSIVE METABOLIC PANEL - Abnormal; Notable for the following components:      Result Value   Chloride 97 (*)    Glucose, Bld 177 (*)    Creatinine, Ser 1.06 (*)    Total Protein 8.3 (*)    All other components within normal limits  CBC WITH DIFFERENTIAL/PLATELET - Abnormal; Notable for the following components:   WBC 17.8 (*)    Hemoglobin 11.9 (*)    Neutro Abs 15.2 (*)    Monocytes Absolute 1.3 (*)    All other components within normal limits  URINALYSIS, ROUTINE W REFLEX MICROSCOPIC - Abnormal; Notable for the following components:   APPearance CLOUDY (*)    Glucose, UA >=500 (*)    Hgb urine dipstick MODERATE (*)  Protein, ur 30 (*)    Leukocytes,Ua LARGE (*)    WBC, UA >50 (*)    Bacteria, UA MANY (*)    Trichomonas, UA PRESENT (*)    All other components within normal limits  URINE CULTURE  LIPASE, BLOOD  I-STAT BETA HCG BLOOD, ED ([facility], [facility], [facility] ONLY)   ____________________________________________  RADIOLOGY  CT Renal Stone Study  Result Date: 01/08/2022 CLINICAL  DATA:  Right lower quadrant/right flank pain, nausea/vomiting EXAM: CT ABDOMEN AND PELVIS WITHOUT CONTRAST TECHNIQUE: Multidetector CT imaging of the abdomen and pelvis was performed following the standard protocol without IV contrast. RADIATION DOSE REDUCTION: This exam was performed according to the departmental dose-optimization program which includes automated exposure control, adjustment of the mA and/or kV according to patient size and/or use of iterative reconstruction technique. COMPARISON:  12/02/2019 FINDINGS: Lower chest: Lung bases are clear. Hepatobiliary: Unenhanced liver is unremarkable. Gallbladder is unremarkable. No intrahepatic or extrahepatic ductal dilatation. Pancreas: Within normal limits. Spleen: Within normal limits. Adrenals/Urinary Tract: Adrenal glands are within normal limits. Kidneys are within normal limits. No renal calculi or hydronephrosis. Bladder is mildly thick-walled although underdistended. Stomach/Bowel: Stomach is within normal limits. No evidence of bowel obstruction. Normal appendix (series 3/image 62). No colonic wall thickening or inflammatory changes. Vascular/Lymphatic: No evidence of abdominal aortic aneurysm. No suspicious abdominopelvic lymphadenopathy. Reproductive: Uterus is notable for a partially calcified exophytic fibroid along the right uterine body (series 3/image 87), similar to priors. Bilateral ovaries are within normal limits. Other: No abdominopelvic ascites. Musculoskeletal: Visualized osseous structures are within normal limits. IMPRESSION: Negative CT abdomen/pelvis. Electronically Signed   By: Julian Hy M.D.   On: 01/08/2022 00:53    ____________________________________________   PROCEDURES  Procedure(s) performed:   Procedures  None  ____________________________________________   INITIAL IMPRESSION / ASSESSMENT AND PLAN / ED COURSE  Pertinent labs & imaging results that were available during my care of the patient were  reviewed by me and considered in my medical decision making (see chart for details).   This patient is Presenting for Evaluation of abdominal pain, which does require a range of treatment options, and is a complaint that involves a high risk of morbidity and mortality.  The Differential Diagnoses includes but is not exclusive to ectopic pregnancy, ovarian cyst, ovarian torsion, acute appendicitis, urinary tract infection, endometriosis, bowel obstruction, hernia, colitis, renal colic, gastroenteritis, volvulus etc.   Critical Interventions-    Medications  ondansetron (ZOFRAN-ODT) disintegrating tablet 4 mg (4 mg Oral Given 01/07/22 2329)  cefTRIAXone (ROCEPHIN) 1 g in sodium chloride 0.9 % 100 mL IVPB (1 g Intravenous New Bag/Given 01/08/22 0404)  sodium chloride 0.9 % bolus 1,000 mL (1,000 mLs Intravenous New Bag/Given 01/08/22 0405)    Reassessment after intervention: Symptoms improved.    I did obtain Additional Historical Information from daughter at bedside.  I decided to review pertinent External Data, and in summary phone notes from yesterday reviewed.   Clinical Laboratory Tests Ordered, included patient with what appears to be a urinary tract infection on UA.  We will send this for culture.  Patient also with leukocytosis to 17.8.  Lipase, bilirubin, LFTs are within normal limits. Pregnancy negative.   Radiologic Tests Ordered, included CT renal. I independently interpreted the images and agree with radiology interpretation.   Cardiac Monitor Tracing which shows sinus tachycardia.   Social Determinants of Health Risk patient is a non-smoker.   Medical Decision Making: Summary:  Patient presents emergency department lower abdominal pain, vomiting, subjective fever.  Mild tachycardia here along with leukocytosis by labs.  She overall appears well with fairly minimal tenderness.  CT renal ordered during MSE. Appendix is visualized and normal. Plan for rocephin covering for UTI and  IVF. Patient's nausea much improved after ODT Zofran. Doubt pyelo clinically without back/flank tenderness or CVA tenderness.   Reevaluation with update and discussion with   Considered admission but symptoms improving with abx and IVF. Vitals have normalized.   Disposition: discharge  ____________________________________________  FINAL CLINICAL IMPRESSION(S) / ED DIAGNOSES  Final diagnoses:  Right lower quadrant abdominal pain  Acute cystitis without hematuria  Nausea and vomiting, unspecified vomiting type     NEW OUTPATIENT MEDICATIONS STARTED DURING THIS VISIT:  New Prescriptions   CEFPODOXIME (VANTIN) 200 MG TABLET    Take 1 tablet (200 mg total) by mouth 2 (two) times daily for 10 days.   ONDANSETRON (ZOFRAN-ODT) 4 MG DISINTEGRATING TABLET    Take 1 tablet (4 mg total) by mouth every 8 (eight) hours as needed for nausea or vomiting.    Note:  This document was prepared using Dragon voice recognition software and may include unintentional dictation errors.  Nanda Quinton, MD, [facility] Emergency Medicine    Rozina Pointer, [name], MD 01/08/22 [phone]

## 2022-01-10 ENCOUNTER — Encounter (HOSPITAL_COMMUNITY): Payer: No Typology Code available for payment source

## 2022-01-13 ENCOUNTER — Encounter (HOSPITAL_COMMUNITY): Payer: No Typology Code available for payment source

## 2022-01-15 ENCOUNTER — Telehealth: Payer: Self-pay | Admitting: Pharmacist

## 2022-01-15 ENCOUNTER — Encounter (HOSPITAL_COMMUNITY): Payer: No Typology Code available for payment source

## 2022-01-15 NOTE — Telephone Encounter (Signed)
Called pt to follow up with Ozempic tolerability and dose titration. Also plan to decrease/stop glipizide when able (currently on '5mg'$  BID).  Left message for pt to discuss.

## 2022-01-17 ENCOUNTER — Ambulatory Visit: Payer: Medicaid Other | Admitting: Nurse Practitioner

## 2022-01-17 ENCOUNTER — Encounter (HOSPITAL_COMMUNITY): Payer: No Typology Code available for payment source

## 2022-01-20 ENCOUNTER — Ambulatory Visit: Payer: Medicaid Other | Admitting: Nurse Practitioner

## 2022-01-20 ENCOUNTER — Encounter (HOSPITAL_COMMUNITY): Payer: No Typology Code available for payment source

## 2022-01-22 ENCOUNTER — Encounter (HOSPITAL_COMMUNITY): Payer: No Typology Code available for payment source

## 2022-01-24 ENCOUNTER — Encounter (HOSPITAL_COMMUNITY): Payer: No Typology Code available for payment source

## 2022-01-24 NOTE — Addendum Note (Signed)
Encounter addended by: Sol Passer on: 01/24/2022 2:41 PM  Actions taken: Flowsheet data copied forward, Flowsheet accepted

## 2022-01-27 NOTE — Telephone Encounter (Signed)
2nd message left for pt.

## 2022-01-28 ENCOUNTER — Other Ambulatory Visit: Payer: Self-pay | Admitting: Internal Medicine

## 2022-01-28 NOTE — Telephone Encounter (Signed)
Glipizide refill declined. Should be sent to PCP

## 2022-01-28 NOTE — Addendum Note (Signed)
Encounter addended by: Sol Passer on: 01/28/2022 8:48 AM  Actions taken: Vitals modified

## 2022-02-03 MED ORDER — SEMAGLUTIDE (1 MG/DOSE) 4 MG/3ML ~~LOC~~ SOPN: 1.0000 mg | PEN_INJECTOR | SUBCUTANEOUS | 0 refills | Status: DC

## 2022-02-17 NOTE — Telephone Encounter (Signed)
No additional notes

## 2022-02-27 ENCOUNTER — Other Ambulatory Visit: Payer: Self-pay | Admitting: Internal Medicine

## 2022-02-28 NOTE — Telephone Encounter (Signed)
Pt's pharmacy is requesting a refill on glipizide. Would Dr. Ali Lowe like to refill this medication? Please address

## 2022-03-03 ENCOUNTER — Telehealth: Payer: Self-pay

## 2022-03-03 ENCOUNTER — Other Ambulatory Visit: Payer: Self-pay | Admitting: Nurse Practitioner

## 2022-03-03 NOTE — Telephone Encounter (Signed)
Please verify medication needed. I do not see this listed. Thanks.

## 2022-03-04 ENCOUNTER — Telehealth: Payer: Self-pay

## 2022-03-04 ENCOUNTER — Telehealth: Payer: Self-pay | Admitting: Pharmacist

## 2022-03-04 NOTE — Telephone Encounter (Signed)
No additional notes needed  

## 2022-03-04 NOTE — Telephone Encounter (Signed)
Called pt to follow up with Ozempic tolerability and left message.  Will plan to increase to '2mg'$  weekly if tolerating well. Will need to see how home glucose readings have been - ideally would decrease or d/c glipizide if possible.

## 2022-03-05 ENCOUNTER — Other Ambulatory Visit: Payer: Self-pay | Admitting: Nurse Practitioner

## 2022-03-05 MED ORDER — ISOSORB DINITRATE-HYDRALAZINE 20-37.5 MG PO TABS: 1.0000 | ORAL_TABLET | Freq: Three times a day (TID) | ORAL | 2 refills | Status: DC

## 2022-03-05 MED ORDER — OZEMPIC (2 MG/DOSE) 8 MG/3ML ~~LOC~~ SOPN
2.0000 mg | PEN_INJECTOR | SUBCUTANEOUS | 11 refills | Status: DC
  Filled 2022-05-23: qty 3, 28d supply, fill #0
  Filled 2022-07-09: qty 3, 28d supply, fill #1
  Filled 2022-08-12: qty 3, 28d supply, fill #2
  Filled 2022-09-08: qty 3, 28d supply, fill #3
  Filled 2022-10-16 – 2022-10-24 (×2): qty 3, 28d supply, fill #4
  Filled 2022-12-01: qty 3, 28d supply, fill #5
  Filled 2023-01-13: qty 3, 28d supply, fill #6
  Filled 2023-02-16: qty 3, 28d supply, fill #7

## 2022-03-05 NOTE — Telephone Encounter (Signed)
Patient returned call

## 2022-03-05 NOTE — Telephone Encounter (Signed)
Spoke with pt. Giving her 2nd or 3rd dose of ozempic '1mg'$  today, nauseous once otherwise has been tolerating it well. Reports lower appetite, not much change in her weight yet. Reports glucose readings have been great, ranging 74-92 in the AM fasting. Ran out of her glipizide a few days ago and glucose has stayed well controlled. She'd like to increase to Ozempic '2mg'$ . New rx sent in with refills. Advised her to keep a close eye on glucose to make sure readings stay controlled with higher dose of Ozempic and without her sulfonylurea. Reports she has follow up with her PCP soon as well. F/u with PharmD as needed.

## 2022-03-07 NOTE — Telephone Encounter (Signed)
Patient calling back. She says she is actually completely out of the ozempic.

## 2022-03-07 NOTE — Telephone Encounter (Signed)
I advised pt on the phone 2 days ago that I was sending in a prescription with refills. This is already at the pharmacy. Pt is aware.

## 2022-03-12 ENCOUNTER — Ambulatory Visit (INDEPENDENT_AMBULATORY_CARE_PROVIDER_SITE_OTHER): Payer: Medicaid Other | Admitting: Nurse Practitioner

## 2022-03-12 ENCOUNTER — Encounter: Payer: Self-pay | Admitting: Nurse Practitioner

## 2022-03-12 VITALS — BP 147/86 | Temp 97.5°F | Ht 64.0 in | Wt 256.0 lb

## 2022-03-12 DIAGNOSIS — E119 Type 2 diabetes mellitus without complications: Secondary | ICD-10-CM | POA: Insufficient documentation

## 2022-03-12 DIAGNOSIS — M79671 Pain in right foot: Secondary | ICD-10-CM

## 2022-03-12 DIAGNOSIS — E1140 Type 2 diabetes mellitus with diabetic neuropathy, unspecified: Secondary | ICD-10-CM

## 2022-03-12 DIAGNOSIS — E1169 Type 2 diabetes mellitus with other specified complication: Secondary | ICD-10-CM | POA: Diagnosis not present

## 2022-03-12 DIAGNOSIS — E1142 Type 2 diabetes mellitus with diabetic polyneuropathy: Secondary | ICD-10-CM | POA: Insufficient documentation

## 2022-03-12 LAB — POCT GLYCOSYLATED HEMOGLOBIN (HGB A1C)
HbA1c POC (<> result, manual entry): 7.2 % (ref 4.0–5.6)
HbA1c, POC (controlled diabetic range): 7.2 % — AB (ref 0.0–7.0)
HbA1c, POC (prediabetic range): 7.2 % — AB (ref 5.7–6.4)
Hemoglobin A1C: 7.2 % — AB (ref 4.0–5.6)

## 2022-03-12 MED ORDER — GLIPIZIDE 5 MG PO TABS: 5.0000 mg | ORAL_TABLET | Freq: Two times a day (BID) | ORAL | 3 refills | Status: DC

## 2022-03-12 MED ORDER — LOSARTAN POTASSIUM 50 MG PO TABS: 50.0000 mg | ORAL_TABLET | Freq: Every day | ORAL | 2 refills | Status: DC

## 2022-03-12 MED ORDER — IBUPROFEN 600 MG PO TABS: 600.0000 mg | ORAL_TABLET | Freq: Three times a day (TID) | ORAL | 0 refills | Status: DC | PRN

## 2022-03-12 NOTE — Patient Instructions (Addendum)
1. Type 2 diabetes mellitus with other specified complication, without long-term current use of insulin (HCC)  - POCT glycosylated hemoglobin (Hb A1C) - glipiZIDE (GLUCOTROL) 5 MG tablet; Take 1 tablet (5 mg total) by mouth 2 (two) times daily before a meal.  Dispense: 60 tablet; Refill: 3   2. Pain of right heel  - DG Foot Complete Right   Follow up:  Follow up in 3 months or sooner if needed

## 2022-03-12 NOTE — Progress Notes (Signed)
$'@Patient'b$  ID: Tiffany Le, female    DOB: [birth year], 46 y.o.   MRN: [id number]  Chief Complaint  Patient presents with   Follow-up    Pt is here for 3 month's DM follow up. Pt states she feels fatigue and unable to sleep at night. Rt heel sharp pain. Pt is requesting refill on torsemide,    Referring provider: Fenton Foy, NP   HPI  Tiffany Le 46 y.o. female  has a past medical history of Coronary artery disease, CVA (cerebral vascular accident) ([place]) (09/2020), Diabetes mellitus without complication ([place]), Diabetic neuropathy ([place]) (02/2020), CABG (09/2020), Hyperlipidemia (age 16), Hypertension (age 58), Hypocalcemia (11/2019), Hypokalemia (11/2019), Iron deficiency anemia, NSTEMI (non-ST elevated myocardial infarction) ([place]) (09/2020), Obesity, Proteinuria (12/09/2019), and Pulmonary embolism ([place]) (09/2020). To the [facility] for bilateral leg swelling and fatigue.  Patient presents for a follow up. Currently compliant with all medications. States that blood glucose at home 72-270. Endorses monitoring meals and exercising intermittently throughout the week. States that since last visit neuropathy has improved with increased dosage of gabapentin. 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 Denies any other concerns today.   Has been taking ozempic only stopped glipizide. Will reorder today.   Complaining of right heel pain - 2 weeks - not taking anything for this. Will check xray.  Denies f/c/s, n/v/d, hemoptysis, PND, leg swelling Denies chest pain or edema   Allergies  Allergen Reactions   Oxycodone Nausea And Vomiting and Other (See Comments)    Pt states she had dizziness   Penicillins Rash    As a baby     There is no immunization history on file for this patient.  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   aspirin EC 81 MG tablet Take 81 mg by mouth daily.   dapagliflozin propanediol (FARXIGA) 10 MG TABS tablet Take 1 tablet (10 mg total) by mouth daily. Need appointment for further refills   ezetimibe (ZETIA) 10 MG tablet Take 1 tablet (10 mg total) by mouth daily.   gabapentin (NEURONTIN) 300 MG capsule TAKE 1 CAPSULE(300 MG) BY MOUTH TWICE DAILY   glipiZIDE (GLUCOTROL) 5 MG tablet Take 1 tablet (5 mg total) by mouth 2 (two) times daily before a meal.   ibuprofen (ADVIL) 600 MG tablet Take 1 tablet (600 mg total) by mouth every 8 (eight) hours as needed.   Insulin Pen Needle 31G X 5 MM MISC Use as directed with insulin.   methocarbamol (ROBAXIN) 500 MG tablet Take 1 tablet (500 mg total) by mouth every 8 (eight) hours as needed for muscle spasms.   nitroGLYCERIN (NITROSTAT) 0.4 MG SL tablet DISSOLVE  1 TABLET UNDER THE TONGUE EVERY 5 MINUTES FOR 3 DOSES AS NEEDED FOR CHEST PAIN   ondansetron (ZOFRAN-ODT) 4 MG disintegrating tablet Take 1 tablet (4 mg total) by mouth every 8 (eight) hours as needed for nausea or vomiting.   rosuvastatin (CRESTOR) 40 MG tablet Take 1 tablet (40 mg total) by mouth daily.   Semaglutide, 2 MG/DOSE, (OZEMPIC, 2 MG/DOSE,) 8 MG/3ML SOPN Inject 2 mg into the skin once a week.   torsemide (DEMADEX) 20 MG tablet TAKE 1 TABLET(20 MG) BY MOUTH  TWICE DAILY   Vitamin D, Ergocalciferol, (DRISDOL) 1.25 MG (50000 UNIT) CAPS capsule TAKE 1 CAPSULE BY MOUTH EVERY 7 DAYS FOR 8 DOSES   [DISCONTINUED] losartan (COZAAR) 50 MG tablet Take 50 mg by mouth at bedtime.   insulin glargine (LANTUS SOLOSTAR) 100 UNIT/ML Solostar Pen Inject 8 Units into the skin 2 (two) times daily. (Patient not taking: Reported on 03/12/2022)   isosorbide-hydrALAZINE (BIDIL) 20-37.5 MG tablet Take 1 tablet by mouth 3 (three) times daily. (Patient not taking: Reported on 03/12/2022)   losartan (COZAAR) 50 MG tablet Take 1 tablet (50 mg total) by mouth at bedtime.   No facility-administered encounter medications on file as of 03/12/2022.     Review of Systems  Review of Systems  Constitutional: Negative.   HENT: Negative.    Cardiovascular: Negative.   Gastrointestinal: Negative.   Musculoskeletal:        Right heel pain  Allergic/Immunologic: Negative.   Neurological: Negative.   Psychiatric/Behavioral: Negative.         Physical Exam  BP (!) 147/86 (BP Location: Right Arm, Patient Position: Sitting, Cuff Size: Large)   Temp (!) 97.5 F (36.4 C)   Ht '5\' 4"'$  (1.626 m)   Wt 256 lb (116.1 kg)   BMI 43.94 kg/m   Wt Readings from Last 5 Encounters:  03/12/22 256 lb (116.1 kg)  12/20/21 260 lb 9.3 oz (118.2 kg)  11/28/21 258 lb 6.1 oz (117.2 kg)  11/15/21 263 lb 8 oz (119.5 kg)  10/22/21 262 lb (118.8 kg)     Physical Exam Vitals and nursing note reviewed.  Constitutional:      General: She is not in acute distress.    Appearance: She is well-developed.  Cardiovascular:     Rate and Rhythm: Normal rate and regular rhythm.  Pulmonary:     Effort: Pulmonary effort is normal.     Breath sounds: Normal breath sounds.  Neurological:     Mental Status: She is alert and oriented to person, place, and time.      Lab Results:  CBC    Component Value Date/Time   WBC 17.8 (H) 01/07/2022 2342   RBC 4.23 01/07/2022 2342   HGB 11.9 (L) 01/07/2022  2342   HGB 11.7 08/20/2021 1112   HCT 37.2 01/07/2022 2342   HCT 35.3 08/20/2021 1112   PLT 268 01/07/2022 2342   PLT 319 08/20/2021 1112   MCV 87.9 01/07/2022 2342   MCV 84 08/20/2021 1112   MCH 28.1 01/07/2022 2342   MCHC 32.0 01/07/2022 2342   RDW 12.8 01/07/2022 2342   RDW 13.7 08/20/2021 1112   LYMPHSABS 1.2 01/07/2022 2342   LYMPHSABS 1.8 08/20/2021 1112   MONOABS 1.3 (H) 01/07/2022 2342   EOSABS 0.0 01/07/2022 2342   EOSABS 0.1 08/20/2021 1112   BASOSABS 0.0 01/07/2022 2342   BASOSABS 0.0 08/20/2021 1112    BMET    Component Value Date/Time   NA 139 01/07/2022 2342  NA 142 10/18/2021 1155   K 3.7 01/07/2022 2342   CL 97 (L) 01/07/2022 2342   CO2 29 01/07/2022 2342   GLUCOSE 177 (H) 01/07/2022 2342   BUN 14 01/07/2022 2342   BUN 20 10/18/2021 1155   CREATININE 1.06 (H) 01/07/2022 2342   CREATININE 0.57 01/12/2014 1140   CALCIUM 9.3 01/07/2022 2342   GFRNONAA >60 01/07/2022 2342   GFRAA 130 12/09/2019 1509    BNP    Component Value Date/Time   BNP 947.1 (H) 10/02/2020 0516    ProBNP    Component Value Date/Time   PROBNP 1,091 (H) 05/20/2021 1422    Imaging: No results found.   Assessment & Plan:   Diabetes mellitus ([place]) - POCT glycosylated hemoglobin (Hb A1C) - glipiZIDE (GLUCOTROL) 5 MG tablet; Take 1 tablet (5 mg total) by mouth 2 (two) times daily before a meal.  Dispense: 60 tablet; Refill: 3   2. Pain of right heel  - DG Foot Complete Right   Follow up:  Follow up in 3 months or sooner if needed     Fenton Foy, NP 03/12/2022

## 2022-03-12 NOTE — Assessment & Plan Note (Signed)
-   POCT glycosylated hemoglobin (Hb A1C) - glipiZIDE (GLUCOTROL) 5 MG tablet; Take 1 tablet (5 mg total) by mouth 2 (two) times daily before a meal.  Dispense: 60 tablet; Refill: 3   2. Pain of right heel  - DG Foot Complete Right   Follow up:  Follow up in 3 months or sooner if needed

## 2022-04-20 NOTE — Progress Notes (Addendum)
Cardiology Office Note:    Date:  04/20/2022   ID:  Tiffany Le, DOB 07-21-1976, MRN [id number]  PCP:  Ivonne Andrew, NP   [facility] Providers Cardiologist:  Alverda Skeans, MD Referring MD: Orion Crook I, NP   Chief Complaint/Reason for Referral:  Dyspnea/edema  PATIENT DID NOT APPEAR FOR APPOINTMENT   ASSESSMENT:    Dyspnea, unspecified type  Type 2 diabetes mellitus with complication, without long-term current use of insulin (HCC)  Hypertension associated with diabetes (HCC)  Hyperlipidemia associated with type 2 diabetes mellitus (HCC)  Body mass index (BMI) of 40.0-44.9 in adult [facility])  Cerebrovascular accident (CVA), unspecified mechanism (HCC)  S/P CABG x 4    PLAN:    In order of problems listed above: 1.  Dyspnea:  2.  Type 2 diabetes: Continue Farxiga, rosuvastatin, losartan, and aspirin. 3.  Hypertension:  4.  Hyperlipidemia: Goal LDL is less than 55.   5.  Elevated BMI: Managed by pharmacy.  On Ozempic. 6.  History of stroke: Continue ASA, statin, and BP control; bubble study echo negative. 7.  Coronary artery disease status post CABG: Continue ASA, statin, and BP control with aggressive risk factor control.               Dispo:  No follow-ups on file.     Medication Adjustments/Labs and Tests Ordered: Current medicines are reviewed at length with the patient today.  Concerns regarding medicines are outlined above.   Tests Ordered: No orders of the defined types were placed in this encounter.   Medication Changes: No orders of the defined types were placed in this encounter.   History of Present Illness:    FOCUSED CARDIOVASCULAR PROBLEM LIST:   Coronary artery disease status post CABG consisting of a LIMA to LAD, RIMA to PDA, and sequential left radial to OM1 to OM 2 with atrial appendage clipping March 2022 C/b sternal wound dehiscence requiring debridement  Hypertension Hyperlipidemia LDL goal less than 55 given  history of stroke Type 2 diabetes on insulin Neuropathy History of stroke MRI with remote lacunar infarct of right thalamus and subacute ischemic infarctions bilateral cerebral white matter and pons Bubble study echocardiogram April 2023 negative BMI 44 Diastolic dysfunction Left ventricular hypertrophy TTE June 2024  February 2023: Sherryll Burger was stopped due to normal LV function and losartan was started due to history of diabetes.  Crestor was started and patient was referred to cardiac rehabilitation as well as for sleep study.  March 2023: The patient returns for expedited follow-up due to increasing shortness of breath.  She noticed increasing peripheral edema and says that she gained 30 pounds.  She was also constipated.  Once her constipation was resolved her peripheral edema seem to improve.  Today she feels very well.  She denies any shortness of breath, chest pain, palpitations, paroxysmal nocturnal dyspnea, orthopnea.  In fact the only way she can sleep is absolutely flat.  She has required no emergency room visits or hospitalizations.  Given a history of unexplained stroke she was referred for a bubble study echocardiogram, Plavix was stopped and aspirin monotherapy continued due to history of CABG required.  Due to her ejection fraction being normal and her losartan was in creased to 50 mg.  Today:          Current Medications: No outpatient medications have been marked as taking for the 04/21/22 encounter (Appointment) with Orbie Pyo, MD.     Allergies:    Oxycodone and Penicillins  Social History:   Social History   Tobacco Use   Smoking status: Never   Smokeless tobacco: Never  Vaping Use   Vaping Use: Never used  Substance Use Topics   Alcohol use: No   Drug use: No     Family Hx: Family History  Problem Relation Age of Onset   Heart disease Mother        CABG <50   Hypertension Mother    Diabetes Mother    Hyperlipidemia Mother    Heart disease  Father    Hyperlipidemia Father    Hypertension Father    Early death Father    Healthy Daughter    Diabetes Maternal Aunt    Cancer Maternal Grandmother        stomach cancer   Diabetes Maternal Aunt    Cancer Cousin        colon cancer (dx'd 40)     Review of Systems:   Please see the history of present illness.    All other systems reviewed and are negative.     EKGs/Labs/Other Test Reviewed:    EKG: Sinus rhythm with left anterior fascicular block and anterior infarction pattern EKG from December 2022 demonstrates sinus rhythm with anteroseptal infarction pattern   Prior CV studies: TTE 4/23 1. Left ventricular ejection fraction, by estimation, is 55 to 60%. The  left ventricle has normal function. The left ventricle has no regional  wall motion abnormalities. There is mild concentric left ventricular  hypertrophy. Left ventricular diastolic  parameters are consistent with Grade I diastolic dysfunction (impaired  relaxation).   2. Right ventricular systolic function is mildly reduced. The right  ventricular size is normal. Tricuspid regurgitation signal is inadequate  for assessing PA pressure.   3. The mitral valve is normal in structure. No evidence of mitral valve  regurgitation. No evidence of mitral stenosis.   4. The aortic valve is tricuspid. Aortic valve regurgitation is not  visualized. No aortic stenosis is present.   5. The inferior vena cava is normal in size with greater than 50%  respiratory variability, suggesting right atrial pressure of 3 mmHg.   6. Negative bubble study at rest and with valsalva.  Imaging studies that I have independently reviewed today: Echocardiogram, CT renal stone study May 2023 without aortic atherosclerosis  Recent Labs: 05/20/2021: NT-Pro BNP 1,091; TSH 3.000 01/07/2022: ALT 18; BUN 14; Creatinine, Ser 1.06; Hemoglobin 11.9; Platelets 268; Potassium 3.7; Sodium 139   Recent Lipid Panel Lab Results  Component Value  Date/Time   CHOL 140 10/29/2021 08:52 AM   TRIG 58 10/29/2021 08:52 AM   HDL 42 10/29/2021 08:52 AM   LDLCALC 86 10/29/2021 08:52 AM   Signed, Orbie Pyo, MD  04/20/2022 6:40 AM    [facility] [address] [place], [place], [place]  [address] Phone: [phone]; Fax: [phone]   Note:  This document was prepared using Dragon voice recognition software and may include unintentional dictation errors.

## 2022-04-21 ENCOUNTER — Other Ambulatory Visit: Payer: Medicaid Other

## 2022-04-21 ENCOUNTER — Ambulatory Visit (INDEPENDENT_AMBULATORY_CARE_PROVIDER_SITE_OTHER): Payer: Medicaid Other | Admitting: Internal Medicine

## 2022-04-21 DIAGNOSIS — Z951 Presence of aortocoronary bypass graft: Secondary | ICD-10-CM

## 2022-04-21 DIAGNOSIS — R0600 Dyspnea, unspecified: Secondary | ICD-10-CM

## 2022-04-21 DIAGNOSIS — E118 Type 2 diabetes mellitus with unspecified complications: Secondary | ICD-10-CM

## 2022-04-21 DIAGNOSIS — E1159 Type 2 diabetes mellitus with other circulatory complications: Secondary | ICD-10-CM

## 2022-04-21 DIAGNOSIS — E1169 Type 2 diabetes mellitus with other specified complication: Secondary | ICD-10-CM

## 2022-04-21 DIAGNOSIS — Z6841 Body Mass Index (BMI) 40.0 and over, adult: Secondary | ICD-10-CM

## 2022-04-21 DIAGNOSIS — E785 Hyperlipidemia, unspecified: Secondary | ICD-10-CM

## 2022-04-21 DIAGNOSIS — I152 Hypertension secondary to endocrine disorders: Secondary | ICD-10-CM

## 2022-04-21 DIAGNOSIS — I639 Cerebral infarction, unspecified: Secondary | ICD-10-CM

## 2022-04-24 ENCOUNTER — Encounter: Payer: Self-pay | Admitting: Internal Medicine

## 2022-04-24 NOTE — Telephone Encounter (Signed)
Error

## 2022-04-24 NOTE — Progress Notes (Signed)
Error

## 2022-04-28 ENCOUNTER — Other Ambulatory Visit: Payer: Medicaid Other

## 2022-05-15 ENCOUNTER — Other Ambulatory Visit: Payer: Self-pay | Admitting: Nurse Practitioner

## 2022-05-15 ENCOUNTER — Other Ambulatory Visit: Payer: Self-pay

## 2022-05-15 ENCOUNTER — Telehealth: Payer: Self-pay

## 2022-05-15 DIAGNOSIS — E119 Type 2 diabetes mellitus without complications: Secondary | ICD-10-CM

## 2022-05-15 DIAGNOSIS — E1169 Type 2 diabetes mellitus with other specified complication: Secondary | ICD-10-CM

## 2022-05-15 DIAGNOSIS — R739 Hyperglycemia, unspecified: Secondary | ICD-10-CM

## 2022-05-15 MED ORDER — BD PEN NEEDLE MINI U/F 31G X 5 MM MISC
3 refills | Status: DC
  Filled 2022-05-15: qty 100, 50d supply, fill #0
  Filled 2022-05-23: qty 100, 30d supply, fill #0
  Filled 2022-08-13 – 2022-08-14 (×2): qty 100, 30d supply, fill #1

## 2022-05-15 NOTE — Telephone Encounter (Signed)
[place] Tramadol

## 2022-05-16 ENCOUNTER — Ambulatory Visit (INDEPENDENT_AMBULATORY_CARE_PROVIDER_SITE_OTHER): Payer: Medicaid Other | Admitting: Nurse Practitioner

## 2022-05-16 VITALS — BP 147/81 | HR 86 | Temp 98.6°F | Ht 64.0 in | Wt 250.0 lb

## 2022-05-16 DIAGNOSIS — R202 Paresthesia of skin: Secondary | ICD-10-CM | POA: Diagnosis not present

## 2022-05-16 DIAGNOSIS — R7309 Other abnormal glucose: Secondary | ICD-10-CM

## 2022-05-16 DIAGNOSIS — E1169 Type 2 diabetes mellitus with other specified complication: Secondary | ICD-10-CM | POA: Diagnosis not present

## 2022-05-16 DIAGNOSIS — E1140 Type 2 diabetes mellitus with diabetic neuropathy, unspecified: Secondary | ICD-10-CM | POA: Diagnosis not present

## 2022-05-16 DIAGNOSIS — G8929 Other chronic pain: Secondary | ICD-10-CM

## 2022-05-16 DIAGNOSIS — R200 Anesthesia of skin: Secondary | ICD-10-CM | POA: Diagnosis not present

## 2022-05-16 DIAGNOSIS — M25512 Pain in left shoulder: Secondary | ICD-10-CM

## 2022-05-16 MED ORDER — LANTUS SOLOSTAR 100 UNIT/ML ~~LOC~~ SOPN: 8.0000 [IU] | PEN_INJECTOR | Freq: Two times a day (BID) | SUBCUTANEOUS | 0 refills | Status: DC

## 2022-05-16 MED ORDER — TRAMADOL HCL 50 MG PO TABS
50.0000 mg | ORAL_TABLET | Freq: Three times a day (TID) | ORAL | 0 refills | Status: AC | PRN
Start: 2022-05-16 — End: 2022-05-21

## 2022-05-16 MED ORDER — LANTUS SOLOSTAR 100 UNIT/ML ~~LOC~~ SOPN: 8.0000 [IU] | PEN_INJECTOR | Freq: Two times a day (BID) | SUBCUTANEOUS | Status: DC

## 2022-05-16 NOTE — Patient Instructions (Addendum)
1. Type 2 diabetes mellitus with other specified complication, without long-term current use of insulin ([place])  - Ambulatory referral to Podiatry  2. Diabetic neuropathy, painful ([place])  - Ambulatory referral to Podiatry  3. Bilateral numbness and tingling of arms and legs  - Ambulatory referral to Neurology  Follow up:  Follow up in 3 months as scheduled

## 2022-05-16 NOTE — Progress Notes (Signed)
$'@Patient'Z$  ID: Tiffany Le, female    DOB: 31-Jul-1976, 46 y.o.   MRN: [id number]  Chief Complaint  Patient presents with   Follow-up    Pt is here to follow up on recent health concerns Pt complaining of her both arms and hands going numb when she lays down at night Pt also complaining of bi-lateral foot pain and numbness     Referring provider: Fenton Foy, NP   HPI  Tiffany Le 46 y.o. female  has a past medical history of Coronary artery disease, CVA (cerebral vascular accident) ([place]) (09/2020), Diabetes mellitus without complication ([place]), Diabetic neuropathy ([place]) (02/2020), CABG (09/2020), Hyperlipidemia (age 51), Hypertension (age 74), Hypocalcemia (11/2019), Hypokalemia (11/2019), Iron deficiency anemia, NSTEMI (non-ST elevated myocardial infarction) ([place]) (09/2020), Obesity, Proteinuria (12/09/2019), and Pulmonary embolism ([place]) (09/2020). To the [facility] for bilateral leg swelling and fatigue.   Patient presents for a follow up. Currently compliant with all medications. States that blood glucose at home 72-270. Endorses monitoring meals and exercising intermittently throughout the week.  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    Denies f/c/s, n/v/d, hemoptysis, PND, leg swelling Denies chest pain or edema   Patient complains today of bilateral foot pain and numbness. She States that she lost use of arms after heart surgery - had PT to regain movement, but is now having left shoulder instability and numbness and tingling to both arms as well.   Allergies  Allergen Reactions   Oxycodone Nausea And Vomiting and Other (See Comments)    Pt states she had dizziness   Penicillins Rash    As a baby     There is no immunization history on file for this patient.  Past Medical History:  Diagnosis Date   Coronary artery disease    CVA (cerebral vascular accident) ([place]) 09/2020   Diabetes mellitus without complication ([place])    Diabetic  neuropathy ([place]) 02/2020   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   Hyperlipidemia age 57   Hypertension age 71   Hypocalcemia 11/2019   Hypokalemia 11/2019   Iron deficiency anemia    NSTEMI (non-ST elevated myocardial infarction) ([place]) 09/2020   Obesity    Proteinuria 12/09/2019   Pulmonary embolism ([place]) 09/2020    Tobacco History: Social History   Tobacco Use  Smoking Status Never  Smokeless Tobacco Never   Counseling given: Not Answered   Outpatient Encounter Medications as of 05/16/2022  Medication Sig   dapagliflozin propanediol (FARXIGA) 10 MG TABS tablet Take 1 tablet (10 mg total) by mouth daily. Need appointment for further refills   gabapentin (NEURONTIN) 300 MG capsule TAKE 1 CAPSULE(300 MG) BY MOUTH TWICE DAILY   ibuprofen (ADVIL) 600 MG tablet Take 1 tablet (600 mg total) by mouth every 8 (eight) hours as needed.   Insulin Pen Needle (B-D UF III MINI PEN NEEDLES) 31G X 5 MM MISC Use as directed with insulin.   nitroGLYCERIN (NITROSTAT) 0.4 MG SL tablet DISSOLVE 1 TABLET UNDER THE TONGUE EVERY 5 MINUTES FOR 3 DOSES AS NEEDED FOR CHEST PAIN   ondansetron (ZOFRAN-ODT) 4 MG disintegrating tablet Take 1 tablet (4 mg total) by mouth every 8 (eight) hours as needed for nausea or vomiting.   Semaglutide, 2 MG/DOSE, (OZEMPIC, 2 MG/DOSE,) 8 MG/3ML SOPN Inject 2 mg into the skin once a week.   [EXPIRED] traMADol (ULTRAM) 50 MG tablet Take 1 tablet (50 mg total) by mouth every 8 (eight) hours as needed for up to 5  days.   [DISCONTINUED] albuterol (PROVENTIL HFA) 108 (90 Base) MCG/ACT inhaler Inhale 2 puffs into the lungs every 6 (six) hours as needed for wheezing.   [DISCONTINUED] aspirin EC 81 MG tablet Take 81 mg by mouth daily.   [DISCONTINUED] ezetimibe (ZETIA) 10 MG tablet Take 1 tablet (10 mg total) by mouth daily.   [DISCONTINUED] glipiZIDE (GLUCOTROL) 5 MG tablet Take 1 tablet (5 mg total) by mouth 2 (two) times daily before a meal.   [DISCONTINUED] insulin glargine (LANTUS SOLOSTAR) 100  UNIT/ML Solostar Pen Inject 8 Units into the skin 2 (two) times daily.   [DISCONTINUED] losartan (COZAAR) 50 MG tablet Take 1 tablet (50 mg total) by mouth at bedtime.   [DISCONTINUED] methocarbamol (ROBAXIN) 500 MG tablet Take 1 tablet (500 mg total) by mouth every 8 (eight) hours as needed for muscle spasms.   [DISCONTINUED] rosuvastatin (CRESTOR) 40 MG tablet Take 1 tablet (40 mg total) by mouth daily.   [DISCONTINUED] torsemide (DEMADEX) 20 MG tablet TAKE 1 TABLET(20 MG) BY MOUTH TWICE DAILY   [EXPIRED] isosorbide-hydrALAZINE (BIDIL) 20-37.5 MG tablet Take 1 tablet by mouth 3 (three) times daily. (Patient not taking: Reported on 03/12/2022)   [DISCONTINUED] insulin glargine (LANTUS SOLOSTAR) 100 UNIT/ML Solostar Pen Inject 8 Units into the skin 2 (two) times daily.   [DISCONTINUED] insulin glargine (LANTUS SOLOSTAR) 100 UNIT/ML Solostar Pen Inject 8 Units into the skin 2 (two) times daily.   [DISCONTINUED] Vitamin D, Ergocalciferol, (DRISDOL) 1.25 MG (50000 UNIT) CAPS capsule TAKE 1 CAPSULE BY MOUTH EVERY 7 DAYS FOR 8 DOSES   No facility-administered encounter medications on file as of 05/16/2022.     Review of Systems  Review of Systems  Constitutional: Negative.   HENT: Negative.    Cardiovascular: Negative.   Gastrointestinal: Negative.   Skin:        Bilateral foot pain, shoulder pain  Allergic/Immunologic: Negative.   Neurological: Negative.   Psychiatric/Behavioral: Negative.         Physical Exam  BP (!) 147/81 (BP Location: Right Arm, Patient Position: Standing, Cuff Size: Large)   Pulse 86   Temp 98.6 F (37 C)   Ht '5\' 4"'$  (1.626 m)   Wt 250 lb (113.4 kg)   LMP 05/14/2022   SpO2 100%   BMI 42.91 kg/m   Wt Readings from Last 5 Encounters:  08/14/22 251 lb (113.9 kg)  05/16/22 250 lb (113.4 kg)  03/12/22 256 lb (116.1 kg)  12/20/21 260 lb 9.3 oz (118.2 kg)  11/28/21 258 lb 6.1 oz (117.2 kg)     Physical Exam Vitals and nursing note reviewed.   Constitutional:      General: She is not in acute distress.    Appearance: She is well-developed.  Cardiovascular:     Rate and Rhythm: Normal rate and regular rhythm.  Pulmonary:     Effort: Pulmonary effort is normal.     Breath sounds: Normal breath sounds.  Neurological:     Mental Status: She is alert and oriented to person, place, and time.      Lab Results:  CBC    Component Value Date/Time   WBC 17.8 (H) 01/07/2022 2342   RBC 4.23 01/07/2022 2342   HGB 11.9 (L) 01/07/2022 2342   HGB 11.7 08/20/2021 1112   HCT 37.2 01/07/2022 2342   HCT 35.3 08/20/2021 1112   PLT 268 01/07/2022 2342   PLT 319 08/20/2021 1112   MCV 87.9 01/07/2022 2342   MCV 84 08/20/2021 1112   MCH  28.1 01/07/2022 2342   MCHC 32.0 01/07/2022 2342   RDW 12.8 01/07/2022 2342   RDW 13.7 08/20/2021 1112   LYMPHSABS 1.2 01/07/2022 2342   LYMPHSABS 1.8 08/20/2021 1112   MONOABS 1.3 (H) 01/07/2022 2342   EOSABS 0.0 01/07/2022 2342   EOSABS 0.1 08/20/2021 1112   BASOSABS 0.0 01/07/2022 2342   BASOSABS 0.0 08/20/2021 1112    BMET    Component Value Date/Time   NA 139 01/07/2022 2342   NA 142 10/18/2021 1155   K 3.7 01/07/2022 2342   CL 97 (L) 01/07/2022 2342   CO2 29 01/07/2022 2342   GLUCOSE 177 (H) 01/07/2022 2342   BUN 14 01/07/2022 2342   BUN 20 10/18/2021 1155   CREATININE 1.06 (H) 01/07/2022 2342   CREATININE 0.57 01/12/2014 1140   CALCIUM 9.3 01/07/2022 2342   GFRNONAA >60 01/07/2022 2342   GFRAA 130 12/09/2019 1509    BNP    Component Value Date/Time   BNP 947.1 (H) 10/02/2020 0516    ProBNP    Component Value Date/Time   PROBNP 1,091 (H) 05/20/2021 1422    Imaging: No results found.   Assessment & Plan:   Diabetes mellitus ([place]) - Ambulatory referral to Podiatry  2. Diabetic neuropathy, painful ([place])  - Ambulatory referral to Podiatry  3. Bilateral numbness and tingling of arms and legs  - Ambulatory referral to Neurology  Follow up:  Follow up in 3  months as scheduled     Fenton Foy, NP 08/14/2022

## 2022-05-19 ENCOUNTER — Encounter: Payer: Self-pay | Admitting: *Deleted

## 2022-05-19 ENCOUNTER — Telehealth: Payer: Self-pay | Admitting: *Deleted

## 2022-05-19 NOTE — Telephone Encounter (Signed)
Letter has been sent to patient informing them that their split night sleep study has expired. Patient will need to call and schedule an office visit to re-evaluate the need for a sleep study.

## 2022-05-21 ENCOUNTER — Other Ambulatory Visit: Payer: Self-pay

## 2022-05-22 ENCOUNTER — Ambulatory Visit: Payer: Medicaid Other | Admitting: Podiatry

## 2022-05-23 ENCOUNTER — Other Ambulatory Visit: Payer: Self-pay

## 2022-05-31 ENCOUNTER — Other Ambulatory Visit: Payer: Self-pay | Admitting: Nurse Practitioner

## 2022-05-31 DIAGNOSIS — I10 Essential (primary) hypertension: Secondary | ICD-10-CM

## 2022-05-31 DIAGNOSIS — E1169 Type 2 diabetes mellitus with other specified complication: Secondary | ICD-10-CM

## 2022-06-09 ENCOUNTER — Ambulatory Visit (INDEPENDENT_AMBULATORY_CARE_PROVIDER_SITE_OTHER): Payer: Medicaid Other | Admitting: Podiatry

## 2022-06-09 DIAGNOSIS — E119 Type 2 diabetes mellitus without complications: Secondary | ICD-10-CM | POA: Diagnosis not present

## 2022-06-09 NOTE — Progress Notes (Signed)
Chief Complaint  Patient presents with   Diabetes    Pt stated that her doctor she needed a foot exam she denies any pain at this time but stated that she does have some numbness     HPI: 46 y.o. female presenting today for routine diabetic foot exam referred by her PCP.  Patient has no foot pain or concerns about her feet.  Last A1c 7.2 on 03/12/2022.  Presenting for routine diabetic foot exam  Past Medical History:  Diagnosis Date   Coronary artery disease    CVA (cerebral vascular accident) ([place]) 09/2020   Diabetes mellitus without complication (HCC)    Diabetic neuropathy ([place]) 02/2020   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   Hyperlipidemia age 77   Hypertension age 59   Hypocalcemia 11/2019   Hypokalemia 11/2019   Iron deficiency anemia    NSTEMI (non-ST elevated myocardial infarction) ([place]) 09/2020   Obesity    Proteinuria 12/09/2019   Pulmonary embolism ([place]) 09/2020    Past Surgical History:  Procedure Laterality Date   APPLICATION OF WOUND VAC N/A 12/14/2020   Procedure: APPLICATION OF WOUND VAC;  Surgeon: Rexene Alberts, MD;  Location: [facility];  Service: Thoracic;  Laterality: N/A;   APPLICATION OF WOUND VAC N/A 12/25/2020   Procedure: WOUND VAC CHANGE;  Surgeon: [name], MD;  Location: [facility];  Service: Thoracic;  Laterality: N/A;   CARDIAC CATHETERIZATION  09/25/2020   CESAREAN SECTION  3/96   CLIPPING OF ATRIAL APPENDAGE N/A 10/09/2020   Procedure: CLIPPING OF ATRIAL APPENDAGE USING ATRICURE 35MM CLIP;  Surgeon: [name], MD;  Location: [facility];  Service: Open Heart Surgery;  Laterality: N/A;   CORONARY ARTERY BYPASS GRAFT N/A 10/09/2020   Procedure: CORONARY ARTERY BYPASS GRAFTING (CABG), ON PUMP, TIMES FOUR, USING LEFT INTERNAL MAMMARY ARTERY AND LEFT RADIAL ARTERY (OPEN HARVEST);  Surgeon: [name], MD;  Location: [facility];  Service: Open Heart Surgery;  Laterality: N/A;  POSSIBLE BIMA   INCISION AND DRAINAGE OF WOUND N/A 12/20/2020   Procedure: STERNAL WOUND  DEBRIDEMENT WITH SKIN SUBSTITUTION AND ABRA;  Surgeon: Wallace Going, DO;  Location: [facility];  Service: Plastics;  Laterality: N/A;   IR THORACENTESIS ASP PLEURAL SPACE W/IMG GUIDE  10/16/2020   LEFT HEART CATH AND CORONARY ANGIOGRAPHY N/A 09/25/2020   Procedure: LEFT HEART CATH AND CORONARY ANGIOGRAPHY;  Surgeon: Leonie Man, MD;  Location: [facility];  Service: Cardiovascular;  Laterality: N/A;   ORIF FEMUR FRACTURE     RADIAL ARTERY HARVEST Left 10/09/2020   Procedure: RADIAL ARTERY HARVEST;  Surgeon: [name], MD;  Location: [facility];  Service: Open Heart Surgery;  Laterality: Left;   STERNAL CLOSURE N/A 12/25/2020   Procedure: STERNAL CLOSURE;  Surgeon: [name], MD;  Location: [facility];  Service: Thoracic;  Laterality: N/A;   STERNAL WOUND DEBRIDEMENT N/A 12/14/2020   Procedure: STERNAL WOUND DEBRIDEMENT;  Surgeon: Rexene Alberts, MD;  Location: [facility];  Service: Thoracic;  Laterality: N/A;   STERNAL WOUND DEBRIDEMENT N/A 12/18/2020   Procedure: STERNAL WOUND DEBRIDEMENT, Wound Vac Change;  Surgeon: [name], MD;  Location: [facility];  Service: Thoracic;  Laterality: N/A;   STERNAL WOUND DEBRIDEMENT N/A 12/25/2020   Procedure: STERNAL WOUND IRRIGATION AND DEBRIDEMENT;  Surgeon: [name], MD;  [facility];  Service: Thoracic;  Laterality: N/A;   TEE WITHOUT CARDIOVERSION N/A 10/09/2020   Procedure: TRANSESOPHAGEAL ECHOCARDIOGRAM (TEE);  Surgeon: Fredrich Romans  Z, MD;  Location: [facility];  Service: Open Heart Surgery;  Laterality: N/A;    Allergies  Allergen Reactions   Oxycodone Nausea And Vomiting and Other (See Comments)    Pt states she had dizziness   Penicillins Rash    As a baby     Physical Exam: General: The patient is alert and oriented x3 in no acute distress.  Dermatology: Skin is warm, dry and supple bilateral lower extremities. Negative for open lesions or macerations.  Vascular: Palpable pedal pulses bilaterally. Capillary refill  within normal limits.  Negative for any significant edema or erythema  Neurological: Light touch and protective threshold grossly intact  Musculoskeletal Exam: No pedal deformities noted  Assessment: 1.  Encounter for diabetic foot exam   Plan of Care:  1. Patient evaluated.  2.  Comprehensive diabetic foot exam performed today 3.  Continue to work closely with PCP for diabetes management 4.  Recommend good supportive shoes and sneakers.  Advised against going barefoot 5.  Return to clinic annually     Edrick Kins, DPM [facility]  Dr. Edrick Kins, DPM    [address], [place] [address]                Office [phone]  Fax [phone]

## 2022-06-12 ENCOUNTER — Ambulatory Visit: Payer: Medicaid Other | Admitting: Nurse Practitioner

## 2022-07-01 ENCOUNTER — Ambulatory Visit: Payer: Medicaid Other | Admitting: Neurology

## 2022-07-09 ENCOUNTER — Other Ambulatory Visit: Payer: Self-pay

## 2022-07-09 ENCOUNTER — Other Ambulatory Visit: Payer: Self-pay | Admitting: Nurse Practitioner

## 2022-07-09 DIAGNOSIS — E1169 Type 2 diabetes mellitus with other specified complication: Secondary | ICD-10-CM

## 2022-07-11 ENCOUNTER — Other Ambulatory Visit: Payer: Self-pay

## 2022-07-16 ENCOUNTER — Other Ambulatory Visit: Payer: Self-pay

## 2022-07-16 DIAGNOSIS — E1169 Type 2 diabetes mellitus with other specified complication: Secondary | ICD-10-CM

## 2022-07-16 MED ORDER — GLIPIZIDE 5 MG PO TABS: 5.0000 mg | ORAL_TABLET | Freq: Two times a day (BID) | ORAL | 1 refills | Status: DC

## 2022-07-29 ENCOUNTER — Other Ambulatory Visit: Payer: Self-pay | Admitting: Nurse Practitioner

## 2022-07-29 DIAGNOSIS — R7309 Other abnormal glucose: Secondary | ICD-10-CM

## 2022-07-29 DIAGNOSIS — E1169 Type 2 diabetes mellitus with other specified complication: Secondary | ICD-10-CM

## 2022-08-01 ENCOUNTER — Other Ambulatory Visit: Payer: Self-pay | Admitting: *Deleted

## 2022-08-01 DIAGNOSIS — R7309 Other abnormal glucose: Secondary | ICD-10-CM

## 2022-08-01 DIAGNOSIS — E1169 Type 2 diabetes mellitus with other specified complication: Secondary | ICD-10-CM

## 2022-08-01 MED ORDER — LANTUS SOLOSTAR 100 UNIT/ML ~~LOC~~ SOPN: 8.0000 [IU] | PEN_INJECTOR | Freq: Two times a day (BID) | SUBCUTANEOUS | 0 refills | Status: DC

## 2022-08-12 ENCOUNTER — Other Ambulatory Visit: Payer: Self-pay

## 2022-08-13 ENCOUNTER — Other Ambulatory Visit: Payer: Self-pay

## 2022-08-13 ENCOUNTER — Other Ambulatory Visit (HOSPITAL_COMMUNITY): Payer: Self-pay

## 2022-08-13 MED ORDER — ASPIRIN EC 81 MG PO TBEC
81.0000 mg | DELAYED_RELEASE_TABLET | Freq: Every day | ORAL | 1 refills | Status: DC
  Filled 2022-08-13: qty 90, 90d supply, fill #0

## 2022-08-13 NOTE — Telephone Encounter (Signed)
From: [name] To: Office of Fenton Foy, NP Sent: 08/13/2022 8:22 AM EST Subject: Medication Renewal Request  Refills have been requested for the following medications:   aspirin EC 81 MG tablet  Preferred pharmacy: [facility] Delivery method: Pickup Preferred pick-up date and time: 08/14/2022 5:00 PM

## 2022-08-14 ENCOUNTER — Encounter: Payer: Self-pay | Admitting: Nurse Practitioner

## 2022-08-14 ENCOUNTER — Other Ambulatory Visit: Payer: Self-pay

## 2022-08-14 ENCOUNTER — Other Ambulatory Visit: Payer: Self-pay | Admitting: Nurse Practitioner

## 2022-08-14 ENCOUNTER — Other Ambulatory Visit (HOSPITAL_COMMUNITY): Payer: Self-pay

## 2022-08-14 ENCOUNTER — Ambulatory Visit (INDEPENDENT_AMBULATORY_CARE_PROVIDER_SITE_OTHER): Payer: Medicaid Other | Admitting: Nurse Practitioner

## 2022-08-14 VITALS — BP 124/75 | HR 74 | Temp 98.1°F | Ht 64.0 in | Wt 251.0 lb

## 2022-08-14 DIAGNOSIS — Z1211 Encounter for screening for malignant neoplasm of colon: Secondary | ICD-10-CM

## 2022-08-14 DIAGNOSIS — Z13220 Encounter for screening for lipoid disorders: Secondary | ICD-10-CM | POA: Diagnosis not present

## 2022-08-14 DIAGNOSIS — R0602 Shortness of breath: Secondary | ICD-10-CM | POA: Diagnosis not present

## 2022-08-14 DIAGNOSIS — G4700 Insomnia, unspecified: Secondary | ICD-10-CM

## 2022-08-14 DIAGNOSIS — E1169 Type 2 diabetes mellitus with other specified complication: Secondary | ICD-10-CM | POA: Diagnosis not present

## 2022-08-14 LAB — POCT GLYCOSYLATED HEMOGLOBIN (HGB A1C): Hemoglobin A1C: 6.7 % — AB (ref 4.0–5.6)

## 2022-08-14 MED ORDER — ROSUVASTATIN CALCIUM 40 MG PO TABS
40.0000 mg | ORAL_TABLET | Freq: Every day | ORAL | 3 refills | Status: DC
  Filled 2022-08-14: qty 90, 90d supply, fill #0

## 2022-08-14 MED ORDER — ALBUTEROL SULFATE HFA 108 (90 BASE) MCG/ACT IN AERS: 2.0000 | INHALATION_SPRAY | Freq: Four times a day (QID) | RESPIRATORY_TRACT | 2 refills | Status: DC | PRN

## 2022-08-14 MED ORDER — ASPIRIN 81 MG PO TBEC: 81.0000 mg | DELAYED_RELEASE_TABLET | Freq: Every day | ORAL | 12 refills | Status: DC

## 2022-08-14 MED ORDER — TRAZODONE HCL 50 MG PO TABS
25.0000 mg | ORAL_TABLET | Freq: Every evening | ORAL | 3 refills | Status: DC | PRN
  Filled 2022-08-14: qty 30, 30d supply, fill #0

## 2022-08-14 MED ORDER — METHOCARBAMOL 500 MG PO TABS
500.0000 mg | ORAL_TABLET | Freq: Three times a day (TID) | ORAL | 0 refills | Status: AC | PRN
  Filled 2022-08-14: qty 21, 7d supply, fill #0

## 2022-08-14 MED ORDER — GLIPIZIDE 5 MG PO TABS
5.0000 mg | ORAL_TABLET | Freq: Two times a day (BID) | ORAL | 1 refills | Status: DC
  Filled 2022-08-14: qty 180, 90d supply, fill #0

## 2022-08-14 MED ORDER — EZETIMIBE 10 MG PO TABS
10.0000 mg | ORAL_TABLET | Freq: Every day | ORAL | 3 refills | Status: DC
  Filled 2022-08-14: qty 90, 90d supply, fill #0

## 2022-08-14 MED ORDER — ALBUTEROL SULFATE HFA 108 (90 BASE) MCG/ACT IN AERS
2.0000 | INHALATION_SPRAY | Freq: Four times a day (QID) | RESPIRATORY_TRACT | 12 refills | Status: DC | PRN
  Filled 2022-08-14: qty 18, 25d supply, fill #0

## 2022-08-14 MED ORDER — LOSARTAN POTASSIUM 50 MG PO TABS
50.0000 mg | ORAL_TABLET | Freq: Every day | ORAL | 2 refills | Status: DC
  Filled 2022-08-14: qty 30, 30d supply, fill #0
  Filled 2023-02-11: qty 30, 30d supply, fill #1

## 2022-08-14 MED ORDER — LOSARTAN POTASSIUM 50 MG PO TABS
50.0000 mg | ORAL_TABLET | Freq: Every day | ORAL | 2 refills | Status: DC
  Filled 2022-08-14: qty 30, 30d supply, fill #0

## 2022-08-14 MED ORDER — ALBUTEROL SULFATE HFA 108 (90 BASE) MCG/ACT IN AERS
2.0000 | INHALATION_SPRAY | Freq: Four times a day (QID) | RESPIRATORY_TRACT | 2 refills | Status: DC | PRN
  Filled 2022-08-14: qty 18, 25d supply, fill #0

## 2022-08-14 MED ORDER — ALBUTEROL SULFATE HFA 108 (90 BASE) MCG/ACT IN AERS
2.0000 | INHALATION_SPRAY | Freq: Four times a day (QID) | RESPIRATORY_TRACT | 12 refills | Status: DC | PRN
  Filled 2022-08-14: qty 8.5, 25d supply, fill #0
  Filled 2022-08-14: qty 6.7, 25d supply, fill #0

## 2022-08-14 MED ORDER — METHOCARBAMOL 500 MG PO TABS
500.0000 mg | ORAL_TABLET | Freq: Three times a day (TID) | ORAL | 0 refills | Status: DC | PRN
  Filled 2022-08-14: qty 21, 7d supply, fill #0

## 2022-08-14 NOTE — Assessment & Plan Note (Signed)
-   albuterol (PROVENTIL HFA) 108 (90 Base) MCG/ACT inhaler; Inhale 2 puffs into the lungs every 6 (six) hours as needed for wheezing.  Dispense: 18 g; Refill: 12  2. Type 2 diabetes mellitus with other specified complication, without long-term current use of insulin ([place])  - Ambulatory referral to Ophthalmology - POCT glycosylated hemoglobin (Hb A1C) - glipiZIDE (GLUCOTROL) 5 MG tablet; Take 1 tablet (5 mg total) by mouth 2 (two) times daily before a meal.  Dispense: 180 tablet; Refill: 1  3. Colon cancer screening  - Cologuard  Follow up:  Follow up in 3 months

## 2022-08-14 NOTE — Progress Notes (Signed)
$'@Patient'F$  ID: Tiffany Le, female    DOB: 1976/03/29, 47 y.o.   MRN: [id number]  Chief Complaint  Patient presents with   Follow-up         Referring provider: Fenton Foy, NP   HPI  Tiffany Le 47 y.o. 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 To the [facility] for bilateral leg swelling and fatigue.   Patient presents for a follow up. Currently compliant with all medications. Endorses monitoring meals and exercising intermittently throughout the week.  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    Denies f/c/s, n/v/d, hemoptysis, PND, leg swelling Denies chest pain or edema    Allergies  Allergen Reactions   Oxycodone Nausea And Vomiting and Other (See Comments)    Pt states she had dizziness   Penicillins Rash    As a baby     There is no immunization history on file for this patient.  Past Medical History:  Diagnosis Date   Coronary artery disease    CVA (cerebral vascular accident) ([place]) 09/2020   Diabetes mellitus without complication ([place])    Diabetic neuropathy ([place]) 02/2020   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   Hyperlipidemia age 54   Hypertension age 19   Hypocalcemia 11/2019   Hypokalemia 11/2019   Iron deficiency anemia    NSTEMI (non-ST elevated myocardial infarction) ([place]) 09/2020   Obesity    Proteinuria 12/09/2019   Pulmonary embolism ([place]) 09/2020    Tobacco History: Social History   Tobacco Use  Smoking Status Never  Smokeless Tobacco Never   Counseling given: Not Answered   Outpatient Encounter  Medications as of 08/14/2022  Medication Sig   aspirin EC 81 MG tablet Take 1 tablet (81 mg total) by mouth daily. Swallow whole.   [DISCONTINUED] traZODone (DESYREL) 50 MG tablet Take 0.5-1 tablets (25-50 mg total) by mouth at bedtime as needed for sleep.   albuterol (PROVENTIL HFA) 108 (90 Base) MCG/ACT inhaler Inhale 2 puffs into the lungs every 6 (six) hours as needed for wheezing.   aspirin EC 81 MG tablet Take 1 tablet (81 mg total) by mouth daily.   dapagliflozin propanediol (FARXIGA) 10 MG TABS tablet Take 1 tablet (10 mg total) by mouth daily. Need appointment for further refills   ezetimibe (ZETIA) 10 MG tablet Take 1 tablet (10 mg total) by mouth daily.   gabapentin (NEURONTIN) 300 MG capsule TAKE 1 CAPSULE(300 MG) BY MOUTH TWICE DAILY   glipiZIDE (GLUCOTROL) 5 MG tablet Take 1 tablet (5 mg total) by mouth 2 (two) times daily before a meal.   ibuprofen (ADVIL) 600 MG tablet Take 1 tablet (600 mg total) by mouth every 8 (eight) hours as needed.   insulin glargine (LANTUS SOLOSTAR) 100 UNIT/ML Solostar Pen Inject 8 Units into the skin 2 (two) times daily.   Insulin Pen Needle (B-D UF III MINI PEN NEEDLES) 31G X 5 MM MISC Use as directed with insulin.   losartan (COZAAR) 25 MG tablet Take 25 mg by mouth daily.   losartan (COZAAR) 50 MG tablet Take 1 tablet (50 mg total) by mouth at bedtime.   methocarbamol (ROBAXIN) 500 MG tablet Take 1 tablet (500 mg  total) by mouth every 8 (eight) hours as needed for muscle spasms.   nitroGLYCERIN (NITROSTAT) 0.4 MG SL tablet DISSOLVE 1 TABLET UNDER THE TONGUE EVERY 5 MINUTES FOR 3 DOSES AS NEEDED FOR CHEST PAIN   ondansetron (ZOFRAN-ODT) 4 MG disintegrating tablet Take 1 tablet (4 mg total) by mouth every 8 (eight) hours as needed for nausea or vomiting.   rosuvastatin (CRESTOR) 20 MG tablet Take 20 mg by mouth at bedtime.   rosuvastatin (CRESTOR) 40 MG tablet Take 1 tablet (40 mg total) by mouth daily.   Semaglutide, 2 MG/DOSE, (OZEMPIC, 2 MG/DOSE,) 8  MG/3ML SOPN Inject 2 mg into the skin once a week.   simvastatin (ZOCOR) 40 MG tablet Take 40 mg by mouth at bedtime.   torsemide (DEMADEX) 20 MG tablet TAKE 1 TABLET(20 MG) BY MOUTH TWICE DAILY   traZODone (DESYREL) 50 MG tablet Take 0.5-1 tablets (25-50 mg total) by mouth at bedtime as needed for sleep.   [DISCONTINUED] albuterol (PROVENTIL HFA) 108 (90 Base) MCG/ACT inhaler Inhale 2 puffs into the lungs every 6 (six) hours as needed for wheezing.   [DISCONTINUED] albuterol (PROVENTIL HFA) 108 (90 Base) MCG/ACT inhaler Inhale 2 puffs into the lungs every 6 (six) hours as needed for wheezing.   [DISCONTINUED] ezetimibe (ZETIA) 10 MG tablet Take 1 tablet (10 mg total) by mouth daily.   [DISCONTINUED] ezetimibe (ZETIA) 10 MG tablet Take 1 tablet (10 mg total) by mouth daily.   [DISCONTINUED] glipiZIDE (GLUCOTROL) 5 MG tablet Take 1 tablet (5 mg total) by mouth 2 (two) times daily before a meal.   [DISCONTINUED] glipiZIDE (GLUCOTROL) 5 MG tablet Take 1 tablet (5 mg total) by mouth 2 (two) times daily before a meal.   [DISCONTINUED] losartan (COZAAR) 50 MG tablet Take 1 tablet (50 mg total) by mouth at bedtime.   [DISCONTINUED] losartan (COZAAR) 50 MG tablet Take 1 tablet (50 mg total) by mouth at bedtime.   [DISCONTINUED] methocarbamol (ROBAXIN) 500 MG tablet Take 1 tablet (500 mg total) by mouth every 8 (eight) hours as needed for muscle spasms.   [DISCONTINUED] methocarbamol (ROBAXIN) 500 MG tablet Take 1 tablet (500 mg total) by mouth every 8 (eight) hours as needed for muscle spasms.   [DISCONTINUED] rosuvastatin (CRESTOR) 40 MG tablet Take 1 tablet (40 mg total) by mouth daily.   [DISCONTINUED] rosuvastatin (CRESTOR) 40 MG tablet Take 1 tablet (40 mg total) by mouth daily.   No facility-administered encounter medications on file as of 08/14/2022.     Review of Systems  Review of Systems  Constitutional: Negative.   HENT: Negative.    Cardiovascular: Negative.   Gastrointestinal: Negative.    Allergic/Immunologic: Negative.   Neurological: Negative.   Psychiatric/Behavioral: Negative.         Physical Exam  BP 124/75   Pulse 74   Temp 98.1 F (36.7 C)   Ht '5\' 4"'$  (1.626 m)   Wt 251 lb (113.9 kg)   SpO2 98%   BMI 43.08 kg/m   Wt Readings from Last 5 Encounters:  08/14/22 251 lb (113.9 kg)  05/16/22 250 lb (113.4 kg)  03/12/22 256 lb (116.1 kg)  12/20/21 260 lb 9.3 oz (118.2 kg)  11/28/21 258 lb 6.1 oz (117.2 kg)     Physical Exam Vitals and nursing note reviewed.  Constitutional:      General: She is not in acute distress.    Appearance: She is well-developed.  Cardiovascular:     Rate and Rhythm: Normal rate and regular rhythm.  Pulmonary:     Effort: Pulmonary effort is normal.     Breath sounds: Normal breath sounds.  Neurological:     Mental Status: She is alert and oriented to person, place, and time.      Lab Results:  CBC    Component Value Date/Time   WBC 17.8 (H) 01/07/2022 2342   RBC 4.23 01/07/2022 2342   HGB 11.9 (L) 01/07/2022 2342   HGB 11.7 08/20/2021 1112   HCT 37.2 01/07/2022 2342   HCT 35.3 08/20/2021 1112   PLT 268 01/07/2022 2342   PLT 319 08/20/2021 1112   MCV 87.9 01/07/2022 2342   MCV 84 08/20/2021 1112   MCH 28.1 01/07/2022 2342   MCHC 32.0 01/07/2022 2342   RDW 12.8 01/07/2022 2342   RDW 13.7 08/20/2021 1112   LYMPHSABS 1.2 01/07/2022 2342   LYMPHSABS 1.8 08/20/2021 1112   MONOABS 1.3 (H) 01/07/2022 2342   EOSABS 0.0 01/07/2022 2342   EOSABS 0.1 08/20/2021 1112   BASOSABS 0.0 01/07/2022 2342   BASOSABS 0.0 08/20/2021 1112    BMET    Component Value Date/Time   NA 139 01/07/2022 2342   NA 142 10/18/2021 1155   K 3.7 01/07/2022 2342   CL 97 (L) 01/07/2022 2342   CO2 29 01/07/2022 2342   GLUCOSE 177 (H) 01/07/2022 2342   BUN 14 01/07/2022 2342   BUN 20 10/18/2021 1155   CREATININE 1.06 (H) 01/07/2022 2342   CREATININE 0.57 01/12/2014 1140   CALCIUM 9.3 01/07/2022 2342   GFRNONAA >60 01/07/2022  2342   GFRAA 130 12/09/2019 1509    BNP    Component Value Date/Time   BNP 947.1 (H) 10/02/2020 0516    ProBNP    Component Value Date/Time   PROBNP 1,091 (H) 05/20/2021 1422    Imaging: No results found.   Assessment & Plan:   Shortness of breath - albuterol (PROVENTIL HFA) 108 (90 Base) MCG/ACT inhaler; Inhale 2 puffs into the lungs every 6 (six) hours as needed for wheezing.  Dispense: 18 g; Refill: 12  2. Type 2 diabetes mellitus with other specified complication, without long-term current use of insulin ([place])  - Ambulatory referral to Ophthalmology - POCT glycosylated hemoglobin (Hb A1C) - glipiZIDE (GLUCOTROL) 5 MG tablet; Take 1 tablet (5 mg total) by mouth 2 (two) times daily before a meal.  Dispense: 180 tablet; Refill: 1  3. Colon cancer screening  - Cologuard  Follow up:  Follow up in 3 months     Fenton Foy, NP 08/14/2022

## 2022-08-14 NOTE — Patient Instructions (Signed)
1. Shortness of breath  - albuterol (PROVENTIL HFA) 108 (90 Base) MCG/ACT inhaler; Inhale 2 puffs into the lungs every 6 (six) hours as needed for wheezing.  Dispense: 18 g; Refill: 12  2. Type 2 diabetes mellitus with other specified complication, without long-term current use of insulin ([place])  - Ambulatory referral to Ophthalmology - POCT glycosylated hemoglobin (Hb A1C) - glipiZIDE (GLUCOTROL) 5 MG tablet; Take 1 tablet (5 mg total) by mouth 2 (two) times daily before a meal.  Dispense: 180 tablet; Refill: 1  3. Colon cancer screening  - Cologuard  Follow up:  Follow up in 3 months

## 2022-08-14 NOTE — Assessment & Plan Note (Signed)
-   Ambulatory referral to Podiatry  2. Diabetic neuropathy, painful ([place])  - Ambulatory referral to Podiatry  3. Bilateral numbness and tingling of arms and legs  - Ambulatory referral to Neurology  Follow up:  Follow up in 3 months as scheduled

## 2022-08-15 ENCOUNTER — Other Ambulatory Visit: Payer: Self-pay

## 2022-08-15 LAB — CBC
Hematocrit: 39.6 % (ref 34.0–46.6)
Hemoglobin: 12.4 g/dL (ref 11.1–15.9)
MCH: 27.4 pg (ref 26.6–33.0)
MCHC: 31.3 g/dL — ABNORMAL LOW (ref 31.5–35.7)
MCV: 88 fL (ref 79–97)
Platelets: 184 10*3/uL (ref 150–450)
RBC: 4.52 x10E6/uL (ref 3.77–5.28)
RDW: 12.7 % (ref 11.7–15.4)
WBC: 6.9 10*3/uL (ref 3.4–10.8)

## 2022-08-15 LAB — COMPREHENSIVE METABOLIC PANEL
ALT: 17 IU/L (ref 0–32)
AST: 21 IU/L (ref 0–40)
Albumin/Globulin Ratio: 1.2 (ref 1.2–2.2)
Albumin: 4.4 g/dL (ref 3.9–4.9)
Alkaline Phosphatase: 85 IU/L (ref 44–121)
BUN/Creatinine Ratio: 14 (ref 9–23)
BUN: 14 mg/dL (ref 6–24)
Bilirubin Total: 0.2 mg/dL (ref 0.0–1.2)
CO2: 22 mmol/L (ref 20–29)
Calcium: 9.4 mg/dL (ref 8.7–10.2)
Chloride: 99 mmol/L (ref 96–106)
Creatinine, Ser: 1.03 mg/dL — ABNORMAL HIGH (ref 0.57–1.00)
Globulin, Total: 3.6 g/dL (ref 1.5–4.5)
Glucose: 164 mg/dL — ABNORMAL HIGH (ref 70–99)
Potassium: 4 mmol/L (ref 3.5–5.2)
Sodium: 142 mmol/L (ref 134–144)
Total Protein: 8 g/dL (ref 6.0–8.5)
eGFR: 68 mL/min/{1.73_m2} (ref >59)

## 2022-08-15 LAB — LIPID PANEL
Chol/HDL Ratio: 3.2 ratio (ref 0.0–4.4)
Cholesterol, Total: 121 mg/dL (ref 100–199)
HDL: 38 mg/dL — ABNORMAL LOW (ref >39)
LDL Chol Calc (NIH): 70 mg/dL (ref 0–99)
Triglycerides: 59 mg/dL (ref 0–149)
VLDL Cholesterol Cal: 13 mg/dL (ref 5–40)

## 2022-08-19 ENCOUNTER — Other Ambulatory Visit: Payer: Self-pay

## 2022-08-20 ENCOUNTER — Other Ambulatory Visit: Payer: Self-pay

## 2022-08-26 ENCOUNTER — Ambulatory Visit: Payer: Medicaid Other | Admitting: Neurology

## 2022-08-26 ENCOUNTER — Other Ambulatory Visit: Payer: Self-pay | Admitting: Nurse Practitioner

## 2022-08-26 DIAGNOSIS — I10 Essential (primary) hypertension: Secondary | ICD-10-CM

## 2022-08-26 NOTE — Telephone Encounter (Signed)
Please advise due to pt being dismissed from practice. Thank you.   Elyse Jarvis RMA

## 2022-08-27 ENCOUNTER — Encounter: Payer: Self-pay | Admitting: Neurology

## 2022-08-27 ENCOUNTER — Ambulatory Visit: Payer: Medicaid Other | Admitting: Neurology

## 2022-08-27 VITALS — BP 145/69 | HR 69 | Ht 64.0 in | Wt 251.0 lb

## 2022-08-27 DIAGNOSIS — E1142 Type 2 diabetes mellitus with diabetic polyneuropathy: Secondary | ICD-10-CM

## 2022-08-27 DIAGNOSIS — R202 Paresthesia of skin: Secondary | ICD-10-CM

## 2022-08-27 NOTE — Progress Notes (Signed)
Chief Complaint  Patient presents with   New Patient (Initial Visit)    Rm 14, alone NP/internal referral for bilateral numbness and tingling of arms and legs States right side is worse, tingling in both hands       ASSESSMENT AND PLAN  Tiffany Le is a 47 y.o. female   Bilateral hands paresthesia In the background of diabetic peripheral neuropathy  Most suggested of bilateral carpal tunnel syndrome  EMG nerve conduction study  Bilateral wrist explained   DIAGNOSTIC DATA (LABS, IMAGING, TESTING) - I reviewed patient records, labs, notes, testing and imaging myself where available.  MEDICAL HISTORY:  Tiffany Le is a 46 year old female, seen in request by her primary care nurse practitioner   [facility] Arms for evaluation of numbness tingling in bilateral hands, legs, initial evaluation was on August 27, 2022.    I reviewed and summarized the referring note. PMHx Asthma HTN DM,  DM Peripheral neuropathy HLD CAD History of pulmonary emboli Left femur fracture    She has longstanding history of disease, diabetic peripheral neuropathy, bilateral lower extremity paresthesia,  Around July 2023, she began to notice intermittent bilateral hand numbness, involving all 5 fingers, woke up noticed fingertips paresthesia, sometimes painful, also wake her up in the middle of the night, she has to shake her hand to make sensation comes back  She denies neck pain, denies gait abnormality, no bowel or bladder incontinence  Laboratory evaluations in January 2024: LDL 70, normal CMP creatinine 1.03, CBC, hemoglobin of 12.4, A1c 6.7   PHYSICAL EXAM:   Vitals:   08/27/22 1610  BP: (!) 145/69  Pulse: 69  Weight: 251 lb (113.9 kg)  Height: '5\' 4"'$  (1.626 m)   Not recorded     Body mass index is 43.08 kg/m.  PHYSICAL EXAMNIATION:  Gen: NAD, conversant, well nourised, well groomed                     Cardiovascular: Regular rate rhythm, no peripheral edema, warm,  nontender. Eyes: Conjunctivae clear without exudates or hemorrhage Neck: Supple, no carotid bruits. Pulmonary: Clear to auscultation bilaterally   NEUROLOGICAL EXAM:  MENTAL STATUS: Speech/cognition: Awake, alert, oriented to history taking and casual conversation CRANIAL NERVES: CN II: Visual fields are full to confrontation. Pupils are round equal and briskly reactive to light. CN III, IV, VI: extraocular movement are normal. No ptosis. CN V: Facial sensation is intact to light touch CN VII: Face is symmetric with normal eye closure  CN VIII: Hearing is normal to causal conversation. CN IX, X: Phonation is normal. CN XI: Head turning and shoulder shrug are intact  MOTOR: There is no pronator drift of out-stretched arms. Muscle bulk and tone are normal. Muscle strength is normal.  REFLEXES: Reflexes are 1 and symmetric at the biceps, triceps, knees, and ankles. Plantar responses are flexor.  SENSORY: Mild length-dependent decreased light touch, pinprick, vibratory sensation  COORDINATION: There is no trunk or limb dysmetria noted.  GAIT/STANCE: Posture is normal. Gait is steady with normal steps, base, arm swing, and turning. Heel and toe walking are normal. Tandem gait is normal.  Romberg is absent.  REVIEW OF SYSTEMS:  Full 14 system review of systems performed and notable only for as above All other review of systems were negative.   ALLERGIES: Allergies  Allergen Reactions   Oxycodone Nausea And Vomiting and Other (See Comments)    Pt states she had dizziness   Penicillins Rash    As  a baby    HOME MEDICATIONS: Current Outpatient Medications  Medication Sig Dispense Refill   albuterol (PROVENTIL HFA) 108 (90 Base) MCG/ACT inhaler Inhale 2 puffs into the lungs every 6 (six) hours as needed for wheezing. 8.5 g 12   albuterol (VENTOLIN HFA) 108 (90 Base) MCG/ACT inhaler Inhale 2 puffs into the lungs every 6 (six) hours as needed for wheezing or shortness of  breath. 18 g 2   aspirin EC 81 MG tablet Take 1 tablet (81 mg total) by mouth daily. 90 tablet 1   aspirin EC 81 MG tablet Take 1 tablet (81 mg total) by mouth daily. Swallow whole. 30 tablet 12   dapagliflozin propanediol (FARXIGA) 10 MG TABS tablet Take 1 tablet (10 mg total) by mouth daily. Need appointment for further refills 90 tablet 3   ezetimibe (ZETIA) 10 MG tablet Take 1 tablet (10 mg total) by mouth daily. 90 tablet 3   gabapentin (NEURONTIN) 300 MG capsule TAKE 1 CAPSULE(300 MG) BY MOUTH TWICE DAILY 60 capsule 2   glipiZIDE (GLUCOTROL) 5 MG tablet Take 1 tablet (5 mg total) by mouth 2 (two) times daily before a meal. 180 tablet 1   ibuprofen (ADVIL) 600 MG tablet Take 1 tablet (600 mg total) by mouth every 8 (eight) hours as needed. 30 tablet 0   insulin glargine (LANTUS SOLOSTAR) 100 UNIT/ML Solostar Pen Inject 8 Units into the skin 2 (two) times daily. 15 mL 0   Insulin Pen Needle (B-D UF III MINI PEN NEEDLES) 31G X 5 MM MISC Use as directed with insulin. 300 each 3   losartan (COZAAR) 50 MG tablet Take 1 tablet (50 mg total) by mouth at bedtime. 30 tablet 2   methocarbamol (ROBAXIN) 500 MG tablet Take 1 tablet (500 mg total) by mouth every 8 (eight) hours as needed for muscle spasms. 21 tablet 0   nitroGLYCERIN (NITROSTAT) 0.4 MG SL tablet DISSOLVE 1 TABLET UNDER THE TONGUE EVERY 5 MINUTES FOR 3 DOSES AS NEEDED FOR CHEST PAIN 25 tablet 5   ondansetron (ZOFRAN-ODT) 4 MG disintegrating tablet Take 1 tablet (4 mg total) by mouth every 8 (eight) hours as needed for nausea or vomiting. 20 tablet 0   rosuvastatin (CRESTOR) 40 MG tablet Take 1 tablet (40 mg total) by mouth daily. 90 tablet 3   Semaglutide, 2 MG/DOSE, (OZEMPIC, 2 MG/DOSE,) 8 MG/3ML SOPN Inject 2 mg into the skin once a week. 3 mL 11   torsemide (DEMADEX) 20 MG tablet TAKE 1 TABLET(20 MG) BY MOUTH TWICE DAILY 60 tablet 3   traZODone (DESYREL) 50 MG tablet Take 0.5-1 tablets (25-50 mg total) by mouth at bedtime as needed for  sleep. 30 tablet 3   No current facility-administered medications for this visit.    PAST MEDICAL HISTORY: Past Medical History:  Diagnosis Date   Coronary artery disease    CVA (cerebral vascular accident) ([place]) 09/2020   Diabetes mellitus without complication ([facility])    Diabetic neuropathy ([place]) 02/2020   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   Hyperlipidemia age 89   Hypertension age 7   Hypocalcemia 11/2019   Hypokalemia 11/2019   Iron deficiency anemia    NSTEMI (non-ST elevated myocardial infarction) ([place]) 09/2020   Obesity    Proteinuria 12/09/2019   Pulmonary embolism ([place]) 09/2020    PAST SURGICAL HISTORY: Past Surgical History:  Procedure Laterality Date   APPLICATION OF WOUND VAC N/A 12/14/2020   Procedure: APPLICATION OF WOUND VAC;  Surgeon: Rexene Alberts, MD;  [facility];  Service: Thoracic;  Laterality: N/A;   APPLICATION OF WOUND VAC N/A 12/25/2020   Procedure: WOUND VAC CHANGE;  Surgeon: [name], MD;  Location: [facility];  Service: Thoracic;  Laterality: N/A;   CARDIAC CATHETERIZATION  09/25/2020   CESAREAN SECTION  3/96   CLIPPING OF ATRIAL APPENDAGE N/A 10/09/2020   Procedure: CLIPPING OF ATRIAL APPENDAGE USING ATRICURE 35MM CLIP;  Surgeon: [name], MD;  Location: [facility];  Service: Open Heart Surgery;  Laterality: N/A;   CORONARY ARTERY BYPASS GRAFT N/A 10/09/2020   Procedure: CORONARY ARTERY BYPASS GRAFTING (CABG), ON PUMP, TIMES FOUR, USING LEFT INTERNAL MAMMARY ARTERY AND LEFT RADIAL ARTERY (OPEN HARVEST);  Surgeon: [name], MD;  Location: [facility];  Service: Open Heart Surgery;  Laterality: N/A;  POSSIBLE BIMA   INCISION AND DRAINAGE OF WOUND N/A 12/20/2020   Procedure: STERNAL WOUND DEBRIDEMENT WITH SKIN SUBSTITUTION AND ABRA;  Surgeon: Wallace Going, DO;  Location: [facility];  Service: Plastics;  Laterality: N/A;   IR THORACENTESIS ASP PLEURAL SPACE W/IMG GUIDE  10/16/2020   LEFT HEART CATH AND CORONARY ANGIOGRAPHY N/A 09/25/2020   Procedure: LEFT  HEART CATH AND CORONARY ANGIOGRAPHY;  Surgeon: Leonie Man, MD;  Location: [facility];  Service: Cardiovascular;  Laterality: N/A;   ORIF FEMUR FRACTURE     RADIAL ARTERY HARVEST Left 10/09/2020   Procedure: RADIAL ARTERY HARVEST;  Surgeon: [name], MD;  Location: [facility];  Service: Open Heart Surgery;  Laterality: Left;   STERNAL CLOSURE N/A 12/25/2020   Procedure: STERNAL CLOSURE;  Surgeon: [name], MD;  Location: [facility];  Service: Thoracic;  Laterality: N/A;   STERNAL WOUND DEBRIDEMENT N/A 12/14/2020   Procedure: STERNAL WOUND DEBRIDEMENT;  Surgeon: Rexene Alberts, MD;  Location: [facility];  Service: Thoracic;  Laterality: N/A;   STERNAL WOUND DEBRIDEMENT N/A 12/18/2020   Procedure: STERNAL WOUND DEBRIDEMENT, Wound Vac Change;  Surgeon: [name], MD;  Location: [facility];  Service: Thoracic;  Laterality: N/A;   STERNAL WOUND DEBRIDEMENT N/A 12/25/2020   Procedure: STERNAL WOUND IRRIGATION AND DEBRIDEMENT;  Surgeon: [name], MD;  [facility];  Service: Thoracic;  Laterality: N/A;   TEE WITHOUT CARDIOVERSION N/A 10/09/2020   Procedure: TRANSESOPHAGEAL ECHOCARDIOGRAM (TEE);  Surgeon: [name], MD;  Location: [facility];  Service: Open Heart Surgery;  Laterality: N/A;    FAMILY HISTORY: Family History  Problem Relation Age of Onset   Heart disease Mother        CABG <50   Hypertension Mother    Diabetes Mother    Hyperlipidemia Mother    Heart disease Father    Hyperlipidemia Father    Hypertension Father    Early death Father    Healthy Daughter    Diabetes Maternal Aunt    Cancer Maternal Grandmother        stomach cancer   Diabetes Maternal Aunt    Cancer Cousin        colon cancer (dx'd 65)    SOCIAL HISTORY: Social History   Socioeconomic History   Marital status: Single    Spouse name: Not on file   Number of children: Not on file   Years of education: 12   Highest education level: Some college, no degree  Occupational  History   Occupation: Event organiser  Tobacco Use   Smoking status: Never   Smokeless tobacco: Never  Vaping Use   Vaping Use: Never used  Substance and Sexual Activity   Alcohol use: No   Drug use: No   Sexual activity: Not Currently    Partners: Male    Comment: husband currently in [place], returning 02/2013  Other Topics Concern   Not on file  Social History Narrative   Works at a call center, 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  Lives with 59 year old daughter, 1 cat Husband is living in [place] (citizen there), trying to come to [place] (has been delayed)   Social Determinants of Health   Financial Resource Strain: High Risk (10/12/2020)   Overall Financial Resource Strain (CARDIA)    Difficulty of Paying Living Expenses: Hard  Food Insecurity: Food Insecurity Present (01/08/2021)   Hunger Vital Sign    Worried About Running Out of Food in the Last Year: Sometimes true    Ran Out of Food in the Last Year: Never true  Transportation Needs: No Transportation Needs (01/08/2021)   PRAPARE - Hydrologist (Medical): No    Lack of Transportation (Non-Medical): No  Physical Activity: Not on file  Stress: Stress Concern Present (10/12/2020)   [place]    Feeling of Stress : Rather much  Social Connections: Moderately Integrated (12/28/2020)   Social Connection and Isolation Panel [NHANES]    Frequency of Communication with Friends and Family: More than three times a week    Frequency of Social Gatherings with Friends and Family: More than three times a week    Attends Religious Services: 1 to 4 times per year    Active Member of [employer] or Organizations: Yes    Attends Archivist Meetings: 1 to 4 times per year    Marital Status: Never married  Intimate Partner Violence: Not At Risk (01/08/2021)   Humiliation, Afraid, Rape, and Kick questionnaire    Fear of Current or Ex-Partner:  No    Emotionally Abused: No    Physically Abused: No    Sexually Abused: No      Marcial Pacas, M.D. Ph.D.  [facility] [address] [place], [place] [address] Ph: [phone] Fax: [phone]  CC:  Fenton Foy, NP [place],  [place] [address]  Fenton Foy, NP

## 2022-08-28 ENCOUNTER — Other Ambulatory Visit: Payer: Self-pay

## 2022-09-03 ENCOUNTER — Telehealth: Payer: Self-pay | Admitting: Neurology

## 2022-09-03 ENCOUNTER — Encounter: Payer: Medicaid Other | Admitting: Neurology

## 2022-09-03 ENCOUNTER — Encounter: Payer: Self-pay | Admitting: Neurology

## 2022-09-03 NOTE — Telephone Encounter (Signed)
Pt is asking for a call to discuss the r/s of her NCS

## 2022-09-04 NOTE — Telephone Encounter (Signed)
Do not reschedule her, unless herself call back, concern for DM peripheral neuropathy and bilateral CTSs.  Put her on schedule at the end of the day

## 2022-09-08 ENCOUNTER — Other Ambulatory Visit: Payer: Self-pay

## 2022-09-10 LAB — COLOGUARD: COLOGUARD: NEGATIVE

## 2022-09-12 ENCOUNTER — Other Ambulatory Visit: Payer: Self-pay

## 2022-10-16 ENCOUNTER — Other Ambulatory Visit: Payer: Self-pay

## 2022-10-23 ENCOUNTER — Other Ambulatory Visit: Payer: Self-pay

## 2022-10-24 ENCOUNTER — Other Ambulatory Visit: Payer: Self-pay

## 2022-11-13 ENCOUNTER — Other Ambulatory Visit: Payer: Self-pay | Admitting: Internal Medicine

## 2022-11-19 ENCOUNTER — Other Ambulatory Visit: Payer: Self-pay | Admitting: Internal Medicine

## 2022-11-20 MED ORDER — ROSUVASTATIN CALCIUM 40 MG PO TABS: 40.0000 mg | ORAL_TABLET | Freq: Every day | ORAL | 1 refills | Status: DC

## 2022-11-24 NOTE — Progress Notes (Unsigned)
Office Visit    Patient Name: Tiffany Le Date of Encounter: 11/25/2022  Primary Care Provider:  Ivonne Andrew, NP Primary Cardiologist:  Orbie Pyo, MD Primary Electrophysiologist: None  Chief Complaint    Tiffany Le is a 47 y.o. female with PMH of CAD and STEMI 09/2020 with three-vessel disease s/p CABG x 4, HTN, HLD, CVA, DM type II, obesity, ICM, HFrEF who presents today for overdue follow-up of CAD and HFrEF.  Past Medical History    Past Medical History:  Diagnosis Date   Coronary artery disease    CVA (cerebral vascular accident) 09/2020   Diabetes mellitus without complication    Diabetic neuropathy 02/2020   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   Hyperlipidemia age 42   Hypertension age 85   Hypocalcemia 11/2019   Hypokalemia 11/2019   Iron deficiency anemia    NSTEMI (non-ST elevated myocardial infarction) 09/2020   Obesity    Proteinuria 12/09/2019   Pulmonary embolism 09/2020   Past Surgical History:  Procedure Laterality Date   APPLICATION OF WOUND VAC N/A 12/14/2020   Procedure: APPLICATION OF WOUND VAC;  Surgeon: Purcell Nails, MD;  Location: [facility] OR;  Service: Thoracic;  Laterality: N/A;   APPLICATION OF WOUND VAC N/A 12/25/2020   Procedure: WOUND VAC CHANGE;  Surgeon: Linden Dolin, MD;  Location: [facility] OR;  Service: Thoracic;  Laterality: N/A;   CARDIAC CATHETERIZATION  09/25/2020   CESAREAN SECTION  3/96   CLIPPING OF ATRIAL APPENDAGE N/A 10/09/2020   Procedure: CLIPPING OF ATRIAL APPENDAGE USING ATRICURE CLIP;  Surgeon: Linden Dolin, MD;  Location: [facility] OR;  Service: Open Heart Surgery;  Laterality: N/A;   CORONARY ARTERY BYPASS GRAFT N/A 10/09/2020   Procedure: CORONARY ARTERY BYPASS GRAFTING (CABG), ON PUMP, TIMES FOUR, USING LEFT INTERNAL MAMMARY ARTERY AND LEFT RADIAL ARTERY (OPEN HARVEST);  Surgeon: Linden Dolin, MD;  Location: [facility] OR;  Service: Open Heart Surgery;  Laterality: N/A;  POSSIBLE BIMA   INCISION AND DRAINAGE OF WOUND N/A  12/20/2020   Procedure: STERNAL WOUND DEBRIDEMENT WITH SKIN SUBSTITUTION AND ABRA;  Surgeon: Peggye Form, DO;  Location: [facility] OR;  Service: Plastics;  Laterality: N/A;   IR THORACENTESIS ASP PLEURAL SPACE W/IMG GUIDE  10/16/2020   LEFT HEART CATH AND CORONARY ANGIOGRAPHY N/A 09/25/2020   Procedure: LEFT HEART CATH AND CORONARY ANGIOGRAPHY;  Surgeon: Marykay Lex, MD;  Location: [facility] INVASIVE CV LAB;  Service: Cardiovascular;  Laterality: N/A;   ORIF FEMUR FRACTURE     RADIAL ARTERY HARVEST Left 10/09/2020   Procedure: RADIAL ARTERY HARVEST;  Surgeon: Linden Dolin, MD;  Location: [facility];  Service: Open Heart Surgery;  Laterality: Left;   STERNAL CLOSURE N/A 12/25/2020   Procedure: STERNAL CLOSURE;  Surgeon: Linden Dolin, MD;  Location: [facility];  Service: Thoracic;  Laterality: N/A;   STERNAL WOUND DEBRIDEMENT N/A 12/14/2020   Procedure: STERNAL WOUND DEBRIDEMENT;  Surgeon: Purcell Nails, MD;  Location: [facility] OR;  Service: Thoracic;  Laterality: N/A;   STERNAL WOUND DEBRIDEMENT N/A 12/18/2020   Procedure: STERNAL WOUND DEBRIDEMENT, Wound Vac Change;  Surgeon: Linden Dolin, MD;  Location: [facility];  Service: Thoracic;  Laterality: N/A;   STERNAL WOUND DEBRIDEMENT N/A 12/25/2020   Procedure: STERNAL WOUND IRRIGATION AND DEBRIDEMENT;  Surgeon: Linden Dolin, MD;  Location: [facility];  Service: Thoracic;  Laterality: N/A;   TEE WITHOUT CARDIOVERSION N/A 10/09/2020   Procedure: TRANSESOPHAGEAL ECHOCARDIOGRAM (TEE);  Surgeon: Vickey Sages,  Merri Brunette, MD;  Location: [facility] OR;  Service: Open Heart Surgery;  Laterality: N/A;    Allergies  Allergies  Allergen Reactions   Oxycodone Nausea And Vomiting and Other (See Comments)    Pt states she had dizziness   Penicillins Rash    As a baby    History of Present Illness    Tiffany Le  is a 47 year old female with the above mention past medical history who presents today for follow-up of HFrEF and CAD.  This also was initially seen in 09/2020 for  NSTEMI.  She had LHC performed that showed severe multivessel w/ mid LAD 95% followed by 70%, bifurcation high OM1 95%, bifurcation LCx-OM2 (65-70%), small caliber codominant RCA with 85% mid stenosis. LVEDP was elevated on cath at 33 mmHg.  She was referred to CVTS for bypass but patient left AMA on 2/17.  She presented back to the ED 2/22 by EMS and was in acute hypoxic respiratory failure with pulmonary edema.  She was readmitted to the CCU and AHF team was consulted and medications were optimized prior to undergoing CABG.  She underwent procedure 10/09/2020 and required milrinone postop and was weaned.  2D echo was repeated with EF of 50-55% and patient had complicated recovery due to pleural effusion that required thoracentesis.  She also suffered a subacute cerebral infarct and a remote lacunar infarct involving the right thalamus.  Neurology was consulted and patient was recommended to wear a 30-day monitor.  She was seen by Dr.Thukkani to establish care on 09/16/2021 and had complained of fatigue.  She had no complaints of chest pain.  She was referred to cardiac rehab due to deconditioning related to shortness of breath.  She was seen on 10/22/2021 for follow-up and patient had increased peripheral edema with constipation.  She was referred to the clinical pharmacist for titration of CHF medications.  She was started on Ozempic for weight control and diabetes.  Plavix was stopped and ASA was continued.  Since last being seen in the office patient reports she has been doing well and has not experienced any chest pain or anginal equivalent since previous visit.  She is currently working as a Estate manager/land agent and reports doing frequent walking and taking stairs on her job.  She tries to eat a heart healthy diet but has some indiscretions with salt and fast foods.  She has removed processed foods such as hot dogs and is no longer eating pork.  She reports frequent shortness of breath when walking on her job with  occasional swelling in her lower extremities if she misses her torsemide.  She also endorses some occasional episodes of numbness in her hands bilaterally.  She is planning to follow-up with her neurologist next month.  She is compliant with her medications and denies any adverse reactions.  Her blood pressure today is well-controlled at 132/84.  Patient denies chest pain, palpitations, dyspnea, PND, orthopnea, nausea, vomiting, dizziness, syncope, edema, weight gain, or early satiety.   Home Medications    Current Outpatient Medications  Medication Sig Dispense Refill   albuterol (PROVENTIL HFA) 108 (90 Base) MCG/ACT inhaler Inhale 2 puffs into the lungs every 6 (six) hours as needed for wheezing. 8.5 g 12   albuterol (VENTOLIN HFA) 108 (90 Base) MCG/ACT inhaler Inhale 2 puffs into the lungs every 6 (six) hours as needed for wheezing or shortness of breath. 18 g 2   aspirin EC 81 MG tablet Take 1 tablet (81 mg total) by  mouth daily. 90 tablet 1   dapagliflozin propanediol (FARXIGA) 10 MG TABS tablet Take 1 tablet (10 mg total) by mouth daily. Need appointment for further refills 90 tablet 3   ezetimibe (ZETIA) 10 MG tablet Take 1 tablet (10 mg total) by mouth daily. 90 tablet 3   gabapentin (NEURONTIN) 300 MG capsule TAKE 1 CAPSULE(300 MG) BY MOUTH TWICE DAILY 60 capsule 2   glipiZIDE (GLUCOTROL) 5 MG tablet Take 1 tablet (5 mg total) by mouth 2 (two) times daily before a meal. 180 tablet 1   ibuprofen (ADVIL) 600 MG tablet Take 1 tablet (600 mg total) by mouth every 8 (eight) hours as needed. 30 tablet 0   insulin glargine (LANTUS SOLOSTAR) 100 UNIT/ML Solostar Pen Inject 8 Units into the skin 2 (two) times daily. 15 mL 0   Insulin Pen Needle (B-D UF III MINI PEN NEEDLES) 31G X 5 MM MISC Use as directed with insulin. 300 each 3   losartan (COZAAR) 50 MG tablet Take 1 tablet (50 mg total) by mouth at bedtime. 30 tablet 2   methocarbamol (ROBAXIN) 500 MG tablet Take 1 tablet (500 mg total) by mouth  every 8 (eight) hours as needed for muscle spasms. 21 tablet 0   nitroGLYCERIN (NITROSTAT) 0.4 MG SL tablet DISSOLVE 1 TABLET UNDER THE TONGUE EVERY 5 MINUTES FOR 3 DOSES AS NEEDED FOR CHEST PAIN 25 tablet 5   ondansetron (ZOFRAN-ODT) 4 MG disintegrating tablet Take 1 tablet (4 mg total) by mouth every 8 (eight) hours as needed for nausea or vomiting. 20 tablet 0   rosuvastatin (CRESTOR) 40 MG tablet Take 1 tablet (40 mg total) by mouth daily. 90 tablet 1   Semaglutide, 2 MG/DOSE, (OZEMPIC, 2 MG/DOSE,) 8 MG/3ML SOPN Inject 2 mg into the skin once a week. 3 mL 11   torsemide (DEMADEX) 20 MG tablet TAKE 1 TABLET(20 MG) BY MOUTH TWICE DAILY 60 tablet 3   traZODone (DESYREL) 50 MG tablet Take 0.5-1 tablets (25-50 mg total) by mouth at bedtime as needed for sleep. 30 tablet 3   No current facility-administered medications for this visit.     Review of Systems  Please see the history of present illness.    (+) Shortness of breath (+) Forgetfulness and balance issues  All other systems reviewed and are otherwise negative except as noted above.  Physical Exam    Wt Readings from Last 3 Encounters:  11/25/22 250 lb 9.6 oz (113.7 kg)  08/27/22 251 lb (113.9 kg)  08/14/22 251 lb (113.9 kg)   VS: Vitals:   11/25/22 1020  BP: 132/84  Pulse: 71  ,Body mass index is 43.02 kg/m.  Constitutional:      Appearance: Healthy appearance. Not in distress.  Neck:     Vascular: JVD normal.  Pulmonary:     Effort: Pulmonary effort is normal.     Breath sounds: No wheezing. No rales. Diminished in the bases Cardiovascular:     Normal rate. Regular rhythm. Normal S1. Normal S2.      Murmurs: There is no murmur.  Edema:    Peripheral edema absent.  Abdominal:     Palpations: Abdomen is soft non tender. There is no hepatomegaly.  Skin:    General: Skin is warm and dry.  Neurological:     General: No focal deficit present.     Mental Status: Alert and oriented to person, place and time.      Cranial Nerves: Cranial nerves are intact.  EKG/LABS/ Recent Cardiac  Studies    ECG personally reviewed by me today -sinus rhythm with TWI in aVL with rate of 71 bpm and no acute changes consistent with previous EKG.   Lab Results  Component Value Date   WBC 6.9 08/14/2022   HGB 12.4 08/14/2022   HCT 39.6 08/14/2022   MCV 88 08/14/2022   PLT 184 08/14/2022   Lab Results  Component Value Date   CREATININE 1.03 (H) 08/14/2022   BUN 14 08/14/2022   NA 142 08/14/2022   K 4.0 08/14/2022   CL 99 08/14/2022   CO2 22 08/14/2022   Lab Results  Component Value Date   ALT 17 08/14/2022   AST 21 08/14/2022   ALKPHOS 85 08/14/2022   BILITOT 0.2 08/14/2022   Lab Results  Component Value Date   CHOL 121 08/14/2022   HDL 38 (L) 08/14/2022   LDLCALC 70 08/14/2022   TRIG 59 08/14/2022   CHOLHDL 3.2 08/14/2022    Lab Results  Component Value Date   HGBA1C 6.7 (A) 08/14/2022    Cardiac Studies & Procedures   CARDIAC CATHETERIZATION  CARDIAC CATHETERIZATION 09/26/2020  Narrative  Prox LAD to Mid LAD lesion is 95% stenosed. Mid LAD is 70% stenosed  Dist LAD lesion is 50% stenosed.  Ramus-1 lesion is 95% stenosed with 70% stenosed side branch in Lat Ramus. Beyond the bifurcation, Ramus-2 lesion is 60% stenosis  Prox Cx to Dist Cx lesion is 65% stenosed with 70% stenosed side branch in 1st Mrg.  LPAV lesion is 70% stenosed.  Prox RCA-1 lesion is 60% stenosed.  Prox RCA-2 lesion is 85% stenosed.  There is mild to moderate left ventricular systolic dysfunction. EF estimated 40 to 45%  LV end diastolic pressure is severely elevated. 33 mmHg  There is no aortic valve stenosis.  SUMMARY  Severe multivessel CAD: Early mid LAD 95% followed by 70%, bifurcation high OM1 95%, bifurcation LCx-OM2 (65-70%), small caliber codominant RCA with 85% mid stenosis  ACUTE COMBINED SYSTOLIC AND DIASTOLIC HEART FAILURE  mild to moderate reduced EF of roughly 40 to 45% with apical  akinesis as well as anterior wall hypokinesis.  severely elevated LVEDP of 33 mmHg   RECOMMENDATIONS  Restart IV heparin 8 hours after sheath removal.  CVTS consultation given diabetes and multivessel CAD.  PCI options would be LAD stenting of both lesions and then stenting of the high OM/ramus across the sidebranch with provisional PTCA; would treat the LCx and midRCA medically    Bryan Lemma, MD  Findings Coronary Findings Diagnostic  Dominance: Co-dominant  Left Anterior Descending Prox LAD to Mid LAD lesion is 95% stenosed. Vessel is the culprit lesion. The lesion is focal, eccentric and irregular. Tapers to a focal lesion Mid LAD lesion is 70% stenosed. The lesion is distal to major branch, focal and eccentric. Dist LAD lesion is 50% stenosed. The lesion is segmental and concentric.  First Diagonal Branch Vessel is small in size.  First Septal Branch Vessel is small in size.  Second Diagonal Branch Vessel is small in size.  Second Septal Branch Vessel is small in size.  Third Diagonal Branch Vessel is small in size.  Third Septal Branch Vessel is small in size.  Ramus Intermedius Vessel is large. Ramus-1 lesion is 95% stenosed with 70% stenosed side branch in Lat Ramus. The lesion is located at the bifurcation and eccentric. Ramus-2 lesion is 60% stenosed. The lesion is eccentric.  Lateral Ramus Intermedius Vessel is small in size.  Left Circumflex Prox Cx  to Dist Cx lesion is 65% stenosed with 70% stenosed side branch in 1st Mrg. The lesion is located at the bifurcation and concentric. The lesion is calcified.  First Obtuse Marginal Branch Vessel is small in size.  Left Posterior Atrioventricular Artery LPAV lesion is 70% stenosed. The lesion is eccentric.  Right Coronary Artery Vessel was injected. Vessel is small. There is moderate diffuse disease throughout the vessel. The vessel is mildly calcified. The vessel is moderately tortuous. Prox  RCA-1 lesion is 60% stenosed. The lesion is eccentric. Prox RCA-2 lesion is 85% stenosed. The lesion is focal, discrete and irregular.  Acute Marginal Branch Vessel is small in size.  Right Ventricular Branch Vessel is small in size.  Right Posterior Descending Artery Vessel is small in size. There is moderate disease in the vessel.  Intervention  No interventions have been documented.     ECHOCARDIOGRAM  ECHOCARDIOGRAM COMPLETE BUBBLE STUDY 12/05/2021  Narrative ECHOCARDIOGRAM REPORT    Patient Name:   GOLDIA LIGMAN Date of Exam: 12/05/2021 Medical Rec #:  [id number]        Height:       65.0 in Accession #:    [id number]       Weight:       258.4 lb Date of Birth:  Oct 25, 1975        BSA:          2.206 m Patient Age:    45 years         BP:           126/70 mmHg Patient Gender: F                HR:           63 bpm. Exam Location:  [address]  Procedure: 2D Echo, Cardiac Doppler, Color Doppler, Saline Contrast Bubble Study and Intracardiac Opacification Agent  Indications:    I63.9 Stroke  History:        Patient has prior history of Echocardiogram examinations, most recent 09/09/2021. CHF, Previous Myocardial Infarction and CAD, Prior CABG, Signs/Symptoms:Shortness of Breath; Risk Factors:Hypertension, Dyslipidemia and Diabetes. Obesity. Pulmonary edema. Anemia. Pulmonary embolism.  Sonographer:    Cathie Beams RCS Referring Phys: [id number] Orbie Pyo  IMPRESSIONS   1. Left ventricular ejection fraction, by estimation, is 55 to 60%. The left ventricle has normal function. The left ventricle has no regional wall motion abnormalities. There is mild concentric left ventricular hypertrophy. Left ventricular diastolic parameters are consistent with Grade I diastolic dysfunction (impaired relaxation). 2. Right ventricular systolic function is mildly reduced. The right ventricular size is normal. Tricuspid regurgitation signal is inadequate for assessing PA  pressure. 3. The mitral valve is normal in structure. No evidence of mitral valve regurgitation. No evidence of mitral stenosis. 4. The aortic valve is tricuspid. Aortic valve regurgitation is not visualized. No aortic stenosis is present. 5. The inferior vena cava is normal in size with greater than 50% respiratory variability, suggesting right atrial pressure of 3 mmHg. 6. Negative bubble study at rest and with valsalva.  FINDINGS Left Ventricle: Left ventricular ejection fraction, by estimation, is 55 to 60%. The left ventricle has normal function. The left ventricle has no regional wall motion abnormalities. The left ventricular internal cavity size was normal in size. There is mild concentric left ventricular hypertrophy. Left ventricular diastolic parameters are consistent with Grade I diastolic dysfunction (impaired relaxation).  Right Ventricle: The right ventricular size is normal. No increase in right ventricular wall thickness.  Right ventricular systolic function is mildly reduced. Tricuspid regurgitation signal is inadequate for assessing PA pressure.  Left Atrium: Left atrial size was normal in size.  Right Atrium: Right atrial size was normal in size.  Pericardium: There is no evidence of pericardial effusion.  Mitral Valve: The mitral valve is normal in structure. No evidence of mitral valve regurgitation. No evidence of mitral valve stenosis.  Tricuspid Valve: The tricuspid valve is normal in structure. Tricuspid valve regurgitation is not demonstrated.  Aortic Valve: The aortic valve is tricuspid. Aortic valve regurgitation is not visualized. No aortic stenosis is present.  Pulmonic Valve: The pulmonic valve was normal in structure. Pulmonic valve regurgitation is trivial.  Aorta: The aortic root is normal in size and structure.  Venous: The inferior vena cava is normal in size with greater than 50% respiratory variability, suggesting right atrial pressure of 3  mmHg.  IAS/Shunts: Negative bubble study at rest and with valsalva.   LEFT VENTRICLE PLAX 2D LVIDd:         3.30 cm   Diastology LVIDs:         2.50 cm   LV e' medial:    6.30 cm/s LV PW:         1.50 cm   LV E/e' medial:  10.6 LV IVS:        1.10 cm   LV e' lateral:   10.10 cm/s LVOT diam:     2.25 cm   LV E/e' lateral: 6.6 LV SV:         71 LV SV Index:   32 LVOT Area:     3.98 cm   RIGHT VENTRICLE RV Basal diam:  3.30 cm RV S prime:     7.95 cm/s TAPSE (M-mode): 1.0 cm  LEFT ATRIUM             Index        RIGHT ATRIUM           Index LA diam:        3.00 cm 1.36 cm/m   RA Area:     13.30 cm LA Vol (A2C):   70.0 ml 31.74 ml/m  RA Volume:   31.60 ml  14.33 ml/m LA Vol (A4C):   44.7 ml 20.27 ml/m LA Biplane Vol: 58.2 ml 26.39 ml/m AORTIC VALVE LVOT Vmax:   79.30 cm/s LVOT Vmean:  51.100 cm/s LVOT VTI:    0.179 m  AORTA Ao Root diam: 3.00 cm Ao Asc diam:  3.10 cm  MITRAL VALVE MV Area (PHT): 3.21 cm    SHUNTS MV Decel Time: 236 msec    Systemic VTI:  0.18 m MV E velocity: 66.50 cm/s  Systemic Diam: 2.25 cm MV A velocity: 67.40 cm/s MV E/A ratio:  0.99  Dalton McleanMD Electronically signed by Wilfred Lacy Signature Date/Time: 12/05/2021/5:14:21 PM    Final   TEE  ECHO INTRAOPERATIVE TEE 10/10/2020  Narrative *INTRAOPERATIVE TRANSESOPHAGEAL REPORT *    Patient Name:   Michela Pitcher Date of Exam: 10/09/2020 Medical Rec #:  [id number]        Height:       64.0 in Accession #:    [id number]       Weight:       246.7 lb Date of Birth:  [birth year]        BSA:          2.14 m Patient Age:    44 years  BP:           125/75 mmHg Patient Gender: F                HR:           81 bpm. Exam Location:  Anesthesiology  Transesophogeal exam was perform intraoperatively during surgical procedure. Patient was closely monitored under general anesthesia during the entirety of examination.  Indications:     Coronary Artery Disease Sonographer:      Eulah Pont RDCS Performing Phys: [id number] [facility] ATKINS Diagnosing Phys: Achille Rich MD  Complications: No known complications during this procedure. POST-OP IMPRESSIONS Overall, there were no significant changes from pre-bypass.  PRE-OP FINDINGS Left Ventricle: The left ventricle has mild-moderately reduced systolic function, with an ejection fraction of 40-45%. The cavity size was normal. There is mildly increased left ventricular wall thickness. Severe hypokinesis of the left ventricular apex. Severe hypokinesis of the left ventricular, mid-apical anterior wall. Severe hypokinesis of the left ventricular, mid-apical anteroseptal wall. There is mild concentric left ventricular hypertrophy.   Right Ventricle: The right ventricle has normal systolic function. The cavity was normal. There is no increase in right ventricular wall thickness. Right ventricular systolic pressure is normal.  Left Atrium: Left atrial size was normal in size. No left atrial/left atrial appendage thrombus was detected.  Right Atrium: Right atrial size was normal in size.  Interatrial Septum: No atrial level shunt detected by color flow Doppler.  Pericardium: Trivial pericardial effusion is present.  Mitral Valve: The mitral valve is normal in structure. Mitral valve regurgitation is trivial by color flow Doppler.  Tricuspid Valve: The tricuspid valve was normal in structure. Tricuspid valve regurgitation is mild by color flow Doppler.  Aortic Valve: The aortic valve is normal in structure. Aortic valve regurgitation was not visualized by color flow Doppler. There is no stenosis of the aortic valve.   Pulmonic Valve: The pulmonic valve was normal in structure. Pulmonic valve regurgitation is not visualized by color flow Doppler.   +---------------+---------++ RIGHT VENTRICLE          +---------------+---------++ RVSP:          40.2  mmHg +---------------+---------++  +------------+----------+++ RIGHT ATRIUM           +------------+----------+++ RA Pressure:19.00 mmHg +------------+----------+++ +---------------+-----------++ TRICUSPID VALVE            +---------------+-----------++ TR Peak grad:  21.2 mmHg   +---------------+-----------++ TR Vmax:       230.00 cm/s +---------------+-----------++ Estimated RAP: 19.00 mmHg  +---------------+-----------++ RVSP:          40.2 mmHg   +---------------+-----------++   Achille Rich MD Electronically signed by Achille Rich MD Signature Date/Time: 10/10/2020/8:25:10 PM    Final            Assessment & Plan    1.  Chronic combined CHF: -2D echo completed in 11/2021 with improved EF of 55 to 60% -Today patient reports shortness of breath activity and occasional lower extremity swelling. -We will repeat 2D echo to evaluate for valvular or structural heart changes. -Continue Farxiga 10 mg, losartan 50 mg daily, and torsemide 20 mg twice daily. -Low sodium diet, fluid restriction <2L, and daily weights encouraged. Educated to contact our office for weight gain of 2 lbs overnight or 5 lbs in one week.   2.  Coronary artery disease: -s/p CABG x 4 in 2022 following NSTEMI  -Today patient reports no recurrence of chest pain or anginal equivalent since previous visit. -She is  currently not very active and is interested in increasing her physical activity. -We will refer her to the PREP program. -Continue GDMT with ASA 81 mg, Zetia 10 mg, Nitrostat 0.4 mg as needed, Crestor 40 mg daily  3.  Hyperlipidemia: -Patient's LDL cholesterol was improved at 70 -Continue Zetia and Crestor as noted above  4.  History of CVA: -MRI showed remote lacunar infarct of right thalamus and subacute ischemic infarctions bilateral cerebral white matter and pons  -Today patient reports increasing incidence of numbness in her hands and difficulty with  finding words when speaking.  She also endorses some short-term memory deficits. -She is scheduled for follow-up with her neurologist and will discuss further -Continue ASA 81 mg and Crestor, Zetia as noted above  5.  Essential hypertension: -Patient's blood pressure was controlled at 132/84 -Continue losartan 50 mg daily   Disposition: Follow-up with Orbie Pyo, MD or APP in 6 months    Medication Adjustments/Labs and Tests Ordered: Current medicines are reviewed at length with the patient today.  Concerns regarding medicines are outlined above.   Signed, Napoleon Form, Leodis Rains, NP 11/25/2022, 10:50 AM [facility]  Note:  This document was prepared using Dragon voice recognition software and may include unintentional dictation errors.

## 2022-11-25 ENCOUNTER — Ambulatory Visit: Payer: Medicaid Other | Attending: Nurse Practitioner | Admitting: Nurse Practitioner

## 2022-11-25 ENCOUNTER — Encounter: Payer: Self-pay | Admitting: Nurse Practitioner

## 2022-11-25 ENCOUNTER — Ambulatory Visit: Payer: Medicaid Other

## 2022-11-25 VITALS — BP 132/84 | HR 71 | Ht 64.0 in | Wt 250.6 lb

## 2022-11-25 DIAGNOSIS — E7800 Pure hypercholesterolemia, unspecified: Secondary | ICD-10-CM | POA: Diagnosis not present

## 2022-11-25 DIAGNOSIS — I25110 Atherosclerotic heart disease of native coronary artery with unstable angina pectoris: Secondary | ICD-10-CM | POA: Diagnosis not present

## 2022-11-25 DIAGNOSIS — E1142 Type 2 diabetes mellitus with diabetic polyneuropathy: Secondary | ICD-10-CM | POA: Diagnosis not present

## 2022-11-25 DIAGNOSIS — I5043 Acute on chronic combined systolic (congestive) and diastolic (congestive) heart failure: Secondary | ICD-10-CM

## 2022-11-25 DIAGNOSIS — I63133 Cerebral infarction due to embolism of bilateral carotid arteries: Secondary | ICD-10-CM | POA: Diagnosis not present

## 2022-11-25 NOTE — Patient Instructions (Addendum)
Medication Instructions:  Your physician recommends that you continue on your current medications as directed. Please refer to the Current Medication list given to you today. *If you need a refill on your cardiac medications before your next appointment, please call your pharmacy*   Lab Work: TODAY-BMET & TSH If you have labs (blood work) drawn today and your tests are completely normal, you will receive your results only by: MyChart Message (if you have MyChart) OR A paper copy in the mail If you have any lab test that is abnormal or we need to change your treatment, we will call you to review the results.   Testing/Procedures: Your physician has requested that you have an echocardiogram. Echocardiography is a painless test that uses sound waves to create images of your heart. It provides your doctor with information about the size and shape of your heart and how well your heart's chambers and valves are working. This procedure takes approximately one hour. There are no restrictions for this procedure. Please do NOT wear cologne, perfume, aftershave, or lotions (deodorant is allowed). Please arrive 15 minutes prior to your appointment time.   Follow-Up: At [facility], you and your health needs are our priority.  As part of our continuing mission to provide you with exceptional heart care, we have created designated Provider Care Teams.  These Care Teams include your primary Cardiologist (physician) and Advanced Practice Providers (APPs -  Physician Assistants and Nurse Practitioners) who all work together to provide you with the care you need, when you need it.  We recommend signing up for the patient portal called "MyChart".  Sign up information is provided on this After Visit Summary.  MyChart is used to connect with patients for Virtual Visits (Telemedicine).  Patients are able to view lab/test results, encounter notes, upcoming appointments, etc.  Non-urgent messages can be sent  to your provider as well.   To learn more about what you can do with MyChart, go to [email or web address].    Your next appointment:   6 month(s)  Provider:   Orbie Pyo, MD     Other Instructions  PREP PROGRAM handout given

## 2022-11-26 LAB — BASIC METABOLIC PANEL
BUN/Creatinine Ratio: 18 (ref 9–23)
BUN: 15 mg/dL (ref 6–24)
CO2: 25 mmol/L (ref 20–29)
Calcium: 9.7 mg/dL (ref 8.7–10.2)
Chloride: 101 mmol/L (ref 96–106)
Creatinine, Ser: 0.84 mg/dL (ref 0.57–1.00)
Glucose: 81 mg/dL (ref 70–99)
Potassium: 4 mmol/L (ref 3.5–5.2)
Sodium: 141 mmol/L (ref 134–144)
eGFR: 87 mL/min/{1.73_m2} (ref >59)

## 2022-11-26 LAB — TSH: TSH: 2.12 u[IU]/mL (ref 0.450–4.500)

## 2022-12-01 ENCOUNTER — Other Ambulatory Visit: Payer: Self-pay

## 2022-12-04 ENCOUNTER — Other Ambulatory Visit: Payer: Self-pay

## 2022-12-10 ENCOUNTER — Ambulatory Visit (INDEPENDENT_AMBULATORY_CARE_PROVIDER_SITE_OTHER): Payer: Medicaid Other | Admitting: Neurology

## 2022-12-10 DIAGNOSIS — R202 Paresthesia of skin: Secondary | ICD-10-CM

## 2022-12-10 DIAGNOSIS — E1142 Type 2 diabetes mellitus with diabetic polyneuropathy: Secondary | ICD-10-CM

## 2022-12-10 NOTE — Progress Notes (Signed)
EMG report is under procedure tab 

## 2022-12-10 NOTE — Procedures (Signed)
Full Name: Tiffany Le Gender: Female MRN #: [id number] Date of Birth: 1975-08-16    Visit Date: 12/10/2022 10:39 Age: 47 Years Examining Physician: Dr. Levert Feinstein Referring Physician: Dr. Levert Feinstein Height: 5 feet 4 inch History: 47 year old female with a long history of diabetes, mild bilateral feet paresthesia, few months history of worsening bilateral fingertips paresthesia right worse than left  Summary of the test:  Nerve conduction study: Bilateral sural sensory responses were within normal limit.  Bilateral superficial peroneal sensory responses showed mild to moderately decreased snap amplitude.  Bilateral median sensory response showed mild to moderately prolonged peak latency, with moderately decreased snap amplitude.  Bilateral ulnar sensory responses showed borderline snap amplitude  Right tibial, bilateral peroneal motor responses were normal.  Left peroneal motor response showed mildly decreased CMAP amplitude.  Bilateral ulnar motor responses were normal.  Bilateral median motor responses showed mildly prolonged distal latency, left side also has slightly decreased CMAP amplitude  Electromyography: Patient cannot tolerate any of the needle examinations  Conclusion: This is an abnormal study.  There is electrodiagnostic evidence of mild length-dependent axonal sensorimotor polyneuropathy.  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    ------------------------------- Levert Feinstein, M.D. Ph.D.  [facility] [address], [address] [place], [place] [address] Tel: [phone] Fax: [phone]  Verbal informed consent was obtained from the patient, patient was informed of potential risk of procedure, including bruising, bleeding, hematoma formation, infection, muscle weakness, muscle pain, numbness, among others.        MNC    Nerve / Sites  Muscle Latency Ref. Amplitude Ref. Rel Amp Segments Distance Velocity Ref. Area    ms ms mV mV %  cm m/s m/s mVms  R Median - APB     Wrist APB 4.6 ?4.4 5.9 ?4.0 100 Wrist - APB 7   20.3     Upper arm APB 10.2  4.7  79.4 Upper arm - Wrist 26.6 48 ?49 14.7  L Median - APB     Wrist APB 4.4 ?4.4 3.4 ?4.0 100 Wrist - APB 7   13.9     Upper arm APB 8.8  3.2  92 Upper arm - Wrist 23 52 ?49 14.0  R Ulnar - ADM     Wrist ADM 3.2 ?3.3 7.8 ?6.0 100 Wrist - ADM 7   23.9     B.Elbow ADM 5.3  6.2  79.1 B.Elbow - Wrist 11 53 ?49 18.1     A.Elbow ADM 8.1  6.8  110 A.Elbow - B.Elbow 19 68 ?49 23.4  L Ulnar - ADM     Wrist ADM 3.0 ?3.3 7.3 ?6.0 100 Wrist - ADM 7   22.2     B.Elbow ADM 5.1  6.9  94.2 B.Elbow - Wrist 13 61 ?49 20.9     A.Elbow ADM 8.1  6.0  86.1 A.Elbow - B.Elbow 16 54 ?49 19.0  R Peroneal - EDB     Ankle EDB 5.4 ?6.5 7.8 ?2.0 100 Ankle - EDB 9   19.3     Fib head EDB 11.3  6.9  87.9 Fib head - Ankle 27 45 ?44 16.5     Pop fossa EDB 13.2  6.6  96.4 Pop fossa - Fib head 10.6 57 ?44 16.9         Pop fossa - Ankle      L Peroneal - EDB  Ankle EDB 4.8 ?6.5 4.8 ?2.0 100 Ankle - EDB 9   14.6     Fib head EDB 10.0  8.1  167 Fib head - Ankle 23 45 ?44 26.8     Pop fossa EDB 12.4  8.4  104 Pop fossa - Fib head 11 46 ?44 30.2         Pop fossa - Ankle      R Tibial - AH     Ankle AH 4.5 ?5.8 4.3 ?4.0 100 Ankle - AH 9   10.7     Pop fossa AH 13.8  3.6  83.3 Pop fossa - Ankle 41 44 ?41 12.8  L Tibial - AH     Ankle AH 4.8 ?5.8 2.8 ?4.0 100 Ankle - AH 9   8.9     Pop fossa AH 13.5  1.2  42.8 Pop fossa - Ankle 42 49 ?41 3.3                     SNC    Nerve / Sites Rec. Site Peak Lat Ref.  Amp Ref. Segments Distance    ms ms V V  cm  R Sural - Ankle (Calf)     Calf Ankle 2.6 ?4.4 7 ?6 Calf - Ankle 14  L Sural - Ankle (Calf)     Calf Ankle 2.8 ?4.4 6 ?6 Calf - Ankle 14  R Superficial peroneal - Ankle     Lat leg Ankle 3.0 ?4.4 3 ?6 Lat leg - Ankle 14  L Superficial peroneal - Ankle      Lat leg Ankle 4.3 ?4.4 2 ?6 Lat leg - Ankle 14  R Median - Orthodromic (Dig II, Mid palm)     Dig II Wrist 4.4 ?3.4 3 ?10 Dig II - Wrist 13  L Median - Orthodromic (Dig II, Mid palm)     Dig II Wrist 3.8 ?3.4 3 ?10 Dig II - Wrist 13  R Ulnar - Orthodromic, (Dig V, Mid palm)     Dig V Wrist 2.8 ?3.1 5 ?5 Dig V - Wrist 11  L Ulnar - Orthodromic, (Dig V, Mid palm)     Dig V Wrist 2.7 ?3.1 4 ?5 Dig V - Wrist 80                     F  Wave    Nerve F Lat Ref.   ms ms  R Tibial - AH 57.3 ?56.0  L Tibial - AH 59.2 ?56.0  R Ulnar - ADM 25.8 ?32.0  L Ulnar - ADM 26.0 ?32.0

## 2022-12-23 ENCOUNTER — Other Ambulatory Visit: Payer: Self-pay | Admitting: Internal Medicine

## 2022-12-25 ENCOUNTER — Ambulatory Visit (HOSPITAL_COMMUNITY): Payer: Medicaid Other

## 2022-12-31 ENCOUNTER — Other Ambulatory Visit: Payer: Self-pay | Admitting: Internal Medicine

## 2023-01-03 ENCOUNTER — Other Ambulatory Visit: Payer: Self-pay | Admitting: Internal Medicine

## 2023-01-12 ENCOUNTER — Other Ambulatory Visit: Payer: Self-pay | Admitting: Internal Medicine

## 2023-01-13 ENCOUNTER — Telehealth: Payer: Self-pay | Admitting: Internal Medicine

## 2023-01-13 ENCOUNTER — Other Ambulatory Visit: Payer: Self-pay

## 2023-01-13 MED ORDER — ROSUVASTATIN CALCIUM 20 MG PO TABS: 40.0000 mg | ORAL_TABLET | Freq: Every day | ORAL | 3 refills | Status: DC

## 2023-01-13 NOTE — Telephone Encounter (Signed)
Pt returning call

## 2023-01-13 NOTE — Telephone Encounter (Signed)
The patient is requesting to get a prescription for rosuvastatin 20 mg tablets for her to take 2 tablets (40 mg total) daily. Advised patient that I would send a new prescription in for her.

## 2023-01-13 NOTE — Telephone Encounter (Signed)
Left message for the patient to call the clinic.

## 2023-01-13 NOTE — Telephone Encounter (Signed)
Pt c/o medication issue:  1. Name of Medication:    2. How are you currently taking this medication (dosage and times per day)?   3. Are you having a reaction (difficulty breathing--STAT)?   4. What is your medication issue?   Patient states her medication was switched from 20 MG to 40 MG tablets. She states the 40 MG is too large to swallow and she would like to know if she can be switched to taking 2 20 MG tablets instead. She states she has the 40 MG bottle but she is unable to take them so she is out of medication. Please advise.

## 2023-01-21 ENCOUNTER — Ambulatory Visit (HOSPITAL_COMMUNITY): Payer: Medicaid Other | Attending: Cardiology

## 2023-01-21 DIAGNOSIS — E1142 Type 2 diabetes mellitus with diabetic polyneuropathy: Secondary | ICD-10-CM | POA: Diagnosis present

## 2023-01-21 DIAGNOSIS — R0609 Other forms of dyspnea: Secondary | ICD-10-CM

## 2023-01-21 DIAGNOSIS — I5043 Acute on chronic combined systolic (congestive) and diastolic (congestive) heart failure: Secondary | ICD-10-CM | POA: Diagnosis present

## 2023-01-21 DIAGNOSIS — I25110 Atherosclerotic heart disease of native coronary artery with unstable angina pectoris: Secondary | ICD-10-CM | POA: Diagnosis not present

## 2023-01-21 DIAGNOSIS — I63133 Cerebral infarction due to embolism of bilateral carotid arteries: Secondary | ICD-10-CM | POA: Insufficient documentation

## 2023-01-21 DIAGNOSIS — E7800 Pure hypercholesterolemia, unspecified: Secondary | ICD-10-CM | POA: Insufficient documentation

## 2023-01-21 LAB — ECHOCARDIOGRAM COMPLETE
Area-P 1/2: 3.71 cm2
S' Lateral: 2.4 cm

## 2023-01-28 ENCOUNTER — Ambulatory Visit: Payer: Medicaid Other | Admitting: Nurse Practitioner

## 2023-02-02 ENCOUNTER — Ambulatory Visit: Payer: Medicaid Other | Admitting: Nurse Practitioner

## 2023-02-03 ENCOUNTER — Ambulatory Visit: Payer: Medicaid Other | Admitting: Nurse Practitioner

## 2023-02-06 ENCOUNTER — Other Ambulatory Visit: Payer: Self-pay

## 2023-02-06 DIAGNOSIS — I10 Essential (primary) hypertension: Secondary | ICD-10-CM

## 2023-02-06 MED ORDER — TORSEMIDE 20 MG PO TABS
ORAL_TABLET | ORAL | 3 refills | Status: DC
Start: 2023-02-06 — End: 2023-03-25

## 2023-02-11 ENCOUNTER — Other Ambulatory Visit (HOSPITAL_COMMUNITY): Payer: Self-pay

## 2023-02-11 ENCOUNTER — Other Ambulatory Visit: Payer: Self-pay

## 2023-02-11 ENCOUNTER — Other Ambulatory Visit: Payer: Self-pay | Admitting: Nurse Practitioner

## 2023-02-11 DIAGNOSIS — E1169 Type 2 diabetes mellitus with other specified complication: Secondary | ICD-10-CM

## 2023-02-11 MED ORDER — GLIPIZIDE 5 MG PO TABS
5.0000 mg | ORAL_TABLET | Freq: Two times a day (BID) | ORAL | 0 refills | Status: DC
Start: 2023-02-11 — End: 2023-03-25
  Filled 2023-02-11 – 2023-02-16 (×2): qty 180, 90d supply, fill #0

## 2023-02-16 ENCOUNTER — Other Ambulatory Visit: Payer: Self-pay

## 2023-02-16 ENCOUNTER — Other Ambulatory Visit (HOSPITAL_COMMUNITY): Payer: Self-pay

## 2023-02-19 ENCOUNTER — Telehealth: Payer: Self-pay | Admitting: Internal Medicine

## 2023-02-19 ENCOUNTER — Other Ambulatory Visit: Payer: Self-pay

## 2023-02-19 NOTE — Telephone Encounter (Signed)
Pt c/o medication issue:  1. Name of Medication:   Semaglutide, 2 MG/DOSE, (OZEMPIC, 2 MG/DOSE,) 8 MG/3ML SOPN    2. How are you currently taking this medication (dosage and times per day)? Inject 2 mg into the skin once a week.   3. Are you having a reaction (difficulty breathing--STAT)? No  4. What is your medication issue? Patient is calling because she needs Pre Auth for the medication. Patient stated that she will take her last dosage tonight. The patient had a question about the dosage amount as well. Please advise.

## 2023-02-20 ENCOUNTER — Other Ambulatory Visit (HOSPITAL_COMMUNITY): Payer: Self-pay

## 2023-02-20 MED ORDER — OZEMPIC (2 MG/DOSE) 8 MG/3ML ~~LOC~~ SOPN
2.0000 mg | PEN_INJECTOR | SUBCUTANEOUS | 11 refills | Status: DC
  Filled 2023-02-20: qty 3, 28d supply, fill #0

## 2023-02-20 NOTE — Telephone Encounter (Signed)
Spoke with pt, she wanted to know if 2mg  was the highest dose, advised her it was. Still feeling really hungry on med. Her plan doesn't cover Mounjaro unfortunately. Will continue at highest Ozempic dose. Refill sent in. Advised pt would forward request for prior auth to pharm tech team to assist.

## 2023-02-24 ENCOUNTER — Ambulatory Visit: Payer: Medicaid Other | Admitting: Nurse Practitioner

## 2023-02-24 ENCOUNTER — Other Ambulatory Visit (HOSPITAL_COMMUNITY): Payer: Self-pay

## 2023-02-24 ENCOUNTER — Telehealth: Payer: Self-pay | Admitting: Pharmacy Technician

## 2023-02-24 MED ORDER — OZEMPIC (2 MG/DOSE) 8 MG/3ML ~~LOC~~ SOPN
2.0000 mg | PEN_INJECTOR | SUBCUTANEOUS | 11 refills | Status: DC
  Filled 2023-02-24 – 2023-03-02 (×2): qty 3, 28d supply, fill #0
  Filled 2023-04-06: qty 3, 28d supply, fill #1
  Filled 2023-06-08 – 2023-06-19 (×2): qty 3, 28d supply, fill #2
  Filled 2023-07-22: qty 3, 28d supply, fill #0
  Filled 2023-07-22: qty 3, 28d supply, fill #3
  Filled 2023-09-01: qty 3, 28d supply, fill #1

## 2023-02-24 NOTE — Addendum Note (Signed)
Addended by: Lundyn Coste E on: 02/24/2023 08:59 AM   Modules accepted: Orders

## 2023-02-24 NOTE — Telephone Encounter (Signed)
Refill sent in

## 2023-02-24 NOTE — Telephone Encounter (Signed)
Pharmacy Patient Advocate Encounter  Received notification from [facility] Medicaid that Prior Authorization for {Ozempic (2mg /Dose) has been APPROVED from 7.2.24 to 7.16.25.Marland Kitchen  PA #/Case ID/Reference #: [id number]

## 2023-02-24 NOTE — Telephone Encounter (Signed)
Pharmacy Patient Advocate Encounter   Received notification from Pt Calls Messages that prior authorization for Ozempic (2 MG/DOSE) 8MG /3ML pen-injectors is required/requested.   Insurance verification completed.   The patient is insured through [facility] [place] .   Per test claim: PA submitted to [facility] Medicaid via CoverMyMeds Key/confirmation #/EOC [id number] Status is pending

## 2023-02-24 NOTE — Telephone Encounter (Signed)
PA request has been Submitted. New Encounter created for follow up. For additional info see Pharmacy Prior Auth telephone encounter from 02/24/2023.

## 2023-03-02 ENCOUNTER — Other Ambulatory Visit: Payer: Self-pay

## 2023-03-02 ENCOUNTER — Other Ambulatory Visit (HOSPITAL_COMMUNITY): Payer: Self-pay

## 2023-03-03 ENCOUNTER — Ambulatory Visit: Payer: Medicaid Other | Admitting: Nurse Practitioner

## 2023-03-03 ENCOUNTER — Other Ambulatory Visit: Payer: Self-pay

## 2023-03-03 ENCOUNTER — Other Ambulatory Visit (HOSPITAL_COMMUNITY): Payer: Self-pay

## 2023-03-10 ENCOUNTER — Other Ambulatory Visit: Payer: Self-pay

## 2023-03-25 ENCOUNTER — Ambulatory Visit (INDEPENDENT_AMBULATORY_CARE_PROVIDER_SITE_OTHER): Payer: Medicaid Other | Admitting: Nurse Practitioner

## 2023-03-25 ENCOUNTER — Other Ambulatory Visit (HOSPITAL_COMMUNITY): Payer: Self-pay

## 2023-03-25 ENCOUNTER — Encounter: Payer: Self-pay | Admitting: Nurse Practitioner

## 2023-03-25 VITALS — BP 126/63 | HR 79 | Temp 97.2°F | Wt 254.6 lb

## 2023-03-25 DIAGNOSIS — Z6841 Body Mass Index (BMI) 40.0 and over, adult: Secondary | ICD-10-CM

## 2023-03-25 DIAGNOSIS — E1169 Type 2 diabetes mellitus with other specified complication: Secondary | ICD-10-CM

## 2023-03-25 DIAGNOSIS — Z1231 Encounter for screening mammogram for malignant neoplasm of breast: Secondary | ICD-10-CM

## 2023-03-25 DIAGNOSIS — R110 Nausea: Secondary | ICD-10-CM

## 2023-03-25 DIAGNOSIS — G4700 Insomnia, unspecified: Secondary | ICD-10-CM

## 2023-03-25 DIAGNOSIS — I25110 Atherosclerotic heart disease of native coronary artery with unstable angina pectoris: Secondary | ICD-10-CM | POA: Diagnosis not present

## 2023-03-25 DIAGNOSIS — E782 Mixed hyperlipidemia: Secondary | ICD-10-CM

## 2023-03-25 DIAGNOSIS — I10 Essential (primary) hypertension: Secondary | ICD-10-CM

## 2023-03-25 LAB — POCT GLYCOSYLATED HEMOGLOBIN (HGB A1C): Hemoglobin A1C: 7.1 % — AB (ref 4.0–5.6)

## 2023-03-25 MED ORDER — GLIPIZIDE 5 MG PO TABS
5.0000 mg | ORAL_TABLET | Freq: Two times a day (BID) | ORAL | 1 refills | Status: DC
Start: 2023-03-25 — End: 2023-07-30
  Filled 2023-03-25: qty 180, 90d supply, fill #0

## 2023-03-25 MED ORDER — TORSEMIDE 20 MG PO TABS
ORAL_TABLET | ORAL | 3 refills | Status: DC
  Filled 2023-03-25: qty 60, 30d supply, fill #0
  Filled 2023-06-08 – 2023-06-11 (×2): qty 60, 30d supply, fill #1

## 2023-03-25 MED ORDER — GLIPIZIDE 5 MG PO TABS
5.0000 mg | ORAL_TABLET | Freq: Two times a day (BID) | ORAL | 0 refills | Status: DC
Start: 2023-03-25 — End: 2023-03-25
  Filled 2023-03-25: qty 180, 90d supply, fill #0

## 2023-03-25 MED ORDER — ONDANSETRON HCL 4 MG PO TABS
4.0000 mg | ORAL_TABLET | Freq: Three times a day (TID) | ORAL | 0 refills | Status: DC | PRN
  Filled 2023-03-25: qty 20, 7d supply, fill #0

## 2023-03-25 MED ORDER — LOSARTAN POTASSIUM 50 MG PO TABS
50.0000 mg | ORAL_TABLET | Freq: Every day | ORAL | 2 refills | Status: DC
  Filled 2023-03-25: qty 90, 90d supply, fill #0

## 2023-03-25 MED ORDER — TRAZODONE HCL 50 MG PO TABS
25.0000 mg | ORAL_TABLET | Freq: Every evening | ORAL | 3 refills | Status: DC | PRN
  Filled 2023-03-25: qty 30, 30d supply, fill #0

## 2023-03-25 NOTE — Progress Notes (Signed)
Acute Office Visit  Subjective:     Patient ID: Tiffany Le, female    DOB: 1975/10/12, 47 y.o.   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 Patient is in today for follow up for her chronic medical conditions.   Type 2 diabetes.  Currently on Farxiga 10 mg daily, glipizide 5 mg twice daily, Ozempic 2 mg once weekly injection.  On Crestor 40 mg daily, Zetia 10 mg daily for hyperlipidemia. she does not check her blood sugar because her meter is broken, states that she was doing well with her diet at 1 time but not currently, does  walking exercises 50 minutes 3 times a week.  States that she can tell when her blood sugar is low.    Patient complains of blurry vision.  Stated that she woke up 1 morning with decreased left peripheral vision, stated that it is too few weeks for her vision to return back to baseline.  She is due for diabetic eye exam.   Due for mammogram mammogram ordered.  Plans for Pap smear at next visit.      Review of Systems  Constitutional:  Positive for fatigue. Negative for activity change, appetite change, chills and fever.  HENT:  Negative for congestion, dental problem, ear discharge, ear pain, hearing loss, rhinorrhea, sinus pressure, sinus pain, sneezing and sore throat.   Eyes:  Positive for visual disturbance. Negative for pain, discharge, redness and itching.  Respiratory:  Negative for cough, chest tightness, shortness of breath and  wheezing.   Cardiovascular:  Negative for chest pain, palpitations and leg swelling.  Gastrointestinal:  Negative for abdominal distention, abdominal pain, anal bleeding, blood in stool, constipation, diarrhea, nausea, rectal pain and vomiting.  Endocrine: Negative for cold intolerance, heat intolerance, polydipsia, polyphagia and polyuria.  Genitourinary:  Negative for difficulty urinating, dysuria, flank pain, frequency, hematuria, menstrual problem, pelvic pain and vaginal bleeding.  Musculoskeletal:  Negative for arthralgias, back pain, gait problem, joint swelling and myalgias.  Skin:  Negative for color change, pallor, rash and wound.  Allergic/Immunologic: Negative for environmental allergies, food allergies and immunocompromised state.  Neurological:  Negative for dizziness, tremors, facial asymmetry, weakness and headaches.  Hematological:  Negative for adenopathy. Does not bruise/bleed easily.  Psychiatric/Behavioral:  Negative for agitation, behavioral problems, confusion, decreased concentration, hallucinations, self-injury and suicidal ideas.         Objective:    BP 126/63   Pulse 79   Temp (!) 97.2 F (36.2 C)   Wt 254 lb 9.6 oz (115.5 kg)   SpO2 100%   BMI 43.70 kg/m    Physical Exam Vitals and nursing note reviewed.  Constitutional:      General: She is not in acute distress.    Appearance: Normal appearance. She is obese. She is not ill-appearing, toxic-appearing or diaphoretic.  HENT:     Mouth/Throat:     Mouth: Mucous membranes are moist.     Pharynx: Oropharynx is clear. No oropharyngeal exudate or posterior oropharyngeal erythema.  Eyes:     General: No scleral icterus.  Right eye: No discharge.        Left eye: No discharge.     Extraocular Movements: Extraocular movements intact.     Conjunctiva/sclera: Conjunctivae normal.  Cardiovascular:     Rate and Rhythm: Normal rate and regular rhythm.     Pulses: Normal pulses.     Heart sounds:  Normal heart sounds. No murmur heard.    No friction rub. No gallop.  Pulmonary:     Effort: Pulmonary effort is normal. No respiratory distress.     Breath sounds: Normal breath sounds. No stridor. No wheezing, rhonchi or rales.  Chest:     Chest wall: No tenderness.  Abdominal:     General: There is no distension.     Palpations: Abdomen is soft.     Tenderness: There is no abdominal tenderness. There is no right CVA tenderness, left CVA tenderness or guarding.  Musculoskeletal:        General: No swelling, tenderness, deformity or signs of injury.     Right lower leg: No edema.     Left lower leg: No edema.  Skin:    General: Skin is warm and dry.     Capillary Refill: Capillary refill takes less than 2 seconds.     Coloration: Skin is not jaundiced or pale.     Findings: No bruising, erythema or lesion.  Neurological:     Mental Status: She is alert and oriented to person, place, and time.     Motor: No weakness.     Coordination: Coordination normal.     Gait: Gait normal.  Psychiatric:        Mood and Affect: Mood normal.        Behavior: Behavior normal.        Thought Content: Thought content normal.        Judgment: Judgment normal.     Results for orders placed or performed in visit on 03/25/23  POCT glycosylated hemoglobin (Hb A1C)  Result Value Ref Range   Hemoglobin A1C 7.1 (A) 4.0 - 5.6 %   HbA1c POC (<> result, manual entry)     HbA1c, POC (prediabetic range)     HbA1c, POC (controlled diabetic range)          Assessment & Plan:   Problem List Items Addressed This Visit       Cardiovascular and Mediastinum   Essential hypertension, benign (Chronic)    BP Readings from Last 3 Encounters:  03/25/23 126/63  12/10/22 (!) 147/87  11/25/22 132/84   HTN Controlled .  On torsemide 20 mg twice daily, losartan 50 mg daily Continue current medications. No changes in management. Discussed DASH diet and dietary sodium restrictions Continue to increase  dietary efforts and exercise.         Relevant Medications   losartan (COZAAR) 50 MG tablet   torsemide (DEMADEX) 20 MG tablet   Coronary artery disease involving native coronary artery of native heart with unstable angina pectoris (HCC)    On nitroglycerin 0.4 mg as needed, torsemide 20 mg twice daily Crestor 40 mg daily, Zetia 10 mg daily aspirin 81 mg daily Continue current medication DASH diet advised engage in regular moderate exercises at least 150 minutes weekly as tolerated      Relevant Medications   losartan (COZAAR) 50 MG tablet   torsemide (DEMADEX) 20 MG tablet     Endocrine   Diabetes mellitus (HCC) - Primary    Lab Results  Component Value Date  HGBA1C 7.1 (A) 03/25/2023  Continue Ozempic 2 mg once weekly, Farxiga 10 mg daily, glipizide 5 mg twice daily Patient counseled on low-carb modified diet CBG goals discussed glucose meter ordered today Patient referred for diabetic eye exam Takes  Zofran 4 mg as needed for nausea Follow-up in 3 months      Relevant Medications   losartan (COZAAR) 50 MG tablet   glipiZIDE (GLUCOTROL) 5 MG tablet   Other Relevant Orders   Microalbumin/Creatinine Ratio, Urine   POCT glycosylated hemoglobin (Hb A1C) (Completed)   CMP14+EGFR   Ambulatory referral to Ophthalmology     Other   Class 3 severe obesity due to excess calories with serious comorbidity and body mass index (BMI) of 40.0 to 44.9 in adult (HCC)    Wt Readings from Last 3 Encounters:  03/25/23 254 lb 9.6 oz (115.5 kg)  12/10/22 251 lb (113.9 kg)  11/25/22 250 lb 9.6 oz (113.7 kg)   Body mass index is 43.7 kg/m.  Patient counseled on low-carb modified diet Encouraged to engage in regular moderate exercises at least 150 minutes weekly as tolerated      Relevant Medications   glipiZIDE (GLUCOTROL) 5 MG tablet   Hyperlipidemia    Lab Results  Component Value Date   CHOL 121 08/14/2022   HDL 38 (L) 08/14/2022   LDLCALC 70 08/14/2022   TRIG 59 08/14/2022    CHOLHDL 3.2 08/14/2022  Continue Crestor 40 mg daily, Zetia 10 mg daily Fasting lipid panel at next visit      Relevant Medications   losartan (COZAAR) 50 MG tablet   torsemide (DEMADEX) 20 MG tablet   Other Visit Diagnoses     Screening mammogram for breast cancer       Relevant Orders   MM Digital Screening   Nausea       Relevant Medications   ondansetron (ZOFRAN) 4 MG tablet   Insomnia, unspecified type       Relevant Medications   traZODone (DESYREL) 50 MG tablet       Meds ordered this encounter  Medications   DISCONTD: glipiZIDE (GLUCOTROL) 5 MG tablet    Sig: Take 1 tablet (5 mg total) by mouth 2 (two) times daily before a meal.    Dispense:  180 tablet    Refill:  0   losartan (COZAAR) 50 MG tablet    Sig: Take 1 tablet (50 mg total) by mouth at bedtime.    Dispense:  90 tablet    Refill:  2   ondansetron (ZOFRAN) 4 MG tablet    Sig: Take 1 tablet (4 mg total) by mouth every 8 (eight) hours as needed for nausea or vomiting.    Dispense:  20 tablet    Refill:  0   glipiZIDE (GLUCOTROL) 5 MG tablet    Sig: Take 1 tablet (5 mg total) by mouth 2 (two) times daily before a meal.    Dispense:  180 tablet    Refill:  1   torsemide (DEMADEX) 20 MG tablet    Sig: TAKE 1 TABLET(20 MG) BY MOUTH TWICE DAILY    Dispense:  60 tablet    Refill:  3   traZODone (DESYREL) 50 MG tablet    Sig: Take 0.5-1 tablets (25-50 mg total) by mouth at bedtime as needed for sleep.    Dispense:  30 tablet    Refill:  3    Return in about 3 months (around 06/25/2023) for CPE PAP.  Donell Beers, FNP

## 2023-03-25 NOTE — Assessment & Plan Note (Addendum)
Lab Results  Component Value Date   HGBA1C 7.1 (A) 03/25/2023  Continue Ozempic 2 mg once weekly, Farxiga 10 mg daily, glipizide 5 mg twice daily Patient counseled on low-carb modified diet CBG goals discussed glucose meter ordered today Patient referred for diabetic eye exam Takes  Zofran 4 mg as needed for nausea Follow-up in 3 months

## 2023-03-25 NOTE — Assessment & Plan Note (Signed)
Lab Results  Component Value Date   CHOL 121 08/14/2022   HDL 38 (L) 08/14/2022   LDLCALC 70 08/14/2022   TRIG 59 08/14/2022   CHOLHDL 3.2 08/14/2022  Continue Crestor 40 mg daily, Zetia 10 mg daily Fasting lipid panel at next visit

## 2023-03-25 NOTE — Assessment & Plan Note (Signed)
Wt Readings from Last 3 Encounters:  03/25/23 254 lb 9.6 oz (115.5 kg)  12/10/22 251 lb (113.9 kg)  11/25/22 250 lb 9.6 oz (113.7 kg)   Body mass index is 43.7 kg/m.  Patient counseled on low-carb modified diet Encouraged to engage in regular moderate exercises at least 150 minutes weekly as tolerated

## 2023-03-25 NOTE — Assessment & Plan Note (Signed)
BP Readings from Last 3 Encounters:  03/25/23 126/63  12/10/22 (!) 147/87  11/25/22 132/84   HTN Controlled .  On torsemide 20 mg twice daily, losartan 50 mg daily Continue current medications. No changes in management. Discussed DASH diet and dietary sodium restrictions Continue to increase dietary efforts and exercise.

## 2023-03-25 NOTE — Patient Instructions (Signed)

## 2023-03-25 NOTE — Assessment & Plan Note (Signed)
On nitroglycerin 0.4 mg as needed, torsemide 20 mg twice daily Crestor 40 mg daily, Zetia 10 mg daily aspirin 81 mg daily Continue current medication DASH diet advised engage in regular moderate exercises at least 150 minutes weekly as tolerated

## 2023-03-26 ENCOUNTER — Other Ambulatory Visit (HOSPITAL_COMMUNITY): Payer: Self-pay

## 2023-03-26 ENCOUNTER — Other Ambulatory Visit: Payer: Self-pay

## 2023-03-26 LAB — CMP14+EGFR
ALT: 18 IU/L (ref 0–32)
AST: 17 IU/L (ref 0–40)
Albumin: 4.3 g/dL (ref 3.9–4.9)
Alkaline Phosphatase: 75 IU/L (ref 44–121)
BUN/Creatinine Ratio: 16 (ref 9–23)
BUN: 13 mg/dL (ref 6–24)
Bilirubin Total: 0.3 mg/dL (ref 0.0–1.2)
CO2: 27 mmol/L (ref 20–29)
Calcium: 8.8 mg/dL (ref 8.7–10.2)
Chloride: 100 mmol/L (ref 96–106)
Creatinine, Ser: 0.81 mg/dL (ref 0.57–1.00)
Globulin, Total: 3.3 g/dL (ref 1.5–4.5)
Glucose: 93 mg/dL (ref 70–99)
Potassium: 4.1 mmol/L (ref 3.5–5.2)
Sodium: 140 mmol/L (ref 134–144)
Total Protein: 7.6 g/dL (ref 6.0–8.5)
eGFR: 91 mL/min/{1.73_m2} (ref >59)

## 2023-03-26 LAB — MICROALBUMIN / CREATININE URINE RATIO
Creatinine, Urine: 42.4 mg/dL
Microalb/Creat Ratio: 59 mg/g{creat} — ABNORMAL HIGH (ref 0–29)
Microalbumin, Urine: 24.9 ug/mL

## 2023-03-26 MED ORDER — BLOOD GLUCOSE MONITOR SYSTEM W/DEVICE KIT
1.0000 | PACK | Freq: Three times a day (TID) | 0 refills | Status: AC
  Filled 2023-03-26: qty 1, 1d supply, fill #0

## 2023-03-26 MED ORDER — BLOOD GLUCOSE TEST VI STRP
1.0000 | ORAL_STRIP | Freq: Three times a day (TID) | 0 refills | Status: AC
  Filled 2023-03-26: qty 100, 34d supply, fill #0

## 2023-03-26 MED ORDER — ACCU-CHEK SOFTCLIX LANCETS MISC
1.0000 | Freq: Three times a day (TID) | 0 refills | Status: AC
  Filled 2023-03-26: qty 100, 33d supply, fill #0

## 2023-03-26 MED ORDER — LANCET DEVICE MISC
1.0000 | Freq: Three times a day (TID) | 0 refills | Status: AC
  Filled 2023-03-26: qty 1, 30d supply, fill #0

## 2023-03-30 ENCOUNTER — Other Ambulatory Visit (HOSPITAL_COMMUNITY): Payer: Self-pay

## 2023-04-06 ENCOUNTER — Other Ambulatory Visit: Payer: Self-pay

## 2023-04-14 ENCOUNTER — Other Ambulatory Visit: Payer: Self-pay

## 2023-05-12 ENCOUNTER — Telehealth: Payer: Self-pay | Admitting: Pharmacy Technician

## 2023-05-12 ENCOUNTER — Other Ambulatory Visit (HOSPITAL_COMMUNITY): Payer: Self-pay

## 2023-05-12 NOTE — Telephone Encounter (Signed)
Pharmacy Patient Advocate Encounter   Received notification from Fax that prior authorization for Tiffany Le is required/requested.   Insurance verification completed.   The patient is insured through [facility] [place] .   Per test claim: PA required; PA submitted to [facility] Medicaid via CoverMyMeds Key/confirmation #/EOC [id number] Status is pending

## 2023-05-12 NOTE — Telephone Encounter (Signed)
Pharmacy Patient Advocate Encounter   Received notification from Fax that prior authorization for [name] is required/requested.   Insurance verification completed.   The patient is insured through [facility] [place] .   Per test claim: APPROVED from 05/12/23 to 05/11/24. Ran test claim, Copay is $4.00 for 90 day supply. This test claim was processed through [facility]- copay amounts may vary at other pharmacies due to pharmacy/plan contracts, or as the patient moves through the different stages of their insurance plan.   PA- [id number]

## 2023-05-20 ENCOUNTER — Other Ambulatory Visit: Payer: Self-pay

## 2023-06-01 NOTE — Progress Notes (Deleted)
Cardiology Office Note:   Date:  06/01/2023  ID:  SHASHA REHL, DOB 05-Dec-1975, MRN [id number] PCP:  Ivonne Andrew, NP  [facility] Providers Cardiologist:  Alverda Skeans, MD Referring MD: Ivonne Andrew, NP  Chief Complaint/Reason for Referral: Cardiology follow-up ASSESSMENT:    1. Hx of CABG   2. Type 2 diabetes mellitus with complication, with long-term current use of insulin (HCC)   3. Hypertension associated with diabetes (HCC)   4. Hyperlipidemia associated with type 2 diabetes mellitus (HCC)   5. Cerebrovascular accident (CVA), unspecified mechanism (HCC)   6. Body mass index (BMI) of 40.0-44.9 in adult (HCC)   7. Diastolic dysfunction     PLAN:   In order of problems listed above: CAD status post CABG: Continue aspirin, Crestor 20 mg, and as needed nitroglycerin. Type 2 diabetes mellitus: Continue aspirin, Crestor, losartan 50 mg, Farxiga 10 mg, and Ozempic. Hypertension:*** Hyperlipidemia: Check lipid panel, LFTs, LP(a) today.  Given history of stroke the patient's LDL goal is less than 55. History of stroke: Aspirin, Crestor, and blood pressure control. Elevated BMI: Continue Ozempic. Diastolic dysfunction: Continue losartan, Farxiga, torsemide, and start spironolactone 25 mg; check BMP in 1 week.***        {Are you ordering a CV Procedure (e.g. stress test, cath, DCCV, TEE, etc)?   Press F2        :[id number]}   Dispo:  No follow-ups on file.      Medication Adjustments/Labs and Tests Ordered: Current medicines are reviewed at length with the patient today.  Concerns regarding medicines are outlined above.  The following changes have been made:  {PLAN; NO CHANGE:13088:s}   Labs/tests ordered: No orders of the defined types were placed in this encounter.   Medication Changes: No orders of the defined types were placed in this encounter.   Current medicines are reviewed at length with the patient today.  The patient {ACTIONS; HAS/DOES NOT  HAVE:19233} concerns regarding medicines.  I spent *** minutes reviewing all clinical data during and prior to this visit including all relevant imaging studies, laboratories, clinical information from other health systems, and prior notes from both Cardiology and other specialties, interviewing the patient, and conducting a complete physical examination in order to formulate a comprehensive and personalized evaluation and treatment plan.  History of Present Illness:      FOCUSED PROBLEM LIST:   Coronary artery disease status post CABG consisting of a LIMA to LAD, RIMA to PDA, and sequential left radial to OM1 to OM 2 with atrial appendage clipping March 2022 C/b sternal wound dehiscence requiring debridement  Hypertension Hyperlipidemia LDL goal less than 55 given history of stroke Type 2 diabetes on insulin Neuropathy History of stroke MRI with remote lacunar infarct of right thalamus and subacute ischemic infarctions bilateral cerebral white matter and pons Bubble study echocardiogram April 2023 negative BMI 44 Diastolic dysfunction Left ventricular hypertrophy TTE June 2024  February 2023: Sherryll Burger was stopped due to normal LV function and losartan was started due to history of diabetes.  Crestor was started and patient was referred to cardiac rehabilitation as well as for sleep study.   March 2023: The patient returns for expedited follow-up due to increasing shortness of breath.  She noticed increasing peripheral edema and says that she gained 30 pounds.  She was also constipated.  Once her constipation was resolved her peripheral edema seem to improve.  Today she feels very well.  She denies any shortness of breath, chest pain, palpitations, paroxysmal  nocturnal dyspnea, orthopnea.  In fact the only way she can sleep is absolutely flat.  She has required no emergency room visits or hospitalizations.  Given a history of unexplained stroke she was referred for a bubble study  echocardiogram, Plavix was stopped and aspirin monotherapy continued due to history of CABG required.  Due to her ejection fraction being normal and her losartan was increased to 50 mg.  October 2024: The patient was last seen in our division in April 2024.  At that point in time her blood pressure was well-controlled.  She was doing much better and had remove processed foods such as hot dogs from her diet.  She was walking much more as well.          Current Medications: No outpatient medications have been marked as taking for the 06/02/23 encounter (Appointment) with Orbie Pyo, MD.     Review of Systems:   Please see the history of present illness.    All other systems reviewed and are negative.     EKGs/Labs/Other Test Reviewed:   EKG:    EKG Interpretation Date/Time:    Ventricular Rate:    PR Interval:    QRS Duration:    QT Interval:    QTC Calculation:   R Axis:      Text Interpretation:           Risk Assessment/Calculations:   {Does this patient have ATRIAL FIBRILLATION?[phone]}      Physical Exam:   VS:  There were no vitals taken for this visit.   No BP recorded.  {Refresh Note OR Click here to enter BP  :1}***   Wt Readings from Last 3 Encounters:  03/25/23 254 lb 9.6 oz (115.5 kg)  12/10/22 251 lb (113.9 kg)  11/25/22 250 lb 9.6 oz (113.7 kg)      GENERAL:  No apparent distress, AOx3 HEENT:  No carotid bruits, +2 carotid impulses, no scleral icterus CAR: RRR Irregular RR*** no murmurs***, gallops, rubs, or thrills RES:  Clear to auscultation bilaterally ABD:  Soft, nontender, nondistended, positive bowel sounds x 4 VASC:  +2 radial pulses, +2 carotid pulses NEURO:  CN 2-12 grossly intact; motor and sensory grossly intact PSYCH:  No active depression or anxiety EXT:  No edema, ecchymosis, or cyanosis  Signed, Orbie Pyo, MD  06/01/2023 6:30 PM    [facility] [address] [place], [place], [place]   [address] Phone: [phone]; Fax: [phone]   Note:  This document was prepared using Dragon voice recognition software and may include unintentional dictation errors.

## 2023-06-02 ENCOUNTER — Ambulatory Visit: Payer: Medicaid Other | Admitting: Internal Medicine

## 2023-06-02 DIAGNOSIS — E1169 Type 2 diabetes mellitus with other specified complication: Secondary | ICD-10-CM

## 2023-06-02 DIAGNOSIS — I639 Cerebral infarction, unspecified: Secondary | ICD-10-CM

## 2023-06-02 DIAGNOSIS — Z951 Presence of aortocoronary bypass graft: Secondary | ICD-10-CM

## 2023-06-02 DIAGNOSIS — I5189 Other ill-defined heart diseases: Secondary | ICD-10-CM

## 2023-06-02 DIAGNOSIS — Z6841 Body Mass Index (BMI) 40.0 and over, adult: Secondary | ICD-10-CM

## 2023-06-02 DIAGNOSIS — Z794 Long term (current) use of insulin: Secondary | ICD-10-CM

## 2023-06-02 DIAGNOSIS — E1159 Type 2 diabetes mellitus with other circulatory complications: Secondary | ICD-10-CM

## 2023-06-08 ENCOUNTER — Other Ambulatory Visit (HOSPITAL_COMMUNITY): Payer: Self-pay

## 2023-06-12 ENCOUNTER — Other Ambulatory Visit: Payer: Self-pay

## 2023-06-16 ENCOUNTER — Other Ambulatory Visit: Payer: Self-pay

## 2023-06-18 ENCOUNTER — Other Ambulatory Visit (HOSPITAL_COMMUNITY): Payer: Self-pay

## 2023-06-19 ENCOUNTER — Other Ambulatory Visit (HOSPITAL_COMMUNITY): Payer: Self-pay

## 2023-06-25 ENCOUNTER — Ambulatory Visit: Payer: Self-pay | Admitting: Nurse Practitioner

## 2023-07-15 ENCOUNTER — Ambulatory Visit: Payer: Self-pay | Admitting: Nurse Practitioner

## 2023-07-18 NOTE — Progress Notes (Unsigned)
Cardiology Office Note:   Date:  07/18/2023  ID:  Tiffany Le, DOB [birth year], MRN [id number] PCP:  Ivonne Andrew, NP  [facility] Providers Cardiologist:  Alverda Skeans, MD Referring MD: Ivonne Andrew, NP  Chief Complaint/Reason for Referral: Cardiology follow-up ASSESSMENT:    1. Chronic diastolic heart failure (HCC)   2. S/P CABG x 4   3. Type 2 diabetes mellitus with complication, with long-term current use of insulin (HCC)   4. Hypertension associated with diabetes (HCC)   5. Hyperlipidemia associated with type 2 diabetes mellitus (HCC)   6. Body mass index (BMI) of 40.0-44.9 in adult (HCC)   7. Cerebrovascular accident (CVA), unspecified mechanism (HCC)     PLAN:   In order of problems listed above: Chronic diastolic heart failure: Continue losartan, Farxiga, and torsemide.  Start spironolactone 25 mg daily check BMP in 1 week.*** Status post CABG: Continue aspirin, rosuvastatin, and strict blood pressure control. Type 2 diabetes mellitus: Continue aspirin, Farxiga, losartan, Crestor, and Ozempic. Hypertension:*** Hyperlipidemia: Check lipid panel, LFTs, LP(a) today.  Goal LDL is less than 55. Elevated BMI: Continue Ozempic. History of stroke: Continue control cardiovascular risk factors.        {Are you ordering a CV Procedure (e.g. stress test, cath, DCCV, TEE, etc)?   Press F2        :[id number]}   Dispo:  No follow-ups on file.      Medication Adjustments/Labs and Tests Ordered: Current medicines are reviewed at length with the patient today.  Concerns regarding medicines are outlined above.  The following changes have been made:  {PLAN; NO CHANGE:13088:s}   Labs/tests ordered: No orders of the defined types were placed in this encounter.   Medication Changes: No orders of the defined types were placed in this encounter.   Current medicines are reviewed at length with the patient today.  The patient {ACTIONS; HAS/DOES NOT HAVE:19233} concerns  regarding medicines.  I spent *** minutes reviewing all clinical data during and prior to this visit including all relevant imaging studies, laboratories, clinical information from other health systems and prior notes from both Cardiology and other specialties, interviewing the patient, conducting a complete physical examination, and coordinating care in order to formulate a comprehensive and personalized evaluation and treatment plan.   History of Present Illness:      FOCUSED PROBLEM LIST:   CAD ACS > LIMA to LAD, RIMA to PDA, left radial to OM1 to OM2 +LAA clipping March 2022 Sternal wound dehiscence> debridement Hypertension Hyperlipidemia Goal LDL less than 55 > ACS Type 2 diabetes on insulin Neuropathy History of stroke MRI remote lacunar infarct right thalamus and subacute ischemic infarctions bilateral cerebral white matter and pons Bubble study TTE 2023 negative BMI 44 Diastolic dysfunction LVH TTE 2024  2/23:The patient is a 47 y.o. female with the indicated medical history here for fatigue and to establish cardiovascular care.  She saw her primary care provider recently.  She denied any chest pain or shortness of breath.  Recent lab work was unremarkable.  A TSH was drawn in October 2022 which was unremarkable.  Echocardiogram was ordered which demonstrated normal LV function with no significant valvular abnormalities and grade 2 diastolic dysfunction.     The patient tells me that she has had sharp shooting chest discomfort that lasts seconds.  This happens when she carries her work bag.  She has no other types of chest pain.  She denies any significant shortness of breath, orthopnea, paroxysmal nocturnal dyspnea,  palpitations, or signs or symptoms of recurrent stroke.  She notes that she has been tired.  She did not participate in cardiac rehabilitation postoperatively due to have to think to do intensive physical therapy because her left arm was very weak.  Her biggest issue is  with neuropathic bilateral leg pain.  Plan: Refer to cardiac rehabilitation, plan on stopping Plavix March 2023, refer to pharmacy regarding medical therapy for elevated BMI, stop Entresto given normal LV function and start losartan, changed to Crestor 20 mg and obtain sleep study.  3/23:  March 2023: The patient returns for expedited follow-up due to increasing shortness of breath.  She noticed increasing peripheral edema and says that she gained 30 pounds.  She was also constipated.  Once her constipation was resolved her peripheral edema seem to improve.  Today she feels very well.  She denies any shortness of breath, chest pain, palpitations, paroxysmal nocturnal dyspnea, orthopnea.  In fact the only way she can sleep is absolutely flat.  She has required no emergency room visits or hospitalizations.  Given a history of unexplained stroke she was referred for a bubble study echocardiogram, Plavix was stopped and aspirin monotherapy continued due to history of CABG required.  Due to her ejection fraction being normal and her losartan was in creased to 50 mg.  Plan: Stop spironolactone and increase losartan to 50 mg, stop Coreg given normal ejection fraction, stop Plavix, obtain bubble study echocardiogram given history of stroke, and refer to pharmacy for recommendations regarding elevated BMI.  12/24: The patient was seen in April by general cardiology.  At that point in time the patient endorsed shortness of breath.  Her torsemide was continued.  Echocardiogram was pursued which demonstrated normal LV function and mild concentric left ventricular hypertrophy.         Current Medications: No outpatient medications have been marked as taking for the 07/22/23 encounter (Appointment) with Orbie Pyo, MD.     Review of Systems:   Please see the history of present illness.    All other systems reviewed and are negative.     EKGs/Labs/Other Test Reviewed:   EKG:    EKG  Interpretation Date/Time:    Ventricular Rate:    PR Interval:    QRS Duration:    QT Interval:    QTC Calculation:   R Axis:      Text Interpretation:           Risk Assessment/Calculations:   {Does this patient have ATRIAL FIBRILLATION?:[phone]}      Physical Exam:   VS:  There were no vitals taken for this visit.   No BP recorded.  {Refresh Note OR Click here to enter BP  :1}***   Wt Readings from Last 3 Encounters:  03/25/23 254 lb 9.6 oz (115.5 kg)  12/10/22 251 lb (113.9 kg)  11/25/22 250 lb 9.6 oz (113.7 kg)      GENERAL:  No apparent distress, AOx3 HEENT:  No carotid bruits, +2 carotid impulses, no scleral icterus CAR: RRR Irregular RR*** no murmurs***, gallops, rubs, or thrills RES:  Clear to auscultation bilaterally ABD:  Soft, nontender, nondistended, positive bowel sounds x 4 VASC:  +2 radial pulses, +2 carotid pulses NEURO:  CN 2-12 grossly intact; motor and sensory grossly intact PSYCH:  No active depression or anxiety EXT:  No edema, ecchymosis, or cyanosis  Signed, Orbie Pyo, MD  07/18/2023 6:36 AM    [facility] [address] [place], [place],  [place]  [address] Phone: [phone]; Fax: [phone]   Note:  This document was prepared using Dragon voice recognition software and may include unintentional dictation errors.

## 2023-07-22 ENCOUNTER — Ambulatory Visit: Payer: Medicaid Other | Attending: Internal Medicine | Admitting: Internal Medicine

## 2023-07-22 ENCOUNTER — Other Ambulatory Visit: Payer: Self-pay

## 2023-07-22 ENCOUNTER — Encounter: Payer: Self-pay | Admitting: Internal Medicine

## 2023-07-22 VITALS — BP 130/85 | HR 83 | Ht 64.0 in | Wt 254.0 lb

## 2023-07-22 DIAGNOSIS — E1159 Type 2 diabetes mellitus with other circulatory complications: Secondary | ICD-10-CM | POA: Diagnosis not present

## 2023-07-22 DIAGNOSIS — Z951 Presence of aortocoronary bypass graft: Secondary | ICD-10-CM | POA: Diagnosis not present

## 2023-07-22 DIAGNOSIS — Z6841 Body Mass Index (BMI) 40.0 and over, adult: Secondary | ICD-10-CM

## 2023-07-22 DIAGNOSIS — E785 Hyperlipidemia, unspecified: Secondary | ICD-10-CM

## 2023-07-22 DIAGNOSIS — I639 Cerebral infarction, unspecified: Secondary | ICD-10-CM

## 2023-07-22 DIAGNOSIS — I5032 Chronic diastolic (congestive) heart failure: Secondary | ICD-10-CM

## 2023-07-22 DIAGNOSIS — E118 Type 2 diabetes mellitus with unspecified complications: Secondary | ICD-10-CM

## 2023-07-22 DIAGNOSIS — Z794 Long term (current) use of insulin: Secondary | ICD-10-CM

## 2023-07-22 DIAGNOSIS — I152 Hypertension secondary to endocrine disorders: Secondary | ICD-10-CM

## 2023-07-22 DIAGNOSIS — R400 Somnolence: Secondary | ICD-10-CM

## 2023-07-22 DIAGNOSIS — R5383 Other fatigue: Secondary | ICD-10-CM

## 2023-07-22 DIAGNOSIS — E1169 Type 2 diabetes mellitus with other specified complication: Secondary | ICD-10-CM

## 2023-07-22 MED ORDER — SPIRONOLACTONE 25 MG PO TABS
25.0000 mg | ORAL_TABLET | Freq: Every day | ORAL | 3 refills | Status: DC
  Filled 2023-07-22: qty 90, 90d supply, fill #0
  Filled 2023-10-25: qty 90, 90d supply, fill #1

## 2023-07-22 NOTE — Patient Instructions (Addendum)
Medication Instructions:  Your physician has recommended you make the following change in your medication:   1) START spironolactone 25 mg daily  *If you need a refill on your cardiac medications before your next appointment, please call your pharmacy*  Lab Work: Next week: fasting Lipid panel, LP(a), CBC, reflex TSH, CMP If you have labs (blood work) drawn today and your tests are completely normal, you will receive your results only by: MyChart Message (if you have MyChart) OR A paper copy in the mail If you have any lab test that is abnormal or we need to change your treatment, we will call you to review the results.  Testing/Procedures: Your physician has recommended that you have a sleep study. This test records several body functions during sleep, including: brain activity, eye movement, oxygen and carbon dioxide blood levels, heart rate and rhythm, breathing rate and rhythm, the flow of air through your mouth and nose, snoring, body muscle movements, and chest and belly movement.   Follow-Up: At [facility], you and your health needs are our priority.  As part of our continuing mission to provide you with exceptional heart care, we have created designated Provider Care Teams.  These Care Teams include your primary Cardiologist (physician) and Advanced Practice Providers (APPs -  Physician Assistants and Nurse Practitioners) who all work together to provide you with the care you need, when you need it.  Your next appointment:   6 month(s)  The format for your next appointment:   In Person  Provider:   Tereso Newcomer, PA-C   Other Instructions

## 2023-07-23 ENCOUNTER — Other Ambulatory Visit: Payer: Self-pay

## 2023-07-29 ENCOUNTER — Other Ambulatory Visit: Payer: Self-pay | Admitting: Nurse Practitioner

## 2023-07-29 DIAGNOSIS — E1169 Type 2 diabetes mellitus with other specified complication: Secondary | ICD-10-CM

## 2023-07-30 ENCOUNTER — Ambulatory Visit (INDEPENDENT_AMBULATORY_CARE_PROVIDER_SITE_OTHER): Payer: Medicaid Other | Admitting: Nurse Practitioner

## 2023-07-30 ENCOUNTER — Encounter: Payer: Self-pay | Admitting: Nurse Practitioner

## 2023-07-30 VITALS — BP 138/63 | Temp 98.2°F | Wt 258.6 lb

## 2023-07-30 DIAGNOSIS — H53132 Sudden visual loss, left eye: Secondary | ICD-10-CM | POA: Diagnosis not present

## 2023-07-30 DIAGNOSIS — R42 Dizziness and giddiness: Secondary | ICD-10-CM | POA: Diagnosis not present

## 2023-07-30 DIAGNOSIS — I10 Essential (primary) hypertension: Secondary | ICD-10-CM

## 2023-07-30 DIAGNOSIS — E1169 Type 2 diabetes mellitus with other specified complication: Secondary | ICD-10-CM | POA: Diagnosis not present

## 2023-07-30 MED ORDER — TORSEMIDE 20 MG PO TABS: ORAL_TABLET | ORAL | 3 refills | Status: DC

## 2023-07-30 MED ORDER — GLIPIZIDE 5 MG PO TABS: 5.0000 mg | ORAL_TABLET | Freq: Two times a day (BID) | ORAL | 1 refills | Status: DC

## 2023-07-30 MED ORDER — LOSARTAN POTASSIUM 50 MG PO TABS: 50.0000 mg | ORAL_TABLET | Freq: Every day | ORAL | 2 refills | Status: DC

## 2023-07-30 NOTE — Patient Instructions (Signed)
1. Type 2 diabetes mellitus with other specified complication, without long-term current use of insulin (HCC) (Primary)  - POCT glycosylated hemoglobin (Hb A1C) - glipiZIDE (GLUCOTROL) 5 MG tablet; Take 1 tablet (5 mg total) by mouth 2 (two) times daily before a meal.  Dispense: 180 tablet; Refill: 1  2. Dizziness  - Ambulatory referral to Neurology  3. Sudden loss of vision, left  - Ambulatory referral to Neurology  4. Essential hypertension, benign  - torsemide (DEMADEX) 20 MG tablet; TAKE 1 TABLET(20 MG) BY MOUTH TWICE DAILY  Dispense: 60 tablet; Refill: 3  Follow up:  Follow up in 3 month

## 2023-07-30 NOTE — Progress Notes (Signed)
Subjective   Patient ID: Tiffany Le, female    DOB: 1976/06/20, 47 y.o.   MRN: [id number]  Chief Complaint  Patient presents with   Follow-up    Patient needs blood work    Referring provider: Ivonne Andrew, NP  Tiffany Le is a 47 y.o. female with Past Medical History: No date: Coronary artery disease 09/2020: CVA (cerebral vascular accident) [facility]) No date: Diabetes mellitus without complication (HCC) 02/2020: Diabetic neuropathy (HCC) 09/2020: Hx of CABG age 3: Hyperlipidemia age 26: Hypertension 11/2019: Hypocalcemia 11/2019: Hypokalemia No date: Iron deficiency anemia 09/2020: NSTEMI (non-ST elevated myocardial infarction) (HCC) No date: Obesity 12/09/2019: Proteinuria 09/2020: Pulmonary embolism (HCC)   HPI  Type 2 diabetes.  Currently on glipizide 5 mg twice daily, Ozempic 2 mg once weekly injection.  On Crestor 40 mg daily, Zetia 10 mg daily for hyperlipidemia. she does not check her blood sugar because her meter is broken, states that she was doing well with her diet at 1 time but not currently, does  walking exercises 50 minutes 3 times a week.  States that she can tell when her blood sugar is low.  Patient complains today of dizziness.  She states that she has been having issues with her left side and she did have sudden loss of vision to her left eye this past June.  She states that almost all the vision has returned but she has not been able to go to an eye doctor because of her insurance.  She does tend to lean and sometimes try to fall to her left side.  We discussed concern for possible history of stroke and will refer her to neurology for further evaluation. Denies f/c/s, n/v/d, hemoptysis, PND, leg swelling Denies chest pain or edema     Allergies  Allergen Reactions   Oxycodone Nausea And Vomiting and Other (See Comments)    Pt states she had dizziness   Penicillins Rash    As a baby     There is no immunization history on file for  this patient.  Tobacco History: Social History   Tobacco Use  Smoking Status Never  Smokeless Tobacco Never   Counseling given: Not Answered   Outpatient Encounter Medications as of 07/30/2023  Medication Sig   albuterol (PROVENTIL HFA) 108 (90 Base) MCG/ACT inhaler Inhale 2 puffs into the lungs every 6 (six) hours as needed for wheezing.   aspirin EC 81 MG tablet Take 1 tablet (81 mg total) by mouth daily.   Blood Glucose Monitoring Suppl (BLOOD GLUCOSE MONITOR SYSTEM) w/Device KIT Test in the morning, at noon, and at bedtime.   dapagliflozin propanediol (FARXIGA) 10 MG TABS tablet TAKE 1 TABLET(10 MG) BY MOUTH DAILY   ezetimibe (ZETIA) 10 MG tablet TAKE 1 TABLET(10 MG) BY MOUTH DAILY   gabapentin (NEURONTIN) 300 MG capsule TAKE 1 CAPSULE(300 MG) BY MOUTH TWICE DAILY   Glucose Blood (BLOOD GLUCOSE TEST STRIPS) STRP Test in the morning, at noon, and at bedtime.   ibuprofen (ADVIL) 600 MG tablet Take 1 tablet (600 mg total) by mouth every 8 (eight) hours as needed.   Insulin Pen Needle (B-D UF III MINI PEN NEEDLES) 31G X 5 MM MISC Use as directed with insulin.   methocarbamol (ROBAXIN) 500 MG tablet Take 1 tablet (500 mg total) by mouth every 8 (eight) hours as needed for muscle spasms.   nitroGLYCERIN (NITROSTAT) 0.4 MG SL tablet DISSOLVE 1 TABLET UNDER THE TONGUE EVERY 5 MINUTES FOR 3 DOSES AS  NEEDED FOR CHEST PAIN   ondansetron (ZOFRAN) 4 MG tablet Take 1 tablet (4 mg total) by mouth every 8 (eight) hours as needed for nausea or vomiting.   rosuvastatin (CRESTOR) 20 MG tablet Take 2 tablets (40 mg total) by mouth daily.   Semaglutide, 2 MG/DOSE, (OZEMPIC, 2 MG/DOSE,) 8 MG/3ML SOPN Inject 2 mg into the skin once a week.   spironolactone (ALDACTONE) 25 MG tablet Take 1 tablet (25 mg total) by mouth daily.   traZODone (DESYREL) 50 MG tablet Take 0.5-1 tablets (25-50 mg total) by mouth at bedtime as needed for sleep.   [DISCONTINUED] glipiZIDE (GLUCOTROL) 5 MG tablet TAKE 1 TABLET(5 MG)  BY MOUTH TWICE DAILY BEFORE A MEAL   [DISCONTINUED] losartan (COZAAR) 50 MG tablet Take 1 tablet (50 mg total) by mouth at bedtime.   [DISCONTINUED] torsemide (DEMADEX) 20 MG tablet TAKE 1 TABLET(20 MG) BY MOUTH TWICE DAILY   glipiZIDE (GLUCOTROL) 5 MG tablet Take 1 tablet (5 mg total) by mouth 2 (two) times daily before a meal.   losartan (COZAAR) 50 MG tablet Take 1 tablet (50 mg total) by mouth at bedtime.   torsemide (DEMADEX) 20 MG tablet TAKE 1 TABLET(20 MG) BY MOUTH TWICE DAILY   No facility-administered encounter medications on file as of 07/30/2023.    Review of Systems  Review of Systems  Constitutional: Negative.   HENT: Negative.    Cardiovascular: Negative.   Gastrointestinal: Negative.   Allergic/Immunologic: Negative.   Neurological: Negative.   Psychiatric/Behavioral: Negative.       Objective:   BP 138/63   Temp 98.2 F (36.8 C)   Wt 258 lb 9.6 oz (117.3 kg)   BMI 44.39 kg/m   Wt Readings from Last 5 Encounters:  07/30/23 258 lb 9.6 oz (117.3 kg)  07/22/23 254 lb (115.2 kg)  03/25/23 254 lb 9.6 oz (115.5 kg)  12/10/22 251 lb (113.9 kg)  11/25/22 250 lb 9.6 oz (113.7 kg)     Physical Exam Vitals and nursing note reviewed.  Constitutional:      General: She is not in acute distress.    Appearance: She is well-developed.  Cardiovascular:     Rate and Rhythm: Normal rate and regular rhythm.  Pulmonary:     Effort: Pulmonary effort is normal.     Breath sounds: Normal breath sounds.  Neurological:     Mental Status: She is alert and oriented to person, place, and time.       Assessment & Plan:   Type 2 diabetes mellitus with other specified complication, without long-term current use of insulin (HCC) -     POCT glycosylated hemoglobin (Hb A1C) -     glipiZIDE; Take 1 tablet (5 mg total) by mouth 2 (two) times daily before a meal.  Dispense: 180 tablet; Refill: 1  Dizziness -     Ambulatory referral to Neurology  Sudden loss of vision,  left -     Ambulatory referral to Neurology  Essential hypertension, benign -     Torsemide; TAKE 1 TABLET(20 MG) BY MOUTH TWICE DAILY  Dispense: 60 tablet; Refill: 3  Other orders -     Losartan Potassium; Take 1 tablet (50 mg total) by mouth at bedtime.  Dispense: 90 tablet; Refill: 2     Return in about 3 months (around 10/28/2023).   Ivonne Andrew, NP 07/31/2023

## 2023-07-31 ENCOUNTER — Encounter: Payer: Self-pay | Admitting: Nurse Practitioner

## 2023-07-31 LAB — POCT GLYCOSYLATED HEMOGLOBIN (HGB A1C): Hemoglobin A1C: 6.9 % — AB (ref 4.0–5.6)

## 2023-08-13 ENCOUNTER — Encounter: Payer: Self-pay | Admitting: Neurology

## 2023-08-13 ENCOUNTER — Institutional Professional Consult (permissible substitution): Payer: Medicaid Other | Admitting: Neurology

## 2023-08-19 ENCOUNTER — Ambulatory Visit (INDEPENDENT_AMBULATORY_CARE_PROVIDER_SITE_OTHER): Payer: Medicaid Other | Admitting: Nurse Practitioner

## 2023-08-19 ENCOUNTER — Encounter: Payer: Self-pay | Admitting: Nurse Practitioner

## 2023-08-19 VITALS — BP 123/70 | HR 71 | Temp 97.1°F | Wt 260.0 lb

## 2023-08-19 DIAGNOSIS — E118 Type 2 diabetes mellitus with unspecified complications: Secondary | ICD-10-CM | POA: Diagnosis not present

## 2023-08-19 DIAGNOSIS — Z794 Long term (current) use of insulin: Secondary | ICD-10-CM | POA: Diagnosis not present

## 2023-08-19 DIAGNOSIS — E1129 Type 2 diabetes mellitus with other diabetic kidney complication: Secondary | ICD-10-CM | POA: Diagnosis not present

## 2023-08-19 DIAGNOSIS — R809 Proteinuria, unspecified: Secondary | ICD-10-CM | POA: Insufficient documentation

## 2023-08-19 NOTE — Progress Notes (Signed)
 Acute Office Visit  Subjective:     Patient ID: Tiffany Le, female    DOB: September 06, 1975, 48 y.o.   MRN: [id number]  Chief Complaint  Patient presents with   Urinary Tract Infection    Cloudy and per pt odor. 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  Patient presents with c/o abnormal urine test. She had a testing done at home by her insurance that showed protein. She denies fever, chills, abdominal pain, nausea, vomiting, dysuria, urinary frequency, hesitancy.   Patient is in today for cloudiness and ododur.   Will make eye exam today   Review of Systems  Constitutional:  Negative for activity change, appetite change, chills, fatigue and fever.  HENT:  Negative for congestion, dental problem, ear discharge, ear pain, hearing loss, rhinorrhea, sinus pressure, sinus pain, sneezing and sore throat.   Respiratory:  Negative for cough, chest tightness, shortness of breath and wheezing.   Cardiovascular:  Negative for chest pain, palpitations and leg swelling.  Gastrointestinal:  Negative for abdominal distention, abdominal pain, anal bleeding, blood in stool, constipation, diarrhea, nausea, rectal pain and vomiting.  Genitourinary:  Negative for difficulty urinating, dysuria, flank pain, frequency, hematuria, menstrual problem, pelvic pain, urgency and vaginal bleeding.  Musculoskeletal:  Negative for arthralgias, back pain, gait problem, joint swelling and myalgias.  Skin:  Negative  for color change, pallor, rash and wound.  Allergic/Immunologic: Negative for environmental allergies, food allergies and immunocompromised state.  Neurological:  Negative for dizziness, tremors, facial asymmetry, weakness and headaches.  Hematological:  Negative for adenopathy. Does not bruise/bleed easily.  Psychiatric/Behavioral:  Negative for agitation, behavioral problems, confusion, decreased concentration, hallucinations, self-injury and suicidal ideas.         Objective:    BP 123/70   Pulse 71   Temp (!) 97.1 F (36.2 C)   Wt 260 lb (117.9 kg)   SpO2 100%   BMI 44.63 kg/m    Physical Exam Vitals and nursing note reviewed.  Constitutional:      General: She is not in acute distress.    Appearance: Normal appearance. She is obese. She is not ill-appearing, toxic-appearing or diaphoretic.  HENT:     Mouth/Throat:     Mouth: Mucous membranes are moist.     Pharynx: Oropharynx is clear. No oropharyngeal exudate or posterior oropharyngeal erythema.  Eyes:     General: No scleral icterus.       Right eye: No discharge.        Left eye: No discharge.     Extraocular Movements: Extraocular movements intact.     Conjunctiva/sclera: Conjunctivae normal.  Cardiovascular:     Rate and Rhythm: Normal rate and regular rhythm.     Pulses: Normal pulses.     Heart sounds: Normal heart sounds. No murmur heard.    No friction rub. No gallop.  Pulmonary:     Effort: Pulmonary effort is normal. No respiratory distress.     Breath sounds: Normal breath sounds. No stridor. No wheezing, rhonchi or rales.  Chest:     Chest wall: No tenderness.  Abdominal:  General: There is no distension.     Palpations: Abdomen is soft.     Tenderness: There is no abdominal tenderness. There is no right CVA tenderness, left CVA tenderness or guarding.  Musculoskeletal:        General: No swelling, tenderness, deformity or signs of injury.     Right lower leg: No edema.     Left lower leg: No  edema.  Skin:    General: Skin is warm and dry.     Capillary Refill: Capillary refill takes less than 2 seconds.     Coloration: Skin is not jaundiced or pale.     Findings: No bruising, erythema or lesion.  Neurological:     Mental Status: She is alert and oriented to person, place, and time.     Motor: No weakness.     Coordination: Coordination normal.     Gait: Gait normal.  Psychiatric:        Mood and Affect: Mood normal.        Behavior: Behavior normal.        Thought Content: Thought content normal.        Judgment: Judgment normal.     No results found for any visits on 08/19/23.      Assessment & Plan:   Problem List Items Addressed This Visit       Endocrine   Type 2 diabetes mellitus with complication, with long-term current use of insulin  (HCC)   Lab Results  Component Value Date   HGBA1C 6.9 (A) 07/31/2023  Chronic medical condition currently well-controlled Continue glipizide  5 mg twice daily, Farxiga  10 mg daily, Ozempic  2 mg weekly Patient counseled on low-carb diet Encouraged to engage in regular moderate to vigorous exercise at least 150 minutes weekly         Other   Proteinuria - Primary    Treatment for proteinuria discussed with the patient Diabetes is well-controlled, blood pressure well-controlled Continue Farxiga  10 mg daily, losartan  50 mg daily These will help with kidney protection Component Ref Range & Units (hover) 4 mo ago 3 yr ago 9 yr ago  Creatinine, Urine 42.4 139.8   Microalbumin, Urine 24.9 138.7   Microalb/Creat Ratio 59 High  99 High  CM            No orders of the defined types were placed in this encounter.   No follow-ups on file.  Ellasyn Swilling R Sela Falk, FNP

## 2023-08-19 NOTE — Assessment & Plan Note (Signed)
 Lab Results  Component Value Date   HGBA1C 6.9 (A) 07/31/2023  Chronic medical condition currently well-controlled Continue glipizide  5 mg twice daily, Farxiga  10 mg daily, Ozempic  2 mg weekly Patient counseled on low-carb diet Encouraged to engage in regular moderate to vigorous exercise at least 150 minutes weekly

## 2023-08-19 NOTE — Patient Instructions (Signed)

## 2023-08-19 NOTE — Assessment & Plan Note (Addendum)
  Treatment for proteinuria discussed with the patient Diabetes is well-controlled, blood pressure well-controlled Continue Farxiga  10 mg daily, losartan  50 mg daily These will help with kidney protection Component Ref Range & Units (hover) 4 mo ago 3 yr ago 9 yr ago  Creatinine, Urine 42.4 139.8   Microalbumin, Urine 24.9 138.7   Microalb/Creat Ratio 59 High  99 High  CM

## 2023-09-01 ENCOUNTER — Other Ambulatory Visit: Payer: Self-pay | Admitting: Internal Medicine

## 2023-09-01 ENCOUNTER — Other Ambulatory Visit: Payer: Self-pay

## 2023-09-01 DIAGNOSIS — Z794 Long term (current) use of insulin: Secondary | ICD-10-CM

## 2023-09-02 ENCOUNTER — Other Ambulatory Visit: Payer: Self-pay

## 2023-09-07 ENCOUNTER — Encounter: Payer: Self-pay | Admitting: Internal Medicine

## 2023-09-07 ENCOUNTER — Other Ambulatory Visit: Payer: Self-pay | Admitting: Internal Medicine

## 2023-09-07 ENCOUNTER — Other Ambulatory Visit: Payer: Self-pay

## 2023-09-07 MED ORDER — OZEMPIC (2 MG/DOSE) 8 MG/3ML ~~LOC~~ SOPN: 2.0000 mg | PEN_INJECTOR | SUBCUTANEOUS | 5 refills | Status: DC

## 2023-09-10 ENCOUNTER — Other Ambulatory Visit: Payer: Self-pay

## 2023-10-28 ENCOUNTER — Ambulatory Visit: Payer: Self-pay | Admitting: Nurse Practitioner

## 2023-11-04 ENCOUNTER — Other Ambulatory Visit: Payer: Self-pay

## 2023-11-16 ENCOUNTER — Telehealth: Payer: Self-pay | Admitting: Internal Medicine

## 2023-11-16 MED ORDER — SPIRONOLACTONE 25 MG PO TABS: 25.0000 mg | ORAL_TABLET | Freq: Every day | ORAL | 2 refills | Status: DC

## 2023-11-16 NOTE — Telephone Encounter (Signed)
*  STAT* If patient is at the pharmacy, call can be transferred to refill team.   1. Which medications need to be refilled? (please list name of each medication and dose if known) spironolactone (ALDACTONE) 25 MG tablet   2. Which pharmacy/location (including street and city if local pharmacy) is medication to be sent to? [facility] [phone] - [place], [place] - [address]   3. Do they need a 30 day or 90 day supply? 90

## 2023-11-16 NOTE — Telephone Encounter (Signed)
 Pt's medication was sent to pt's pharmacy as requested. Confirmation received.

## 2023-11-17 ENCOUNTER — Ambulatory Visit (HOSPITAL_BASED_OUTPATIENT_CLINIC_OR_DEPARTMENT_OTHER): Attending: Internal Medicine | Admitting: Cardiovascular Disease

## 2023-11-17 ENCOUNTER — Encounter (HOSPITAL_BASED_OUTPATIENT_CLINIC_OR_DEPARTMENT_OTHER): Payer: Self-pay | Admitting: Radiology

## 2023-11-17 ENCOUNTER — Encounter (HOSPITAL_BASED_OUTPATIENT_CLINIC_OR_DEPARTMENT_OTHER): Payer: Self-pay | Admitting: *Deleted

## 2023-11-17 DIAGNOSIS — R5383 Other fatigue: Secondary | ICD-10-CM | POA: Insufficient documentation

## 2023-11-17 DIAGNOSIS — G4733 Obstructive sleep apnea (adult) (pediatric): Secondary | ICD-10-CM

## 2023-11-17 DIAGNOSIS — R400 Somnolence: Secondary | ICD-10-CM | POA: Diagnosis present

## 2023-11-18 DIAGNOSIS — H53462 Homonymous bilateral field defects, left side: Secondary | ICD-10-CM | POA: Insufficient documentation

## 2023-11-18 LAB — HM DIABETES EYE EXAM

## 2023-11-19 ENCOUNTER — Institutional Professional Consult (permissible substitution): Payer: Medicaid Other | Admitting: Neurology

## 2023-11-24 ENCOUNTER — Ambulatory Visit (INDEPENDENT_AMBULATORY_CARE_PROVIDER_SITE_OTHER): Payer: Self-pay | Admitting: Nurse Practitioner

## 2023-11-24 ENCOUNTER — Encounter: Payer: Self-pay | Admitting: Nurse Practitioner

## 2023-11-24 VITALS — BP 146/83 | HR 74 | Temp 97.9°F | Wt 264.8 lb

## 2023-11-24 DIAGNOSIS — I10 Essential (primary) hypertension: Secondary | ICD-10-CM

## 2023-11-24 DIAGNOSIS — G4700 Insomnia, unspecified: Secondary | ICD-10-CM | POA: Diagnosis not present

## 2023-11-24 DIAGNOSIS — Z794 Long term (current) use of insulin: Secondary | ICD-10-CM

## 2023-11-24 DIAGNOSIS — E1169 Type 2 diabetes mellitus with other specified complication: Secondary | ICD-10-CM

## 2023-11-24 DIAGNOSIS — E118 Type 2 diabetes mellitus with unspecified complications: Secondary | ICD-10-CM | POA: Diagnosis not present

## 2023-11-24 DIAGNOSIS — Z13220 Encounter for screening for lipoid disorders: Secondary | ICD-10-CM

## 2023-11-24 LAB — POCT GLYCOSYLATED HEMOGLOBIN (HGB A1C): Hemoglobin A1C: 7.1 % — AB (ref 4.0–5.6)

## 2023-11-24 MED ORDER — GLIPIZIDE 5 MG PO TABS: 5.0000 mg | ORAL_TABLET | Freq: Two times a day (BID) | ORAL | 1 refills | Status: DC

## 2023-11-24 MED ORDER — DAPAGLIFLOZIN PROPANEDIOL 10 MG PO TABS: 10.0000 mg | ORAL_TABLET | Freq: Every day | ORAL | 3 refills | Status: AC

## 2023-11-24 MED ORDER — ASPIRIN EC 81 MG PO TBEC: 81.0000 mg | DELAYED_RELEASE_TABLET | Freq: Every day | ORAL | 1 refills | Status: AC

## 2023-11-24 MED ORDER — OZEMPIC (2 MG/DOSE) 8 MG/3ML ~~LOC~~ SOPN
2.0000 mg | PEN_INJECTOR | SUBCUTANEOUS | 5 refills | Status: DC
Start: 2023-11-24 — End: 2024-04-26

## 2023-11-24 MED ORDER — TRAZODONE HCL 50 MG PO TABS: 25.0000 mg | ORAL_TABLET | Freq: Every evening | ORAL | 3 refills | Status: AC | PRN

## 2023-11-24 MED ORDER — LOSARTAN POTASSIUM 50 MG PO TABS: 50.0000 mg | ORAL_TABLET | Freq: Every day | ORAL | 2 refills | Status: AC

## 2023-11-24 MED ORDER — SPIRONOLACTONE 25 MG PO TABS: 25.0000 mg | ORAL_TABLET | Freq: Every day | ORAL | 2 refills | Status: AC

## 2023-11-24 MED ORDER — TORSEMIDE 20 MG PO TABS: ORAL_TABLET | ORAL | 3 refills | Status: DC

## 2023-11-24 MED ORDER — ROSUVASTATIN CALCIUM 20 MG PO TABS
40.0000 mg | ORAL_TABLET | Freq: Every day | ORAL | 3 refills | Status: DC
Start: 2023-11-24 — End: 2024-04-26

## 2023-11-24 MED ORDER — EZETIMIBE 10 MG PO TABS: 10.0000 mg | ORAL_TABLET | Freq: Every day | ORAL | 3 refills | Status: DC

## 2023-11-24 NOTE — Progress Notes (Signed)
 Subjective   Patient ID: Tiffany Le, female    DOB: [birth year], 48 y.o.   MRN: [id number]  Chief Complaint  Patient presents with   Follow-up    Referring provider: Ivonne Andrew, NP  Tiffany Le is a 48 y.o. female with Past Medical History: No date: Coronary artery disease 09/2020: CVA (cerebral vascular accident) [facility]) No date: Diabetes mellitus without complication (HCC) 02/2020: Diabetic neuropathy (HCC) 09/2020: Hx of CABG age 53: Hyperlipidemia age 85: Hypertension 11/2019: Hypocalcemia 11/2019: Hypokalemia No date: Iron deficiency anemia 09/2020: NSTEMI (non-ST elevated myocardial infarction) (HCC) No date: Obesity 12/09/2019: Proteinuria 09/2020: Pulmonary embolism (HCC)   HPI  Type 2 diabetes.  Currently on glipizide 5 mg twice daily, Ozempic 2 mg once weekly injection.  On Crestor 40 mg daily, Zetia 10 mg daily for hyperlipidemia.  A1C in office today is 7.1.  Patient complains today of dizziness. Denies f/c/s, n/v/d, hemoptysis, PND, leg swelling. Denies chest pain or edema.   Has completed sleep study - awaiting results    Allergies  Allergen Reactions   Oxycodone Nausea And Vomiting and Other (See Comments)    Pt states she had dizziness   Penicillins Rash    As a baby     There is no immunization history on file for this patient.  Tobacco History: Social History   Tobacco Use  Smoking Status Never  Smokeless Tobacco Never   Counseling given: Not Answered   Outpatient Encounter Medications as of 11/24/2023  Medication Sig   albuterol (PROVENTIL HFA) 108 (90 Base) MCG/ACT inhaler Inhale 2 puffs into the lungs every 6 (six) hours as needed for wheezing.   Blood Glucose Monitoring Suppl (BLOOD GLUCOSE MONITOR SYSTEM) w/Device KIT Test in the morning, at noon, and at bedtime.   Glucose Blood (BLOOD GLUCOSE TEST STRIPS) STRP Test in the morning, at noon, and at bedtime.   methocarbamol (ROBAXIN) 500 MG tablet Take 1 tablet (500 mg  total) by mouth every 8 (eight) hours as needed for muscle spasms.   nitroGLYCERIN (NITROSTAT) 0.4 MG SL tablet DISSOLVE 1 TABLET UNDER THE TONGUE EVERY 5 MINUTES FOR 3 DOSES AS NEEDED FOR CHEST PAIN   ondansetron (ZOFRAN) 4 MG tablet Take 1 tablet (4 mg total) by mouth every 8 (eight) hours as needed for nausea or vomiting.   [DISCONTINUED] aspirin EC 81 MG tablet Take 1 tablet (81 mg total) by mouth daily.   [DISCONTINUED] dapagliflozin propanediol (FARXIGA) 10 MG TABS tablet TAKE 1 TABLET(10 MG) BY MOUTH DAILY   [DISCONTINUED] ezetimibe (ZETIA) 10 MG tablet TAKE 1 TABLET(10 MG) BY MOUTH DAILY   [DISCONTINUED] glipiZIDE (GLUCOTROL) 5 MG tablet Take 1 tablet (5 mg total) by mouth 2 (two) times daily before a meal.   [DISCONTINUED] losartan (COZAAR) 50 MG tablet Take 1 tablet (50 mg total) by mouth at bedtime.   [DISCONTINUED] Semaglutide, 2 MG/DOSE, (OZEMPIC, 2 MG/DOSE,) 8 MG/3ML SOPN Inject 2 mg into the skin once a week.   [DISCONTINUED] spironolactone (ALDACTONE) 25 MG tablet Take 1 tablet (25 mg total) by mouth daily.   [DISCONTINUED] torsemide (DEMADEX) 20 MG tablet TAKE 1 TABLET(20 MG) BY MOUTH TWICE DAILY   [DISCONTINUED] traZODone (DESYREL) 50 MG tablet Take 0.5-1 tablets (25-50 mg total) by mouth at bedtime as needed for sleep.   aspirin EC 81 MG tablet Take 1 tablet (81 mg total) by mouth daily.   dapagliflozin propanediol (FARXIGA) 10 MG TABS tablet Take 1 tablet (10 mg total) by mouth daily.  ezetimibe (ZETIA) 10 MG tablet Take 1 tablet (10 mg total) by mouth daily.   gabapentin (NEURONTIN) 300 MG capsule TAKE 1 CAPSULE(300 MG) BY MOUTH TWICE DAILY (Patient not taking: Reported on 11/24/2023)   glipiZIDE (GLUCOTROL) 5 MG tablet Take 1 tablet (5 mg total) by mouth 2 (two) times daily before a meal.   ibuprofen (ADVIL) 600 MG tablet Take 1 tablet (600 mg total) by mouth every 8 (eight) hours as needed. (Patient not taking: Reported on 11/24/2023)   Insulin Pen Needle (B-D UF III MINI PEN  NEEDLES) 31G X 5 MM MISC Use as directed with insulin. (Patient not taking: Reported on 11/24/2023)   losartan (COZAAR) 50 MG tablet Take 1 tablet (50 mg total) by mouth at bedtime.   rosuvastatin (CRESTOR) 20 MG tablet Take 2 tablets (40 mg total) by mouth daily.   Semaglutide, 2 MG/DOSE, (OZEMPIC, 2 MG/DOSE,) 8 MG/3ML SOPN Inject 2 mg into the skin once a week.   spironolactone (ALDACTONE) 25 MG tablet Take 1 tablet (25 mg total) by mouth daily.   torsemide (DEMADEX) 20 MG tablet TAKE 1 TABLET(20 MG) BY MOUTH TWICE DAILY   traZODone (DESYREL) 50 MG tablet Take 0.5-1 tablets (25-50 mg total) by mouth at bedtime as needed for sleep.   [DISCONTINUED] rosuvastatin (CRESTOR) 20 MG tablet Take 2 tablets (40 mg total) by mouth daily. (Patient not taking: Reported on 11/24/2023)   No facility-administered encounter medications on file as of 11/24/2023.    Review of Systems  Review of Systems  Constitutional: Negative.   HENT: Negative.    Cardiovascular: Negative.   Gastrointestinal: Negative.   Allergic/Immunologic: Negative.   Neurological: Negative.   Psychiatric/Behavioral: Negative.       Objective:   BP (!) 146/83   Pulse 74   Temp 97.9 F (36.6 C) (Oral)   Wt 264 lb 12.8 oz (120.1 kg)   SpO2 100%   BMI 45.45 kg/m   Wt Readings from Last 5 Encounters:  11/24/23 264 lb 12.8 oz (120.1 kg)  08/19/23 260 lb (117.9 kg)  07/30/23 258 lb 9.6 oz (117.3 kg)  07/22/23 254 lb (115.2 kg)  03/25/23 254 lb 9.6 oz (115.5 kg)     Physical Exam Vitals and nursing note reviewed.  Constitutional:      General: She is not in acute distress.    Appearance: She is well-developed.  Cardiovascular:     Rate and Rhythm: Normal rate and regular rhythm.  Pulmonary:     Effort: Pulmonary effort is normal.     Breath sounds: Normal breath sounds.  Neurological:     Mental Status: She is alert and oriented to person, place, and time.       Assessment & Plan:   Type 2 diabetes mellitus  with complication, with long-term current use of insulin (HCC) -     POCT glycosylated hemoglobin (Hb A1C)  Type 2 diabetes mellitus with other specified complication, without long-term current use of insulin (HCC) -     POCT glycosylated hemoglobin (Hb A1C) -     glipiZIDE; Take 1 tablet (5 mg total) by mouth 2 (two) times daily before a meal.  Dispense: 180 tablet; Refill: 1  Essential hypertension, benign -     Torsemide; TAKE 1 TABLET(20 MG) BY MOUTH TWICE DAILY  Dispense: 60 tablet; Refill: 3  Insomnia, unspecified type -     traZODone HCl; Take 0.5-1 tablets (25-50 mg total) by mouth at bedtime as needed for sleep.  Dispense: 30 tablet;  Refill: 3  Other orders -     Aspirin EC; Take 1 tablet (81 mg total) by mouth daily.  Dispense: 90 tablet; Refill: 1 -     Ezetimibe; Take 1 tablet (10 mg total) by mouth daily.  Dispense: 90 tablet; Refill: 3 -     Losartan Potassium; Take 1 tablet (50 mg total) by mouth at bedtime.  Dispense: 90 tablet; Refill: 2 -     Rosuvastatin Calcium; Take 2 tablets (40 mg total) by mouth daily.  Dispense: 180 tablet; Refill: 3 -     Ozempic (2 MG/DOSE); Inject 2 mg into the skin once a week.  Dispense: 3 mL; Refill: 5 -     Spironolactone; Take 1 tablet (25 mg total) by mouth daily.  Dispense: 90 tablet; Refill: 2 -     Dapagliflozin Propanediol; Take 1 tablet (10 mg total) by mouth daily.  Dispense: 90 tablet; Refill: 3     Return in about 3 months (around 02/23/2024).   Jerrlyn Morel, NP 11/24/2023

## 2023-11-24 NOTE — Patient Instructions (Addendum)
 1. Type 2 diabetes mellitus with complication, with long-term current use of insulin (HCC) (Primary)  - POCT glycosylated hemoglobin (Hb A1C)   2. Type 2 diabetes mellitus with other specified complication, without long-term current use of insulin (HCC)  - POCT glycosylated hemoglobin (Hb A1C)

## 2023-11-25 LAB — LIPID PANEL
Chol/HDL Ratio: 3 ratio (ref 0.0–4.4)
Cholesterol, Total: 108 mg/dL (ref 100–199)
HDL: 36 mg/dL — ABNORMAL LOW (ref >39)
LDL Chol Calc (NIH): 60 mg/dL (ref 0–99)
Triglycerides: 47 mg/dL (ref 0–149)
VLDL Cholesterol Cal: 12 mg/dL (ref 5–40)

## 2023-11-25 LAB — COMPREHENSIVE METABOLIC PANEL WITH GFR
ALT: 21 IU/L (ref 0–32)
AST: 20 IU/L (ref 0–40)
Albumin: 4.5 g/dL (ref 3.9–4.9)
Alkaline Phosphatase: 76 IU/L (ref 44–121)
BUN/Creatinine Ratio: 16 (ref 9–23)
BUN: 15 mg/dL (ref 6–24)
Bilirubin Total: 0.3 mg/dL (ref 0.0–1.2)
CO2: 23 mmol/L (ref 20–29)
Calcium: 9.5 mg/dL (ref 8.7–10.2)
Chloride: 101 mmol/L (ref 96–106)
Creatinine, Ser: 0.95 mg/dL (ref 0.57–1.00)
Globulin, Total: 4 g/dL (ref 1.5–4.5)
Glucose: 88 mg/dL (ref 70–99)
Potassium: 4.4 mmol/L (ref 3.5–5.2)
Sodium: 139 mmol/L (ref 134–144)
Total Protein: 8.5 g/dL (ref 6.0–8.5)
eGFR: 74 mL/min/{1.73_m2} (ref >59)

## 2023-11-25 LAB — CBC
Hematocrit: 37.6 % (ref 34.0–46.6)
Hemoglobin: 12.2 g/dL (ref 11.1–15.9)
MCH: 28 pg (ref 26.6–33.0)
MCHC: 32.4 g/dL (ref 31.5–35.7)
MCV: 86 fL (ref 79–97)
Platelets: 286 10*3/uL (ref 150–450)
RBC: 4.35 x10E6/uL (ref 3.77–5.28)
RDW: 12.6 % (ref 11.7–15.4)
WBC: 6.7 10*3/uL (ref 3.4–10.8)

## 2023-12-01 ENCOUNTER — Ambulatory Visit: Admitting: Neurology

## 2023-12-01 ENCOUNTER — Encounter: Payer: Self-pay | Admitting: Neurology

## 2023-12-01 VITALS — BP 109/78 | HR 114 | Resp 15 | Ht 64.0 in

## 2023-12-01 DIAGNOSIS — H53462 Homonymous bilateral field defects, left side: Secondary | ICD-10-CM | POA: Insufficient documentation

## 2023-12-01 NOTE — Progress Notes (Signed)
 Chief Complaint  Patient presents with   New Patient (Initial Visit)    Rm14, alone. urgent internal referral for sudden loss of vision, dizziness: orthostatic bp completed, dizziness accompanied by nausea, has seen an ophthalmologist and was diagnosed w/left homonymous hemianopa and wanted neurologist to order mri       ASSESSMENT AND PLAN  Tiffany Le is a 48 y.o. 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  MEDICAL HISTORY:  Tiffany Le is a 48 year old female, seen in request by her primary care nurse practitioner   Tiffany Le for evaluation of numbness tingling in bilateral hands, legs, initial evaluation was on August 27, 2022.    I reviewed and summarized the referring note. PMHx Asthma HTN DM,  DM Peripheral neuropathy HLD CAD History of pulmonary emboli Left femur fracture    She has longstanding history of disease, diabetic peripheral neuropathy, bilateral lower extremity paresthesia,  Around July 2023, she began to notice intermittent bilateral hand numbness, involving all 5 fingers, woke up noticed fingertips paresthesia, sometimes painful, also wake her up in the middle of the night, she has to shake her hand to make sensation comes back  She denies neck pain, denies gait abnormality, no bowel or bladder incontinence  Laboratory evaluations in January 2024: LDL 70, normal CMP creatinine 1.03, CBC, hemoglobin of 12.4, A1c 6.7  UPDATE December 01 2023: EMG nerve study on May 2024 confirmed mild length-dependent  axonal sensorimotor polyneuropathy with evidence of moderate bilateral carpal tunnel syndromes  Mostly related to her diabetes, A1c was 7.1  Reported woke up 1 day in May 2020 for noticed left of having visual field deficit, has been persistent since then, was not able to see specialist due to lack of insurance coverage   She was seen by ophthalmologist on April 9th 2025, was diagnosed with left homonymous hemianopia, referred to neurology worried about stroke  Echocardiogram in June 2024, ejection fraction 55 to 60%, mild concentric left ventricular hypertrophy   PHYSICAL EXAM:   Vitals:   12/01/23 1604 12/01/23 1605 12/01/23 1606  BP: 137/88 133/82 109/78  Pulse: 96 (!) 103 (!) 114  Resp: 15    Height: 5\' 4"  (1.626 m)     Not recorded     Body mass index is 45.45 kg/m.  PHYSICAL EXAMNIATION:  Gen: NAD, conversant, well nourised, well groomed                     Cardiovascular: Regular rate rhythm, no peripheral edema, warm, nontender. Eyes: Conjunctivae clear without exudates or hemorrhage Neck: Supple, no carotid bruits. Pulmonary: Clear to auscultation bilaterally   NEUROLOGICAL EXAM:  MENTAL STATUS: Speech/cognition: Awake, alert, oriented to history taking and casual conversation CRANIAL NERVES: CN II: Left Hemi visual field deficit, pupils are round equal and briskly reactive to light. CN III, IV, VI: extraocular movement are normal. No ptosis. CN V: Facial sensation is intact to light touch CN VII: Face is symmetric with normal eye closure  CN VIII: Hearing is normal to causal conversation. CN IX, X: Phonation is  normal. CN XI: Head turning and shoulder shrug are intact  MOTOR: There is no pronator drift of out-stretched arms. Muscle bulk and tone are normal. Muscle strength is normal.  REFLEXES: Reflexes are 1 and symmetric at the biceps, triceps, knees, and ankles. Plantar responses are flexor.  SENSORY: Mild length-dependent decreased light touch,  pinprick, vibratory sensation  COORDINATION: There is no trunk or limb dysmetria noted.  GAIT/STANCE: Posture is normal. Gait is steady   REVIEW OF SYSTEMS:  Full 14 system review of systems performed and notable only for as above All other review of systems were negative.   ALLERGIES: Allergies  Allergen Reactions   Oxycodone  Nausea And Vomiting and Other (See Comments)    Pt states she had dizziness   Penicillins Rash    As a baby    HOME MEDICATIONS: Current Outpatient Medications  Medication Sig Dispense Refill   albuterol  (PROVENTIL  HFA) 108 (90 Base) MCG/ACT inhaler Inhale 2 puffs into the lungs every 6 (six) hours as needed for wheezing. 8.5 g 12   aspirin  EC 81 MG tablet Take 1 tablet (81 mg total) by mouth daily. 90 tablet 1   Blood Glucose Monitoring Suppl (BLOOD GLUCOSE MONITOR SYSTEM) w/Device KIT Test in the morning, at noon, and at bedtime. 1 kit 0   dapagliflozin  propanediol (FARXIGA ) 10 MG TABS tablet Take 1 tablet (10 mg total) by mouth daily. 90 tablet 3   ezetimibe  (ZETIA ) 10 MG tablet Take 1 tablet (10 mg total) by mouth daily. 90 tablet 3   gabapentin  (NEURONTIN ) 300 MG capsule TAKE 1 CAPSULE(300 MG) BY MOUTH TWICE DAILY 60 capsule 2   glipiZIDE  (GLUCOTROL ) 5 MG tablet Take 1 tablet (5 mg total) by mouth 2 (two) times daily before a meal. 180 tablet 1   Glucose Blood (BLOOD GLUCOSE TEST STRIPS) STRP Test in the morning, at noon, and at bedtime. 100 strip 0   ibuprofen  (ADVIL ) 600 MG tablet Take 1 tablet (600 mg total) by mouth every 8 (eight) hours as needed. 30 tablet 0   Insulin  Pen Needle (B-D UF III MINI PEN NEEDLES) 31G X 5 MM MISC Use as directed with insulin . 300 each 3   losartan  (COZAAR ) 50 MG tablet Take 1 tablet (50 mg total) by mouth at bedtime. 90 tablet 2   methocarbamol  (ROBAXIN ) 500 MG tablet Take 1 tablet (500 mg total) by mouth every 8 (eight) hours as needed for muscle spasms. 21 tablet 0   nitroGLYCERIN  (NITROSTAT ) 0.4 MG SL tablet  DISSOLVE 1 TABLET UNDER THE TONGUE EVERY 5 MINUTES FOR 3 DOSES AS NEEDED FOR CHEST PAIN 25 tablet 5   ondansetron  (ZOFRAN ) 4 MG tablet Take 1 tablet (4 mg total) by mouth every 8 (eight) hours as needed for nausea or vomiting. 20 tablet 0   rosuvastatin  (CRESTOR ) 20 MG tablet Take 2 tablets (40 mg total) by mouth daily. 180 tablet 3   Semaglutide , 2 MG/DOSE, (OZEMPIC , 2 MG/DOSE,) 8 MG/3ML SOPN Inject 2 mg into the skin once a week. 3 mL 5   spironolactone  (ALDACTONE ) 25 MG tablet Take 1 tablet (25 mg total) by mouth daily. 90 tablet 2   torsemide  (DEMADEX ) 20 MG tablet TAKE 1 TABLET(20 MG) BY MOUTH TWICE DAILY 60 tablet 3   traZODone  (DESYREL ) 50 MG tablet Take 0.5-1 tablets (25-50 mg total) by mouth at bedtime as needed for sleep. 30 tablet 3   No current facility-administered medications for this visit.    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   Hyperlipidemia age 65   Hypertension age 51   Hypocalcemia 11/2019   Hypokalemia 11/2019   Iron deficiency anemia    NSTEMI (non-ST elevated myocardial infarction) (HCC) 09/2020   Obesity    Proteinuria 12/09/2019   Pulmonary embolism (HCC) 09/2020    PAST SURGICAL HISTORY: Past Surgical History:  Procedure Laterality Date   APPLICATION OF WOUND VAC N/A 12/14/2020   Procedure: APPLICATION OF WOUND VAC;  Surgeon: Gardenia Jump, MD;  Location: [facility] OR;  Service: Thoracic;  Laterality: N/A;   APPLICATION OF WOUND VAC N/A 12/25/2020   Procedure: WOUND VAC CHANGE;  Surgeon: Rudine Cos, MD;  Location: [facility] OR;  Service: Thoracic;  Laterality: N/A;   CARDIAC CATHETERIZATION  09/25/2020   CESAREAN SECTION  3/96   CLIPPING OF ATRIAL APPENDAGE N/A 10/09/2020   Procedure: CLIPPING OF ATRIAL APPENDAGE USING ATRICURE CLIP;  Surgeon: Rudine Cos, MD;  Location: [facility] OR;  Service: Open Heart Surgery;  Laterality: N/A;   CORONARY ARTERY BYPASS GRAFT N/A 10/09/2020   Procedure: CORONARY ARTERY BYPASS GRAFTING (CABG), ON PUMP, TIMES FOUR, USING LEFT INTERNAL MAMMARY ARTERY AND LEFT RADIAL ARTERY (OPEN HARVEST);  Surgeon: Rudine Cos, MD;  [facility] OR;  Service: Open Heart Surgery;  Laterality: N/A;  POSSIBLE BIMA   INCISION AND DRAINAGE OF WOUND N/A 12/20/2020   Procedure: STERNAL WOUND DEBRIDEMENT WITH SKIN SUBSTITUTION AND ABRA;  Surgeon: Thornell Flirt, DO;  Location: [facility] OR;  Service: Plastics;  Laterality: N/A;   IR THORACENTESIS ASP PLEURAL SPACE W/IMG GUIDE  10/16/2020   LEFT HEART CATH AND CORONARY ANGIOGRAPHY N/A 09/25/2020   Procedure: LEFT HEART CATH AND CORONARY ANGIOGRAPHY;  Surgeon: Arleen Lacer, MD;  Location: [facility] INVASIVE CV LAB;  Service: Cardiovascular;  Laterality: N/A;   ORIF FEMUR FRACTURE     RADIAL ARTERY HARVEST Left 10/09/2020   Procedure: RADIAL ARTERY HARVEST;  Surgeon: Rudine Cos, MD;  Location: [facility] OR;  Service: Open Heart Surgery;  Laterality: Left;   STERNAL CLOSURE N/A 12/25/2020   Procedure: STERNAL CLOSURE;  Surgeon: Rudine Cos, MD;  Location: [facility] OR;  Service: Thoracic;  Laterality: N/A;   STERNAL WOUND DEBRIDEMENT N/A 12/14/2020   Procedure: STERNAL WOUND DEBRIDEMENT;  Surgeon: Gardenia Jump, MD;  Location: [facility] OR;  Service: Thoracic;  Laterality: N/A;   STERNAL WOUND DEBRIDEMENT N/A 12/18/2020   Procedure: STERNAL WOUND DEBRIDEMENT, Wound Vac Change;  Surgeon: Rudine Cos, MD;  Location: [facility] OR;  Service: Thoracic;  Laterality: N/A;   STERNAL WOUND DEBRIDEMENT N/A 12/25/2020   Procedure: STERNAL WOUND IRRIGATION AND DEBRIDEMENT;  Surgeon: Rudine Cos, MD;  Location: [facility] OR;  Service: Thoracic;  Laterality: N/A;   TEE WITHOUT CARDIOVERSION N/A 10/09/2020   Procedure: TRANSESOPHAGEAL ECHOCARDIOGRAM (TEE);  Surgeon: Rudine Cos, MD;  Location: [facility] OR;  Service: Open  Heart Surgery;  Laterality: N/A;    FAMILY HISTORY: Family History  Problem Relation Age of Onset   Heart disease Mother        CABG <50   Hypertension Mother    Diabetes Mother    Hyperlipidemia Mother    Heart disease Father    Hyperlipidemia Father    Hypertension Father    Early death Father    Healthy Daughter  Diabetes Maternal Aunt    Cancer Maternal Grandmother        stomach cancer   Diabetes Maternal Aunt    Cancer Cousin        colon cancer (dx'd 33)    SOCIAL HISTORY: Social History   Socioeconomic History   Marital status: Single    Spouse name: Not on file   Number of children: Not on file   Years of education: 12   Highest education level: Some college, no degree  Occupational History   Occupation: Estate manager/land agent  Tobacco Use   Smoking status: Never   Smokeless tobacco: Never  Vaping Use   Vaping status: Never Used  Substance and Sexual Activity   Alcohol use: No   Drug use: No   Sexual activity: Not Currently    Partners: Male    Comment: husband currently in [place], returning 02/2013  Other Topics Concern   Not on file  Social History Narrative   Works at a call center, 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  Lives with 61 year old daughter, 1 cat Husband is living in [place] (citizen there), trying to come to [place]  (has been delayed)   Social Drivers of Health   Financial Resource Strain: Medium Risk (11/23/2023)   Overall Financial Resource Strain (CARDIA)    Difficulty of Paying Living Expenses: Somewhat hard  Food Insecurity: Food Insecurity Present (11/23/2023)   Hunger Vital Sign    Worried About Running Out of Food in the Last Year: Sometimes true    Ran Out of Food in the Last Year: Sometimes true  Transportation Needs: No Transportation Needs (11/23/2023)   PRAPARE - Administrator, Civil Service (Medical): No    Lack of Transportation (Non-Medical): No  Physical Activity: Unknown (11/23/2023)   Exercise Vital Sign     Days of Exercise per Week: 0 days    Minutes of Exercise per Session: Not on file  Stress: Stress Concern Present (11/23/2023)   [employer] of Occupational Health - Occupational Stress Questionnaire    Feeling of Stress : Very much  Social Connections: Unknown (11/23/2023)   Social Connection and Isolation Panel [NHANES]    Frequency of Communication with Friends and Family: More than three times a week    Frequency of Social Gatherings with Friends and Family: Once a week    Attends Religious Services: Never    Database administrator or Organizations: No    Attends Engineer, structural: Not on file    Marital Status: Patient declined  Intimate Partner Violence: Not At Risk (01/08/2021)   Humiliation, Afraid, Rape, and Kick questionnaire    Fear of Current or Ex-Partner: No    Emotionally Abused: No    Physically Abused: No    Sexually Abused: No      Phebe Brasil, M.D. Ph.D.  [facility] [address], [address] [place], [place] [address] Ph: [phone] Fax: [phone]  CC:  Jerrlyn Morel, NP [phone] [place],  [place] [address]  Tiffany Le S, NP

## 2023-12-02 ENCOUNTER — Other Ambulatory Visit: Payer: Self-pay

## 2023-12-15 ENCOUNTER — Telehealth: Payer: Self-pay | Admitting: Neurology

## 2023-12-15 NOTE — Telephone Encounter (Signed)
 [facility] [employer]: [id number] exp. 12/03/23-02/01/24 sent to GI [phone]

## 2023-12-22 ENCOUNTER — Encounter (HOSPITAL_BASED_OUTPATIENT_CLINIC_OR_DEPARTMENT_OTHER): Payer: Self-pay | Admitting: Cardiovascular Disease

## 2023-12-22 NOTE — Procedures (Signed)
 [facility] [address] [place], [place] [address] Tel: [phone]   Fax: [phone]  Home Sleep Test Interpretation  Patient Name: Tiffany Le, Tiffany Le Study Date: 11/19/2023  Date of Birth: [birth year] Study Type: HST  Age: 48 year MRN #: [id number]  Sex: Female Interpreting Physician: Magnus Schuller [id number]  Height: 5\' 4"  Referring Physician: Alyssa Backbone, MD  Weight: 254.0 lbs Recording Tech: Alice Ao RPSGT RST  BMI: 43.9 Scoring Tech: Holly Neeriemer RPSGT RST  ESS: 17 Neck Size: 15   Indications for Polysomnography The patient is a 48 year-old Female who is 5\' 4"  and weighs 254.0 lbs. Her BMI equals 43.9.  A home sleep apnea test was performed to evaluate for -.  Medication  No Data.   Polysomnogram Data A home sleep test recorded the standard physiologic parameters including EKG, nasal and oral airflow.  Respiratory parameters of chest and abdominal movements were recorded with Respiratory Inductance Plethysmography belts.  Oxygen saturation was recorded by pulse oximetry.   Study Architecture The total recording time of the polysomnogram was 345.6 minutes.  The total monitoring time was 346.0 minutes.  Time spent in Supine position was 8.0 minutes.   Respiratory Events The study revealed a presence of 8 obstructive, - central, and - mixed apneas resulting in an Apnea index of 1.4 events per hour.  There were 45 hypopneas (>=3% desaturation and/or arousal) resulting in an Apnea\Hypopnea Index (AHI >=3% desaturation and/or arousal) of 9.2 events per hour.  There were 30 hypopneas (>=4% desaturation) resulting in an Apnea\Hypopnea Index (AHI >=4% desaturation) of 6.6 events per hour.  There were - Respiratory Effort Related Arousals resulting in a RERA index of - events per hour. The Respiratory Disturbance Index is 9.2 events per hour.  The snore index was 0.5 events per hour.  Mean oxygen saturation was 97.2%.  The lowest  oxygen saturation during monitoring time was 73.0%.  Time spent <=88% oxygen saturation was 1.5 minutes (0.4%).  Cardiac Summary The average pulse rate was 74.3 bpm.  The minimum pulse rate was 48.0 bpm while the maximum pulse rate was 103.0 bpm.  Cardiac rhythm was normal/abnormal.  Diagnosis:  Mild sleep apnea Nocturnal hypoxemia Snoring  Recommendations: In this patient with significant cardiovascular co-morbidities recommend initiation of Auto-PAP with EPR of 3 at 6 - 16 cm of water . Effort should be made to optimize nasal and oropharyngeal patency. If patient is against CPAP a customized oral appliance can be considered as alternative therapy. Avoid sedatives and CNS depressants which may exacerbate sleep apnea. Sleep apnea should be reviewed to assess factors that may improve sleep quality. Weight management and exercise is recommended.   This study was personally reviewed and electronically signed by: Magnus Schuller, MD Accredited Board Certified in Sleep Medicine Date/Time: Dec 22, 2023/10:25 am   Study Overview  Recording Time: 354.3 min. Monitoring Time: 346.0 min.  Analysis Start:  01:19:23 AM Supine Time: 8.0 min.  Analysis Stop:  07:04:59 AM     Study Summary   Count Index Longest Event Duration  Apneas & Hypopneas: 53 9.2  Apneas: 19.1 sec.     Hypopneas: 59.0 sec.  RERAs: - - - sec.  Desaturations: 53 9.2 124.0 sec.  Snores: 3 0.5 3.8 sec.    Minimum Oxygen Saturation: 73.0%    Respiratory Summary   Total Duration Supine Non-Supine   Count Index Average Longest Count Index Count Index  Obstructive Apnea 8 1.4 14.1 19.1 - - 8  1.4   Mixed Apnea - - - - - - - -   Central Apnea - - - - - - - -   Total Apneas 8 1.4 14.1 19.1 - - 8 1.4            Hypopneas 3% 45 7.8 N.A. N.A. - - 45 8.0   Apneas & Hyp. 3% 53 9.2 N.A. N.A. - - 53 9.4            Hypopneas 4% 30 5.2 N.A. N.A. - - 30 5.3  Apneas & Hyp. 4% 38 6.6 N.A. N.A. - - 38 6.7             RERAs - - -  - - - - -  RDI 53 9.2 N.A. N.A. - - 53 9.4   Oxygen Saturation Summary   Total Supine Non-Supine  Average SpO2 97.2% 98.1% 97.2%  Minimum SpO2 73.0% 73.0% 79.0%   Maximum SpO2 100.0% 100.0% 100.0%   Oxygen Saturation Distribution  Range (%) Time in range (min) Time in range (%)  90.0 - 100.0 343.9 99.4%  80.0 - 90.0 1.9 0.6%  70.0 - 80.0 0.1 0.0%  60.0 - 70.0 - -  50.0 - 60.0 - -  0.0 - 50.0 - -  Time Spent <=88% SpO2  Range (%) Time in range (min) Time in range (%)  0.0 - 88.0 1.5 0.4%   Cardiac Summary   Total Supine Non-Supine  Average Pulse Rate (BPM) 74.3 84.0 74.0  Minimum Pulse Rate (BPM) 48.0 73.0 48.0  Maximum Pulse Rate (BPM) 103.0 97.0 103.0                         ELECTRONICALLY SIGNED ON:  12/22/2023, 10:47 AM [facility] PH: [phone]   FX: [phone] ACCREDITED BY THE AMERICAN ACADEMY OF SLEEP MEDICINE

## 2023-12-23 ENCOUNTER — Telehealth: Payer: Self-pay

## 2023-12-23 NOTE — Telephone Encounter (Signed)
-----   Message from Magnus Schuller sent at 12/22/2023 10:56 AM EDT ----- Cody Das please notify patient the results of her sleep study.  With significant cardiovascular comorbidities recommend initiation of AutoPap as prescribed.

## 2023-12-23 NOTE — Telephone Encounter (Signed)
 Left VM with callback number for patient to receive sleep study results and recommendations.

## 2023-12-28 ENCOUNTER — Other Ambulatory Visit

## 2023-12-31 ENCOUNTER — Other Ambulatory Visit

## 2024-01-11 ENCOUNTER — Inpatient Hospital Stay: Admission: RE | Admit: 2024-01-11 | Source: Ambulatory Visit

## 2024-01-16 ENCOUNTER — Other Ambulatory Visit

## 2024-01-25 ENCOUNTER — Encounter: Payer: Self-pay | Admitting: Internal Medicine

## 2024-01-25 ENCOUNTER — Other Ambulatory Visit

## 2024-01-30 ENCOUNTER — Ambulatory Visit
Admission: RE | Admit: 2024-01-30 | Discharge: 2024-01-30 | Disposition: A | Discharge status: Home / Self Care | Source: Ambulatory Visit | Attending: Neurology | Admitting: Neurology

## 2024-01-30 DIAGNOSIS — H53462 Homonymous bilateral field defects, left side: Secondary | ICD-10-CM | POA: Diagnosis not present

## 2024-02-01 ENCOUNTER — Ambulatory Visit: Payer: Self-pay | Admitting: Neurology

## 2024-02-02 ENCOUNTER — Telehealth: Payer: Self-pay

## 2024-02-02 DIAGNOSIS — G4733 Obstructive sleep apnea (adult) (pediatric): Secondary | ICD-10-CM

## 2024-02-02 DIAGNOSIS — I152 Hypertension secondary to endocrine disorders: Secondary | ICD-10-CM

## 2024-02-02 DIAGNOSIS — I25110 Atherosclerotic heart disease of native coronary artery with unstable angina pectoris: Secondary | ICD-10-CM

## 2024-02-02 DIAGNOSIS — I639 Cerebral infarction, unspecified: Secondary | ICD-10-CM

## 2024-02-02 DIAGNOSIS — Z794 Long term (current) use of insulin: Secondary | ICD-10-CM

## 2024-02-02 DIAGNOSIS — I5032 Chronic diastolic (congestive) heart failure: Secondary | ICD-10-CM

## 2024-02-02 NOTE — Telephone Encounter (Signed)
-----   Message from Magnus Schuller sent at 12/22/2023 10:56 AM EDT ----- Cody Das please notify patient the results of her sleep study.  With significant cardiovascular comorbidities recommend initiation of AutoPap as prescribed.

## 2024-02-02 NOTE — Telephone Encounter (Signed)
 Notified patient of sleep study results and recommendations. All questions were answered and patient verbalized understanding.

## 2024-02-15 ENCOUNTER — Other Ambulatory Visit (HOSPITAL_COMMUNITY): Payer: Self-pay

## 2024-02-15 ENCOUNTER — Telehealth: Payer: Self-pay | Admitting: Neurology

## 2024-02-15 NOTE — Telephone Encounter (Signed)
 Pt called to request MRI result explained the patient stated she ready MyChart message but would like a call to go more into details  with her

## 2024-02-16 NOTE — Telephone Encounter (Signed)
 Left message for pt to return call.  2nd call.

## 2024-02-16 NOTE — Telephone Encounter (Signed)
 Lvm 1st attempt by hf 02/16/24

## 2024-02-16 NOTE — Telephone Encounter (Signed)
 Called and spoke to pt and relayed the following results:  Evidence of stroke involving right occipital region, and cerebral hemisphere which was present and acute in MRI from March 2022.   Please continue aspirin  81 mg daily, and keep follow up appt as scheduled  Pt voiced gratitude and understanding of all discussed

## 2024-02-24 ENCOUNTER — Ambulatory Visit: Payer: Self-pay | Admitting: Nurse Practitioner

## 2024-02-26 ENCOUNTER — Ambulatory Visit
Admission: EM | Admit: 2024-02-26 | Discharge: 2024-02-26 | Disposition: A | Discharge status: Home / Self Care | Attending: Family Medicine | Admitting: Family Medicine

## 2024-02-26 ENCOUNTER — Ambulatory Visit (INDEPENDENT_AMBULATORY_CARE_PROVIDER_SITE_OTHER)

## 2024-02-26 DIAGNOSIS — S8701XA Crushing injury of right knee, initial encounter: Secondary | ICD-10-CM | POA: Diagnosis not present

## 2024-02-26 DIAGNOSIS — S8991XA Unspecified injury of right lower leg, initial encounter: Secondary | ICD-10-CM

## 2024-02-26 HISTORY — DX: Cardiac murmur, unspecified: R01.1

## 2024-02-26 HISTORY — DX: Heart failure, unspecified: I50.9

## 2024-02-26 MED ORDER — NAPROXEN 375 MG PO TABS: 375.0000 mg | ORAL_TABLET | Freq: Two times a day (BID) | ORAL | 0 refills | Status: DC

## 2024-02-26 NOTE — ED Triage Notes (Signed)
 Yesterday I went into my storage unit and their was something on the floor, the light didn't come on immediately, I slipped with my left leg but landed on my right knee, still sore, swelling, hurts to walk sometimes and feel's tight. No head injury. No laceration. No abrasion.

## 2024-02-26 NOTE — ED Provider Notes (Signed)
 [name]    CSN: [id number] Arrival date & time: 02/26/24  9071      History   Chief Complaint Chief Complaint  Patient presents with   Fall   Knee Injury    HPI Tiffany Le is a 48 y.o. female.   Patient presents today for evaluation of a right knee injury after she slipped and fell at her storage unit.  She is uncertain as to what she slipped on but endorses that when she fell she landed her right knee.  She has a history of a previous surgery on the right lower extremity.  She has had swelling and pain with ambulation.  Swelling has improved although she continues to be concerned for possible fracture.  She has not taking/ medications today for the pain.  Past Medical History:  Diagnosis Date   CHF (congestive heart failure) (HCC)    Coronary artery disease    CVA (cerebral vascular accident) (HCC) 09/2020   Diabetes mellitus without complication (HCC)    Diabetic neuropathy (HCC) 02/2020   Heart murmur    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   Hyperlipidemia age 34   Hypertension age 89   Hypocalcemia 11/2019   Hypokalemia 11/2019   Iron deficiency anemia    NSTEMI (non-ST elevated myocardial infarction) (HCC) 09/2020   Obesity    Proteinuria 12/09/2019   Pulmonary embolism (HCC) 09/2020    Patient Active Problem List   Diagnosis Date Noted   Hemianopia of left eye 12/01/2023   Left homonymous hemianopsia 11/18/2023   Proteinuria 08/19/2023   Paresthesia 08/27/2022   Diabetes mellitus (HCC) 03/12/2022   Hyperlipidemia 11/15/2021   Visit for wound check 01/08/2021   Sternal wound dehiscence 12/14/2020   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)    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   Pulmonary edema 10/02/2020   Acute respiratory failure with hypoxia (HCC)    Coronary artery disease involving native coronary artery of native heart with unstable angina pectoris (HCC)    Acute on chronic combined systolic (congestive) and  diastolic (congestive) heart failure (HCC)    NSTEMI (non-ST elevated myocardial infarction) (HCC) 09/25/2020   Type 2 diabetes mellitus with complication, with long-term current use of insulin  (HCC)    Shortness of breath 12/25/2019   Hemoglobin A1C greater than 9%, indicating poor diabetic control 12/25/2019   Sepsis due to urinary tract infection (HCC) 12/03/2019   Sepsis secondary to UTI (HCC) 12/02/2019   Class 3 severe obesity due to excess calories with serious comorbidity and body mass index (BMI) of 40.0 to 44.9 in adult 10/24/2018   Back pain 10/24/2018   Elevated LFTs 01/13/2014   Vitamin D  deficiency 01/13/2014   Anemia, iron deficiency 11/17/2012   Essential hypertension, benign 11/17/2012   Pure hypercholesterolemia 11/17/2012    Past Surgical History:  Procedure Laterality Date   APPLICATION OF WOUND VAC N/A 12/14/2020   Procedure: APPLICATION OF WOUND VAC;  Surgeon: Dusty Sudie DEL, MD;  Location: [facility] OR;  Service: Thoracic;  Laterality: N/A;   APPLICATION OF WOUND VAC N/A 12/25/2020   Procedure: WOUND VAC CHANGE;  Surgeon: German Bartlett PEDLAR, MD;  Location: [facility] OR;  Service: Thoracic;  Laterality: N/A;   CARDIAC CATHETERIZATION  09/25/2020   CESAREAN SECTION  3/96   CLIPPING OF ATRIAL APPENDAGE N/A 10/09/2020   Procedure: CLIPPING OF ATRIAL APPENDAGE USING ATRICURE CLIP;  Surgeon: German Bartlett PEDLAR, MD;  Location: [facility] OR;  Service: Open Heart Surgery;  Laterality: N/A;   CORONARY ARTERY BYPASS GRAFT N/A 10/09/2020   Procedure: CORONARY ARTERY BYPASS GRAFTING (CABG), ON PUMP, TIMES FOUR, USING LEFT INTERNAL MAMMARY ARTERY AND LEFT RADIAL ARTERY (OPEN HARVEST);  Surgeon: German Bartlett PEDLAR, MD;  Location: [facility] OR;  Service: Open Heart Surgery;  Laterality: N/A;  POSSIBLE BIMA   INCISION AND DRAINAGE OF WOUND N/A 12/20/2020   Procedure: STERNAL WOUND DEBRIDEMENT WITH SKIN SUBSTITUTION AND ABRA;  Surgeon: Lowery Estefana RAMAN, DO;  Location: [facility] OR;  Service: Plastics;  Laterality:  N/A;   IR THORACENTESIS ASP PLEURAL SPACE W/IMG GUIDE  10/16/2020   LEFT HEART CATH AND CORONARY ANGIOGRAPHY N/A 09/25/2020   Procedure: LEFT HEART CATH AND CORONARY ANGIOGRAPHY;  Surgeon: Anner Alm ORN, MD;  Location: [facility] INVASIVE CV LAB;  Service: Cardiovascular;  Laterality: N/A;   ORIF FEMUR FRACTURE     RADIAL ARTERY HARVEST Left 10/09/2020   Procedure: RADIAL ARTERY HARVEST;  Surgeon: German Bartlett PEDLAR, MD;  Location: [facility] OR;  Service: Open Heart Surgery;  Laterality: Left;   STERNAL CLOSURE N/A 12/25/2020   Procedure: STERNAL CLOSURE;  Surgeon: German Bartlett PEDLAR, MD;  Location: [facility] OR;  Service: Thoracic;  Laterality: N/A;   STERNAL WOUND DEBRIDEMENT N/A 12/14/2020   Procedure: STERNAL WOUND DEBRIDEMENT;  Surgeon: Dusty Sudie DEL, MD;  Location: [facility] OR;  Service: Thoracic;  Laterality: N/A;   STERNAL WOUND DEBRIDEMENT N/A 12/18/2020   Procedure: STERNAL WOUND DEBRIDEMENT, Wound Vac Change;  Surgeon: German Bartlett PEDLAR, MD;  Location: [facility] OR;  Service: Thoracic;  Laterality: N/A;   STERNAL WOUND DEBRIDEMENT N/A 12/25/2020   Procedure: STERNAL WOUND IRRIGATION AND DEBRIDEMENT;  Surgeon: German Bartlett PEDLAR, MD;  Location: [facility] OR;  Service: Thoracic;  Laterality: N/A;   TEE WITHOUT CARDIOVERSION N/A 10/09/2020   Procedure: TRANSESOPHAGEAL ECHOCARDIOGRAM (TEE);  Surgeon: German Bartlett PEDLAR, MD;  Location: [facility] OR;  Service: Open Heart Surgery;  Laterality: N/A;    OB History     Gravida  1   Para  1   Term      Preterm      AB      Living  1      SAB      IAB      Ectopic      Multiple      Live Births               Home Medications    Prior to Admission medications   Medication Sig Start Date End Date Taking? Authorizing Provider  naproxen  (NAPROSYN ) 375 MG tablet Take 1 tablet (375 mg total) by mouth 2 (two) times daily. 02/26/24  Yes Arloa Suzen RAMAN, NP  albuterol  (PROVENTIL  HFA) 108 (90 Base) MCG/ACT inhaler Inhale 2 puffs into the lungs every 6 (six) hours as needed for  wheezing. 08/14/22   Oley Bascom RAMAN, NP  aspirin  EC 81 MG tablet Take 1 tablet (81 mg total) by mouth daily. 11/24/23   Oley Bascom RAMAN, NP  Blood Glucose Monitoring Suppl (BLOOD GLUCOSE MONITOR SYSTEM) w/Device KIT Test in the morning, at noon, and at bedtime. 03/26/23   Paseda, Folashade R, FNP  dapagliflozin  propanediol (FARXIGA ) 10 MG TABS tablet Take 1 tablet (10 mg total) by mouth daily. 11/24/23   Oley Bascom RAMAN, NP  ezetimibe  (ZETIA ) 10 MG tablet Take 1 tablet (10 mg total) by mouth daily. 11/24/23   Oley Bascom RAMAN, NP  gabapentin  (NEURONTIN ) 300 MG capsule TAKE 1 CAPSULE(300 MG) BY MOUTH TWICE DAILY 12/09/21  Shannan Sia I, NP  glipiZIDE  (GLUCOTROL ) 5 MG tablet Take 1 tablet (5 mg total) by mouth 2 (two) times daily before a meal. 11/24/23   Oley Bascom RAMAN, NP  Glucose Blood (BLOOD GLUCOSE TEST STRIPS) STRP Test in the morning, at noon, and at bedtime. 03/26/23   Paseda, Folashade R, FNP  Insulin  Pen Needle (B-D UF III MINI PEN NEEDLES) 31G X 5 MM MISC Use as directed with insulin . 05/15/22   Oley Bascom RAMAN, NP  losartan  (COZAAR ) 50 MG tablet Take 1 tablet (50 mg total) by mouth at bedtime. 11/24/23   Oley Bascom RAMAN, NP  methocarbamol  (ROBAXIN ) 500 MG tablet Take 1 tablet (500 mg total) by mouth every 8 (eight) hours as needed for muscle spasms. 08/14/22   Oley Bascom RAMAN, NP  nitroGLYCERIN  (NITROSTAT ) 0.4 MG SL tablet DISSOLVE 1 TABLET UNDER THE TONGUE EVERY 5 MINUTES FOR 3 DOSES AS NEEDED FOR CHEST PAIN 11/15/21   Thukkani, Arun K, MD  ondansetron  (ZOFRAN ) 4 MG tablet Take 1 tablet (4 mg total) by mouth every 8 (eight) hours as needed for nausea or vomiting. 03/25/23   Paseda, Folashade R, FNP  rosuvastatin  (CRESTOR ) 20 MG tablet Take 2 tablets (40 mg total) by mouth daily. 11/24/23 02/22/24  Oley Bascom RAMAN, NP  Semaglutide , 2 MG/DOSE, (OZEMPIC , 2 MG/DOSE,) 8 MG/3ML SOPN Inject 2 mg into the skin once a week. 11/24/23   Oley Bascom RAMAN, NP  spironolactone  (ALDACTONE ) 25 MG tablet Take 1  tablet (25 mg total) by mouth daily. 11/24/23   Oley Bascom RAMAN, NP  torsemide  (DEMADEX ) 20 MG tablet TAKE 1 TABLET(20 MG) BY MOUTH TWICE DAILY 11/24/23   Nichols, Tonya S, NP  traZODone  (DESYREL ) 50 MG tablet Take 0.5-1 tablets (25-50 mg total) by mouth at bedtime as needed for sleep. 11/24/23   Oley Bascom RAMAN, NP    Family History Family History  Problem Relation Age of Onset   Heart disease Mother        CABG <50   Hypertension Mother    Diabetes Mother    Hyperlipidemia Mother    Heart attack Mother    Heart disease Father    Hyperlipidemia Father    Hypertension Father    Early death Father    Healthy Daughter    Diabetes Maternal Aunt    Cancer Maternal Grandmother        stomach cancer   Diabetes Maternal Aunt    Cancer Cousin        colon cancer (dx'd 70)    Social History Social History   Tobacco Use   Smoking status: Never   Smokeless tobacco: Never  Vaping Use   Vaping status: Never Used  Substance Use Topics   Alcohol use: No   Drug use: No     Allergies   Oxycodone  and Penicillins   Review of Systems Review of Systems Pertinent negatives listed in HPI   Physical Exam Triage Vital Signs ED Triage Vitals  Encounter Vitals Group     BP 02/26/24 1005 125/81     Girls Systolic BP Percentile --      Girls Diastolic BP Percentile --      Boys Systolic BP Percentile --      Boys Diastolic BP Percentile --      Pulse Rate 02/26/24 1005 87     Resp 02/26/24 1005 20     Temp 02/26/24 1005 98.4 F (36.9 C)     Temp Source 02/26/24 1005 Oral  SpO2 02/26/24 1005 97 %     Weight 02/26/24 1002 264 lb (119.7 kg)     Height 02/26/24 1002 5' 4 (1.626 m)     Head Circumference --      Peak Flow --      Pain Score 02/26/24 1000 5     Pain Loc --      Pain Education --      Exclude from Growth Chart --    No data found.  Updated Vital Signs BP 125/81 (BP Location: Left Arm)   Pulse 87   Temp 98.4 F (36.9 C) (Oral)   Resp 20   Ht 5' 4  (1.626 m)   Wt 264 lb (119.7 kg)   LMP 01/24/2024 (Approximate)   SpO2 97%   BMI 45.32 kg/m   Visual Acuity Right Eye Distance:   Left Eye Distance:   Bilateral Distance:    Right Eye Near:   Left Eye Near:    Bilateral Near:     Physical Exam Vitals reviewed.  Constitutional:      Appearance: Normal appearance.  HENT:     Head: Normocephalic and atraumatic.  Eyes:     Extraocular Movements: Extraocular movements intact.     Pupils: Pupils are equal, round, and reactive to light.  Cardiovascular:     Rate and Rhythm: Normal rate and regular rhythm.  Pulmonary:     Effort: Pulmonary effort is normal.     Breath sounds: Normal breath sounds.  Musculoskeletal:     Cervical back: Normal range of motion and neck supple.     Right knee: Bony tenderness present. Normal range of motion. Tenderness present over the patellar tendon.  Skin:    General: Skin is warm and dry.     Capillary Refill: Capillary refill takes less than 2 seconds.  Neurological:     General: No focal deficit present.     Mental Status: She is alert.      UC Treatments / Results  Labs (all labs ordered are listed, but only abnormal results are displayed) Labs Reviewed - No data to display  EKG   Radiology No results found.   Procedures Procedures (including critical care time)  Medications Ordered in UC Medications - No data to display  Initial Impression / Assessment and Plan / UC Course  I have reviewed the triage vital signs and the nursing notes.  Pertinent labs & imaging results that were available during my care of the patient were reviewed by me and considered in my medical decision making (see chart for details).    Injury involving the right knee, imaging negative for any acute fracture.  Patient is freely ambulatory but endorses some persistent swelling and achiness with ambulation.  Recommendation include Naprosyn  375 twice daily.  Applications of ice in 10-minute increments  as tolerated.  May resume normal activity as tolerated.  Follow-up with PCP or orthopedics if symptoms worsen or do not improve. Final Clinical Impressions(s) / UC Diagnoses   Final diagnoses:  Injury of right knee, initial encounter     Discharge Instructions      Take Naprosyn  375 up to every 12 hours as needed for knee pain or swelling.  Taking aspirin  for at least 24 hours when taking this medication.  If swelling or achiness persist recommend 10-minute increments applications of ice reduce inflammation.  X-ray is negative for any chronic changes and negative for any location therefore injury is self-limiting will resolve at rest.  ED Prescriptions     Medication Sig Dispense Auth. Provider   naproxen  (NAPROSYN ) 375 MG tablet Take 1 tablet (375 mg total) by mouth 2 (two) times daily. 20 tablet Arloa Suzen RAMAN, NP      PDMP not reviewed this encounter.   Arloa Suzen RAMAN, NP 02/29/24 [phone]

## 2024-02-26 NOTE — Discharge Instructions (Signed)
 Take Naprosyn  375 up to every 12 hours as needed for knee pain or swelling.  Taking aspirin  for at least 24 hours when taking this medication.  If swelling or achiness persist recommend 10-minute increments applications of ice reduce inflammation.  X-ray is negative for any chronic changes and negative for any location therefore injury is self-limiting will resolve at rest.

## 2024-03-01 ENCOUNTER — Other Ambulatory Visit: Payer: Self-pay | Admitting: Nurse Practitioner

## 2024-03-01 DIAGNOSIS — E1169 Type 2 diabetes mellitus with other specified complication: Secondary | ICD-10-CM

## 2024-03-02 ENCOUNTER — Ambulatory Visit: Payer: Self-pay

## 2024-03-02 ENCOUNTER — Ambulatory Visit: Payer: Self-pay | Admitting: Nurse Practitioner

## 2024-03-02 NOTE — Telephone Encounter (Signed)
 FYI Only or Action Required?: Action required by provider: request for appointment.  Patient was last seen in primary care on 11/24/2023 by Oley Bascom RAMAN, NP.  Called Nurse Triage reporting Knee Injury.  Symptoms began several days ago.  Interventions attempted: Rest, hydration, or home remedies and Ice/heat application.  Symptoms are: unchanged.  Triage Disposition: See PCP When Office is Open (Within 3 Days)-needs a phone call in regards to scheduling for three month follow up  Patient/caregiver understands and will follow disposition?: No, wishes to speak with PCP  Copied from CRM #[id number]. Topic: Clinical - Red Word Triage >> Mar 02, 2024  4:13 PM Ivette P wrote: Red Word that prompted transfer to Nurse Triage: knot on top of wrist. fell in storage in unit als Thursday 07/17 - whole knee was swollen like a goose egg. UC advised very hard hit . knot on knee.     ----------------------------------------------------------------------- From previous Reason for Contact - Scheduling: Patient/patient representative is calling to schedule an appointment. Refer to attachments for appointment information. Reason for Disposition  [1] After 3 days AND [2] pain not improved  Answer Assessment - Initial Assessment Questions 1. MECHANISM: How did the injury happen? (e.g., twisting injury, direct blow)      Patient slipped on floor of a storage unit where there was some oil.  2. ONSET: When did the injury happen? (e.g., minutes, hours ago)      7/17 3. LOCATION: Where is the injury located?      Right knee 4. APPEARANCE of INJURY: What does the injury look like?      No visible swelling but patient states she can feel the knot that it still on top of the knee.  5. SEVERITY: Can you put weight on that leg? Can you walk?      Able to walk without any issues 6. SIZE: For cuts, bruises, or swelling, ask: How large is it? (e.g., inches or centimeters;  entire joint)       N/A 7. PAIN: Is there pain? If Yes, ask: How bad is the pain?   What does it keep you from doing? (Scale 0-10; or none, mild, moderate, severe)     Mild  9. OTHER SYMPTOMS: Do you have any other symptoms?  (e.g., pop when knee injured, swelling, locking, buckling)      No  Patient reports having already been evaluated at [facility]. Patient was calling initially to reschedule her three month follow up appointment. Patient will need a call back from clinic. Patient has time requests for appointment time. Patient would also like her knee to be reevaluated.  Protocols used: Knee Injury-A-AH

## 2024-03-14 ENCOUNTER — Telehealth: Payer: Self-pay

## 2024-03-14 NOTE — Telephone Encounter (Signed)
 Pharmacy Patient Advocate Encounter  Received notification from [facility] Medicaid that Prior Authorization for OZEMPIC  has been APPROVED from 02/29/2024 to 03/14/2025

## 2024-03-30 ENCOUNTER — Telehealth: Payer: Self-pay

## 2024-03-30 ENCOUNTER — Other Ambulatory Visit: Payer: Self-pay

## 2024-03-30 ENCOUNTER — Other Ambulatory Visit: Payer: Self-pay | Admitting: Nurse Practitioner

## 2024-03-30 DIAGNOSIS — R110 Nausea: Secondary | ICD-10-CM

## 2024-03-30 MED ORDER — ONDANSETRON HCL 4 MG PO TABS: 4.0000 mg | ORAL_TABLET | Freq: Three times a day (TID) | ORAL | 0 refills | Status: DC | PRN

## 2024-03-30 NOTE — Telephone Encounter (Unsigned)
 Copied from CRM #[id number]. Topic: Clinical - Medication Refill >> Mar 30, 2024 11:45 AM Amy B wrote: Medication:  ondansetron  (ZOFRAN ) 4 MG tablet   Has the patient contacted their pharmacy? No (Agent: If no, request that the patient contact the pharmacy for the refill. If patient does not wish to contact the pharmacy document the reason why and proceed with request.) (Agent: If yes, when and what did the pharmacy advise?)  This is the patient's preferred pharmacy:  [id number] - [place], [place] - [address] AT [facility] [address] [place] [id number] Phone: [phone] Fax: [phone]  Is this the correct pharmacy for this prescription? Yes If no, delete pharmacy and type the correct one.   Has the prescription been filled recently? No  Is the patient out of the medication? Yes  Has the patient been seen for an appointment in the last year OR does the patient have an upcoming appointment? Yes  Can we respond through MyChart? Yes  Agent: Please be advised that Rx refills may take up to 3 business days. We ask that you follow-up with your pharmacy.

## 2024-03-30 NOTE — Telephone Encounter (Signed)
 Copied from CRM #[id number]. Topic: Clinical - Medication Refill >> Mar 30, 2024 11:45 AM Amy B wrote: Medication:  ondansetron  (ZOFRAN ) 4 MG tablet   Has the patient contacted their pharmacy? No (Agent: If no, request that the patient contact the pharmacy for the refill. If patient does not wish to contact the pharmacy document the reason why and proceed with request.) (Agent: If yes, when and what did the pharmacy advise?)  This is the patient's preferred pharmacy:  [id number] - [place], [place] - [address] AT [facility] [address] [place] [phone] Phone: [phone] Fax: [phone]  Is this the correct pharmacy for this prescription? Yes If no, delete pharmacy and type the correct one.   Has the prescription been filled recently? No  Is the patient out of the medication? Yes  Has the patient been seen for an appointment in the last year OR does the patient have an upcoming appointment? Yes  Can we respond through MyChart? Yes  Agent: Please be advised that Rx refills may take up to 3 business days. We ask that you follow-up with your pharmacy.

## 2024-04-06 ENCOUNTER — Other Ambulatory Visit: Payer: Self-pay | Admitting: Internal Medicine

## 2024-04-26 ENCOUNTER — Encounter: Payer: Self-pay | Admitting: Nurse Practitioner

## 2024-04-26 ENCOUNTER — Ambulatory Visit (INDEPENDENT_AMBULATORY_CARE_PROVIDER_SITE_OTHER): Payer: Self-pay | Admitting: Nurse Practitioner

## 2024-04-26 VITALS — BP 133/58 | HR 81 | Wt 263.0 lb

## 2024-04-26 DIAGNOSIS — F339 Major depressive disorder, recurrent, unspecified: Secondary | ICD-10-CM | POA: Insufficient documentation

## 2024-04-26 DIAGNOSIS — E785 Hyperlipidemia, unspecified: Secondary | ICD-10-CM

## 2024-04-26 DIAGNOSIS — I10 Essential (primary) hypertension: Secondary | ICD-10-CM

## 2024-04-26 DIAGNOSIS — R202 Paresthesia of skin: Secondary | ICD-10-CM

## 2024-04-26 DIAGNOSIS — E1140 Type 2 diabetes mellitus with diabetic neuropathy, unspecified: Secondary | ICD-10-CM | POA: Diagnosis not present

## 2024-04-26 DIAGNOSIS — E1165 Type 2 diabetes mellitus with hyperglycemia: Secondary | ICD-10-CM

## 2024-04-26 DIAGNOSIS — R5383 Other fatigue: Secondary | ICD-10-CM

## 2024-04-26 DIAGNOSIS — I5032 Chronic diastolic (congestive) heart failure: Secondary | ICD-10-CM

## 2024-04-26 DIAGNOSIS — R0602 Shortness of breath: Secondary | ICD-10-CM | POA: Diagnosis not present

## 2024-04-26 DIAGNOSIS — F32A Depression, unspecified: Secondary | ICD-10-CM

## 2024-04-26 DIAGNOSIS — Z1231 Encounter for screening mammogram for malignant neoplasm of breast: Secondary | ICD-10-CM

## 2024-04-26 DIAGNOSIS — Z124 Encounter for screening for malignant neoplasm of cervix: Secondary | ICD-10-CM

## 2024-04-26 DIAGNOSIS — I25110 Atherosclerotic heart disease of native coronary artery with unstable angina pectoris: Secondary | ICD-10-CM

## 2024-04-26 DIAGNOSIS — R2231 Localized swelling, mass and lump, right upper limb: Secondary | ICD-10-CM

## 2024-04-26 DIAGNOSIS — F419 Anxiety disorder, unspecified: Secondary | ICD-10-CM

## 2024-04-26 DIAGNOSIS — G4733 Obstructive sleep apnea (adult) (pediatric): Secondary | ICD-10-CM

## 2024-04-26 LAB — POCT GLYCOSYLATED HEMOGLOBIN (HGB A1C): Hemoglobin A1C: 7.6 % — AB (ref 4.0–5.6)

## 2024-04-26 MED ORDER — EZETIMIBE 10 MG PO TABS: 10.0000 mg | ORAL_TABLET | Freq: Every day | ORAL | 3 refills | Status: AC

## 2024-04-26 MED ORDER — OZEMPIC (2 MG/DOSE) 8 MG/3ML ~~LOC~~ SOPN: 2.0000 mg | PEN_INJECTOR | SUBCUTANEOUS | 0 refills | Status: DC

## 2024-04-26 MED ORDER — ROSUVASTATIN CALCIUM 20 MG PO TABS: 40.0000 mg | ORAL_TABLET | Freq: Every day | ORAL | 3 refills | Status: AC

## 2024-04-26 MED ORDER — NITROGLYCERIN 0.4 MG SL SUBL: SUBLINGUAL_TABLET | SUBLINGUAL | 5 refills | Status: AC

## 2024-04-26 MED ORDER — ALBUTEROL SULFATE HFA 108 (90 BASE) MCG/ACT IN AERS: 2.0000 | INHALATION_SPRAY | Freq: Four times a day (QID) | RESPIRATORY_TRACT | 12 refills | Status: AC | PRN

## 2024-04-26 MED ORDER — DULOXETINE HCL 30 MG PO CPEP: 30.0000 mg | ORAL_CAPSULE | Freq: Every day | ORAL | 2 refills | Status: DC

## 2024-04-26 NOTE — Assessment & Plan Note (Signed)
 Obstructive sleep apnea Sleep study showed 52 apneic episodes per hour. - Contact cardiologist regarding CPAP machine delivery.

## 2024-04-26 NOTE — Assessment & Plan Note (Signed)
  Increased shortness of breath and fatigue. No recent cardiology follow-up. - Follow up with cardiologist. - Continue torsemide  20 mg take twice daily and spironolactone  25 mg daily, Farxiga  10 mg daily

## 2024-04-26 NOTE — Assessment & Plan Note (Signed)
 Lab Results  Component Value Date   CHOL 108 11/24/2023   HDL 36 (L) 11/24/2023   LDLCALC 60 11/24/2023   TRIG 47 11/24/2023   CHOLHDL 3.0 11/24/2023  Continue Crestor  40 mg daily, Zetia  10 mg daily Fasting lipid panel ordered  LDL goal is less than 55

## 2024-04-26 NOTE — Assessment & Plan Note (Addendum)
 Lab Results  Component Value Date   HGBA1C 7.1 (A) 11/24/2023   Type 2 diabetes mellitus with diabetic neuropathy A1c increased to 7.6. Neuropathy symptoms include numbness over toes. - Check insurance coverage for Mounjaro. - Switch from Ozempic  to Mounjaro if covered.  Will refer patient to the clinical pharmacist - Continue Farxiga  10 mg daily and glipizide  5 mg twice daily, Ozempic  2 mg once weekly. - Performed diabetic foot exams. - Educate on signs of foot injury and importance of comfortable footwear. Patient counseled on low-carb diet

## 2024-04-26 NOTE — Assessment & Plan Note (Signed)
 Wrist mass, unspecified etiology Mass on wrist for six months, increasing in size. - Order x-ray

## 2024-04-26 NOTE — Assessment & Plan Note (Signed)
  Increased shortness of breath potentially related to heart condition. - Follow up with cardiologist. - Order labs to check electrolytes and other relevant parameters.

## 2024-04-26 NOTE — Telephone Encounter (Signed)
 Patient called in about her cpap machine that she never got back in June 2025. She was given her results but cpap never ordered. I have placed the order today to [facility].  Upon patient request DME selection is [facility] Patient understands he will be contacted by [facility] to set up his cpap. Patient understands to call if [facility] does not contact him with new setup in a timely manner. Patient understands they will be called once confirmation has been received from [facility] that they have received their new machine to schedule 10 week follow up appointment.   [facility] notified of new cpap order  Please add to airview Patient was grateful for the call and thanked me.

## 2024-04-26 NOTE — Progress Notes (Addendum)
 Established Patient Office Visit  Subjective:  Patient ID: Tiffany Le, female    DOB: Apr 20, 1976  Age: 48 y.o. MRN: [id number]  CC:  Chief Complaint  Patient presents with   Diabetes    HPI   Discussed the use of AI scribe software for clinical note transcription with the patient, who gave verbal consent to proceed.  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  who presents for a follow-up visit.  She has diabetes with a recent A1c of 7.6, up from 7.1 five months ago. Her current medications include Farxiga  10 mg daily, glipizide  5 mg twice a day, and Ozempic  2 mg once a week. She experiences increased hunger and thirst over the past four months, despite adequate water  intake, and reports fatigue and exhaustion with minimal activity.  She has congestive heart failure and is on torsemide  and spironolactone . She reports increased thirst and is taking fluid pills (torsemide  and spironolactone ). She has not seen her cardiologist recently and recalls a sleep study indicating she stops breathing 52 times an hour, but has not received the ordered CPAP machine. She experiences shortness of breath, especially with activity, and reports feeling extremely exhausted. She also mentions occasional chest pain and lightheadedness when standing. She has a history of open-heart surgery and takes losartan  50 mg daily, rosuvastatin  40 mg daily, and Zetia  10 mg daily for  cholesterol management.  She reports neuropathy with numbness over her toes and cold feet when walking without socks.   She has a knot on her wrist that has grown over the past six months.   She also experiences depression and anxiety, feeling a lack of energy and understanding from family and friends. She is open to medication for these issues.  She has a history of sleep apnea, open-heart surgery, and is overdue for a mammogram and Pap smear. She is currently taking trazodone  as needed for sleep and naproxen  for pain as needed.     Assessment & Plan  General Health Maintenance Due for mammogram and Pap smear. - Order mammogram. - Refer to gynecologist for Pap smear.         Past Medical History:  Diagnosis Date   CHF (congestive heart failure) (HCC)    Coronary artery disease    CVA (cerebral vascular accident) (HCC) 09/2020   Diabetes mellitus without complication (HCC)    Diabetic neuropathy (HCC) 02/2020   Heart murmur    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   Hyperlipidemia age 2   Hypertension age 71   Hypocalcemia 11/2019   Hypokalemia 11/2019   Iron deficiency anemia    NSTEMI (non-ST elevated myocardial infarction) (HCC) 09/2020   Obesity    Proteinuria 12/09/2019   Pulmonary embolism (HCC) 09/2020    Past Surgical History:  Procedure Laterality Date   APPLICATION OF WOUND VAC N/A 12/14/2020   Procedure: APPLICATION OF WOUND VAC;  Surgeon: Dusty Sudie DEL, MD;  Location: [facility] OR;  Service: Thoracic;  Laterality: N/A;   APPLICATION OF WOUND VAC N/A 12/25/2020   Procedure: WOUND VAC CHANGE;  Surgeon: German Bartlett PEDLAR, MD;  Location: [facility]  OR;  Service: Thoracic;  Laterality: N/A;   CARDIAC CATHETERIZATION  09/25/2020   CESAREAN SECTION  3/96   CLIPPING OF ATRIAL APPENDAGE N/A 10/09/2020   Procedure: CLIPPING OF ATRIAL APPENDAGE USING ATRICURE CLIP;  Surgeon: German Bartlett PEDLAR, MD;  Location: [facility] OR;  Service: Open Heart Surgery;  Laterality: N/A;   CORONARY ARTERY BYPASS  GRAFT N/A 10/09/2020   Procedure: CORONARY ARTERY BYPASS GRAFTING (CABG), ON PUMP, TIMES FOUR, USING LEFT INTERNAL MAMMARY ARTERY AND LEFT RADIAL ARTERY (OPEN HARVEST);  Surgeon: German Bartlett PEDLAR, MD;  [facility];  Service: Open Heart Surgery;  Laterality: N/A;  POSSIBLE BIMA   INCISION AND DRAINAGE OF WOUND N/A 12/20/2020   Procedure: STERNAL WOUND DEBRIDEMENT WITH SKIN SUBSTITUTION AND ABRA;  Surgeon: Lowery Estefana RAMAN, DO;  Location: [facility] OR;  Service: Plastics;  Laterality: N/A;   IR THORACENTESIS ASP PLEURAL SPACE W/IMG GUIDE  10/16/2020   LEFT HEART CATH AND CORONARY ANGIOGRAPHY N/A 09/25/2020   Procedure: LEFT HEART CATH AND CORONARY ANGIOGRAPHY;  Surgeon: Anner Alm ORN, MD;  Location: [facility] INVASIVE CV LAB;  Service: Cardiovascular;  Laterality: N/A;   ORIF FEMUR FRACTURE     RADIAL ARTERY HARVEST Left 10/09/2020   Procedure: RADIAL ARTERY HARVEST;  Surgeon: German Bartlett PEDLAR, MD;  Location: [facility] OR;  Service: Open Heart Surgery;  Laterality: Left;   STERNAL CLOSURE N/A 12/25/2020   Procedure: STERNAL CLOSURE;  Surgeon: German Bartlett PEDLAR, MD;  Location: [facility] OR;  Service: Thoracic;  Laterality: N/A;   STERNAL WOUND DEBRIDEMENT N/A 12/14/2020   Procedure: STERNAL WOUND DEBRIDEMENT;  Surgeon: Dusty Sudie DEL, MD;  Location: [facility] OR;  Service: Thoracic;  Laterality: N/A;   STERNAL WOUND DEBRIDEMENT N/A 12/18/2020   Procedure: STERNAL WOUND DEBRIDEMENT, Wound Vac Change;  Surgeon: German Bartlett PEDLAR, MD;  Location: [facility] OR;  Service: Thoracic;  Laterality: N/A;   STERNAL WOUND DEBRIDEMENT N/A 12/25/2020   Procedure: STERNAL WOUND IRRIGATION AND DEBRIDEMENT;  Surgeon: German Bartlett PEDLAR, MD;  Location: [facility] OR;  Service: Thoracic;  Laterality: N/A;   TEE WITHOUT CARDIOVERSION N/A 10/09/2020   Procedure: TRANSESOPHAGEAL ECHOCARDIOGRAM (TEE);  Surgeon: German Bartlett PEDLAR, MD;  Location: [facility] OR;  Service: Open Heart Surgery;  Laterality: N/A;    Family History  Problem Relation Age of Onset   Heart disease Mother         CABG <50   Hypertension Mother    Diabetes Mother    Hyperlipidemia Mother    Heart attack Mother    Heart disease Father    Hyperlipidemia Father    Hypertension Father    Early death Father    Healthy Daughter    Diabetes Maternal Aunt    Cancer Maternal Grandmother        stomach cancer   Diabetes Maternal Aunt    Cancer Cousin        colon cancer (dx'd 73)    Social History   Socioeconomic History   Marital status: Single    Spouse name: Not on file   Number of children: Not on file   Years of education: 12   Highest education level: Some college, no degree  Occupational History   Occupation: Estate manager/land agent  Tobacco Use   Smoking status: Never   Smokeless tobacco: Never  Vaping Use   Vaping status: Never Used  Substance and Sexual Activity   Alcohol use: No   Drug use: No   Sexual activity: Not Currently    Partners: Male  Comment: husband currently in [place], returning 02/2013  Other Topics Concern   Not on file  Social History Narrative   Works at a call center, 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  Lives with 62 year old daughter, 1 cat Husband is living in [place] (citizen there), trying to come to [place]  (has been delayed)   Social Drivers of Health   Financial Resource Strain: Patient Declined (04/26/2024)   Overall Financial Resource Strain (CARDIA)    Difficulty of Paying Living Expenses: Patient declined  Food Insecurity: Patient Declined (04/26/2024)   Hunger Vital Sign    Worried About Running Out of Food in the Last Year: Patient declined    Ran Out of Food in the Last Year: Patient declined  Transportation Needs: Patient Declined (04/26/2024)   PRAPARE - Administrator, Civil Service (Medical): Patient declined    Lack of Transportation (Non-Medical): Patient declined  Physical Activity: Unknown (11/23/2023)   Exercise Vital Sign    Days of Exercise per Week: 0 days    Minutes of Exercise per Session: Not on file   Stress: Stress Concern Present (11/23/2023)   [employer] of Occupational Health - Occupational Stress Questionnaire    Feeling of Stress : Very much  Social Connections: Unknown (04/26/2024)   Social Connection and Isolation Panel    Frequency of Communication with Friends and Family: Patient declined    Frequency of Social Gatherings with Friends and Family: Patient declined    Attends Religious Services: Patient declined    Database administrator or Organizations: Patient declined    Attends Banker Meetings: Not on file    Marital Status: Patient declined  Intimate Partner Violence: Not At Risk (01/08/2021)   Humiliation, Afraid, Rape, and Kick questionnaire    Fear of Current or Ex-Partner: No    Emotionally Abused: No    Physically Abused: No    Sexually Abused: No    Outpatient Medications Prior to Visit  Medication Sig Dispense Refill   aspirin  EC 81 MG tablet Take 1 tablet (81 mg total) by mouth daily. 90 tablet 1   Blood Glucose Monitoring Suppl (BLOOD GLUCOSE MONITOR SYSTEM) w/Device KIT Test in the morning, at noon, and at bedtime. 1 kit 0   dapagliflozin  propanediol (FARXIGA ) 10 MG TABS tablet Take 1 tablet (10 mg total) by mouth daily. 90 tablet 3   glipiZIDE  (GLUCOTROL ) 5 MG tablet TAKE 1 TABLET(5 MG) BY MOUTH TWICE DAILY BEFORE A MEAL 180 tablet 1   Glucose Blood (BLOOD GLUCOSE TEST STRIPS) STRP Test in the morning, at noon, and at bedtime. 100 strip 0   Insulin  Pen Needle (B-D UF III MINI PEN NEEDLES) 31G X 5 MM MISC Use as directed with insulin . 300 each 3   losartan  (COZAAR ) 50 MG tablet Take 1 tablet (50 mg total) by mouth at bedtime. 90 tablet 2   methocarbamol  (ROBAXIN ) 500 MG tablet Take 1 tablet (500 mg total) by mouth every 8 (eight) hours as needed for muscle spasms. 21 tablet 0   naproxen  (NAPROSYN ) 375 MG tablet Take 1 tablet (375 mg total) by mouth 2 (two) times daily. 20 tablet 0   ondansetron  (ZOFRAN ) 4 MG tablet Take 1 tablet (4 mg  total) by mouth every 8 (eight) hours as needed for nausea or vomiting. 20 tablet 0   spironolactone  (ALDACTONE ) 25 MG tablet Take 1 tablet (25 mg total) by mouth daily. 90 tablet 2   torsemide  (DEMADEX ) 20 MG  tablet TAKE 1 TABLET(20 MG) BY MOUTH TWICE DAILY 60 tablet 3   traZODone  (DESYREL ) 50 MG tablet Take 0.5-1 tablets (25-50 mg total) by mouth at bedtime as needed for sleep. 30 tablet 3   albuterol  (PROVENTIL  HFA) 108 (90 Base) MCG/ACT inhaler Inhale 2 puffs into the lungs every 6 (six) hours as needed for wheezing. 8.5 g 12   ezetimibe  (ZETIA ) 10 MG tablet Take 1 tablet (10 mg total) by mouth daily. 90 tablet 3   nitroGLYCERIN  (NITROSTAT ) 0.4 MG SL tablet DISSOLVE 1 TABLET UNDER THE TONGUE EVERY 5 MINUTES FOR 3 DOSES AS NEEDED FOR CHEST PAIN 25 tablet 5   Semaglutide , 2 MG/DOSE, (OZEMPIC , 2 MG/DOSE,) 8 MG/3ML SOPN Inject 2 mg into the skin once a week. 3 mL 5   gabapentin  (NEURONTIN ) 300 MG capsule TAKE 1 CAPSULE(300 MG) BY MOUTH TWICE DAILY (Patient not taking: Reported on 04/26/2024) 60 capsule 2   rosuvastatin  (CRESTOR ) 20 MG tablet Take 2 tablets (40 mg total) by mouth daily. 180 tablet 3   No facility-administered medications prior to visit.    Allergies  Allergen Reactions   Oxycodone  Nausea And Vomiting and Other (See Comments)    Pt states she had dizziness   Penicillins Rash    As a baby    ROS Review of Systems  Constitutional:  Positive for fatigue. Negative for appetite change, chills and fever.  HENT:  Negative for congestion, postnasal drip, rhinorrhea and sneezing.   Respiratory:  Positive for shortness of breath. Negative for cough and wheezing.   Cardiovascular:  Negative for chest pain, palpitations and leg swelling.  Gastrointestinal:  Negative for abdominal pain, constipation, nausea and vomiting.  Genitourinary:  Negative for difficulty urinating, dysuria, flank pain and frequency.  Musculoskeletal:  Negative for arthralgias, back pain, joint swelling and  myalgias.  Skin:  Negative for color change, pallor, rash and wound.  Neurological:  Negative for facial asymmetry, weakness, numbness and headaches.  Psychiatric/Behavioral:  Positive for sleep disturbance. Negative for behavioral problems, confusion, self-injury and suicidal ideas. The patient is nervous/anxious.       Objective:    Physical Exam Vitals and nursing note reviewed.  Constitutional:      General: She is not in acute distress.    Appearance: Normal appearance. She is obese. She is not ill-appearing, toxic-appearing or diaphoretic.  HENT:     Mouth/Throat:     Mouth: Mucous membranes are moist.     Pharynx: Oropharynx is clear. No oropharyngeal exudate or posterior oropharyngeal erythema.  Eyes:     General: No scleral icterus.       Right eye: No discharge.        Left eye: No discharge.     Extraocular Movements: Extraocular movements intact.     Conjunctiva/sclera: Conjunctivae normal.  Cardiovascular:     Rate and Rhythm: Normal rate and regular rhythm.     Pulses: Normal pulses.     Heart sounds: Normal heart sounds. No murmur heard.    No friction rub. No gallop.  Pulmonary:     Effort: Pulmonary effort is normal. No respiratory distress.     Breath sounds: Normal breath sounds. No stridor. No wheezing, rhonchi or rales.  Chest:     Chest wall: No tenderness.  Abdominal:     General: There is no distension.     Palpations: Abdomen is soft.     Tenderness: There is no abdominal tenderness. There is no right CVA tenderness, left CVA tenderness or guarding.  Musculoskeletal:        General: No swelling, tenderness, deformity or signs of injury.     Right lower leg: No edema.     Left lower leg: No edema.     Comments: A fixed mass noted on the right wrist , no tenderness on palpation   Skin:    General: Skin is warm and dry.     Capillary Refill: Capillary refill takes less than 2 seconds.     Coloration: Skin is not jaundiced or pale.     Findings: No  bruising, erythema or lesion.  Neurological:     Mental Status: She is alert and oriented to person, place, and time.     Motor: No weakness.     Coordination: Coordination normal.     Gait: Gait normal.  Psychiatric:        Mood and Affect: Mood normal.        Behavior: Behavior normal.        Thought Content: Thought content normal.        Judgment: Judgment normal.     BP (!) 133/58   Pulse 81   Wt 263 lb (119.3 kg)   SpO2 100%   BMI 45.14 kg/m  Wt Readings from Last 3 Encounters:  04/26/24 263 lb (119.3 kg)  02/26/24 264 lb (119.7 kg)  11/24/23 264 lb 12.8 oz (120.1 kg)    Lab Results  Component Value Date   TSH 2.120 11/25/2022   Lab Results  Component Value Date   WBC 6.7 11/24/2023   HGB 12.2 11/24/2023   HCT 37.6 11/24/2023   MCV 86 11/24/2023   PLT 286 11/24/2023   Lab Results  Component Value Date   NA 139 11/24/2023   K 4.4 11/24/2023   CO2 23 11/24/2023   GLUCOSE 88 11/24/2023   BUN 15 11/24/2023   CREATININE 0.95 11/24/2023   BILITOT 0.3 11/24/2023   ALKPHOS 76 11/24/2023   AST 20 11/24/2023   ALT 21 11/24/2023   PROT 8.5 11/24/2023   ALBUMIN  4.5 11/24/2023   CALCIUM  9.5 11/24/2023   ANIONGAP 13 01/07/2022   EGFR 74 11/24/2023   Lab Results  Component Value Date   CHOL 108 11/24/2023   Lab Results  Component Value Date   HDL 36 (L) 11/24/2023   Lab Results  Component Value Date   LDLCALC 60 11/24/2023   Lab Results  Component Value Date   TRIG 47 11/24/2023   Lab Results  Component Value Date   CHOLHDL 3.0 11/24/2023   Lab Results  Component Value Date   HGBA1C 7.6 (A) 04/26/2024      Assessment & Plan:   Problem List Items Addressed This Visit       Cardiovascular and Mediastinum   Essential hypertension, benign (Chronic)   BP Readings from Last 3 Encounters:  04/26/24 (!) 133/58  02/26/24 125/81  12/01/23 109/78   Blood pressure 133/58, systolic slightly above target for diabetes. - Continue losartan  50 mg  daily, torsemide  20 mg twice daily, spironolactone  25 mg daily        Relevant Medications   ezetimibe  (ZETIA ) 10 MG tablet   nitroGLYCERIN  (NITROSTAT ) 0.4 MG SL tablet   rosuvastatin  (CRESTOR ) 20 MG tablet   Coronary artery disease involving native coronary artery of native heart with unstable angina pectoris (HCC)   Relevant Medications   ezetimibe  (ZETIA ) 10 MG tablet   nitroGLYCERIN  (NITROSTAT ) 0.4 MG SL tablet   rosuvastatin  (CRESTOR ) 20 MG tablet  Chronic diastolic heart failure (HCC)    Increased shortness of breath and fatigue. No recent cardiology follow-up. - Follow up with cardiologist. - Continue torsemide  20 mg take twice daily and spironolactone  25 mg daily, Farxiga  10 mg daily        Relevant Medications   ezetimibe  (ZETIA ) 10 MG tablet   nitroGLYCERIN  (NITROSTAT ) 0.4 MG SL tablet   rosuvastatin  (CRESTOR ) 20 MG tablet   Other Relevant Orders   CBC   CMP14+EGFR     Respiratory   OSA (obstructive sleep apnea)   Obstructive sleep apnea Sleep study showed 52 apneic episodes per hour. - Contact cardiologist regarding CPAP machine delivery.         Endocrine   Poorly controlled type 2 diabetes mellitus with neuropathy (HCC)   Lab Results  Component Value Date   HGBA1C 7.1 (A) 11/24/2023   Type 2 diabetes mellitus with diabetic neuropathy A1c increased to 7.6. Neuropathy symptoms include numbness over toes. - Check insurance coverage for Mounjaro. - Switch from Ozempic  to Mounjaro if covered.  Will refer patient to the clinical pharmacist - Continue Farxiga  10 mg daily and glipizide  5 mg twice daily, Ozempic  2 mg once weekly. - Performed diabetic foot exams. - Educate on signs of foot injury and importance of comfortable footwear. Patient counseled on low-carb diet        Relevant Medications   rosuvastatin  (CRESTOR ) 20 MG tablet   Semaglutide , 2 MG/DOSE, (OZEMPIC , 2 MG/DOSE,) 8 MG/3ML SOPN   Other Relevant Orders   AMB Referral VBCI Care  Management   CBC   CMP14+EGFR   Microalbumin / creatinine urine ratio   POCT glycosylated hemoglobin (Hb A1C) (Completed)     Other   Shortness of breath - Primary    Increased shortness of breath potentially related to heart condition. - Follow up with cardiologist. - Order labs to check electrolytes and other relevant parameters.       Relevant Medications   albuterol  (PROVENTIL  HFA) 108 (90 Base) MCG/ACT inhaler   Dyslipidemia   Lab Results  Component Value Date   CHOL 108 11/24/2023   HDL 36 (L) 11/24/2023   LDLCALC 60 11/24/2023   TRIG 47 11/24/2023   CHOLHDL 3.0 11/24/2023  Continue Crestor  40 mg daily, Zetia  10 mg daily Fasting lipid panel ordered  LDL goal is less than 55      Relevant Medications   ezetimibe  (ZETIA ) 10 MG tablet   rosuvastatin  (CRESTOR ) 20 MG tablet   Other Relevant Orders   Lipid panel   Paresthesia   Checking B12 Lab Results  Component Value Date   VITAMINB12 225 (L) 08/20/2021         Relevant Orders   Vitamin B12   Mass of right wrist   Wrist mass, unspecified etiology Mass on wrist for six months, increasing in size. - Order x-ray       Relevant Orders   DG Hand Complete Right   Anxiety and depression      04/26/2024    2:28 PM 11/24/2023   10:51 AM 03/25/2023    3:37 PM  Depression screen PHQ 2/9  Decreased Interest 3 0 0  Down, Depressed, Hopeless 3 0 0  PHQ - 2 Score 6 0 0  Altered sleeping 3    Tired, decreased energy 3    Change in appetite 0    Feeling bad or failure about yourself  0    Trouble concentrating 1    Moving slowly or fidgety/restless 3  Suicidal thoughts 0    PHQ-9 Score 16    Difficult doing work/chores Somewhat difficult         04/26/2024    2:31 PM  GAD 7 : Generalized Anxiety Score  Nervous, Anxious, on Edge 3  Control/stop worrying 0  Worry too much - different things 0  Trouble relaxing 1  Restless 0  Easily annoyed or irritable 3  Afraid - awful might happen 2  Total GAD 7  Score 9  Anxiety Difficulty Not difficult at all  Depression and anxiety disorder  - Start duloxetine  30 mg daily, increase to 60 mg after one week if tolerated. - Follow up in 4-6 weeks to assess response to medication.       Relevant Medications   DULoxetine  (CYMBALTA ) 30 MG capsule   Fatigue   Relevant Orders   CBC   TSH   Other Visit Diagnoses       Screening mammogram for breast cancer       Relevant Orders   MM 3D SCREENING MAMMOGRAM BILATERAL BREAST     Screening for cervical cancer       Relevant Orders   Ambulatory referral to Gynecology       Meds ordered this encounter  Medications   albuterol  (PROVENTIL  HFA) 108 (90 Base) MCG/ACT inhaler    Sig: Inhale 2 puffs into the lungs every 6 (six) hours as needed for wheezing.    Dispense:  8.5 g    Refill:  12   ezetimibe  (ZETIA ) 10 MG tablet    Sig: Take 1 tablet (10 mg total) by mouth daily.    Dispense:  90 tablet    Refill:  3   nitroGLYCERIN  (NITROSTAT ) 0.4 MG SL tablet    Sig: DISSOLVE 1 TABLET UNDER THE TONGUE EVERY 5 MINUTES FOR 3 DOSES AS NEEDED FOR CHEST PAIN    Dispense:  25 tablet    Refill:  5   rosuvastatin  (CRESTOR ) 20 MG tablet    Sig: Take 2 tablets (40 mg total) by mouth daily.    Dispense:  180 tablet    Refill:  3   Semaglutide , 2 MG/DOSE, (OZEMPIC , 2 MG/DOSE,) 8 MG/3ML SOPN    Sig: Inject 2 mg into the skin once a week.    Dispense:  3 mL    Refill:  0   DULoxetine  (CYMBALTA ) 30 MG capsule    Sig: Take 1 capsule (30 mg total) by mouth daily.    Dispense:  60 capsule    Refill:  2    Follow-up: Return in about 6 weeks (around 06/07/2024) for DM, ANXIETY, DEPRESSION.    Marlowe Lawes R Paisley Grajeda, FNP

## 2024-04-26 NOTE — Patient Instructions (Signed)
 Goal for fasting blood sugar ranges from 80 to 120 and 2 hours after any meal or at bedtime should be between 130 to 170.   1. Shortness of breath  - albuterol  (PROVENTIL  HFA) 108 (90 Base) MCG/ACT inhaler; Inhale 2 puffs into the lungs every 6 (six) hours as needed for wheezing.  Dispense: 8.5 g; Refill: 12  2. Coronary artery disease involving native coronary artery of native heart with unstable angina pectoris (HCC) (Primary)   3. 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 Acute on chronic combined systolic (congestive) and diastolic (congestive) heart failure (HCC)  - CBC - CMP14+EGFR  5. Type 2 diabetes mellitus with complication, with long-term current use of insulin  (HCC)  - Semaglutide , 2 MG/DOSE, (OZEMPIC , 2 MG/DOSE,) 8 MG/3ML SOPN; Inject 2 mg into the skin once a week.  Dispense: 3 mL; Refill: 0 - AMB Referral VBCI Care Management - CBC - CMP14+EGFR - Microalbumin / creatinine urine ratio  6. Dyslipidemia  - ezetimibe  (ZETIA ) 10 MG tablet; Take 1 tablet (10 mg total) by mouth daily.  Dispense: 90 tablet; Refill: 3 - rosuvastatin  (CRESTOR ) 20 MG tablet; Take 2 tablets (40 mg total) by mouth daily.  Dispense: 180 tablet; Refill: 3 - Lipid panel  7. Paresthesia  - Vitamin B12    It is important that you exercise regularly at least 30 minutes 5 times a week as tolerated  Think about what you will eat, plan ahead. Choose  clean, green, fresh or frozen over canned, processed or packaged foods which are more sugary, salty and fatty. 70 to 75% of food eaten should be vegetables and fruit. Three meals at set times with snacks allowed between meals, but they must be fruit or vegetables. Aim to eat over a 12 hour period , example 7 am to 7 pm, and STOP after  your last meal of the day. Drink water ,generally about 64 ounces per day, no other drink is as healthy. Fruit juice is best enjoyed in a healthy way, by EATING the  fruit.  Thanks for choosing [facility] we consider it a privelige to serve you.

## 2024-04-26 NOTE — Assessment & Plan Note (Signed)
    04/26/2024    2:28 PM 11/24/2023   10:51 AM 03/25/2023    3:37 PM  Depression screen PHQ 2/9  Decreased Interest 3 0 0  Down, Depressed, Hopeless 3 0 0  PHQ - 2 Score 6 0 0  Altered sleeping 3    Tired, decreased energy 3    Change in appetite 0    Feeling bad or failure about yourself  0    Trouble concentrating 1    Moving slowly or fidgety/restless 3    Suicidal thoughts 0    PHQ-9 Score 16    Difficult doing work/chores Somewhat difficult         04/26/2024    2:31 PM  GAD 7 : Generalized Anxiety Score  Nervous, Anxious, on Edge 3  Control/stop worrying 0  Worry too much - different things 0  Trouble relaxing 1  Restless 0  Easily annoyed or irritable 3  Afraid - awful might happen 2  Total GAD 7 Score 9  Anxiety Difficulty Not difficult at all  Depression and anxiety disorder  - Start duloxetine  30 mg daily, increase to 60 mg after one week if tolerated. - Follow up in 4-6 weeks to assess response to medication.

## 2024-04-26 NOTE — Addendum Note (Signed)
 Addended by: VICTORY IHA on: 04/26/2024 04:18 PM   Modules accepted: Orders

## 2024-04-26 NOTE — Assessment & Plan Note (Addendum)
 Checking B12 Lab Results  Component Value Date   VITAMINB12 225 (L) 08/20/2021

## 2024-04-26 NOTE — Assessment & Plan Note (Signed)
 BP Readings from Last 3 Encounters:  04/26/24 (!) 133/58  02/26/24 125/81  12/01/23 109/78   Blood pressure 133/58, systolic slightly above target for diabetes. - Continue losartan  50 mg daily, torsemide  20 mg twice daily, spironolactone  25 mg daily

## 2024-04-29 ENCOUNTER — Telehealth: Payer: Self-pay | Admitting: Nurse Practitioner

## 2024-04-29 ENCOUNTER — Telehealth: Payer: Self-pay

## 2024-04-29 NOTE — Telephone Encounter (Signed)
 Copied from CRM [id number]. Topic: General - Other >> Apr 29, 2024  1:55 PM Tiffany Le wrote: Reason for CRM: patient returning call from Tiffany Le, Tiffany Le and has questions about some medication Provided Tiffany Le, Tiffany Le phone [phone]  Patient would like this medication DULoxetine  (CYMBALTA ) 30 MG capsule changed to  Drizalma same dosage. Patient is unable to swallow capsule

## 2024-04-29 NOTE — Progress Notes (Signed)
 Care Guide Pharmacy Note  04/29/2024 Name: Tiffany Le MRN: [id number] DOB: [birth year]  Referred By: Paseda, Folashade R, FNP Reason for referral: Complex Care Management (Outreach to schedule with Pharm d )   Tiffany Le is a 48 y.o. year old female who is a primary care patient of Paseda, Folashade R, FNP.  Tiffany Le was referred to the pharmacist for assistance related to: DMII  An unsuccessful telephone outreach was attempted today to contact the patient who was referred to the pharmacy team for assistance with medication management. Additional attempts will be made to contact the patient.  Jeoffrey Buffalo , RMA     [facility]  [facility], [facility] Guide  Direct Dial: [phone]  Website: [email or web address]

## 2024-05-02 NOTE — Progress Notes (Signed)
 Care Guide Pharmacy Note  05/02/2024 Name: Tiffany Le MRN: [id number] DOB: 1976/06/01  Referred By: Paseda, Folashade R, FNP Reason for referral: Complex Care Management (Outreach to schedule with Pharm d )   Tiffany Le is a 48 y.o. year old female who is a primary care patient of Paseda, Folashade R, FNP.  [name] was referred to the pharmacist for assistance related to: DMII  A second unsuccessful telephone outreach was attempted today to contact the patient who was referred to the pharmacy team for assistance with medication management. Additional attempts will be made to contact the patient.  Jeoffrey Buffalo , RMA     [facility]  [facility], [facility] Guide  Direct Dial: [phone]  Website: [email or web address]

## 2024-05-02 NOTE — Progress Notes (Signed)
 Care Guide Pharmacy Note  05/02/2024 Name: Tiffany Le MRN: [id number] DOB: Dec 11, 1975  Referred By: Paseda, Folashade R, FNP Reason for referral: Complex Care Management (Outreach to schedule with Pharm d )   Tiffany Le is a 48 y.o. year old female who is a primary care patient of Paseda, Folashade R, FNP.  Tiffany Le was referred to the pharmacist for assistance related to: DMII  Successful contact was made with the patient to discuss pharmacy services including being ready for the pharmacist to call at least 5 minutes before the scheduled appointment time and to have medication bottles and any blood pressure readings ready for review. The patient agreed to meet with the pharmacist via telephone visit on (date/time).06/14/2024  Jeoffrey Buffalo , RMA     [place]  [facility], [facility] Guide  Direct Dial: [phone]  Website: [email or web address]

## 2024-05-13 ENCOUNTER — Ambulatory Visit

## 2024-05-17 ENCOUNTER — Ambulatory Visit

## 2024-05-24 ENCOUNTER — Telehealth (HOSPITAL_BASED_OUTPATIENT_CLINIC_OR_DEPARTMENT_OTHER): Payer: Self-pay | Admitting: *Deleted

## 2024-05-24 ENCOUNTER — Other Ambulatory Visit: Payer: Self-pay | Admitting: Nurse Practitioner

## 2024-05-24 DIAGNOSIS — I10 Essential (primary) hypertension: Secondary | ICD-10-CM

## 2024-05-24 NOTE — Telephone Encounter (Signed)
 This is not our patient please reroute to correct office    Copied from CRM [id number]. Topic: General - Call Back - No Documentation >> May 24, 2024  9:27 AM Tiffany Le wrote: Reason for CRM: Patient states missed a call from the office but no notes and states there was no VM left. Patient would like a callback to discuss.  Patient can be reached at [phone]

## 2024-06-01 ENCOUNTER — Ambulatory Visit: Payer: Self-pay

## 2024-06-01 NOTE — Telephone Encounter (Signed)
 FYI Only or Action Required?: FYI only for provider.  Patient was last seen in primary care on 04/26/2024 by Paseda, Folashade R, FNP.  Called Nurse Triage reporting Rash.  Symptoms began 2 weeks ago.  Symptoms are: unchanged.  Triage Disposition: See PCP When Office is Open (Within 3 Days)  Patient/caregiver understands and will follow disposition?: No appointment, going to mobile medicine unit or urgent care      Copied from CRM #[id number]. Topic: Clinical - Red Word Triage >> Jun 01, 2024  1:17 PM Lauren C wrote: Red Word that prompted transfer to Nurse Triage: Pt has been having allergic reaction on and off for 2 weeks, rash on hands and arms, unsure what is causing it. Does not know if she needs appointment.      Reason for Disposition  Mild widespread rash  (Exception: Heat rash lasting 3 days or less.)  Answer Assessment - Initial Assessment Questions Patient will go to the mobile medicine unit or urgent care for evaluation.    1. APPEARANCE of RASH: What does the rash look like? (e.g., blisters, dry flaky skin, red spots, redness, sores)     Round red spots 2. SIZE: How big are the spots? (e.g., tip of pen, eraser, coin; inches, centimeters)     Various sizes, from tip of pen to about a penny  3. LOCATION: Where is the rash located?     Arms, hands, and fever  4. COLOR: What color is the rash? (Note: It is difficult to assess rash color in people with darker-colored skin. When this situation occurs, simply ask the caller to describe what they see.)     Red 5. ONSET: When did the rash begin?     2 weeks ago  6. FEVER: Do you have a fever? If Yes, ask: What is your temperature, how was it measured, and when did it start?     No 7. ITCHING: Does the rash itch? If Yes, ask: How bad is the itch? (Scale 1-10; or mild, moderate, severe)     Moderate to severe  8. CAUSE: What do you think is causing the rash?     Unsure  9. MEDICINE FACTORS: Have  you started any new medicines within the last 2 weeks? (e.g., antibiotics)      No 10. OTHER SYMPTOMS: Do you have any other symptoms? (e.g., dizziness, headache, sore throat, joint pain)       Nausea today  Protocols used: Rash or Redness - [facility]

## 2024-06-02 ENCOUNTER — Ambulatory Visit

## 2024-06-02 ENCOUNTER — Ambulatory Visit: Payer: Self-pay

## 2024-06-03 ENCOUNTER — Emergency Department (HOSPITAL_BASED_OUTPATIENT_CLINIC_OR_DEPARTMENT_OTHER)
Admission: EM | Admit: 2024-06-03 | Discharge: 2024-06-03 | Disposition: A | Discharge status: Home / Self Care | Attending: Emergency Medicine | Admitting: Emergency Medicine

## 2024-06-03 ENCOUNTER — Ambulatory Visit: Payer: Self-pay

## 2024-06-03 ENCOUNTER — Other Ambulatory Visit: Payer: Self-pay

## 2024-06-03 ENCOUNTER — Encounter (HOSPITAL_BASED_OUTPATIENT_CLINIC_OR_DEPARTMENT_OTHER): Payer: Self-pay | Admitting: Emergency Medicine

## 2024-06-03 DIAGNOSIS — Z951 Presence of aortocoronary bypass graft: Secondary | ICD-10-CM | POA: Insufficient documentation

## 2024-06-03 DIAGNOSIS — Z79899 Other long term (current) drug therapy: Secondary | ICD-10-CM | POA: Insufficient documentation

## 2024-06-03 DIAGNOSIS — I509 Heart failure, unspecified: Secondary | ICD-10-CM | POA: Diagnosis not present

## 2024-06-03 DIAGNOSIS — Z7984 Long term (current) use of oral hypoglycemic drugs: Secondary | ICD-10-CM | POA: Insufficient documentation

## 2024-06-03 DIAGNOSIS — Z794 Long term (current) use of insulin: Secondary | ICD-10-CM | POA: Diagnosis not present

## 2024-06-03 DIAGNOSIS — R21 Rash and other nonspecific skin eruption: Secondary | ICD-10-CM | POA: Diagnosis present

## 2024-06-03 DIAGNOSIS — Z8673 Personal history of transient ischemic attack (TIA), and cerebral infarction without residual deficits: Secondary | ICD-10-CM | POA: Insufficient documentation

## 2024-06-03 DIAGNOSIS — E119 Type 2 diabetes mellitus without complications: Secondary | ICD-10-CM | POA: Insufficient documentation

## 2024-06-03 DIAGNOSIS — I110 Hypertensive heart disease with heart failure: Secondary | ICD-10-CM | POA: Diagnosis not present

## 2024-06-03 DIAGNOSIS — Z7982 Long term (current) use of aspirin: Secondary | ICD-10-CM | POA: Insufficient documentation

## 2024-06-03 DIAGNOSIS — I2510 Atherosclerotic heart disease of native coronary artery without angina pectoris: Secondary | ICD-10-CM | POA: Diagnosis not present

## 2024-06-03 DIAGNOSIS — L509 Urticaria, unspecified: Secondary | ICD-10-CM | POA: Insufficient documentation

## 2024-06-03 MED ORDER — TRIAMCINOLONE ACETONIDE 0.1 % EX CREA: 1.0000 | TOPICAL_CREAM | Freq: Two times a day (BID) | CUTANEOUS | 0 refills | Status: DC

## 2024-06-03 NOTE — ED Triage Notes (Signed)
 Rash on arms Starting 2 weeks ago Flares up went at boyfriends house

## 2024-06-03 NOTE — ED Provider Notes (Signed)
 [place] EMERGENCY DEPARTMENT AT [facility] Provider Note   CSN: [id number] Arrival date & time: 06/03/24  1410     Patient presents with: Rash  HPI Tiffany Le is a 48 y.o. female presenting for rash.  It started 2 weeks ago. Located on both arms. She states whenever she goes to her boyfriend's house it flares up.  It is itchy.  Has been using Benadryl  that has helped marginally.  Denies any lesions in the mouth, palms of the hand or soles of the feet.  Denies fever.  Denies any trouble breathing or throat closing sensation.  She states overall the rash is improved but it has been persistent prompting her to be evaluated today.  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 Authorizing Provider  triamcinolone  cream (KENALOG ) 0.1 % Apply 1 Application topically 2 (two) times daily. 06/03/24  Yes Lang Norleen POUR, PA-C  albuterol  (PROVENTIL  HFA) 108 (90 Base) MCG/ACT inhaler Inhale 2 puffs into the lungs every 6 (six) hours as needed for wheezing. 04/26/24   Paseda, Folashade R, FNP  aspirin  EC 81 MG tablet Take 1 tablet (81 mg total) by mouth daily. 11/24/23   Oley Bascom RAMAN, NP  Blood Glucose Monitoring Suppl (BLOOD GLUCOSE MONITOR SYSTEM) w/Device KIT Test in the morning, at noon, and at bedtime. 03/26/23   Paseda, Folashade R, FNP  dapagliflozin  propanediol (FARXIGA ) 10 MG TABS tablet  Take 1 tablet (10 mg total) by mouth daily. 11/24/23   Oley Bascom RAMAN, NP  DULoxetine  (CYMBALTA ) 30 MG capsule Take 1 capsule (30 mg total) by mouth daily. 04/26/24   Paseda, Folashade R, FNP  ezetimibe  (ZETIA ) 10 MG tablet Take 1 tablet (10 mg total) by mouth daily. 04/26/24   Paseda, Folashade R, FNP  glipiZIDE  (GLUCOTROL ) 5 MG tablet TAKE 1 TABLET(5 MG) BY MOUTH TWICE DAILY BEFORE A MEAL 03/01/24   Nichols, Tonya S, NP  Glucose Blood (BLOOD GLUCOSE TEST STRIPS) STRP Test in the morning, at noon, and at bedtime. 03/26/23   Paseda, Folashade R, FNP  Insulin  Pen Needle (B-D UF III MINI PEN NEEDLES) 31G X 5 MM MISC Use as directed with insulin . 05/15/22   Oley Bascom RAMAN, NP  losartan  (COZAAR ) 50 MG tablet Take 1 tablet (50 mg total) by mouth at bedtime. 11/24/23   Oley Bascom RAMAN, NP  methocarbamol  (ROBAXIN ) 500 MG tablet Take 1 tablet (500 mg total) by mouth every 8 (eight) hours as needed for muscle spasms. 08/14/22   Oley Bascom RAMAN, NP  naproxen  (NAPROSYN ) 375 MG tablet Take 1 tablet (375 mg total) by mouth 2 (two) times daily. 02/26/24   Arloa Suzen RAMAN, NP  nitroGLYCERIN  (NITROSTAT ) 0.4 MG SL tablet DISSOLVE 1 TABLET UNDER THE TONGUE EVERY 5 MINUTES FOR 3 DOSES AS NEEDED FOR CHEST PAIN 04/26/24  Paseda, Folashade R, FNP  ondansetron  (ZOFRAN ) 4 MG tablet Take 1 tablet (4 mg total) by mouth every 8 (eight) hours as needed for nausea or vomiting. 03/30/24   Oley Bascom RAMAN, NP  rosuvastatin  (CRESTOR ) 20 MG tablet Take 2 tablets (40 mg total) by mouth daily. 04/26/24 07/25/24  Paseda, Folashade R, FNP  Semaglutide , 2 MG/DOSE, (OZEMPIC , 2 MG/DOSE,) 8 MG/3ML SOPN Inject 2 mg into the skin once a week. 04/26/24   Paseda, Folashade R, FNP  spironolactone  (ALDACTONE ) 25 MG tablet Take 1 tablet (25 mg total) by mouth daily. 11/24/23   Oley Bascom RAMAN, NP  torsemide  (DEMADEX ) 20 MG tablet TAKE 1 TABLET(20 MG) BY MOUTH TWICE DAILY 05/24/24   Paseda, Folashade R, FNP  traZODone  (DESYREL ) 50 MG tablet Take 0.5-1  tablets (25-50 mg total) by mouth at bedtime as needed for sleep. 11/24/23   Oley Bascom RAMAN, NP    Allergies: Oxycodone  and Penicillins    Review of Systems  Skin:  Positive for rash.    Updated Vital Signs BP (!) 140/76 (BP Location: Right Arm)   Pulse 82   Temp 98.1 F (36.7 C) (Oral)   Resp 18   LMP 05/11/2024 (Approximate)   SpO2 100%   Physical Exam Constitutional:      Appearance: Normal appearance.  HENT:     Head: Normocephalic.     Comments: No oral lesions    Nose: Nose normal.  Eyes:     Conjunctiva/sclera: Conjunctivae normal.  Pulmonary:     Effort: Pulmonary effort is normal.  Skin:    Comments: Minimal urticarial type rash noticed to both arms.  Neurological:     Mental Status: She is alert.  Psychiatric:        Mood and Affect: Mood normal.     (all labs ordered are listed, but only abnormal results are displayed) Labs Reviewed - No data to display  EKG: None  Radiology: No results found.   Procedures   Medications Ordered in the ED - No data to display                                  Medical Decision Making  48 year old well-appearing female presenting for rash.  Exam notable for what appears to be a mild urticarial type rash noted on both arms.  No lesions noted to the mouth, palms of the hands or soles of the feet.  Sent steroid cream to her pharmacy.  Also she could continue taking the Benadryl  as needed for the itching.  She reports she does have a PCP follow-up appointment on Tuesday.  Advised her to go to that appointment for reevaluation.  Discussed pertinent return precautions.  Discharged.     Final diagnoses:  Rash    ED Discharge Orders          Ordered    triamcinolone  cream (KENALOG ) 0.1 %  2 times daily        06/03/24 1500               Lang Norleen MARLA DEVONNA 06/03/24 1501    Doretha Folks, MD 06/13/24 1251

## 2024-06-03 NOTE — Telephone Encounter (Signed)
 FYI Only or Action Required?: FYI only for provider.  Patient was last seen in primary care on 04/26/2024 by Paseda, Folashade R, FNP.  Called Nurse Triage reporting Rash.  Symptoms began several weeks ago.  Symptoms are: gradually worsening.  Triage Disposition: See PCP When Office is Open (Within 3 Days)  Patient/caregiver understands and will follow disposition?: No appointment, going to urgent care      Copied from CRM #[id number]. Topic: Clinical - Red Word Triage >> Jun 03, 2024 12:52 PM Delon HERO wrote: Red Word that prompted transfer to Nurse Triage: Patient is calling to report rash/hives for 1.5 week on right arm. Today rash/hives on the left arm. Diarrhea started last night. Nauseous 2 day. Feels warm.       Reason for Disposition  Mild widespread rash  (Exception: Heat rash lasting 3 days or less.)  Answer Assessment - Initial Assessment Questions 1. APPEARANCE of RASH: What does the rash look like? (e.g., blisters, dry flaky skin, red spots, redness, sores) Round red spots 2. SIZE: How big are the spots? (e.g., tip of pen, eraser, coin; inches, centimeters) Various sizes, from tip of pen to about a penny  3. LOCATION: Where is the rash located? Arms, hands, and fever  4. COLOR: What color is the rash? (Note: It is difficult to assess rash color in people with darker-colored skin. When this situation occurs, simply ask the caller to describe what they see.) Red 5. ONSET: When did the rash begin? 2 weeks ago  6. FEVER: Do you have a fever? If Yes, ask: What is your temperature, how was it measured, and when did it start? No 7. ITCHING: Does the rash itch? If Yes, ask: How bad is the itch? (Scale 1-10; or mild, moderate, severe) Moderate to severe  8. CAUSE: What do you think is causing the rash? Unsure  9. MEDICINE FACTORS: Have you started any new medicines within the last 2 weeks? (e.g., antibiotics)  No 10. OTHER SYMPTOMS: Do you have  any other symptoms? (e.g., dizziness, headache, sore throat, joint pain) Nausea, some diarrhea, some fatigue  Protocols used: Rash or Redness - [facility]

## 2024-06-03 NOTE — Discharge Instructions (Addendum)
 Eval patient for your rash was overall reassuring.  I sent some steroid cream for treatment to your pharmacy.  As we discussed please follow-up with your PCP.  You can also take Benadryl  as needed for the itching.  If you have any trouble breathing, start to wheeze or have throat closing sensation or scratchy throat please return to the ED for further evaluation.

## 2024-06-07 ENCOUNTER — Ambulatory Visit: Payer: Self-pay | Admitting: Nurse Practitioner

## 2024-06-13 ENCOUNTER — Telehealth: Payer: Self-pay

## 2024-06-13 NOTE — Telephone Encounter (Signed)
 [facility] rx refill: dapagliflozin  propanediol (FARXIGA ) 10 MG TABS tablet [[id number]]

## 2024-06-14 ENCOUNTER — Other Ambulatory Visit: Payer: Self-pay

## 2024-06-14 ENCOUNTER — Telehealth: Payer: Self-pay

## 2024-06-14 NOTE — Telephone Encounter (Signed)
 Attempted to contact patient for scheduled appointment for medication management. Left HIPAA compliant message for patient to return my call at their convenience.   Patient attempted to return call to [facility]. I called her back at 11AM and was unsuccessful again. Will outreach to reschedule.   Lorain Baseman, PharmD [facility] [phone]

## 2024-06-14 NOTE — Progress Notes (Deleted)
 06/14/2024 Name: Tiffany Le MRN: [id number] DOB: 03-12-1976  No chief complaint on file.   Tiffany Le is a 48 y.o. year old female who presented for a telephone visit.   They were referred to the pharmacist by their PCP for assistance in managing diabetes. Tiffany Le PMH includes HTN, NSTEMI, CAD s/p 4vCABG (2022), CHF (EF 55-60% in June 2024, improved from 35-40% in 2022), CVA (2022), OSA, T2DM, BMI > 30, HLD.   Subjective: Patient was last seen by PCP, Tiffany Shall, NP, on 04/26/24. At last visit, BP was 133/58 mmHg. A1C was up to 7.6% from 7.1%. She reported ShOB with activity and fatigue. Occasional CP and lightheadedness when standing.  Today, patient presents in *** good spirits and presents without *** any assistance. ***Patient is accompanied by ***.   Mild sleep apnea, will not qualify for [facility]   Care Team: Primary Care Provider: Paseda, Folashade R, FNP ; Next Scheduled Visit: 06/21/24 Cardiologist: Tiffany Ferrier, PA; Next Scheduled Visit: 06/17/24  Medication Access/Adherence  Current Pharmacy:  [id number] - [place], [place] - [address] AT [facility] OF [address] [name] [address] Phone: [phone] Fax: [phone]   Patient reports affordability concerns with their medications: {YES/NO:21197} Patient reports access/transportation concerns to their pharmacy: {YES/NO:21197} Patient reports adherence concerns with their medications:  {YES/NO:21197} ***  *** Patient denies adherence with medications, reports missing *** medications *** times per week, on average.   Diabetes:  Current medications: glipizide  5 mg BID, Farxiga  10 mg daily, Ozempic  2 mg weekly Medications tried in the past: Lantus   Current glucose readings: ***  Date of Download: *** % Time CGM is active: ***% Average Glucose: *** mg/dL Glucose Management Indicator: ***  Glucose Variability: *** (goal <36%) Time in  Goal:  - Time in range 70-180: ***% - Time above range: ***% - Time below range: ***% Observed patterns:  Patient {Actions; denies-reports:120008} hypoglycemic s/sx including ***dizziness, shakiness, sweating. Patient {Actions; denies-reports:120008} hyperglycemic symptoms including ***polyuria, polydipsia, polyphagia, nocturia, neuropathy, blurred vision. She reports neuropathy with numbness over her toes and cold feet when walking without socks.   Current meal patterns:  - Breakfast: *** - Lunch *** - Supper *** - Snacks *** - Drinks ***  Current physical activity: ***  Current medication access support: ***  Hyperlipidemia/ASCVD Risk Reduction  Current lipid lowering medications: rosuvastatin  20 mg daily, ezetimibe  10 mg daily Medications tried in the past:   Antiplatelet regimen: aspirin  81 mg daily  ASCVD History:  Family History:  Risk Factors:   Current physical activity: ***  Current medication access support: ***  Heart Failure (EF ***%):  Current medications:  ACEi/ARB/ARNI: losartan  50 mg daily SGLT2i: Farxiga  10 mg daily Beta blocker: none Mineralocorticoid Receptor Antagonist: spironolactone  25 mg daily Diuretic regimen: torsemide  20 mg BID  Current home blood pressure readings: *** Current home weights: ***  Patient {Actions; denies-reports:120008} volume overload signs or symptoms including ***shortness of breath, lower extremity edema, increased use of pillows at night   Current physical activity: ***  Current medication access support: ***   Objective:  BP Readings from Last 3 Encounters:  06/03/24 (!) 140/76  04/26/24 (!) 133/58  02/26/24 125/81    Lab Results  Component Value Date   HGBA1C 7.6 (A) 04/26/2024   HGBA1C 7.1 (A) 11/24/2023   HGBA1C 6.9 (A) 07/31/2023       Latest Ref Rng & Units 11/24/2023   11:15 AM 03/25/2023  4:10 PM 11/25/2022   11:26 AM  BMP  Glucose 70 - 99 mg/dL 88  93  81   BUN 6 - 24 mg/dL 15  13  15     Creatinine 0.57 - 1.00 mg/dL 9.04  9.18  9.15   BUN/Creat Ratio 9 - 23 16  16  18    Sodium 134 - 144 mmol/L 139  140  141   Potassium 3.5 - 5.2 mmol/L 4.4  4.1  4.0   Chloride 96 - 106 mmol/L 101  100  101   CO2 20 - 29 mmol/L 23  27  25    Calcium  8.7 - 10.2 mg/dL 9.5  8.8  9.7     Lab Results  Component Value Date   CHOL 108 11/24/2023   HDL 36 (L) 11/24/2023   LDLCALC 60 11/24/2023   TRIG 47 11/24/2023   CHOLHDL 3.0 11/24/2023    Medications Reviewed Today   Medications were not reviewed in this encounter       Assessment/Plan:   Diabetes: - Currently {CHL Controlled/[phone]} with most recent A1C of *** {Desc; above/below:16086} goal {CCM A1c HNJOD:[id number]}. Medication adherence appears ***. Control is suboptimal due to ***.   - Last UACR Aug 2024 - 59 mg/g - Patient denies personal or family history of multiple endocrine neoplasia type 2, medullary thyroid cancer; personal history of pancreatitis or gallbladder disease.*** - Reviewed long term cardiovascular and renal outcomes of uncontrolled blood sugar - Reviewed goal A1c, goal fasting, and goal 2 hour post prandial glucose - Reviewed hypoglycemia management plan and the rule of 15*** - Reviewed dietary modifications including *** utilizing the healthy plate method, limiting portion size of carbohydrate foods, increasing intake of protein and non-starchy vegetables. Counseled patient to stay hydrated with water  throughout the day. - Reviewed lifestyle modifications including: aiming for 150 minutes of moderate intensity exercise every week. *** - Recommend to ***  - Recommend to check glucose twice daily: fasting and 2-hr PPG ***. Counseled patient to bring glucometer or BG log to every appointment. - Next A1C due ***    Hyperlipidemia/ASCVD Risk Reduction: - Currently {CHL Controlled/[phone]} with most recent LDL-C of *** {Desc; above/below:16086} goal {LDL Goals:32982} given ***. ***  intensity statin indicated. - Reviewed long term complications of uncontrolled cholesterol - Reviewed lifestyle recommendations to lower LDL-C including regular physical activity, 5-10% weight loss, eating a diet low in saturated fat, and increasing intake of fiber to at least 25 g per day. - Reviewed lifestyle recommendations to lower TG including following a low-carb diet, restricting alcohol intake, and increasing physical activity and exercise - Recommend to ***   Heart Failure: - Currently {managed:26422} - Reviewed appropriate blood pressure monitoring technique and reviewed goal blood pressure - Reviewed to weigh daily and when to contact cardiology with weight gain - Reviewed dietary modifications including *** - Recommend to ***  - Meets financial criteria for *** patient assistance program through ***. Will collaborate with provider, CPhT, and patient to pursue assistance.    Written patient instructions provided. Patient verbalized understanding of treatment plan.   Follow Up Plan:  Pharmacist *** PCP clinic visit in ***   Lorain Baseman, PharmD [facility] [phone]

## 2024-06-14 NOTE — Telephone Encounter (Signed)
 Too soon. Advised pt to check with pharmacy. KH

## 2024-06-14 NOTE — Telephone Encounter (Signed)
 Pt was advise to check with pharmacy for refill before we send in another script for faxiga. Kh

## 2024-06-15 ENCOUNTER — Telehealth: Payer: Self-pay

## 2024-06-15 NOTE — Progress Notes (Signed)
 Complex Care Management Care Guide Note  06/15/2024 Name: Tiffany Le MRN: [id number] DOB: Nov 21, 1975  Tiffany Le is a 48 y.o. year old female who is a primary care patient of Paseda, Folashade R, FNP and is actively engaged with the care management team. I reached out to [name] by phone today to assist with re-scheduling  with the Pharmacist.  Follow up plan: Unsuccessful telephone outreach attempt made. A HIPAA compliant phone message was left for the patient providing contact information and requesting a return call.  Leotis Rase [facility], [facility] Guide  Direct Dial: [phone]  Fax [phone]

## 2024-06-17 ENCOUNTER — Ambulatory Visit: Admitting: Physician Assistant

## 2024-06-20 ENCOUNTER — Other Ambulatory Visit (HOSPITAL_COMMUNITY): Payer: Self-pay

## 2024-06-20 ENCOUNTER — Other Ambulatory Visit: Payer: Self-pay

## 2024-06-20 DIAGNOSIS — I214 Non-ST elevation (NSTEMI) myocardial infarction: Secondary | ICD-10-CM

## 2024-06-20 DIAGNOSIS — I25110 Atherosclerotic heart disease of native coronary artery with unstable angina pectoris: Secondary | ICD-10-CM

## 2024-06-20 DIAGNOSIS — I10 Essential (primary) hypertension: Secondary | ICD-10-CM

## 2024-06-20 DIAGNOSIS — Z951 Presence of aortocoronary bypass graft: Secondary | ICD-10-CM

## 2024-06-20 DIAGNOSIS — E785 Hyperlipidemia, unspecified: Secondary | ICD-10-CM

## 2024-06-20 MED ORDER — REPATHA SURECLICK 140 MG/ML ~~LOC~~ SOAJ: 140.0000 mg | SUBCUTANEOUS | 3 refills | Status: AC

## 2024-06-20 MED ORDER — BLOOD PRESSURE CUFF MISC: 0 refills | Status: AC

## 2024-06-20 NOTE — Progress Notes (Signed)
 06/20/2024 Name: Tiffany Le MRN: [id number] DOB: April 27, 1976  Chief Complaint  Patient presents with   Diabetes    Tiffany Le is a 48 y.o. year old female who presented for a telephone visit.   They were referred to the pharmacist by their PCP for assistance in managing diabetes. Tiffany Le PMH includes HTN, NSTEMI, CAD Le/p 4vCABG (2022), CHF (EF 55-60% in June 2024, improved from 35-40% in 2022), CVA (2022), OSA, T2DM, BMI > 30, HLD.   Subjective: Patient was last seen by PCP, Tiffany Shall, NP, on 04/26/24. At last visit, BP was 133/58 mmHg. A1C was up to 7.6% from 7.1%. She reported ShOB with activity and fatigue. Occasional CP and lightheadedness when standing.  Today, patient reports doing ok. She reports that she is taking her medications as prescribed. Reports that increase in A1C correlates with increase in regular soda intake recently.  Care Team: Primary Care Provider: Paseda, Folashade Le, Le ; Next Scheduled Visit: 06/21/24 Cardiologist: Tiffany Ferrier, PA; Next Scheduled Visit: 06/17/24  Medication Access/Adherence  Current Pharmacy:  [id number] - [place], [place] - [address] AT [facility] OF [address] [name] [address] Phone: [phone] Fax: [phone]   Patient reports affordability concerns with their medications: No  - Medicaid Patient reports access/transportation concerns to their pharmacy: No  Patient reports adherence concerns with their medications:  No  - fill hx ok  Diabetes:  Current medications: glipizide  5 mg BID, Farxiga  10 mg daily, Ozempic  2 mg weekly Medications tried in the past: Lantus , metformin  IR (diarrhea)  Denies GI AE with Ozempic   Current glucose readings: Lost a meter  Patient denies hypoglycemic Le/sx including dizziness, shakiness, sweating. Patient reports hyperglycemic symptoms including occasionally blurry vision, neuropathy (bottoms of feet, numbness in fingers).    Reports she is having more LE swelling recently  Current meal patterns: Eats 2-3 meals/day - Supper: has large servings of starches at home, meat - less vegetables based on family'Le preference. - Snacks: raw vegetables at work - Drinks: Had been having a lot more regular soda the past few months. Has now avoided soda for 7 days.  Current physical activity: work, cleaning in the house   Hyperlipidemia/ASCVD Risk Reduction  Current lipid lowering medications: rosuvastatin  40 mg daily (prefers to take 2x 20 mg tabs due to pill size), ezetimibe  10 mg daily Medications tried in the past:   Antiplatelet regimen: aspirin  81 mg daily  ASCVD History:  CAD Le/p 4vCABG (2022), CHF (EF 55-60% in June 2024, improved from 35-40% in 2022), CVA (2022) Family History: Mother with CABG at age < 38   Heart Failure (EF 55-60% in June 2024, improved from 35-40% in 2022%):  Current medications:  ACEi/ARB/ARNI: losartan  50 mg daily SGLT2i: Farxiga  10 mg daily Beta blocker: none Mineralocorticoid Receptor Antagonist: spironolactone  25 mg daily Diuretic regimen: torsemide  20 mg BID  Current home blood pressure readings: not checking, has old wrist cuff, would like to obtain a new arm cuff  Patient reports volume overload signs or symptoms including increased lower extremity edema - she has cardiology appt scheduled for 06/24/24.  Objective:  BP Readings from Last 3 Encounters:  06/03/24 (!) 140/76  04/26/24 (!) 133/58  02/26/24 125/81    Lab Results  Component Value Date   HGBA1C 7.6 (A) 04/26/2024   HGBA1C 7.1 (A) 11/24/2023   HGBA1C 6.9 (A) 07/31/2023       Latest Ref Rng & Units 11/24/2023  11:15 AM 03/25/2023    4:10 PM 11/25/2022   11:26 AM  BMP  Glucose 70 - 99 mg/dL 88  93  81   BUN 6 - 24 mg/dL 15  13  15    Creatinine 0.57 - 1.00 mg/dL 9.04  9.18  9.15   BUN/Creat Ratio 9 - 23 16  16  18    Sodium 134 - 144 mmol/L 139  140  141   Potassium 3.5 - 5.2 mmol/L 4.4  4.1  4.0    Chloride 96 - 106 mmol/L 101  100  101   CO2 20 - 29 mmol/L 23  27  25    Calcium  8.7 - 10.2 mg/dL 9.5  8.8  9.7     Lab Results  Component Value Date   CHOL 108 11/24/2023   HDL 36 (L) 11/24/2023   LDLCALC 60 11/24/2023   TRIG 47 11/24/2023   CHOLHDL 3.0 11/24/2023    Medications Reviewed Today     Reviewed by Tiffany Le, RPH (Pharmacist) on 06/20/24 at 1350  Med List Status: <None>   Medication Order Taking? Sig Documenting Provider Last Dose Status Informant  albuterol  (PROVENTIL  HFA) 108 (90 Base) MCG/ACT inhaler [id number]  Inhale 2 puffs into the lungs every 6 (six) hours as needed for wheezing. Tiffany Le  Active   aspirin  EC 81 MG tablet [id number] Yes Take 1 tablet (81 mg total) by mouth daily. Tiffany Bascom RAMAN, NP  Active   Blood Glucose Monitoring Suppl (BLOOD GLUCOSE MONITOR SYSTEM) w/Device KIT [id number]  Test in the morning, at noon, and at bedtime. Tiffany Le  Active   dapagliflozin  propanediol (FARXIGA ) 10 MG TABS tablet [id number] Yes Take 1 tablet (10 mg total) by mouth daily. Tiffany Bascom RAMAN, NP  Active   DULoxetine  (CYMBALTA ) 30 MG capsule [id number] Yes Take 1 capsule (30 mg total) by mouth daily. Tiffany Le  Active   ezetimibe  (ZETIA ) 10 MG tablet [id number] Yes Take 1 tablet (10 mg total) by mouth daily. Tiffany Le  Active   glipiZIDE  (GLUCOTROL ) 5 MG tablet [id number] Yes TAKE 1 TABLET(5 MG) BY MOUTH TWICE DAILY BEFORE A MEAL Nichols, Tonya S, NP  Active   Glucose Blood (BLOOD GLUCOSE TEST STRIPS) STRP [id number]  Test in the morning, at noon, and at bedtime. Tiffany Le  Active   Insulin  Pen Needle (B-D UF III MINI PEN NEEDLES) 31G X 5 MM MISC [id number]  Use as directed with insulin . Tiffany Bascom RAMAN, NP  Active   losartan  (COZAAR ) 50 MG tablet [id number] Yes Take 1 tablet (50 mg total) by mouth at bedtime. Tiffany Bascom RAMAN, NP  Active   methocarbamol  (ROBAXIN ) 500 MG tablet [id number]  Take 1 tablet  (500 mg total) by mouth every 8 (eight) hours as needed for muscle spasms. Tiffany Bascom RAMAN, NP  Active   naproxen  (NAPROSYN ) 375 MG tablet [id number]  Take 1 tablet (375 mg total) by mouth 2 (two) times daily. Arloa Suzen RAMAN, NP  Active   nitroGLYCERIN  (NITROSTAT ) 0.4 MG SL tablet [id number]  DISSOLVE 1 TABLET UNDER THE TONGUE EVERY 5 MINUTES FOR 3 DOSES AS NEEDED FOR CHEST PAIN Tiffany Le  Active   ondansetron  (ZOFRAN ) 4 MG tablet [id number]  Take 1 tablet (4 mg total) by mouth every 8 (eight) hours as needed for nausea or vomiting. Tiffany Bascom RAMAN, NP  Active   rosuvastatin  (CRESTOR ) 20 MG tablet [id number] Yes Take 2 tablets (40  mg total) by mouth daily. Tiffany Le  Active   Semaglutide , 2 MG/DOSE, (OZEMPIC , 2 MG/DOSE,) 8 MG/3ML SOPN [id number] Yes Inject 2 mg into the skin once a week. Tiffany Le  Active   spironolactone  (ALDACTONE ) 25 MG tablet [id number] Yes Take 1 tablet (25 mg total) by mouth daily. Tiffany Bascom RAMAN, NP  Active   torsemide  (DEMADEX ) 20 MG tablet [id number] Yes TAKE 1 TABLET(20 MG) BY MOUTH TWICE DAILY Tiffany Le  Active   traZODone  (DESYREL ) 50 MG tablet [id number]  Take 0.5-1 tablets (25-50 mg total) by mouth at bedtime as needed for sleep. Tiffany Bascom RAMAN, NP  Active   triamcinolone  cream (KENALOG ) 0.1 % [id number]  Apply 1 Application topically 2 (two) times daily. Lang Norleen POUR, PA-C  Active   Med List Note Callie Iantha DASEN, [facility] 10/02/20 1008): Daughter [phone] is living with patient              Assessment/Plan:   Diabetes: - Currently uncontrolled with most recent A1C of 7.6% above goal <7%. Patient is not eligible to obtain Mounjaro through insurance as she has not tried Trulicity and she only has mild OSA (moderate-severe required to obtain Zepbound via Medicaid). Would prefer for her to stay on Ozempic  rather than switch to Trulicity, since Trulicity has less potent BG lowering effect. Since patient  has identified and corrected dietary factors (soda intake) that were likely contributing to worsened A1C, do not feel we need to escalate pharmacotherapy today. Will screen for LIBERATE study at PCP appt tomorrow. In the future, could consider retrial of low-dose XR metformin .  - Last UACR Aug 2024 - 59 mg/g - Reviewed long term cardiovascular and renal outcomes of uncontrolled blood sugar - Reviewed goal A1c, goal fasting, and goal 2 hour post prandial glucose - Reviewed dietary modifications including utilizing the healthy plate method, limiting portion size of carbohydrate foods, increasing intake of protein and non-starchy vegetables. Counseled patient to stay hydrated with water  throughout the day. Commended patient for avoiding soda for the past 7 days. - Reviewed lifestyle modifications including: aiming for 150 minutes of moderate intensity exercise every week.  - Recommend to continue Ozempic  2 mg weekly, glipizide  5 mg BID, Farxiga  10 mg daily - Will investigate whether insurance will cover a new glucometer. Can screen A1C for LIBERATE study tomorrow. If A1C is > 8%, can enroll in LIBERATE to facilitate CGM monitoring.   - Next A1C due 07/26/24     Hyperlipidemia/ASCVD Risk Reduction: - Currently uncontrolled with most recent LDL-C of 60 mg/dL above goal < 55 mg/dL given premature ASCVD. Was taking max-tolerated statin and ezetimibe  prior to last lipid panel. Patient would benefit from [facility]. Agreeable to starting Repatha. - Reviewed long term complications of uncontrolled cholesterol - Reviewed lifestyle recommendations to lower LDL-C including regular physical activity, 5-10% weight loss, eating a diet low in saturated fat, and increasing intake of fiber to at least 25 g per day. - Recommend to continue rosuvastatin  40 mg daily, ezetimibe  10 mg daily - Recommend to start Repatha 140 mg every 14 days. Will collaborate with patient advocate to submit PA.     Heart Failure: - Currently  appropriately managed. Encouraged follow-up with cardiology as scheduled given patient reported LEE. If BP uncontrolled, would prefer to switch to more potent ARB or Entresto  for further titration. - Reviewed appropriate blood pressure monitoring technique and reviewed goal blood pressure - Reviewed to weigh daily and when to  contact cardiology with weight gain - Recommend to continue current regimen: Current medications:  ACEi/ARB/ARNI: losartan  50 mg daily SGLT2i: Farxiga  10 mg daily Beta blocker: none Mineralocorticoid Receptor Antagonist: spironolactone  25 mg daily Diuretic regimen: torsemide  20 mg BID    Written patient instructions provided. Patient verbalized understanding of treatment plan.    Follow Up Plan:  Pharmacist 07/26/24 PCP clinic visit 06/21/24   Lorain Baseman, PharmD [facility] [phone]

## 2024-06-21 ENCOUNTER — Ambulatory Visit (INDEPENDENT_AMBULATORY_CARE_PROVIDER_SITE_OTHER): Payer: Self-pay | Admitting: Nurse Practitioner

## 2024-06-21 ENCOUNTER — Encounter (INDEPENDENT_AMBULATORY_CARE_PROVIDER_SITE_OTHER): Payer: Self-pay

## 2024-06-21 ENCOUNTER — Other Ambulatory Visit: Payer: Self-pay

## 2024-06-21 ENCOUNTER — Encounter: Payer: Self-pay | Admitting: Nurse Practitioner

## 2024-06-21 VITALS — BP 144/73 | HR 79 | Wt 266.0 lb

## 2024-06-21 DIAGNOSIS — I25110 Atherosclerotic heart disease of native coronary artery with unstable angina pectoris: Secondary | ICD-10-CM | POA: Diagnosis not present

## 2024-06-21 DIAGNOSIS — N939 Abnormal uterine and vaginal bleeding, unspecified: Secondary | ICD-10-CM

## 2024-06-21 DIAGNOSIS — E1165 Type 2 diabetes mellitus with hyperglycemia: Secondary | ICD-10-CM

## 2024-06-21 DIAGNOSIS — R2231 Localized swelling, mass and lump, right upper limb: Secondary | ICD-10-CM

## 2024-06-21 DIAGNOSIS — I10 Essential (primary) hypertension: Secondary | ICD-10-CM

## 2024-06-21 DIAGNOSIS — F32A Depression, unspecified: Secondary | ICD-10-CM

## 2024-06-21 DIAGNOSIS — Z8673 Personal history of transient ischemic attack (TIA), and cerebral infarction without residual deficits: Secondary | ICD-10-CM

## 2024-06-21 DIAGNOSIS — F411 Generalized anxiety disorder: Secondary | ICD-10-CM

## 2024-06-21 DIAGNOSIS — G4733 Obstructive sleep apnea (adult) (pediatric): Secondary | ICD-10-CM | POA: Diagnosis not present

## 2024-06-21 DIAGNOSIS — E1140 Type 2 diabetes mellitus with diabetic neuropathy, unspecified: Secondary | ICD-10-CM

## 2024-06-21 DIAGNOSIS — F331 Major depressive disorder, recurrent, moderate: Secondary | ICD-10-CM

## 2024-06-21 DIAGNOSIS — E785 Hyperlipidemia, unspecified: Secondary | ICD-10-CM

## 2024-06-21 LAB — POCT GLYCOSYLATED HEMOGLOBIN (HGB A1C): HbA1c, POC (controlled diabetic range): 7.7 % — AB (ref 0.0–7.0)

## 2024-06-21 MED ORDER — ESCITALOPRAM OXALATE 10 MG PO TABS: 10.0000 mg | ORAL_TABLET | Freq: Every day | ORAL | 1 refills | Status: AC

## 2024-06-21 MED ORDER — OZEMPIC (2 MG/DOSE) 8 MG/3ML ~~LOC~~ SOPN: 2.0000 mg | PEN_INJECTOR | SUBCUTANEOUS | 3 refills | Status: AC

## 2024-06-21 MED ORDER — ESCITALOPRAM OXALATE 10 MG PO TABS: 10.0000 mg | ORAL_TABLET | Freq: Every day | ORAL | 1 refills | Status: DC

## 2024-06-21 NOTE — Progress Notes (Signed)
 Established Patient Office Visit  Subjective:  Patient ID: Tiffany Le, female    DOB: Dec 30, 1975  Age: 48 y.o. MRN: [id number]  CC:  Chief Complaint  Patient presents with   Depression   Anxiety   Diabetes   Medication Management    Cymbalta , had an issue with swallowing medication and would like an alternative    Rash    Only happens with getting upset     HPI   Discussed the use of AI scribe software for clinical note transcription with the patient, who gave verbal consent to proceed.  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  who presents for medication review and management of her chronic conditions.  Her blood pressure was 144/73 today. She is currently taking losartan  50 mg daily, spironolactone  25 mg daily, and torsemide  20 mg twice a day for blood pressure management. She has not been checking her blood pressure at home as her previous monitor was outdated, and a new one was ordered yesterday.  For diabetes management, she is on Farxiga  10 mg daily, glipizide  5 mg twice a day, and Ozempic  2 mg once a week. She has not been monitoring her blood glucose due to losing her glucose monitor during a move. Her last A1c was 7.7, up from 7.5 previously. She faces dietary challenges, eating once a day, often late, and consuming juice and zero-calorie lemonade instead of soda.  She is taking Zetia  10 mg daily and rosuvastatin  40 mg for hyperlipidemia.  She has not yet started Repatha injections, which were prescribed recently. She also takes baby aspirin  daily and uses an albuterol  inhaler as needed.  She uses a CPAP machine for sleep apnea, which has been causing headaches and nasal swelling. She started using the machine recently after her last visit.  She mentions a non-painful knot on her wrist that appears to be getting larger. Additionally, her menstrual cycle started on November 1st and has been ongoing for 11 days, which is longer than usual. The bleeding is not heavy but constant.   No chest pain or shortness of breath but reports fatigue and swelling.    Assessment & Plan         Past Medical History:  Diagnosis Date   CHF (congestive heart failure) (HCC)    Coronary artery disease    CVA (cerebral vascular accident) (HCC) 09/2020   Diabetes mellitus without complication (HCC)    Diabetic neuropathy (HCC) 02/2020   Heart murmur    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   Hyperlipidemia age 80   Hypertension age 44   Hypocalcemia 11/2019   Hypokalemia 11/2019   Iron deficiency anemia    NSTEMI (non-ST elevated myocardial infarction) (HCC) 09/2020   Obesity    Proteinuria 12/09/2019   Pulmonary embolism (HCC) 09/2020    Past Surgical History:  Procedure Laterality Date   APPLICATION OF WOUND VAC N/A 12/14/2020   Procedure: APPLICATION OF WOUND VAC;  Surgeon: Tiffany Sudie DEL, MD;  Location: [facility] OR;  Service: Thoracic;  Laterality: N/A;   APPLICATION OF WOUND VAC N/A 12/25/2020   Procedure: WOUND  VAC CHANGE;  Surgeon: Tiffany Bartlett PEDLAR, MD;  Location: [facility] OR;  Service: Thoracic;  Laterality: N/A;   CARDIAC CATHETERIZATION  09/25/2020   CESAREAN SECTION  3/96   CLIPPING OF ATRIAL APPENDAGE N/A 10/09/2020   Procedure: CLIPPING OF ATRIAL APPENDAGE USING ATRICURE CLIP;  Surgeon: Tiffany Bartlett PEDLAR, MD;  Location: [facility] OR;  Service: Open Heart Surgery;  Laterality: N/A;   CORONARY ARTERY BYPASS GRAFT N/A 10/09/2020   Procedure: CORONARY  ARTERY BYPASS GRAFTING (CABG), ON PUMP, TIMES FOUR, USING LEFT INTERNAL MAMMARY ARTERY AND LEFT RADIAL ARTERY (OPEN HARVEST);  Surgeon: Tiffany Bartlett PEDLAR, MD;  Location[facility] OR;  Service: Open Heart Surgery;  Laterality: N/A;  POSSIBLE BIMA   INCISION AND DRAINAGE OF WOUND N/A 12/20/2020   Procedure: STERNAL WOUND DEBRIDEMENT WITH SKIN SUBSTITUTION AND ABRA;  Surgeon: Tiffany Estefana RAMAN, DO;  Location: [facility] OR;  Service: Plastics;  Laterality: N/A;   IR THORACENTESIS ASP PLEURAL SPACE W/IMG GUIDE  10/16/2020   LEFT HEART CATH AND CORONARY ANGIOGRAPHY N/A 09/25/2020   Procedure: LEFT HEART CATH AND CORONARY ANGIOGRAPHY;  Surgeon: Tiffany Alm ORN, MD;  Location: [facility] INVASIVE CV LAB;  Service: Cardiovascular;  Laterality: N/A;   ORIF FEMUR FRACTURE     RADIAL ARTERY HARVEST Left 10/09/2020   Procedure: RADIAL ARTERY HARVEST;  Surgeon: Tiffany Bartlett PEDLAR, MD;  Location: [facility] OR;  Service: Open Heart Surgery;  Laterality: Left;   STERNAL CLOSURE N/A 12/25/2020   Procedure: STERNAL CLOSURE;  Surgeon: Tiffany Bartlett PEDLAR, MD;  Location: [facility] OR;  Service: Thoracic;  Laterality: N/A;   STERNAL WOUND DEBRIDEMENT N/A 12/14/2020   Procedure: STERNAL WOUND DEBRIDEMENT;  Surgeon: Tiffany Sudie DEL, MD;  Location: [facility] OR;  Service: Thoracic;  Laterality: N/A;   STERNAL WOUND DEBRIDEMENT N/A 12/18/2020   Procedure: STERNAL WOUND DEBRIDEMENT, Wound Vac Change;  Surgeon: Tiffany Bartlett PEDLAR, MD;  Location: [facility] OR;  Service: Thoracic;  Laterality: N/A;   STERNAL WOUND DEBRIDEMENT N/A 12/25/2020   Procedure: STERNAL WOUND IRRIGATION AND DEBRIDEMENT;  Surgeon: Tiffany Bartlett PEDLAR, MD;  Location: [facility] OR;  Service: Thoracic;  Laterality: N/A;   TEE WITHOUT CARDIOVERSION N/A 10/09/2020   Procedure: TRANSESOPHAGEAL ECHOCARDIOGRAM (TEE);  Surgeon: Tiffany Bartlett PEDLAR, MD;  Location: [facility] OR;  Service: Open Heart Surgery;  Laterality: N/A;    Family History  Problem Relation Age of Onset   Heart disease Mother        CABG <50   Hypertension Mother     Diabetes Mother    Hyperlipidemia Mother    Heart attack Mother    Heart disease Father    Hyperlipidemia Father    Hypertension Father    Early death Father    Healthy Daughter    Diabetes Maternal Aunt    Cancer Maternal Grandmother        stomach cancer   Diabetes Maternal Aunt    Cancer Cousin        colon cancer (dx'd 13)    Social History   Socioeconomic History   Marital status: Single    Spouse name: Not on file   Number of children: Not on file   Years of education: 12   Highest education level: Some college, no degree  Occupational History   Occupation: estate manager/land agent  Tobacco Use   Smoking status: Never   Smokeless tobacco: Never  Vaping Use   Vaping status: Never Used  Substance and Sexual Activity   Alcohol use: No   Drug use: No   Sexual  activity: Not Currently    Partners: Male    Comment: husband currently in [place], returning 02/2013  Other Topics Concern   Not on file  Social History Narrative   Works at a call center, 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  Lives with 83 year old daughter, 1 cat Husband is living in [place] (citizen there), trying to come to [place]  (has been delayed)   Social Drivers of Health   Financial Resource Strain: Patient Declined (04/26/2024)   Overall Financial Resource Strain (CARDIA)    Difficulty of Paying Living Expenses: Patient declined  Food Insecurity: Patient Declined (04/26/2024)   Hunger Vital Sign    Worried About Running Out of Food in the Last Year: Patient declined    Ran Out of Food in the Last Year: Patient declined  Transportation Needs: Patient Declined (04/26/2024)   PRAPARE - Administrator, Civil Service (Medical): Patient declined    Lack of Transportation (Non-Medical): Patient declined  Physical Activity: Unknown (11/23/2023)   Exercise Vital Sign    Days of Exercise per Week: 0 days    Minutes of Exercise per Session: Not on file  Stress: Stress Concern Present (11/23/2023)    [employer] of Occupational Health - Occupational Stress Questionnaire    Feeling of Stress : Very much  Social Connections: Unknown (04/26/2024)   Social Connection and Isolation Panel    Frequency of Communication with Friends and Family: Patient declined    Frequency of Social Gatherings with Friends and Family: Patient declined    Attends Religious Services: Patient declined    Database Administrator or Organizations: Patient declined    Attends Banker Meetings: Not on file    Marital Status: Patient declined  Intimate Partner Violence: Not At Risk (01/08/2021)   Humiliation, Afraid, Rape, and Kick questionnaire    Fear of Current or Ex-Partner: No    Emotionally Abused: No    Physically Abused: No    Sexually Abused: No    Outpatient Medications Prior to Visit  Medication Sig Dispense Refill   albuterol  (PROVENTIL  HFA) 108 (90 Base) MCG/ACT inhaler Inhale 2 puffs into the lungs every 6 (six) hours as needed for wheezing. 8.5 g 12   aspirin  EC 81 MG tablet Take 1 tablet (81 mg total) by mouth daily. 90 tablet 1   Blood Glucose Monitoring Suppl (BLOOD GLUCOSE MONITOR SYSTEM) w/Device KIT Test in the morning, at noon, and at bedtime. 1 kit 0   dapagliflozin  propanediol (FARXIGA ) 10 MG TABS tablet Take 1 tablet (10 mg total) by mouth daily. 90 tablet 3   ezetimibe  (ZETIA ) 10 MG tablet Take 1 tablet (10 mg total) by mouth daily. 90 tablet 3   glipiZIDE  (GLUCOTROL ) 5 MG tablet TAKE 1 TABLET(5 MG) BY MOUTH TWICE DAILY BEFORE A MEAL 180 tablet 1   losartan  (COZAAR ) 50 MG tablet Take 1 tablet (50 mg total) by mouth at bedtime. 90 tablet 2   rosuvastatin  (CRESTOR ) 20 MG tablet Take 2 tablets (40 mg total) by mouth daily. 180 tablet 3   spironolactone  (ALDACTONE ) 25 MG tablet Take 1 tablet (25 mg total) by mouth daily. 90 tablet 2   torsemide  (DEMADEX ) 20 MG tablet TAKE 1 TABLET(20 MG) BY MOUTH TWICE DAILY 60 tablet 3   traZODone  (DESYREL ) 50 MG tablet Take 0.5-1 tablets  (25-50 mg total) by mouth at bedtime as needed for sleep. 30 tablet 3   Semaglutide , 2 MG/DOSE, (OZEMPIC , 2 MG/DOSE,) 8 MG/3ML  SOPN Inject 2 mg into the skin once a week. 3 mL 0   Blood Pressure Monitoring (BLOOD PRESSURE CUFF) MISC Use as directed to monitor blood pressure 1 each 0   Evolocumab (REPATHA SURECLICK) 140 MG/ML SOAJ Inject 140 mg into the skin every 14 (fourteen) days. (Patient not taking: Reported on 06/21/2024) 6 mL 3   Glucose Blood (BLOOD GLUCOSE TEST STRIPS) STRP Test in the morning, at noon, and at bedtime. 100 strip 0   Insulin  Pen Needle (B-D UF III MINI PEN NEEDLES) 31G X 5 MM MISC Use as directed with insulin . 300 each 3   methocarbamol  (ROBAXIN ) 500 MG tablet Take 1 tablet (500 mg total) by mouth every 8 (eight) hours as needed for muscle spasms. 21 tablet 0   naproxen  (NAPROSYN ) 375 MG tablet Take 1 tablet (375 mg total) by mouth 2 (two) times daily. 20 tablet 0   nitroGLYCERIN  (NITROSTAT ) 0.4 MG SL tablet DISSOLVE 1 TABLET UNDER THE TONGUE EVERY 5 MINUTES FOR 3 DOSES AS NEEDED FOR CHEST PAIN 25 tablet 5   ondansetron  (ZOFRAN ) 4 MG tablet Take 1 tablet (4 mg total) by mouth every 8 (eight) hours as needed for nausea or vomiting. 20 tablet 0   triamcinolone  cream (KENALOG ) 0.1 % Apply 1 Application topically 2 (two) times daily. 30 g 0   DULoxetine  (CYMBALTA ) 30 MG capsule Take 1 capsule (30 mg total) by mouth daily. (Patient not taking: Reported on 06/21/2024) 60 capsule 2   No facility-administered medications prior to visit.    Allergies  Allergen Reactions   Oxycodone  Nausea And Vomiting and Other (See Comments)    Pt states she had dizziness   Penicillins Rash    As a baby    ROS Review of Systems  Constitutional:  Positive for fatigue. Negative for appetite change, chills and fever.  HENT:  Negative for congestion, postnasal drip, rhinorrhea and sneezing.   Respiratory:  Negative for cough, shortness of breath and wheezing.   Cardiovascular:  Negative for  chest pain, palpitations and leg swelling.  Gastrointestinal:  Negative for abdominal pain, constipation, nausea and vomiting.  Genitourinary:  Positive for vaginal bleeding. Negative for difficulty urinating, dysuria, flank pain and frequency.  Musculoskeletal:  Negative for arthralgias, back pain, joint swelling and myalgias.  Skin:  Negative for color change, pallor, rash and wound.  Neurological:  Negative for dizziness, facial asymmetry, weakness, numbness and headaches.  Psychiatric/Behavioral:  Positive for sleep disturbance. Negative for behavioral problems, confusion, self-injury and suicidal ideas.       Objective:    Physical Exam Vitals and nursing note reviewed.  Constitutional:      General: She is not in acute distress.    Appearance: Normal appearance. She is obese. She is not ill-appearing, toxic-appearing or diaphoretic.  HENT:     Mouth/Throat:     Mouth: Mucous membranes are moist.     Pharynx: Oropharynx is clear. No oropharyngeal exudate or posterior oropharyngeal erythema.  Eyes:     General: No scleral icterus.       Right eye: No discharge.        Left eye: No discharge.     Extraocular Movements: Extraocular movements intact.     Conjunctiva/sclera: Conjunctivae normal.  Cardiovascular:     Rate and Rhythm: Normal rate and regular rhythm.     Pulses: Normal pulses.     Heart sounds: Normal heart sounds. No murmur heard.    No friction rub. No gallop.  Pulmonary:  Effort: Pulmonary effort is normal. No respiratory distress.     Breath sounds: Normal breath sounds. No stridor. No wheezing, rhonchi or rales.  Chest:     Chest wall: No tenderness.  Abdominal:     General: There is no distension.     Palpations: Abdomen is soft.     Tenderness: There is no abdominal tenderness. There is no right CVA tenderness, left CVA tenderness or guarding.  Musculoskeletal:        General: No swelling, tenderness, deformity or signs of injury.     Right lower  leg: No edema.     Left lower leg: No edema.     Comments: Right wrist mass, no redness or tenderness noted on examination has palpable radial pulse  Skin:    General: Skin is warm and dry.     Capillary Refill: Capillary refill takes less than 2 seconds.     Coloration: Skin is not jaundiced or pale.     Findings: No bruising, erythema or lesion.  Neurological:     Mental Status: She is alert and oriented to person, place, and time.     Motor: No weakness.     Coordination: Coordination normal.     Gait: Gait normal.  Psychiatric:        Mood and Affect: Mood normal.        Behavior: Behavior normal.        Thought Content: Thought content normal.        Judgment: Judgment normal.     BP (!) 144/73   Pulse 79   Wt 266 lb (120.7 kg)   LMP 05/11/2024 (Approximate)   SpO2 100%   BMI 45.66 kg/m  Wt Readings from Last 3 Encounters:  06/21/24 266 lb (120.7 kg)  04/26/24 263 lb (119.3 kg)  02/26/24 264 lb (119.7 kg)    Lab Results  Component Value Date   TSH 2.120 11/25/2022   Lab Results  Component Value Date   WBC 6.7 11/24/2023   HGB 12.2 11/24/2023   HCT 37.6 11/24/2023   MCV 86 11/24/2023   PLT 286 11/24/2023   Lab Results  Component Value Date   NA 139 11/24/2023   K 4.4 11/24/2023   CO2 23 11/24/2023   GLUCOSE 88 11/24/2023   BUN 15 11/24/2023   CREATININE 0.95 11/24/2023   BILITOT 0.3 11/24/2023   ALKPHOS 76 11/24/2023   AST 20 11/24/2023   ALT 21 11/24/2023   PROT 8.5 11/24/2023   ALBUMIN  4.5 11/24/2023   CALCIUM  9.5 11/24/2023   ANIONGAP 13 01/07/2022   EGFR 74 11/24/2023   Lab Results  Component Value Date   CHOL 108 11/24/2023   Lab Results  Component Value Date   HDL 36 (L) 11/24/2023   Lab Results  Component Value Date   LDLCALC 60 11/24/2023   Lab Results  Component Value Date   TRIG 47 11/24/2023   Lab Results  Component Value Date   CHOLHDL 3.0 11/24/2023   Lab Results  Component Value Date   HGBA1C 7.7 (A) 06/21/2024       Assessment & Plan:   Problem List Items Addressed This Visit       Cardiovascular and Mediastinum   Essential hypertension, benign - Primary (Chronic)   BP Readings from Last 3 Encounters:  06/21/24 (!) 144/73  06/03/24 (!) 140/76  04/26/24 (!) 133/58   Blood pressure at 144/73, but has been previously well-controlled. Emphasized maintaining BP below 130/80 to prevent stroke  recurrence. - Encouraged home blood pressure monitoring with new monitor. - Continue current antihypertensive regimen.  Keep upcoming appointment with cardiology DASH diet advised, courage moderate exercises at least 30 minutes 5 days a week as tolerated       Relevant Orders   CMP14+EGFR   CBC   Coronary artery disease involving native coronary artery of native heart with unstable angina pectoris (HCC)   Atherosclerotic heart disease with history of stroke Emphasized managing BP, cholesterol, and blood sugar to prevent stroke recurrence.  Continue aspirin  81 mg daily, Crestor  40 mg daily        Relevant Orders   Lipid panel     Respiratory   OSA (obstructive sleep apnea)   Obstructive sleep apnea Started CPAP, experiencing headaches and nasal swelling. Possible mask fit issues. - Discuss CPAP mask fit and potential adjustments with cardiologist.         Endocrine   Poorly controlled type 2 diabetes mellitus with neuropathy (HCC)   Lab Results  Component Value Date   HGBA1C 7.7 (A) 06/21/2024   A1c at 7.7 indicates suboptimal glycemic control. Dietary habits contribute to poor control. Discussed dietary and exercise importance. - Encouraged dietary modifications: Avoiding juice intake, increase water , follow low-carb, high-protein diet. - Encouraged regular exercise: 30 minutes, 5 days a week. - Advised to purchase new glucometer for home blood sugar monitoring. Continue glipizide  5 mg twice daily, Farxiga  10 mg daily, Ozempic  2 mg once weekly Keep upcoming appointment with the  clinical pharmacist who has been assisting with medication management.        Relevant Orders   Microalbumin/Creatinine Ratio, Urine     Genitourinary   Abnormal uterine bleeding   Abnormal uterine bleeding Menstrual cycle ongoing for 11 days. Possible fibroids or hormonal changes. Discussed need for further evaluation. - Ordered pelvic ultrasound to evaluate for fibroids or other causes. - Ordered CBC to assess for anemia due to prolonged bleeding.       Relevant Orders   US  Pelvic Complete With Transvaginal     Other   Dyslipidemia   Lab Results  Component Value Date   CHOL 108 11/24/2023   HDL 36 (L) 11/24/2023   LDLCALC 60 11/24/2023   TRIG 47 11/24/2023   CHOLHDL 3.0 11/24/2023    On Zetia  and rosuvastatin . Repatha prescribed but not started. Emphasized cholesterol management to prevent cardiovascular events. - Start Repatha injection as prescribed.  Continue Zetia  10 mg daily, Crestor  40 mg daily, goal is less than 55 - Ordered lipid panel to assess cholesterol levels.       Mass of right wrist   Wrist mass Non-painful, fixed wrist mass, possibly vein enlargement. X-ray ordered but has not been completed, encouraged to get the x-ray completed as ordered      Major depression, recurrent      06/21/2024    2:22 PM 04/26/2024    2:28 PM 11/24/2023   10:51 AM  Depression screen PHQ 2/9  Decreased Interest 1 3 0  Down, Depressed, Hopeless 1 3 0  PHQ - 2 Score 2 6 0  Altered sleeping 3 3   Tired, decreased energy 3 3   Change in appetite 1 0   Feeling bad or failure about yourself  0 0   Trouble concentrating 1 1   Moving slowly or fidgety/restless 0 3   Suicidal thoughts 0 0   PHQ-9 Score 10 16    Difficult doing work/chores  Somewhat difficult  Data saved with a previous flowsheet row definition  She has not been taking duloxetine  due to trouble swallowing the capsule Prescribed Lexapro 10 mg daily, referral for counseling placed Follow-up in 6  weeks She denies SI, HI       Relevant Medications   escitalopram (LEXAPRO) 10 MG tablet   History of stroke   Continue aspirin  81 mg daily, Crestor  40 mg daily      GAD (generalized anxiety disorder)      06/21/2024    2:20 PM 04/26/2024    2:31 PM  GAD 7 : Generalized Anxiety Score  Nervous, Anxious, on Edge 2 3  Control/stop worrying 1 0  Worry too much - different things 3 0  Trouble relaxing 3 1  Restless 0 0  Easily annoyed or irritable 3 3  Afraid - awful might happen 3 2  Total GAD 7 Score 15 9  Anxiety Difficulty  Not difficult at all  She has not been taking duloxetine  due to trouble swallowing the capsule Prescribed Lexapro 10 mg daily, referral for counseling placed Follow-up in 6 weeks        Relevant Medications   escitalopram (LEXAPRO) 10 MG tablet    Meds ordered this encounter  Medications   DISCONTD: escitalopram (LEXAPRO) 10 MG tablet    Sig: Take 1 tablet (10 mg total) by mouth daily.    Dispense:  60 tablet    Refill:  1   escitalopram (LEXAPRO) 10 MG tablet    Sig: Take 1 tablet (10 mg total) by mouth daily.    Dispense:  60 tablet    Refill:  1    Follow-up: Return in about 6 weeks (around 08/02/2024) for ANXIETY, DEPRESSION.    Whittney Steenson R Faithlyn Recktenwald, FNP

## 2024-06-21 NOTE — Assessment & Plan Note (Signed)
 Abnormal uterine bleeding Menstrual cycle ongoing for 11 days. Possible fibroids or hormonal changes. Discussed need for further evaluation. - Ordered pelvic ultrasound to evaluate for fibroids or other causes. - Ordered CBC to assess for anemia due to prolonged bleeding.

## 2024-06-21 NOTE — Assessment & Plan Note (Signed)
 Obstructive sleep apnea Started CPAP, experiencing headaches and nasal swelling. Possible mask fit issues. - Discuss CPAP mask fit and potential adjustments with cardiologist.

## 2024-06-21 NOTE — Assessment & Plan Note (Signed)
 Continue aspirin  81 mg daily, Crestor  40 mg daily

## 2024-06-21 NOTE — Assessment & Plan Note (Signed)
 Lab Results  Component Value Date   CHOL 108 11/24/2023   HDL 36 (L) 11/24/2023   LDLCALC 60 11/24/2023   TRIG 47 11/24/2023   CHOLHDL 3.0 11/24/2023    On Zetia  and rosuvastatin . Repatha prescribed but not started. Emphasized cholesterol management to prevent cardiovascular events. - Start Repatha injection as prescribed.  Continue Zetia  10 mg daily, Crestor  40 mg daily, goal is less than 55 - Ordered lipid panel to assess cholesterol levels.

## 2024-06-21 NOTE — Patient Instructions (Addendum)
 Goal for fasting blood sugar ranges from 80 to 120 and 2 hours after any meal or at bedtime should be between 130 to 170.    Please call [phone]   to schedule your mammogram.  [facility]. [address] [place], [place] [address]. [place] .    Please get your x-ray done at [facility] Address: [address] [place], [place], [place] [address] Phone: [phone]   Around 3 times per week, check your blood pressure 2 times per day. once in the morning and once in the evening. The readings should be at least one minute apart. Write down these values and bring them to your next nurse visit/appointment.  When you check your BP, make sure you have been doing something calm/relaxing 5 minutes prior to checking. Both feet should be flat on the floor and you should be sitting. Use your left arm and make sure it is in a relaxed position (on a table), and that the cuff is at the approximate level/height of your heart.  Blood sugar goal is less than 130/80   It is important that you exercise regularly at least 30 minutes 5 times a week as tolerated  Think about what you will eat, plan ahead. Choose  clean, green, fresh or frozen over canned, processed or packaged foods which are more sugary, salty and fatty. 70 to 75% of food eaten should be vegetables and fruit. Three meals at set times with snacks allowed between meals, but they must be fruit or vegetables. Aim to eat over a 12 hour period , example 7 am to 7 pm, and STOP after  your last meal of the day. Drink water ,generally about 64 ounces per day, no other drink is as healthy. Fruit juice is best enjoyed in a healthy way, by EATING the fruit.  Thanks for choosing [facility] we consider it a privelige to serve you.

## 2024-06-21 NOTE — Assessment & Plan Note (Signed)
 Wrist mass Non-painful, fixed wrist mass, possibly vein enlargement. X-ray ordered but has not been completed, encouraged to get the x-ray completed as ordered

## 2024-06-21 NOTE — Assessment & Plan Note (Signed)
    06/21/2024    2:20 PM 04/26/2024    2:31 PM  GAD 7 : Generalized Anxiety Score  Nervous, Anxious, on Edge 2 3  Control/stop worrying 1 0  Worry too much - different things 3 0  Trouble relaxing 3 1  Restless 0 0  Easily annoyed or irritable 3 3  Afraid - awful might happen 3 2  Total GAD 7 Score 15 9  Anxiety Difficulty  Not difficult at all  She has not been taking duloxetine  due to trouble swallowing the capsule Prescribed Lexapro 10 mg daily, referral for counseling placed Follow-up in 6 weeks

## 2024-06-21 NOTE — Assessment & Plan Note (Signed)
 BP Readings from Last 3 Encounters:  06/21/24 (!) 144/73  06/03/24 (!) 140/76  04/26/24 (!) 133/58   Blood pressure at 144/73, but has been previously well-controlled. Emphasized maintaining BP below 130/80 to prevent stroke recurrence. - Encouraged home blood pressure monitoring with new monitor. - Continue current antihypertensive regimen.  Keep upcoming appointment with cardiology DASH diet advised, courage moderate exercises at least 30 minutes 5 days a week as tolerated

## 2024-06-21 NOTE — Assessment & Plan Note (Signed)
 Lab Results  Component Value Date   HGBA1C 7.7 (A) 06/21/2024   A1c at 7.7 indicates suboptimal glycemic control. Dietary habits contribute to poor control. Discussed dietary and exercise importance. - Encouraged dietary modifications: Avoiding juice intake, increase water , follow low-carb, high-protein diet. - Encouraged regular exercise: 30 minutes, 5 days a week. - Advised to purchase new glucometer for home blood sugar monitoring. Continue glipizide  5 mg twice daily, Farxiga  10 mg daily, Ozempic  2 mg once weekly Keep upcoming appointment with the clinical pharmacist who has been assisting with medication management.

## 2024-06-21 NOTE — Assessment & Plan Note (Signed)
    06/21/2024    2:22 PM 04/26/2024    2:28 PM 11/24/2023   10:51 AM  Depression screen PHQ 2/9  Decreased Interest 1 3 0  Down, Depressed, Hopeless 1 3 0  PHQ - 2 Score 2 6 0  Altered sleeping 3 3   Tired, decreased energy 3 3   Change in appetite 1 0   Feeling bad or failure about yourself  0 0   Trouble concentrating 1 1   Moving slowly or fidgety/restless 0 3   Suicidal thoughts 0 0   PHQ-9 Score 10 16    Difficult doing work/chores  Somewhat difficult      Data saved with a previous flowsheet row definition  She has not been taking duloxetine  due to trouble swallowing the capsule Prescribed Lexapro 10 mg daily, referral for counseling placed Follow-up in 6 weeks She denies SI, HI

## 2024-06-21 NOTE — Assessment & Plan Note (Signed)
 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  Continue aspirin  81 mg daily, Crestor  40 mg daily

## 2024-06-21 NOTE — Progress Notes (Signed)
 S:     Chief Complaint  Patient presents with   Diabetes   Tiffany Le is a 48 y.o. year old female who presented for a telephone visit.   They were referred to the pharmacist by their PCP for assistance in managing diabetes. [name] PMH includes HTN, NSTEMI, CAD s/p 4vCABG (2022), CHF (EF 55-60% in June 2024, improved from 35-40% in 2022), CVA (2022), OSA, T2DM, BMI > 30, HLD.     Subjective: Patient was last seen by PCP, Lorice Shall, NP, on 04/26/24. At last visit, BP was 133/58 mmHg. A1C was up to 7.6% from 7.1%. She reported ShOB with activity and fatigue. Occasional CP and lightheadedness when standing. She had a pharmacy telephone call yesterday. No changes made to DM meds. Instructed to start Repatha. PA was approved. Patient had PCP appt today and we decided to screen for LIBREATE.   Today, patient reports doing ok. She is following up with PCP for anxiety and depression.    Care Team: Primary Care Provider: Paseda, Folashade R, FNP ; Next Scheduled Visit: 06/21/24 Cardiologist: Glendia Ferrier, PA; Next Scheduled Visit: 06/17/24   Medication Access/Adherence   Current Pharmacy:  [id number] - [place], [place] - [address] AT [facility] OF [address] [name] [address] Phone: [phone] Fax: [phone]     Patient reports affordability concerns with their medications: No  - Medicaid Patient reports access/transportation concerns to their pharmacy: No  Patient reports adherence concerns with their medications:  No  - fill hx ok   Diabetes:   Current medications: glipizide  5 mg BID, Farxiga  10 mg daily, Ozempic  2 mg weekly Medications tried in the past: Lantus , metformin  IR (diarrhea)   Denies GI AE with Ozempic    Current glucose readings: Lost meter. Cannot fill Accu Chek again via insurance (too soon).    Patient denies hypoglycemic s/sx including dizziness, shakiness, sweating. Patient reports  hyperglycemic symptoms including occasionally blurry vision, neuropathy (bottoms of feet, numbness in fingers).    Reports she is having more LE swelling recently   Current meal patterns: Eats 2-3 meals/day - Supper: has large servings of starches at home, meat - less vegetables based on family's preference. - Snacks: raw vegetables at work - Drinks: Had been having a lot more regular soda the past few months. Has now avoided soda for 7 days.   Current physical activity: work, cleaning in the house     Hyperlipidemia/ASCVD Risk Reduction   Current lipid lowering medications: rosuvastatin  40 mg daily (prefers to take 2x 20 mg tabs due to pill size), ezetimibe  10 mg daily, Repatha 140 mg every 14 days (prescribed yesterday) Medications tried in the past:    Antiplatelet regimen: aspirin  81 mg daily   ASCVD History:  CAD s/p 4vCABG (2022), CHF (EF 55-60% in June 2024, improved from 35-40% in 2022), CVA (2022) Family History: Mother with CABG at age < 34     Heart Failure (EF 55-60% in June 2024, improved from 35-40% in 2022%):   Current medications:  ACEi/ARB/ARNI: losartan  50 mg daily SGLT2i: Farxiga  10 mg daily Beta blocker: none Mineralocorticoid Receptor Antagonist: spironolactone  25 mg daily Diuretic regimen: torsemide  20 mg BID   Current home blood pressure readings: not checking, has old wrist cuff, would like to obtain a new arm cuff   Patient reports volume overload signs or symptoms including increased lower extremity edema - she has cardiology appt scheduled for 06/24/24  O:   Lab Results  Component Value Date   HGBA1C 7.7 (A) 06/21/2024    Lipid Panel     Component Value Date/Time   CHOL 108 11/24/2023 1115   TRIG 47 11/24/2023 1115   HDL 36 (L) 11/24/2023 1115   CHOLHDL 3.0 11/24/2023 1115   CHOLHDL 4.4 09/25/2020 1418   VLDL 10 09/25/2020 1418   LDLCALC 60 11/24/2023 1115    Clinical Atherosclerotic Cardiovascular Disease (ASCVD): Yes  The ASCVD  Risk score (Arnett DK, et al., 2019) failed to calculate for the following reasons:   Risk score cannot be calculated because patient has a medical history suggesting prior/existing ASCVD     A/P:  LIBERATE Study:  - Patient did not qualify for LIBERATE with A1C of 7.7%   Diabetes: - Currently uncontrolled with most recent A1C of 7.7% above goal <7%. Patient is not eligible to obtain Mounjaro through insurance as she has not tried Trulicity and she only has mild OSA (moderate-severe required to obtain Zepbound via Medicaid). Would prefer for her to stay on Ozempic  rather than switch to Trulicity, since Trulicity has less potent BG lowering effect. Since patient has identified and corrected dietary factors (soda intake) that were likely contributing to worsened A1C, do not feel we need to escalate pharmacotherapy today. In the future, could consider retrial of low-dose XR metformin . Unfortunately, she does not qualify for LIBERATE.  - Last UACR Aug 2024 - 59 mg/g - Reviewed long term cardiovascular and renal outcomes of uncontrolled blood sugar - Reviewed goal A1c, goal fasting, and goal 2 hour post prandial glucose - Reviewed dietary modifications including utilizing the healthy plate method, limiting portion size of carbohydrate foods, increasing intake of protein and non-starchy vegetables. Counseled patient to stay hydrated with water  throughout the day. Commended patient for avoiding soda for the past 7 days. - Reviewed lifestyle modifications including: aiming for 150 minutes of moderate intensity exercise every week.  - Recommend to continue Ozempic  2 mg weekly, glipizide  5 mg BID, Farxiga  10 mg daily - Advised to purchase affordable OTC glucometer like ReliON or True Metrix. - Next A1C due 09/22/23       Hyperlipidemia/ASCVD Risk Reduction: - Currently uncontrolled with most recent LDL-C of 60 mg/dL above goal < 55 mg/dL given premature ASCVD. Was taking max-tolerated statin and  ezetimibe  prior to last lipid panel. Patient would benefit from [facility]. Agreeable to starting Repatha. PA was approved 06/21/24. - Reviewed long term complications of uncontrolled cholesterol - Reviewed lifestyle recommendations to lower LDL-C including regular physical activity, 5-10% weight loss, eating a diet low in saturated fat, and increasing intake of fiber to at least 25 g per day. - Recommend to continue rosuvastatin  40 mg daily, ezetimibe  10 mg daily - Recommend to start Repatha 140 mg every 14 days. Provided in person education on administration today. Educated on possible side effects. Plan to repeat lipid panel 6-8 weeks after initiation of Repatha.      Heart Failure: - Currently appropriately managed. Encouraged follow-up with cardiology as scheduled given patient reported LEE. If BP uncontrolled, would prefer to switch to more potent ARB or Entresto  for further titration. - Reviewed appropriate blood pressure monitoring technique and reviewed goal blood pressure - Reviewed to weigh daily and when to contact cardiology with weight gain - Recommend to continue current regimen: Current medications:  ACEi/ARB/ARNI: losartan  50 mg daily SGLT2i: Farxiga  10 mg daily Beta blocker: none Mineralocorticoid Receptor Antagonist: spironolactone  25 mg daily Diuretic regimen: torsemide  20 mg  BID   Follow-up:  Pharmacist 07/26/24. PCP clinic visit in 3 mo   Lorain Baseman, PharmD [facility] [phone]

## 2024-06-22 ENCOUNTER — Ambulatory Visit: Payer: Self-pay | Admitting: Nurse Practitioner

## 2024-06-22 LAB — CMP14+EGFR
ALT: 19 IU/L (ref 0–32)
AST: 18 IU/L (ref 0–40)
Albumin: 4.1 g/dL (ref 3.9–4.9)
Alkaline Phosphatase: 70 IU/L (ref 41–116)
BUN/Creatinine Ratio: 17 (ref 9–23)
BUN: 17 mg/dL (ref 6–24)
Bilirubin Total: 0.3 mg/dL (ref 0.0–1.2)
CO2: 27 mmol/L (ref 20–29)
Calcium: 9.6 mg/dL (ref 8.7–10.2)
Chloride: 101 mmol/L (ref 96–106)
Creatinine, Ser: 1.01 mg/dL — ABNORMAL HIGH (ref 0.57–1.00)
Globulin, Total: 3.6 g/dL (ref 1.5–4.5)
Glucose: 80 mg/dL (ref 70–99)
Potassium: 3.8 mmol/L (ref 3.5–5.2)
Sodium: 140 mmol/L (ref 134–144)
Total Protein: 7.7 g/dL (ref 6.0–8.5)
eGFR: 69 mL/min/1.73 (ref >59)

## 2024-06-22 LAB — CBC
Hematocrit: 35.9 % (ref 34.0–46.6)
Hemoglobin: 11.4 g/dL (ref 11.1–15.9)
MCH: 27.9 pg (ref 26.6–33.0)
MCHC: 31.8 g/dL (ref 31.5–35.7)
MCV: 88 fL (ref 79–97)
Platelets: 338 x10E3/uL (ref 150–450)
RBC: 4.09 x10E6/uL (ref 3.77–5.28)
RDW: 12.8 % (ref 11.7–15.4)
WBC: 5.6 x10E3/uL (ref 3.4–10.8)

## 2024-06-22 LAB — LIPID PANEL
Chol/HDL Ratio: 3.3 ratio (ref 0.0–4.4)
Cholesterol, Total: 103 mg/dL (ref 100–199)
HDL: 31 mg/dL — ABNORMAL LOW (ref >39)
LDL Chol Calc (NIH): 60 mg/dL (ref 0–99)
Triglycerides: 47 mg/dL (ref 0–149)
VLDL Cholesterol Cal: 12 mg/dL (ref 5–40)

## 2024-06-22 LAB — MICROALBUMIN / CREATININE URINE RATIO
Creatinine, Urine: 102.3 mg/dL
Microalb/Creat Ratio: 94 mg/g{creat} — ABNORMAL HIGH (ref 0–29)
Microalbumin, Urine: 96.6 ug/mL

## 2024-06-22 NOTE — Progress Notes (Deleted)
  Cardiology Office Note   Date:  06/22/2024  ID:  Tiffany Le, DOB 1976/04/16, MRN [id number] PCP: Paseda, Folashade R, FNP  [facility] Providers Cardiologist:  Lurena MARLA Red, MD { Click to update primary MD,subspecialty MD or APP then REFRESH:1}    History of Present Illness Tiffany Le is a 48 y.o. female with a past medical history of heart failure, CAD s/p CABG x 4, history of stroke, hypertension, OSA, DM2, dyslipidemia, mild bilateral carotid artery stenosis  01/21/2023 echo EF 55 to 60%, mild concentric LVH, LA mildly dilated, trivial MR 08/31/2021 echo bubble study negative 09/09/2021 echo EF 66 5%, mild LVH, grade 2 DD 10/09/2020 CABG x 4 10/03/2020 carotid duplex mild bilateral carotid artery stenosis 2/17/2022EF 35 to 40%, positive RWMA, mild LVH, mildly elevated PASP of 44 mmHg, trivial MR, mild aortic  valve sclerosis present without stenosis 09/25/2020 left heart cath severe multivessel CAD   Check LPA ROS: ***  Studies Reviewed      *** Risk Assessment/Calculations {Does this patient have ATRIAL FIBRILLATION?:[phone]}     STOP-Bang Score:  5  { Consider Dx Sleep Disordered Breathing or Sleep Apnea  ICD G47.33          :1}    Physical Exam VS:  LMP 05/11/2024 (Approximate)        Wt Readings from Last 3 Encounters:  06/21/24 266 lb (120.7 kg)  04/26/24 263 lb (119.3 kg)  02/26/24 264 lb (119.7 kg)    GEN: Well nourished, well developed in no acute distress NECK: No JVD; No carotid bruits CARDIAC: ***RRR, no murmurs, rubs, gallops RESPIRATORY:  Clear to auscultation without rales, wheezing or rhonchi  ABDOMEN: Soft, non-tender, non-distended EXTREMITIES:  No edema; No deformity   ASSESSMENT AND PLAN ***    {Are you ordering a CV Procedure (e.g. stress test, cath, DCCV, TEE, etc)?   Press F2        :[id number]}  Dispo: ***  Signed, Delon JAYSON Hoover, NP

## 2024-06-23 NOTE — Progress Notes (Signed)
 Complex Care Management Note  Care Guide Note 06/23/2024 Name: Tiffany Le MRN: [id number] DOB: 07/21/1976  Tiffany Le is a 48 y.o. year old female who sees Paseda, Folashade R, FNP for primary care. I reached out to [name] by phone today to offer complex care management services.  [name] was given information about Complex Care Management services today including:   The Complex Care Management services include support from the care team which includes your Nurse Care Manager, Clinical Social Worker, or Pharmacist.  The Complex Care Management team is here to help remove barriers to the health concerns and goals most important to you. Complex Care Management services are voluntary, and the patient may decline or stop services at any time by request to their care team member.   Complex Care Management Consent Status: Patient agreed to services and verbal consent obtained.   Follow up plan:  Telephone appointment with complex care management team member scheduled for:  07/25/24 @ 1 PM  Encounter Outcome:  Patient Scheduled  Leotis Rase [facility], [facility] Guide  Direct Dial: [phone]  Fax [phone]

## 2024-06-24 ENCOUNTER — Telehealth: Payer: Self-pay

## 2024-06-24 ENCOUNTER — Ambulatory Visit: Admitting: Cardiology

## 2024-06-24 NOTE — Telephone Encounter (Signed)
 Copied from CRM #[id number]. Topic: Clinical - Prescription Issue >> Jun 24, 2024 10:43 AM Deaijah H wrote: Reason for CRM: Patient is stating [facility] does not have her prescription Evolocumab (REPATHA SURECLICK) 140 MG/ML  Spoke to pharmacy and they advised it will be in on Monday.   Suzen Shove   CMA II

## 2024-06-28 ENCOUNTER — Ambulatory Visit: Admitting: Physician Assistant

## 2024-06-28 NOTE — Progress Notes (Deleted)
  Cardiology Office Note   Date:  06/28/2024  ID:  [name], DOB 1975/09/19, MRN [id number] PCP: Paseda, Folashade R, FNP  [facility] Providers Cardiologist:  Lurena MARLA Red, MD { Click to update primary MD,subspecialty MD or APP then REFRESH:1}    History of Present Illness Tiffany Le is a 48 y.o. female ***  ROS: ***  Studies Reviewed      *** Risk Assessment/Calculations {Does this patient have ATRIAL FIBRILLATION?:[phone]} No BP recorded.  {Refresh Note OR Click here to enter BP  :1}***   STOP-Bang Score:  5  { Consider Dx Sleep Disordered Breathing or Sleep Apnea  ICD G47.33          :1}    Physical Exam VS:  LMP 05/11/2024 (Approximate)        Wt Readings from Last 3 Encounters:  06/21/24 266 lb (120.7 kg)  04/26/24 263 lb (119.3 kg)  02/26/24 264 lb (119.7 kg)    GEN: Well nourished, well developed in no acute distress NECK: No JVD; No carotid bruits CARDIAC: ***RRR, no murmurs, rubs, gallops RESPIRATORY:  Clear to auscultation without rales, wheezing or rhonchi  ABDOMEN: Soft, non-tender, non-distended EXTREMITIES:  No edema; No deformity   ASSESSMENT AND PLAN ***    {Are you ordering a CV Procedure (e.g. stress test, cath, DCCV, TEE, etc)?   Press F2        :[id number]}  Dispo: ***  Signed, Scot Ford, PA

## 2024-06-30 ENCOUNTER — Inpatient Hospital Stay: Admission: RE | Admit: 2024-06-30 | Source: Ambulatory Visit

## 2024-07-01 ENCOUNTER — Encounter: Payer: Self-pay | Admitting: Nurse Practitioner

## 2024-07-01 ENCOUNTER — Ambulatory Visit: Payer: Self-pay

## 2024-07-01 ENCOUNTER — Ambulatory Visit (INDEPENDENT_AMBULATORY_CARE_PROVIDER_SITE_OTHER): Admitting: Nurse Practitioner

## 2024-07-01 ENCOUNTER — Ambulatory Visit
Admission: EM | Admit: 2024-07-01 | Discharge: 2024-07-01 | Disposition: A | Discharge status: Home / Self Care | Attending: Family Medicine | Admitting: Family Medicine

## 2024-07-01 VITALS — BP 113/79 | HR 78 | Ht 64.0 in | Wt 264.4 lb

## 2024-07-01 DIAGNOSIS — G4733 Obstructive sleep apnea (adult) (pediatric): Secondary | ICD-10-CM | POA: Diagnosis present

## 2024-07-01 DIAGNOSIS — N390 Urinary tract infection, site not specified: Secondary | ICD-10-CM | POA: Insufficient documentation

## 2024-07-01 DIAGNOSIS — E118 Type 2 diabetes mellitus with unspecified complications: Secondary | ICD-10-CM

## 2024-07-01 DIAGNOSIS — I5020 Unspecified systolic (congestive) heart failure: Secondary | ICD-10-CM | POA: Diagnosis present

## 2024-07-01 DIAGNOSIS — R8290 Unspecified abnormal findings in urine: Secondary | ICD-10-CM | POA: Insufficient documentation

## 2024-07-01 DIAGNOSIS — R5383 Other fatigue: Secondary | ICD-10-CM | POA: Insufficient documentation

## 2024-07-01 DIAGNOSIS — R21 Rash and other nonspecific skin eruption: Secondary | ICD-10-CM | POA: Diagnosis present

## 2024-07-01 DIAGNOSIS — Z951 Presence of aortocoronary bypass graft: Secondary | ICD-10-CM | POA: Diagnosis present

## 2024-07-01 DIAGNOSIS — I2510 Atherosclerotic heart disease of native coronary artery without angina pectoris: Secondary | ICD-10-CM

## 2024-07-01 DIAGNOSIS — I6523 Occlusion and stenosis of bilateral carotid arteries: Secondary | ICD-10-CM

## 2024-07-01 DIAGNOSIS — Z794 Long term (current) use of insulin: Secondary | ICD-10-CM

## 2024-07-01 DIAGNOSIS — Z8673 Personal history of transient ischemic attack (TIA), and cerebral infarction without residual deficits: Secondary | ICD-10-CM

## 2024-07-01 LAB — GLUCOSE, POCT (MANUAL RESULT ENTRY): POC Glucose: 113 mg/dL — AB (ref 70–99)

## 2024-07-01 LAB — POCT URINE DIPSTICK
Bilirubin, UA: NEGATIVE
Glucose, UA: 500 mg/dL — AB
Ketones, POC UA: NEGATIVE mg/dL
Nitrite, UA: POSITIVE — AB
POC PROTEIN,UA: 30 — AB
Spec Grav, UA: 1.015 (ref 1.010–1.025)
Urobilinogen, UA: 1 U/dL
pH, UA: 6 (ref 5.0–8.0)

## 2024-07-01 MED ORDER — CIPROFLOXACIN HCL 500 MG PO TABS: 500.0000 mg | ORAL_TABLET | Freq: Two times a day (BID) | ORAL | 0 refills | Status: DC

## 2024-07-01 MED ORDER — ONDANSETRON 4 MG PO TBDP: 4.0000 mg | ORAL_TABLET | Freq: Three times a day (TID) | ORAL | 0 refills | Status: AC | PRN

## 2024-07-01 NOTE — Telephone Encounter (Signed)
 FYI Only or Action Required?: FYI only for provider: UC advised.  Patient was last seen in primary care on 06/21/2024 by Paseda, Folashade R, FNP.  Called Nurse Triage reporting Dizziness.  Symptoms began a week ago.  Interventions attempted: Rest, hydration, or home remedies.  Symptoms are: gradually improving.  Triage Disposition: See Physician Within 24 Hours  Patient/caregiver understands and will follow disposition?: Yes    Copied from CRM #[id number]. Topic: Clinical - Red Word Triage >> Jul 01, 2024 11:48 AM Amy B wrote: Red Word that prompted transfer to Nurse Triage: Nausea, dizziness, rash, extreme fatigue Reason for Disposition  [1] MODERATE dizziness (e.g., interferes with normal activities) AND [2] has NOT been evaluated by doctor (or NP/PA) for this  (Exception: Dizziness caused by heat exposure, sudden standing, or poor fluid intake.)  Answer Assessment - Initial Assessment Questions Patient states that she has been having this intermittent rash for about a month and it is getting worse. She states that is it now on her legs, she was seen in UC for it and they gave her a cream. She states she has also been having intermittent dizziness. She states mostly when she gets out of bed, feels like her legs feel like spaghetti noodles and feels unstable. She states she has been drinking lots of water . She states she is having shortness of breath worse than normal, feels like sometimes she doesn't have the energy to talk. States it is worse with laying down or exertion. Pt states she is also having generalized weakness and feels very tired. She did state she is having hand numbness that is being evaluated by a neurologist and this is normal for her- no acute change. She states that she did have a menstrual cycle that lasted 17 days which is abnormal for her and had a couple of large clots with it. She states dizziness is better today. She felt a couple of days ago like she couldn't drive  but can drive today. States no shortness of breath currently. Denies chest pain. Rn advised due to schedule availability for pt to go to [facility] or ED for evaluation since these symptoms have been ongoing for about a week.  Pt is agreeable.     1. DESCRIPTION: Describe your dizziness.     Feels weak, dizzy with standing 2. LIGHTHEADED: Do you feel lightheaded? (e.g., somewhat faint, woozy, weak upon standing)     Yes  3. VERTIGO: Do you feel like either you or the room is spinning or tilting? (i.e., vertigo)     no 4. SEVERITY: How bad is it?  Do you feel like you are going to faint? Can you stand and walk?     States sometimes she feels unstable  5. ONSET:  When did the dizziness begin?     About a week 6. AGGRAVATING FACTORS: Does anything make it worse? (e.g., standing, change in head position)     Standing  8. CAUSE: What do you think is causing the dizziness? (e.g., decreased fluids or food, diarrhea, emotional distress, heat exposure, new medicine, sudden standing, vomiting; unknown)     unknown  10. OTHER SYMPTOMS: Do you have any other symptoms? (e.g., fever, chest pain, vomiting, diarrhea, bleeding)       No chest pain, or bleeding, states she does feel tired and weak  Protocols used: Dizziness - Lightheadedness-A-AH

## 2024-07-01 NOTE — ED Provider Notes (Signed)
 [name]    CSN: [id number] Arrival date & time: 07/01/24  1220      History   Chief Complaint Chief Complaint  Patient presents with   Rash   Follow-up    HPI Tiffany Le is a 48 y.o. female.    Rash  Here for fatigue going on since about 11/15. No f/c/cough/congestion. She had a little nausea, helped by the zofran  she already had at home. No dysuria or hematuria. No v/d.  She is allergic to oxycodone  and penicillin.  PMH includes DM, CHF, CAD  She has had a rash mainly on her arms (seen at [facility] ER 10/24.). prescribed triamcinolone . Rash is still bothering her, some what pruritic. Now on her legs as of last night.  Is not checking her sugar  Past Medical History:  Diagnosis Date   CHF (congestive heart failure) (HCC)    Coronary artery disease    CVA (cerebral vascular accident) (HCC) 09/2020   Diabetes mellitus without complication (HCC)    Diabetic neuropathy (HCC) 02/2020   Heart murmur    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   Hyperlipidemia age 16   Hypertension age 43   Hypocalcemia 11/2019   Hypokalemia 11/2019   Iron deficiency anemia    NSTEMI (non-ST elevated myocardial infarction) (HCC) 09/2020   Obesity    Proteinuria 12/09/2019   Pulmonary embolism (HCC) 09/2020    Patient Active Problem List   Diagnosis Date Noted   Abnormal uterine bleeding 06/21/2024   History of stroke 06/21/2024   GAD (generalized anxiety disorder) 06/21/2024   Chronic diastolic heart failure (HCC) 04/26/2024   OSA (obstructive sleep apnea) 04/26/2024   Mass of right wrist 04/26/2024   Major depression, recurrent 04/26/2024   Fatigue 04/26/2024   Hemianopia of left eye 12/01/2023   Left homonymous hemianopsia 11/18/2023   Proteinuria 08/19/2023   Paresthesia 08/27/2022   Diabetes mellitus (HCC) 03/12/2022   Dyslipidemia 11/15/2021   Visit for wound check 01/08/2021   Sternal wound dehiscence 12/14/2020   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)    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   Pulmonary edema 10/02/2020   Acute respiratory failure with hypoxia (HCC)    Coronary artery disease involving native coronary artery of native heart with unstable angina pectoris (HCC)    Acute on chronic combined systolic (congestive) and diastolic (congestive) heart failure (HCC)    NSTEMI (non-ST elevated myocardial infarction) (HCC) 09/25/2020   Poorly controlled type 2 diabetes mellitus with neuropathy (HCC)    Shortness of breath 12/25/2019   Hemoglobin A1C greater than 9%, indicating poor diabetic control 12/25/2019   Sepsis due to urinary tract infection (HCC) 12/03/2019   Sepsis secondary to UTI (HCC) 12/02/2019   Class 3 severe obesity due to excess calories with serious comorbidity and body mass index (BMI) of 40.0 to 44.9 in adult (HCC) 10/24/2018   Back pain 10/24/2018   Elevated LFTs 01/13/2014   Vitamin D  deficiency 01/13/2014   Anemia, iron deficiency 11/17/2012   Essential hypertension, benign 11/17/2012   Pure hypercholesterolemia 11/17/2012    Past Surgical History:  Procedure Laterality Date   APPLICATION OF WOUND VAC N/A 12/14/2020   Procedure: APPLICATION OF WOUND VAC;  Surgeon: Dusty Sudie DEL, MD;  Location: [facility] OR;  Service: Thoracic;  Laterality: N/A;   APPLICATION OF WOUND VAC N/A 12/25/2020   Procedure: WOUND VAC CHANGE;  Surgeon: German Bartlett PEDLAR, MD;  Location: [facility] OR;  Service: Thoracic;  Laterality:  N/A;   CARDIAC CATHETERIZATION  09/25/2020   CESAREAN SECTION  3/96   CLIPPING OF ATRIAL APPENDAGE N/A 10/09/2020   Procedure: CLIPPING OF ATRIAL APPENDAGE USING ATRICURE CLIP;  Surgeon: German Bartlett PEDLAR, MD;  Location: [facility] OR;  Service: Open Heart Surgery;  Laterality: N/A;   CORONARY ARTERY BYPASS GRAFT N/A 10/09/2020   Procedure: CORONARY ARTERY BYPASS GRAFTING (CABG), ON PUMP, TIMES FOUR, USING LEFT INTERNAL MAMMARY ARTERY AND LEFT RADIAL ARTERY (OPEN HARVEST);  Surgeon: German Bartlett PEDLAR,  MD;  Location: [facility] OR;  Service: Open Heart Surgery;  Laterality: N/A;  POSSIBLE BIMA   INCISION AND DRAINAGE OF WOUND N/A 12/20/2020   Procedure: STERNAL WOUND DEBRIDEMENT WITH SKIN SUBSTITUTION AND ABRA;  Surgeon: Lowery Estefana RAMAN, DO;  Location: [facility] OR;  Service: Plastics;  Laterality: N/A;   IR THORACENTESIS ASP PLEURAL SPACE W/IMG GUIDE  10/16/2020   LEFT HEART CATH AND CORONARY ANGIOGRAPHY N/A 09/25/2020   Procedure: LEFT HEART CATH AND CORONARY ANGIOGRAPHY;  Surgeon: Anner Alm ORN, MD;  Location: [facility] INVASIVE CV LAB;  Service: Cardiovascular;  Laterality: N/A;   ORIF FEMUR FRACTURE     RADIAL ARTERY HARVEST Left 10/09/2020   Procedure: RADIAL ARTERY HARVEST;  Surgeon: German Bartlett PEDLAR, MD;  Location: [facility] OR;  Service: Open Heart Surgery;  Laterality: Left;   STERNAL CLOSURE N/A 12/25/2020   Procedure: STERNAL CLOSURE;  Surgeon: German Bartlett PEDLAR, MD;  Location: [facility] OR;  Service: Thoracic;  Laterality: N/A;   STERNAL WOUND DEBRIDEMENT N/A 12/14/2020   Procedure: STERNAL WOUND DEBRIDEMENT;  Surgeon: Dusty Sudie DEL, MD;  Location: [facility] OR;  Service: Thoracic;  Laterality: N/A;   STERNAL WOUND DEBRIDEMENT N/A 12/18/2020   Procedure: STERNAL WOUND DEBRIDEMENT, Wound Vac Change;  Surgeon: German Bartlett PEDLAR, MD;  Location: [facility] OR;  Service: Thoracic;  Laterality: N/A;   STERNAL WOUND DEBRIDEMENT N/A 12/25/2020   Procedure: STERNAL WOUND IRRIGATION AND DEBRIDEMENT;  Surgeon: German Bartlett PEDLAR, MD;  Location: [facility] OR;  Service: Thoracic;  Laterality: N/A;   TEE WITHOUT CARDIOVERSION N/A 10/09/2020   Procedure: TRANSESOPHAGEAL ECHOCARDIOGRAM (TEE);  Surgeon: German Bartlett PEDLAR, MD;  Location: [facility] OR;  Service: Open Heart Surgery;  Laterality: N/A;    OB History     Gravida  1   Para  1   Term      Preterm      AB      Living  1      SAB      IAB      Ectopic      Multiple      Live Births               Home Medications    Prior to Admission medications   Medication Sig Start  Date End Date Taking? Authorizing Provider  ciprofloxacin  (CIPRO ) 500 MG tablet Take 1 tablet (500 mg total) by mouth 2 (two) times daily for 7 days. 07/01/24 07/08/24 Yes Vonna Sharlet POUR, MD  ondansetron  (ZOFRAN -ODT) 4 MG disintegrating tablet Take 1 tablet (4 mg total) by mouth every 8 (eight) hours as needed for nausea or vomiting. 07/01/24  Yes Vonna Sharlet POUR, MD  albuterol  (PROVENTIL  HFA) 108 (90 Base) MCG/ACT inhaler Inhale 2 puffs into the lungs every 6 (six) hours as needed for wheezing. 04/26/24   Paseda, Folashade R, FNP  aspirin  EC 81 MG tablet Take 1 tablet (81 mg total) by mouth daily. 11/24/23   Oley Bascom RAMAN, NP  Blood Glucose Monitoring Suppl (BLOOD GLUCOSE  MONITOR SYSTEM) w/Device KIT Test in the morning, at noon, and at bedtime. 03/26/23   Paseda, Folashade R, FNP  Blood Pressure Monitoring (BLOOD PRESSURE CUFF) MISC Use as directed to monitor blood pressure 06/20/24   Paseda, Folashade R, FNP  dapagliflozin  propanediol (FARXIGA ) 10 MG TABS tablet Take 1 tablet (10 mg total) by mouth daily. 11/24/23   Oley Bascom RAMAN, NP  escitalopram  (LEXAPRO ) 10 MG tablet Take 1 tablet (10 mg total) by mouth daily. 06/21/24   Paseda, Folashade R, FNP  Evolocumab  (REPATHA  SURECLICK) 140 MG/ML SOAJ Inject 140 mg into the skin every 14 (fourteen) days. Patient not taking: No sig reported 06/20/24   Paseda, Folashade R, FNP  ezetimibe  (ZETIA ) 10 MG tablet Take 1 tablet (10 mg total) by mouth daily. 04/26/24   Paseda, Folashade R, FNP  glipiZIDE  (GLUCOTROL ) 5 MG tablet TAKE 1 TABLET(5 MG) BY MOUTH TWICE DAILY BEFORE A MEAL 03/01/24   Nichols, Tonya S, NP  Glucose Blood (BLOOD GLUCOSE TEST STRIPS) STRP Test in the morning, at noon, and at bedtime. 03/26/23   Paseda, Folashade R, FNP  Insulin  Pen Needle (B-D UF III MINI PEN NEEDLES) 31G X 5 MM MISC Use as directed with insulin . 05/15/22   Oley Bascom RAMAN, NP  losartan  (COZAAR ) 50 MG tablet Take 1 tablet (50 mg total) by mouth at bedtime. 11/24/23    Oley Bascom RAMAN, NP  methocarbamol  (ROBAXIN ) 500 MG tablet Take 1 tablet (500 mg total) by mouth every 8 (eight) hours as needed for muscle spasms. 08/14/22   Oley Bascom RAMAN, NP  nitroGLYCERIN  (NITROSTAT ) 0.4 MG SL tablet DISSOLVE 1 TABLET UNDER THE TONGUE EVERY 5 MINUTES FOR 3 DOSES AS NEEDED FOR CHEST PAIN 04/26/24   Paseda, Folashade R, FNP  rosuvastatin  (CRESTOR ) 20 MG tablet Take 2 tablets (40 mg total) by mouth daily. 04/26/24 07/25/24  Paseda, Folashade R, FNP  Semaglutide , 2 MG/DOSE, (OZEMPIC , 2 MG/DOSE,) 8 MG/3ML SOPN Inject 2 mg into the skin once a week. 06/21/24   Paseda, Folashade R, FNP  spironolactone  (ALDACTONE ) 25 MG tablet Take 1 tablet (25 mg total) by mouth daily. 11/24/23   Oley Bascom RAMAN, NP  torsemide  (DEMADEX ) 20 MG tablet TAKE 1 TABLET(20 MG) BY MOUTH TWICE DAILY 05/24/24   Paseda, Folashade R, FNP  traZODone  (DESYREL ) 50 MG tablet Take 0.5-1 tablets (25-50 mg total) by mouth at bedtime as needed for sleep. 11/24/23   Oley Bascom RAMAN, NP  triamcinolone  cream (KENALOG ) 0.1 % Apply 1 Application topically 2 (two) times daily. 06/03/24   Lang Norleen POUR, PA-C    Family History Family History  Problem Relation Age of Onset   Heart disease Mother        CABG <50   Hypertension Mother    Diabetes Mother    Hyperlipidemia Mother    Heart attack Mother    Heart disease Father    Hyperlipidemia Father    Hypertension Father    Early death Father    Healthy Daughter    Diabetes Maternal Aunt    Cancer Maternal Grandmother        stomach cancer   Diabetes Maternal Aunt    Cancer Cousin        colon cancer (dx'd 54)    Social History Social History   Tobacco Use   Smoking status: Never   Smokeless tobacco: Never  Vaping Use   Vaping status: Never Used  Substance Use Topics   Alcohol use: No   Drug use: No  Allergies   Oxycodone  and Penicillins   Review of Systems Review of Systems  Skin:  Positive for rash.     Physical Exam Triage Vital  Signs ED Triage Vitals  Encounter Vitals Group     BP 07/01/24 1237 130/84     Girls Systolic BP Percentile --      Girls Diastolic BP Percentile --      Boys Systolic BP Percentile --      Boys Diastolic BP Percentile --      Pulse Rate 07/01/24 1237 78     Resp 07/01/24 1237 20     Temp 07/01/24 1237 (!) 97.5 F (36.4 C)     Temp Source 07/01/24 1237 Oral     SpO2 07/01/24 1237 97 %     Weight 07/01/24 1235 264 lb (119.7 kg)     Height 07/01/24 1235 5' 4 (1.626 m)     Head Circumference --      Peak Flow --      Pain Score 07/01/24 1231 0     Pain Loc --      Pain Education --      Exclude from Growth Chart --    No data found.  Updated Vital Signs BP 130/84 (BP Location: Left Arm)   Pulse 78   Temp (!) 97.5 F (36.4 C) (Oral)   Resp 20   Ht 5' 4 (1.626 m)   Wt 119.7 kg   LMP 06/11/2024 (Approximate) Comment: Last through 06-27-2024  SpO2 97%   BMI 45.32 kg/m   Visual Acuity Right Eye Distance:   Left Eye Distance:   Bilateral Distance:    Right Eye Near:   Left Eye Near:    Bilateral Near:     Physical Exam Vitals reviewed.  Constitutional:      General: She is not in acute distress.    Appearance: She is not toxic-appearing.  HENT:     Nose: Nose normal.     Mouth/Throat:     Mouth: Mucous membranes are moist.     Pharynx: No oropharyngeal exudate or posterior oropharyngeal erythema.  Eyes:     Extraocular Movements: Extraocular movements intact.     Conjunctiva/sclera: Conjunctivae normal.     Pupils: Pupils are equal, round, and reactive to light.  Cardiovascular:     Rate and Rhythm: Normal rate and regular rhythm.     Heart sounds: No murmur heard. Pulmonary:     Effort: Pulmonary effort is normal. No respiratory distress.     Breath sounds: No stridor. No wheezing, rhonchi or rales.  Musculoskeletal:     Cervical back: Neck supple.  Lymphadenopathy:     Cervical: No cervical adenopathy.  Skin:    Capillary Refill: Capillary refill  takes less than 2 seconds.     Coloration: Skin is not jaundiced or pale.     Findings: Rash (there is an erythematous rash on her left forearm, patchy and slightly raised, like urticaria. On her left anterior leg she has more target lesions. see photos) present.  Neurological:     General: No focal deficit present.     Mental Status: She is alert and oriented to person, place, and time.  Psychiatric:        Behavior: Behavior normal.    Left forearm/elbow   Left shin    UC Treatments / Results  Labs (all labs ordered are listed, but only abnormal results are displayed) Labs Reviewed  POCT URINE DIPSTICK - Abnormal; Notable  for the following components:      Result Value   Clarity, UA cloudy (*)    Glucose, UA =500 (*)    Blood, UA small (*)    POC PROTEIN,UA =30 (*)    Nitrite, UA Positive (*)    Leukocytes, UA Large (3+) (*)    All other components within normal limits  GLUCOSE, POCT (MANUAL RESULT ENTRY) - Abnormal; Notable for the following components:   POC Glucose 113 (*)    All other components within normal limits  URINE CULTURE  COMPREHENSIVE METABOLIC PANEL WITH GFR  CBC WITH DIFFERENTIAL/PLATELET  C-REACTIVE PROTEIN  SEDIMENTATION RATE  ANTINUCLEAR ANTIBODIES, IFA  RHEUMATOID FACTOR  LYME DISEASE SEROLOGY W/REFLEX    EKG   Radiology No results found.  Procedures Procedures (including critical care time)  Medications Ordered in UC Medications - No data to display  Initial Impression / Assessment and Plan / UC Course  I have reviewed the triage vital signs and the nursing notes.  Pertinent labs & imaging results that were available during my care of the patient were reviewed by me and considered in my medical decision making (see chart for details).      FS glucose is 113.  UA shows lg amount of leuks, some blood and positive nitrites. 500 mg % glucose.  CBC, cmp, sed rate, crp, ANA, RF drawn today to further evaluate the rash and her  symptoms.  Cipro  sent in for ?UTI. Urine culture is sent.  To the ER if worse.  Final Clinical Impressions(s) / UC Diagnoses   Final diagnoses:  Fatigue, unspecified type  Abnormal urinalysis  Urinary tract infection without hematuria, site unspecified  Rash and nonspecific skin eruption     Discharge Instructions      Your sugar was 113.    Urinalysis shows some white cells, red blood cells and nitrites, which could mean you have a urine infection.  We have drawn blood to check blood counts, chemistries and electrolytes, and markers for inflammation, including Lyme disease titers.  Staff will notify you if there is anything significantly abnormal  Take Cipro  500 mg--1 tablet 2 times daily for 7 days  Ondansetron  dissolved in the mouth every 8 hours as needed for nausea or vomiting.  Make sure you are drinking plenty of fluids  Please go to the emergency room if you feel worse.  Follow-up with your primary care       ED Prescriptions     Medication Sig Dispense Auth. Provider   ondansetron  (ZOFRAN -ODT) 4 MG disintegrating tablet Take 1 tablet (4 mg total) by mouth every 8 (eight) hours as needed for nausea or vomiting. 10 tablet Vonna Sharlet POUR, MD   ciprofloxacin  (CIPRO ) 500 MG tablet Take 1 tablet (500 mg total) by mouth 2 (two) times daily for 7 days. 14 tablet Darrnell Mangiaracina K, MD      PDMP not reviewed this encounter.   Vonna Sharlet POUR, MD 07/01/24 1344

## 2024-07-01 NOTE — Discharge Instructions (Addendum)
 Your sugar was 113.    Urinalysis shows some white cells, red blood cells and nitrites, which could mean you have a urine infection.  We have drawn blood to check blood counts, chemistries and electrolytes, and markers for inflammation, including Lyme disease titers.  Staff will notify you if there is anything significantly abnormal  Take Cipro  500 mg--1 tablet 2 times daily for 7 days  Ondansetron  dissolved in the mouth every 8 hours as needed for nausea or vomiting.  Make sure you are drinking plenty of fluids  Please go to the emergency room if you feel worse.  Follow-up with your primary care

## 2024-07-01 NOTE — Patient Instructions (Addendum)
 Medication Instructions:  Your physician recommends that you continue on your current medications as directed. Please refer to the Current Medication list given to you today.  *If you need a refill on your cardiac medications before your next appointment, please call your pharmacy*  Lab Work: NONE ordered at this time of appointment   Testing/Procedures: NONE ordered at this time of appointment   Follow-Up: At [facility], you and your health needs are our priority.  As part of our continuing mission to provide you with exceptional heart care, our providers are all part of one team.  This team includes your primary Cardiologist (physician) and Advanced Practice Providers or APPs (Physician Assistants and Nurse Practitioners) who all work together to provide you with the care you need, when you need it.  Your next appointment:   3 month(s) Dr. Thukkani or E. Monge NP Dr. Shlomo- sleep apt Provider:   Arun K Thukkani, MD or Damien Braver, NP          We recommend signing up for the patient portal called MyChart.  Sign up information is provided on this After Visit Summary.  MyChart is used to connect with patients for Virtual Visits (Telemedicine).  Patients are able to view lab/test results, encounter notes, upcoming appointments, etc.  Non-urgent messages can be sent to your provider as well.   To learn more about what you can do with MyChart, go to [email or web address].

## 2024-07-01 NOTE — ED Triage Notes (Signed)
 Patient here reporting symptoms beginning about a month ago for rash (seen here at Urgent Care for this), this past week very fatigued, dizziness (on/off), decreased PO's and not sleeping well. Seems to be worse this week. She was able to contact PCP but there Triage nurse recommended Urgent Care due to no appoints in the time frame needed. No chest pain. No sob.

## 2024-07-01 NOTE — Progress Notes (Unsigned)
 Office Visit    Patient Name: Tiffany Le Date of Encounter: 07/01/2024  Primary Care Provider:  Paseda, Folashade R, FNP Primary Cardiologist:  Arun K Thukkani, MD  Chief Complaint    48 year old female with a history of CAD s/p CABG x 4 (LIMA-LAD, RIMA-PDA, SVG-OM1, SVG-OM 2) in 09/2020, heart failure with improved EF, mild bilateral carotid artery stenosis, CVA, hypertension, hyperlipidemia, type 2 diabetes, anemia, OSA, and obesity who presents for follow-up related to CAD.  Past Medical History    Past Medical History:  Diagnosis Date   CHF (congestive heart failure) (HCC)    Coronary artery disease    CVA (cerebral vascular accident) (HCC) 09/2020   Diabetes mellitus without complication (HCC)    Diabetic neuropathy (HCC) 02/2020   Heart murmur    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   Hyperlipidemia age 2   Hypertension age 70   Hypocalcemia 11/2019   Hypokalemia 11/2019   Iron deficiency anemia    NSTEMI (non-ST elevated myocardial infarction) (HCC) 09/2020   Obesity    Proteinuria 12/09/2019   Pulmonary embolism (HCC) 09/2020   Past Surgical History:  Procedure Laterality Date   APPLICATION OF WOUND VAC N/A 12/14/2020   Procedure: APPLICATION OF WOUND VAC;  Surgeon: Dusty Sudie DEL, MD;  Location: [facility] OR;  Service: Thoracic;  Laterality: N/A;   APPLICATION OF WOUND VAC N/A 12/25/2020   Procedure: WOUND VAC CHANGE;  Surgeon: German Bartlett PEDLAR, MD;  Location: [facility] OR;  Service: Thoracic;  Laterality: N/A;   CARDIAC CATHETERIZATION  09/25/2020   CESAREAN SECTION  3/96   CLIPPING OF ATRIAL APPENDAGE N/A 10/09/2020   Procedure: CLIPPING OF ATRIAL APPENDAGE USING ATRICURE CLIP;  Surgeon: German Bartlett PEDLAR, MD;  Location: [facility] OR;  Service: Open Heart Surgery;  Laterality: N/A;   CORONARY ARTERY BYPASS GRAFT N/A 10/09/2020   Procedure: CORONARY ARTERY BYPASS GRAFTING (CABG), ON PUMP, TIMES FOUR, USING LEFT INTERNAL MAMMARY ARTERY AND LEFT RADIAL ARTERY (OPEN HARVEST);  Surgeon: German Bartlett PEDLAR, MD;  Location: [facility] OR;  Service: Open Heart Surgery;  Laterality: N/A;  POSSIBLE BIMA   INCISION AND DRAINAGE OF WOUND N/A 12/20/2020   Procedure: STERNAL WOUND DEBRIDEMENT WITH SKIN SUBSTITUTION AND ABRA;  Surgeon: Lowery Estefana RAMAN, DO;  Location: [facility] OR;  Service: Plastics;  Laterality: N/A;   IR THORACENTESIS ASP PLEURAL SPACE W/IMG GUIDE  10/16/2020   LEFT HEART CATH AND CORONARY ANGIOGRAPHY N/A 09/25/2020   Procedure: LEFT HEART CATH AND CORONARY ANGIOGRAPHY;  Surgeon: Anner Alm ORN, MD;  Location: [facility] INVASIVE CV LAB;  Service: Cardiovascular;  Laterality: N/A;   ORIF FEMUR FRACTURE     RADIAL ARTERY HARVEST Left 10/09/2020   Procedure: RADIAL ARTERY HARVEST;  Surgeon: German Bartlett PEDLAR, MD;  Location: [facility] OR;  Service: Open Heart Surgery;  Laterality: Left;   STERNAL CLOSURE N/A 12/25/2020   Procedure: STERNAL CLOSURE;  Surgeon: German Bartlett PEDLAR, MD;  Location: [facility] OR;  Service: Thoracic;  Laterality: N/A;   STERNAL WOUND DEBRIDEMENT N/A 12/14/2020   Procedure: STERNAL WOUND DEBRIDEMENT;  Surgeon: Dusty Sudie DEL, MD;  Location: [facility] OR;  Service: Thoracic;  Laterality: N/A;   STERNAL WOUND DEBRIDEMENT N/A 12/18/2020   Procedure: STERNAL WOUND DEBRIDEMENT, Wound Vac Change;  Surgeon: German Bartlett PEDLAR, MD;  Location: [facility] OR;  Service: Thoracic;  Laterality: N/A;   STERNAL WOUND DEBRIDEMENT N/A 12/25/2020   Procedure: STERNAL WOUND IRRIGATION AND DEBRIDEMENT;  Surgeon: German Bartlett PEDLAR, MD;  Location: [facility] OR;  Service: Thoracic;  Laterality: N/A;   TEE WITHOUT CARDIOVERSION N/A 10/09/2020   Procedure: TRANSESOPHAGEAL ECHOCARDIOGRAM (TEE);  Surgeon: German Bartlett PEDLAR, MD;  Location: [facility] OR;  Service: Open Heart Surgery;  Laterality: N/A;    Allergies  Allergies  Allergen Reactions   Oxycodone  Nausea And Vomiting and Other (See Comments)    Pt states she had dizziness   Penicillins Rash    As a baby     Labs/Other Studies Reviewed    The following studies were reviewed  today: *** Cardiac Studies & Procedures   ______________________________________________________________________________________________ CARDIAC CATHETERIZATION  CARDIAC CATHETERIZATION 09/25/2020  Conclusion  Prox LAD to Mid LAD lesion is 95% stenosed. Mid LAD is 70% stenosed  Dist LAD lesion is 50% stenosed.  Ramus-1 lesion is 95% stenosed with 70% stenosed side branch in Lat Ramus. Beyond the bifurcation, Ramus-2 lesion is 60% stenosis  Prox Cx to Dist Cx lesion is 65% stenosed with 70% stenosed side branch in 1st Mrg.  LPAV lesion is 70% stenosed.  Prox RCA-1 lesion is 60% stenosed.  Prox RCA-2 lesion is 85% stenosed.  There is mild to moderate left ventricular systolic dysfunction. EF estimated 40 to 45%  LV end diastolic pressure is severely elevated. 33 mmHg  There is no aortic valve stenosis.  SUMMARY  Severe multivessel CAD: Early mid LAD 95% followed by 70%, bifurcation high OM1 95%, bifurcation LCx-OM2 (65-70%), small caliber codominant RCA with 85% mid stenosis  ACUTE COMBINED SYSTOLIC AND DIASTOLIC HEART FAILURE  mild to moderate reduced EF of roughly 40 to 45% with apical akinesis as well as anterior wall hypokinesis.  severely elevated LVEDP of 33 mmHg   RECOMMENDATIONS  Restart IV heparin  8 hours after sheath removal.  CVTS consultation given diabetes and multivessel CAD.  PCI options would be LAD stenting of both lesions and then stenting of the high OM/ramus across the sidebranch with provisional PTCA; would treat the LCx and midRCA medically    Alm Clay, MD  Findings Coronary Findings Diagnostic  Dominance: Co-dominant  Left Anterior Descending Prox LAD to Mid LAD lesion is 95% stenosed. Vessel is the culprit lesion. The lesion is focal, eccentric and irregular. Tapers to a focal lesion Mid LAD lesion is 70% stenosed. The lesion is distal to major branch, focal and eccentric. Dist LAD lesion is 50% stenosed. The lesion is segmental  and concentric.  First Diagonal Branch Vessel is small in size.  First Septal Branch Vessel is small in size.  Second Diagonal Branch Vessel is small in size.  Second Septal Branch Vessel is small in size.  Third Diagonal Branch Vessel is small in size.  Third Septal Branch Vessel is small in size.  Ramus Intermedius Vessel is large. Ramus-1 lesion is 95% stenosed with 70% stenosed side branch in Lat Ramus. The lesion is located at the bifurcation and eccentric. Ramus-2 lesion is 60% stenosed. The lesion is eccentric.  Lateral Ramus Intermedius Vessel is small in size.  Left Circumflex Prox Cx to Dist Cx lesion is 65% stenosed with 70% stenosed side branch in 1st Mrg. The lesion is located at the bifurcation and concentric. The lesion is calcified.  First Obtuse Marginal Branch Vessel is small in size.  Left Posterior Atrioventricular Artery LPAV lesion is 70% stenosed. The lesion is eccentric.  Right Coronary Artery Vessel was injected. Vessel is small. There is moderate diffuse disease throughout the vessel. The vessel is mildly calcified. The vessel is moderately tortuous. Prox RCA-1 lesion is 60% stenosed. The lesion is eccentric. Prox RCA-2  lesion is 85% stenosed. The lesion is focal, discrete and irregular.  Acute Marginal Branch Vessel is small in size.  Right Ventricular Branch Vessel is small in size.  Right Posterior Descending Artery Vessel is small in size. There is moderate disease in the vessel.  Intervention  No interventions have been documented.     ECHOCARDIOGRAM  ECHOCARDIOGRAM COMPLETE 01/21/2023  Narrative ECHOCARDIOGRAM REPORT    Patient Name:   Tiffany Le Date of Exam: 01/21/2023 Medical Rec #:  [id number]        Height:       64.0 in Accession #:    [id number]       Weight:       251.0 lb Date of Birth:  [birth year]        BSA:          2.155 m Patient Age:    46 years         BP:           132/84 mmHg Patient Gender: F                 HR:           70 bpm. Exam Location:  [address]  Procedure: 2D Echo, Cardiac Doppler, Color Doppler and 3D Echo  Indications:    R06.00 Dyspnea  History:        Patient has prior history of Echocardiogram examinations, most recent 12/05/2021. Ischemic cardiomyopathy and CHF, CAD and Previous Myocardial Infarction, Prior CABG, Stroke, Signs/Symptoms:Chest Pain and Palpitations; Risk Factors:Diabetes, Dyslipidemia and Family History of Coronary Artery Disease.  Sonographer:    Heather Hawks RDCS Referring Phys: JACKEE DEL DICK  IMPRESSIONS   1. Left ventricular ejection fraction, by estimation, is 55 to 60%. The left ventricle has normal function. The left ventricle has no regional wall motion abnormalities. There is mild concentric left ventricular hypertrophy. Left ventricular diastolic parameters were normal. 2. Right ventricular systolic function is normal. The right ventricular size is normal. 3. Left atrial size was mildly dilated. 4. The mitral valve is normal in structure. Trivial mitral valve regurgitation. No evidence of mitral stenosis. 5. The aortic valve is tricuspid. Aortic valve regurgitation is not visualized. Aortic valve sclerosis/calcification is present, without any evidence of aortic stenosis. 6. The inferior vena cava is normal in size with greater than 50% respiratory variability, suggesting right atrial pressure of 3 mmHg.  FINDINGS Left Ventricle: Left ventricular ejection fraction, by estimation, is 55 to 60%. The left ventricle has normal function. The left ventricle has no regional wall motion abnormalities. The left ventricular internal cavity size was normal in size. There is mild concentric left ventricular hypertrophy. Left ventricular diastolic parameters were normal.  Right Ventricle: The right ventricular size is normal. No increase in right ventricular wall thickness. Right ventricular systolic function is normal.  Left Atrium:  Left atrial size was mildly dilated.  Right Atrium: Right atrial size was normal in size.  Pericardium: There is no evidence of pericardial effusion.  Mitral Valve: The mitral valve is normal in structure. Trivial mitral valve regurgitation. No evidence of mitral valve stenosis.  Tricuspid Valve: The tricuspid valve is normal in structure. Tricuspid valve regurgitation is mild . No evidence of tricuspid stenosis.  Aortic Valve: The aortic valve is tricuspid. Aortic valve regurgitation is not visualized. Aortic valve sclerosis/calcification is present, without any evidence of aortic stenosis.  Pulmonic Valve: The pulmonic valve was normal in structure. Pulmonic valve regurgitation is not visualized. No evidence  of pulmonic stenosis.  Aorta: The aortic root is normal in size and structure.  Venous: The inferior vena cava is normal in size with greater than 50% respiratory variability, suggesting right atrial pressure of 3 mmHg.  IAS/Shunts: No atrial level shunt detected by color flow Doppler.   LEFT VENTRICLE PLAX 2D LVIDd:         4.10 cm   Diastology LVIDs:         2.40 cm   LV e' medial:    7.24 cm/s LV PW:         1.10 cm   LV E/e' medial:  11.9 LV IVS:        1.20 cm   LV e' lateral:   9.62 cm/s LVOT diam:     2.60 cm   LV E/e' lateral: 9.0 LV SV:         80 LV SV Index:   37 LVOT Area:     5.31 cm  3D Volume EF: 3D EF:        61 % LV EDV:       150 ml LV ESV:       58 ml LV SV:        92 ml  RIGHT VENTRICLE RV Basal diam:  4.50 cm RV Mid diam:    3.20 cm RV S prime:     10.00 cm/s TAPSE (M-mode): 1.8 cm  LEFT ATRIUM             Index        RIGHT ATRIUM           Index LA diam:        4.20 cm 1.95 cm/m   RA Area:     18.40 cm LA Vol (A2C):   57.1 ml 26.50 ml/m  RA Volume:   51.10 ml  23.72 ml/m LA Vol (A4C):   75.9 ml 35.23 ml/m LA Biplane Vol: 66.6 ml 30.91 ml/m AORTIC VALVE LVOT Vmax:   70.75 cm/s LVOT Vmean:  47.150 cm/s LVOT VTI:    0.150  m  AORTA Ao Root diam: 3.30 cm Ao Asc diam:  3.30 cm  MITRAL VALVE               TRICUSPID VALVE MV Area (PHT): 3.71 cm    TR Peak grad:   21.2 mmHg MV Decel Time: 205 msec    TR Vmax:        230.00 cm/s MV E velocity: 86.45 cm/s MV A velocity: 67.05 cm/s  SHUNTS MV E/A ratio:  1.29        Systemic VTI:  0.15 m Systemic Diam: 2.60 cm  Toribio Fuel MD Electronically signed by Toribio Fuel MD Signature Date/Time: 01/21/2023/2:30:23 PM    Final   TEE  ECHO INTRAOPERATIVE TEE 10/09/2020  Narrative *INTRAOPERATIVE TRANSESOPHAGEAL REPORT *    Patient Name:   Tiffany Le Date of Exam: 10/09/2020 Medical Rec #:  [id number]        Height:       64.0 in Accession #:    [id number]       Weight:       246.7 lb Date of Birth:  1975/08/26        BSA:          2.14 m Patient Age:    44 years         BP:           125/75 mmHg Patient Gender: F  HR:           81 bpm. Exam Location:  Anesthesiology  Transesophogeal exam was perform intraoperatively during surgical procedure. Patient was closely monitored under general anesthesia during the entirety of examination.  Indications:     Coronary Artery Disease Sonographer:     Lauraine Pilot RDCS Performing Phys: [id number] [facility] Z ATKINS Diagnosing Phys: Juliene Clinton MD  Complications: No known complications during this procedure. POST-OP IMPRESSIONS Overall, there were no significant changes from pre-bypass.  PRE-OP FINDINGS Left Ventricle: The left ventricle has mild-moderately reduced systolic function, with an ejection fraction of 40-45%. The cavity size was normal. There is mildly increased left ventricular wall thickness. Severe hypokinesis of the left ventricular apex. Severe hypokinesis of the left ventricular, mid-apical anterior wall. Severe hypokinesis of the left ventricular, mid-apical anteroseptal wall. There is mild concentric left ventricular hypertrophy.   Right Ventricle: The right ventricle  has normal systolic function. The cavity was normal. There is no increase in right ventricular wall thickness. Right ventricular systolic pressure is normal.  Left Atrium: Left atrial size was normal in size. No left atrial/left atrial appendage thrombus was detected.  Right Atrium: Right atrial size was normal in size.  Interatrial Septum: No atrial level shunt detected by color flow Doppler.  Pericardium: Trivial pericardial effusion is present.  Mitral Valve: The mitral valve is normal in structure. Mitral valve regurgitation is trivial by color flow Doppler.  Tricuspid Valve: The tricuspid valve was normal in structure. Tricuspid valve regurgitation is mild by color flow Doppler.  Aortic Valve: The aortic valve is normal in structure. Aortic valve regurgitation was not visualized by color flow Doppler. There is no stenosis of the aortic valve.   Pulmonic Valve: The pulmonic valve was normal in structure. Pulmonic valve regurgitation is not visualized by color flow Doppler.   +---------------+---------++ RIGHT VENTRICLE          +---------------+---------++ RVSP:          40.2 mmHg +---------------+---------++  +------------+----------+++ RIGHT ATRIUM           +------------+----------+++ RA Pressure:19.00 mmHg +------------+----------+++ +---------------+-----------++ TRICUSPID VALVE            +---------------+-----------++ TR Peak grad:  21.2 mmHg   +---------------+-----------++ TR Vmax:       230.00 cm/s +---------------+-----------++ Estimated RAP: 19.00 mmHg  +---------------+-----------++ RVSP:          40.2 mmHg   +---------------+-----------++   Juliene Clinton MD Electronically signed by Juliene Clinton MD Signature Date/Time: 10/10/2020/8:25:10 PM    Final        ______________________________________________________________________________________________     Recent Labs: 06/21/2024: ALT 19; BUN 17;  Creatinine, Ser 1.01; Hemoglobin 11.4; Platelets 338; Potassium 3.8; Sodium 140  Recent Lipid Panel    Component Value Date/Time   CHOL 103 06/21/2024 1541   TRIG 47 06/21/2024 1541   HDL 31 (L) 06/21/2024 1541   CHOLHDL 3.3 06/21/2024 1541   CHOLHDL 4.4 09/25/2020 1418   VLDL 10 09/25/2020 1418   LDLCALC 60 06/21/2024 1541    History of Present Illness    48 year old female with the above past medical history including CAD s/p CABG x 4 (LIMA-LAD, RIMA-PDA, SVG-OM1, SVG-OM 2) in 09/2020, heart failure with improved EF, mild bilateral carotid artery stenosis, CVA, hypertension, hyperlipidemia, type 2 diabetes, anemia, OSA, and obesity.  Cardiac catheterization in 09/2018 revealed severe multivessel CAD.  Echocardiogram in 2/22 showed EF 35 to 40%, RWMA, mild LVH, mildly elevated PASP, mild aortic valve sclerosis without evidence  of aortic stenosis.  She ultimately underwent CABG x 4 (LIMA-LAD, RIMA-PDA, SVG-OM1, SVG-OM 2) in 09/2020.  Echocardiogram in 08/2021 showed EF improved to 60 to 65%. Negative bubble study in 11/2021.  Most recent echocardiogram in 01/2023 showed EF of 26%, mild concentric LVH, no significant valvular abnormalities.  She was last seen in the office on 07/22/2023 and was stable from a cardiac standpoint.  She denied symptoms concerning for angina.  She noted generalized fatigue.  Sleep study showed evidence of OSA.  She was started on CPAP.  She presents today for follow-up.  Since her last visit  Stable from a cardiac standpoint.  Approximately 1 month ago she noticed a rash that began on her hands and spread to her arms.  She was seen in urgent care for same and was given steroid cream.  She saw her PCP last week and was stable.  She was started on Lexapro  for anxiety.  She has noted a 1 week history of decreased appetite, nausea, mild dizziness, fatigue.  Occasional shortness of breath at rest or with activity.  She denies any chest pain, palpitations, presyncope or syncope..   Denies edema, PND, orthopnea, weight gain.  She has been wearing her CPAP.  She has had some bruising to the bridge of her nose due to her mask.  Will have her establish with Dr. Shlomo (previously followed by Dr. Burnard).  She now has a rash that has spread to her legs bilaterally.  And a ring form.  Recommend follow-up with PCP.  Suspect Lexapro  contributing to symptoms.  Continue to monitor.   She was starting Repatha  per PCP has not yet taken her first dose.  Follow-up in 3 months with cardiology.  Advised her to contact us  sooner for progressive symptoms.  1. CAD:  s/p CABG x 4 (LIMA-LAD, RIMA-PDA, SVG-OM1, SVG-OM 2) in 09/2020. Stable with no anginal symptoms. No indication for ischemic evaluation.   2. Heart failure with improved EF: Echocardiogram in 08/2021 showed EF improved to 60 to 65%. Negative bubble study in 11/2021.  Most recent echocardiogram in 01/2023 showed EF of 26%, mild concentric LVH, no significant valvular abnormalities.  Euvolemic and well compensated on exam.  3. Carotid artery stenosis: CABG Dopplers in 2022 revealed 1-39% BICA stenosis.  Asymptomatic. Consider repeat ultrasound as clinically indicated.  Continue Repatha , Crestor , Zetia .  4. History of CVA:  5. Hypertension: BP well controlled. Continue current antihypertensive regimen.   6. Hyperlipidemia: LDL was 60 in 06/2024.  7. Type 2 diabetes: A1c was 7.7 in 06/2024.  8. OSA/obesity: Started on CPAP.  Will refer to Dr. Shlomo (previously managed by Dr. Burnard).  9. Disposition: Follow-up in    Home Medications    Current Outpatient Medications  Medication Sig Dispense Refill   albuterol  (PROVENTIL  HFA) 108 (90 Base) MCG/ACT inhaler Inhale 2 puffs into the lungs every 6 (six) hours as needed for wheezing. 8.5 g 12   aspirin  EC 81 MG tablet Take 1 tablet (81 mg total) by mouth daily. 90 tablet 1   Blood Glucose Monitoring Suppl (BLOOD GLUCOSE MONITOR SYSTEM) w/Device KIT Test in the morning, at noon, and at  bedtime. 1 kit 0   Blood Pressure Monitoring (BLOOD PRESSURE CUFF) MISC Use as directed to monitor blood pressure 1 each 0   dapagliflozin  propanediol (FARXIGA ) 10 MG TABS tablet Take 1 tablet (10 mg total) by mouth daily. 90 tablet 3   escitalopram  (LEXAPRO ) 10 MG tablet Take 1 tablet (10 mg total) by mouth  daily. 60 tablet 1   ezetimibe  (ZETIA ) 10 MG tablet Take 1 tablet (10 mg total) by mouth daily. 90 tablet 3   glipiZIDE  (GLUCOTROL ) 5 MG tablet TAKE 1 TABLET(5 MG) BY MOUTH TWICE DAILY BEFORE A MEAL 180 tablet 1   Glucose Blood (BLOOD GLUCOSE TEST STRIPS) STRP Test in the morning, at noon, and at bedtime. 100 strip 0   Insulin  Pen Needle (B-D UF III MINI PEN NEEDLES) 31G X 5 MM MISC Use as directed with insulin . 300 each 3   losartan  (COZAAR ) 50 MG tablet Take 1 tablet (50 mg total) by mouth at bedtime. 90 tablet 2   methocarbamol  (ROBAXIN ) 500 MG tablet Take 1 tablet (500 mg total) by mouth every 8 (eight) hours as needed for muscle spasms. 21 tablet 0   naproxen  (NAPROSYN ) 375 MG tablet Take 1 tablet (375 mg total) by mouth 2 (two) times daily. 20 tablet 0   nitroGLYCERIN  (NITROSTAT ) 0.4 MG SL tablet DISSOLVE 1 TABLET UNDER THE TONGUE EVERY 5 MINUTES FOR 3 DOSES AS NEEDED FOR CHEST PAIN 25 tablet 5   ondansetron  (ZOFRAN ) 4 MG tablet Take 1 tablet (4 mg total) by mouth every 8 (eight) hours as needed for nausea or vomiting. 20 tablet 0   rosuvastatin  (CRESTOR ) 20 MG tablet Take 2 tablets (40 mg total) by mouth daily. 180 tablet 3   Semaglutide , 2 MG/DOSE, (OZEMPIC , 2 MG/DOSE,) 8 MG/3ML SOPN Inject 2 mg into the skin once a week. 9 mL 3   spironolactone  (ALDACTONE ) 25 MG tablet Take 1 tablet (25 mg total) by mouth daily. 90 tablet 2   torsemide  (DEMADEX ) 20 MG tablet TAKE 1 TABLET(20 MG) BY MOUTH TWICE DAILY 60 tablet 3   traZODone  (DESYREL ) 50 MG tablet Take 0.5-1 tablets (25-50 mg total) by mouth at bedtime as needed for sleep. 30 tablet 3   triamcinolone  cream (KENALOG ) 0.1 % Apply 1  Application topically 2 (two) times daily. 30 g 0   Evolocumab  (REPATHA  SURECLICK) 140 MG/ML SOAJ Inject 140 mg into the skin every 14 (fourteen) days. (Patient not taking: Reported on 07/01/2024) 6 mL 3   No current facility-administered medications for this visit.     Review of Systems    ***.  All other systems reviewed and are otherwise negative except as noted above.    Physical Exam    VS:  BP 113/79 (BP Location: Left Arm, Patient Position: Sitting, Cuff Size: Large)   Pulse 78   Ht 5' 4 (1.626 m)   Wt 264 lb 6.4 oz (119.9 kg)   LMP 05/11/2024 (Approximate)   SpO2 100%   BMI 45.38 kg/m  , BMI Body mass index is 45.38 kg/m. STOP-Bang Score:  5  { Consider Dx Sleep Disordered Breathing or Sleep Apnea  ICD G47.33          :1}    GEN: Well nourished, well developed, in no acute distress. HEENT: normal. Neck: Supple, no JVD, carotid bruits, or masses. Cardiac: RRR, no murmurs, rubs, or gallops. No clubbing, cyanosis, edema.  Radials/DP/PT 2+ and equal bilaterally.  Respiratory:  Respirations regular and unlabored, clear to auscultation bilaterally. GI: Soft, nontender, nondistended, BS + x 4. MS: no deformity or atrophy. Skin: warm and dry, no rash. Neuro:  Strength and sensation are intact. Psych: Normal affect.  Accessory Clinical Findings    ECG personally reviewed by me today -    - no acute changes.   Lab Results  Component Value Date   WBC 5.6  06/21/2024   HGB 11.4 06/21/2024   HCT 35.9 06/21/2024   MCV 88 06/21/2024   PLT 338 06/21/2024   Lab Results  Component Value Date   CREATININE 1.01 (H) 06/21/2024   BUN 17 06/21/2024   NA 140 06/21/2024   K 3.8 06/21/2024   CL 101 06/21/2024   CO2 27 06/21/2024   Lab Results  Component Value Date   ALT 19 06/21/2024   AST 18 06/21/2024   ALKPHOS 70 06/21/2024   BILITOT 0.3 06/21/2024   Lab Results  Component Value Date   CHOL 103 06/21/2024   HDL 31 (L) 06/21/2024   LDLCALC 60 06/21/2024   TRIG 47  06/21/2024   CHOLHDL 3.3 06/21/2024    Lab Results  Component Value Date   HGBA1C 7.7 (A) 06/21/2024    Assessment & Plan    1.  ***      Damien JAYSON Braver, NP 07/01/2024, 11:03 AM

## 2024-07-02 LAB — C-REACTIVE PROTEIN: CRP: <1 mg/L (ref 0–10)

## 2024-07-03 LAB — URINE CULTURE: Culture: >=100000 — AB

## 2024-07-04 ENCOUNTER — Ambulatory Visit (HOSPITAL_COMMUNITY): Payer: Self-pay | Admitting: Family Medicine

## 2024-07-04 LAB — CBC WITH DIFFERENTIAL/PLATELET
Basophils Absolute: 0 x10E3/uL (ref 0.0–0.2)
Basos: 1 %
EOS (ABSOLUTE): 0 x10E3/uL (ref 0.0–0.4)
Eos: 1 %
Hematocrit: 35.7 % (ref 34.0–46.6)
Hemoglobin: 11.3 g/dL (ref 11.1–15.9)
Immature Grans (Abs): 0 x10E3/uL (ref 0.0–0.1)
Immature Granulocytes: 0 %
Lymphocytes Absolute: 2.1 x10E3/uL (ref 0.7–3.1)
Lymphs: 36 %
MCH: 28.1 pg (ref 26.6–33.0)
MCHC: 31.7 g/dL (ref 31.5–35.7)
MCV: 89 fL (ref 79–97)
Monocytes Absolute: 0.6 x10E3/uL (ref 0.1–0.9)
Monocytes: 10 %
Neutrophils Absolute: 3.1 x10E3/uL (ref 1.4–7.0)
Neutrophils: 52 %
Platelets: 306 x10E3/uL (ref 150–450)
RBC: 4.02 x10E6/uL (ref 3.77–5.28)
RDW: 12.7 % (ref 11.7–15.4)
WBC: 5.9 x10E3/uL (ref 3.4–10.8)

## 2024-07-04 LAB — COMPREHENSIVE METABOLIC PANEL WITH GFR
ALT: 18 IU/L (ref 0–32)
AST: 20 IU/L (ref 0–40)
Albumin: 4.2 g/dL (ref 3.9–4.9)
Alkaline Phosphatase: 76 IU/L (ref 41–116)
BUN/Creatinine Ratio: 22 (ref 9–23)
BUN: 21 mg/dL (ref 6–24)
Bilirubin Total: 0.4 mg/dL (ref 0.0–1.2)
CO2: 25 mmol/L (ref 20–29)
Calcium: 9.2 mg/dL (ref 8.7–10.2)
Chloride: 99 mmol/L (ref 96–106)
Creatinine, Ser: 0.95 mg/dL (ref 0.57–1.00)
Globulin, Total: 3.5 g/dL (ref 1.5–4.5)
Glucose: 99 mg/dL (ref 70–99)
Potassium: 3.7 mmol/L (ref 3.5–5.2)
Sodium: 139 mmol/L (ref 134–144)
Total Protein: 7.7 g/dL (ref 6.0–8.5)
eGFR: 74 mL/min/1.73 (ref >59)

## 2024-07-04 LAB — ANTINUCLEAR ANTIBODIES, IFA: ANA Titer 1: POSITIVE — AB

## 2024-07-04 LAB — FANA STAINING PATTERNS: Speckled Pattern: 1:80 {titer}

## 2024-07-04 LAB — LYME DISEASE SEROLOGY W/REFLEX: Lyme Total Antibody EIA: NEGATIVE

## 2024-07-04 LAB — SEDIMENTATION RATE: Sed Rate: 50 mm/h — ABNORMAL HIGH (ref 0–32)

## 2024-07-04 LAB — RHEUMATOID FACTOR: Rheumatoid fact SerPl-aCnc: <10 [IU]/mL (ref <14.0)

## 2024-07-05 ENCOUNTER — Telehealth (INDEPENDENT_AMBULATORY_CARE_PROVIDER_SITE_OTHER): Payer: Self-pay | Admitting: Nurse Practitioner

## 2024-07-05 ENCOUNTER — Ambulatory Visit: Payer: Self-pay | Admitting: Nurse Practitioner

## 2024-07-05 ENCOUNTER — Encounter: Payer: Self-pay | Admitting: Nurse Practitioner

## 2024-07-05 DIAGNOSIS — N3001 Acute cystitis with hematuria: Secondary | ICD-10-CM | POA: Insufficient documentation

## 2024-07-05 DIAGNOSIS — R7689 Other specified abnormal immunological findings in serum: Secondary | ICD-10-CM | POA: Insufficient documentation

## 2024-07-05 DIAGNOSIS — R21 Rash and other nonspecific skin eruption: Secondary | ICD-10-CM | POA: Insufficient documentation

## 2024-07-05 MED ORDER — CIPROFLOXACIN HCL 500 MG PO TABS: 500.0000 mg | ORAL_TABLET | Freq: Two times a day (BID) | ORAL | 0 refills | Status: AC

## 2024-07-05 MED ORDER — TRIAMCINOLONE ACETONIDE 0.5 % EX OINT: TOPICAL_OINTMENT | CUTANEOUS | 0 refills | Status: AC

## 2024-07-05 NOTE — Patient Instructions (Signed)
 1. Rash and other nonspecific skin eruption (Primary)  - triamcinolone  ointment (KENALOG ) 0.5 %; Apply  twice daily to affected areas for 1 to 2 weeks. Do not use on the face  Dispense: 30 g; Refill: 0 - Ambulatory referral to Dermatology  2. Acute cystitis without hematuria  - ciprofloxacin  (CIPRO ) 500 MG tablet; Take 1 tablet (500 mg total) by mouth 2 (two) times daily for 7 days.  Dispense: 14 tablet; Refill: 0   The tablets  can be broken in half on the scored line.  Ensure adequate hydration during ciprofloxacin  therapy   Avoid taking ciprofloxacin  with dairy products or calcium -fortified juices alone   It is important that you exercise regularly at least 30 minutes 5 times a week as tolerated  Think about what you will eat, plan ahead. Choose  clean, green, fresh or frozen over canned, processed or packaged foods which are more sugary, salty and fatty. 70 to 75% of food eaten should be vegetables and fruit. Three meals at set times with snacks allowed between meals, but they must be fruit or vegetables. Aim to eat over a 12 hour period , example 7 am to 7 pm, and STOP after  your last meal of the day. Drink water ,generally about 64 ounces per day, no other drink is as healthy. Fruit juice is best enjoyed in a healthy way, by EATING the fruit.  Thanks for choosing [facility] we consider it a privelige to serve you.

## 2024-07-05 NOTE — Progress Notes (Signed)
 Virtual Visit via Video  Note  I connected with Tiffany Le @ on 07/05/24 at  9:20 AM EST by video and verified that I am speaking with the correct person using two identifiers. I spent 9 minutes on this video encounter   Location: Patient: home Provider: office   I discussed the limitations, risks, security and privacy concerns of performing an evaluation and management service by telephone and the availability of in person appointments. I also discussed with the patient that there may be a patient responsible charge related to this service. The patient expressed understanding and agreed to proceed.   History of Present Illness: Discussed the use of AI scribe software for clinical note transcription with the patient, who gave verbal consent to proceed.  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 who presents with a spreading rash and difficulty swallowing prescribed antibiotics for UTI.  She has been experiencing a rash for over a month, which initially was localized but has now spread to her legs and ankles. The rash is itchy and consists of small red spots. She has attempted to use a triamcinolone  0.1% cream for relief, but it has not been effective.  She was prescribed Ciprofloxacin  500 mg twice daily at an urgent care visit ON 07/01/2024. However, she is unable to swallow the large tablets and has requested a smaller or alternative form of the medication. She  has discarded the tablets due to their size. No burning sensation during urination, increased urination, or pain, but she notes cloudy urine      Assessment & Plan      Observations/Objective: Patient alert and oriented , no sign of distress noted  Assessment and Plan: Rash and other nonspecific skin eruption Rash of lower extremities Rash on legs and ankles, itchy and red. Kenalog  cream ineffective. - Prescribed Kenalog  0.5% topical cream twice daily for 1-2 weeks. Avoid scratching site to prevent infection - Referred to [facility] for further evaluation.   Elevated antinuclear antibody (ANA) level Noted to be elevated on recent labs done at the urgent care , will repeat labs at next visit  Acute cystitis with hematuria Urinary tract infection Confirmed by urine culture. Symptoms: cloudy urine, hematuria. Cipro  500 mg not taken due to swallowing difficulty. - Prescribed Cipro  500 mg immediate release, instructed to split tablet for easier swallowing. - Advised 7-day course completion. - Instructed to stay hydrated, avoid Cipro  with milk or calcium -fortified juices.   Follow Up Instructions:    I discussed the assessment and treatment plan with the patient. The patient was provided an opportunity to ask questions and all were answered. The patient agreed with the plan and demonstrated an understanding of the instructions.   The patient was advised to call back or seek an in-person evaluation if the symptoms worsen or if the condition fails to improve as anticipated.

## 2024-07-05 NOTE — Assessment & Plan Note (Signed)
 Urinary tract infection Confirmed by urine culture. Symptoms: cloudy urine, hematuria. Cipro  500 mg not taken due to swallowing difficulty. - Prescribed Cipro  500 mg immediate release, instructed to split tablet for easier swallowing. - Advised 7-day course completion. - Instructed to stay hydrated, avoid Cipro  with milk or calcium -fortified juices.

## 2024-07-05 NOTE — Assessment & Plan Note (Signed)
 Noted to be elevated on recent labs done at the urgent care , will repeat labs at next visit

## 2024-07-05 NOTE — Assessment & Plan Note (Signed)
 Rash of lower extremities Rash on legs and ankles, itchy and red. Kenalog  cream ineffective. - Prescribed Kenalog  0.5% topical cream twice daily for 1-2 weeks. Avoid scratching site to prevent infection - Referred to [facility] for further evaluation.

## 2024-07-06 ENCOUNTER — Ambulatory Visit: Admitting: Adult Health

## 2024-07-12 ENCOUNTER — Encounter: Payer: Self-pay | Admitting: Adult Health

## 2024-07-12 ENCOUNTER — Ambulatory Visit: Admitting: Adult Health

## 2024-07-12 VITALS — BP 131/73 | HR 77 | Ht 64.0 in | Wt 267.0 lb

## 2024-07-12 DIAGNOSIS — R202 Paresthesia of skin: Secondary | ICD-10-CM | POA: Diagnosis not present

## 2024-07-12 DIAGNOSIS — H53462 Homonymous bilateral field defects, left side: Secondary | ICD-10-CM

## 2024-07-12 DIAGNOSIS — R200 Anesthesia of skin: Secondary | ICD-10-CM

## 2024-07-12 DIAGNOSIS — I639 Cerebral infarction, unspecified: Secondary | ICD-10-CM

## 2024-07-12 DIAGNOSIS — R42 Dizziness and giddiness: Secondary | ICD-10-CM

## 2024-07-12 DIAGNOSIS — E538 Deficiency of other specified B group vitamins: Secondary | ICD-10-CM

## 2024-07-12 NOTE — Patient Instructions (Addendum)
 Your Plan:  We will check lab work today to ensure no signs of clotting disorder contributing to your strokes  You will be called to complete a CTA head/neck for further stroke evaluation  Will follow up with your cardiologist regarding completion of cardiac monitor to rule out irregular heart rhythm possibly contributing  Will follow up with Dr. Onita regarding thigh numbness if lab work normal  Use wrist splints with any activity that worsens symptoms - if symptoms should progress, consider evaluation by hand specialist     Follow-up will be determined after review about completed       Thank you for coming to see us  at [facility]. I hope we have been able to provide you high quality care today.  You may receive a patient satisfaction survey over the next few weeks. We would appreciate your feedback and comments so that we may continue to improve ourselves and the health of our patients.

## 2024-07-12 NOTE — Progress Notes (Signed)
 Chief Complaint  Patient presents with   Numbness    Rm 8 alone Pt is well, reports ongoing numbness and impaired vision. No new concerns.       ASSESSMENT AND PLAN  Tiffany Le is a 48 y.o. 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  MRI brain 01/2024 remote right occipital stroke not present on 2022 imaging, chronic lacunar infarcts in the cerebral hemispheres and small focus in the anterior pons, all 3 acute on 2022 imaging    Recent lab work showed positive ANA, will proceed with further labs to rule out hypercoagulable state  Consider 30 day cardiac monitor to rule out A fib, has cardiologist, will reach out for assistance  Complete CTA head/neck to complete stroke work up  Continue aspirin  81 mg daily, Crestor  and Zetia  managed/prescribed by PCP/cardiology  Discussed importance of close PCP and cardiology follow-up for aggressive stroke risk factor management including BP goal<130/90, HLD with LDL goal<70 and DM with A1c.<7   Stroke labs 06/2024: LDL 60, A1c 7.7  Vascular risk factor of hypertension, diabetes, OSA on CPAP, CAD s/p CABG 2022, CHF, hyperlipidemia, obesity  Bilateral hand numbness  Bilateral thigh numbness  Prior EMG/NCV showed moderate bilateral carpal tunnel syndrome.  Discussed conservative measures with wrist splints and trying to avoid maneuvers/positions that worsen symptoms.  If symptoms progress, recommend evaluation with hand specialist for possible surgical intervention  Unclear cause of bilateral thigh numbness, present at least over the past year. No weakness. No low back pain. No cauda equina symptoms.  Possible L2-3 radiculopathy but as symptoms stable over the past year and no red flag symptoms, will hold off on advanced lumbar imaging.  Will complete B12 and TSH for further evaluation. If normal, will f/u with Dr. Onita for further recommendations  Positional dizziness  Suspect more in setting of  orthostasis based on symptoms  Discussed conservative measures at home and to follow-up with cardiology for further discussion     Follow-up will be determined after completion of above workup      DIAGNOSTIC DATA (LABS, IMAGING, TESTING) - I reviewed patient records, labs, notes, testing and imaging myself where available.   MRI brain 01/30/2024 IMPRESSION: This MRI of the brain without contrast shows the following: No acute findings. Remote right occipital stroke not present on the 2022 MRI. Chronic lacunar infarctions in the cerebral hemispheres and a small focus in the anterior pons.  All 3 of these were acute on the 2022 MRI.  Echocardiogram 01/21/2023 IMPRESSIONS   1. Left ventricular ejection fraction, by estimation, is 55 to 60%. The  left ventricle has normal function. The left ventricle has no regional  wall motion abnormalities. There is mild concentric left ventricular  hypertrophy. Left ventricular diastolic  parameters were normal.   2. Right ventricular systolic function is normal. The right ventricular  size is normal.   3. Left atrial size was mildly dilated.   4. The mitral valve is normal in structure. Trivial mitral valve  regurgitation. No evidence of mitral stenosis.   5. The aortic valve is tricuspid. Aortic valve regurgitation is not  visualized. Aortic valve sclerosis/calcification is present, without any  evidence of aortic stenosis.   6. The inferior vena cava is normal in size with greater than 50%  respiratory variability, suggesting right atrial pressure of 3 mmHg.          MEDICAL HISTORY:  Update 07/12/2024 JM: Patient returns for  follow-up visit.  Reports overall doing well since prior visit.  Continued left lower visual loss which has been stable.  No new stroke/TIA symptoms.  She remains on aspirin , Crestor  and Zetia  without side effects.  She routinely follows with PCP and cardiology.  She also mentions continued intermittent  bilateral hand numbness. Notes worsening while typing at work, will improve after shaking out hands. Has not been wearing wrist splints. Denies weakness. She also reports continued bilateral anterior thigh numbness.  Previously experiencing numbness/tingling in bilateral lower extremities but this has since subsided since discontinuing metformin  a couple years ago.  Reports numbness in thighs worse after sitting for short duration, will resolve after getting up and moving. Denies low back pain or pain radiating into buttocks/posterior thighs.   She also mentions intermittent dizziness over the past week. Worse upon awakening, will need to sit on edge of bed for a while, feels like she has a difficult time focusing, and will lay back down if symptoms do not subside. Gradually improve while laying down with eyes open, will be worse if she closes her eyes. Does not occur while sitting still, only with position change. No other associated symptoms. Does report prior symptoms of deep heart palpitations which resolved after coughing, this has since greatly improved after cardiac surgery in 2022, occurs rarely now.      UPDATE December 01 2023 Dr. 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 Onita: Consepcion DELENA Le is a 48 year old female, seen in request by her primary care nurse practitioner    Oley President for evaluation of numbness tingling in bilateral hands, legs, initial evaluation was on August 27, 2022.    I reviewed and summarized the referring note. PMHx Asthma HTN DM,  DM Peripheral neuropathy HLD CAD History of pulmonary emboli Left femur fracture    She has longstanding history of disease, diabetic peripheral neuropathy, bilateral lower extremity paresthesia,  Around July 2023, she began to notice intermittent bilateral hand numbness, involving all 5 fingers, woke up noticed fingertips paresthesia, sometimes painful, also wake her up in the middle of the night, she has to shake her hand to make sensation comes back  She denies neck pain, denies gait abnormality, no bowel or bladder incontinence  Laboratory evaluations in January 2024: LDL 70, normal CMP creatinine 1.03, CBC, hemoglobin of 12.4, A1c 6.7        PHYSICAL EXAM:   Vitals:   07/12/24 1338  BP: 131/73  Pulse: 77  Weight: 267 lb (121.1 kg)  Height: 5' 4 (1.626 m)   Body mass index is 45.83 kg/m.  PHYSICAL EXAMNIATION:  Gen: NAD, conversant, well nourised, well groomed                     Cardiovascular: Regular rate rhythm, no peripheral edema, warm, nontender. Eyes: Conjunctivae clear without exudates or hemorrhage Neck: Supple, no carotid bruits. Pulmonary: Clear  to auscultation bilaterally   NEUROLOGICAL EXAM:  MENTAL STATUS: Speech/cognition: Awake, alert, oriented to history taking and casual conversation CRANIAL NERVES: CN II: Left homonymous lower quadrantanopia, pupils are round equal and briskly reactive to light. CN III, IV, VI: extraocular movement are normal. No ptosis. CN V: Facial sensation is intact to light touch CN VII: Face is symmetric with normal eye closure  CN VIII: Hearing is normal to causal conversation. CN IX, X: Phonation is normal. CN XI: Head turning and shoulder shrug are intact  MOTOR: There is no pronator drift of out-stretched arms.  Muscle bulk and tone are normal. Muscle strength is normal.  REFLEXES: Reflexes are 1 and symmetric at the biceps, triceps, knees, and ankles. Plantar responses are flexor.  SENSORY: Intact to light touch throughout all tested extremities  COORDINATION: There is no trunk or limb dysmetria noted.  GAIT/STANCE: Posture is normal. Gait is steady   REVIEW OF SYSTEMS:  Full 14 system review of systems performed and notable only for as above All other review of systems were negative.   ALLERGIES: Allergies  Allergen Reactions   Oxycodone  Nausea And Vomiting and Other (See Comments)    Pt states she had dizziness   Penicillins Rash    As a baby    HOME MEDICATIONS: Current Outpatient Medications  Medication Sig Dispense Refill   albuterol  (PROVENTIL  HFA) 108 (90 Base) MCG/ACT inhaler Inhale 2 puffs into the lungs every 6 (six) hours as needed for wheezing. 8.5 g 12   aspirin  EC 81 MG tablet Take 1 tablet (81 mg total) by mouth daily. 90 tablet 1   Blood Glucose Monitoring Suppl (BLOOD GLUCOSE MONITOR SYSTEM) w/Device KIT Test in the morning, at noon, and at bedtime. 1 kit 0   Blood Pressure Monitoring (BLOOD PRESSURE CUFF) MISC Use as directed to monitor blood pressure 1 each 0   ciprofloxacin  (CIPRO ) 500 MG tablet Take 1 tablet (500 mg total) by mouth 2 (two) times daily for 7 days. 14 tablet 0   dapagliflozin  propanediol (FARXIGA ) 10 MG TABS tablet Take 1 tablet (10 mg total) by mouth daily. 90 tablet 3   escitalopram  (LEXAPRO ) 10 MG tablet Take 1 tablet (10 mg total) by mouth daily. 60 tablet 1   ezetimibe  (ZETIA ) 10 MG tablet Take 1 tablet (10 mg total) by mouth daily. 90 tablet 3   glipiZIDE  (GLUCOTROL ) 5 MG tablet TAKE 1 TABLET(5 MG) BY MOUTH TWICE DAILY BEFORE A MEAL 180 tablet 1   Glucose Blood (BLOOD GLUCOSE TEST STRIPS) STRP Test in the morning, at noon, and at bedtime. 100 strip 0   losartan  (COZAAR ) 50 MG tablet Take 1 tablet (50 mg total) by mouth at bedtime. 90 tablet 2    methocarbamol  (ROBAXIN ) 500 MG tablet Take 1 tablet (500 mg total) by mouth every 8 (eight) hours as needed for muscle spasms. 21 tablet 0   nitroGLYCERIN  (NITROSTAT ) 0.4 MG SL tablet DISSOLVE 1 TABLET UNDER THE TONGUE EVERY 5 MINUTES FOR 3 DOSES AS NEEDED FOR CHEST PAIN 25 tablet 5   ondansetron  (ZOFRAN -ODT) 4 MG disintegrating tablet Take 1 tablet (4 mg total) by mouth every 8 (eight) hours as needed for nausea or vomiting. 10 tablet 0   rosuvastatin  (CRESTOR ) 20 MG tablet Take 2 tablets (40 mg total) by mouth daily. 180 tablet 3   Semaglutide , 2 MG/DOSE, (OZEMPIC , 2 MG/DOSE,) 8 MG/3ML SOPN Inject 2 mg into the skin once a week. 9 mL 3   spironolactone  (ALDACTONE ) 25 MG tablet Take  1 tablet (25 mg total) by mouth daily. 90 tablet 2   torsemide  (DEMADEX ) 20 MG tablet TAKE 1 TABLET(20 MG) BY MOUTH TWICE DAILY 60 tablet 3   traZODone  (DESYREL ) 50 MG tablet Take 0.5-1 tablets (25-50 mg total) by mouth at bedtime as needed for sleep. 30 tablet 3   triamcinolone  ointment (KENALOG ) 0.5 % Apply  twice daily to affected areas for 1 to 2 weeks. Do not use on the face 30 g 0   Evolocumab  (REPATHA  SURECLICK) 140 MG/ML SOAJ Inject 140 mg into the skin every 14 (fourteen) days. (Patient not taking: Reported on 07/12/2024) 6 mL 3   No current facility-administered medications for this visit.    PAST MEDICAL HISTORY: Past Medical History:  Diagnosis Date   CHF (congestive heart failure) (HCC)    Coronary artery disease    CVA (cerebral vascular accident) (HCC) 09/2020   Diabetes mellitus without complication (HCC)    Diabetic neuropathy (HCC) 02/2020   Heart murmur    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   Hyperlipidemia age 25   Hypertension age 81   Hypocalcemia 11/2019   Hypokalemia 11/2019   Iron deficiency anemia    NSTEMI (non-ST elevated myocardial infarction) (HCC) 09/2020   Obesity    Proteinuria 12/09/2019   Pulmonary embolism (HCC) 09/2020    PAST SURGICAL HISTORY: Past Surgical History:  Procedure  Laterality Date   APPLICATION OF WOUND VAC N/A 12/14/2020   Procedure: APPLICATION OF WOUND VAC;  Surgeon: Dusty Sudie DEL, MD;  Location: [facility] OR;  Service: Thoracic;  Laterality: N/A;   APPLICATION OF WOUND VAC N/A 12/25/2020   Procedure: WOUND VAC CHANGE;  Surgeon: German Bartlett PEDLAR, MD;  Location: [facility] OR;  Service: Thoracic;  Laterality: N/A;   CARDIAC CATHETERIZATION  09/25/2020   CESAREAN SECTION  3/96   CLIPPING OF ATRIAL APPENDAGE N/A 10/09/2020   Procedure: CLIPPING OF ATRIAL APPENDAGE USING ATRICURE CLIP;  Surgeon: German Bartlett PEDLAR, MD;  Location: [facility] OR;  Service: Open Heart Surgery;  Laterality: N/A;   CORONARY ARTERY BYPASS GRAFT N/A 10/09/2020   Procedure: CORONARY ARTERY BYPASS GRAFTING (CABG), ON PUMP, TIMES FOUR, USING LEFT INTERNAL MAMMARY ARTERY AND LEFT RADIAL ARTERY (OPEN HARVEST);  Surgeon: German Bartlett PEDLAR, MD;  Location: [facility] OR;  Service: Open Heart Surgery;  Laterality: N/A;  POSSIBLE BIMA   INCISION AND DRAINAGE OF WOUND N/A 12/20/2020   Procedure: STERNAL WOUND DEBRIDEMENT WITH SKIN SUBSTITUTION AND ABRA;  Surgeon: Lowery Estefana RAMAN, DO;  Location: [facility] OR;  Service: Plastics;  Laterality: N/A;   IR THORACENTESIS ASP PLEURAL SPACE W/IMG GUIDE  10/16/2020   LEFT HEART CATH AND CORONARY ANGIOGRAPHY N/A 09/25/2020   Procedure: LEFT HEART CATH AND CORONARY ANGIOGRAPHY;  Surgeon: Anner Alm ORN, MD;  Location: [facility] INVASIVE CV LAB;  Service: Cardiovascular;  Laterality: N/A;   ORIF FEMUR FRACTURE     RADIAL ARTERY HARVEST Left 10/09/2020   Procedure: RADIAL ARTERY HARVEST;  Surgeon: German Bartlett PEDLAR, MD;  Location: [facility] OR;  Service: Open Heart Surgery;  Laterality: Left;   STERNAL CLOSURE N/A 12/25/2020   Procedure: STERNAL CLOSURE;  Surgeon: German Bartlett PEDLAR, MD;  Location: [facility] OR;  Service: Thoracic;  Laterality: N/A;   STERNAL WOUND DEBRIDEMENT N/A 12/14/2020   Procedure: STERNAL WOUND DEBRIDEMENT;  Surgeon: Dusty Sudie DEL, MD;  Location: [facility] OR;  Service: Thoracic;  Laterality:  N/A;   STERNAL WOUND DEBRIDEMENT N/A 12/18/2020   Procedure: STERNAL WOUND DEBRIDEMENT, Wound Vac Change;  Surgeon: German Bartlett PEDLAR, MD;  Location: [facility] OR;  Service: Thoracic;  Laterality: N/A;   STERNAL WOUND DEBRIDEMENT N/A 12/25/2020   Procedure: STERNAL WOUND IRRIGATION AND DEBRIDEMENT;  Surgeon: German Bartlett PEDLAR, MD;  Location: [facility] OR;  Service: Thoracic;  Laterality: N/A;   TEE WITHOUT CARDIOVERSION N/A 10/09/2020   Procedure: TRANSESOPHAGEAL ECHOCARDIOGRAM (TEE);  Surgeon: German Bartlett PEDLAR, MD;  Location: [facility] OR;  Service: Open Heart Surgery;  Laterality: N/A;    FAMILY HISTORY: Family History  Problem Relation Age of Onset   Heart disease Mother        CABG <50   Hypertension Mother    Diabetes Mother    Hyperlipidemia Mother    Heart attack Mother    Heart disease Father    Hyperlipidemia Father    Hypertension Father    Early death Father    Healthy Daughter    Diabetes Maternal Aunt    Cancer Maternal Grandmother        stomach cancer   Diabetes Maternal Aunt    Cancer Cousin        colon cancer (dx'd 29)    SOCIAL HISTORY: Social History   Socioeconomic History   Marital status: Single    Spouse name: Not on file   Number of children: Not on file   Years of education: 12   Highest education level: Some college, no degree  Occupational History   Occupation: estate manager/land agent  Tobacco Use   Smoking status: Never   Smokeless tobacco: Never  Vaping Use   Vaping status: Never Used  Substance and Sexual Activity   Alcohol use: No   Drug use: No   Sexual activity: Not Currently    Partners: Male    Comment: husband currently in [place], returning 02/2013  Other Topics Concern   Not on file  Social History Narrative   Works at a call center, 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  Lives with 65 year old daughter, 1 cat Husband is living in [place] (citizen there), trying to come to [place]  (has been delayed)   Social Drivers of Health   Financial Resource  Strain: Patient Declined (04/26/2024)   Overall Financial Resource Strain (CARDIA)    Difficulty of Paying Living Expenses: Patient declined  Food Insecurity: Patient Declined (04/26/2024)   Hunger Vital Sign    Worried About Running Out of Food in the Last Year: Patient declined    Ran Out of Food in the Last Year: Patient declined  Transportation Needs: Patient Declined (04/26/2024)   PRAPARE - Administrator, Civil Service (Medical): Patient declined    Lack of Transportation (Non-Medical): Patient declined  Physical Activity: Unknown (11/23/2023)   Exercise Vital Sign    Days of Exercise per Week: 0 days    Minutes of Exercise per Session: Not on file  Stress: Stress Concern Present (11/23/2023)   [employer] of Occupational Health - Occupational Stress Questionnaire    Feeling of Stress : Very much  Social Connections: Unknown (04/26/2024)   Social Connection and Isolation Panel    Frequency of Communication with Friends and Family: Patient declined    Frequency of Social Gatherings with Friends and Family: Patient declined    Attends Religious Services: Patient declined    Active Member of Clubs or Organizations: Patient declined    Attends Banker Meetings: Not on file    Marital Status: Patient declined  Intimate Partner Violence: Not At Risk (01/08/2021)   Humiliation, Afraid, Rape, and Kick questionnaire  Fear of Current or Ex-Partner: No    Emotionally Abused: No    Physically Abused: No    Sexually Abused: No      I personally spent a total of 45 minutes in the care of the patient today including preparing to see the patient, getting/reviewing separately obtained history, performing a medically appropriate exam/evaluation, counseling and educating, placing orders, documenting clinical information in the EHR, and independently interpreting results.  Harlene Bogaert, AGNP-BC  [facility] [address] [place], [place] [address]  Phone [phone] Fax [phone] Note: This document was prepared with digital dictation and possible smart phrase technology. Any transcriptional errors that result from this process are unintentional.

## 2024-07-19 ENCOUNTER — Ambulatory Visit: Payer: Self-pay | Admitting: Adult Health

## 2024-07-20 ENCOUNTER — Ambulatory Visit: Payer: Self-pay | Admitting: Nurse Practitioner

## 2024-07-22 LAB — HYPERCOAGULABLE PANEL, COMPREHENSIVE
APTT: 25.8 s
AT III Act/Nor PPP Chro: 108 %
Act. Prt C Resist w/FV Defic.: 2.6 ratio
Anticardiolipin Ab, IgG: <10 [GPL'U]
Anticardiolipin Ab, IgM: <10 [MPL'U]
Beta-2 Glycoprotein I, IgA: <10 SAU
Beta-2 Glycoprotein I, IgG: <10 SGU
Beta-2 Glycoprotein I, IgM: <10 SMU
DRVVT Screen Seconds: 38.2 s
Factor VII Antigen**: 106 %
Factor VIII Activity: 144 %
Hexagonal Phospholipid Neutral: 3 s
Homocysteine: 17.8 umol/L — AB
Prot C Ag Act/Nor PPP Imm: 88 %
Prot S Ag Act/Nor PPP Imm: 75 %
Protein C Ag/FVII Ag Ratio**: 0.8 ratio
Protein S Ag/FVII Ag Ratio**: 0.7 ratio

## 2024-07-22 LAB — VITAMIN B12: Vitamin B-12: 267 pg/mL (ref 232–1245)

## 2024-07-22 LAB — TSH: TSH: 2.67 u[IU]/mL (ref 0.450–4.500)

## 2024-07-25 ENCOUNTER — Encounter: Payer: Self-pay | Admitting: Licensed Clinical Social Worker

## 2024-07-25 ENCOUNTER — Other Ambulatory Visit: Payer: Self-pay | Admitting: Internal Medicine

## 2024-07-25 ENCOUNTER — Other Ambulatory Visit: Payer: Self-pay

## 2024-07-25 ENCOUNTER — Telehealth: Payer: Self-pay

## 2024-07-25 ENCOUNTER — Telehealth: Payer: Self-pay | Admitting: Licensed Clinical Social Worker

## 2024-07-25 DIAGNOSIS — I63133 Cerebral infarction due to embolism of bilateral carotid arteries: Secondary | ICD-10-CM

## 2024-07-25 NOTE — Telephone Encounter (Signed)
 LVM for pt per DPR on file explaining that her neurology office wanted to order a cardiac event monitor for hx stroke and that Dr. Wendel ordered it through our office. Explained that the monitor will be sent through the mail and if she has any questions, she can call our office.

## 2024-07-25 NOTE — Telephone Encounter (Signed)
-----   Message from Lurena Red, MD sent at 07/12/2024  3:39 PM EST ----- I can order it.  Thanks ----- Message ----- From: Whitfield Raisin, NP Sent: 07/12/2024   3:27 PM EST To: Arun K Thukkani, MD  Good afternoon, we are wanting to do a 30 day cardiac monitor in setting of recurrent strokes. If this something you would order or should I place the order? Please let me know! Thank you!

## 2024-07-26 ENCOUNTER — Other Ambulatory Visit: Payer: Self-pay

## 2024-07-26 ENCOUNTER — Telehealth: Payer: Self-pay | Admitting: Nurse Practitioner

## 2024-07-26 ENCOUNTER — Other Ambulatory Visit (HOSPITAL_COMMUNITY): Payer: Self-pay

## 2024-07-26 DIAGNOSIS — E1140 Type 2 diabetes mellitus with diabetic neuropathy, unspecified: Secondary | ICD-10-CM

## 2024-07-26 NOTE — Telephone Encounter (Signed)
 Copied from CRM #[id number]. Topic: Referral - Question >> Jul 25, 2024  4:43 PM Winona R wrote: Pt calling to provide new dermatology office as the one she is schedule for now s [facility], [place].

## 2024-07-26 NOTE — Progress Notes (Signed)
 S:     Chief Complaint  Patient presents with   Diabetes   Tiffany Le is a 48 y.o. year old female who presented for a telephone visit.   They were referred to the pharmacist by their PCP for assistance in managing diabetes. Tiffany Le PMH includes HTN, NSTEMI, CAD s/p 4vCABG (2022), CHF (EF 55-60% in June 2024, improved from 35-40% in 2022), CVA (2022), OSA, T2DM, BMI > 30, HLD.    Subjective: Patient was last seen by PCP, Tiffany Shall, NP, on 06/21/24. We screened her for LIBERATE. A1C was 7.7%, so she was not eligible. She was encouraged to start Repatha  for elevated cholesterol in the setting of progressive ASCVD. Since then, patient has been dealing with a rash that flares and is only partially managed with steroid cream.  Today, patient reports doing ok. Rash is main complaint. Derm scheduled for July 2026 - she is trying to get in earlier. She did not start Repatha , states PCP advised her to wait until rash resolved. States she had rash prior to starting escitalopram , so it does not seem to be related to any new medications. Reports steroid cream only calms it a little bit. Reports she continues to stay away from soda. Is having some regular juice. Likes Simply Zero lemonade. States she needs Farxiga , thinks there may be an insurance issue. Never received her BP cuff. She did purchase an OTC glucometer.   Care Team: Primary Care Provider: Paseda, Folashade R, FNP ; Next Scheduled Visit: 08/09/24 Cardiologist: Glendia Ferrier, PA; Next Scheduled Visit: 09/19/24   Medication Access/Adherence   Current Pharmacy:  [id number] - [place], [place] - [address] AT [place] [address] [place] [address] Phone: [phone] Fax: [phone]     Patient reports affordability concerns with their medications: No  - Medicaid Patient reports access/transportation concerns to their pharmacy: No  Patient reports adherence concerns  with their medications:  No  - fill hx ok   Diabetes:   Current medications: glipizide  5 mg BID, Farxiga  10 mg daily (running out soon, needs PA renewed), Ozempic  2 mg weekly Medications tried in the past: Lantus , metformin  IR (diarrhea)   Denies GI AE with Ozempic    Current glucose readings: ReliON - checking occasionally. Cannot recall recent values, but states they were good   Patient denies hypoglycemic s/sx including dizziness, shakiness, sweating. Patient reports hyperglycemic symptoms including occasionally blurry vision, neuropathy (bottoms of feet, numbness in fingers).    Current meal patterns: Eats 2-3 meals/day - Supper: has large servings of starches at home, meat - less vegetables based on family's preference. - Snacks: raw vegetables at work - Drinks: Still avoiding soda. Some regular juice. Trying to have more zero sugar/diet lemonade.   Current physical activity: work, cleaning in the house     Hyperlipidemia/ASCVD Risk Reduction   Current lipid lowering medications: rosuvastatin  40 mg daily (prefers to take 2x 20 mg tabs due to pill size), ezetimibe  10 mg daily, Repatha  140 mg every 14 days (not started yet due to concern about it worsening ongoing rash)   Antiplatelet regimen: aspirin  81 mg daily   ASCVD History:  CAD s/p 4vCABG (2022), CHF (EF 55-60% in June 2024, improved from 35-40% in 2022), CVA (2022) Family History: Mother with CABG at age < 10     Heart Failure (EF 55-60% in June 2024, improved from 35-40% in 2022%):   Current medications:  ACEi/ARB/ARNI: losartan   50 mg daily SGLT2i: Farxiga  10 mg daily - needs refill as above Beta blocker: none Mineralocorticoid Receptor Antagonist: spironolactone  25 mg daily Diuretic regimen: torsemide  20 mg BID   Current home blood pressure readings: states she did not receive BP cuff yet. Order was sent in Nov.   O:   Lab Results  Component Value Date   HGBA1C 7.7 (A) 06/21/2024    Lipid Panel      Component Value Date/Time   CHOL 103 06/21/2024 1541   TRIG 47 06/21/2024 1541   HDL 31 (L) 06/21/2024 1541   CHOLHDL 3.3 06/21/2024 1541   CHOLHDL 4.4 09/25/2020 1418   VLDL 10 09/25/2020 1418   LDLCALC 60 06/21/2024 1541    Clinical Atherosclerotic Cardiovascular Disease (ASCVD): Yes  The ASCVD Risk score (Arnett DK, et al., 2019) failed to calculate for the following reasons:   Risk score cannot be calculated because patient has a medical history suggesting prior/existing ASCVD   * - Cholesterol units were assumed     A/P:  Diabetes: - Currently uncontrolled with most recent A1C of 7.7% above goal <7%. Patient is not eligible to obtain Mounjaro through insurance as she has not tried Trulicity and she only has mild OSA (moderate-severe required to obtain Zepbound via Medicaid). Would prefer for her to stay on Ozempic  rather than switch to Trulicity, since Trulicity has less potent BG lowering effect. Continued to encourage avoiding sugary beverages. If next A1C still > 7%, consider retrial of low dose XR metformin  (could combine with Farxiga  to prevent increasing pill burden). Encouraged more regular BG monitoring for motivation.  - Last UACR Aug 2024 - 59 mg/g - Reviewed long term cardiovascular and renal outcomes of uncontrolled blood sugar - Reviewed goal A1c, goal fasting, and goal 2 hour post prandial glucose - Reviewed dietary modifications including utilizing the healthy plate method, limiting portion size of carbohydrate foods, increasing intake of protein and non-starchy vegetables. Counseled patient to stay hydrated with water  throughout the day. Commended patient for cutting back on sugary drinks. - Reviewed lifestyle modifications including: aiming for 150 minutes of moderate intensity exercise every week.  - Recommend to continue Ozempic  2 mg weekly, glipizide  5 mg BID, Farxiga  10 mg daily.  - Collaborated with patient advocate to resubmit Farxiga  PA. - Advised to monitor  BG once daily FBG and occasionally 2-hr PPG - Next A1C due 09/22/23       Hyperlipidemia/ASCVD Risk Reduction: - Currently uncontrolled with most recent LDL-C of 60 mg/dL above goal < 55 mg/dL given premature ASCVD. Was taking max-tolerated statin and ezetimibe  prior to last lipid panel. Patient would benefit from [facility]. If rash stable at PCP f/u on 08/09/24, advise to initiate Repatha . - Reviewed long term complications of uncontrolled cholesterol - Reviewed lifestyle recommendations to lower LDL-C including regular physical activity, 5-10% weight loss, eating a diet low in saturated fat, and increasing intake of fiber to at least 25 g per day. - Recommend to continue rosuvastatin  40 mg daily, ezetimibe  10 mg daily - Recommend to start Repatha  140 mg every 14 days pending PCP assessment of current rash on 08/09/24     Heart Failure: - Currently appropriately managed. Patient following with cardiology. - Reviewed appropriate blood pressure monitoring technique and reviewed goal blood pressure. [employer] who reported they can deliver BP cuff tomorrow. - Reviewed to weigh daily and when to contact cardiology with weight gain - Recommend to continue current regimen: Current medications:  ACEi/ARB/ARNI: losartan  50 mg daily  SGLT2i: Farxiga  10 mg daily Beta blocker: none Mineralocorticoid Receptor Antagonist: spironolactone  25 mg daily Diuretic regimen: torsemide  20 mg BID   Follow-up:  Pharmacist in person 09/21/24 PCP clinic visit 08/09/24   Lorain Baseman, PharmD [facility] [phone]

## 2024-07-27 ENCOUNTER — Other Ambulatory Visit: Payer: Self-pay

## 2024-07-27 ENCOUNTER — Other Ambulatory Visit

## 2024-07-27 ENCOUNTER — Telehealth: Payer: Self-pay

## 2024-07-27 NOTE — Patient Instructions (Signed)
 Tiffany Le - I am sorry I was unable to reach you today for our scheduled appointment. I work with Paseda, Folashade R, FNP and am calling to support your healthcare needs. Please contact me at [phone] at your earliest convenience. I look forward to speaking with you soon.   Thank you,  Rolin Kerns, LCSW [place]  [facility], [facility] Clinical Social Worker Direct Dial: [phone]  Fax: [phone] Website: [email or web address] 4:38 PM

## 2024-07-27 NOTE — Telephone Encounter (Signed)
 MANUALLY FAXED PA TO [facility] TODAY WITH UPDATED DIAGNOSIS.

## 2024-07-28 ENCOUNTER — Other Ambulatory Visit: Payer: Self-pay

## 2024-07-28 NOTE — Telephone Encounter (Signed)
 Info was added to referral. [facility]

## 2024-07-28 NOTE — Telephone Encounter (Signed)
 Pharmacy Patient Advocate Encounter  Received notification from [facility] MEDICAID that Prior Authorization for FARXIGA  has been APPROVED from 07/27/2024 to 07/27/2025   PA #/Case ID/Reference #: [id number]

## 2024-07-30 ENCOUNTER — Emergency Department (HOSPITAL_COMMUNITY)
Admission: EM | Admit: 2024-07-30 | Discharge: 2024-07-30 | Disposition: A | Discharge status: Home / Self Care | Attending: Emergency Medicine | Admitting: Emergency Medicine

## 2024-07-30 ENCOUNTER — Encounter (HOSPITAL_COMMUNITY): Payer: Self-pay | Admitting: *Deleted

## 2024-07-30 ENCOUNTER — Other Ambulatory Visit: Payer: Self-pay

## 2024-07-30 DIAGNOSIS — R21 Rash and other nonspecific skin eruption: Secondary | ICD-10-CM | POA: Diagnosis present

## 2024-07-30 DIAGNOSIS — I110 Hypertensive heart disease with heart failure: Secondary | ICD-10-CM | POA: Insufficient documentation

## 2024-07-30 DIAGNOSIS — I2510 Atherosclerotic heart disease of native coronary artery without angina pectoris: Secondary | ICD-10-CM | POA: Diagnosis not present

## 2024-07-30 DIAGNOSIS — I509 Heart failure, unspecified: Secondary | ICD-10-CM | POA: Insufficient documentation

## 2024-07-30 DIAGNOSIS — Z7982 Long term (current) use of aspirin: Secondary | ICD-10-CM | POA: Diagnosis not present

## 2024-07-30 LAB — COMPREHENSIVE METABOLIC PANEL WITH GFR
ALT: 18 U/L (ref 0–44)
AST: 20 U/L (ref 15–41)
Albumin: 4 g/dL (ref 3.5–5.0)
Alkaline Phosphatase: 82 U/L (ref 38–126)
Anion gap: 11 (ref 5–15)
BUN: 14 mg/dL (ref 6–20)
CO2: 27 mmol/L (ref 22–32)
Calcium: 9.2 mg/dL (ref 8.9–10.3)
Chloride: 99 mmol/L (ref 98–111)
Creatinine, Ser: 0.97 mg/dL (ref 0.44–1.00)
GFR, Estimated: >60 mL/min
Glucose, Bld: 174 mg/dL — ABNORMAL HIGH (ref 70–99)
Potassium: 3.6 mmol/L (ref 3.5–5.1)
Sodium: 137 mmol/L (ref 135–145)
Total Bilirubin: 0.3 mg/dL (ref 0.0–1.2)
Total Protein: 8 g/dL (ref 6.5–8.1)

## 2024-07-30 LAB — CBC WITH DIFFERENTIAL/PLATELET
Abs Immature Granulocytes: 0.01 K/uL (ref 0.00–0.07)
Basophils Absolute: 0 K/uL (ref 0.0–0.1)
Basophils Relative: 0 %
Eosinophils Absolute: 0 K/uL (ref 0.0–0.5)
Eosinophils Relative: 1 %
HCT: 34.3 % — ABNORMAL LOW (ref 36.0–46.0)
Hemoglobin: 10.7 g/dL — ABNORMAL LOW (ref 12.0–15.0)
Immature Granulocytes: 0 %
Lymphocytes Relative: 27 %
Lymphs Abs: 1.5 K/uL (ref 0.7–4.0)
MCH: 28.1 pg (ref 26.0–34.0)
MCHC: 31.2 g/dL (ref 30.0–36.0)
MCV: 90 fL (ref 80.0–100.0)
Monocytes Absolute: 0.5 K/uL (ref 0.1–1.0)
Monocytes Relative: 9 %
Neutro Abs: 3.4 K/uL (ref 1.7–7.7)
Neutrophils Relative %: 63 %
Platelets: 331 K/uL (ref 150–400)
RBC: 3.81 MIL/uL — ABNORMAL LOW (ref 3.87–5.11)
RDW: 13.4 % (ref 11.5–15.5)
WBC: 5.4 K/uL (ref 4.0–10.5)
nRBC: 0 % (ref 0.0–0.2)

## 2024-07-30 LAB — PREGNANCY, URINE: Preg Test, Ur: NEGATIVE

## 2024-07-30 LAB — SEDIMENTATION RATE: Sed Rate: 57 mm/h — ABNORMAL HIGH (ref 0–22)

## 2024-07-30 MED ORDER — FLUCONAZOLE 150 MG PO TABS: ORAL_TABLET | ORAL | 0 refills | Status: DC

## 2024-07-30 MED ORDER — METHYLPREDNISOLONE 4 MG PO TBPK: ORAL_TABLET | ORAL | 0 refills | Status: DC

## 2024-07-30 NOTE — ED Provider Triage Note (Signed)
 Emergency Medicine Provider Triage Evaluation Note  ANETT RANKER , a 48 y.o. female  was evaluated in triage.  Pt complains of rash. Recurrent itchy burning rash throughout body x 1 month not relief with meds prescribed by [facility] and PCP.  Has a derm referral but not till July of next year.  Frustrated.  No fever, sore throat, cp, sob, abd pain.  No changes account for this rash  Review of Systems  Positive: As above Negative: As above  Physical Exam  BP 127/74   Pulse 73   Temp 98.2 F (36.8 C) (Oral)   Resp 18   Ht 5' 4 (1.626 m)   Wt 121.1 kg   LMP 07/16/2024 (Approximate) Comment: Last through 06-27-2024  SpO2 100%   BMI 45.83 kg/m  Gen:   Awake, no distress   Resp:  Normal effort  MSK:   Moves extremities without difficulty  Other:    Medical Decision Making  Medically screening exam initiated at 4:53 PM.  Appropriate orders placed.  LUKA REISCH was informed that the remainder of the evaluation will be completed by another provider, this initial triage assessment does not replace that evaluation, and the importance of remaining in the ED until their evaluation is complete.     Nivia Colon, PA-C 07/30/24 1655

## 2024-07-30 NOTE — Discharge Instructions (Addendum)
 As we discussed, I have given you a prescription for an antifungal medication that you should take as prescribed in its entirety for management of your symptoms.  I have also given you a prescription for a few doses of oral steroids.  Please continue to take the Benadryl  as needed for itching.  I did reach out to our dermatology department on-call to try and get you in for an earlier appointment.  I have also given you their number to call to schedule an appointment.  Please do so on Monday morning.  Return if development of any new or worsening symptoms.

## 2024-07-30 NOTE — ED Provider Notes (Signed)
 " [place] [facility] Provider Note   CSN: [id number] Arrival date & time: 07/30/24  1550     Patient presents with: Rash   Tiffany Le is a 48 y.o. female.   Patient with history of CHF, CAD, CVA, diabetes, hyperlipidemia, hypertension, NSTEMI, PE presents today with complaints of rash.  Reports that her rash has been present for approximately 2 months now.  Reports that initially presented on her bilateral arms.  Since then it has become more diffuse and is present on her bilateral arms, legs, chest, abdomen, and back. No lesions on her face. Denies any fevers or chills. Reports that she originally came to [facility] ED and was prescribed topical triamcinolone  cream which she tried with minimal improvement.  Reports that when the area continued to spread she went back to an urgent care was given a stronger dose of steroids.  She has also been referred to dermatology, however unfortunately reports that she was told that the next available appointment was in July. States that the rash is very itchy but also hurts to scratch and burns at times as well. The lesions do not drain fluid. No joint pain or swelling. No tick bites or exposures. No international travel. She has taken Benadryl  for itching with some improvement.  Denies any lesions on her palms or soles, no lesions in the mouth.  The history is provided by the patient. No language interpreter was used.  Rash      Prior to Admission medications  Medication Sig Start Date End Date Taking? Authorizing Provider  albuterol  (PROVENTIL  HFA) 108 (90 Base) MCG/ACT inhaler Inhale 2 puffs into the lungs every 6 (six) hours as needed for wheezing. 04/26/24   Paseda, Folashade R, FNP  aspirin  EC 81 MG tablet Take 1 tablet (81 mg total) by mouth daily. 11/24/23   Oley Bascom RAMAN, NP  Blood Glucose Monitoring Suppl (BLOOD GLUCOSE MONITOR SYSTEM) w/Device KIT Test in the morning, at noon, and at bedtime.  03/26/23   Paseda, Folashade R, FNP  Blood Pressure Monitoring (BLOOD PRESSURE CUFF) MISC Use as directed to monitor blood pressure 06/20/24   Paseda, Folashade R, FNP  dapagliflozin  propanediol (FARXIGA ) 10 MG TABS tablet Take 1 tablet (10 mg total) by mouth daily. 11/24/23   Oley Bascom RAMAN, NP  escitalopram  (LEXAPRO ) 10 MG tablet Take 1 tablet (10 mg total) by mouth daily. 06/21/24   Paseda, Folashade R, FNP  Evolocumab  (REPATHA  SURECLICK) 140 MG/ML SOAJ Inject 140 mg into the skin every 14 (fourteen) days. Patient not taking: Reported on 07/26/2024 06/20/24   Paseda, Folashade R, FNP  ezetimibe  (ZETIA ) 10 MG tablet Take 1 tablet (10 mg total) by mouth daily. 04/26/24   Paseda, Folashade R, FNP  glipiZIDE  (GLUCOTROL ) 5 MG tablet TAKE 1 TABLET(5 MG) BY MOUTH TWICE DAILY BEFORE A MEAL 03/01/24   Nichols, Tonya S, NP  Glucose Blood (BLOOD GLUCOSE TEST STRIPS) STRP Test in the morning, at noon, and at bedtime. 03/26/23   Paseda, Folashade R, FNP  losartan  (COZAAR ) 50 MG tablet Take 1 tablet (50 mg total) by mouth at bedtime. 11/24/23   Oley Bascom RAMAN, NP  methocarbamol  (ROBAXIN ) 500 MG tablet Take 1 tablet (500 mg total) by mouth every 8 (eight) hours as needed for muscle spasms. 08/14/22   Nichols, Tonya S, NP  nitroGLYCERIN  (NITROSTAT ) 0.4 MG SL tablet DISSOLVE 1 TABLET UNDER THE TONGUE EVERY 5 MINUTES FOR 3 DOSES AS NEEDED FOR CHEST PAIN 04/26/24  Paseda, Folashade R, FNP  ondansetron  (ZOFRAN -ODT) 4 MG disintegrating tablet Take 1 tablet (4 mg total) by mouth every 8 (eight) hours as needed for nausea or vomiting. 07/01/24   Vonna Sharlet POUR, MD  rosuvastatin  (CRESTOR ) 20 MG tablet Take 2 tablets (40 mg total) by mouth daily. 04/26/24   Paseda, Folashade R, FNP  Semaglutide , 2 MG/DOSE, (OZEMPIC , 2 MG/DOSE,) 8 MG/3ML SOPN Inject 2 mg into the skin once a week. 06/21/24   Paseda, Folashade R, FNP  spironolactone  (ALDACTONE ) 25 MG tablet Take 1 tablet (25 mg total) by mouth daily. 11/24/23   Oley Bascom RAMAN, NP  torsemide  (DEMADEX ) 20 MG tablet TAKE 1 TABLET(20 MG) BY MOUTH TWICE DAILY 05/24/24   Paseda, Folashade R, FNP  traZODone  (DESYREL ) 50 MG tablet Take 0.5-1 tablets (25-50 mg total) by mouth at bedtime as needed for sleep. 11/24/23   Oley Bascom RAMAN, NP  triamcinolone  ointment (KENALOG ) 0.5 % Apply  twice daily to affected areas for 1 to 2 weeks. Do not use on the face 07/05/24   Paseda, Folashade R, FNP    Allergies: Oxycodone  and Penicillins    Review of Systems  Skin:  Positive for rash.  All other systems reviewed and are negative.   Updated Vital Signs BP 127/74   Pulse 73   Temp 98.2 F (36.8 C) (Oral)   Resp 18   Ht 5' 4 (1.626 m)   Wt 121.1 kg   LMP 07/16/2024 (Approximate) Comment: Last through 06-27-2024  SpO2 100%   BMI 45.83 kg/m   Physical Exam Vitals and nursing note reviewed.  Constitutional:      General: She is not in acute distress.    Appearance: Normal appearance. She is normal weight. She is not ill-appearing, toxic-appearing or diaphoretic.  HENT:     Head: Normocephalic and atraumatic.     Mouth/Throat:     Comments: No mucosal lesions Cardiovascular:     Rate and Rhythm: Normal rate and regular rhythm.     Heart sounds: Normal heart sounds.  Pulmonary:     Effort: Pulmonary effort is normal. No respiratory distress.     Breath sounds: Normal breath sounds.  Abdominal:     General: Abdomen is flat.     Palpations: Abdomen is soft.     Tenderness: There is no abdominal tenderness.  Musculoskeletal:        General: Normal range of motion.     Cervical back: Normal range of motion.  Skin:    General: Skin is warm and dry.     Comments: Patient with slightly raised erythematous lesions. No vesicles or pustules. Lesions appear to form semi-circles.  Negative Nikolsky sign.  No tenderness to palpation.  No lesions on the palms or soles. See images below for further  Neurological:     General: No focal deficit present.     Mental Status: She  is alert.  Psychiatric:        Mood and Affect: Mood normal.        Behavior: Behavior normal.        (all labs ordered are listed, but only abnormal results are displayed) Labs Reviewed  COMPREHENSIVE METABOLIC PANEL WITH GFR - Abnormal; Notable for the following components:      Result Value   Glucose, Bld 174 (*)    All other components within normal limits  CBC WITH DIFFERENTIAL/PLATELET - Abnormal; Notable for the following components:   RBC 3.81 (*)    Hemoglobin 10.7 (*)  HCT 34.3 (*)    All other components within normal limits  SEDIMENTATION RATE - Abnormal; Notable for the following components:   Sed Rate 57 (*)    All other components within normal limits  PREGNANCY, URINE    EKG: None  Radiology: No results found.   Procedures   Medications Ordered in the ED - No data to display                                  Medical Decision Making  This patient is a 48 y.o. female who presents to the ED for concern of rash, this involves an extensive number of treatment options, and is a complaint that carries with it a high risk of complications and morbidity. The emergent differential diagnosis prior to evaluation includes, but is not limited to, tinea corporis, urticaria, syphilis, SJS, erythema multiforme. This is not an exhaustive differential.   Past Medical History / Co-morbidities / Social History:  has a past medical history of CHF (congestive heart failure) (HCC), Coronary artery disease, CVA (cerebral vascular accident) (HCC) (09/2020), Diabetes mellitus without complication (HCC), Diabetic neuropathy (HCC) (02/2020), Heart murmur, CABG (09/2020), Hyperlipidemia (age 64), Hypertension (age 75), Hypocalcemia (11/2019), Hypokalemia (11/2019), Iron deficiency anemia, NSTEMI (non-ST elevated myocardial infarction) (HCC) (09/2020), Obesity, Proteinuria (12/09/2019), and Pulmonary embolism (HCC) (09/2020).  Additional history: Chart reviewed. Pertinent results  include: has been seen previously for this rash and placed on topical triamcinolone    Physical Exam: Physical exam performed. The pertinent findings include: Patient with slightly raised erythematous lesions. No vesicles or pustules. Lesions appear to form semi-circles.  Negative Nikolsky sign.  No tenderness to palpation.  No lesions on the palms or soles. See above for further   Lab Tests: I ordered, and personally interpreted labs.  The pertinent results include:  Glucose 174, hemoglobin 10.7, ESR 57   Disposition: After consideration of the diagnostic results and the patients response to treatment, I feel that emergency department workup does not suggest an emergent condition requiring admission or immediate intervention beyond what has been performed at this time. The plan is: discharge with close outpatient follow-up and return precautions. Unclear etiology of rash, does have some circular raised lesions that appear somewhat like tinea corporis, will cover with fluconazole .  Will also treat with medrol  dosepak. Patient denies any difficulty breathing or swallowing.  Pt has a patent airway without stridor and is handling secretions without difficulty; no angioedema. No blisters, no pustules, no warmth, no draining sinus tracts, no superficial abscesses, no bullous impetigo, no vesicles, no desquamation, no target lesions with dusky purpura or a central bulla. Not tender to touch. No concern for superimposed infection. No concern for SJS, TEN, TSS, tick borne illness, syphilis or other life-threatening condition.  Given referral to [place] dermatology to try and get her in to see derm earlier than July 2026.   Evaluation and diagnostic testing in the emergency department does not suggest an emergent condition requiring admission or immediate intervention beyond what has been performed at this time.  Plan for discharge with close PCP follow-up.  Patient is understanding and amenable with plan,  educated on red flag symptoms that would prompt immediate return.  Patient discharged in stable condition.  This is a shared visit with supervising physician Dr. Yolande who has independently evaluated patient & provided guidance in evaluation/management/disposition, in agreement with care   Final diagnoses:  Rash    ED  Discharge Orders          Ordered    fluconazole  (DIFLUCAN ) 150 MG tablet        07/30/24 2012    methylPREDNISolone  (MEDROL  DOSEPAK) 4 MG TBPK tablet        07/30/24 2012          An After Visit Summary was printed and given to the patient.      Nora Lauraine DELENA DEVONNA 07/30/24 2034    Yolande Lamar BROCKS, MD 08/06/24 [phone]  "

## 2024-07-30 NOTE — ED Triage Notes (Signed)
 Patient c/o rash all over body x 1 month, has tried multiple treatments for same with no relief. Patient has dermatology appt in July, denies detergent or soap changes causing the rash.

## 2024-08-01 ENCOUNTER — Telehealth: Payer: Self-pay

## 2024-08-01 NOTE — Progress Notes (Signed)
 Complex Care Management Care Guide Note  08/01/2024 Name: Tiffany Le MRN: [id number] DOB: 04/15/1976  Tiffany Le is a 48 y.o. year old female who is a primary care patient of Paseda, Folashade R, FNP and is actively engaged with the care management team. I reached out to [name] by phone today to assist with re-scheduling  with the Licensed Clinical Child Psychotherapist.  Follow up plan: Telephone appointment with complex care management team member scheduled for:  08/26/24 @ 1:30 PM   Leotis Rase [facility], [facility] Guide  Direct Dial: [phone]  Fax [phone]

## 2024-08-01 NOTE — Progress Notes (Signed)
 Complex Care Management Care Guide Note  08/01/2024 Name: Tiffany Le MRN: [id number] DOB: [birth year]  Tiffany Le is a 48 y.o. year old female who is a primary care patient of Paseda, Folashade R, FNP and is actively engaged with the care management team. I reached out to Tiffany Le by phone today to assist with re-scheduling  with the Licensed Clinical Child Psychotherapist.  Follow up plan: Unsuccessful telephone outreach attempt made. A HIPAA compliant phone message was left for the patient providing contact information and requesting a return call.  Leotis Rase [facility], [facility] Guide  Direct Dial: [phone]  Fax [phone]

## 2024-08-02 ENCOUNTER — Encounter: Payer: Self-pay | Admitting: Physician Assistant

## 2024-08-02 ENCOUNTER — Ambulatory Visit: Payer: Self-pay | Admitting: Nurse Practitioner

## 2024-08-02 ENCOUNTER — Ambulatory Visit: Admitting: Physician Assistant

## 2024-08-02 VITALS — BP 123/64

## 2024-08-02 DIAGNOSIS — R21 Rash and other nonspecific skin eruption: Secondary | ICD-10-CM

## 2024-08-02 NOTE — Patient Instructions (Signed)

## 2024-08-02 NOTE — Progress Notes (Signed)
" ° °  New Patient Visit   Subjective  Tiffany Le is a 48 y.o. female who presents for a NEW PATIENT appointment to be examined for the concerns as listed below.   Rash: Not flared today. When flared it is located at the back and lower extremities that first presented 2 mo ago. She stated that areas are itchy rating it 10/10. Pt mentioned that it comes and goes. She stated that her hands will swell instead of itch and are painful. She mentioned that she has not tried any new body care items or laundry detergent. NO NEW MEDICATIONS. The only change she can note is that she started using a CPAP machine around the time the rash started.   Patient seen in ER on 12/20 and prescribed oral prednisone that she started today.   The following portions of the chart were reviewed this encounter and updated as appropriate: medications, allergies, medical history  Review of Systems:  No other skin or systemic complaints except as noted in HPI or Assessment and Plan.  Objective  Well appearing patient in no apparent distress; mood and affect are within normal limits.    A focused examination was performed of the following areas: scattered   Relevant exam findings are noted in the Assessment and Plan.   Assessment & Plan   RASH Exam: clear on exam today  Treatment Plan: - Complete prednisone dose - When flared called the office to schedule an OV for 2 Bx RASH AND OTHER NONSPECIFIC SKIN ERUPTION   Existing Treatments - triamcinolone  ointment (KENALOG ) 0.5 % - Apply  twice daily to affected areas for 1 to 2 weeks. Do not use on the face  Return if symptoms worsen or fail to improve.   Documentation: I have reviewed the above documentation for accuracy and completeness, and I agree with the above.  I, Shirron Maranda, CMA II, am acting as scribe for:   Iyonnah Ferrante K, PA-C     "

## 2024-08-09 ENCOUNTER — Ambulatory Visit: Payer: Self-pay | Admitting: Nurse Practitioner

## 2024-08-10 ENCOUNTER — Other Ambulatory Visit

## 2024-08-10 ENCOUNTER — Inpatient Hospital Stay: Admission: RE | Admit: 2024-08-10 | Source: Ambulatory Visit

## 2024-08-18 ENCOUNTER — Other Ambulatory Visit

## 2024-08-18 ENCOUNTER — Inpatient Hospital Stay: Admission: RE | Admit: 2024-08-18

## 2024-08-26 ENCOUNTER — Telehealth: Payer: Self-pay | Admitting: Licensed Clinical Social Worker

## 2024-08-26 ENCOUNTER — Encounter: Payer: Self-pay | Admitting: Licensed Clinical Social Worker

## 2024-08-26 NOTE — Patient Instructions (Signed)
 [name] - I am sorry I was unable to reach you today for our scheduled appointment. I work with Paseda, Folashade R, FNP and am calling to support your healthcare needs. Please contact me at [phone] at your earliest convenience. I look forward to speaking with you soon.   Thank you,  Rolin Kerns, LCSW [place]  [facility], [facility] Clinical Social Worker Direct Dial: [phone]  Fax: [phone] Website: [email or web address] 3:32 PM

## 2024-08-29 ENCOUNTER — Encounter: Payer: Self-pay | Admitting: Nurse Practitioner

## 2024-08-29 ENCOUNTER — Ambulatory Visit: Admitting: Nurse Practitioner

## 2024-08-29 VITALS — BP 141/71 | HR 68 | Wt 259.0 lb

## 2024-08-29 DIAGNOSIS — R5383 Other fatigue: Secondary | ICD-10-CM

## 2024-08-29 DIAGNOSIS — Z8673 Personal history of transient ischemic attack (TIA), and cerebral infarction without residual deficits: Secondary | ICD-10-CM

## 2024-08-29 DIAGNOSIS — I5032 Chronic diastolic (congestive) heart failure: Secondary | ICD-10-CM | POA: Diagnosis not present

## 2024-08-29 DIAGNOSIS — E785 Hyperlipidemia, unspecified: Secondary | ICD-10-CM | POA: Diagnosis not present

## 2024-08-29 DIAGNOSIS — D649 Anemia, unspecified: Secondary | ICD-10-CM | POA: Insufficient documentation

## 2024-08-29 DIAGNOSIS — E538 Deficiency of other specified B group vitamins: Secondary | ICD-10-CM | POA: Insufficient documentation

## 2024-08-29 DIAGNOSIS — E1140 Type 2 diabetes mellitus with diabetic neuropathy, unspecified: Secondary | ICD-10-CM | POA: Diagnosis not present

## 2024-08-29 DIAGNOSIS — R7689 Other specified abnormal immunological findings in serum: Secondary | ICD-10-CM | POA: Diagnosis not present

## 2024-08-29 DIAGNOSIS — E1165 Type 2 diabetes mellitus with hyperglycemia: Secondary | ICD-10-CM

## 2024-08-29 DIAGNOSIS — M2550 Pain in unspecified joint: Secondary | ICD-10-CM | POA: Diagnosis not present

## 2024-08-29 DIAGNOSIS — E559 Vitamin D deficiency, unspecified: Secondary | ICD-10-CM | POA: Diagnosis not present

## 2024-08-29 DIAGNOSIS — I10 Essential (primary) hypertension: Secondary | ICD-10-CM

## 2024-08-29 NOTE — Assessment & Plan Note (Addendum)
 Lab Results  Component Value Date   HGBA1C 7.7 (A) 06/21/2024   A1c at 7.7%, above target. - Continue Farxiga  10 mg daily, glipizide  5 mg twice daily, and Ozempic   2 mg daily - Encouraged regular moderate exercise. - Advised dietary modifications to reduce sugar and sweets intake. - Recheck A1c in two months.

## 2024-08-29 NOTE — Assessment & Plan Note (Addendum)
" ° ° °    08/29/2024    3:09 PM 08/29/2024    3:02 PM 08/02/2024    9:51 AM 07/30/2024    8:12 PM 07/30/2024    4:28 PM 07/30/2024    3:54 PM 07/12/2024    1:38 PM  BP/Weight  Systolic BP 141 136 123 145  127 131  Diastolic BP 71 83 64 67  74 73  Wt. (Lbs)  259   266.98  267  BMI  44.46 kg/m2   45.83 kg/m2  45.83 kg/m2  Blood pressure is elevated in the office today but has been well-controlled at home continue torsemide  20 mg twice daily spironolactone  25 mg daily, losartan  50 mg daily Blood pressure goal is less than 130/80, advised to call the office with blood pressure readings consistently greater than 130/80 Continue current medications.  Recommended DASH diet and dietary sodium restrictions, moderate exercises at least 150 minutes weekly as tolerated         "

## 2024-08-29 NOTE — Assessment & Plan Note (Signed)
 Continue aspirin  81 mg daily, Crestor  40 mg daily, Zetia  10 mg daily, Repatha  140 mg every 2 weeks

## 2024-08-29 NOTE — Assessment & Plan Note (Signed)
 Chronic condition probably related to her heart failure Continue current medications and follow-up with cardiology

## 2024-08-29 NOTE — Assessment & Plan Note (Signed)
 Lab Results  Component Value Date   VITAMINB12 267 07/12/2024  Neurology recommending B12 1000 mcg daily and folic acid  Will repeat labs at next visit

## 2024-08-29 NOTE — Assessment & Plan Note (Signed)
 Continue torsemide  20 mg twice daily, spironolactone  25 mg daily, Farxiga  10 mg daily Maintain close follow-up with cardiology

## 2024-08-29 NOTE — Assessment & Plan Note (Addendum)
 Lab Results  Component Value Date   WBC 5.4 07/30/2024   HGB 10.7 (L) 07/30/2024   HCT 34.3 (L) 07/30/2024   MCV 90.0 07/30/2024   PLT 331 07/30/2024  Rechecking labs Could be due to her abnormal uterine bleeding Pelvic ultrasound ordered but has not been completed

## 2024-08-29 NOTE — Assessment & Plan Note (Signed)
 Lab Results  Component Value Date   CHOL 103 06/21/2024   HDL 31 (L) 06/21/2024   LDLCALC 60 06/21/2024   TRIG 47 06/21/2024   CHOLHDL 3.3 06/21/2024   Recently started on Repatha , awaiting cholesterol re-evaluation. - Continue Repatha  as prescribed. - Recheck cholesterol levels in two months.

## 2024-08-29 NOTE — Patient Instructions (Addendum)
 Goal for fasting blood sugar ranges from 80 to 120 and 2 hours after any meal or at bedtime should be between 130 to 170.     Around 3 times per week, check your blood pressure 2 times per day. once in the morning and once in the evening. The readings should be at least one minute apart. Write down these values and bring them to your next nurse visit/appointment.  When you check your BP, make sure you have been doing something calm/relaxing 5 minutes prior to checking. Both feet should be flat on the floor and you should be sitting. Use your left arm and make sure it is in a relaxed position (on a table), and that the cuff is at the approximate level/height of your heart.  Blood pressure goal is less than 130/80   Please call [phone]   to schedule your mammogram.  [facility]. [address] [place], [place] [address]. [place] .    It is important that you exercise regularly at least 30 minutes 5 times a week as tolerated  Think about what you will eat, plan ahead. Choose  clean, green, fresh or frozen over canned, processed or packaged foods which are more sugary, salty and fatty. 70 to 75% of food eaten should be vegetables and fruit. Three meals at set times with snacks allowed between meals, but they must be fruit or vegetables. Aim to eat over a 12 hour period , example 7 am to 7 pm, and STOP after  your last meal of the day. Drink water ,generally about 64 ounces per day, no other drink is as healthy. Fruit juice is best enjoyed in a healthy way, by EATING the fruit.  Thanks for choosing [facility] we consider it a privelige to serve you.

## 2024-08-29 NOTE — Progress Notes (Signed)
 "  Established Patient Office Visit  Subjective:  Patient ID: Tiffany Le, female    DOB: 06/18/1976  Age: 49 y.o. MRN: [id number]  CC:  Chief Complaint  Patient presents with   Hypertension   Fatigue    HPI  Discussed the use of AI scribe software for clinical note transcription with the patient, who gave verbal consent to proceed.  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  who presents for follow-up of her chronic medical conditions.  Blood pressure readings at home are generally less than 130/80 mmHg, but today's readings were 136/83 mmHg and 141/71 mmHg upon recheck. She is taking torsemide  20 mg twice daily, spironolactone  25 mg daily, and losartan  50 mg daily for blood pressure management.  For diabetes, she is on Farxiga  10 mg daily, glipizide  5 mg twice daily, and Ozempic  2 mg weekly. Her last A1c was 7.7, but blood sugar readings at home have been around 110-111 mg/dL in the mornings. She feels exhausted, experienced body pains, loss of appetite, diarrhea, and headaches since around Christmas, impacting her ability to work.  She recently started Repatha  injections last week for hyperlipidemia and is also taking Zetia  10 mg daily and rosuvastatin  40 mg daily. She had a rash prior to starting Repatha  but reports no issues since beginning the medication.  She is currently wearing a heart monitor, which she has been using for about a month, and has an  upcoming appointment with her cardiologist. She denies any bleeding.  She has not yet had her mammogram done this year but plans to schedule it soon.     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   Hyperlipidemia age 63   Hypertension age 18   Hypocalcemia 11/2019   Hypokalemia 11/2019   Iron deficiency anemia    NSTEMI (non-ST elevated myocardial infarction) (HCC) 09/2020   Obesity    Proteinuria 12/09/2019   Pulmonary embolism (HCC) 09/2020    Past Surgical History:  Procedure Laterality Date   APPLICATION OF WOUND VAC N/A 12/14/2020   Procedure: APPLICATION OF WOUND VAC;  Surgeon: Dusty Sudie DEL, MD;  Location: [facility] OR;  Service: Thoracic;  Laterality: N/A;   APPLICATION OF WOUND VAC N/A 12/25/2020   Procedure: WOUND VAC CHANGE;  Surgeon: German Bartlett PEDLAR, MD;  Location: [facility] OR;  Service: Thoracic;  Laterality: N/A;   CARDIAC CATHETERIZATION  09/25/2020   CESAREAN SECTION  3/96   CLIPPING OF ATRIAL APPENDAGE N/A 10/09/2020   Procedure: CLIPPING OF ATRIAL APPENDAGE USING ATRICURE CLIP;  Surgeon: German Bartlett PEDLAR, MD;  Location: [facility] OR;  Service: Open Heart Surgery;  Laterality: N/A;   CORONARY ARTERY BYPASS GRAFT N/A 10/09/2020   Procedure: CORONARY  ARTERY BYPASS GRAFTING (CABG), ON PUMP, TIMES FOUR, USING LEFT INTERNAL MAMMARY ARTERY AND LEFT RADIAL ARTERY (OPEN HARVEST);  Surgeon: German Bartlett PEDLAR, MD;  Location: [facility] OR;  Service: Open Heart Surgery;  Laterality: N/A;  POSSIBLE BIMA   INCISION AND DRAINAGE OF WOUND N/A 12/20/2020   Procedure: STERNAL WOUND DEBRIDEMENT WITH SKIN SUBSTITUTION AND ABRA;  Surgeon: Lowery Estefana RAMAN, DO;  Location: [facility] OR;   Service: Plastics;  Laterality: N/A;   IR THORACENTESIS RIGHT ASP PLEURAL SPACE W/IMG GUIDE  10/16/2020   LEFT HEART CATH AND CORONARY ANGIOGRAPHY N/A 09/25/2020   Procedure: LEFT HEART CATH AND CORONARY ANGIOGRAPHY;  Surgeon: Anner Alm ORN, MD;  Location: [facility] INVASIVE CV LAB;  Service: Cardiovascular;  Laterality: N/A;   ORIF FEMUR FRACTURE     RADIAL ARTERY HARVEST Left 10/09/2020   Procedure: RADIAL ARTERY HARVEST;  Surgeon: German Bartlett PEDLAR, MD;  Location: [facility] OR;  Service: Open Heart Surgery;  Laterality: Left;   STERNAL CLOSURE N/A 12/25/2020   Procedure: STERNAL CLOSURE;  Surgeon: German Bartlett PEDLAR, MD;  Location: [facility] OR;  Service: Thoracic;  Laterality: N/A;   STERNAL WOUND DEBRIDEMENT N/A 12/14/2020   Procedure: STERNAL WOUND DEBRIDEMENT;  Surgeon: Dusty Sudie DEL, MD;  Location: [facility] OR;  Service: Thoracic;  Laterality: N/A;   STERNAL WOUND DEBRIDEMENT N/A 12/18/2020   Procedure: STERNAL WOUND DEBRIDEMENT, Wound Vac Change;  Surgeon: German Bartlett PEDLAR, MD;  Location: [facility] OR;  Service: Thoracic;  Laterality: N/A;   STERNAL WOUND DEBRIDEMENT N/A 12/25/2020   Procedure: STERNAL WOUND IRRIGATION AND DEBRIDEMENT;  Surgeon: German Bartlett PEDLAR, MD;  Location: [facility] OR;  Service: Thoracic;  Laterality: N/A;   TEE WITHOUT CARDIOVERSION N/A 10/09/2020   Procedure: TRANSESOPHAGEAL ECHOCARDIOGRAM (TEE);  Surgeon: German Bartlett PEDLAR, MD;  Location: [facility] OR;  Service: Open Heart Surgery;  Laterality: N/A;    Family History  Problem Relation Age of Onset   Heart disease Mother        CABG <50   Hypertension Mother    Diabetes Mother    Hyperlipidemia Mother    Heart attack Mother    Heart disease Father    Hyperlipidemia Father    Hypertension Father    Early death Father    Healthy Daughter    Diabetes Maternal Aunt    Cancer Maternal Grandmother        stomach cancer   Diabetes Maternal Aunt    Cancer Cousin        colon cancer (dx'd 73)    Social History   Socioeconomic History   Marital  status: Single    Spouse name: Not on file   Number of children: Not on file   Years of education: 12   Highest education level: Some college, no degree  Occupational History   Occupation: estate manager/land agent  Tobacco Use   Smoking status: Never   Smokeless tobacco: Never  Vaping Use   Vaping status: Never Used  Substance and Sexual Activity   Alcohol use: No   Drug use: No   Sexual activity: Not Currently    Partners: Male    Comment: husband currently in [place], returning 02/2013  Other Topics Concern   Not on file  Social History Narrative   Works at a call center, 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  Lives with 47 year old daughter, 1 cat Husband is living in [place] (citizen there), trying to come to [place]  (has been delayed)   Social Drivers of Health  Tobacco Use: Low Risk (08/29/2024)   Patient History    Smoking Tobacco Use: Never    Smokeless Tobacco Use: Never    Passive Exposure: Not on file  Financial Resource Strain: Patient Declined (04/26/2024)   Overall Financial Resource Strain (CARDIA)    Difficulty of Paying Living Expenses: Patient declined  Food Insecurity: No Food Insecurity (08/29/2024)   Epic    Worried About Programme Researcher, Broadcasting/film/video in the Last Year: Never true    Ran Out of Food in the Last Year: Never true  Transportation Needs: No Transportation Needs (08/29/2024)   Epic    Lack of Transportation (Medical): No    Lack of Transportation (Non-Medical): No  Physical Activity: Unknown (11/23/2023)   Exercise Vital Sign    Days of Exercise per Week: 0 days    Minutes of Exercise per Session: Not on file  Stress: Stress Concern Present (11/23/2023)   [employer] of Occupational [employer] - Occupational Stress Questionnaire    Feeling of Stress : Very much  Social Connections: Unknown (04/26/2024)   Social Connection and Isolation Panel    Frequency of Communication with Friends and Family: Patient declined    Frequency of Social Gatherings with Friends  and Family: Patient declined    Attends Religious Services: Patient declined    Active Member of Clubs or Organizations: Patient declined    Attends Banker Meetings: Not on file    Marital Status: Patient declined  Intimate Partner Violence: Not At Risk (08/29/2024)   Epic    Fear of Current or Ex-Partner: No    Emotionally Abused: No    Physically Abused: No    Sexually Abused: No  Depression (PHQ2-9): Low Risk (08/29/2024)   Depression (PHQ2-9)    PHQ-2 Score: 0  Recent Concern: Depression (PHQ2-9) - Medium Risk (07/05/2024)   Depression (PHQ2-9)    PHQ-2 Score: 9  Alcohol Screen: Not on file  Housing: Low Risk (08/29/2024)   Epic    Unable to Pay for Housing in the Last Year: No    Number of Times Moved in the Last Year: 0    Homeless in the Last Year: No  Utilities: Not At Risk (08/29/2024)   Epic    Threatened with loss of utilities: No  Health Literacy: Not on file    Outpatient Medications Prior to Visit  Medication Sig Dispense Refill   albuterol  (PROVENTIL  HFA) 108 (90 Base) MCG/ACT inhaler Inhale 2 puffs into the lungs every 6 (six) hours as needed for wheezing. 8.5 g 12   aspirin  EC 81 MG tablet Take 1 tablet (81 mg total) by mouth daily. 90 tablet 1   Blood Glucose Monitoring Suppl (BLOOD GLUCOSE MONITOR SYSTEM) w/Device KIT Test in the morning, at noon, and at bedtime. 1 kit 0   Blood Pressure Monitoring (BLOOD PRESSURE CUFF) MISC Use as directed to monitor blood pressure 1 each 0   dapagliflozin  propanediol (FARXIGA ) 10 MG TABS tablet Take 1 tablet (10 mg total) by mouth daily. 90 tablet 3   escitalopram  (LEXAPRO ) 10 MG tablet Take 1 tablet (10 mg total) by mouth daily. 60 tablet 1   Evolocumab  (REPATHA  SURECLICK) 140 MG/ML SOAJ Inject 140 mg into the skin every 14 (fourteen) days. 6 mL 3   ezetimibe  (ZETIA ) 10 MG tablet Take 1 tablet (10 mg total) by mouth daily. 90 tablet 3   glipiZIDE  (GLUCOTROL ) 5 MG tablet TAKE 1 TABLET(5 MG) BY MOUTH TWICE DAILY  BEFORE A MEAL 180 tablet 1  Glucose Blood (BLOOD GLUCOSE TEST STRIPS) STRP Test in the morning, at noon, and at bedtime. 100 strip 0   losartan  (COZAAR ) 50 MG tablet Take 1 tablet (50 mg total) by mouth at bedtime. 90 tablet 2   nitroGLYCERIN  (NITROSTAT ) 0.4 MG SL tablet DISSOLVE 1 TABLET UNDER THE TONGUE EVERY 5 MINUTES FOR 3 DOSES AS NEEDED FOR CHEST PAIN 25 tablet 5   ondansetron  (ZOFRAN -ODT) 4 MG disintegrating tablet Take 1 tablet (4 mg total) by mouth every 8 (eight) hours as needed for nausea or vomiting. 10 tablet 0   rosuvastatin  (CRESTOR ) 20 MG tablet Take 2 tablets (40 mg total) by mouth daily. 180 tablet 3   Semaglutide , 2 MG/DOSE, (OZEMPIC , 2 MG/DOSE,) 8 MG/3ML SOPN Inject 2 mg into the skin once a week. 9 mL 3   spironolactone  (ALDACTONE ) 25 MG tablet Take 1 tablet (25 mg total) by mouth daily. 90 tablet 2   torsemide  (DEMADEX ) 20 MG tablet TAKE 1 TABLET(20 MG) BY MOUTH TWICE DAILY 60 tablet 3   traZODone  (DESYREL ) 50 MG tablet Take 0.5-1 tablets (25-50 mg total) by mouth at bedtime as needed for sleep. 30 tablet 3   methocarbamol  (ROBAXIN ) 500 MG tablet Take 1 tablet (500 mg total) by mouth every 8 (eight) hours as needed for muscle spasms. (Patient not taking: Reported on 08/29/2024) 21 tablet 0   triamcinolone  ointment (KENALOG ) 0.5 % Apply  twice daily to affected areas for 1 to 2 weeks. Do not use on the face (Patient not taking: Reported on 08/29/2024) 30 g 0   fluconazole  (DIFLUCAN ) 150 MG tablet Take 1 dose today and then a second dose 1 week following the first dose (Patient not taking: Reported on 08/29/2024) 2 tablet 0   methylPREDNISolone  (MEDROL  DOSEPAK) 4 MG TBPK tablet Take as directed on package (Patient not taking: Reported on 08/29/2024) 1 each 0   No facility-administered medications prior to visit.    Allergies[1]  ROS Review of Systems  Constitutional:  Positive for appetite change and fatigue. Negative for chills and fever.  HENT:  Negative for congestion,  postnasal drip and rhinorrhea.   Respiratory:  Negative for cough, shortness of breath and wheezing.   Cardiovascular:  Negative for chest pain, palpitations and leg swelling.  Gastrointestinal:  Negative for abdominal pain, anal bleeding and blood in stool.  Genitourinary:  Negative for difficulty urinating, dysuria, flank pain and frequency.  Musculoskeletal:  Positive for arthralgias. Negative for back pain, joint swelling and myalgias.  Skin:  Negative for color change, pallor, rash and wound.  Neurological:  Positive for headaches. Negative for weakness.  Psychiatric/Behavioral:  Negative for behavioral problems, confusion, self-injury and suicidal ideas.       Objective:    Physical Exam Vitals and nursing note reviewed.  Constitutional:      General: She is not in acute distress.    Appearance: Normal appearance. She is obese. She is not ill-appearing, toxic-appearing or diaphoretic.  Eyes:     General: No scleral icterus.       Right eye: No discharge.        Left eye: No discharge.     Extraocular Movements: Extraocular movements intact.     Conjunctiva/sclera: Conjunctivae normal.  Cardiovascular:     Rate and Rhythm: Normal rate and regular rhythm.     Pulses: Normal pulses.     Heart sounds: Normal heart sounds. No murmur heard.    No friction rub. No gallop.  Pulmonary:     Effort: Pulmonary  effort is normal. No respiratory distress.     Breath sounds: Normal breath sounds. No stridor. No wheezing, rhonchi or rales.  Chest:     Chest wall: No tenderness.  Abdominal:     General: There is no distension.     Palpations: Abdomen is soft.     Tenderness: There is no abdominal tenderness. There is no right CVA tenderness, left CVA tenderness or guarding.  Musculoskeletal:        General: No swelling, tenderness, deformity or signs of injury.     Right lower leg: No edema.     Left lower leg: No edema.  Skin:    General: Skin is warm and dry.     Capillary Refill:  Capillary refill takes less than 2 seconds.     Coloration: Skin is not jaundiced or pale.     Findings: No bruising, erythema or lesion.  Neurological:     Mental Status: She is alert and oriented to person, place, and time.     Motor: No weakness.     Gait: Gait normal.  Psychiatric:        Mood and Affect: Mood normal.        Behavior: Behavior normal.        Thought Content: Thought content normal.        Judgment: Judgment normal.     BP (!) 141/71   Pulse 68   Wt 259 lb (117.5 kg)   LMP 07/16/2024 (Approximate) Comment: Last through 06-27-2024  SpO2 100%   BMI 44.46 kg/m  Wt Readings from Last 3 Encounters:  08/29/24 259 lb (117.5 kg)  07/30/24 266 lb 15.6 oz (121.1 kg)  07/12/24 267 lb (121.1 kg)    Lab Results  Component Value Date   TSH 2.670 07/12/2024   Lab Results  Component Value Date   WBC 5.4 07/30/2024   HGB 10.7 (L) 07/30/2024   HCT 34.3 (L) 07/30/2024   MCV 90.0 07/30/2024   PLT 331 07/30/2024   Lab Results  Component Value Date   NA 137 07/30/2024   K 3.6 07/30/2024   CO2 27 07/30/2024   GLUCOSE 174 (H) 07/30/2024   BUN 14 07/30/2024   CREATININE 0.97 07/30/2024   BILITOT 0.3 07/30/2024   ALKPHOS 82 07/30/2024   AST 20 07/30/2024   ALT 18 07/30/2024   PROT 8.0 07/30/2024   ALBUMIN  4.0 07/30/2024   CALCIUM  9.2 07/30/2024   ANIONGAP 11 07/30/2024   EGFR 74 07/01/2024   Lab Results  Component Value Date   CHOL 103 06/21/2024   Lab Results  Component Value Date   HDL 31 (L) 06/21/2024   Lab Results  Component Value Date   LDLCALC 60 06/21/2024   Lab Results  Component Value Date   TRIG 47 06/21/2024   Lab Results  Component Value Date   CHOLHDL 3.3 06/21/2024   Lab Results  Component Value Date   HGBA1C 7.7 (A) 06/21/2024      Assessment & Plan:   Problem List Items Addressed This Visit       Cardiovascular and Mediastinum   Essential hypertension, benign (Chronic)        08/29/2024    3:09 PM 08/29/2024     3:02 PM 08/02/2024    9:51 AM 07/30/2024    8:12 PM 07/30/2024    4:28 PM 07/30/2024    3:54 PM 07/12/2024    1:38 PM  BP/Weight  Systolic BP 141 136 123 145  127 131  Diastolic BP 71 83 64 67  74 73  Wt. (Lbs)  259   266.98  267  BMI  44.46 kg/m2   45.83 kg/m2  45.83 kg/m2  Blood pressure is elevated in the office today but has been well-controlled at home continue torsemide  20 mg twice daily spironolactone  25 mg daily, losartan  50 mg daily Blood pressure goal is less than 130/80, advised to call the office with blood pressure readings consistently greater than 130/80 Continue current medications.  Recommended DASH diet and dietary sodium restrictions, moderate exercises at least 150 minutes weekly as tolerated              Chronic diastolic heart failure (HCC)   Continue torsemide  20 mg twice daily, spironolactone  25 mg daily, Farxiga  10 mg daily Maintain close follow-up with cardiology        Endocrine   Poorly controlled type 2 diabetes mellitus with neuropathy (HCC)   Lab Results  Component Value Date   HGBA1C 7.7 (A) 06/21/2024   A1c at 7.7%, above target. - Continue Farxiga  10 mg daily, glipizide  5 mg twice daily, and Ozempic   2 mg daily - Encouraged regular moderate exercise. - Advised dietary modifications to reduce sugar and sweets intake. - Recheck A1c in two months.         Other   Vitamin D  deficiency   Last vitamin D  Lab Results  Component Value Date   VD25OH 6.0 (L) 08/20/2021  Rechecking labs      Relevant Orders   VITAMIN D  25 Hydroxy (Vit-D Deficiency, Fractures)   Dyslipidemia   Lab Results  Component Value Date   CHOL 103 06/21/2024   HDL 31 (L) 06/21/2024   LDLCALC 60 06/21/2024   TRIG 47 06/21/2024   CHOLHDL 3.3 06/21/2024   Recently started on Repatha , awaiting cholesterol re-evaluation. - Continue Repatha  as prescribed. - Recheck cholesterol levels in two months.       Fatigue   Chronic condition probably related to her  heart failure Continue current medications and follow-up with cardiology      Relevant Orders   ANA   Rheumatoid factor   History of stroke   Continue aspirin  81 mg daily, Crestor  40 mg daily, Zetia  10 mg daily, Repatha  140 mg every 2 weeks      Elevated antinuclear antibody (ANA) level   Relevant Orders   Sedimentation Rate   ANA   Rheumatoid factor   Vitamin B12 deficiency   Lab Results  Component Value Date   VITAMINB12 267 07/12/2024  Neurology recommending B12 1000 mcg daily and folic acid  Will repeat labs at next visit      Anemia - Primary   Lab Results  Component Value Date   WBC 5.4 07/30/2024   HGB 10.7 (L) 07/30/2024   HCT 34.3 (L) 07/30/2024   MCV 90.0 07/30/2024   PLT 331 07/30/2024  Rechecking labs Could be due to her abnormal uterine bleeding Pelvic ultrasound ordered but has not been completed      Relevant Orders   CBC   Other Visit Diagnoses       Arthralgia, unspecified joint       Relevant Orders   Sedimentation Rate   ANA   Rheumatoid factor       No orders of the defined types were placed in this encounter.   Follow-up: Return in about 6 weeks (around 10/10/2024) for DM, HTN, HYPERLIPIDEMIA.    Tiffany Croghan R Derald Lorge, FNP     [1]  Allergies Allergen  Reactions   Oxycodone  Nausea And Vomiting and Other (See Comments)    Pt states she had dizziness   Penicillins Rash    As a baby   "

## 2024-08-29 NOTE — Assessment & Plan Note (Signed)
 Last vitamin D  Lab Results  Component Value Date   VD25OH 6.0 (L) 08/20/2021  Rechecking labs

## 2024-08-30 ENCOUNTER — Encounter: Payer: Self-pay | Admitting: Adult Health

## 2024-08-30 ENCOUNTER — Ambulatory Visit: Payer: Self-pay | Admitting: Nurse Practitioner

## 2024-08-30 DIAGNOSIS — D649 Anemia, unspecified: Secondary | ICD-10-CM

## 2024-08-30 LAB — CBC
Hematocrit: 33.5 % — ABNORMAL LOW (ref 34.0–46.6)
Hemoglobin: 10.8 g/dL — ABNORMAL LOW (ref 11.1–15.9)
MCH: 27.6 pg (ref 26.6–33.0)
MCHC: 32.2 g/dL (ref 31.5–35.7)
MCV: 86 fL (ref 79–97)
Platelets: 440 x10E3/uL (ref 150–450)
RBC: 3.92 x10E6/uL (ref 3.77–5.28)
RDW: 12.6 % (ref 11.7–15.4)
WBC: 6.5 x10E3/uL (ref 3.4–10.8)

## 2024-08-30 LAB — SEDIMENTATION RATE: Sed Rate: 62 mm/h — ABNORMAL HIGH (ref 0–32)

## 2024-08-30 LAB — SPECIMEN STATUS REPORT

## 2024-08-30 NOTE — Addendum Note (Signed)
 Addended by: JUANICE LORICE SAUNDERS on: 08/30/2024 09:40 AM   Modules accepted: Orders

## 2024-08-30 NOTE — Addendum Note (Signed)
 Addended by: JUANICE LORICE SAUNDERS on: 08/30/2024 09:38 AM   Modules accepted: Orders

## 2024-09-07 NOTE — Progress Notes (Unsigned)
 "  Cardiology Office Note:   Date:  09/08/2024  ID:  Tiffany Le, DOB 22-Oct-1975, MRN [id number] PCP:  Paseda, Folashade R, FNP  [facility] Providers Cardiologist:  Wendel Haws, MD Referring MD: Paseda, Folashade R, FNP  Chief Complaint/Reason for Referral: Diastolic heart failure, CAD follow-up ASSESSMENT:    1. Other fatigue   2. Chronic diastolic heart failure (HCC)   3. S/P CABG x 4   4. Type 2 diabetes mellitus with complication, with long-term current use of insulin  (HCC)   5. Hypertension associated with diabetes (HCC)   6. Hyperlipidemia associated with type 2 diabetes mellitus (HCC)   7. 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)   8. OSA (obstructive sleep apnea)   9. Body mass index (BMI) of 40.0-44.9 in adult [facility])      PLAN:   In order of problems listed above: Fatigue: With elevated ANA and ESR in addition to diffuse joint pains and fatigue to suggest may be a rheumatologic issue.  Have asked her to follow-up with her PCP about this.  Looks like repeat ANA is scheduled.  I will obtain an echocardiogram to evaluate as well. Chronic diastolic heart failure: Continue Farxiga  10 mg daily, losartan  50 mg daily, spironolactone  25 mg daily, torsemide  20 mg daily Status post CABG: Continue aspirin  81 mg daily, rosuvastatin  20 mg daily, Repatha  140 mg every 2 weeks.   Type 2 diabetes mellitus: Continue aspirin  81 mg daily, losartan  50 mg daily, rosuvastatin  20 mg daily, Farxiga  10 mg daily Hypertension: Continue losartan  50 mg daily, spironolactone  25 mg daily.  BP is well controlled today. Hyperlipidemia: Goal LDL is less than 55.  Continue Zetia  10 mg, Repatha  140 mg every 2 weeks, rosuvastatin  20 mg.  She is having problems with her Repatha  injections.  Have asked her to go to the pharmacy downstairs to speak with them about how to use the injection device.  Check lipid panel, LFTs, LP(a) in 2 months.   If LDL is at goal we will  check hs-CRP if LDL is at goal will check hs-CRP History of stroke: Continue aspirin  81 mg daily, rosuvastatin  20 mg daily, and aggressive blood pressure control. OSA: Continue CPAP Elevated BMI: Continue Ozempic  2 mg q. weekly            Dispo:  Return in about 6 months (around 03/08/2025).      Medication Adjustments/Labs and Tests Ordered: Current medicines are reviewed at length with the patient today.  Concerns regarding medicines are outlined above.  The following changes have been made:     Labs/tests ordered: Orders Placed This Encounter  Procedures   Hepatic function panel   Lipid panel   Lipoprotein A (LPA)   ECHOCARDIOGRAM COMPLETE    Medication Changes: No orders of the defined types were placed in this encounter.   Current medicines are reviewed at length with the patient today.  The patient does not have concerns regarding medicines.  I spent 41 minutes reviewing all clinical data during and prior to this visit including all relevant imaging studies, laboratories, clinical information from other health systems and prior notes from both Cardiology and other specialties, interviewing the patient, conducting a complete physical examination, and coordinating care in order to formulate a comprehensive and personalized evaluation and treatment plan.   History of Present Illness:      FOCUSED PROBLEM LIST:   CAD ACS > LIMA to LAD, RIMA to PDA, left radial to OM1 to  OM2 +LAA clipping March 2022 Sternal wound dehiscence> debridement Diastolic dysfunction LVH TTE 2024 Type 2 diabetes on insulin  Neuropathy Hypertension LVH TTE 2024 Hyperlipidemia Goal LDL less than 55 > ACS/CVA History of stroke MRI remote lacunar infarct right thalamus and subacute ischemic infarctions bilateral cerebral white matter and pons Bubble study TTE 2023 negative Mild OSA On CPAP BMI 44   2/23: The patient is a 49 y.o. female with the indicated medical history here for fatigue  and to establish cardiovascular care.  She saw her primary care provider recently.  She denied any chest pain or shortness of breath.  Recent lab work was unremarkable.  A TSH was drawn in October 2022 which was unremarkable.  Echocardiogram was ordered which demonstrated normal LV function with no significant valvular abnormalities and grade 2 diastolic dysfunction.     The patient tells me that she has had sharp shooting chest discomfort that lasts seconds.  This happens when she carries her work bag.  She has no other types of chest pain.  She denies any significant shortness of breath, orthopnea, paroxysmal nocturnal dyspnea, palpitations, or signs or symptoms of recurrent stroke.  She notes that she has been tired.  She did not participate in cardiac rehabilitation postoperatively due to have to think to do intensive physical therapy because her left arm was very weak.  Her biggest issue is with neuropathic bilateral leg pain.  Plan: Refer to cardiac rehabilitation, plan on stopping Plavix  March 2023, refer to pharmacy regarding medical therapy for elevated BMI, stop Entresto  given normal LV function and start losartan , changed to Crestor  20 mg and obtain sleep study.  March 2023:  The patient returns for expedited follow-up due to increasing shortness of breath.  She noticed increasing peripheral edema and says that she gained 30 pounds.  She was also constipated.  Once her constipation was resolved her peripheral edema seem to improve.  Today she feels very well.  She denies any shortness of breath, chest pain, palpitations, paroxysmal nocturnal dyspnea, orthopnea.  In fact the only way she can sleep is absolutely flat.  She has required no emergency room visits or hospitalizations.  Given a history of unexplained stroke she was referred for a bubble study echocardiogram, Plavix  was stopped and aspirin  monotherapy continued due to history of CABG required.  Due to her ejection fraction being normal and  her losartan  was in creased to 50 mg.  Plan: Stop spironolactone  and increase losartan  to 50 mg, stop Coreg  given normal ejection fraction, stop Plavix , obtain bubble study echocardiogram given history of stroke, and refer to pharmacy for recommendations regarding elevated BMI.  12/24:  The patient was seen in April by general cardiology.  At that point in time the patient endorsed shortness of breath.  Her torsemide  was continued.  Echocardiogram was pursued which demonstrated normal LV function and mild concentric left ventricular hypertrophy.  The patient's biggest complaint today is fatigue.  She finds that she will be sleepy during the day.  We have previously referred her for sleep study but this was not performed.  She is otherwise doing well from a cardiovascular standpoint.  Her breathing is stable.  She denies any peripheral edema or paroxysmal nocturnal dyspnea.  She has not required any emergency room visits or hospitalizations.  Denies any severe bleeding or palpitations.  She has not required any nitroglycerin  use.  She is tolerating her medications well.  Plan:  Start spironolactone  25mg , check lipds/LFTs/LpA, sleep study, CMP, CBC, reflex TSH.  January  2026: Patient consents to use of AI scribe. In the interim LP(a) was not checked.  Her LDL in November 2025 was 60.  Sleep study demonstrated mild sleep apnea she was seen in November 2025.  She was doing well and no changes were made to her medical regimen.  Her blood pressure was well-controlled.  She has been experiencing significant fatigue since before Christmas, described as 'tremendous.' Despite using her CPAP machine, the fatigue persists. She has consulted her primary care physician multiple times and has visited urgent care and the emergency room due to these symptoms.  She reports widespread body aches, headaches, and joint pain affecting various joints including her knees, feet, and fingers. The pain is severe enough at times to  prevent her from performing daily activities, such as fastening her bra. She describes the joint pain as occurring 'from nowhere' and affecting different joints at different times.  She experiences daily episodes of swelling in various parts of her body, such as her lips and fingers. She had an allergic reaction to an unspecified medication and was treated with steroids in the emergency room, which suppressed the symptoms temporarily.  Her past medical history includes a stroke last year, for which she is scheduled to have a CT scan of her head and neck. She is currently wearing a heart monitor. Recent blood tests included an elevated sedimentation rate and a positive ANA, while tests for Lyme disease and rheumatoid factor were negative. She has not yet seen a rheumatologist.  She mentions difficulty using Repatha , a medication prescribed for her cholesterol, as she was unable to properly administer the injection despite watching instructional videos.         Current Medications: Current Meds  Medication Sig   albuterol  (PROVENTIL  HFA) 108 (90 Base) MCG/ACT inhaler Inhale 2 puffs into the lungs every 6 (six) hours as needed for wheezing.   aspirin  EC 81 MG tablet Take 1 tablet (81 mg total) by mouth daily.   Blood Glucose Monitoring Suppl (BLOOD GLUCOSE MONITOR SYSTEM) w/Device KIT Test in the morning, at noon, and at bedtime.   Blood Pressure Monitoring (BLOOD PRESSURE CUFF) MISC Use as directed to monitor blood pressure   dapagliflozin  propanediol (FARXIGA ) 10 MG TABS tablet Take 1 tablet (10 mg total) by mouth daily.   escitalopram  (LEXAPRO ) 10 MG tablet Take 1 tablet (10 mg total) by mouth daily.   Evolocumab  (REPATHA  SURECLICK) 140 MG/ML SOAJ Inject 140 mg into the skin every 14 (fourteen) days.   ezetimibe  (ZETIA ) 10 MG tablet Take 1 tablet (10 mg total) by mouth daily.   glipiZIDE  (GLUCOTROL ) 5 MG tablet TAKE 1 TABLET(5 MG) BY MOUTH TWICE DAILY BEFORE A MEAL   Glucose Blood (BLOOD  GLUCOSE TEST STRIPS) STRP Test in the morning, at noon, and at bedtime.   losartan  (COZAAR ) 50 MG tablet Take 1 tablet (50 mg total) by mouth at bedtime.   methocarbamol  (ROBAXIN ) 500 MG tablet Take 1 tablet (500 mg total) by mouth every 8 (eight) hours as needed for muscle spasms.   nitroGLYCERIN  (NITROSTAT ) 0.4 MG SL tablet DISSOLVE 1 TABLET UNDER THE TONGUE EVERY 5 MINUTES FOR 3 DOSES AS NEEDED FOR CHEST PAIN   ondansetron  (ZOFRAN -ODT) 4 MG disintegrating tablet Take 1 tablet (4 mg total) by mouth every 8 (eight) hours as needed for nausea or vomiting.   rosuvastatin  (CRESTOR ) 20 MG tablet Take 2 tablets (40 mg total) by mouth daily.   Semaglutide , 2 MG/DOSE, (OZEMPIC , 2 MG/DOSE,) 8 MG/3ML SOPN Inject 2  mg into the skin once a week.   spironolactone  (ALDACTONE ) 25 MG tablet Take 1 tablet (25 mg total) by mouth daily.   torsemide  (DEMADEX ) 20 MG tablet TAKE 1 TABLET(20 MG) BY MOUTH TWICE DAILY   traZODone  (DESYREL ) 50 MG tablet Take 0.5-1 tablets (25-50 mg total) by mouth at bedtime as needed for sleep.   triamcinolone  ointment (KENALOG ) 0.5 % Apply  twice daily to affected areas for 1 to 2 weeks. Do not use on the face     Review of Systems:   Please see the history of present illness.    All other systems reviewed and are negative.     EKGs/Labs/Other Test Reviewed:   EKG: November 2025 sinus rhythm with lateral T wave abnormality  EKG Interpretation Date/Time:    Ventricular Rate:    PR Interval:    QRS Duration:    QT Interval:    QTC Calculation:   R Axis:      Text Interpretation:           Risk Assessment/Calculations:     STOP-Bang Score:          Physical Exam:   VS:  BP 126/70   Pulse 84   Ht 5' 4 (1.626 m)   Wt 260 lb (117.9 kg)   BMI 44.63 kg/m        Wt Readings from Last 3 Encounters:  09/08/24 260 lb (117.9 kg)  08/29/24 259 lb (117.5 kg)  07/30/24 266 lb 15.6 oz (121.1 kg)      GENERAL:  No apparent distress, AOx3 HEENT:  No carotid bruits,  +2 carotid impulses, no scleral icterus CAR: RRR no murmurs, gallops, rubs, or thrills RES:  Clear to auscultation bilaterally ABD:  Soft, nontender, nondistended, positive bowel sounds x 4 VASC:  +2 radial pulses, +2 carotid pulses NEURO:  CN 2-12 grossly intact; motor and sensory grossly intact PSYCH:  No active depression or anxiety EXT:  No edema, ecchymosis, or cyanosis  Signed, Kaesyn Johnston K Nelwyn Hebdon, MD  09/08/2024 12:37 PM    [facility] [address] [place], [place], [place]  [address] Phone: [phone]; Fax: [phone]   Note:  This document was prepared using Dragon voice recognition software and may include unintentional dictation errors. "

## 2024-09-08 ENCOUNTER — Ambulatory Visit: Attending: Internal Medicine | Admitting: Internal Medicine

## 2024-09-08 ENCOUNTER — Encounter: Payer: Self-pay | Admitting: Internal Medicine

## 2024-09-08 VITALS — BP 126/70 | HR 84 | Ht 64.0 in | Wt 260.0 lb

## 2024-09-08 DIAGNOSIS — Z951 Presence of aortocoronary bypass graft: Secondary | ICD-10-CM | POA: Insufficient documentation

## 2024-09-08 DIAGNOSIS — E118 Type 2 diabetes mellitus with unspecified complications: Secondary | ICD-10-CM | POA: Insufficient documentation

## 2024-09-08 DIAGNOSIS — I63133 Cerebral infarction due to embolism of bilateral carotid arteries: Secondary | ICD-10-CM | POA: Insufficient documentation

## 2024-09-08 DIAGNOSIS — E1159 Type 2 diabetes mellitus with other circulatory complications: Secondary | ICD-10-CM | POA: Diagnosis present

## 2024-09-08 DIAGNOSIS — Z6841 Body Mass Index (BMI) 40.0 and over, adult: Secondary | ICD-10-CM | POA: Insufficient documentation

## 2024-09-08 DIAGNOSIS — I5032 Chronic diastolic (congestive) heart failure: Secondary | ICD-10-CM | POA: Diagnosis present

## 2024-09-08 DIAGNOSIS — I152 Hypertension secondary to endocrine disorders: Secondary | ICD-10-CM | POA: Diagnosis present

## 2024-09-08 DIAGNOSIS — Z794 Long term (current) use of insulin: Secondary | ICD-10-CM | POA: Insufficient documentation

## 2024-09-08 DIAGNOSIS — G4733 Obstructive sleep apnea (adult) (pediatric): Secondary | ICD-10-CM | POA: Diagnosis present

## 2024-09-08 DIAGNOSIS — E785 Hyperlipidemia, unspecified: Secondary | ICD-10-CM | POA: Insufficient documentation

## 2024-09-08 DIAGNOSIS — E1169 Type 2 diabetes mellitus with other specified complication: Secondary | ICD-10-CM | POA: Insufficient documentation

## 2024-09-08 DIAGNOSIS — R5383 Other fatigue: Secondary | ICD-10-CM | POA: Insufficient documentation

## 2024-09-08 NOTE — Patient Instructions (Addendum)
 Medication Instructions:  No changes    Go to pharmacy on first to discuss Repatha   *If you need a refill on your cardiac medications before your next appointment, please call your pharmacy*   Lab Work: in 2 months LPa LIPID Liver function panel  If you have labs (blood work) drawn today and your tests are completely normal, you will receive your results only by: MyChart Message (if you have MyChart) OR A paper copy in the mail If you have any lab test that is abnormal or we need to change your treatment, we will call you to review the results.   Testing/Procedures:  Your physician has requested that you have an echocardiogram. Echocardiography is a painless test that uses sound waves to create images of your heart. It provides your doctor with information about the size and shape of your heart and how well your hearts chambers and valves are working. This procedure takes approximately one hour. There are no restrictions for this procedure. Please do NOT wear cologne, perfume, aftershave, or lotions (deodorant is allowed). Please arrive 15 minutes prior to your appointment time.  Please note: We ask at that you not bring children with you during ultrasound (echo/ vascular) testing. Due to room size and safety concerns, children are not allowed in the ultrasound rooms during exams. Our front office staff cannot provide observation of children in our lobby area while testing is being conducted. An adult accompanying a patient to their appointment will only be allowed in the ultrasound room at the discretion of the ultrasound technician under special circumstances. We apologize for any inconvenience.   Follow-Up: At [facility], you and your health needs are our priority.  As part of our continuing mission to provide you with exceptional heart care, we have created designated Provider Care Teams.  These Care Teams include your primary Cardiologist (physician) and Advanced Practice  Providers (APPs -  Physician Assistants and Nurse Practitioners) who all work together to provide you with the care you need, when you need it.     Your next appointment:   6 month(s)  The format for your next appointment:   In Person  Provider:   Glendia Ferrier, PA-C

## 2024-09-09 ENCOUNTER — Other Ambulatory Visit

## 2024-09-13 ENCOUNTER — Encounter: Payer: Self-pay | Admitting: Internal Medicine

## 2024-09-13 DIAGNOSIS — R5383 Other fatigue: Secondary | ICD-10-CM

## 2024-09-14 ENCOUNTER — Ambulatory Visit

## 2024-09-14 ENCOUNTER — Ambulatory Visit: Payer: Self-pay | Admitting: Internal Medicine

## 2024-09-14 DIAGNOSIS — I63133 Cerebral infarction due to embolism of bilateral carotid arteries: Secondary | ICD-10-CM

## 2024-09-14 MED ORDER — METOPROLOL SUCCINATE ER 25 MG PO TB24: 12.5000 mg | ORAL_TABLET | Freq: Every day | ORAL | 3 refills | Status: AC

## 2024-09-19 ENCOUNTER — Ambulatory Visit: Admitting: Nurse Practitioner

## 2024-09-19 ENCOUNTER — Inpatient Hospital Stay: Admission: RE | Admit: 2024-09-19

## 2024-09-21 ENCOUNTER — Ambulatory Visit: Payer: Self-pay

## 2024-09-26 ENCOUNTER — Telehealth: Payer: Self-pay | Admitting: Licensed Clinical Social Worker

## 2024-10-04 ENCOUNTER — Ambulatory Visit: Payer: Self-pay | Admitting: Nurse Practitioner

## 2024-10-05 ENCOUNTER — Ambulatory Visit (HOSPITAL_COMMUNITY)

## 2024-10-10 ENCOUNTER — Ambulatory Visit: Payer: Self-pay | Admitting: Nurse Practitioner

## 2024-11-10 ENCOUNTER — Ambulatory Visit

## 2025-02-14 ENCOUNTER — Ambulatory Visit: Admitting: Physician Assistant
